# Patient Record
Sex: Female | Born: 1945
Health system: Southern US, Community
[De-identification: ages and names within clinical notes are randomized; demographics above are authoritative.]

## PROBLEM LIST (undated history)

## (undated) DIAGNOSIS — Z923 Personal history of irradiation: Secondary | ICD-10-CM

## (undated) DIAGNOSIS — C801 Malignant (primary) neoplasm, unspecified: Secondary | ICD-10-CM

## (undated) DIAGNOSIS — J449 Chronic obstructive pulmonary disease, unspecified: Secondary | ICD-10-CM

## (undated) DIAGNOSIS — I2510 Atherosclerotic heart disease of native coronary artery without angina pectoris: Secondary | ICD-10-CM

## (undated) DIAGNOSIS — C3490 Malignant neoplasm of unspecified part of unspecified bronchus or lung: Secondary | ICD-10-CM

## (undated) DIAGNOSIS — I10 Essential (primary) hypertension: Secondary | ICD-10-CM

## (undated) DIAGNOSIS — E039 Hypothyroidism, unspecified: Secondary | ICD-10-CM

## (undated) DIAGNOSIS — Z720 Tobacco use: Secondary | ICD-10-CM

## (undated) DIAGNOSIS — K219 Gastro-esophageal reflux disease without esophagitis: Secondary | ICD-10-CM

## (undated) DIAGNOSIS — E785 Hyperlipidemia, unspecified: Secondary | ICD-10-CM

## (undated) DIAGNOSIS — I219 Acute myocardial infarction, unspecified: Secondary | ICD-10-CM

## (undated) HISTORY — DX: Gastro-esophageal reflux disease without esophagitis: K21.9

## (undated) HISTORY — DX: Hyperlipidemia, unspecified: E78.5

## (undated) HISTORY — DX: Malignant (primary) neoplasm, unspecified: C80.1

## (undated) HISTORY — DX: Malignant neoplasm of unspecified part of unspecified bronchus or lung: C34.90

## (undated) HISTORY — DX: Acute myocardial infarction, unspecified: I21.9

## (undated) HISTORY — DX: Essential (primary) hypertension: I10

## (undated) HISTORY — DX: Tobacco use: Z72.0

## (undated) HISTORY — DX: Atherosclerotic heart disease of native coronary artery without angina pectoris: I25.10

---

## 2001-11-16 ENCOUNTER — Other Ambulatory Visit: Admission: RE | Admit: 2001-11-16 | Discharge: 2001-11-16 | Payer: Self-pay | Admitting: Obstetrics and Gynecology

## 2003-07-12 ENCOUNTER — Other Ambulatory Visit: Admission: RE | Admit: 2003-07-12 | Discharge: 2003-07-12 | Payer: Self-pay | Admitting: Obstetrics and Gynecology

## 2005-04-11 ENCOUNTER — Other Ambulatory Visit: Admission: RE | Admit: 2005-04-11 | Discharge: 2005-04-11 | Payer: Self-pay | Admitting: Obstetrics and Gynecology

## 2010-07-06 ENCOUNTER — Other Ambulatory Visit: Payer: Self-pay | Admitting: Thoracic Surgery (Cardiothoracic Vascular Surgery)

## 2010-07-06 ENCOUNTER — Inpatient Hospital Stay (HOSPITAL_COMMUNITY)
Admission: EM | Admit: 2010-07-06 | Discharge: 2010-07-12 | DRG: 546 | Disposition: A | Discharge status: Home / Self Care | Payer: BC Managed Care – PPO | Source: Ambulatory Visit | Attending: Thoracic Surgery (Cardiothoracic Vascular Surgery) | Admitting: Thoracic Surgery (Cardiothoracic Vascular Surgery)

## 2010-07-06 ENCOUNTER — Inpatient Hospital Stay (HOSPITAL_COMMUNITY): Payer: BC Managed Care – PPO

## 2010-07-06 ENCOUNTER — Emergency Department (HOSPITAL_COMMUNITY): Payer: BC Managed Care – PPO

## 2010-07-06 DIAGNOSIS — R0609 Other forms of dyspnea: Secondary | ICD-10-CM | POA: Diagnosis present

## 2010-07-06 DIAGNOSIS — I214 Non-ST elevation (NSTEMI) myocardial infarction: Principal | ICD-10-CM | POA: Diagnosis present

## 2010-07-06 DIAGNOSIS — E876 Hypokalemia: Secondary | ICD-10-CM | POA: Diagnosis not present

## 2010-07-06 DIAGNOSIS — E669 Obesity, unspecified: Secondary | ICD-10-CM | POA: Diagnosis present

## 2010-07-06 DIAGNOSIS — K219 Gastro-esophageal reflux disease without esophagitis: Secondary | ICD-10-CM | POA: Diagnosis present

## 2010-07-06 DIAGNOSIS — I252 Old myocardial infarction: Secondary | ICD-10-CM

## 2010-07-06 DIAGNOSIS — F17200 Nicotine dependence, unspecified, uncomplicated: Secondary | ICD-10-CM | POA: Diagnosis present

## 2010-07-06 DIAGNOSIS — Z7982 Long term (current) use of aspirin: Secondary | ICD-10-CM

## 2010-07-06 DIAGNOSIS — D62 Acute posthemorrhagic anemia: Secondary | ICD-10-CM | POA: Diagnosis not present

## 2010-07-06 DIAGNOSIS — I509 Heart failure, unspecified: Secondary | ICD-10-CM | POA: Diagnosis present

## 2010-07-06 DIAGNOSIS — R911 Solitary pulmonary nodule: Secondary | ICD-10-CM | POA: Diagnosis present

## 2010-07-06 DIAGNOSIS — R0989 Other specified symptoms and signs involving the circulatory and respiratory systems: Secondary | ICD-10-CM | POA: Diagnosis present

## 2010-07-06 DIAGNOSIS — E785 Hyperlipidemia, unspecified: Secondary | ICD-10-CM | POA: Diagnosis present

## 2010-07-06 DIAGNOSIS — I2510 Atherosclerotic heart disease of native coronary artery without angina pectoris: Secondary | ICD-10-CM | POA: Diagnosis present

## 2010-07-06 DIAGNOSIS — I5041 Acute combined systolic (congestive) and diastolic (congestive) heart failure: Secondary | ICD-10-CM | POA: Diagnosis not present

## 2010-07-06 HISTORY — PX: CARDIAC CATHETERIZATION: SHX172

## 2010-07-06 HISTORY — PX: TEE WITHOUT CARDIOVERSION: SHX5443

## 2010-07-06 LAB — POCT I-STAT 4, (NA,K, GLUC, HGB,HCT)
Glucose, Bld: 131 mg/dL — ABNORMAL HIGH (ref 70–99)
Glucose, Bld: 114 mg/dL — ABNORMAL HIGH (ref 70–99)
Glucose, Bld: 130 mg/dL — ABNORMAL HIGH (ref 70–99)
Glucose, Bld: 128 mg/dL — ABNORMAL HIGH (ref 70–99)
HCT: 24 % — ABNORMAL LOW (ref 36.0–46.0)
HCT: 29 % — ABNORMAL LOW (ref 36.0–46.0)
HCT: 23 % — ABNORMAL LOW (ref 36.0–46.0)
HCT: 20 % — ABNORMAL LOW (ref 36.0–46.0)
HCT: 39 % (ref 36.0–46.0)
Hemoglobin: 11.6 g/dL — ABNORMAL LOW (ref 12.0–15.0)
Hemoglobin: 9.9 g/dL — ABNORMAL LOW (ref 12.0–15.0)
Hemoglobin: 13.3 g/dL (ref 12.0–15.0)
Potassium: 4.5 mEq/L (ref 3.5–5.1)
Potassium: 4 mEq/L (ref 3.5–5.1)
Potassium: 5.3 mEq/L — ABNORMAL HIGH (ref 3.5–5.1)
Potassium: 5.1 mEq/L (ref 3.5–5.1)
Sodium: 137 mEq/L (ref 135–145)
Sodium: 137 mEq/L (ref 135–145)
Sodium: 134 mEq/L — ABNORMAL LOW (ref 135–145)

## 2010-07-06 LAB — POCT I-STAT 3, ART BLOOD GAS (G3+)
Acid-base deficit: 4 mmol/L — ABNORMAL HIGH (ref 0.0–2.0)
Acid-base deficit: 4 mmol/L — ABNORMAL HIGH (ref 0.0–2.0)
Bicarbonate: 22.3 mEq/L (ref 20.0–24.0)
Bicarbonate: 20.8 mEq/L (ref 20.0–24.0)
Bicarbonate: 23.2 mEq/L (ref 20.0–24.0)
O2 Saturation: 100 %
O2 Saturation: 98 %
TCO2: 26 mmol/L (ref 0–100)
TCO2: 22 mmol/L (ref 0–100)
TCO2: 22 mmol/L (ref 0–100)
TCO2: 25 mmol/L (ref 0–100)
pCO2 arterial: 44.5 mmHg (ref 35.0–45.0)
pCO2 arterial: 45.1 mmHg — ABNORMAL HIGH (ref 35.0–45.0)
pCO2 arterial: 51.2 mmHg — ABNORMAL HIGH (ref 35.0–45.0)
pH, Arterial: 7.37 (ref 7.350–7.400)
pH, Arterial: 7.327 — ABNORMAL LOW (ref 7.350–7.400)
pH, Arterial: 7.32 — ABNORMAL LOW (ref 7.350–7.400)
pH, Arterial: 7.274 — ABNORMAL LOW (ref 7.350–7.400)
pO2, Arterial: 65 mmHg — ABNORMAL LOW (ref 80.0–100.0)
pO2, Arterial: 103 mmHg — ABNORMAL HIGH (ref 80.0–100.0)
pO2, Arterial: 75 mmHg — ABNORMAL LOW (ref 80.0–100.0)
pO2, Arterial: 78 mmHg — ABNORMAL LOW (ref 80.0–100.0)

## 2010-07-06 LAB — CBC
HCT: 32.2 % — ABNORMAL LOW (ref 36.0–46.0)
Hemoglobin: 10.8 g/dL — ABNORMAL LOW (ref 12.0–15.0)
Hemoglobin: 12.7 g/dL (ref 12.0–15.0)
MCH: 29.1 pg (ref 26.0–34.0)
MCHC: 33.5 g/dL (ref 30.0–36.0)
MCHC: 33.5 g/dL (ref 30.0–36.0)
Platelets: 134 10*3/uL — ABNORMAL LOW (ref 150–400)
RBC: 4.35 MIL/uL (ref 3.87–5.11)
RDW: 13.9 % (ref 11.5–15.5)

## 2010-07-06 LAB — POCT I-STAT 3, VENOUS BLOOD GAS (G3P V)
Acid-base deficit: 4 mmol/L — ABNORMAL HIGH (ref 0.0–2.0)
Bicarbonate: 22.6 mEq/L (ref 20.0–24.0)
TCO2: 24 mmol/L (ref 0–100)

## 2010-07-06 LAB — CARDIAC PANEL(CRET KIN+CKTOT+MB+TROPI)
CK, MB: 4.1 ng/mL — ABNORMAL HIGH (ref 0.3–4.0)
Relative Index: INVALID (ref 0.0–2.5)
Total CK: 98 U/L (ref 7–177)

## 2010-07-06 LAB — HEMOGLOBIN AND HEMATOCRIT, BLOOD: HCT: 21.9 % — ABNORMAL LOW (ref 36.0–46.0)

## 2010-07-06 LAB — COMPREHENSIVE METABOLIC PANEL
ALT: 42 U/L — ABNORMAL HIGH (ref 0–35)
Albumin: 3 g/dL — ABNORMAL LOW (ref 3.5–5.2)
Alkaline Phosphatase: 75 U/L (ref 39–117)
Calcium: 8.6 mg/dL (ref 8.4–10.5)
Potassium: 3.9 mEq/L (ref 3.5–5.1)
Sodium: 135 mEq/L (ref 135–145)
Total Protein: 6.2 g/dL (ref 6.0–8.3)

## 2010-07-06 LAB — POCT I-STAT, CHEM 8
Calcium, Ion: 1.12 mmol/L (ref 1.12–1.32)
Chloride: 105 mEq/L (ref 96–112)
HCT: 39 % (ref 36.0–46.0)
TCO2: 22 mmol/L (ref 0–100)

## 2010-07-06 LAB — POCT CARDIAC MARKERS: Myoglobin, poc: 94.1 ng/mL (ref 12–200)

## 2010-07-06 LAB — DIFFERENTIAL
Eosinophils Absolute: 0 10*3/uL (ref 0.0–0.7)
Eosinophils Relative: 0 % (ref 0–5)
Monocytes Absolute: 1.7 10*3/uL — ABNORMAL HIGH (ref 0.1–1.0)
Neutrophils Relative %: 78 % — ABNORMAL HIGH (ref 43–77)

## 2010-07-06 LAB — PROTIME-INR
INR: 1.03 (ref 0.00–1.49)
INR: 1.52 — ABNORMAL HIGH (ref 0.00–1.49)
Prothrombin Time: 18.5 seconds — ABNORMAL HIGH (ref 11.6–15.2)

## 2010-07-07 ENCOUNTER — Inpatient Hospital Stay (HOSPITAL_COMMUNITY): Payer: BC Managed Care – PPO

## 2010-07-07 LAB — GLUCOSE, CAPILLARY
Glucose-Capillary: 113 mg/dL — ABNORMAL HIGH (ref 70–99)
Glucose-Capillary: 117 mg/dL — ABNORMAL HIGH (ref 70–99)
Glucose-Capillary: 74 mg/dL (ref 70–99)
Glucose-Capillary: 83 mg/dL (ref 70–99)
Glucose-Capillary: 94 mg/dL (ref 70–99)
Glucose-Capillary: 95 mg/dL (ref 70–99)
Glucose-Capillary: 156 mg/dL — ABNORMAL HIGH (ref 70–99)

## 2010-07-07 LAB — BASIC METABOLIC PANEL
BUN: 17 mg/dL (ref 6–23)
CO2: 24 mEq/L (ref 19–32)
Chloride: 108 mEq/L (ref 96–112)
GFR calc non Af Amer: >60 mL/min (ref >60)
Glucose, Bld: 103 mg/dL — ABNORMAL HIGH (ref 70–99)
Potassium: 4.3 mEq/L (ref 3.5–5.1)
Sodium: 139 mEq/L (ref 135–145)

## 2010-07-07 LAB — POCT I-STAT 3, ART BLOOD GAS (G3+)
Acid-base deficit: 3 mmol/L — ABNORMAL HIGH (ref 0.0–2.0)
Acid-base deficit: 1 mmol/L (ref 0.0–2.0)
Bicarbonate: 22.5 mEq/L (ref 20.0–24.0)
Bicarbonate: 22.8 mEq/L (ref 20.0–24.0)
O2 Saturation: 93 %
O2 Saturation: 91 %
Patient temperature: 38.3
pCO2 arterial: 40.9 mmHg (ref 35.0–45.0)
pO2, Arterial: 75 mmHg — ABNORMAL LOW (ref 80.0–100.0)

## 2010-07-07 LAB — POCT I-STAT, CHEM 8
BUN: 22 mg/dL (ref 6–23)
Calcium, Ion: 1.14 mmol/L (ref 1.12–1.32)
Chloride: 105 mEq/L (ref 96–112)
Potassium: 4 mEq/L (ref 3.5–5.1)
Sodium: 136 mEq/L (ref 135–145)

## 2010-07-07 LAB — CREATININE, SERUM
Creatinine, Ser: 1.21 mg/dL — ABNORMAL HIGH (ref 0.4–1.2)
GFR calc non Af Amer: 45 mL/min — ABNORMAL LOW (ref >60)

## 2010-07-07 LAB — CBC
HCT: 34.2 % — ABNORMAL LOW (ref 36.0–46.0)
HCT: 33.5 % — ABNORMAL LOW (ref 36.0–46.0)
Hemoglobin: 11.8 g/dL — ABNORMAL LOW (ref 12.0–15.0)
Hemoglobin: 11.1 g/dL — ABNORMAL LOW (ref 12.0–15.0)
MCV: 86.4 fL (ref 78.0–100.0)
MCV: 87.2 fL (ref 78.0–100.0)
RBC: 3.84 MIL/uL — ABNORMAL LOW (ref 3.87–5.11)
RDW: 14.6 % (ref 11.5–15.5)
WBC: 19.5 10*3/uL — ABNORMAL HIGH (ref 4.0–10.5)
WBC: 20.2 10*3/uL — ABNORMAL HIGH (ref 4.0–10.5)

## 2010-07-07 LAB — POCT ACTIVATED CLOTTING TIME: Activated Clotting Time: 140 seconds

## 2010-07-07 LAB — MAGNESIUM: Magnesium: 2.9 mg/dL — ABNORMAL HIGH (ref 1.5–2.5)

## 2010-07-08 ENCOUNTER — Inpatient Hospital Stay (HOSPITAL_COMMUNITY): Payer: BC Managed Care – PPO

## 2010-07-08 LAB — GLUCOSE, CAPILLARY
Glucose-Capillary: 118 mg/dL — ABNORMAL HIGH (ref 70–99)
Glucose-Capillary: 128 mg/dL — ABNORMAL HIGH (ref 70–99)
Glucose-Capillary: 114 mg/dL — ABNORMAL HIGH (ref 70–99)
Glucose-Capillary: 135 mg/dL — ABNORMAL HIGH (ref 70–99)

## 2010-07-08 LAB — BASIC METABOLIC PANEL
BUN: 23 mg/dL (ref 6–23)
Chloride: 97 mEq/L (ref 96–112)
GFR calc non Af Amer: 49 mL/min — ABNORMAL LOW (ref >60)
Glucose, Bld: 214 mg/dL — ABNORMAL HIGH (ref 70–99)
Potassium: 3.9 mEq/L (ref 3.5–5.1)
Sodium: 131 mEq/L — ABNORMAL LOW (ref 135–145)

## 2010-07-08 LAB — CBC
HCT: 30.6 % — ABNORMAL LOW (ref 36.0–46.0)
MCV: 87.4 fL (ref 78.0–100.0)
RBC: 3.5 MIL/uL — ABNORMAL LOW (ref 3.87–5.11)
WBC: 21 10*3/uL — ABNORMAL HIGH (ref 4.0–10.5)

## 2010-07-09 ENCOUNTER — Inpatient Hospital Stay (HOSPITAL_COMMUNITY): Payer: BC Managed Care – PPO

## 2010-07-09 LAB — CROSSMATCH
Antibody Screen: NEGATIVE
Unit division: 0
Unit division: 0
Unit division: 0

## 2010-07-09 LAB — COMPREHENSIVE METABOLIC PANEL
Albumin: 2.7 g/dL — ABNORMAL LOW (ref 3.5–5.2)
Alkaline Phosphatase: 148 U/L — ABNORMAL HIGH (ref 39–117)
BUN: 20 mg/dL (ref 6–23)
CO2: 28 mEq/L (ref 19–32)
Chloride: 98 mEq/L (ref 96–112)
GFR calc non Af Amer: 55 mL/min — ABNORMAL LOW (ref >60)
Glucose, Bld: 98 mg/dL (ref 70–99)
Potassium: 3.3 mEq/L — ABNORMAL LOW (ref 3.5–5.1)
Total Bilirubin: 0.5 mg/dL (ref 0.3–1.2)

## 2010-07-09 LAB — CBC
HCT: 34.3 % — ABNORMAL LOW (ref 36.0–46.0)
Hemoglobin: 11.3 g/dL — ABNORMAL LOW (ref 12.0–15.0)
MCH: 28.5 pg (ref 26.0–34.0)
MCV: 86.6 fL (ref 78.0–100.0)
Platelets: 158 10*3/uL (ref 150–400)
RBC: 3.96 MIL/uL (ref 3.87–5.11)
WBC: 17.3 10*3/uL — ABNORMAL HIGH (ref 4.0–10.5)

## 2010-07-09 LAB — GLUCOSE, CAPILLARY
Glucose-Capillary: 104 mg/dL — ABNORMAL HIGH (ref 70–99)
Glucose-Capillary: 156 mg/dL — ABNORMAL HIGH (ref 70–99)

## 2010-07-09 LAB — POCT I-STAT 4, (NA,K, GLUC, HGB,HCT): HCT: 35 % — ABNORMAL LOW (ref 36.0–46.0)

## 2010-07-10 LAB — BODY FLUID CULTURE

## 2010-07-10 LAB — CBC
MCH: 28.7 pg (ref 26.0–34.0)
MCHC: 32.9 g/dL (ref 30.0–36.0)
Platelets: 189 10*3/uL (ref 150–400)
RBC: 3.8 MIL/uL — ABNORMAL LOW (ref 3.87–5.11)

## 2010-07-10 LAB — GLUCOSE, CAPILLARY
Glucose-Capillary: 100 mg/dL — ABNORMAL HIGH (ref 70–99)
Glucose-Capillary: 104 mg/dL — ABNORMAL HIGH (ref 70–99)
Glucose-Capillary: 108 mg/dL — ABNORMAL HIGH (ref 70–99)

## 2010-07-10 LAB — BASIC METABOLIC PANEL
Calcium: 8.6 mg/dL (ref 8.4–10.5)
Chloride: 99 mEq/L (ref 96–112)
Creatinine, Ser: 0.99 mg/dL (ref 0.4–1.2)
GFR calc Af Amer: >60 mL/min (ref >60)

## 2010-07-10 LAB — PRO B NATRIURETIC PEPTIDE: Pro B Natriuretic peptide (BNP): 10535 pg/mL — ABNORMAL HIGH (ref 0–125)

## 2010-07-11 ENCOUNTER — Inpatient Hospital Stay (HOSPITAL_COMMUNITY): Payer: BC Managed Care – PPO

## 2010-07-11 HISTORY — PX: CORONARY ARTERY BYPASS GRAFT: SHX141

## 2010-07-11 LAB — CBC
MCHC: 32 g/dL (ref 30.0–36.0)
RDW: 14.3 % (ref 11.5–15.5)

## 2010-07-11 LAB — BASIC METABOLIC PANEL
Calcium: 9.4 mg/dL (ref 8.4–10.5)
GFR calc Af Amer: >60 mL/min (ref >60)
GFR calc non Af Amer: 57 mL/min — ABNORMAL LOW (ref >60)
Sodium: 141 mEq/L (ref 135–145)

## 2010-07-11 NOTE — Cardiovascular Report (Signed)
NAMEVIVIKA, [name]             ACCOUNT NO.:  [id number]  MEDICAL RECORD NO.:  [id number]           PATIENT TYPE:  I  LOCATION:  2307                         FACILITY:  [facility]  PHYSICIAN:  Nicki Guadalajara, M.D.     DATE OF BIRTH:  1945-10-19  DATE OF PROCEDURE:  07/06/2010 DATE OF DISCHARGE:                           CARDIAC CATHETERIZATION   INDICATIONS:  Ms. [name] is a 65 year old female who has a history of prior myocardial infarction in 1996 at which time she was treated with PTCA.  I do not have records available as to specifics regarding her catheterization findings and vessel involved.  Apparently, she had seen Dr. Alanda Amass just intermittently since then and essentially, has not seen for Cardiology followup.  She does have a history of hypertension, hyperlipidemia, tobacco use, and has apparently last evening developed increasing shortness of breath.  She ultimately then developed scapular pain, some back discomfort leading to presentation to [facility].  She was felt to be in a with have tonight of mild CHF symptomatology.  She is ruled in with a troponin of 0.77.  Initial CPK-MB was 5.8.  Due to worrisome symptoms of ischemia- mediated LV dysfunction.  She is now taken to the cardiac catheterization laboratory.  PROCEDURE:  After premedication with Versed 1 mg plus fentanyl 25 mcg intravenously, the patient prepped and draped in usual fashion.  Her right femoral artery was punctured anteriorly and a 5-French sheath was inserted without difficulty.  Diagnostic catheterization was done utilizing 5-French Judkins for left and right coronary catheters.  The right catheter was also used for selective angiography into the left subclavian internal mammary artery system.  A pigtail catheter was used for left ventriculography as well as distal aortography.  The arterial sheath was sutured in place.  Due to the patient's high risk critical multivessel anatomy  presently made for the patient to proceed to emergent CABG revascularization surgery today with Dr. Dorris Fetch. Consequently, she was started back on heparin with the sheath sutured in place with plans for the patient to go to the operating room for treatment.  HEMODYNAMIC DATA:  Central aortic pressure is 100/63.  Left ventricle pressure 100/17.  Post A-wave 29.  ANGIOGRAPHIC DATA:  Left main coronary artery was short and bifurcated into the LAD and left circumflex system.  There was a high-grade subtotal left main equivalent disease with 99% stenosis of the LAD proximal to the diagonal vessel.  The remainder of the LAD was a moderate-sized vessel, which extended to the apex.  The very proximal portion of the circumflex was 99% stenosed.  There was collateralization to the distal RCA via the left coronary injection supplying the PLA and PDA and going back up to the acute margin.  The native right coronary artery was completely occluded at its origin, and was totally supplied by the left collaterals.  The left subclavian internal mammary artery were normal and suitable for CBG revascularization surgery.  RAO ventriculography revealed moderate global LV dysfunction with an ejection fraction of approximately 35%.  There was severe hypocontractility involving the middistal anterolateral wall extending around the apex with distal inferior hypocontractility.  Distal aortography revealed 20% smooth narrowing in the left renal artery without significant aortoiliac disease.  IMPRESSION: 1. Severe critical three-vessel coronary artery disease with     subtotaled left milliequivalents with 99% proximal LAD stenosis and     circumflex stenoses as well as total occlusion of the RCA at the     ostium with left-to-right collaterals. 2. Moderately severe left ventricular dysfunction with severe     hypocontractility in the middistal anterolateral wall apex and the     distal inferior wall  with an ejection fraction acutely of     approximately 35%. 3. Smooth 20% narrowing in the left renal artery.  RECOMMENDATIONS:  Emergent CABG revascularization surgery.  Dr. Dorris Fetch was consulted and has seen the patient and agrees with the above plan, such that the patient will be taken to the operating room for emergent bypass surgery.          ______________________________ Nicki Guadalajara, M.D.     TK/MEDQ  D:  07/06/2010  T:  07/07/2010  Job:  [id number]  cc:   [facility]  Electronically Signed by Nicki Guadalajara M.D. on 07/11/2010 02:15:33 PM

## 2010-07-12 LAB — BASIC METABOLIC PANEL
BUN: 15 mg/dL (ref 6–23)
GFR calc non Af Amer: >60 mL/min (ref >60)
Potassium: 3.8 mEq/L (ref 3.5–5.1)
Sodium: 139 mEq/L (ref 135–145)

## 2010-07-17 NOTE — H&P (Signed)
NAMEKEIRSTYN, AYDT             ACCOUNT NO.:  [id number]  MEDICAL RECORD NO.:  [id number]           PATIENT TYPE:  E  LOCATION:  [facility]                         FACILITY:  [facility]  PHYSICIAN:  [place] Shakthi Scipio, MD         DATE OF BIRTH:  [birth year]  DATE OF ADMISSION:  07/06/2010 DATE OF DISCHARGE:                             HISTORY & PHYSICAL   CHIEF COMPLAINT:  Chest pain.  HISTORY OF PRESENT ILLNESS:  Ms. Courtright is a 65 year old white female with history of myocardial infarction as well as dyslipidemia and tobacco abuse who presents to the emergency department tonight with complaints of shortness of breath.  She states that throughout the day she has noted some dyspnea on exertion, however, she awoke around 6 p.m., very short of breath.  She has been unable to lie flat, complaining of orthopnea as well as PND.  She denies any chest pain, however, she does complain of pain between her shoulder blades.  She states this is different than her MI in the past and she did have left anterior chest discomfort with that.  On arrival to the emergency department, she was short of breath with blood pressure 140/80.  EKG reveals sinus tachycardia with P-wave changes laterally.  Her initial point-of-care markers revealed a myoglobin of 94.1, CK-MB of 5.8, and troponin of 0.77.  Her chest x-ray revealed bibasilar opacity and cardiomegaly, but no overt pulmonary edema, however, her BNP was elevated at 4200.  Her D-dimer was 1.01.  She has been started on IV nitroglycerin as well as heparin and has been given IV Lasix and morphine.  She continues to experience shortness of breath.  PAST MEDICAL HISTORY: 1. Coronary artery disease.     a.     Status post myocardial infarction in 1996, no records      available. 2. Very limited followup with Dr. Alanda Amass, has not been seen more     than 5 years per the patient. 3. Tobacco abuse. 4. Dyslipidemia. 5. Hypertension, untreated. 6. Gastroesophageal  reflux disease.  FAMILY HISTORY:  Positive for coronary artery disease.  SOCIAL HISTORY:  She is married.  She smokes a pack of cigarettes a day. No alcohol.  ALLERGIES:  None known.  CURRENT MEDICATIONS: 1. Aspirin 81 mg daily. 2. Aleve p.r.n. 3. Vitamin for cholesterol.  REVIEW OF SYSTEMS:  As per HPI, otherwise negative.  PHYSICAL EXAMINATION:  VITAL SIGNS:  Blood pressure is 112/74, pulse is 94, respirations 24, and pulse ox is 94%. GENERAL:  This is a 65 year old white female with mild respiratory distress. HEENT:  Pupils are equal and reactive to light and accommodation. Extraocular movements are intact. NECK:  Supple with JVD at 6 cm.  No carotid bruits.  No thyromegaly. CARDIOVASCULAR:  Regular rate and rhythm, tachycardic, S1 and S2, without appreciable murmur, gallop, or rub. LUNGS:  Diminished bilaterally with scattered wheezes. ABDOMEN:  Soft and nontender without hepatosplenomegaly or masses. EXTREMITIES:  Radial, femoral, and dorsal pedal arteries are present. There is no lower extremity edema.  No clubbing, cyanosis, or ulcers. NEUROLOGIC:  Oriented to person, place, and time.  Normal mood and  affect.  LABORATORY DATA:  White blood cell count is elevated at 16.8.  BNP is 4235.  D-dimer is elevated at 1.01.  Cardiac markers, myoglobin is 94.1, CK-MB is 5.8, and troponin is 0.77.  Glucose is 141, BUN is 17, and creatinine is 0.90.  IMPRESSION: 1. Non-ST-segment elevation myocardial infarction/acute coronary     syndrome. 2. Shortness of breath. 3. History of coronary artery disease. 4. Dyslipidemia. 5. Ongoing tobacco use. 6. Hypertension. 7. Gastroesophageal reflux disease.  PLAN:  We will admit to the CCU, continue IV nitro and heparin and titrate the nitro as her blood pressures tolerate.  She has been started on IV Lasix.  We will continue with 40 mg IV twice daily and also she has been given morphine which she can have hourly as needed.  We  will tentatively schedule her for cardiac catheterization, possible PCI today, however, that will be dependent on how well she diureses this morning and if she will be able to lie flat during the procedure.  This has been explained to her and her husband and she has no questions currently.    ______________________________ [employer]a [employer], NP   ______________________________ [place] Leita Lindbloom, MD    LS/MEDQ  D:  07/06/2010  T:  07/06/2010  Job:  [id number]  cc:   [facility]  Electronically Signed by Charmian Muff NP on 07/16/2010 10:08:04 PM Electronically Signed by Kirtland Bouchard. Fidencio Duddy M.D. on 07/17/2010 11:50:51 AM

## 2010-07-18 ENCOUNTER — Other Ambulatory Visit: Payer: Self-pay | Admitting: Thoracic Surgery (Cardiothoracic Vascular Surgery)

## 2010-07-18 DIAGNOSIS — R911 Solitary pulmonary nodule: Secondary | ICD-10-CM

## 2010-07-18 NOTE — Op Note (Signed)
[name], [name]             ACCOUNT NO.:  [id number]  MEDICAL RECORD NO.:  [id number]           PATIENT TYPE:  LOCATION:                                 FACILITY:  PHYSICIAN:  Salvatore Decent. Dorris Fetch, M.D.DATE OF BIRTH:  1945-11-17  DATE OF PROCEDURE: DATE OF DISCHARGE:                              OPERATIVE REPORT   PREOPERATIVE DIAGNOSIS:  Severe three-vessel coronary disease with non-Q- wave myocardial infarction.  POSTOPERATIVE DIAGNOSIS:  Severe three-vessel coronary disease with non- Q-wave myocardial infarction plus left upper lobe nodule.  PROCEDURE:  Emergency median sternotomy, extracorporeal circulation, coronary bypass grafting x3 (left internal mammary artery to LAD, saphenous vein graft to posterior descending, saphenous vein graft to obtuse marginal-2).  SURGEON:  Salvatore Decent. Dorris Fetch, MD  ANESTHESIA:  General.  FINDINGS:  Severe left ventricular dysfunction, good-quality conduits, good-quality targets, moderate mitral regurgitation by TEE, 1.5- to 2-cm left upper lobe nodule.  CLINICAL NOTE:  Ms. Dabbs is a 65 year old woman with history of coronary disease and previous PTCA in 1996.  She presented with unstable anginal symptoms and congestive heart failure.  She underwent cardiac catheterization on June 06, 2010, by Dr. Nicki Guadalajara where she was found to have a totally occluded right coronary artery with 99% stenoses of the proximal LAD and circumflex.  Ejection fraction was 35% to 40% with anterior apical severe hypokinesis.  LVEDP was elevated.  The patient was advised to undergo emergency coronary bypass grafting given the critical nature of her coronary disease.  The indications, risks, benefits, and alternatives were discussed in detail with the patient. She understood and accepted the risks and agreed to proceed.  The operating room was notified and the patient was taken urgently.  OPERATIVE REPORT:  Ms. Arechiga was brought urgently to  the operating room on Jul 06, 2010.  There, the Anesthesia Service placed a Swan-Ganz catheter, established intravenous access, and placed an arterial blood pressure monitoring line.  Intravenous antibiotics were administered. The patient was anesthetized and intubated.  Transesophageal echocardiography was performed.  It revealed moderately severe left ventricular dysfunction with ejection fraction estimated at 35% and moderate mitral regurgitation.  It was felt in this instance given the severity of the patient's coronary disease, urgent presentation that documented mitral valve intervention was not indicated at this time. While the transesophageal echocardiography was being performed, the chest, abdomen, legs were prepped and draped in the usual sterile fashion.  Incision was made in the medial aspect of the right leg at the level of the knee.  The greater saphenous vein was then harvested endoscopically from the right leg.  It was a good-quality conduit. Simultaneously, a median sternotomy was performed and the left internal mammary artery was harvested using standard technique.  Heparin 2000 units was administered during the vessel harvest.  The remainder of the full heparin dose was given prior to opening the pericardium.  It should be noted that during takedown of the left internal mammary artery a nodule was palpated in the left upper lobe on its lateral aspect.  This was approximately 1.5- to 2-cm nodule, it was firm and suspicious for primary lung cancer but was not  amenable to wedge resection given its location and the critical nature of the patient's presentation and this will be noted and worked up at a later time.  The pericardium was opened.  The ascending aorta was inspected and palpated.  The aorta was cannulated via concentric 2-0 Ethibond pledgeted pursestring sutures.  A dual-stage venous cannula was placed via pursestring suture in the right atrial appendage.   Cardiopulmonary bypass was instituted and the patient was cooled to 32-degree Celsius. The coronary arteries were inspected and anastomotic sites were chosen. The conduits were inspected and cut to length.  A foam pad was placed in the pericardium to insulate the heart and protect the left phrenic nerve.  A temperature probe was placed in the myocardial septum and a cardioplegic cannula was placed in the ascending aorta.  Retrograde cardioplegic cannula was placed via pursestring suture in the right atrium and draped in the coronary sinus.  The aorta was cross-clamped.  The left ventricle was emptied via the aortic root vent.  Cardiac arrest then was achieved with combination of cold antegrade and retrograde blood cardioplegia and topical iced saline.  After achieving complete diastolic arrest and adequate myocardial septal cooling to 10 degree Celsius, the following distal anastomoses were performed.  First, a reverse saphenous vein graft was placed end-to-side to the posterior descending, it was a 1.5-mm fair-quality target.  The vein was of good quality.  It was anastomosed end-to-side with a running 7-0 Prolene suture.  At completion of each anastomosis, probes were passed proximally and distally to ensure patency prior to tying the sutures. Cardioplegia was administered at the completion of each vein graft to assess flow and hemostasis as well as provide perfusion to the compromised area.  Next, a reverse saphenous vein graft was placed end-to-side to the obtuse marginal.  This was a 1.5-mm good-quality target.  Vein was good quality vessel.  The anastomosis was performed end-to-side with a running 7-0 Prolene suture.  At completion of the anastomosis, there was good flow and good hemostasis.  Next, the left internal mammary artery was brought through a window in the pericardium.  The distal end was beveled.  It was then anastomosed end-to-side to the distal LAD.  The LAD was a  1.5-mm good-quality target.  The mammary was a 1.5-mm good-quality conduit.  The anastomosis was performed end-to-side with a running 8-0 Prolene suture.  At the completion of the anastomosis, the bulldog clamps were briefly removed from the mammary artery.  Immediate and rapid septal rewarming was noted.  The bulldog clamps were placed and additional cardioplegia was administered.  Rewarming was initiated.  The vein grafts were cut to length.  The cardioplegic cannula was removed from the ascending aorta.  The proximal vein graft anastomoses were performed to 4.5-mm punch aortotomies with running 6-0 Prolene sutures.  At the completion of the final proximal anastomosis, the patient was placed in Trendelenburg position.  Lidocaine was administered.  A warm dose of retrograde cardioplegia was administered. The heart and aortic root were deaired.  The retrograde infusion was stopped.  The bulldog clamp was removed from the left mammary artery. After complete de-airing, the aortic crossclamp was removed.  Total cross-clamp time was 58 minutes.  While rewarming was completed, all proximal and distal anastomoses were inspected for hemostasis.  Epicardial pacing wires were placed on the right ventricle and right atrium.  When the patient had rewarmed to a core temperature of 37 degrees Celsius, DDD pacing was initiated.  A dopamine infusion was  initiated.  On the first attempt to wean from cardiopulmonary bypass, the patient's blood pressure remained relatively low.  Cardiac indexes were suboptimal.  The patient was placed back on cardiopulmonary bypass.  The heart was allowed to rest.  A milrinone infusion was initiated.  The patient weaned easily from bypass on the second attempt.  The total bypass time was 115 minutes.  Postbypass transesophageal echocardiography revealed no significant change in left ventricular function.  There was no change in the 2-3+ mitral regurgitation.  The  patient remained hemodynamically stable thereafter. A test dose of protamine was administered and was well tolerated.  The atrial and aortic cannulae were removed.  The remainder of the protamine was administered without incident.  The chest was irrigated with warm saline.  Hemostasis was achieved.  Left pleural and two mediastinal chest tubes were placed through separate subcostal incisions.  The sternum was closed with interrupted heavy gauge stainless steel wires. The pectoralis fascia, subcutaneous tissue, and skin were closed in standard fashion.  All sponge, needle, and instrument counts were correct at the end of the procedure.  The patient was taken from the operating room to the surgical intensive care unit in fair condition.     Salvatore Decent Dorris Fetch, M.D.     SCH/MEDQ  D:  07/11/2010  T:  07/12/2010  Job:  [id number]  cc:   Nicki Guadalajara, M.D.  Electronically Signed by Charlett Lango M.D. on 07/18/2010 03:35:22 PM

## 2010-07-18 NOTE — Discharge Summary (Signed)
[name], [name]             ACCOUNT NO.:  [id number]  MEDICAL RECORD NO.:  [id number]           PATIENT TYPE:  LOCATION:                                 FACILITY:  PHYSICIAN:  Salvatore Decent. Dorris Fetch, M.D.DATE OF BIRTH:  1945-06-25  DATE OF ADMISSION: DATE OF DISCHARGE:                              DISCHARGE SUMMARY   FINAL DIAGNOSIS:  Critical three-vessel coronary artery disease.  IN-HOSPITAL DIAGNOSES: 1. Left upper lobe nodule. 2. Acute blood loss anemia postoperatively. 3. Volume overload postoperatively. 4. Acute systolic and diastolic heart failure.  SECONDARY DIAGNOSES: 1. History of coronary artery disease, status post myocardial     infarction in 1996. 2. Tobacco abuse. 3. Dyslipidemia. 4. Hypertension, treated. 5. Gastroesophageal reflux disease.  IN-HOSPITAL OPERATIONS AND PROCEDURES: 1. Cardiac catheterization done by Dr. Tresa Endo on Jul 06, 2010. 2. Intraoperative transesophageal echocardiogram on Jul 06, 2010, by     Dr. Randa Evens. 3. Emergency coronary artery bypass grafting x3 using a left internal     mammary artery graft to left anterior descending, saphenous vein     graft to second branch of obtuse marginal, saphenous vein graft to     posterior descending artery.  Right endovein harvesting done.  HISTORY AND PHYSICAL AND HOSPITAL COURSE:  The patient is a 65 year old female with history of coronary artery disease, history of myocardial infarction, and previous angioplasty by Dr. Alanda Amass in 1996.  The patient had intermittent followup and has been lost to followup recently.  She presented to the emergency room with complaints of shortness of breath.  She was unable to lie flat.  She denied chest pain, but complained of pain between her shoulder blades association with her shortness of breath.  In the emergency room, the patient ruled in for myocardial infarction with an EKG showing anterior and inferior infarct.  The patient was started on IV  nitroglycerin and heparin and Cardiology was consulted.  For further details of the patient's past medical history and physical exam, please see dictated H and P.  The patient was admitted to [facility] on Jul 06, 2010.  She was scheduled to undergo cardiac catheterization that day.  She underwent cardiac catheterization by Dr. Tresa Endo on Jul 06, 2010, which showed critical three-vessel disease was totally occluded right coronary artery filling via collaterals.  There was 99% stenosis of the LAD, circumflex, proximal.  Ejection fraction 40% with anteroapical hypokinesis.  Due to the patient's significant critical three-vessel coronary artery disease, it is felt that she should undergo emergency coronary artery bypass grafting.  Dr. Dorris Fetch was consulted.  Dr. Dorris Fetch saw and evaluated the patient.  He discussed with the patient undergoing coronary artery bypass grafting.  He discussed risks and benefits with the patient.  The patient acknowledged understanding and agreed to proceed.  The patient was taken to the operating room emergently on Jul 06, 2010.  The patient underwent emergency coronary artery bypass grafting x3 using left internal mammary artery graft to left anterior descending, saphenous vein graft to second obtuse marginal artery, saphenous vein graft to posterior descending.  Endovein harvesting of the right thigh was done.  The patient tolerated  this procedure and was transferred to the Intensive Care Unit in stable condition.  Postoperatively, the patient was noted to be hemodynamically stable.  She remained on the ventilator overnight.  She was able to be weaned and extubated on postop day #1.  Postextubation, the patient was noted to be alert and oriented x4, neuro intact.  The patient's blood pressure was stable on dopamine, milrinone, and Neo-Synephrine.  She was in normal sinus rhythm.  These drips were eventually able to be weaned and discontinued.   The patient's blood pressure remained stable.  She was eventually started on low-dose beta-blocker as well as ACE inhibitor.  Blood pressure currently well controlled.  During her postoperative course, she has remained in normal sinus rhythm.  Postoperative chest x-ray obtained noted to be stable with bilateral atelectasis.  The patient had minimal drainage from chest tubes and chest tubes were discontinued in routine fashion.  On followup chest x-ray, the patient continued to have atelectasis and bilateral pleural effusions.  She had been started on daily diuretics.  Most recent chest x-ray showed improving bilateral pleural effusions with basilar atelectasis.  On the patient's chest x-ray, it was noted that she had a left upper/mid lung nodule that was suspicious.  Dr. Dorris Fetch did discuss this with the patient and it was felt that she would require PET scan as well as CT scan and further workup as an outpatient in the next couple of weeks.  During this time, the patient was encouraged to use incentive spirometer.  She has been able to be weaned off oxygen with O2 saturations maintaining greater than 90% on room air.  During the patient's postoperative course, her hemoglobin and hematocrit were followed closely.  She did develop acute blood loss anemia.  The patient's hemoglobin/hematocrit remained stable postoperatively and she did not require any transfusions postoperatively.  As stated above, she did have some pleural effusions as well as volume overload and acute systolic and diastolic heart failure.  The patient was initially started on IV Lasix when blood pressure allows.  The IV Lasix were switched over to p.o. Lasix b.i.d. BNP was obtained and showed to be 10,535.  We will continue to monitor. Plan to continue diuretics.  The patient was transferred out of the Surgical Intensive Care Unit to 2000 on postop day #4.  While in the unit and on the floor, the patient was up  ambulating with assistance.  She is tolerating diet well.  No nausea or vomiting noted.  All incisions were clean, dry, and intact and healing well.  Most recently, the patient was noted to be afebrile in normal sinus rhythm.  Blood pressure stable.  O2 sat was greater than 90% on room air.  Most recent lab work shows sodium of 141, potassium 4.6, chloride 190, bicarbonate 33, BUN of 18, creatinine 0.99, and glucose 96.  White blood cell count 11.3, hemoglobin of 11.5, hematocrit 35.9, and platelet count 253.  The patient is tentatively ready for discharge to home in the a.m. pending she remains stable.  FOLLOWUP APPOINTMENTS:  A followup appointment has been arranged with Dr. Dorris Fetch on August 02, 2010, at 12:15 p.m.  The patient will need to obtain PA and lateral chest x-ray 45 minutes prior to this appointment.  Our office will contact patient with an appointment for a CT scan of the chest and PET scan.  She will need to follow up with Dr. Tresa Endo in 2 weeks.  She will need to contact his office to make  these arrangements.  ACTIVITY:  The patient is instructed no driving until released to do so, no heavy lifting over 10 pounds.  She is told to ambulate 3-4 times per day, progress as tolerated and continue her breathing exercises.  INCISIONAL CARE:  The patient is told to shower washing her incisions using soap and water.  She is to contact the office if she develops any drainage or opening from any of her incision sites.  DIET:  The patient is educated on diet to be low fat and low salt.  DISCHARGE MEDICATIONS: 1. Lasix 40 mg daily. 2. Guaifenesin 600 mg b.i.d. 3. Combivent inhaler 2 puffs q.6 h. p.r.n. 4. Lisinopril 10 mg daily. 5. Toprol-XL 25 mg daily. 6. Oxycodone 5 mg 1-2 tablets q.3 h. p.r.n. pain. 7. Potassium chloride 20 mEq daily. 8. Crestor 40 mg daily. 9. Aspirin 325 mg daily. 10.Vitamin for cholesterol daily.     Sol Blazing,  PA   ______________________________ Salvatore Decent Dorris Fetch, M.D.    KMD/MEDQ  D:  07/11/2010  T:  07/12/2010  Job:  [id number]  cc:   Nicki Guadalajara, M.D.  Electronically Signed by Cameron Proud PA on 07/13/2010 01:21:30 PM Electronically Signed by Charlett Lango M.D. on 07/18/2010 03:35:20 PM

## 2010-07-18 NOTE — Consult Note (Signed)
[name], [name]             ACCOUNT NO.:  [id number]  MEDICAL RECORD NO.:  [id number]           PATIENT TYPE:  I  LOCATION:  2307                         FACILITY:  [facility]  PHYSICIAN:  Salvatore Decent. Dorris Fetch, M.D.DATE OF BIRTH:  16-Nov-1945  DATE OF CONSULTATION:  07/06/2010 DATE OF DISCHARGE:                                CONSULTATION   REFERRING PHYSICIAN:  Nicki Guadalajara, MD  REASON FOR CONSULTATION:  Critical three-vessel disease.  HISTORY OF PRESENT ILLNESS:  [name] is a 65 year old woman with a history of coronary artery disease with previous angioplasty by [name] in 1996.  She had intermittent followup, but has been lost to follow up recently.  She presented to the emergency room last night with complaints of shortness of breath she has been having some after the exertion, however, at 6 p.m. last night, she was very short of breath and was unable to lie flat.  She denied chest pain, but does complain of pain between her shoulder blades in association with her shortness of breath.  This felt different to her from her MI in the past; however, given her history she was appropriately admitted.  She ruled in for myocardial infarction.  EKG showed anterior and inferior infarcts, age undetermined.  She was started on IV nitroglycerin with heparin, had stabilization of her symptoms.  She was taken to catheterization today by Dr. Nicki Zimmerman, where she was found to have critical 3-vessel disease with totally occluded right coronary artery filled via collaterals.  There was 99% stenosis of the LAD, circumflex, proximal. Ejection fraction was 40% with anteroapical hypokinesis.  The patient currently is symptom free.  LVEDP was elevated.  PAST MEDICAL HISTORY:  Significant for coronary artery disease in 1996, hypertension, dyslipidemia, tobacco abuse, gastroesophageal reflux.  MEDICATIONS ON ADMISSION: 1. Aspirin 81 mg daily. 2. Vitamins that she takes for  cholesterol. 3. Aleve p.r.n.  ALLERGIES:  She has no known drug allergies.  FAMILY HISTORY:  Positive for coronary artery disease.  SOCIAL HISTORY:  She is married.  She smokes a pack of cigarettes a day.  REVIEW OF SYSTEMS:  Limited due to the urgency of the procedure. She denies stroke or TIA symptoms.  No history of excessive bleeding or bruising, no claudication.  All other systems negative.  PHYSICAL EXAMINATION:  GENERAL:  [name] is an obese 65 year old woman, in no acute distress. VITAL SIGNS:  Her blood pressure is 101/63, pulse is 110, sinus tachycardia, respirations are 20.  She is currently on __________. CARDIAC:  Regular rate and rhythm.  There is no audible murmur.  There are no carotid bruits. LUNGS:  Clear anteriorly. NEUROLOGIC:  She is alert and oriented x3. EXTREMITIES:  She has palpable peripheral pulses.  LABORATORY DATA:  Sodium 138, potassium 4.1, BUN 15, creatinine 0.9, glucose 145.  D-dimer 1.01, myoglobin 94, CK-MB 5.8, troponin 0.77. White count was 61.8, ANC was 4235.  IMPRESSION:  [name] is a 65 year old woman with multiple cardiac risk factors and history of coronary artery disease.  She presents with shortness of breath and back pain with a severe episode last night with elevated troponins.  Catheterization shows critical 3-vessel disease with totally occluded right coronary artery and subtotally occluded left anterior descending coronary artery and circumflex with significant left ventricular dysfunction.  Coronary artery bypass grafting is indicated for survival benefit as well as relief of symptoms.  Given the severity of her disease __________  really is no safe waiting interval and I have recommended that she proceed with surgery urgently.  I discussed in detail with the patient the indications, risks, benefits, and alternatives.  She understands the general nature of the procedure, need for general anesthesia, incision to be  used, expected hospital stay, and overall recovery.  She understands the risks include but not limited to death, stroke, possible need for transfusions, infections as well as other organ system dysfunction including respiratory, renal, or GI complications.  She understands and accepts risks of surgery and agrees to proceed.  The OR has been notified.  We will proceed as soon as possible.     Salvatore Decent Dorris Fetch, M.D.     SCH/MEDQ  D:  07/06/2010  T:  07/07/2010  Job:  [id number]  Electronically Signed by Charlett Lango M.D. on 07/18/2010 03:35:17 PM

## 2010-07-19 DIAGNOSIS — Z0279 Encounter for issue of other medical certificate: Secondary | ICD-10-CM

## 2010-07-24 ENCOUNTER — Encounter (HOSPITAL_COMMUNITY)
Admission: RE | Admit: 2010-07-24 | Discharge: 2010-07-24 | Disposition: A | Discharge status: Home / Self Care | Payer: BC Managed Care – PPO | Source: Ambulatory Visit | Attending: Thoracic Surgery (Cardiothoracic Vascular Surgery) | Admitting: Thoracic Surgery (Cardiothoracic Vascular Surgery)

## 2010-07-24 DIAGNOSIS — C3410 Malignant neoplasm of upper lobe, unspecified bronchus or lung: Secondary | ICD-10-CM | POA: Insufficient documentation

## 2010-07-24 DIAGNOSIS — R911 Solitary pulmonary nodule: Secondary | ICD-10-CM

## 2010-07-24 DIAGNOSIS — J90 Pleural effusion, not elsewhere classified: Secondary | ICD-10-CM | POA: Insufficient documentation

## 2010-07-24 DIAGNOSIS — Z951 Presence of aortocoronary bypass graft: Secondary | ICD-10-CM | POA: Insufficient documentation

## 2010-07-24 LAB — GLUCOSE, CAPILLARY: Glucose-Capillary: 110 mg/dL — ABNORMAL HIGH (ref 70–99)

## 2010-07-24 MED ORDER — IOHEXOL 300 MG/ML  SOLN
80.0000 mL | Freq: Once | INTRAMUSCULAR | Status: AC | PRN
  Administered 2010-07-24: 80 mL via INTRAVENOUS

## 2010-07-24 MED ORDER — FLUDEOXYGLUCOSE F - 18 (FDG) INJECTION
19.2000 | Freq: Once | INTRAVENOUS | Status: AC | PRN
  Administered 2010-07-24: 19.2 via INTRAVENOUS

## 2010-08-01 ENCOUNTER — Other Ambulatory Visit: Payer: Self-pay | Admitting: Thoracic Surgery (Cardiothoracic Vascular Surgery)

## 2010-08-01 DIAGNOSIS — I2510 Atherosclerotic heart disease of native coronary artery without angina pectoris: Secondary | ICD-10-CM

## 2010-08-02 ENCOUNTER — Encounter (INDEPENDENT_AMBULATORY_CARE_PROVIDER_SITE_OTHER): Payer: Self-pay | Admitting: Thoracic Surgery (Cardiothoracic Vascular Surgery)

## 2010-08-02 ENCOUNTER — Ambulatory Visit
Admission: RE | Admit: 2010-08-02 | Discharge: 2010-08-02 | Disposition: A | Discharge status: Home / Self Care | Payer: BC Managed Care – PPO | Source: Ambulatory Visit | Attending: Thoracic Surgery (Cardiothoracic Vascular Surgery) | Admitting: Thoracic Surgery (Cardiothoracic Vascular Surgery)

## 2010-08-02 DIAGNOSIS — I2510 Atherosclerotic heart disease of native coronary artery without angina pectoris: Secondary | ICD-10-CM

## 2010-08-03 NOTE — Assessment & Plan Note (Signed)
OFFICE VISIT  [name], [name] DOB:  April 26, 1945                                        August 02, 2010 CHART #:  [id number]  The patient is a 65 year old woman presented with unstable angina and congestive heart failure.  At catheterization, she had critical LAD and circumflex stenoses in the setting of occluded right and her EF was 35%. She went for emergent coronary artery bypass grafting that went well, but notably, during the procedure, a left upper lobe nodule was palpated, unfortunately this was not really accessible for wedge resection.  Postoperatively, she did well, was ultimately weaned off oxygen.  She was in congestive failure for quite some time, but was discharged home without any significant postoperative complications. She now returns for follow up.  She says she is doing well.  She has got some soreness, occasional pain, but it is usually short-lived.  She has not been taking any narcotics or not even really taking Tylenol for the discomfort.  She has not had any anginal-type pain or shortness of breath, which did not have any swelling in her legs.  Overall, she feels well.  She is anxious to go to the beach next week and see her mother after the week of August 22, 2010.  CURRENT MEDICATIONS: 1. Aspirin 325 mg daily. 2. Crestor 40 mg daily. 3. Kay Ciel 20 mEq daily. 4. Toprol-XL 25 mg daily. 5. Lisinopril 10 mg daily. 6. Lasix 40 mg daily. 7. Guaifenesin 600 mg b.i.d. 8. Combivent inhaler p.r.n.  She is no longer taking oxycodone.  She has no known drug allergies.  PHYSICAL EXAMINATION:  General:  The patient is a well-appearing 65 year old woman in no acute distress. Vital Signs:  Blood pressure 112/73, pulse 100, respirations 20, oxygen saturation 95% on room air.  Lungs: Clear with equal breath sounds bilaterally.  Cardiac:  Has regular rate and rhythm.  Normal S1 and S2.  No rubs, murmurs, or gallops.  Sternum is stable.  Sternal  incision is clean, dry, and intact.  Extremities: Leg incision is healing well.  She has no peripheral edema.  Her chest x-ray was done, but the Radiology system is down, I do not have access to the report or the films.  She did have a PET scan done on July 24, 2010, which showed increased metabolic uptake in the left upper lobe mass with an SUV of 5.6.  There was an 8-mm AP window node that had an SUV of 3.2, which was concerning for possible stage IIIA.  There was a low right jugular node which had an SUV of 2.5, but I suspect that is related to the intravenous access of the internal jugular during her coronary artery bypass grafting.  The patient is doing very well from a coronary artery bypass surgery. She is not to lift any objects greater than 10 pounds for another 2 weeks or 20 pounds for another 4 weeks.  She may begin driving on a limited basis.  Appropriate precautions were discussed.  She may travel as a passenger on a car, but should always get out and walk around every hour to hour and half for least 5-10 minutes.  We are planning to set her up to come back and see me in 4 weeks, we will do a plain chest x- ray at that time.  She  is not ready at this point in time to undergo a lung operation, but I will see her back after she gets back from [place] and we will talk then about setting up of surgery.  She would need a wedge resection and then possibly a segmentectomy or lobectomy and node dissection.  She and her husband both understand that I very strongly suspect that this is a lung cancer to the point where I really do not see that it would be anything else.  Salvatore Decent Dorris Fetch, M.D. Electronically Signed  SCH/MEDQ  D:  08/02/2010  T:  08/03/2010  Job:  [id number]  cc:   Nicki Guadalajara, M.D.

## 2010-08-19 DIAGNOSIS — C3490 Malignant neoplasm of unspecified part of unspecified bronchus or lung: Secondary | ICD-10-CM

## 2010-08-19 HISTORY — DX: Malignant neoplasm of unspecified part of unspecified bronchus or lung: C34.90

## 2010-08-28 ENCOUNTER — Other Ambulatory Visit: Payer: Self-pay | Admitting: Thoracic Surgery (Cardiothoracic Vascular Surgery)

## 2010-08-28 DIAGNOSIS — I2510 Atherosclerotic heart disease of native coronary artery without angina pectoris: Secondary | ICD-10-CM

## 2010-08-29 ENCOUNTER — Encounter (INDEPENDENT_AMBULATORY_CARE_PROVIDER_SITE_OTHER): Payer: Self-pay | Admitting: Thoracic Surgery (Cardiothoracic Vascular Surgery)

## 2010-08-29 ENCOUNTER — Ambulatory Visit
Admission: RE | Admit: 2010-08-29 | Discharge: 2010-08-29 | Disposition: A | Discharge status: Home / Self Care | Payer: BC Managed Care – PPO | Source: Ambulatory Visit | Attending: Thoracic Surgery (Cardiothoracic Vascular Surgery) | Admitting: Thoracic Surgery (Cardiothoracic Vascular Surgery)

## 2010-08-29 DIAGNOSIS — I2510 Atherosclerotic heart disease of native coronary artery without angina pectoris: Secondary | ICD-10-CM

## 2010-08-29 DIAGNOSIS — D491 Neoplasm of unspecified behavior of respiratory system: Secondary | ICD-10-CM

## 2010-08-30 NOTE — H&P (Signed)
HISTORY AND PHYSICAL EXAMINATION  August 29, 2010  Re:  [name], [name]       DOB:  02-Dec-1945  The patient is a 65 year old woman with a chief complaint of a left lung nodule.  The patient presented back in May 2012 with a non-Q-wave myocardial infarction and severe three-vessel coronary artery disease.  She had 99% proximal LAD and circumflex lesions and a totally occluded right coronary artery.  She was taken to the operating room emergently. During the takedown of the left mammary artery, she was noted to have a left upper lobe nodule about 2 cm in diameter.  She did well postoperatively.  She did have some congestive heart failure.  Baseline ejection fraction was about 35%, but was discharged without any significant postop complications.  She was seen back in the office on June 14 at which time she was doing well but was not ready to address the lung nodule at that time.  She now returns in followup.  She says she is feeling well.  She has not had any chest pain, shortness of breath.  She is not having any incisional pain.  She has been seen gradually improving exercise tolerance.  She recently was visiting her family in [place].  At her followup in June, she had a PET scan done which showed hypermetabolic left upper lobe nodule with an SUV of 5.6.  There was an AP window node that was 8 mm and an SUV of 3.2.  There also was a question of a node in the right jugular area, although I think this was at her previous Swan-Ganz insertion site.  She has small left pleural effusion consistent with a mammary artery takedown.  Her past medical history is significant for coronary artery disease status post coronary artery bypass grafting in May, previous MI in 1996, dyslipidemia, hypertension, gastroesophageal reflux, tobacco abuse, anemia, congestive heart failure, ischemic cardiomyopathy, left lung nodule.  CURRENT MEDICATIONS: 1. Aspirin 325 mg  daily. 2. Crestor 40 mg nightly. 3. Potassium chloride 20 mEq daily. 4. Toprol-XL 25 mg daily. 5. Lisinopril 10 mg daily. 6. Lasix 40 mg daily. 7. Guaifenesin 600 mg b.i.d. p.r.n. 8. Combivent inhaler p.r.n.Marland Kitchen  ALLERGIES:  She has no known drug allergies.  FAMILY HISTORY:  Significant for heart disease.  SOCIAL HISTORY:  She is married.  She was a pack a day smoker up until the time of her surgery.  REVIEW OF SYSTEMS:  See HPI.  All other systems are negative.  Overall she feels very well and is recovering very nicely from her surgery.  PHYSICAL EXAMINATION:  GENERAL:  The patient is a well-appearing 65 year old woman in no acute distress.  VITAL SIGNS:  Her blood pressure is 114/74, pulse 100, respirations are 18, her oxygen saturation is 95% on room air.  NEUROLOGIC:  She is alert and oriented x3 with no focal deficits.  HEENT:  Unremarkable.  NECK:  Supple without palpable adenopathy, thyromegaly or bruits.  CARDIAC:  Regular rate and rhythm. Normal S1 and S2.  No murmurs, rubs or gallops.  Sternal incision is well healed.  LUNGS:  Clear with equal breath sounds bilaterally.  There is no cervical, supraclavicular, axillary or epitrochlear adenopathy. EXTREMITIES:  There is no peripheral edema.  LABORATORY DATA:  Chest x-ray today shows a stable 1.9 cm left lung nodule suspicious for neoplasm.  IMPRESSION:  The patient is a 65 year old woman who underwent emergent coronary artery bypass grafting about 2 months ago and she was noted  to have a left upper lobe nodule at the time of her surgery but was not really accessible for biopsy in the setting of emergency bypass grafting.  I have recommended to her that we proceed with a left VATS, her possible thoracotomy for wedge resection for definitive diagnosis and if it was positive for cancer, we would proceed with lobectomy and lymph node dissection.  I discussed in detail with her and her husband the nature of the disease, the  probability that this represents a primary lung carcinoma, the operation, expected hospital stay and overall recovery.  I do think she is recovered sufficiently from her coronary artery bypass grafting, at this point to go ahead with the surgery and I would be uncomfortable with further significant delay in doing so.  She has a vacation planned for the week of July 21 and wishes to wait until July 30 to proceed with the surgery.  I did discuss with them the fact that this is a major operation with significant risks including but not limited to death, stroke, MI, possible injury to the mammary artery, bleeding, possible need for transfusions, infections, deep venous thrombosis or pulmonary embolus and prolonged air leakage as well as other unpredictable or unforeseeable complications.  She understands these risk and agrees to proceed.  She does need formal pulmonary function testing and we will go ahead and get that done as soon as possible and then plan for the surgery on July 30 per her wishes.  Salvatore Decent Dorris Fetch, M.D. Electronically Signed  SCH/MEDQ  D:  08/29/2010  T:  08/30/2010  Job:  [id number]  cc:   Nicki Guadalajara, M.D.

## 2010-09-11 HISTORY — PX: CARDIOVASCULAR STRESS TEST: SHX262

## 2010-09-13 ENCOUNTER — Encounter (HOSPITAL_COMMUNITY)
Admission: RE | Admit: 2010-09-13 | Discharge: 2010-09-13 | Disposition: A | Discharge status: Home / Self Care | Payer: BC Managed Care – PPO | Source: Ambulatory Visit | Attending: Thoracic Surgery (Cardiothoracic Vascular Surgery) | Admitting: Thoracic Surgery (Cardiothoracic Vascular Surgery)

## 2010-09-13 ENCOUNTER — Ambulatory Visit (HOSPITAL_COMMUNITY)
Admission: RE | Admit: 2010-09-13 | Discharge: 2010-09-13 | Disposition: A | Discharge status: Home / Self Care | Payer: BC Managed Care – PPO | Source: Ambulatory Visit | Attending: Thoracic Surgery (Cardiothoracic Vascular Surgery) | Admitting: Thoracic Surgery (Cardiothoracic Vascular Surgery)

## 2010-09-13 DIAGNOSIS — J984 Other disorders of lung: Secondary | ICD-10-CM | POA: Insufficient documentation

## 2010-09-13 LAB — COMPREHENSIVE METABOLIC PANEL
ALT: 22 U/L (ref 0–35)
AST: 19 U/L (ref 0–37)
CO2: 25 mEq/L (ref 19–32)
Chloride: 103 mEq/L (ref 96–112)
Creatinine, Ser: 0.84 mg/dL (ref 0.50–1.10)
GFR calc non Af Amer: >60 mL/min (ref >60)
Sodium: 139 mEq/L (ref 135–145)
Total Bilirubin: 0.3 mg/dL (ref 0.3–1.2)

## 2010-09-13 LAB — URINALYSIS, ROUTINE W REFLEX MICROSCOPIC
Hgb urine dipstick: NEGATIVE
Nitrite: NEGATIVE
Protein, ur: NEGATIVE mg/dL
Urobilinogen, UA: 0.2 mg/dL (ref 0.0–1.0)

## 2010-09-13 LAB — BLOOD GAS, ARTERIAL
Acid-Base Excess: 0.5 mmol/L (ref 0.0–2.0)
O2 Saturation: 95.4 %
Patient temperature: 98.6
TCO2: 24.9 mmol/L (ref 0–100)

## 2010-09-13 LAB — APTT: aPTT: 28 seconds (ref 24–37)

## 2010-09-13 LAB — PROTIME-INR
INR: 1.01 (ref 0.00–1.49)
Prothrombin Time: 13.5 seconds (ref 11.6–15.2)

## 2010-09-13 LAB — SURGICAL PCR SCREEN
MRSA, PCR: NEGATIVE
Staphylococcus aureus: NEGATIVE

## 2010-09-13 LAB — CBC
Hemoglobin: 12.8 g/dL (ref 12.0–15.0)
MCHC: 33.1 g/dL (ref 30.0–36.0)
RBC: 4.43 MIL/uL (ref 3.87–5.11)
WBC: 9 10*3/uL (ref 4.0–10.5)

## 2010-09-13 LAB — URINE MICROSCOPIC-ADD ON

## 2010-09-13 NOTE — H&P (Signed)
  [name], [name]             ACCOUNT NO.:  [id number]  MEDICAL RECORD NO.:  [id number]           PATIENT TYPE:  I  LOCATION:  2307                         FACILITY:  [facility]  PHYSICIAN:  Judie Petit, M.D. DATE OF BIRTH:  [birth year]  DATE OF ADMISSION:  07/06/2010 DATE OF DISCHARGE:                             HISTORY & PHYSICAL   TRANSESOPHAGEAL ECHOCARDIOGRAM REPORT  Patient of Dr. Dorris Fetch.  [name] was a 65 year old female who presented for emergency coronary artery bypass grafting by Dr. Charlett Lango.  The patient was brought over from the cath lab and taken directly to the operating room where pulmonary artery and pulmonary artery lines were placed without difficulty prior to induction.  After general anesthesia was performed, transesophageal echocardiogram probe was placed.  After the TEE probe was lubricated, the probe was placed without difficulty.  Left ventricle, there was hypokinesis globally.  There was moderate reduction in ejection fraction.  The ventricle was moderately dilated.  Aortic valve and trileaflet appearing in nature.  There do not appear to be any evidence of significant regurgitation.  Mitral valve, the leaflet appear to coapt well, however, there was 2-3+ mitral regurgitant flow noted. Atrial septum, trivial  patent foramen ovale noted.  Tricuspid valve, no evidence of significant regurgitation.  Left atrium, no evidence of thrombus noted in the appendage.  Post bypass, left ventricle continued with global hypokinesis, however, with some improvement with pressors. The patient came off bypass with milrinone and dopamine.  Initially, the ventricle was noted to be with decreased volume, however, with some volume resuscitations a contractility did improve somewhat.  It did not appear to be any other significant changes from preoperative findings. The TEE probe was subsequently removed without difficulty.  The patient was  subsequently taken to the SICU in stable condition.     Judie Petit, M.D.     CE/MEDQ  D:  07/09/2010  T:  07/10/2010  Job:  [id number]  Electronically Signed by Judie Petit M.D. on 09/13/2010 03:50:33 PM

## 2010-09-17 ENCOUNTER — Other Ambulatory Visit: Payer: Self-pay | Admitting: Thoracic Surgery (Cardiothoracic Vascular Surgery)

## 2010-09-17 ENCOUNTER — Ambulatory Visit (HOSPITAL_COMMUNITY)
Admission: RE | Admit: 2010-09-17 | Discharge: 2010-09-17 | Disposition: A | Discharge status: Home / Self Care | Payer: BC Managed Care – PPO | Source: Ambulatory Visit | Attending: Thoracic Surgery (Cardiothoracic Vascular Surgery) | Admitting: Thoracic Surgery (Cardiothoracic Vascular Surgery)

## 2010-09-17 ENCOUNTER — Inpatient Hospital Stay (HOSPITAL_COMMUNITY): Payer: BC Managed Care – PPO

## 2010-09-17 ENCOUNTER — Inpatient Hospital Stay (HOSPITAL_COMMUNITY)
Admission: RE | Admit: 2010-09-17 | Discharge: 2010-09-22 | DRG: 075 | Disposition: A | Discharge status: Home / Self Care | Payer: BC Managed Care – PPO | Source: Ambulatory Visit | Attending: Thoracic Surgery (Cardiothoracic Vascular Surgery) | Admitting: Thoracic Surgery (Cardiothoracic Vascular Surgery)

## 2010-09-17 DIAGNOSIS — C3410 Malignant neoplasm of upper lobe, unspecified bronchus or lung: Principal | ICD-10-CM | POA: Diagnosis present

## 2010-09-17 DIAGNOSIS — R918 Other nonspecific abnormal finding of lung field: Secondary | ICD-10-CM

## 2010-09-17 DIAGNOSIS — F17200 Nicotine dependence, unspecified, uncomplicated: Secondary | ICD-10-CM | POA: Diagnosis present

## 2010-09-17 DIAGNOSIS — I2589 Other forms of chronic ischemic heart disease: Secondary | ICD-10-CM | POA: Diagnosis present

## 2010-09-17 DIAGNOSIS — I10 Essential (primary) hypertension: Secondary | ICD-10-CM | POA: Diagnosis present

## 2010-09-17 DIAGNOSIS — I252 Old myocardial infarction: Secondary | ICD-10-CM

## 2010-09-17 DIAGNOSIS — Z7982 Long term (current) use of aspirin: Secondary | ICD-10-CM

## 2010-09-17 DIAGNOSIS — K219 Gastro-esophageal reflux disease without esophagitis: Secondary | ICD-10-CM | POA: Diagnosis present

## 2010-09-17 DIAGNOSIS — Z951 Presence of aortocoronary bypass graft: Secondary | ICD-10-CM

## 2010-09-17 DIAGNOSIS — D381 Neoplasm of uncertain behavior of trachea, bronchus and lung: Secondary | ICD-10-CM

## 2010-09-17 DIAGNOSIS — I2510 Atherosclerotic heart disease of native coronary artery without angina pectoris: Secondary | ICD-10-CM | POA: Diagnosis present

## 2010-09-17 DIAGNOSIS — I509 Heart failure, unspecified: Secondary | ICD-10-CM | POA: Diagnosis present

## 2010-09-17 HISTORY — PX: OTHER SURGICAL HISTORY: SHX169

## 2010-09-17 LAB — TYPE AND SCREEN: Antibody Screen: NEGATIVE

## 2010-09-18 ENCOUNTER — Inpatient Hospital Stay (HOSPITAL_COMMUNITY): Payer: BC Managed Care – PPO

## 2010-09-18 LAB — BASIC METABOLIC PANEL
BUN: 11 mg/dL (ref 6–23)
GFR calc Af Amer: >60 mL/min (ref >60)
GFR calc non Af Amer: >60 mL/min (ref >60)
Potassium: 4.1 mEq/L (ref 3.5–5.1)
Sodium: 138 mEq/L (ref 135–145)

## 2010-09-18 LAB — BLOOD GAS, ARTERIAL
O2 Saturation: 94.2 %
Patient temperature: 98.6
TCO2: 25.5 mmol/L (ref 0–100)
pH, Arterial: 7.401 — ABNORMAL HIGH (ref 7.350–7.400)

## 2010-09-18 LAB — CBC
HCT: 34.7 % — ABNORMAL LOW (ref 36.0–46.0)
MCHC: 32.6 g/dL (ref 30.0–36.0)
RDW: 15 % (ref 11.5–15.5)

## 2010-09-18 NOTE — Op Note (Signed)
NAMEJERRIYAH, [name]             ACCOUNT NO.:  [id number]  MEDICAL RECORD NO.:  [id number]  LOCATION:  2308                         FACILITY:  [facility]  PHYSICIAN:  Salvatore Decent. Dorris Fetch, M.D.DATE OF BIRTH:  May 29, 1945  DATE OF PROCEDURE:  09/17/2010 DATE OF DISCHARGE:                              OPERATIVE REPORT   PREOPERATIVE DIAGNOSIS:  Left upper lobe nodule.  POSTOPERATIVE DIAGNOSIS:  Adenocarcinoma, likely stage IA left upper lobe.  PROCEDURE:  Left video-assisted thoracotomy, left upper lobectomy, mediastinal lymph node dissection.  SURGEON:  Salvatore Decent. Dorris Fetch, MD  FIRST ASSISTANT:  Doree Fudge, PA  SECOND ASSISTANT:  Rowe Clack, PA-C  ANESTHESIA:  General.  FINDINGS:  A 2-cm mass left upper lobe.  Frozen section, adenocarcinoma, bronchial margins negative, enlarged level 10 node, negative by frozen section, severe adhesions to the anterior mediastinum, mammary artery preserved.  CLINICAL NOTE:  [name] is a 65 year old woman who presented in May with a non-Q-wave myocardial infarction.  She had severe 3-vessel coronary artery disease including totally occluded right coronary artery and 99% proximal LAD and circumflex lesions.  She was taken to the operating room emergently.  During that procedure, she was noted to have left upper lobe nodule.  This was not accessible to biopsy at the time for emergency coronary bypass grafting.  She subsequently had a PET scan which showed hypermetabolic activity in the nodule as well as a questionable aortopulmonary window node.  There also was metabolic activity noted were Theone Murdoch catheter had been previously placed in her right internal jugular vein.  She was advised to undergo surgical resection.  Initial plan was for wedge resection followed by lobectomy if the nodule was cancerous along with a lymph node dissection of lobectomy was necessary.  The indications, risks, benefits, and alternatives were  discussed in detail with the patient.  She understood and accepted the risks and agreed to proceed.  OPERATIVE NOTE:  [name] was brought to the preoperative holding area on September 17, 2010.  There, the Anesthesia Service established central venous access and arterial blood pressure monitoring line.  PAS hose were placed for DVT prophylaxis.  The patient was taken to the operating room, anesthetized and intubated with a double-lumen endotracheal tube.  There was difficult to position tube, but ultimately the tube was placed satisfactorily.  Foley catheter was placed. Intravenous antibiotics were administered.  The patient was placed in a right lateral decubitus position and the chest on the left side was prepped and draped in usual sterile fashion.  Single lung ventilation of the right lung was carried out was tolerated well throughout the procedure.  An incision made in the seventh intercostal space in the mid axillary line, was carried through the skin and subcutaneous tissue. The chest was entered bluntly using hemostat.  A port was placed through the incision and thoracoscope was placed in the chest.  There were essentially no adhesions involving the lower lobe or the fissure; however, there were adhesions noted anteriorly and medially and superiorly of the left upper lobe.  A small utility thoracotomy was made in approximately the fifth intercostal space in the anterior axillary line.  This was carried through the skin and  subcutaneous tissue.  The serratus was spared.  The latissimus was divided.  Intercostal muscles were divided.  Secondary incision was made posterior to the tip of the scapula for retraction purposes.  A great deal of time then was spent trying to dissect the upper lobe off the mediastinum.  Great care was taken due to the mammary artery graft being in the vicinity and this was ultimately impossible to do with a video thoracoscopic approach. Therefore, the  incision was lengthened and a retractor was placed.  The dissection continued to be a very difficult.  The mammary artery could be visualized entering the pericardium.  In this area, the adhesions were not particularly severe, but more medially to this, the adhesions were dense and visualization was very limited.  The nodule was easily palpable in the lung; however, there was no clear way of getting an acceptable wedge resection of the nodule and decision was made to proceed with a lobectomy allowing the safer approach to taking down the final adhesions.  The inferior ligament was mobilized.  The pleura was incised anteriorly and posteriorly.  The fissure was relatively complete.  There was some edema.  The fissure was dissected to expose the pulmonary artery.  The lingular branches were noted and were taken separately.  These were doubly tied and clipped and divided.  The small portion of the fissure anteromedially was incomplete, was completed with a single firing of the Endo-GIA stapler.  There was a large level 11 lymph node.  This was taken and sent as a separate specimen for frozen section which subsequently returned benign.  Additional level 10 and 11 nodes were taken as separate specimens during the dissection of the bronchus and the pulmonary artery.  A large left upper lobe branch was ligated and divided, and subsequently oversewn with a 4-0 Prolene suture.  The apical branches were not taken at this point.  The pulmonary vein was dissected out, encircled, and divided with an Endo- GIA stapler.  Additional lymph nodes were taken.  The bronchus was now well exposed.  An Endo-GIA stapler was placed across the bronchus.  A test inflation of the lower lobe showed good inflation with no cross ventilation to the upper lobe.  The left lower lobe was deflated.  The Endo-GIA stapler was fired.  The remaining upper lobe arterial branches now were well exposed, dissected free, and divided  with an Endo-GIA stapler.  The hilum was now completely divided; however, there were still adhesions along the chest wall which were now taken down.  In several places, the dissection was subpleural, but this was well way from the tumor.  The specimen was removed and was sent for frozen section of the mass which returned adenocarcinoma.  The bronchial margins were negative.  The aortopulmonary window level 5 and 6 nodes were dissected out as were level 5 lymph nodes.  These were all sent as separate specimens for permanent section.  Hemostasis was achieved.  A test inflation to 30 cm of water revealed no leakage from the bronchial stump.  A 28-French chest tube was placed and directed anteriorly, a 36- French right-angle tube was directed posteriorly through the original port incision.  These were secured with #1 silk sutures, #2 Vicryl pericostal sutures were placed.  Prior to tying these, the lower lobe was inflated.  There was good aeration of the lower lobe.  The pericostal sutures were tied.  The serratus was reattached laterally with the running 0 Vicryl suture.  Latissimus was  closed with running 0 Vicryl suture.  Subcutaneous tissue and skin were closed in standard fashion.  All sponge, needle, and sponge counts were correct at the end of procedure.  The patient was extubated in the operating room and taken to the recovery room in good condition with the chest tubes to suction.  All sponge, needle, and sponge counts were correct at the end of the procedure.     Salvatore Decent Dorris Fetch, M.D.     SCH/MEDQ  D:  09/17/2010  T:  09/18/2010  Job:  [id number]  cc:   Nicki Guadalajara, M.D. [facility]  Electronically Signed by Charlett Lango M.D. on 09/18/2010 01:40:13 PM

## 2010-09-19 ENCOUNTER — Inpatient Hospital Stay (HOSPITAL_COMMUNITY): Payer: BC Managed Care – PPO

## 2010-09-19 LAB — CBC
HCT: 33.1 % — ABNORMAL LOW (ref 36.0–46.0)
MCH: 28.3 pg (ref 26.0–34.0)
MCHC: 32 g/dL (ref 30.0–36.0)
MCV: 88.5 fL (ref 78.0–100.0)
RDW: 15.3 % (ref 11.5–15.5)

## 2010-09-19 LAB — COMPREHENSIVE METABOLIC PANEL
Albumin: 2.7 g/dL — ABNORMAL LOW (ref 3.5–5.2)
BUN: 10 mg/dL (ref 6–23)
Calcium: 9.3 mg/dL (ref 8.4–10.5)
Creatinine, Ser: 0.66 mg/dL (ref 0.50–1.10)
GFR calc Af Amer: >60 mL/min (ref >60)
Potassium: 4 mEq/L (ref 3.5–5.1)
Total Protein: 5.7 g/dL — ABNORMAL LOW (ref 6.0–8.3)

## 2010-09-20 ENCOUNTER — Inpatient Hospital Stay (HOSPITAL_COMMUNITY): Payer: BC Managed Care – PPO

## 2010-09-21 ENCOUNTER — Inpatient Hospital Stay (HOSPITAL_COMMUNITY): Payer: BC Managed Care – PPO

## 2010-09-21 LAB — BASIC METABOLIC PANEL
Chloride: 103 mEq/L (ref 96–112)
GFR calc Af Amer: >60 mL/min (ref >60)
GFR calc non Af Amer: >60 mL/min (ref >60)
Potassium: 3.1 mEq/L — ABNORMAL LOW (ref 3.5–5.1)
Sodium: 140 mEq/L (ref 135–145)

## 2010-09-21 LAB — CBC
Hemoglobin: 10.5 g/dL — ABNORMAL LOW (ref 12.0–15.0)
MCHC: 32.8 g/dL (ref 30.0–36.0)
RDW: 14.7 % (ref 11.5–15.5)
WBC: 11.2 10*3/uL — ABNORMAL HIGH (ref 4.0–10.5)

## 2010-09-22 ENCOUNTER — Inpatient Hospital Stay (HOSPITAL_COMMUNITY): Payer: BC Managed Care – PPO

## 2010-09-22 LAB — BASIC METABOLIC PANEL
CO2: 28 mEq/L (ref 19–32)
Chloride: 101 mEq/L (ref 96–112)
GFR calc Af Amer: >60 mL/min (ref >60)
Potassium: 3.5 mEq/L (ref 3.5–5.1)

## 2010-09-28 ENCOUNTER — Other Ambulatory Visit: Payer: Self-pay | Admitting: Thoracic Surgery (Cardiothoracic Vascular Surgery)

## 2010-09-28 DIAGNOSIS — C3410 Malignant neoplasm of upper lobe, unspecified bronchus or lung: Secondary | ICD-10-CM

## 2010-10-01 ENCOUNTER — Ambulatory Visit
Admission: RE | Admit: 2010-10-01 | Discharge: 2010-10-01 | Disposition: A | Discharge status: Home / Self Care | Payer: BC Managed Care – PPO | Source: Ambulatory Visit | Attending: Thoracic Surgery (Cardiothoracic Vascular Surgery) | Admitting: Thoracic Surgery (Cardiothoracic Vascular Surgery)

## 2010-10-01 ENCOUNTER — Other Ambulatory Visit: Payer: Self-pay | Admitting: Thoracic Surgery (Cardiothoracic Vascular Surgery)

## 2010-10-01 ENCOUNTER — Ambulatory Visit (INDEPENDENT_AMBULATORY_CARE_PROVIDER_SITE_OTHER): Payer: Self-pay | Admitting: Thoracic Surgery (Cardiothoracic Vascular Surgery)

## 2010-10-01 DIAGNOSIS — C3490 Malignant neoplasm of unspecified part of unspecified bronchus or lung: Secondary | ICD-10-CM

## 2010-10-01 DIAGNOSIS — C3410 Malignant neoplasm of upper lobe, unspecified bronchus or lung: Secondary | ICD-10-CM

## 2010-10-01 DIAGNOSIS — I2510 Atherosclerotic heart disease of native coronary artery without angina pectoris: Secondary | ICD-10-CM

## 2010-10-02 NOTE — Assessment & Plan Note (Signed)
OFFICE VISIT  CAMISHA, SREY DOB:  24-Sep-1945                                        October 01, 2010 CHART #:  [id number]  The patient is a 65 year old woman who had presented with a non-Q-wave MI back in May 2012, had severe three-vessel disease and underwent emergent coronary artery bypass grafting.  During that procedure, she was found to have a left upper lobe nodule.  She recovered from her bypass surgery and then we went back and did a left upper lobectomy for what turned out to be a stage IB adenocarcinoma.  The pathologist felt that this could be an enteric variant of lung cancer, but also raised the possibility that this could be metastatic colon cancer even though nothing showed up in the gastrointestinal tract on PET scan.  The patient returns today for postoperative followup visit.  She says she is doing well.  She is not taking any pain medication which is remarkable.  Her exercise tolerance is good.  She is anxious to increase her activities and get back to work.  She is also anxious to travel back to [place] again.  CURRENT MEDICATIONS: 1. Aspirin 325 mg daily. 2. Crestor 40 mg daily. 3. Toprol-XL 50 mg daily. 4. Lasix 20 mg daily.  She has no known drug allergies.  PHYSICAL EXAMINATION:  The patient is a 65 year old woman, in no acute distress.  Her blood pressure is 115/66, pulse 92, respirations are 18, her oxygen saturation is 95% on room air.  Lungs are clear, slightly diminished breath sounds at the left base.  Incisions are well healed. Chest tube sutures were removed.  Cardiac exam has regular rate and rhythm.  Normal S1 and S2.  Chest x-ray shows postoperative changes.  IMPRESSION:  The patient is a 65 year old woman.  She is now status post coronary artery bypass grafting as well as left upper lobectomy for cancer within the past 3 months or so.  She is doing extremely well at this point in time, much better than  I would have expected quite honestly. She is having no pain and is not having to take narcotics. She is anxious to increase her activities, I told her it is okay for her to start driving on a limited basis, still not to lift anything that weighs greater than 10 pounds until after Labor Day and I do not want her to return to work until after Labor Day.  I will plan to see her back in about 3 months.  We will do a chest x-ray at that time.  Then, we will plan to follow her with alternating chest x-rays and CTs.  The only unresolved issue is this question of whether this could be metastatic colon cancer.  Again, this looked further, much more consistent with primary lung cancer, but I think since the question has been raised, she definitely should have a colonoscopy to rule out a malignancy in that location.  She does not have a steady primary care practitioner, so I will go ahead and refer her to a gastroenterologist for evaluation.  Salvatore Decent Dorris Fetch, M.D. Electronically Signed  SCH/MEDQ  D:  10/01/2010  T:  10/02/2010  Job:  [id number]

## 2010-10-08 NOTE — Discharge Summary (Signed)
[name], [name]             ACCOUNT NO.:  [id number]  MEDICAL RECORD NO.:  [id number]  LOCATION:                                 FACILITY:  PHYSICIAN:  Salvatore Decent. Dorris Fetch, M.D.DATE OF BIRTH:  May 22, 1945  DATE OF ADMISSION:  09/17/2010 DATE OF DISCHARGE:  09/22/2010                              DISCHARGE SUMMARY   PRIMARY ADMITTING DIAGNOSIS:  Left upper lobe lung nodule.  ADDITIONAL/DISCHARGE DIAGNOSES: 1. Adenocarcinoma of the left upper lobe. 2. History of coronary artery disease status post emergency coronary     artery bypass grafting in May of 2012. 3. Status post non-Q-wave myocardial infarction. 4. History of congestive heart failure. 5. Hypertension. 6. Dyslipidemia. 7. Gastroesophageal reflux disease. 8. Ischemic cardiomyopathy. 9. Tobacco abuse. 10.Previous history of tobacco abuse.  PROCEDURES PERFORMED:  Left VATS, left upper lobectomy with mediastinal lymph node dissection.  HISTORY:  The patient is a 65 year old female who is known to [facility].  She originally presented in May of 2012, with a non-Q-wave myocardial infarction.  She was found on catheterization to have severe three- vessel coronary artery disease including a totally occluded right coronary artery and a 99% proximal LAD and circumflex lesions.  She underwent emergency coronary artery bypass grafting.  During that procedure, she was noted to have a left upper lobe lung nodule which was not accessible to biopsy at that time.  She was seen back recently in followup in our office and had a PET scan performed which showed hypermetabolic activity in the left upper lobe nodule with an SUV of 5.6.  There was also an AP window node that was 8 mm with an SUV of 3.2. Dr. Dorris Fetch, reviewed her films and felt that she would benefit from left upper lobe wedge resection or lobectomy at this time for diagnosis. All risks, benefits, and alternatives of surgery were explained to the patient, she  agreed to proceed.  HOSPITAL COURSE:  The patient was admitted to [facility] on September 17, 2010, and was taken to the operating room where she underwent left upper lobectomy as described above.  Please see previously dictated operative report for complete details of surgery.  Intraoperative frozen section revealed adenocarcinoma and all bronchial margins were negative. The final pathology revealed adenocarcinoma stage T1b, N0, and her husband discussed with the patient by Dr. Dorris Fetch.  Her postoperative course has been uneventful.  Her chest tubes have been removed in the standard fashion.  She is tolerating a regular diet and is having normal bowel and bladder function.  She has remained afebrile and her vital signs have been stable.  She is maintaining sats of greater than 90% on room air.  Her incisions are all healing well.  Her most recent labs on postop day #4, show sodium of 140, potassium of 3.1, which is currently being repleted, BUN 6, creatinine 0.67, white count 11.2, hemoglobin 10.5, hematocrit 32, platelets 229.  Post chest tube removal x-ray shows small left pneumothorax.  She will undergo PA and lateral chest x-ray on the morning of September 22, 2010.  This has remained stable and the patient is otherwise doing well, we anticipate discharge home.  DISCHARGE MEDICATIONS: 1. Percocet 5/325 one  to two q.4 hours p.r.n. pain. 2. Aspirin 325 mg daily. 3. Lasix 20 mg daily. 4. Lisinopril 10 mg daily. 5. Toprol-XL 50 mg daily. 6. Crestor 40 mg daily.  DISCHARGE INSTRUCTIONS:  She is asked to refrain from driving, heavy lifting, or strenuous activity.  She may continue ambulating daily and using her incentive spirometer.  She may shower daily and clean her incisions with soap and water.  She will continue her same preoperative diet.  DISCHARGE FOLLOWUP:  She will see Dr. Dorris Fetch back in the office on October 01, 2010, with a chest x-ray.  In the interim, if she  experiences any problems or has questions, she is asked to contact our office immediately.     Coral Ceo, P.A.   ______________________________ Salvatore Decent Dorris Fetch, M.D.    GC/MEDQ  D:  09/21/2010  T:  09/21/2010  Job:  [id number]  cc:   Nicki Guadalajara, M.D.  Electronically Signed by Weldon Inches. on 10/04/2010 11:11:59 AM Electronically Signed by Charlett Lango M.D. on 10/08/2010 01:38:03 PM

## 2010-12-31 ENCOUNTER — Encounter: Payer: Self-pay | Admitting: Thoracic Surgery (Cardiothoracic Vascular Surgery)

## 2010-12-31 DIAGNOSIS — I2510 Atherosclerotic heart disease of native coronary artery without angina pectoris: Secondary | ICD-10-CM | POA: Insufficient documentation

## 2010-12-31 DIAGNOSIS — E785 Hyperlipidemia, unspecified: Secondary | ICD-10-CM | POA: Insufficient documentation

## 2010-12-31 DIAGNOSIS — Z720 Tobacco use: Secondary | ICD-10-CM | POA: Insufficient documentation

## 2010-12-31 DIAGNOSIS — I10 Essential (primary) hypertension: Secondary | ICD-10-CM | POA: Insufficient documentation

## 2010-12-31 DIAGNOSIS — K219 Gastro-esophageal reflux disease without esophagitis: Secondary | ICD-10-CM | POA: Insufficient documentation

## 2011-01-01 ENCOUNTER — Other Ambulatory Visit: Payer: Self-pay | Admitting: Thoracic Surgery (Cardiothoracic Vascular Surgery)

## 2011-01-01 DIAGNOSIS — D381 Neoplasm of uncertain behavior of trachea, bronchus and lung: Secondary | ICD-10-CM

## 2011-01-03 ENCOUNTER — Ambulatory Visit (INDEPENDENT_AMBULATORY_CARE_PROVIDER_SITE_OTHER): Payer: BC Managed Care – PPO | Admitting: Thoracic Surgery (Cardiothoracic Vascular Surgery)

## 2011-01-03 ENCOUNTER — Ambulatory Visit
Admission: RE | Admit: 2011-01-03 | Discharge: 2011-01-03 | Disposition: A | Discharge status: Home / Self Care | Payer: BC Managed Care – PPO | Source: Ambulatory Visit | Attending: Thoracic Surgery (Cardiothoracic Vascular Surgery) | Admitting: Thoracic Surgery (Cardiothoracic Vascular Surgery)

## 2011-01-03 ENCOUNTER — Encounter: Payer: Self-pay | Admitting: Thoracic Surgery (Cardiothoracic Vascular Surgery)

## 2011-01-03 ENCOUNTER — Encounter: Payer: BC Managed Care – PPO | Admitting: Thoracic Surgery (Cardiothoracic Vascular Surgery)

## 2011-01-03 VITALS — BP 146/79 | HR 80 | Resp 20 | Ht 64.0 in | Wt 183.0 lb

## 2011-01-03 DIAGNOSIS — Z902 Acquired absence of lung [part of]: Secondary | ICD-10-CM

## 2011-01-03 DIAGNOSIS — Z951 Presence of aortocoronary bypass graft: Secondary | ICD-10-CM

## 2011-01-03 DIAGNOSIS — Z85118 Personal history of other malignant neoplasm of bronchus and lung: Secondary | ICD-10-CM

## 2011-01-03 DIAGNOSIS — I2510 Atherosclerotic heart disease of native coronary artery without angina pectoris: Secondary | ICD-10-CM

## 2011-01-03 DIAGNOSIS — D381 Neoplasm of uncertain behavior of trachea, bronchus and lung: Secondary | ICD-10-CM

## 2011-01-03 DIAGNOSIS — Z9889 Other specified postprocedural states: Secondary | ICD-10-CM

## 2011-01-03 NOTE — Progress Notes (Signed)
PCP is No primary provider on file. Referring Provider is Lennette Bihari, MD  Chief Complaint  Patient presents with  . Follow-up    3 month f/u with CXR, s/p CABG and Left Upper Lobectomy for cancer    HPI: Mrs. Riding returns for a 3 month followup after her left upper lobectomy for a stage IB non-small cell carcinoma. She states that she's been doing well. She has occasional pains in the left side of her chest related to the thoracotomy incisions, but it is typically mild and transient. She's not had any anginal pain, shortness of breath, or wheezing. Weight is been stable. Overall she feels great.  Past Medical History  Diagnosis Date  . CAD (coronary artery disease)   . HTN (hypertension)   . Dyslipidemia   . Tobacco abuse   . GERD (gastroesophageal reflux disease)     Past Surgical History  Procedure Date  . Left vats 09/17/2010  . Coronary artery bypass graft 07/11/2010    No family history on file.  Social History History  Substance Use Topics  . Smoking status: Former Smoker -- 1.0 packs/day    Types: Cigarettes  . Smokeless tobacco: Not on file  . Alcohol Use: No    Current Outpatient Prescriptions  Medication Sig Dispense Refill  . albuterol-ipratropium (COMBIVENT) 18-103 MCG/ACT inhaler Inhale 2 puffs into the lungs every 6 (six) hours as needed.        Marland Kitchen aspirin 325 MG tablet Take 325 mg by mouth daily.        . metoprolol succinate (TOPROL-XL) 25 MG 24 hr tablet Take 25 mg by mouth daily. 75 mg po every day      . rosuvastatin (CRESTOR) 40 MG tablet Take 40 mg by mouth daily.          No Known Allergies  Review of Systems: See history of present illness, all other systems are negative  BP 146/79  Pulse 80  Resp 20  Ht 5\' 4"  (1.626 m)  Wt 183 lb (83.008 kg)  BMI 31.41 kg/m2  SpO2 97% Physical Exam: Gen. well-developed well-nourished, NAD No cervical, supraclavicular, axillary adenopathy  lungs clear, no wheezing, diminished breath sounds  left base Cardiac regular rate and rhythm, no murmur  Diagnostic Tests: Chest x-ray shows postoperative changes, no evidence of acute disease  Impression: Ms. Everman is now about 4 months out from a left upper lobectomy for a stage IB non-small cell carcinoma. She did not require adjuvant therapy. She is doing extremely well from a cardiac and pulmonary standpoint.  Plan: She will return in 3 months we'll do a CT scan of the chest at that time

## 2011-02-26 ENCOUNTER — Other Ambulatory Visit: Payer: Self-pay | Admitting: Thoracic Surgery (Cardiothoracic Vascular Surgery)

## 2011-02-26 DIAGNOSIS — C3490 Malignant neoplasm of unspecified part of unspecified bronchus or lung: Secondary | ICD-10-CM

## 2011-04-09 ENCOUNTER — Encounter: Payer: Self-pay | Admitting: Thoracic Surgery (Cardiothoracic Vascular Surgery)

## 2011-04-09 ENCOUNTER — Ambulatory Visit (INDEPENDENT_AMBULATORY_CARE_PROVIDER_SITE_OTHER): Payer: 59 | Admitting: Thoracic Surgery (Cardiothoracic Vascular Surgery)

## 2011-04-09 ENCOUNTER — Ambulatory Visit
Admission: RE | Admit: 2011-04-09 | Discharge: 2011-04-09 | Disposition: A | Discharge status: Home / Self Care | Payer: 59 | Source: Ambulatory Visit | Attending: Thoracic Surgery (Cardiothoracic Vascular Surgery) | Admitting: Thoracic Surgery (Cardiothoracic Vascular Surgery)

## 2011-04-09 VITALS — BP 124/76 | HR 80 | Resp 18 | Ht 64.0 in | Wt 183.0 lb

## 2011-04-09 DIAGNOSIS — Z902 Acquired absence of lung [part of]: Secondary | ICD-10-CM

## 2011-04-09 DIAGNOSIS — C3490 Malignant neoplasm of unspecified part of unspecified bronchus or lung: Secondary | ICD-10-CM

## 2011-04-09 DIAGNOSIS — Z85118 Personal history of other malignant neoplasm of bronchus and lung: Secondary | ICD-10-CM

## 2011-04-09 DIAGNOSIS — Z9889 Other specified postprocedural states: Secondary | ICD-10-CM

## 2011-04-09 NOTE — Progress Notes (Signed)
  HPI: [name] is a 66 year old woman who had coronary bypass grafting done in May of last year. At the time of surgery she was noted to have a left upper lobe mass. She successfully underwent a left upper lobectomy for a stage IB non-small cell carcinoma in July of last year. She was last in the office in November at which time she was doing well.  She returns today for a scheduled followup visit has had a CT of the chest done. She states that in the interim she's been doing well she's not had any difficulties related to chest pain, shortness of breath, fatigue, weight loss or any other new complaints. She denies any cough or hemoptysis. She does have occasional rib pain although is becoming less frequent and less severe over time.  Current Outpatient Prescriptions  Medication Sig Dispense Refill  . albuterol-ipratropium (COMBIVENT) 18-103 MCG/ACT inhaler Inhale 2 puffs into the lungs every 6 (six) hours as needed.        Marland Kitchen aspirin 325 MG tablet Take 325 mg by mouth daily.        . metoprolol succinate (TOPROL-XL) 25 MG 24 hr tablet Take 25 mg by mouth daily. 75 mg po every day      . rosuvastatin (CRESTOR) 40 MG tablet Take 40 mg by mouth daily.         NKDA  Physical Exam: BP 124/76  Pulse 80  Resp 18  Ht 5\' 4"  (1.626 m)  Wt 183 lb (83.008 kg)  BMI 31.41 kg/m2  SpO2 96% General well-developed well-nourished in no acute distress Neuro alert and oriented x3, no focal deficits Cardiac regular rate and rhythm normal S1 and S2 no rubs or gallops Lungs diminished at left base otherwise clear. No cervical, supraclavicular, axillary, or epitrochlear adenopathy  Diagnostic Tests: CT of the chest is reviewed. It shows postoperative changes on the left. There is a small stable 6 mm subpleural nodule the right base which is unchanged. There is no evidence of recurrent disease  Impression: [name] is a 66 year old woman with a history of stage IB non-small cell carcinoma of left  upper lobe. She is now 6 months out from surgery no evidence recurrent disease.  Plan: Return in 3 months we will do a plain chest x-ray at that time. We'll do a repeat CT of the chest in 6 months at her one-year followup visit

## 2011-07-08 ENCOUNTER — Encounter: Payer: 59 | Admitting: Thoracic Surgery (Cardiothoracic Vascular Surgery)

## 2011-07-08 ENCOUNTER — Other Ambulatory Visit: Payer: Self-pay | Admitting: Thoracic Surgery (Cardiothoracic Vascular Surgery)

## 2011-07-08 DIAGNOSIS — Z85118 Personal history of other malignant neoplasm of bronchus and lung: Secondary | ICD-10-CM

## 2011-07-09 ENCOUNTER — Encounter: Payer: Self-pay | Admitting: Thoracic Surgery (Cardiothoracic Vascular Surgery)

## 2011-07-09 ENCOUNTER — Ambulatory Visit
Admission: RE | Admit: 2011-07-09 | Discharge: 2011-07-09 | Disposition: A | Discharge status: Home / Self Care | Payer: 59 | Source: Ambulatory Visit | Attending: Thoracic Surgery (Cardiothoracic Vascular Surgery) | Admitting: Thoracic Surgery (Cardiothoracic Vascular Surgery)

## 2011-07-09 ENCOUNTER — Ambulatory Visit (INDEPENDENT_AMBULATORY_CARE_PROVIDER_SITE_OTHER): Payer: 59 | Admitting: Thoracic Surgery (Cardiothoracic Vascular Surgery)

## 2011-07-09 VITALS — BP 122/76 | HR 75 | Resp 16 | Ht 64.0 in | Wt 190.0 lb

## 2011-07-09 DIAGNOSIS — Z09 Encounter for follow-up examination after completed treatment for conditions other than malignant neoplasm: Secondary | ICD-10-CM

## 2011-07-09 DIAGNOSIS — Z85118 Personal history of other malignant neoplasm of bronchus and lung: Secondary | ICD-10-CM

## 2011-07-09 DIAGNOSIS — Z951 Presence of aortocoronary bypass graft: Secondary | ICD-10-CM

## 2011-07-09 DIAGNOSIS — C3410 Malignant neoplasm of upper lobe, unspecified bronchus or lung: Secondary | ICD-10-CM | POA: Insufficient documentation

## 2011-07-09 NOTE — Progress Notes (Signed)
  HPI:  [name] returns today for a scheduled followup visit. She had emergency coronary bypass grafting on May 18 of last year. That time she was noted to have a left upper lobe nodule. She subsequently underwent a left upper lobectomy for stage IA non-small cell carcinoma on July 30. She was last seen in the office in February which time she was doing well with no evidence recurrent disease at her 6 month followup. In the interim she's been doing well with no complaints related to her heart or lungs and  Past Medical History  Diagnosis Date  . CAD (coronary artery disease)   . HTN (hypertension)   . Dyslipidemia   . Tobacco abuse   . GERD (gastroesophageal reflux disease)      Current Outpatient Prescriptions  Medication Sig Dispense Refill  . albuterol-ipratropium (COMBIVENT) 18-103 MCG/ACT inhaler Inhale 2 puffs into the lungs every 6 (six) hours as needed.        Marland Kitchen aspirin 325 MG tablet Take 325 mg by mouth daily.        . metoprolol succinate (TOPROL-XL) 25 MG 24 hr tablet Take 25 mg by mouth daily. 75 mg po every day      . rosuvastatin (CRESTOR) 40 MG tablet Take 40 mg by mouth daily.        Marland Kitchen lisinopril (PRINIVIL,ZESTRIL) 10 MG tablet         Physical Exam BP 122/76  Pulse 75  Resp 16  Ht 5\' 4"  (1.626 m)  Wt 190 lb (86.183 kg)  BMI 32.61 kg/m2  SpO2 95% Gen. well-developed well-nourished white female in no acute distress Neurologic alert and oriented x3 Lungs clear with essentially equal breath sounds bilaterally Cardiac regular rate and rhythm normal S1 and S2 no rubs murmurs or gallops No cervical, supraclavicular adenopathy Incisions well-healed left chest  Diagnostic Tests: Chest x-ray shows a volume loss due to previous lobectomy, no evidence recurrent disease  Impression: [name] is a 66 year old woman with a history of emergency coronary bypass grafting a year ago, followed by a left upper lobectomy for stage IA lung cancer 9 months ago. She's  doing very well on both counts presently. She does notice recurrent disease. I will plan to see her back in 3 months for her one-year followup visit, we'll do a CT of the chest at that time.

## 2011-09-03 ENCOUNTER — Other Ambulatory Visit: Payer: Self-pay | Admitting: Thoracic Surgery (Cardiothoracic Vascular Surgery)

## 2011-09-03 DIAGNOSIS — C3490 Malignant neoplasm of unspecified part of unspecified bronchus or lung: Secondary | ICD-10-CM

## 2011-09-06 ENCOUNTER — Other Ambulatory Visit: Payer: Self-pay | Admitting: Thoracic Surgery (Cardiothoracic Vascular Surgery)

## 2011-10-08 ENCOUNTER — Encounter: Payer: 59 | Admitting: Thoracic Surgery (Cardiothoracic Vascular Surgery)

## 2011-10-08 ENCOUNTER — Other Ambulatory Visit: Payer: 59

## 2011-10-15 ENCOUNTER — Ambulatory Visit
Admission: RE | Admit: 2011-10-15 | Discharge: 2011-10-15 | Disposition: A | Discharge status: Home / Self Care | Payer: 59 | Source: Ambulatory Visit | Attending: Thoracic Surgery (Cardiothoracic Vascular Surgery) | Admitting: Thoracic Surgery (Cardiothoracic Vascular Surgery)

## 2011-10-15 ENCOUNTER — Encounter: Payer: 59 | Admitting: Thoracic Surgery (Cardiothoracic Vascular Surgery)

## 2011-10-15 ENCOUNTER — Encounter: Payer: Self-pay | Admitting: Thoracic Surgery (Cardiothoracic Vascular Surgery)

## 2011-10-15 ENCOUNTER — Other Ambulatory Visit: Payer: Self-pay | Admitting: Thoracic Surgery (Cardiothoracic Vascular Surgery)

## 2011-10-15 ENCOUNTER — Ambulatory Visit (INDEPENDENT_AMBULATORY_CARE_PROVIDER_SITE_OTHER): Payer: 59 | Admitting: Thoracic Surgery (Cardiothoracic Vascular Surgery)

## 2011-10-15 VITALS — BP 155/79 | HR 72 | Resp 16 | Ht 64.0 in | Wt 193.0 lb

## 2011-10-15 DIAGNOSIS — Z85118 Personal history of other malignant neoplasm of bronchus and lung: Secondary | ICD-10-CM

## 2011-10-15 DIAGNOSIS — C3490 Malignant neoplasm of unspecified part of unspecified bronchus or lung: Secondary | ICD-10-CM

## 2011-10-15 DIAGNOSIS — Z09 Encounter for follow-up examination after completed treatment for conditions other than malignant neoplasm: Secondary | ICD-10-CM

## 2011-10-15 NOTE — Progress Notes (Signed)
HPI:  [name] is a 66 year old woman who presents for a scheduled one-year followup visit after resection of a stage IA non-small cell carcinoma upper lobe. She presented originally in May of last year with an acute MI and underwent emergent coronary bypass grafting were she was noted to have a left upper lobe nodule. She subsequently had a left upper lobectomy in late July. This turned out to be a stage IA lesion. She's been followed since then.  She states that her weight has been stable. She's not had any chest pain, pressure or tightness. She's not had any shortness of breath at rest or exertion. She denies cough or hemoptysis. She does have any neurologic symptoms. She has been having some pain in her right knee from arthritis. She has not resumed smoking.  Past Medical History  Diagnosis Date  . CAD (coronary artery disease)   . HTN (hypertension)   . Dyslipidemia   . Tobacco abuse   . GERD (gastroesophageal reflux disease)    History   Social History  . Marital Status: Married    Spouse Name: N/A    Number of Children: N/A  . Years of Education: N/A   Occupational History  . Not on file.   Social History Main Topics  . Smoking status: Former Smoker -- 1.0 packs/day    Types: Cigarettes  . Smokeless tobacco: Not on file  . Alcohol Use: No  . Drug Use: No  . Sexually Active: Not on file   Other Topics Concern  . Not on file   Social History Narrative  . No narrative on file    Current Outpatient Prescriptions  Medication Sig Dispense Refill  . albuterol-ipratropium (COMBIVENT) 18-103 MCG/ACT inhaler Inhale 2 puffs into the lungs every 6 (six) hours as needed.        Marland Kitchen aspirin 325 MG tablet Take 325 mg by mouth daily.        Marland Kitchen lisinopril (PRINIVIL,ZESTRIL) 10 MG tablet       . metoprolol succinate (TOPROL-XL) 25 MG 24 hr tablet Take 25 mg by mouth daily. 75 mg po every day      . rosuvastatin (CRESTOR) 40 MG tablet Take 40 mg by mouth daily.           Physical Exam BP 155/79  Pulse 72  Resp 16  Ht 5\' 4"  (1.626 m)  Wt 193 lb (87.544 kg)  BMI 33.13 kg/m2  SpO2 95% Gen. 66 year old woman in no acute distress, well-developed well-nourished HEENT unremarkable Neck no cervical or suprapubic or adenopathy Left chest and sternotomy incisions well-healed Equal breath sounds bilaterally Cardiac regular rate and rhythm normal S1-S2 no rubs or gallops No epitrochlear adenopathy No peripheral edema  Diagnostic Tests: CT of chest  10/15/11  IMPRESSION:  1. Status post left upper lobectomy without evidence of recurrent  or metastatic disease in the thorax.  2. There are two tiny 4 mm subpleural pulmonary nodules in the  right lung, as above, which are unchanged compared to 07/24/2010  and are strongly favored to represent benign subpleural lymph  nodes.  3. Atherosclerosis, including left main and three-vessel coronary  artery disease. The patient is status post CABG with a LIMA to  the LAD.  4. Small hiatal hernia.  5. Additional incidental findings, as detailed above.    Impression: 66 year old woman now year out from coronary bypass grafting and a left upper lobectomy for a stage Ia non-small cell carcinoma. She is doing well from a cardiac and  pulmonary standpoint. She has no evidence of recurrence of her lung cancer. She is committed to remaining a nonsmoker.   Plan: I'll plan see her back in 3 months with a plain chest x-ray PA and lateral to check on her progress.

## 2012-01-10 ENCOUNTER — Other Ambulatory Visit: Payer: Self-pay

## 2012-01-10 DIAGNOSIS — R0602 Shortness of breath: Secondary | ICD-10-CM

## 2012-01-10 MED ORDER — IPRATROPIUM-ALBUTEROL 18-103 MCG/ACT IN AERO: 2.0000 | INHALATION_SPRAY | Freq: Four times a day (QID) | RESPIRATORY_TRACT | Status: DC | PRN

## 2012-01-10 NOTE — Telephone Encounter (Signed)
Called in refill to [facility] for Combivent inhaler

## 2012-01-21 ENCOUNTER — Ambulatory Visit: Payer: 59 | Admitting: Thoracic Surgery (Cardiothoracic Vascular Surgery)

## 2012-01-24 ENCOUNTER — Other Ambulatory Visit: Payer: Self-pay | Admitting: *Deleted

## 2012-01-24 DIAGNOSIS — C3490 Malignant neoplasm of unspecified part of unspecified bronchus or lung: Secondary | ICD-10-CM

## 2012-01-24 DIAGNOSIS — F0280 Dementia in other diseases classified elsewhere without behavioral disturbance: Secondary | ICD-10-CM

## 2012-01-28 ENCOUNTER — Encounter: Payer: Self-pay | Admitting: Thoracic Surgery (Cardiothoracic Vascular Surgery)

## 2012-01-28 ENCOUNTER — Ambulatory Visit
Admission: RE | Admit: 2012-01-28 | Discharge: 2012-01-28 | Disposition: A | Discharge status: Home / Self Care | Payer: 59 | Source: Ambulatory Visit | Attending: Thoracic Surgery (Cardiothoracic Vascular Surgery) | Admitting: Thoracic Surgery (Cardiothoracic Vascular Surgery)

## 2012-01-28 ENCOUNTER — Ambulatory Visit (INDEPENDENT_AMBULATORY_CARE_PROVIDER_SITE_OTHER): Payer: 59 | Admitting: Thoracic Surgery (Cardiothoracic Vascular Surgery)

## 2012-01-28 ENCOUNTER — Ambulatory Visit: Payer: 59 | Admitting: Thoracic Surgery (Cardiothoracic Vascular Surgery)

## 2012-01-28 VITALS — BP 120/80 | HR 80 | Resp 18 | Ht 64.0 in | Wt 193.0 lb

## 2012-01-28 DIAGNOSIS — Z09 Encounter for follow-up examination after completed treatment for conditions other than malignant neoplasm: Secondary | ICD-10-CM

## 2012-01-28 DIAGNOSIS — Z951 Presence of aortocoronary bypass graft: Secondary | ICD-10-CM

## 2012-01-28 DIAGNOSIS — C3490 Malignant neoplasm of unspecified part of unspecified bronchus or lung: Secondary | ICD-10-CM

## 2012-01-28 DIAGNOSIS — F0280 Dementia in other diseases classified elsewhere without behavioral disturbance: Secondary | ICD-10-CM

## 2012-01-28 DIAGNOSIS — Z85118 Personal history of other malignant neoplasm of bronchus and lung: Secondary | ICD-10-CM

## 2012-01-28 NOTE — Progress Notes (Signed)
  HPI:  [name] returns today for a followup in regards to her lung cancer. She had emergency coronary bypass grafting in May of 2012. Time of surgery she was noted to have a left upper lobe nodule. She then had a left upper lobectomy for a non-small cell carcinoma in July 2012. She was last seen in the office in August at which time she was free of disease at 1 year.  Since her last visit she says she's been doing well she's not had any chest pain neither pleuritic or anginal. She's not had any trouble with her breathing. She does still occasionally uses a Combivent inhaler. She has an occasional cough but nothing out of the ordinary she has not had hemoptysis. Her weight has increased slightly. Overall she feels well.  Past Medical History  Diagnosis Date  . CAD (coronary artery disease)   . HTN (hypertension)   . Dyslipidemia   . Tobacco abuse   . GERD (gastroesophageal reflux disease)    Stage IA non-small cell lung cancer status post left upper lobectomy   Current Outpatient Prescriptions  Medication Sig Dispense Refill  . albuterol-ipratropium (COMBIVENT) 18-103 MCG/ACT inhaler Inhale 2 puffs into the lungs every 6 (six) hours as needed.  1 Inhaler  2  . aspirin 325 MG tablet Take 325 mg by mouth daily.        Marland Kitchen lisinopril (PRINIVIL,ZESTRIL) 2.5 MG tablet Take 2.5 mg by mouth daily.      . metoprolol succinate (TOPROL-XL) 25 MG 24 hr tablet Take 25 mg by mouth daily. 75 mg po every day      . rosuvastatin (CRESTOR) 40 MG tablet Take 40 mg by mouth daily.        Marland Kitchen lisinopril (PRINIVIL,ZESTRIL) 10 MG tablet         Physical Exam BP 120/80  Pulse 80  Resp 18  Ht 5\' 4"  (1.626 m)  Wt 193 lb (87.544 kg)  BMI 33.13 kg/m2  SpO2 97% Gen. 66 year old woman in no acute distress Neurologic alert and oriented x3 with no deficits HEENT unremarkable Neck no cervical or supraclavicular adenopathy Lungs clear with essentially equal breath sounds bilaterally Cardiac regular rate and  rhythm normal S1 and S2 no rubs or murmurs  Diagnostic Tests: Chest x-ray 01/28/2012 shows postoperative changes on the left as well as changes of sternotomy, no evidence recurrent disease  Impression: [name] is a 66 year old woman who is now almost a year and a half out from lobectomy for stage IA non-small cell carcinoma. She has no evidence recurrent disease. She's also a little over a year and a half out from coronary bypass grafting and has not had any recent cardiac issues.  I'll plan to see her back in about 3 months with a chest x-ray at that time and then will plan to do another CT scan in July which would be a 2 year followup visit  Plan: Returns 3 months with chest x-ray PA and lateral

## 2012-05-04 ENCOUNTER — Other Ambulatory Visit: Payer: Self-pay | Admitting: *Deleted

## 2012-05-04 DIAGNOSIS — I2510 Atherosclerotic heart disease of native coronary artery without angina pectoris: Secondary | ICD-10-CM

## 2012-05-05 ENCOUNTER — Ambulatory Visit
Admission: RE | Admit: 2012-05-05 | Discharge: 2012-05-05 | Disposition: A | Discharge status: Home / Self Care | Payer: Medicare Other | Source: Ambulatory Visit | Attending: Thoracic Surgery (Cardiothoracic Vascular Surgery) | Admitting: Thoracic Surgery (Cardiothoracic Vascular Surgery)

## 2012-05-05 ENCOUNTER — Ambulatory Visit (INDEPENDENT_AMBULATORY_CARE_PROVIDER_SITE_OTHER): Payer: Medicare Other | Admitting: Thoracic Surgery (Cardiothoracic Vascular Surgery)

## 2012-05-05 ENCOUNTER — Encounter: Payer: Self-pay | Admitting: Thoracic Surgery (Cardiothoracic Vascular Surgery)

## 2012-05-05 VITALS — BP 130/74 | HR 72 | Resp 20 | Ht 64.0 in | Wt 193.0 lb

## 2012-05-05 DIAGNOSIS — Z85118 Personal history of other malignant neoplasm of bronchus and lung: Secondary | ICD-10-CM

## 2012-05-05 DIAGNOSIS — Z9889 Other specified postprocedural states: Secondary | ICD-10-CM

## 2012-05-05 DIAGNOSIS — I2510 Atherosclerotic heart disease of native coronary artery without angina pectoris: Secondary | ICD-10-CM

## 2012-05-05 DIAGNOSIS — Z951 Presence of aortocoronary bypass graft: Secondary | ICD-10-CM

## 2012-05-05 DIAGNOSIS — Z902 Acquired absence of lung [part of]: Secondary | ICD-10-CM

## 2012-05-05 DIAGNOSIS — C3410 Malignant neoplasm of upper lobe, unspecified bronchus or lung: Secondary | ICD-10-CM

## 2012-05-05 NOTE — Progress Notes (Signed)
  HPI:  [name] returns today for a scheduled followup visit. I first met her in June of 2012 limited emergency coronary bypass grafting. During the mammary artery harvest she was noted to have a left upper lobe mass. We subsequently had her come back a month later for a left upper lobectomy for what turned out to be a non-small cell cancer. She recovered extremely well from both operations, his continued to do well since. She states that she is doing well. She's not had any chest pain, shortness of breath, cough, hemoptysis, weight loss, change in activity, new bone or joint pain, or unusual headaches or visual changes.  Past Medical History  Diagnosis Date  . CAD (coronary artery disease)   . HTN (hypertension)   . Dyslipidemia   . Tobacco abuse   . GERD (gastroesophageal reflux disease)     History   Social History  . Marital Status: Married    Spouse Name: N/A    Number of Children: N/A  . Years of Education: N/A   Occupational History  . Not on file.   Social History Main Topics  . Smoking status: Former Smoker -- 1.00 packs/day    Types: Cigarettes  . Smokeless tobacco: Not on file  . Alcohol Use: No  . Drug Use: No  . Sexually Active: Not on file   Other Topics Concern  . Not on file   Social History Narrative  . No narrative on file       Current Outpatient Prescriptions  Medication Sig Dispense Refill  . albuterol-ipratropium (COMBIVENT) 18-103 MCG/ACT inhaler Inhale 2 puffs into the lungs every 6 (six) hours as needed.  1 Inhaler  2  . aspirin 325 MG tablet Take 325 mg by mouth daily.        Marland Kitchen lisinopril (PRINIVIL,ZESTRIL) 2.5 MG tablet Take 2.5 mg by mouth daily.      . metoprolol succinate (TOPROL-XL) 25 MG 24 hr tablet Take 25 mg by mouth daily. 75 mg po every day      . rosuvastatin (CRESTOR) 40 MG tablet Take 40 mg by mouth daily.         No current facility-administered medications for this visit.   Review of systems  See history of present  illness  Physical Exam BP 130/74  Pulse 72  Resp 20  Ht 5\' 4"  (1.626 m)  Wt 193 lb (87.544 kg)  BMI 33.11 kg/m2  SpO68 24% [name] in no acute distress General well-developed well-nourished Neurologic alert and oriented x3 no focal deficits Cardiac regular rate and rhythm normal S1 and S2 no rubs or murmurs or gallops Lungs clear with equal breath sounds bilaterally No cervical or supraclavicular or epitrochlear adenopathy  Diagnostic Tests: Chest x-ray 05/05/2012  No active disease  Impression: [name] now 20 months out from left upper lobectomy for stage IA non-small cell carcinoma. She has no evidence of recurrent disease. I will plan see her back in 4 months for her 2 year followup visit. We will do a CT of the chest at that time.  She also continues to do well from a cardiac standpoint with no recurrent angina.  Plan: Return 4 months with CT of chest

## 2012-07-20 ENCOUNTER — Encounter: Payer: Self-pay | Admitting: Pharmacist Clinician (PhC)/ Clinical Pharmacy Specialist

## 2012-07-21 ENCOUNTER — Ambulatory Visit (INDEPENDENT_AMBULATORY_CARE_PROVIDER_SITE_OTHER): Payer: Medicare Other | Admitting: Cardiovascular Disease

## 2012-07-21 ENCOUNTER — Encounter: Payer: Self-pay | Admitting: Cardiovascular Disease

## 2012-07-21 VITALS — BP 138/64 | HR 75

## 2012-07-21 DIAGNOSIS — R5381 Other malaise: Secondary | ICD-10-CM

## 2012-07-21 DIAGNOSIS — R002 Palpitations: Secondary | ICD-10-CM

## 2012-07-21 DIAGNOSIS — E8881 Metabolic syndrome: Secondary | ICD-10-CM

## 2012-07-21 DIAGNOSIS — R5383 Other fatigue: Secondary | ICD-10-CM

## 2012-07-21 DIAGNOSIS — E782 Mixed hyperlipidemia: Secondary | ICD-10-CM

## 2012-07-21 DIAGNOSIS — I119 Hypertensive heart disease without heart failure: Secondary | ICD-10-CM

## 2012-07-21 DIAGNOSIS — I2510 Atherosclerotic heart disease of native coronary artery without angina pectoris: Secondary | ICD-10-CM

## 2012-07-21 DIAGNOSIS — R7309 Other abnormal glucose: Secondary | ICD-10-CM

## 2012-07-21 DIAGNOSIS — I10 Essential (primary) hypertension: Secondary | ICD-10-CM

## 2012-07-21 NOTE — Progress Notes (Signed)
Patient ID: [name], female   DOB: December 20, 1945, 67 y.o.   MRN: [id number]   HPI: [name], is a 67 y.o. female who presents to the office today for seven-month cardiology evaluation. [name] suffered initial anterior wall myocardial infarction with ventricular fibrillation arrest requiring resuscitation and PTCA of her LAD in 1997. On 07/05/2010 she presented with increasing shortness of breath and was found to have critical life threatening anatomy with 99% left main equivalent disease and later that afternoon. Underwent CABG revascularization surgery by Dr. Dorris Fetch. LIMA placed to the LAD, a vein graft to the second branch of the obtuse marginal vessel, and vein graft to the PDA. Of note, subsequent chest x-rays disclosed a left upper lung nodule and ultimately this proved to be adenocarcinoma for which he ultimately underwent upper lobe resection in July 2012. Additional problems include hypertension, hyperlipidemia, obesity and probable metabolic syndrome. The perfusion study in July 2012 show a post stress ejection fraction of 46% but otherwise fairly normal perfusion. Heart her CABG surgery and cath ejection fraction was approximately 35%. She has not had an echo Doppler study done since her bypass.  Over the past 6 months, she admits to doing fairly well. He does note an occasional palpitation. She does note some mild shortness of breath.  Laboratory in December 2013 an NMR lipoprotein she was found to have an LDL of 50 but still had increased LDL particle #[id number] 22 increased small LDL particle number is 761. Her insulin resistance was significantly elevated at 70. HDL particle number was low at 26.8 and a triglyceride of 152. She presents now for seven-month cardiology evaluation.  Past Medical History  Diagnosis Date  . CAD (coronary artery disease)   . HTN (hypertension)   . Dyslipidemia   . Tobacco abuse   . GERD (gastroesophageal reflux disease)   . Cancer   .  Non-small cell lung cancer 08/2010    Stage IA, status post left upper lobectomy July 2012  . Myocardial infarct     Past Surgical History  Procedure Laterality Date  . Left vats  09/17/2010  . Coronary artery bypass graft  07/11/2010    LIMA to LAD; SVG to 2nd branch OM; SVG to posterior descending artery  . Tee without cardioversion  07/06/2010    during emergent CABG surgery; 2-3+ mitral regurgitation  . Cardiovascular stress test  09/11/2010    R/S MV - normal perfusion in all regions, EF 46%, no scintigraphic evidence of inducible myocardial ischemia; global LV systolic function mildly reduced; no significant wall motion abnormalities noted; Exercise capacity 7 METS; EKG negative for ischemia; low risk study, no signifcant change from previous study 08/2003  . Cardiac catheterization  07/06/2010    see CABG report - pt sent to OR    No Known Allergies  Current Outpatient Prescriptions  Medication Sig Dispense Refill  . furosemide (LASIX) 20 MG tablet Take 20 mg by mouth as needed.      Marland Kitchen albuterol-ipratropium (COMBIVENT) 18-103 MCG/ACT inhaler Inhale 2 puffs into the lungs every 6 (six) hours as needed.  1 Inhaler  2  . aspirin 325 MG tablet Take 325 mg by mouth daily.        Marland Kitchen lisinopril (PRINIVIL,ZESTRIL) 2.5 MG tablet Take 2.5 mg by mouth daily.      . metoprolol succinate (TOPROL-XL) 25 MG 24 hr tablet Take 25 mg by mouth daily. 75 mg po every day      . rosuvastatin (CRESTOR) 40 MG  tablet Take 40 mg by mouth daily.         No current facility-administered medications for this visit.    Also she is married has 3 children 5 grandchildren. She is an ex-smoker. She does not routinely exercise. There is no alcohol use.  ROS is negative for recent fever chills or night sweats. She has not been successful in weight loss. Note a rare to occasional palpitation. Times there is a mild shortness of breath with activity. She denies chest pressure. She denies bleeding. He denies wheezing. At  times there is some mild leg swelling. She denies paresthesias. Other system review is negative.  PE BP 138/64  Pulse 75  General: Alert, oriented, no distress.  HEENT: Normocephalic, atraumatic. Pupils round and reactive; sclera anicteric;  Nose without nasal septal hypertrophy Mouth/Parynx benign; Mallinpatti scale 2/3 Neck: No JVD, no carotid briuts Lungs: clear to ausculatation and percussion; no wheezing or rales Heart: RRR, s1 s2 normal 1/6 systolic murmur. Abdomen: Mildly protuberant with mild central adiposity. soft, nontender; no hepatosplenomehaly, BS+; abdominal aorta nontender and not dilated by palpation. Pulses 2+ Extremities: no clubbinbg cyanosis or edema, Homan's sign negative  Neurologic: grossly nonfocal  ECG: Sinus rhythm with T-wave abnormality anteriorly V1 through V5 unchanged the  LABS:  BMET    Component Value Date/Time   NA 137 09/22/2010 0510   K 3.5 09/22/2010 0510   CL 101 09/22/2010 0510   CO2 28 09/22/2010 0510   GLUCOSE 103* 09/22/2010 0510   BUN 7 09/22/2010 0510   CREATININE 0.62 09/22/2010 0510   CALCIUM 8.9 09/22/2010 0510   GFRNONAA >60 09/22/2010 0510   GFRAA >60 09/22/2010 0510     Hepatic Function Panel     Component Value Date/Time   PROT 5.7* 09/19/2010 0419   ALBUMIN 2.7* 09/19/2010 0419   AST 60* 09/19/2010 0419   ALT 63* 09/19/2010 0419   ALKPHOS 78 09/19/2010 0419   BILITOT 0.5 09/19/2010 0419     CBC    Component Value Date/Time   WBC 11.2* 09/21/2010 0400   RBC 3.70* 09/21/2010 0400   HGB 10.5* 09/21/2010 0400   HCT 32.0* 09/21/2010 0400   PLT 229 09/21/2010 0400   MCV 86.5 09/21/2010 0400   MCH 28.4 09/21/2010 0400   MCHC 32.8 09/21/2010 0400   RDW 14.7 09/21/2010 0400   LYMPHSABS 2.0 07/06/2010 0302   MONOABS 1.7* 07/06/2010 0302   EOSABS 0.0 07/06/2010 0302   BASOSABS 0.0 07/06/2010 0302     BNP    Component Value Date/Time   PROBNP 10535.0* 07/10/2010 0400    Lipid Panel  No results found for this basename: chol, trig, hdl, cholhdl, vldl,  ldlcalc     RADIOLOGY: No results found.    ASSESSMENT AND PLAN: Ms. [name] is now 18 years following her initial anterior wall myocardial infarction with VF arrest for which she underwent PTCA emergently of her LAD. She is 2 years following emergency BG surgery for life-threatening anatomy with 99% left main disease and total occlusion of her RCA at the ostium with left to right collaterals. Laboratory in December suggests metabolic syndrome and despite a low LDL her portable number was still elevated. She has been on Crestor 40 mg per presently, I am recommending further titration of her Toprol 100 mg daily. I am rechecking completes her laboratory in the fasting state and will check a CBC, comprehensive metabolic panel, hemoglobin A1c, NMR lipid panel and TSH level. I'm scheduling her for a 2-D echo  Doppler study as well as Lexiscan Myoview study to reassess LV function coronary perfusion and valvular architecture. I will see her back in the office in 2 months for cardiology followup evaluation.     Lennette Bihari, MD, [facility]  07/21/2012 9:44 AM

## 2012-07-21 NOTE — Patient Instructions (Signed)
Your physician has recommended you make the following change in your medication: METOPROLOL increased to 100 mg daily.  Your physician has requested that you have an echocardiogram. Echocardiography is a painless test that uses sound waves to create images of your heart. It provides your doctor with information about the size and shape of your heart and how well your heart's chambers and valves are working. This procedure takes approximately one hour. There are no restrictions for this procedure.  Your physician has requested that you have a lexiscan myoview. For further information please visit [email or web address] Please follow instruction sheet, as given.  Your physician recommends that you return for lab work  Fasting.  Your physician recommends that you schedule a follow-up appointment in: August 2014.

## 2012-07-22 LAB — COMPREHENSIVE METABOLIC PANEL
AST: 22 U/L (ref 0–37)
Albumin: 4.1 g/dL (ref 3.5–5.2)
Alkaline Phosphatase: 76 U/L (ref 39–117)
BUN: 13 mg/dL (ref 6–23)
Calcium: 9.2 mg/dL (ref 8.4–10.5)
Chloride: 108 mEq/L (ref 96–112)
Potassium: 5.7 mEq/L — ABNORMAL HIGH (ref 3.5–5.3)
Sodium: 142 mEq/L (ref 135–145)
Total Protein: 6.4 g/dL (ref 6.0–8.3)

## 2012-07-22 LAB — CBC
Platelets: 221 10*3/uL (ref 150–400)
RDW: 14 % (ref 11.5–15.5)
WBC: 8.8 10*3/uL (ref 4.0–10.5)

## 2012-07-23 LAB — NMR LIPOPROFILE WITH LIPIDS
Cholesterol, Total: 114 mg/dL (ref <200)
HDL Particle Number: 35.6 umol/L (ref >=30.5)
LDL Size: 19.7 nm — ABNORMAL LOW (ref >20.5)
LP-IR Score: 60 — ABNORMAL HIGH (ref <=45)
Large HDL-P: 5.8 umol/L (ref >=4.8)
Large VLDL-P: 5.9 nmol/L — ABNORMAL HIGH (ref <=2.7)
Small LDL Particle Number: 663 nmol/L — ABNORMAL HIGH (ref <=527)

## 2012-08-04 ENCOUNTER — Ambulatory Visit (HOSPITAL_COMMUNITY)
Admission: RE | Admit: 2012-08-04 | Discharge: 2012-08-04 | Disposition: A | Discharge status: Home / Self Care | Payer: Medicare Other | Source: Ambulatory Visit | Attending: Cardiovascular Disease | Admitting: Cardiovascular Disease

## 2012-08-04 ENCOUNTER — Ambulatory Visit (HOSPITAL_BASED_OUTPATIENT_CLINIC_OR_DEPARTMENT_OTHER)
Admission: RE | Admit: 2012-08-04 | Discharge: 2012-08-04 | Disposition: A | Discharge status: Home / Self Care | Payer: Medicare Other | Source: Ambulatory Visit | Attending: Cardiovascular Disease | Admitting: Cardiovascular Disease

## 2012-08-04 DIAGNOSIS — I10 Essential (primary) hypertension: Secondary | ICD-10-CM | POA: Insufficient documentation

## 2012-08-04 DIAGNOSIS — I2510 Atherosclerotic heart disease of native coronary artery without angina pectoris: Secondary | ICD-10-CM

## 2012-08-04 DIAGNOSIS — Z951 Presence of aortocoronary bypass graft: Secondary | ICD-10-CM | POA: Insufficient documentation

## 2012-08-04 DIAGNOSIS — R9431 Abnormal electrocardiogram [ECG] [EKG]: Secondary | ICD-10-CM | POA: Insufficient documentation

## 2012-08-04 DIAGNOSIS — I119 Hypertensive heart disease without heart failure: Secondary | ICD-10-CM

## 2012-08-04 DIAGNOSIS — R0609 Other forms of dyspnea: Secondary | ICD-10-CM | POA: Insufficient documentation

## 2012-08-04 DIAGNOSIS — Z91199 Patient's noncompliance with other medical treatment and regimen due to unspecified reason: Secondary | ICD-10-CM | POA: Insufficient documentation

## 2012-08-04 DIAGNOSIS — R002 Palpitations: Secondary | ICD-10-CM

## 2012-08-04 DIAGNOSIS — I252 Old myocardial infarction: Secondary | ICD-10-CM | POA: Insufficient documentation

## 2012-08-04 DIAGNOSIS — Z9119 Patient's noncompliance with other medical treatment and regimen: Secondary | ICD-10-CM | POA: Insufficient documentation

## 2012-08-04 DIAGNOSIS — R0989 Other specified symptoms and signs involving the circulatory and respiratory systems: Secondary | ICD-10-CM | POA: Insufficient documentation

## 2012-08-04 MED ORDER — REGADENOSON 0.4 MG/5ML IV SOLN
0.4000 mg | Freq: Once | INTRAVENOUS | Status: AC
  Administered 2012-08-04: 0.4 mg via INTRAVENOUS

## 2012-08-04 MED ORDER — TECHNETIUM TC 99M SESTAMIBI GENERIC - CARDIOLITE
10.1000 | Freq: Once | INTRAVENOUS | Status: AC | PRN
  Administered 2012-08-04: 10.1 via INTRAVENOUS

## 2012-08-04 MED ORDER — TECHNETIUM TC 99M SESTAMIBI GENERIC - CARDIOLITE
32.2000 | Freq: Once | INTRAVENOUS | Status: AC | PRN
  Administered 2012-08-04: 32.2 via INTRAVENOUS

## 2012-08-04 NOTE — Procedures (Addendum)
[facility] [address] [place] [address] [place] [address] [phone]  Cardiology Nuclear Med Study  [name] is a 67 y.o. female     MRN : [id number]     DOB: 1945-06-24  Procedure Date: 08/04/2012  Nuclear Med Background Indication for Stress Test:  Graft Patency and Abnormal EKG History:  CAD;MI-1997;CABG X3--06/2010;PTCA--1997 Cardiac Risk Factors: Family History - CAD, History of Smoking, Hypertension, Lipids and Obesity  Symptoms:  DOE, Palpitations and SOB   Nuclear Pre-Procedure Caffeine/Decaff Intake:  7:00pm NPO After: 5:00am   IV Site: R Forearm  IV 0.9% NS with Angio Cath:  22g  Chest Size (in):  N/A IV Started by: Emmit Pomfret, RN  Height: 5\' 4"  (1.626 m)  Cup Size: B  BMI:  Body mass index is 34.31 kg/(m^2). Weight:  200 lb (90.719 kg)   Tech Comments:  N/A    Nuclear Med Study 1 or 2 day study: 1 day  Stress Test Type:  Lexiscan  Order Authorizing Provider:  Nicki Guadalajara, MD   Resting Radionuclide: Technetium 20m Sestamibi  Resting Radionuclide Dose: 10.1 mCi   Stress Radionuclide:  Technetium 72m Sestamibi  Stress Radionuclide Dose: 32.2 mCi           Stress Protocol Rest HR: 64 Stress HR: 94  Rest BP: 127/64 Stress BP:141/53  Exercise Time (min): n/a METS: n/a          Dose of Adenosine (mg):  n/a Dose of Lexiscan: 0.4 mg  Dose of Atropine (mg): n/a Dose of Dobutamine: n/a mcg/kg/min (at max HR)  Stress Test Technologist: Ernestene Mention, CCT Nuclear Technologist: Gonzella Lex, CNMT   Rest Procedure:  Myocardial perfusion imaging was performed at rest 45 minutes following the intravenous administration of Technetium 26m Sestamibi. Stress Procedure:  The patient received IV Lexiscan 0.4 mg over 15-seconds.  Technetium 37m Sestamibi injected at 30-seconds.  There were no significant changes with Lexiscan.  Quantitative spect images were obtained after a 45 minute delay.  Transient Ischemic  Dilatation (Normal <1.22):  1.16 Lung/Heart Ratio (Normal <0.45):  0.33 QGS EDV:  88 ml QGS ESV:  39 ml LV Ejection Fraction: 56%  Rest ECG: NSR - Normal EKG  Stress ECG: No significant change from baseline ECG  QPS Raw Data Images:  Normal; no motion artifact; normal heart/lung ratio. Stress Images:  Small anteroapical breast attenuation artifact. Rest Images:  Small anteroapical breast attenuation artifact. Subtraction (SDS):  No evidence of ischemia.  Impression Exercise Capacity:  Lexiscan with no exercise. BP Response:  Normal blood pressure response. Clinical Symptoms:  No significant symptoms noted. ECG Impression:  No significant ECG changes with Lexiscan. Comparison with Prior Nuclear Study: No significant change from previous study in 2012  Overall Impression:  Low risk stress nuclear study without reversible ischemia..  LV Wall Motion:  NL LV Function; NL Wall Motion or EF 56%.  Chrystie Nose, MD, [facility] Board Certified in Nuclear Cardiology Attending Cardiologist [facility]  Chrystie Nose, MD  08/04/2012 6:22 PM

## 2012-08-04 NOTE — Progress Notes (Signed)
[facility]   2D echo completed 08/04/2012.   Cindy Eulala Newcombe, RDCS  

## 2012-08-11 NOTE — Progress Notes (Signed)
Patient notified of Nuc and lab results. Instructed to d/c lisinopril. b-met in 5 days. Patient out of town. oeder faxed to [facility] [phone] per her request.S he will be out of town approx. until July 8th.

## 2012-08-25 ENCOUNTER — Encounter: Payer: Self-pay | Admitting: Cardiovascular Disease

## 2012-08-26 ENCOUNTER — Telehealth: Payer: Self-pay | Admitting: *Deleted

## 2012-08-26 MED ORDER — FUROSEMIDE 20 MG PO TABS: 20.0000 mg | ORAL_TABLET | ORAL | Status: DC | PRN

## 2012-08-26 NOTE — Telephone Encounter (Signed)
Returned call.  Verified Rx and dose and informed refill will be sent to pharmacy.  Pt also informed of refill process and verbalized understanding.

## 2012-09-04 ENCOUNTER — Other Ambulatory Visit: Payer: Self-pay | Admitting: *Deleted

## 2012-09-04 ENCOUNTER — Telehealth: Payer: Self-pay | Admitting: Cardiovascular Disease

## 2012-09-04 MED ORDER — METOPROLOL SUCCINATE ER 100 MG PO TB24: 100.0000 mg | ORAL_TABLET | Freq: Every day | ORAL | Status: DC

## 2012-09-04 MED ORDER — ROSUVASTATIN CALCIUM 40 MG PO TABS: 40.0000 mg | ORAL_TABLET | Freq: Every day | ORAL | Status: DC

## 2012-09-04 MED ORDER — METOPROLOL SUCCINATE ER 25 MG PO TB24: ORAL_TABLET | ORAL | Status: DC

## 2012-09-04 NOTE — Telephone Encounter (Signed)
Pharmacist just received an electronic Rx for Metoporol ER 25mg .  Patient had been taking 50 mg.  Please clarify.

## 2012-09-04 NOTE — Telephone Encounter (Signed)
V.O. Per Dr. Tresa Endo increase to 100 mg per his last office note. Metoprolol ER 100 mg e-scribed to pharmacy.

## 2012-09-04 NOTE — Telephone Encounter (Signed)
Returned call to pharmacy.  On hold x 5 mins.  Call ended.  Will forward to Dr. Pierre Bali, CMA to review and send in correct script.  Paper chart states pt taking 75 mg (1 and 1/2 tabs) daily, ;pharmacy stated 50 mg daily and last OV note stated increased to 100 mg daily.  Please clarity correct dose of Toprol XL.  Message forwarded to Dr. Pierre Bali, CMA.

## 2012-09-04 NOTE — Telephone Encounter (Signed)
[facility]

## 2012-09-08 ENCOUNTER — Encounter: Payer: Self-pay | Admitting: *Deleted

## 2012-09-14 ENCOUNTER — Encounter: Payer: Self-pay | Admitting: Cardiovascular Disease

## 2012-09-17 NOTE — Telephone Encounter (Signed)
Message forwarded to Scheduling to contact pt on cell [phone] to set up appt.

## 2012-09-20 ENCOUNTER — Encounter: Payer: Self-pay | Admitting: Thoracic Surgery (Cardiothoracic Vascular Surgery)

## 2012-09-21 ENCOUNTER — Other Ambulatory Visit: Payer: Self-pay | Admitting: *Deleted

## 2012-09-23 ENCOUNTER — Other Ambulatory Visit: Payer: Self-pay

## 2012-09-23 ENCOUNTER — Other Ambulatory Visit: Payer: Self-pay | Admitting: *Deleted

## 2012-09-24 ENCOUNTER — Telehealth: Payer: Self-pay | Admitting: Cardiovascular Disease

## 2012-09-24 NOTE — Telephone Encounter (Signed)
Needs a prescription for her pneumonia  And shingles shots.Please fax these prescriptions to-[phone].

## 2012-09-24 NOTE — Telephone Encounter (Signed)
Spoke w/ Burna Mortimer, CMA w/ Dr. Tresa Endo.  Advised Dr. Tresa Endo leaves that to PCP.  Returned call and informed pt.  Pt stated she doesn't have a PCP and is in [place] caring for her sick parents.  Stated she can get the shots for free there, but they need a prescription.  Would like Rx faxed to number listed below.  Informed Dr. Pierre Bali, CMA will be notified to discuss when Dr. Tresa Endo is in clinic tomorrow.  Pt verbalized understanding and agreed w/ plan.  Message forwarded to Dr. Pierre Bali, CMA.

## 2012-09-25 NOTE — Telephone Encounter (Signed)
ok 

## 2012-09-25 NOTE — Telephone Encounter (Signed)
Message forwarded to W. Waddell, CMA.  

## 2012-09-28 NOTE — Telephone Encounter (Signed)
Faxed pneumonia and shingles order per [facility] to [phone].

## 2012-09-29 ENCOUNTER — Ambulatory Visit: Payer: Medicare Other | Admitting: Thoracic Surgery (Cardiothoracic Vascular Surgery)

## 2012-09-30 ENCOUNTER — Telehealth: Payer: Self-pay | Admitting: *Deleted

## 2012-09-30 NOTE — Telephone Encounter (Signed)
Faxed order  For pneumonia and shingle vaccine.

## 2012-10-13 ENCOUNTER — Encounter: Payer: Self-pay | Admitting: Thoracic Surgery (Cardiothoracic Vascular Surgery)

## 2012-10-13 ENCOUNTER — Ambulatory Visit
Admission: RE | Admit: 2012-10-13 | Discharge: 2012-10-13 | Disposition: A | Discharge status: Home / Self Care | Payer: Medicare Other | Source: Ambulatory Visit | Attending: Thoracic Surgery (Cardiothoracic Vascular Surgery) | Admitting: Thoracic Surgery (Cardiothoracic Vascular Surgery)

## 2012-10-13 ENCOUNTER — Ambulatory Visit (INDEPENDENT_AMBULATORY_CARE_PROVIDER_SITE_OTHER): Payer: Medicare Other | Admitting: Thoracic Surgery (Cardiothoracic Vascular Surgery)

## 2012-10-13 VITALS — BP 157/82 | HR 72 | Resp 20 | Ht 64.0 in | Wt 203.0 lb

## 2012-10-13 DIAGNOSIS — Z85118 Personal history of other malignant neoplasm of bronchus and lung: Secondary | ICD-10-CM

## 2012-10-13 NOTE — Progress Notes (Signed)
HPI:  [name] returns today for a scheduled followup visit. I first met her in June of 2012. She was having an urgent catheterization for an acute MI and required emergency coronary bypass grafting. During the mammary artery harvest I found a left upper lobe mass. After she recovered from CABG we did a left upper lobectomy. The mass turned out to be a non-small cell cancer. She recovered extremely well from both operations. She was last seen in the office in March at which time she was doing well.  She is feeling well. She's not had any chest pain, shortness of breath, cough, hemoptysis, weight loss, change in activity, new bone or joint pain, or unusual headaches or visual changes. She does say that she feels tired at times. She spends a considerable amount of time taking care of her 66 year old and 66 year old parents.  Past Medical History  Diagnosis Date  . CAD (coronary artery disease)   . HTN (hypertension)   . Dyslipidemia   . Tobacco abuse   . GERD (gastroesophageal reflux disease)   . Cancer   . Non-small cell lung cancer 08/2010    Stage IA, status post left upper lobectomy July 2012  . Myocardial infarct        Current Outpatient Prescriptions  Medication Sig Dispense Refill  . albuterol-ipratropium (COMBIVENT) 18-103 MCG/ACT inhaler Inhale 2 puffs into the lungs every 6 (six) hours as needed.  1 Inhaler  2  . aspirin 325 MG tablet Take 325 mg by mouth daily.        . furosemide (LASIX) 20 MG tablet Take 1 tablet (20 mg total) by mouth as needed.  30 tablet  5  . metoprolol succinate (TOPROL-XL) 100 MG 24 hr tablet Take 1 tablet (100 mg total) by mouth daily. Take with or immediately following a meal.  30 tablet  6  . rosuvastatin (CRESTOR) 40 MG tablet Take 1 tablet (40 mg total) by mouth daily.  11 tablet  6   No current facility-administered medications for this visit.    Physical Exam BP 157/82  Pulse 72  Resp 20  Ht 5\' 4"  (1.626 m)  Wt 203 lb (92.08 kg)   BMI 34.83 kg/m2  SpO17 60% 67 year old woman in no acute distress Well-developed well-nourished No cervical or supraclavicular adenopathy Cardiac regular rate and rhythm normal S1 and S2 Lungs clear with slightly diminished breath sounds on the left Incisions well healed  Diagnostic Tests: CT of chest 10/13/12 *RADIOLOGY REPORT*  Clinical Data: Lung cancer. Cough.  CT CHEST WITHOUT CONTRAST  Technique: Multidetector CT imaging of the chest was performed  following the standard protocol without IV contrast.  Comparison: 10/15/2011.  Findings: No pathologically enlarged mediastinal, hilar or axillary  lymph nodes. Heart size normal. No pericardial effusion.  Postoperative changes of left upper lobectomy with associated  scarring and volume loss, stable. Scattered subpleural nodules in  the right hemithorax measure 4 mm or less in size, are stable and  likely represent subpleural lymph nodes. No pleural fluid. Airway  is otherwise unremarkable.  Incidental imaging of the upper abdomen shows an 8 mm low  attenuation lesion in the left hepatic lobe, stable. Adrenal  glands are unremarkable. Ventral hernia is seen just below the  xyphoid process and contains fat. No worrisome lytic or sclerotic  lesions. Thoracotomy changes on the left.  IMPRESSION:  Postoperative changes of left upper lobectomy without evidence of  recurrent or metastatic disease.  Original Report Authenticated By: Leanna Battles, M.D.  Impression: 67 year old woman who is now 2 years out from emergency CABG and a subsequent thoracoscopic left upper lobectomy. She's doing well from the standpoint of both operations. She had an echo recently which showed an EF of 50-55% with some anterior and apical scarring. She's not having any anginal symptoms. She has no evidence recurrent disease following resection for a lung cancer. She is now over 2 years out from that, so that is quite a milestone.  Plan: Return in 6 months  with CT of chest

## 2012-10-15 ENCOUNTER — Encounter: Payer: Self-pay | Admitting: Cardiovascular Disease

## 2012-10-15 ENCOUNTER — Ambulatory Visit (INDEPENDENT_AMBULATORY_CARE_PROVIDER_SITE_OTHER): Payer: Medicare Other | Admitting: Cardiovascular Disease

## 2012-10-15 VITALS — BP 140/80 | HR 62 | Ht 64.0 in | Wt 203.0 lb

## 2012-10-15 DIAGNOSIS — I2510 Atherosclerotic heart disease of native coronary artery without angina pectoris: Secondary | ICD-10-CM

## 2012-10-15 DIAGNOSIS — Z79899 Other long term (current) drug therapy: Secondary | ICD-10-CM

## 2012-10-15 DIAGNOSIS — E785 Hyperlipidemia, unspecified: Secondary | ICD-10-CM

## 2012-10-15 DIAGNOSIS — E039 Hypothyroidism, unspecified: Secondary | ICD-10-CM

## 2012-10-15 DIAGNOSIS — I10 Essential (primary) hypertension: Secondary | ICD-10-CM

## 2012-10-15 LAB — BASIC METABOLIC PANEL
CO2: 26 mEq/L (ref 19–32)
Calcium: 9.6 mg/dL (ref 8.4–10.5)
Chloride: 107 mEq/L (ref 96–112)
Creat: 0.9 mg/dL (ref 0.50–1.10)
Glucose, Bld: 82 mg/dL (ref 70–99)
Sodium: 141 mEq/L (ref 135–145)

## 2012-10-15 NOTE — Patient Instructions (Addendum)
Your physician recommends that you schedule a follow-up appointment in: 6 MONTHS.  Your physician recommends that you return for lab work today.

## 2012-10-16 LAB — T4, FREE: Free T4: 1.06 ng/dL (ref 0.80–1.80)

## 2012-11-14 ENCOUNTER — Encounter: Payer: Self-pay | Admitting: Cardiovascular Disease

## 2012-11-14 NOTE — Progress Notes (Signed)
Patient ID: [name], female   DOB: Dec 18, 1945, 67 y.o.   MRN: [id number]     HPI: [name], is a 67 y.o. female who presents to the office today for follow up cardiology evaluation.   [name] suffered initial anterior wall myocardial infarction with ventricular fibrillation arrest requiring resuscitation and PTCA of her LAD in 1997. On 07/05/2010 she presented with increasing shortness of breath and was found to have critical life threatening anatomy with 99% left main equivalent disease.   Later that afternoon she nderwent emergent  CABG revascularization surgery by Dr. Dorris Fetch with a LIMA placed to the LAD, a vein graft to the second branch of the obtuse marginal vessel, and vein graft to the PDA. Of note, subsequent chest x-rays disclosed a left upper lung nodule and ultimately this proved to be adenocarcinoma for which he ultimately underwent upper lobe resection in July 2012. Additional problems include hypertension, hyperlipidemia, obesity and probable metabolic syndrome. The perfusion study in July 2012 show a post stress ejection fraction of 46% but otherwise fairly normal perfusion. Heart her CABG surgery and cath ejection fraction was approximately 35%. She has not had an echo Doppler study done since her bypass.   Laboratory in December 2013 an NMR lipoprotein she was found to have an LDL of 50 but still had increased LDL particle #[id number] 22 increased small LDL particle number is 761. Her insulin resistance was significantly elevated at 70. HDL particle number was low at 26.8 and a triglyceride of 152. She presents now for seven-month cardiology evaluation.  When I last saw her several months ago I recommended further titration of her beta blocker, laboratory, as well as reassessment of LV function and myocardial perfusion. An echo Doppler study was done 08/04/2012 which showed an ejection fraction at 50 - 55%. She had mild LVH. There was grade 1 diastolic dysfunction.  There is evidence for distal anterior, anteroseptal and apical LAD scar. There is mild tricuspid regurgitation and mild mitral regurgitation. PA pressure estimated 32 mm.  In LexiScan study was low risk and suggested possible elation. Ejection fraction was 56%. There is no evidence for ischemia. She had laboratory which showed an LDL particle number at 8:30 with a target LDL of 38 HDL 42 triglycerides 170. Her insulin resistance was elevated at 60. Hemoglobin A1c was 5.7. Mean plasma glucose yesterday was 117. She denies recent chest pain. She denies significant shortness of breath. She presents for followup evaluation.  Past Medical History  Diagnosis Date  . CAD (coronary artery disease)   . HTN (hypertension)   . Dyslipidemia   . Tobacco abuse   . GERD (gastroesophageal reflux disease)   . Cancer   . Non-small cell lung cancer 08/2010    Stage IA, status post left upper lobectomy July 2012  . Myocardial infarct     Past Surgical History  Procedure Laterality Date  . Left vats  09/17/2010  . Coronary artery bypass graft  07/11/2010    LIMA to LAD; SVG to 2nd branch OM; SVG to posterior descending artery  . Tee without cardioversion  07/06/2010    during emergent CABG surgery; 2-3+ mitral regurgitation  . Cardiovascular stress test  09/11/2010    R/S MV - normal perfusion in all regions, EF 46%, no scintigraphic evidence of inducible myocardial ischemia; global LV systolic function mildly reduced; no significant wall motion abnormalities noted; Exercise capacity 7 METS; EKG negative for ischemia; low risk study, no signifcant change from previous study 08/2003  .  Cardiac catheterization  07/06/2010    see CABG report - pt sent to OR    No Known Allergies  Current Outpatient Prescriptions  Medication Sig Dispense Refill  . albuterol-ipratropium (COMBIVENT) 18-103 MCG/ACT inhaler Inhale 2 puffs into the lungs every 6 (six) hours as needed.  1 Inhaler  2  . aspirin 325 MG tablet Take 325 mg  by mouth daily.        . furosemide (LASIX) 20 MG tablet Take 1 tablet (20 mg total) by mouth as needed.  30 tablet  5  . metoprolol succinate (TOPROL-XL) 100 MG 24 hr tablet Take 1 tablet (100 mg total) by mouth daily. Take with or immediately following a meal.  30 tablet  6  . rosuvastatin (CRESTOR) 40 MG tablet Take 1 tablet (40 mg total) by mouth daily.  11 tablet  6   No current facility-administered medications for this visit.    Also she is married has 3 children 5 grandchildren. She is an ex-smoker. She does not routinely exercise. There is no alcohol use.  ROS is negative for recent fever chills or night sweats. She has not been successful in weight loss. Note a rare to occasional palpitation. There is intermittent mild shortness of breath with activity. She denies chest pressure. She denies bleeding. He denies wheezing. At times there is some mild leg swelling. She denies paresthesias. Other system review is negative.  PE BP 140/80  Pulse 62  Ht 5\' 4"  (1.626 m)  Wt 203 lb (92.08 kg)  BMI 34.83 kg/m2  General: Alert, oriented, no distress.  HEENT: Normocephalic, atraumatic. Pupils round and reactive; sclera anicteric;  Nose without nasal septal hypertrophy Mouth/Parynx benign; Mallinpatti scale 2/3 Neck: No JVD, no carotid briuts Lungs: clear to ausculatation and percussion; no wheezing or rales Heart: RRR, s1 s2 normal 1/6 systolic murmur. Abdomen: Mildly protuberant with mild central adiposity. soft, nontender; no hepatosplenomehaly, BS+; abdominal aorta nontender and not dilated by palpation. Pulses 2+ Extremities: no clubbinbg cyanosis or edema, Homan's sign negative  Neurologic: grossly nonfocal  ECG: NSR @ 62 bpm with T-wave abnormality anteriorly V1 through V5 unchanged.  LABS:  BMET    Component Value Date/Time   NA 141 10/15/2012 1343   K 4.9 10/15/2012 1343   CL 107 10/15/2012 1343   CO2 26 10/15/2012 1343   GLUCOSE 82 10/15/2012 1343   BUN 13 10/15/2012 1343    CREATININE 0.90 10/15/2012 1343   CREATININE 0.62 09/22/2010 0510   CALCIUM 9.6 10/15/2012 1343   GFRNONAA >60 09/22/2010 0510   GFRAA >60 09/22/2010 0510     Hepatic Function Panel     Component Value Date/Time   PROT 6.4 07/22/2012 0840   ALBUMIN 4.1 07/22/2012 0840   AST 22 07/22/2012 0840   ALT 31 07/22/2012 0840   ALKPHOS 76 07/22/2012 0840   BILITOT 0.3 07/22/2012 0840     CBC    Component Value Date/Time   WBC 8.8 07/22/2012 0840   RBC 4.76 07/22/2012 0840   HGB 13.7 07/22/2012 0840   HCT 40.8 07/22/2012 0840   PLT 221 07/22/2012 0840   MCV 85.7 07/22/2012 0840   MCH 28.8 07/22/2012 0840   MCHC 33.6 07/22/2012 0840   RDW 14.0 07/22/2012 0840   LYMPHSABS 2.0 07/06/2010 0302   MONOABS 1.7* 07/06/2010 0302   EOSABS 0.0 07/06/2010 0302   BASOSABS 0.0 07/06/2010 0302     BNP    Component Value Date/Time   PROBNP 10535.0* 07/10/2010 0400  Lipid Panel  No results found for this basename: chol,  trig,  hdl,  cholhdl,  vldl,  ldlcalc     RADIOLOGY: No results found.    ASSESSMENT AND PLAN: [name] is now 18 years following her initial anterior wall myocardial infarction with VF arrest for which she underwent PTCA emergently of her LAD. She is 2 years following emergency CABG surgery for life-threatening anatomy with 99% left main disease and total occlusion of her RCA at the ostium with left to right collaterals. A recent echo Doppler study reveals an ejection fraction at 50-55% with small area of subtle LAD scar. Nuclear perfusion study is low risk and does not reveal ischemia with a ejection fraction of 56%. He reviewed her laboratory in detail. Her blood pressure is well controlled. Discussed importance of weight loss and increased exercise. She is followed by Dr. Robby Sermon and also with reference to her remote lung CA. I will see her in 6 months for followup cardiology reevaluation.   Lennette Bihari, MD, [facility]  11/14/2012 2:20 PM

## 2012-12-24 ENCOUNTER — Other Ambulatory Visit: Payer: Self-pay

## 2013-02-23 ENCOUNTER — Telehealth: Payer: Self-pay | Admitting: *Deleted

## 2013-02-23 NOTE — Telephone Encounter (Signed)
Pt called to schedule her Feb appointment with Dr. Claiborne Billings scheduled for 2/27 she wanted to know if we can mail her a lab slip to get her blood work done.

## 2013-02-23 NOTE — Telephone Encounter (Signed)
Labs completed in August 2014.  Result note does not indicate to repeat any labs at next visit.  Will defer to [place], [place] w/ Dr. Claiborne Billings to review and order if appropriate.

## 2013-02-24 ENCOUNTER — Other Ambulatory Visit: Payer: Self-pay | Admitting: *Deleted

## 2013-02-24 DIAGNOSIS — E782 Mixed hyperlipidemia: Secondary | ICD-10-CM

## 2013-02-24 DIAGNOSIS — I10 Essential (primary) hypertension: Secondary | ICD-10-CM

## 2013-02-24 NOTE — Telephone Encounter (Signed)
Informed patient that I will be mailing lab orders to her as she has requested.

## 2013-03-18 LAB — COMPREHENSIVE METABOLIC PANEL
ALK PHOS: 80 U/L (ref 39–117)
ALT: 21 U/L (ref 0–35)
AST: 16 U/L (ref 0–37)
Albumin: 4 g/dL (ref 3.5–5.2)
BILIRUBIN TOTAL: 0.4 mg/dL (ref 0.2–1.2)
BUN: 13 mg/dL (ref 6–23)
CO2: 27 meq/L (ref 19–32)
Calcium: 9.5 mg/dL (ref 8.4–10.5)
Chloride: 106 mEq/L (ref 96–112)
Creat: 0.76 mg/dL (ref 0.50–1.10)
Glucose, Bld: 97 mg/dL (ref 70–99)
Potassium: 5.2 mEq/L (ref 3.5–5.3)
SODIUM: 139 meq/L (ref 135–145)
TOTAL PROTEIN: 6.8 g/dL (ref 6.0–8.3)

## 2013-03-18 LAB — LIPID PANEL
Cholesterol: 127 mg/dL (ref 0–200)
HDL: 38 mg/dL — ABNORMAL LOW (ref >39)
LDL CALC: 50 mg/dL (ref 0–99)
Total CHOL/HDL Ratio: 3.3 Ratio
Triglycerides: 197 mg/dL — ABNORMAL HIGH (ref <150)
VLDL: 39 mg/dL (ref 0–40)

## 2013-03-18 LAB — CBC
HCT: 43.9 % (ref 36.0–46.0)
Hemoglobin: 14.5 g/dL (ref 12.0–15.0)
MCH: 28.3 pg (ref 26.0–34.0)
MCHC: 33 g/dL (ref 30.0–36.0)
MCV: 85.6 fL (ref 78.0–100.0)
PLATELETS: 220 10*3/uL (ref 150–400)
RBC: 5.13 MIL/uL — AB (ref 3.87–5.11)
RDW: 14.5 % (ref 11.5–15.5)
WBC: 9.3 10*3/uL (ref 4.0–10.5)

## 2013-03-18 LAB — TSH: TSH: 8.087 u[IU]/mL — AB (ref 0.350–4.500)

## 2013-03-23 ENCOUNTER — Other Ambulatory Visit: Payer: Self-pay | Admitting: *Deleted

## 2013-03-23 DIAGNOSIS — C3490 Malignant neoplasm of unspecified part of unspecified bronchus or lung: Secondary | ICD-10-CM

## 2013-04-01 ENCOUNTER — Other Ambulatory Visit: Payer: Self-pay | Admitting: *Deleted

## 2013-04-01 ENCOUNTER — Other Ambulatory Visit: Payer: Self-pay

## 2013-04-01 DIAGNOSIS — R899 Unspecified abnormal finding in specimens from other organs, systems and tissues: Secondary | ICD-10-CM

## 2013-04-01 MED ORDER — ROSUVASTATIN CALCIUM 40 MG PO TABS: 40.0000 mg | ORAL_TABLET | Freq: Every day | ORAL | Status: DC

## 2013-04-01 NOTE — Telephone Encounter (Signed)
Rx was sent to pharmacy electronically. 

## 2013-04-12 LAB — T3, FREE: T3 FREE: 3 pg/mL (ref 2.3–4.2)

## 2013-04-12 LAB — T4, FREE: Free T4: 1.04 ng/dL (ref 0.80–1.80)

## 2013-04-12 LAB — TSH: TSH: 4.659 u[IU]/mL — AB (ref 0.350–4.500)

## 2013-04-13 ENCOUNTER — Ambulatory Visit (INDEPENDENT_AMBULATORY_CARE_PROVIDER_SITE_OTHER): Payer: Medicare Other | Admitting: Thoracic Surgery (Cardiothoracic Vascular Surgery)

## 2013-04-13 ENCOUNTER — Ambulatory Visit
Admission: RE | Admit: 2013-04-13 | Discharge: 2013-04-13 | Disposition: A | Discharge status: Home / Self Care | Payer: Medicare Other | Source: Ambulatory Visit | Attending: Thoracic Surgery (Cardiothoracic Vascular Surgery) | Admitting: Thoracic Surgery (Cardiothoracic Vascular Surgery)

## 2013-04-13 ENCOUNTER — Encounter: Payer: Self-pay | Admitting: Thoracic Surgery (Cardiothoracic Vascular Surgery)

## 2013-04-13 VITALS — BP 150/82 | HR 80 | Resp 20 | Ht 64.0 in | Wt 199.0 lb

## 2013-04-13 DIAGNOSIS — J209 Acute bronchitis, unspecified: Secondary | ICD-10-CM

## 2013-04-13 DIAGNOSIS — Z85118 Personal history of other malignant neoplasm of bronchus and lung: Secondary | ICD-10-CM

## 2013-04-13 DIAGNOSIS — Z902 Acquired absence of lung [part of]: Secondary | ICD-10-CM

## 2013-04-13 DIAGNOSIS — Z951 Presence of aortocoronary bypass graft: Secondary | ICD-10-CM

## 2013-04-13 DIAGNOSIS — C3490 Malignant neoplasm of unspecified part of unspecified bronchus or lung: Secondary | ICD-10-CM

## 2013-04-13 DIAGNOSIS — Z9889 Other specified postprocedural states: Secondary | ICD-10-CM

## 2013-04-13 MED ORDER — AZITHROMYCIN 250 MG PO TABS: ORAL_TABLET | ORAL | Status: DC

## 2013-04-13 NOTE — Progress Notes (Signed)
HPI:  [name] returns today for a scheduled followup visit.  She is a 68 year old woman who back in June of 2012 presented with an acute MI. She had emergency catheterization and then emergency bypass grafting. During the surgery a left upper lobe mass was palpated. She returned to about 4 weeks later and had a left upper lobectomy for what turned out to be a stage IA non-small cell carcinoma.  She was last seen in the office in August for her 2 year followup and was free of recurrent disease at that time.  Since then she's been feeling well until about a week and a half ago when she developed cough and thought she had a cold. This cough has been persistent. She does clear some yellow mucus, but has a hard time getting it out. She has had some fevers subjectively.  She says that her weight has been stable. She was not had any trouble with coughing or wheezing prior to this acute illness. She's not had any recurrent anginal pain.  Past Medical History  Diagnosis Date  . CAD (coronary artery disease)   . HTN (hypertension)   . Dyslipidemia   . Tobacco abuse   . GERD (gastroesophageal reflux disease)   . Cancer   . Non-small cell lung cancer 08/2010    Stage IA, status post left upper lobectomy July 2012  . Myocardial infarct       Current Outpatient Prescriptions  Medication Sig Dispense Refill  . albuterol-ipratropium (COMBIVENT) 18-103 MCG/ACT inhaler Inhale 2 puffs into the lungs every 6 (six) hours as needed.  1 Inhaler  2  . aspirin 325 MG tablet Take 325 mg by mouth daily.        . furosemide (LASIX) 20 MG tablet Take 1 tablet (20 mg total) by mouth as needed.  30 tablet  5  . metoprolol succinate (TOPROL-XL) 100 MG 24 hr tablet Take 1 tablet (100 mg total) by mouth daily. Take with or immediately following a meal.  30 tablet  6  . Omega-3 Fatty Acids (FISH OIL) 1000 MG CAPS Take 2,000 mg by mouth daily.      . rosuvastatin (CRESTOR) 40 MG tablet Take 1 tablet (40 mg  total) by mouth daily.  30 tablet  6  . azithromycin (ZITHROMAX Z-PAK) 250 MG tablet Take 2 tablets daily for 3 days  6 each  0   No current facility-administered medications for this visit.    Physical Exam BP 150/82  Pulse 80  Resp 20  Ht 5\' 4"  (1.626 m)  Wt 199 lb (90.266 kg)  BMI 34.14 kg/m2  SpO22 48% 68 year old woman in no acute distress, but with frequent cough No cervical or suprapubic or adenopathy Cardiac regular rate and rhythm normal S1 and S2 Lungs with rhonchi bilaterally  Diagnostic Tests: CT chest 04/13/2013 CT CHEST WITHOUT CONTRAST  TECHNIQUE:  Multidetector CT imaging of the chest was performed following the  standard protocol without IV contrast.  COMPARISON: 10/13/2012 and earlier.  FINDINGS:  Stable lung volumes. Stable major airways status post left upper  lobectomy. Mild postoperative architectural distortion in the left  upper lung is stable with no mass like or increased opacity.  Prominence of the left pulmonary artery and veins at the left hilum  appears stable across this series of non contrasted exams.  No pulmonary nodule. Chronic increased reticular opacity at the  anterior right lung is stable.  No pericardial or pleural effusion. Sequelae of CABG. No mediastinal  lymphadenopathy.  Negative non contrast thoracic inlet. No axillary  lymphadenopathy.  Stable and negative visualized non contrast liver, gallbladder,  spleen, pancreas, adrenal glands, kidneys, and bowel.  No acute or suspicious osseous lesion.  IMPRESSION:  Stable postoperative appearance of the chest. No evidence of  recurrent or metastatic disease.  Electronically Signed  By: Lars Pinks M.D.  On: 04/13/2013 12:43  Impression: 68 year old woman who is now 2-1/2 years post left upper lobectomy for a stage IA non-small cell carcinoma. She has no evidence of recurrent disease.  She does have signs and symptoms consistent with acute bronchitis. This is been persistent for 10  days without much improvement. He would be prudent to treat her with antibiotics this point. I gave her a prescription for azithromycin 500 mg by mouth daily for 3 days.  Plan: I'll plan to see her back in 6 months for her 3 year followup visit

## 2013-04-16 ENCOUNTER — Encounter: Payer: Self-pay | Admitting: Cardiovascular Disease

## 2013-04-16 ENCOUNTER — Ambulatory Visit (INDEPENDENT_AMBULATORY_CARE_PROVIDER_SITE_OTHER): Payer: Medicare Other | Admitting: Cardiovascular Disease

## 2013-04-16 VITALS — BP 130/64 | HR 65 | Ht 64.0 in | Wt 203.4 lb

## 2013-04-16 DIAGNOSIS — R002 Palpitations: Secondary | ICD-10-CM

## 2013-04-16 DIAGNOSIS — I2510 Atherosclerotic heart disease of native coronary artery without angina pectoris: Secondary | ICD-10-CM

## 2013-04-16 DIAGNOSIS — E785 Hyperlipidemia, unspecified: Secondary | ICD-10-CM

## 2013-04-16 DIAGNOSIS — I10 Essential (primary) hypertension: Secondary | ICD-10-CM

## 2013-04-16 DIAGNOSIS — K219 Gastro-esophageal reflux disease without esophagitis: Secondary | ICD-10-CM

## 2013-04-16 NOTE — Patient Instructions (Signed)
Your physician recommends that you schedule a follow-up appointment in: 6 months  

## 2013-04-17 ENCOUNTER — Encounter: Payer: Self-pay | Admitting: Cardiovascular Disease

## 2013-04-17 NOTE — Progress Notes (Signed)
Patient ID: [name], female   DOB: [birth year], 69 y.o.   MRN: [id number]     HPI: [name], is a 68 y.o. female who presents to the office today for a 6 month follow up cardiology evaluation.  [name] suffered initial anterior wall myocardial infarction with ventricular fibrillation arrest requiring resuscitation and PTCA of her LAD in 1997. On 07/05/2010 she presented with increasing shortness of breath and was found to have critical life threatening anatomy with 99% left main equivalent disease.   Later that afternoon she underwent emergent  CABG revascularization surgery by Dr. Roxan Hockey with a LIMA placed to the LAD, a vein graft to the second branch of the obtuse marginal vessel, and vein graft to the PDA. Of note, subsequent chest x-rays disclosed a left upper lung nodule and ultimately this proved to be adenocarcinoma for which he ultimately underwent upper lobe resection in July 2012. Additional problems include hypertension, hyperlipidemia, obesity and probable metabolic syndrome. A nuclear perfusion study in July 2012 showed a post stress ejection fraction of 46% but otherwise fairly normal perfusion. EF at cath was 35% prior to surgery.   In December 2013 an NMR lipoprofile revealed that despite an LDL of 50 she had increased LDL particle #1312, and increased small LDL particle number at 761. Her insulin resistance was significantly elevated at 70. HDL particle number was low at 26.8 and a triglyceride of 152.evaluation.  When I last saw her several months ago I recommended further titration of her beta blocker, laboratory, as well as reassessment of LV function and myocardial perfusion. An echo Doppler study was done 08/04/2012 which showed an ejection fraction at 50 - 55%. She had mild LVH. There was grade 1 diastolic dysfunction. There is evidence for distal anterior, anteroseptal and apical LAD scar. There is mild tricuspid regurgitation and mild mitral  regurgitation. PA pressure estimated 32 mm.  A LexiScan study was low risk. Ejection fraction was 56%. There is no evidence for ischemia. She had laboratory which showed an LDL particle number at 830 with a target LDL of 38 HDL 42 triglycerides 170. Her insulin resistance was elevated at 60. Hemoglobin A1c was 5.7.  Since I last saw her 6 months ago, she denies any recurrent chest pain or shortness of breath. Followup CT of her lungs was done this week and sees Dr. Roxan Hockey in followup of her lung cancer. She did have followup blood work one month ago which showed total cholesterol 127 her triglyceride had increased to 197 HDL was 38 and LDL cholesterol was 50. TSH was mildly increased at 8.08. 6 months ago her TSH was 4.9. She had normal free T3 of 3.0 and a free T4-1 0.04 and a repeat TSH 4 days ago was 4.659. She presents today for evaluation.   Past Medical History  Diagnosis Date  . CAD (coronary artery disease)   . HTN (hypertension)   . Dyslipidemia   . Tobacco abuse   . GERD (gastroesophageal reflux disease)   . Cancer   . Non-small cell lung cancer 08/2010    Stage IA, status post left upper lobectomy July 2012  . Myocardial infarct     Past Surgical History  Procedure Laterality Date  . Left vats  09/17/2010  . Coronary artery bypass graft  07/11/2010    LIMA to LAD; SVG to 2nd branch OM; SVG to posterior descending artery  . Tee without cardioversion  07/06/2010    during emergent CABG surgery; 2-3+ mitral regurgitation  .  Cardiovascular stress test  09/11/2010    R/S MV - normal perfusion in all regions, EF 46%, no scintigraphic evidence of inducible myocardial ischemia; global LV systolic function mildly reduced; no significant wall motion abnormalities noted; Exercise capacity 7 METS; EKG negative for ischemia; low risk study, no signifcant change from previous study 08/2003  . Cardiac catheterization  07/06/2010    see CABG report - pt sent to OR    No Known  Allergies  Current Outpatient Prescriptions  Medication Sig Dispense Refill  . albuterol-ipratropium (COMBIVENT) 18-103 MCG/ACT inhaler Inhale 2 puffs into the lungs every 6 (six) hours as needed.  1 Inhaler  2  . aspirin 325 MG tablet Take 325 mg by mouth daily.        Marland Kitchen azithromycin (ZITHROMAX Z-PAK) 250 MG tablet Take 2 tablets daily for 3 days  6 each  0  . furosemide (LASIX) 20 MG tablet Take 1 tablet (20 mg total) by mouth as needed.  30 tablet  5  . metoprolol succinate (TOPROL-XL) 100 MG 24 hr tablet Take 1 tablet (100 mg total) by mouth daily. Take with or immediately following a meal.  30 tablet  6  . Omega-3 Fatty Acids (FISH OIL) 1000 MG CAPS Take 2,000 mg by mouth daily.      . rosuvastatin (CRESTOR) 40 MG tablet Take 1 tablet (40 mg total) by mouth daily.  30 tablet  6   No current facility-administered medications for this visit.    Also she is married has 3 children 5 grandchildren. She is an ex-smoker. She does not routinely exercise. There is no alcohol use.  ROS is negative for recent fever chills or night sweats. She has not been successful in weight loss. She denies change in vision or hearing. She denies adenopathy. There are no headaches. Note a rare to occasional palpitation. There is intermittent mild shortness of breath with activity. She denies chest pressure. She denies bleeding. He denies wheezing. She denies nausea vomiting or diarrhea. She denies blood in stool or urine. At times there is some mild leg swelling. She denies paresthesias. She denies arthritic symptoms. She does have insulin resistance. Other comprehensive 14 system review is negative.  PE BP 130/64  Pulse 65  Ht 5\' 4"  (1.626 m)  Wt 203 lb 6.4 oz (92.262 kg)  BMI 34.90 kg/m2  General: Alert, oriented, no distress.  HEENT: Normocephalic, atraumatic. Pupils round and reactive; sclera anicteric; no xanthelasmas. Nose without nasal septal hypertrophy Mouth/Parynx benign; Mallinpatti scale  2/3 Neck: No JVD, no carotid bruits normal carotid Lungs: clear to ausculatation and percussion; no wheezing or rales Chest wall: No tenderness to palpation Heart: RRR, s1 s2 normal 1/6 systolic murmur. No diastolic murmur, rubs thrills or heaves Abdomen: Mildly protuberant with mild central adiposity. soft, nontender; no hepatosplenomehaly, BS+; abdominal aorta nontender and not dilated by palpation. Back: No CVA tenderness Pulses 2+ Extremities: no clubbinbg cyanosis or edema, Homan's sign negative  Neurologic: grossly nonfocal Psychological: Normal affect and  ECG (independently read by me): Normal sinus rhythm at 65 beats per minute. T wave changes V1 through V6, nonspecific, unchanged. QTc interval 455 ms  Prior ECG of October 15 2012: NSR @ 62 bpm with T-wave abnormality anteriorly V1 through V5 unchanged.  LABS:  BMET    Component Value Date/Time   NA 139 03/18/2013 0850   K 5.2 03/18/2013 0850   CL 106 03/18/2013 0850   CO2 27 03/18/2013 0850   GLUCOSE 97 03/18/2013 0850  BUN 13 03/18/2013 0850   CREATININE 0.76 03/18/2013 0850   CREATININE 0.62 09/22/2010 0510   CALCIUM 9.5 03/18/2013 0850   GFRNONAA >60 09/22/2010 0510   GFRAA >60 09/22/2010 0510     Hepatic Function Panel     Component Value Date/Time   PROT 6.8 03/18/2013 0850   ALBUMIN 4.0 03/18/2013 0850   AST 16 03/18/2013 0850   ALT 21 03/18/2013 0850   ALKPHOS 80 03/18/2013 0850   BILITOT 0.4 03/18/2013 0850     CBC    Component Value Date/Time   WBC 9.3 03/18/2013 0853   RBC 5.13* 03/18/2013 0853   HGB 14.5 03/18/2013 0853   HCT 43.9 03/18/2013 0853   PLT 220 03/18/2013 0853   MCV 85.6 03/18/2013 0853   MCH 28.3 03/18/2013 0853   MCHC 33.0 03/18/2013 0853   RDW 14.5 03/18/2013 0853   LYMPHSABS 2.0 07/06/2010 0302   MONOABS 1.7* 07/06/2010 0302   EOSABS 0.0 07/06/2010 0302   BASOSABS 0.0 07/06/2010 0302     BNP    Component Value Date/Time   PROBNP 10535.0* 07/10/2010 0400    Lipid Panel     Component Value  Date/Time   CHOL 127 03/18/2013 0847     RADIOLOGY: No results found.    ASSESSMENT AND PLAN: Ms. Guizar is now 47  years following her initial anterior wall myocardial infarction with VF arrest for which she underwent PTCA emergently of her LAD in 1997. She is 2 years following emergency CABG surgery for life-threatening anatomy with 99% left main disease and total occlusion of her RCA at the ostium with left to right collaterals. Her ejection fraction did improve and is now 50-55% with small area of subtle LAD scar. Nuclear perfusion study is low risk and does not reveal ischemia with a ejection fraction of 56%. I reviewed her laboratory in detail. Her blood pressure is well controlled and she is tolerating Toprol-XL in addition to as needed furosemide. She denies any recent palpitations. She is on Crestor 40 mg daily and fish oil 2000 mg daily for hyperlipidemia. She has an increased triglycerides level and I discussed with her the importance to further decrease her sugar intake. She admits that she has been drinking many more might help induce recently than she had in the past. Her lung cancer is being followed by Dr. Roxan Hockey and reportedly the chest CT was negative. I discussed importance of weight loss and increased exercise. I will see her in 6 months for cardiology reevaluation. Troy Sine, MD, [facility]  04/17/2013 2:29 PM

## 2013-05-07 ENCOUNTER — Encounter: Payer: Self-pay | Admitting: Cardiovascular Disease

## 2013-05-10 ENCOUNTER — Encounter: Payer: Self-pay | Admitting: Cardiovascular Disease

## 2013-05-10 MED ORDER — METOPROLOL SUCCINATE ER 100 MG PO TB24: 100.0000 mg | ORAL_TABLET | Freq: Every day | ORAL | Status: DC

## 2013-05-11 MED ORDER — METOPROLOL SUCCINATE ER 100 MG PO TB24: 100.0000 mg | ORAL_TABLET | Freq: Every day | ORAL | Status: DC

## 2013-05-12 ENCOUNTER — Other Ambulatory Visit: Payer: Self-pay

## 2013-05-12 MED ORDER — METOPROLOL SUCCINATE ER 100 MG PO TB24: 100.0000 mg | ORAL_TABLET | Freq: Every day | ORAL | Status: DC

## 2013-05-12 NOTE — Telephone Encounter (Signed)
Rx was sent to pharmacy electronically. 

## 2013-09-06 ENCOUNTER — Other Ambulatory Visit: Payer: Self-pay | Admitting: *Deleted

## 2013-09-06 DIAGNOSIS — C3410 Malignant neoplasm of upper lobe, unspecified bronchus or lung: Secondary | ICD-10-CM

## 2013-10-05 ENCOUNTER — Ambulatory Visit (INDEPENDENT_AMBULATORY_CARE_PROVIDER_SITE_OTHER): Payer: Medicare Other | Admitting: Thoracic Surgery (Cardiothoracic Vascular Surgery)

## 2013-10-05 ENCOUNTER — Ambulatory Visit
Admission: RE | Admit: 2013-10-05 | Discharge: 2013-10-05 | Disposition: A | Discharge status: Home / Self Care | Payer: Medicare Other | Source: Ambulatory Visit | Attending: Thoracic Surgery (Cardiothoracic Vascular Surgery) | Admitting: Thoracic Surgery (Cardiothoracic Vascular Surgery)

## 2013-10-05 ENCOUNTER — Encounter: Payer: Self-pay | Admitting: Thoracic Surgery (Cardiothoracic Vascular Surgery)

## 2013-10-05 VITALS — BP 137/80 | HR 70 | Ht 64.0 in | Wt 203.0 lb

## 2013-10-05 DIAGNOSIS — I2510 Atherosclerotic heart disease of native coronary artery without angina pectoris: Secondary | ICD-10-CM

## 2013-10-05 DIAGNOSIS — Z85118 Personal history of other malignant neoplasm of bronchus and lung: Secondary | ICD-10-CM

## 2013-10-05 DIAGNOSIS — C3410 Malignant neoplasm of upper lobe, unspecified bronchus or lung: Secondary | ICD-10-CM

## 2013-10-05 NOTE — Progress Notes (Signed)
HPI:  [name] returns for a scheduled followup visit.  She is a 68 year old woman who back in June of 2012 presented with an acute MI. She had emergency catheterization and then emergency bypass grafting. During the surgery a left upper lobe mass was palpated. She returned to about 4 weeks later and had a left upper lobectomy for what turned out to be a stage IA non-small cell carcinoma. She did not require adjuvant chemotherapy.  She was last in the office in February which time she was doing well with no evidence recurrent disease.  She says she's been under a great deal of stress lately. She had to put both of her elderly parents into assisted living. Because of that stress she says that she has been smoking from time to time. She denies any anginal-type pain. Her exercise tolerance is good. He does have an occasional cough. Her weight is stable. No unusual headaches or visual changes  Past Medical History  Diagnosis Date  . CAD (coronary artery disease)   . HTN (hypertension)   . Dyslipidemia   . Tobacco abuse   . GERD (gastroesophageal reflux disease)   . Cancer   . Non-small cell lung cancer 08/2010    Stage IA, status post left upper lobectomy July 2012  . Myocardial infarct        Current Outpatient Prescriptions  Medication Sig Dispense Refill  . albuterol-ipratropium (COMBIVENT) 18-103 MCG/ACT inhaler Inhale 2 puffs into the lungs every 6 (six) hours as needed.  1 Inhaler  2  . aspirin 325 MG tablet Take 325 mg by mouth daily.        Marland Kitchen co-enzyme Q-10 30 MG capsule Take 100 mg by mouth daily.      . furosemide (LASIX) 20 MG tablet Take 1 tablet (20 mg total) by mouth as needed.  30 tablet  5  . metoprolol succinate (TOPROL-XL) 100 MG 24 hr tablet Take 1 tablet (100 mg total) by mouth daily. Take with or immediately following a meal.  30 tablet  11  . Omega-3 Fatty Acids (FISH OIL) 1000 MG CAPS Take 2,000 mg by mouth daily.      . rosuvastatin (CRESTOR) 40 MG tablet  Take 1 tablet (40 mg total) by mouth daily.  30 tablet  6   No current facility-administered medications for this visit.    Physical Exam BP 137/80  Pulse 70  Ht 5\' 4"  (1.626 m)  Wt 203 lb (92.08 kg)  BMI 34.83 kg/m2  SpO39 51% 68 year old woman in no acute distress Alert and oriented x3 with no focal deficits Lungs slightly diminished at left base otherwise clear Cardiac regular rate and rhythm normal S1-S2 No cervical or supraclavicular adenopathy  Diagnostic Tests: CT CHEST WITHOUT CONTRAST  TECHNIQUE:  Multidetector CT imaging of the chest was performed following the  standard protocol without IV contrast.  COMPARISON: Prior CTs ranging from 04/09/2011 through 04/13/2013.  FINDINGS:  Mediastinum: There are no enlarged mediastinal, hilar or axillary  lymph nodes. Distortion of the left hilum appears stable. The  thyroid gland, trachea and esophagus appear stable. The heart size  is normal. There are stable atherosclerotic changes status post  median sternotomy.  Lungs/Pleura: There is no pleural or pericardial effusion.There is  stable volume loss in the left hemithorax status post left upper  lobe resection. There is stable mild subpleural reticulation and  architectural distortion in both lungs. There are no suspicious  pulmonary nodules. Mild nodular thickening of the minor fissure on  image 28 is stable  Upper abdomen: There is a stable cystic lesion in the left hepatic  lobe on image 46. No adrenal mass is demonstrated.  Musculoskeletal/Chest wall: No chest wall lesions or worrisome  osseous findings.  IMPRESSION:  Stable postoperative chest CT. No evidence of local recurrence or  metastatic disease.  Electronically Signed  By: Camie Patience M.D.  On: 10/05/2013 12:49   Impression: 68 year old woman who is now 3 years post coronary bypass grafting and post left upper lobectomy for a stage IA non-small cell carcinoma. She is doing well on both  accounts.  Unfortunately she has started smoking again. She is a little evasive about exactly how much she has been smoking. I stressed the importance of smoking cessation with her.  She is now 3 years now with no evidence of recurrent cancer. At this point the recommendation is to follow with annual CT scans.  Plan: Return in one year with CT of chest

## 2013-10-11 ENCOUNTER — Telehealth: Payer: Self-pay | Admitting: Cardiovascular Disease

## 2013-10-11 DIAGNOSIS — R5383 Other fatigue: Secondary | ICD-10-CM

## 2013-10-11 DIAGNOSIS — Z79899 Other long term (current) drug therapy: Secondary | ICD-10-CM

## 2013-10-11 DIAGNOSIS — E785 Hyperlipidemia, unspecified: Secondary | ICD-10-CM

## 2013-10-11 DIAGNOSIS — R5381 Other malaise: Secondary | ICD-10-CM

## 2013-10-11 NOTE — Telephone Encounter (Signed)
Labs ordered according to last set of labs ordered by Dr. Claiborne Billings. Patient notified.

## 2013-10-11 NOTE — Telephone Encounter (Signed)
Pt is scheduled to see Dr Claiborne Billings next week. She would like to get lab work this week,please send her lab order downstairs. Please let her know when you send this.

## 2013-10-12 LAB — COMPREHENSIVE METABOLIC PANEL
ALT: 29 U/L (ref 0–35)
AST: 24 U/L (ref 0–37)
Albumin: 4.2 g/dL (ref 3.5–5.2)
Alkaline Phosphatase: 74 U/L (ref 39–117)
BUN: 11 mg/dL (ref 6–23)
CALCIUM: 9.5 mg/dL (ref 8.4–10.5)
CHLORIDE: 105 meq/L (ref 96–112)
CO2: 28 meq/L (ref 19–32)
Creat: 0.79 mg/dL (ref 0.50–1.10)
Glucose, Bld: 99 mg/dL (ref 70–99)
Potassium: 5.1 mEq/L (ref 3.5–5.3)
SODIUM: 139 meq/L (ref 135–145)
TOTAL PROTEIN: 6.5 g/dL (ref 6.0–8.3)
Total Bilirubin: 0.3 mg/dL (ref 0.2–1.2)

## 2013-10-12 LAB — CBC
HCT: 42.2 % (ref 36.0–46.0)
Hemoglobin: 14.2 g/dL (ref 12.0–15.0)
MCH: 28.6 pg (ref 26.0–34.0)
MCHC: 33.6 g/dL (ref 30.0–36.0)
MCV: 85.1 fL (ref 78.0–100.0)
PLATELETS: 204 10*3/uL (ref 150–400)
RBC: 4.96 MIL/uL (ref 3.87–5.11)
RDW: 14.7 % (ref 11.5–15.5)
WBC: 7.8 10*3/uL (ref 4.0–10.5)

## 2013-10-12 LAB — TSH: TSH: 6.772 u[IU]/mL — ABNORMAL HIGH (ref 0.350–4.500)

## 2013-10-12 LAB — LIPID PANEL
Cholesterol: 105 mg/dL (ref 0–200)
HDL: 37 mg/dL — ABNORMAL LOW (ref >39)
LDL CALC: 38 mg/dL (ref 0–99)
Total CHOL/HDL Ratio: 2.8 Ratio
Triglycerides: 149 mg/dL (ref <150)
VLDL: 30 mg/dL (ref 0–40)

## 2013-10-20 ENCOUNTER — Ambulatory Visit (INDEPENDENT_AMBULATORY_CARE_PROVIDER_SITE_OTHER): Payer: Medicare Other | Admitting: Cardiovascular Disease

## 2013-10-20 ENCOUNTER — Encounter: Payer: Self-pay | Admitting: Cardiovascular Disease

## 2013-10-20 VITALS — BP 120/64 | HR 70 | Ht 64.0 in | Wt 193.5 lb

## 2013-10-20 DIAGNOSIS — C3410 Malignant neoplasm of upper lobe, unspecified bronchus or lung: Secondary | ICD-10-CM

## 2013-10-20 DIAGNOSIS — E785 Hyperlipidemia, unspecified: Secondary | ICD-10-CM

## 2013-10-20 DIAGNOSIS — I10 Essential (primary) hypertension: Secondary | ICD-10-CM

## 2013-10-20 DIAGNOSIS — I2510 Atherosclerotic heart disease of native coronary artery without angina pectoris: Secondary | ICD-10-CM

## 2013-10-20 DIAGNOSIS — C3411 Malignant neoplasm of upper lobe, right bronchus or lung: Secondary | ICD-10-CM

## 2013-10-20 DIAGNOSIS — R002 Palpitations: Secondary | ICD-10-CM

## 2013-10-20 MED ORDER — METOPROLOL SUCCINATE ER 100 MG PO TB24: 100.0000 mg | ORAL_TABLET | Freq: Every day | ORAL | Status: DC

## 2013-10-20 MED ORDER — ROSUVASTATIN CALCIUM 40 MG PO TABS: 40.0000 mg | ORAL_TABLET | Freq: Every day | ORAL | Status: DC

## 2013-10-20 NOTE — Patient Instructions (Addendum)
Your physician has recommended you make the following change in your medication: decrease the aspirin down to 81 mg daily.  Your physician wants you to follow-up in: 6 months You will receive a reminder letter in the mail two months in advance. If you don't receive a letter, please call our office to schedule the follow-up appointment.

## 2013-10-20 NOTE — Progress Notes (Signed)
Patient ID: [name], female   DOB: May 17, 1945, 68 y.o.   MRN: [id number] [place]    HPI: [name] is a 68 y.o. female who presents to the office today for a 6 month follow up cardiology evaluation.  [name] suffered initial anterior wall myocardial infarction with ventricular fibrillation arrest requiring resuscitation and PTCA of her LAD in 1997. On 07/05/2010 she presented with increasing shortness of breath and was found to have critical life threatening anatomy with 99% left main equivalent disease.   Later that afternoon she underwent emergent CABG revascularization surgery by Dr. Roxan Hockey with a LIMA to the LAD, a vein graft to the second branch of the obtuse marginal vessel, and vein graft to the PDA.  Chest x-rays disclosed a left upper lung nodule and ultimately this proved to be adenocarcinoma for which she ultimately underwent upper lobe resection in July 2012. Additional problems include hypertension, hyperlipidemia, obesity and probable metabolic syndrome. A nuclear perfusion study in July 2012 showed a post stress ejection fraction of 46% but otherwise fairly normal perfusion. EF at cath was 35% prior to surgery.   In December 2013 an NMR lipoprofile revealed that despite an LDL of 50 she had increased LDL particle #1312, and increased small LDL particle number at 761. Her insulin resistance was significantly elevated at 70. HDL particle number was low at 26.8 and a triglyceride of 152.evaluation.  An echo Doppler study on 08/04/2012 showed an ejection fraction at 50 - 55%. She had mild LVH. There was grade 1 diastolic dysfunction with distal anterior, anteroseptal and apical LAD scar. There is mild tricuspid regurgitation and mild mitral regurgitation. PA pressure estimated 32 mm.  A LexiScan study in 07/2012 was low risk. Ejection fraction was 56%. There is no evidence for ischemia. She had laboratory which showed an LDL particle number at 830 with a target LDL of 38  HDL 42 triglycerides 170. Her insulin resistance was elevated at 60. Hemoglobin A1c was 5.7.  Since I last saw her in February 2015, she has remained stable.  Her chest CT was negative for recurrent lung cancer and she now will be seeing Dr. Gorden Harms in on an annual basis.  She has lost 10 pounds.  She denies exercise-induced chest pain or shortness of breath.  She recently had blood work one week ago.  Her cholesterol is excellent at 105 with a triglyceride of 149, VLDL 30, HDL 37, LDL 38.  She normal chemistry profile, as well as CBC.  TSH was borderline increased at 6.7.   She has been gone back-and-forth to [place] to care for her 39 year old mother, who is now in assisted living.  She presents for evaluation.  Past Medical History  Diagnosis Date  . CAD (coronary artery disease)   . HTN (hypertension)   . Dyslipidemia   . Tobacco abuse   . GERD (gastroesophageal reflux disease)   . Cancer   . Non-small cell lung cancer 08/2010    Stage IA, status post left upper lobectomy July 2012  . Myocardial infarct     Past Surgical History  Procedure Laterality Date  . Left vats  09/17/2010  . Coronary artery bypass graft  07/11/2010    LIMA to LAD; SVG to 2nd branch OM; SVG to posterior descending artery  . Tee without cardioversion  07/06/2010    during emergent CABG surgery; 2-3+ mitral regurgitation  . Cardiovascular stress test  09/11/2010    R/S MV - normal perfusion in all regions,  EF 46%, no scintigraphic evidence of inducible myocardial ischemia; global LV systolic function mildly reduced; no significant wall motion abnormalities noted; Exercise capacity 7 METS; EKG negative for ischemia; low risk study, no signifcant change from previous study 08/2003  . Cardiac catheterization  07/06/2010    see CABG report - pt sent to OR    No Known Allergies  Current Outpatient Prescriptions  Medication Sig Dispense Refill  . albuterol-ipratropium (COMBIVENT) 18-103 MCG/ACT inhaler Inhale  2 puffs into the lungs every 6 (six) hours as needed.  1 Inhaler  2  . aspirin 325 MG tablet Take 325 mg by mouth daily.        . Coenzyme Q10 (EQL COQ10) 300 MG CAPS Take by mouth.      . furosemide (LASIX) 20 MG tablet Take 1 tablet (20 mg total) by mouth as needed.  30 tablet  5  . metoprolol succinate (TOPROL-XL) 100 MG 24 hr tablet Take 1 tablet (100 mg total) by mouth daily. Take with or immediately following a meal.  30 tablet  11  . Omega-3 Fatty Acids (FISH OIL) 1000 MG CAPS Take 2,000 mg by mouth daily.      . rosuvastatin (CRESTOR) 40 MG tablet Take 1 tablet (40 mg total) by mouth daily.  30 tablet  6   No current facility-administered medications for this visit.    Also she is married has 3 children 5 grandchildren. She is an ex-smoker. She does not routinely exercise. There is no alcohol use.  ROS General: Negative; No fevers, chills, or night sweats; positive for purposeful weight loss HEENT: Negative; No changes in vision or hearing, sinus congestion, difficulty swallowing Pulmonary: Negative; No cough, wheezing, shortness of breath, hemoptysis Cardiovascular: Negative; No chest pain, presyncope, syncope, palpitations GI: Negative; No nausea, vomiting, diarrhea, or abdominal pain GU: Negative; No dysuria, hematuria, or difficulty voiding Musculoskeletal: Negative; no myalgias, joint pain, or weakness Hematologic/Oncology: Negative; no easy bruising, bleeding Endocrine: Positive for insulin resistance; no heat/cold intolerance; no diabetes Neuro: Negative; no changes in balance, headaches Skin: Negative; No rashes or skin lesions Psychiatric: Negative; No behavioral problems, depression Sleep: Negative; No snoring, daytime sleepiness, hypersomnolence, bruxism, restless legs, hypnogognic hallucinations, no cataplexy Other comprehensive 14 point system review is negative.   PE BP 120/64  Pulse 70  Ht 5\' 4"  (1.626 m)  Wt 193 lb 8 oz (87.771 kg)  BMI 33.20 kg/m2    General: Alert, oriented, no distress.  HEENT: Normocephalic, atraumatic. Pupils round and reactive; sclera anicteric; no xanthelasmas. Nose without nasal septal hypertrophy Mouth/Parynx benign; Mallinpatti scale 2/3 Neck: No JVD, no carotid bruits normal carotid Lungs: clear to ausculatation and percussion; no wheezing or rales Chest wall: No tenderness to palpation Heart: RRR, s1 s2 normal 1/6 systolic murmur. No diastolic murmur, rubs thrills or heaves Abdomen: Mildly protuberant with mild central adiposity. soft, nontender; no hepatosplenomehaly, BS+; abdominal aorta nontender and not dilated by palpation. Back: No CVA tenderness Pulses 2+ Extremities: no clubbinbg cyanosis or edema, Homan's sign negative  Neurologic: grossly nonfocal Psychological: Normal affect and mood  ECG (independently read by me): Normal sinus rhythm at 70 beats per minute.  Poor progression anteriorly.  Small Q-wave in lead 3.  Nonspecific T changes anteriorly, unchanged  04/16/2013 ECG (independently read by me): Normal sinus rhythm at 65 beats per minute. T wave changes V1 through V6, nonspecific, unchanged. QTc interval 455 ms  Prior ECG of October 15 2012: NSR @ 62 bpm with T-wave abnormality anteriorly V1 through V5  unchanged.  LABS:  BMET    Component Value Date/Time   NA 139 10/12/2013 0824   K 5.1 10/12/2013 0824   CL 105 10/12/2013 0824   CO2 28 10/12/2013 0824   GLUCOSE 99 10/12/2013 0824   BUN 11 10/12/2013 0824   CREATININE 0.79 10/12/2013 0824   CREATININE 0.62 09/22/2010 0510   CALCIUM 9.5 10/12/2013 0824   GFRNONAA >60 09/22/2010 0510   GFRAA >60 09/22/2010 0510     Hepatic Function Panel     Component Value Date/Time   PROT 6.5 10/12/2013 0824   ALBUMIN 4.2 10/12/2013 0824   AST 24 10/12/2013 0824   ALT 29 10/12/2013 0824   ALKPHOS 74 10/12/2013 0824   BILITOT 0.3 10/12/2013 0824     CBC    Component Value Date/Time   WBC 7.8 10/12/2013 0824   RBC 4.96 10/12/2013 0824   HGB 14.2  10/12/2013 0824   HCT 42.2 10/12/2013 0824   PLT 204 10/12/2013 0824   MCV 85.1 10/12/2013 0824   MCH 28.6 10/12/2013 0824   MCHC 33.6 10/12/2013 0824   RDW 14.7 10/12/2013 0824   LYMPHSABS 2.0 07/06/2010 0302   MONOABS 1.7* 07/06/2010 0302   EOSABS 0.0 07/06/2010 0302   BASOSABS 0.0 07/06/2010 0302     BNP    Component Value Date/Time   PROBNP 10535.0* 07/10/2010 0400    Lipid Panel     Component Value Date/Time   CHOL 105 10/12/2013 0824     RADIOLOGY: No results found.    ASSESSMENT AND PLAN: [name] is now 42  years following her initial anterior wall myocardial infarction with VF arrest for which she underwent PTCA emergently of her LAD in 1997. She is 2 years following emergency CABG surgery for life-threatening anatomy with 99% left main disease and total occlusion of her RCA at the ostium with left to right collaterals. Her ejection fraction did improve and is now 50-55% with small area of subtle LAD scar.  Her last nuclear perfusion study is low risk and does not reveal ischemia with a ejection fraction of 56%.  Presently, her blood pressure is well controlled on her current medical regimen consisting of Toprol-XL 100 mg.  She also has Lasix 20 mg, but takes this is an as-needed basis, and she denies recent leg swelling, and, therefore, has not been taking this.  Her lipids are excellent on combination therapy with Crestor 40 mg in addition to fish oil 2000 mg.  I commended her and her 10 pound weight loss over the past 6 months.  I have recommended she reduce her aspirin from 325 mg 81 mg.  She still is mildly oh piece with a body mass index of 33.2, and further weight loss is recommended.  I will see her in 6 months for reevaluation or sooner if problems arise.    Troy Sine, MD, [facility]  10/20/2013 4:39 PM

## 2013-11-03 ENCOUNTER — Other Ambulatory Visit: Payer: Self-pay | Admitting: *Deleted

## 2013-11-03 MED ORDER — ROSUVASTATIN CALCIUM 40 MG PO TABS: 40.0000 mg | ORAL_TABLET | Freq: Every day | ORAL | Status: DC

## 2013-11-03 MED ORDER — METOPROLOL SUCCINATE ER 100 MG PO TB24: 100.0000 mg | ORAL_TABLET | Freq: Every day | ORAL | Status: DC

## 2014-04-22 ENCOUNTER — Other Ambulatory Visit: Payer: Self-pay | Admitting: Dermatology

## 2014-06-21 ENCOUNTER — Telehealth: Payer: Self-pay | Admitting: Cardiovascular Disease

## 2014-06-21 ENCOUNTER — Other Ambulatory Visit: Payer: Self-pay | Admitting: *Deleted

## 2014-06-21 DIAGNOSIS — E785 Hyperlipidemia, unspecified: Secondary | ICD-10-CM

## 2014-06-21 NOTE — Telephone Encounter (Signed)
Pt called in stating that she would like some blood work ordered and the papers sent to her. Please f/u  Thanks

## 2014-06-21 NOTE — Telephone Encounter (Signed)
Pt. Called , no answer, lmtcb

## 2014-06-21 NOTE — Telephone Encounter (Signed)
Orders put in for lipid panel and left out front for pick up

## 2014-06-22 LAB — LIPID PANEL
CHOL/HDL RATIO: 3.3 ratio
Cholesterol: 117 mg/dL (ref 0–200)
HDL: 36 mg/dL — AB (ref >=46)
LDL CALC: 54 mg/dL (ref 0–99)
Triglycerides: 137 mg/dL (ref <150)
VLDL: 27 mg/dL (ref 0–40)

## 2014-06-23 ENCOUNTER — Encounter: Payer: Self-pay | Admitting: Cardiovascular Disease

## 2014-06-30 ENCOUNTER — Ambulatory Visit: Payer: No Typology Code available for payment source | Admitting: Cardiovascular Disease

## 2014-08-15 ENCOUNTER — Other Ambulatory Visit: Payer: Self-pay

## 2014-08-31 ENCOUNTER — Other Ambulatory Visit: Payer: Self-pay | Admitting: Thoracic Surgery (Cardiothoracic Vascular Surgery)

## 2014-08-31 DIAGNOSIS — C3410 Malignant neoplasm of upper lobe, unspecified bronchus or lung: Secondary | ICD-10-CM

## 2014-10-04 ENCOUNTER — Ambulatory Visit (INDEPENDENT_AMBULATORY_CARE_PROVIDER_SITE_OTHER): Payer: Medicare Other | Admitting: Thoracic Surgery (Cardiothoracic Vascular Surgery)

## 2014-10-04 ENCOUNTER — Encounter: Payer: Self-pay | Admitting: Thoracic Surgery (Cardiothoracic Vascular Surgery)

## 2014-10-04 ENCOUNTER — Ambulatory Visit
Admission: RE | Admit: 2014-10-04 | Discharge: 2014-10-04 | Disposition: A | Discharge status: Home / Self Care | Payer: Medicare Other | Source: Ambulatory Visit | Attending: Thoracic Surgery (Cardiothoracic Vascular Surgery) | Admitting: Thoracic Surgery (Cardiothoracic Vascular Surgery)

## 2014-10-04 VITALS — BP 138/72 | HR 73 | Resp 16 | Ht 64.0 in | Wt 200.0 lb

## 2014-10-04 DIAGNOSIS — Z9889 Other specified postprocedural states: Secondary | ICD-10-CM

## 2014-10-04 DIAGNOSIS — Z85118 Personal history of other malignant neoplasm of bronchus and lung: Secondary | ICD-10-CM

## 2014-10-04 DIAGNOSIS — Z902 Acquired absence of lung [part of]: Secondary | ICD-10-CM

## 2014-10-04 DIAGNOSIS — C3410 Malignant neoplasm of upper lobe, unspecified bronchus or lung: Secondary | ICD-10-CM

## 2014-10-04 NOTE — Progress Notes (Signed)
[address]       [place] [address]             [phone]      HPI: Mrs. Gallina returns today for a 1 year follow-up visit.  She is a 69 year old woman who presented with an acute MI in June 2012. She had emergency catheterization and then emergency bypass grafting. During the surgery a left upper lobe mass was palpated. She returned to about 4 weeks later and had a left upper lobectomy for what turned out to be a stage IA non-small cell carcinoma. She did not require adjuvant chemotherapy.  I last saw her a year ago. At that time she was without any evidence of recurrent disease. She had started smoking again because of stress associated with her parents having to go into assisted living.  She has been feeling well. Both of her parents did pass away over the past year. Says that she's been smoking due to stress related to that. She has not had any shortness of breath or angina. Her exercise tolerance is good. She has not had any significant weight loss. Her appetite is good. She has not had any unusual headaches or visual changes. She does have a cough. She denies hemoptysis  Past Medical History  Diagnosis Date  . CAD (coronary artery disease)   . HTN (hypertension)   . Dyslipidemia   . Tobacco abuse   . GERD (gastroesophageal reflux disease)   . Cancer   . Non-small cell lung cancer 08/2010    Stage IA, status post left upper lobectomy July 2012  . Myocardial infarct       Current Outpatient Prescriptions  Medication Sig Dispense Refill  . albuterol-ipratropium (COMBIVENT) 18-103 MCG/ACT inhaler Inhale 2 puffs into the lungs every 6 (six) hours as needed. 1 Inhaler 2  . aspirin 325 MG tablet Take 325 mg by mouth daily.      . Coenzyme Q10 (EQL COQ10) 300 MG CAPS Take by mouth.    . metoprolol succinate (TOPROL-XL) 100 MG 24 hr tablet Take 1 tablet (100 mg total) by mouth daily. Take with or immediately following a meal. 30 tablet 11  . Omega-3 Fatty  Acids (FISH OIL) 1000 MG CAPS Take 2,000 mg by mouth daily.    . rosuvastatin (CRESTOR) 40 MG tablet Take 1 tablet (40 mg total) by mouth daily. 30 tablet 6   No current facility-administered medications for this visit.    Physical Exam BP 138/72 mmHg  Pulse 73  Resp 16  Ht '5\' 4"'$  (1.626 m)  Wt 200 lb (90.719 kg)  BMI 34.31 kg/m2  SpO2 101% Obese 69 year old woman in no acute distress Alert and oriented 3 with no focal neurologic deficit No cervical or supraclavicular adenopathy Cardiac regular rate and rhythm normal S1 and S2 Sternal incision well-healed Left chest incisions well healed Diminished breath sounds at left base, otherwise clear, no wheezing  Diagnostic Tests: CT CHEST WITHOUT CONTRAST  TECHNIQUE: Multidetector CT imaging of the chest was performed following the standard protocol without IV contrast.  COMPARISON: Chest CTs 10/05/2013 and 04/13/2013.  FINDINGS: Mediastinum/Nodes: There are no enlarged mediastinal, hilar or axillary lymph nodes.Hilar assessment remains limited by the lack of intravenous contrast, although the hilar contours are unchanged. The thyroid gland, trachea and esophagus demonstrate no significant findings. The heart size is normal. There is no pericardial effusion. There is stable diffuse atherosclerosis of the aorta, great vessels and coronary arteries status post  median sternotomy.  Lungs/Pleura: There is no pleural effusion. There is stable volume loss in the left hemithorax status post left upper lobe resection. There is stable mild emphysema and subpleural reticulation. Nodular thickening along the minor fissure on image 29 is unchanged. There are no suspicious pulmonary nodules.  Upper abdomen: Stable appearance. There are stable small low-density hepatic lesions and no evidence of adrenal mass.  Musculoskeletal/Chest wall: There is no chest wall mass or suspicious osseous finding.  IMPRESSION: 1. Stable  postoperative chest CT. 2. No evidence of local recurrence or metastatic disease. 3. No acute findings.   Electronically Signed  By: Richardean Sale M.D.  On: 10/04/2014 11:34  I personally reviewed the CT scan and concur with findings as noted above. There is no evidence of recurrent disease.  Impression: Mrs. Pebley is a 69 year old woman who is now 4 years out from a left upper lobectomy for stage IA non-small cell carcinoma. She has no evidence of recurrent disease.  She does continue to smoke. Smoking cessation instruction/counseling given:  counseled patient on the dangers of tobacco use, advised patient to stop smoking, and reviewed strategies to maximize success. I emphasized the importance from both the standpoint of her lung cancer as well as her coronary disease.    Plan: Return in one year with CT of chest for 5 year follow-up  Quit smoking  I spent 10 minutes with Mrs. Pledger during this visit, greater than 50% was counseling  Melrose Nakayama, MD [facility] [phone]

## 2014-11-11 ENCOUNTER — Other Ambulatory Visit: Payer: Self-pay | Admitting: Cardiovascular Disease

## 2014-11-11 NOTE — Telephone Encounter (Signed)
REFILL 

## 2014-11-15 ENCOUNTER — Other Ambulatory Visit: Payer: Self-pay | Admitting: *Deleted

## 2014-11-15 MED ORDER — METOPROLOL SUCCINATE ER 100 MG PO TB24: 100.0000 mg | ORAL_TABLET | Freq: Every day | ORAL | Status: DC

## 2014-11-22 ENCOUNTER — Encounter: Payer: Self-pay | Admitting: Cardiovascular Disease

## 2014-11-22 ENCOUNTER — Ambulatory Visit (INDEPENDENT_AMBULATORY_CARE_PROVIDER_SITE_OTHER): Payer: Medicare Other | Admitting: Cardiovascular Disease

## 2014-11-22 VITALS — BP 150/58 | HR 77 | Ht 64.0 in | Wt 205.4 lb

## 2014-11-22 DIAGNOSIS — E785 Hyperlipidemia, unspecified: Secondary | ICD-10-CM

## 2014-11-22 DIAGNOSIS — Z720 Tobacco use: Secondary | ICD-10-CM

## 2014-11-22 DIAGNOSIS — I10 Essential (primary) hypertension: Secondary | ICD-10-CM | POA: Diagnosis not present

## 2014-11-22 DIAGNOSIS — C3411 Malignant neoplasm of upper lobe, right bronchus or lung: Secondary | ICD-10-CM | POA: Diagnosis not present

## 2014-11-22 DIAGNOSIS — I2510 Atherosclerotic heart disease of native coronary artery without angina pectoris: Secondary | ICD-10-CM

## 2014-11-22 DIAGNOSIS — I2583 Coronary atherosclerosis due to lipid rich plaque: Secondary | ICD-10-CM

## 2014-11-22 MED ORDER — METOPROLOL SUCCINATE ER 100 MG PO TB24: 100.0000 mg | ORAL_TABLET | Freq: Every day | ORAL | Status: DC

## 2014-11-22 MED ORDER — ROSUVASTATIN CALCIUM 40 MG PO TABS: 40.0000 mg | ORAL_TABLET | Freq: Every day | ORAL | Status: DC

## 2014-11-22 NOTE — Patient Instructions (Signed)
Your physician wants you to follow-up in: 1 year or sooner if needed. You will receive a reminder letter in the mail two months in advance. If you don't receive a letter, please call our office to schedule the follow-up appointment.  

## 2014-11-22 NOTE — Progress Notes (Signed)
Patient ID: [name], female   DOB: 1945-03-30, 69 y.o.   MRN: [id number] n       HPI: [name] is a 69 y.o. female who presents to the office today for a 1 year follow up cardiology evaluation.  [name] suffered initial anterior wall myocardial infarction with ventricular fibrillation arrest requiring resuscitation and PTCA of her LAD in 1997. On 07/05/2010 she presented with increasing shortness of breath and was found to have critical life threatening anatomy with 99% left main equivalent disease.   Later that afternoon she underwent emergent CABG revascularization surgery by Dr. Roxan Hockey with a LIMA to the LAD, a vein graft to the second branch of the obtuse marginal vessel, and vein graft to the PDA.  Chest x-rays disclosed a left upper lung nodule and ultimately this proved to be adenocarcinoma for which she ultimately underwent upper lobe resection in July 2012. Additional problems include hypertension, hyperlipidemia, obesity and probable metabolic syndrome. A nuclear perfusion study in July 2012 showed a post stress ejection fraction of 46% but otherwise fairly normal perfusion. EF at cath was 35% prior to surgery.   In December 2013 an NMR lipoprofile revealed that despite an LDL of 50 she had increased LDL particle #1312, and increased small LDL particle number at 761. Her insulin resistance was significantly elevated at 70. HDL particle number was low at 26.8 and a triglyceride of 152.evaluation.  An echo Doppler study on 08/04/2012 showed an ejection fraction at 50 - 55%. She had mild LVH. There was grade 1 diastolic dysfunction with distal anterior, anteroseptal and apical LAD scar. There is mild tricuspid regurgitation and mild mitral regurgitation. PA pressure estimated 32 mm.  A LexiScan study in 07/2012 was low risk. Ejection fraction was 56%. There is no evidence for ischemia. She had laboratory which showed an LDL particle number at 830 with a target LDL of 38 HDL  42 triglycerides 170. Her insulin resistance was elevated at 60. Hemoglobin A1c was 5.7.  Since I saw her one year ago, she has continued to be stable from a cardiac standpoint.  Unfortunately she is still smoking cigarettes.  She does not routinely exercise.  She denies any chest pain.  She denies palpitations.  She sees Dr. Roxan Hockey on a yearly basis and recently saw him on 10/04/2014.  A follow-up chest CT was reviewed and she continues to be stable without evidence of local recurrence or metastatic disease and she is now 4 years status post left upper lobectomy for stage IA non-small cell carcinoma.  She  underwent blood work earlier this year.  This was reviewed.  Her most recent lipid panel revealed total cholesterol 117, triglycerides 137, HDL 36, and LDL cholesterol 54.  She presents for evaluation.  Past Medical History  Diagnosis Date  . CAD (coronary artery disease)   . HTN (hypertension)   . Dyslipidemia   . Tobacco abuse   . GERD (gastroesophageal reflux disease)   . Cancer ([place])   . Non-small cell lung cancer ([place]) 08/2010    Stage IA, status post left upper lobectomy July 2012  . Myocardial infarct [facility])     Past Surgical History  Procedure Laterality Date  . Left vats  09/17/2010  . Coronary artery bypass graft  07/11/2010    LIMA to LAD; SVG to 2nd branch OM; SVG to posterior descending artery  . Tee without cardioversion  07/06/2010    during emergent CABG surgery; 2-3+ mitral regurgitation  . Cardiovascular stress test  09/11/2010    R/S MV - normal perfusion in all regions, EF 46%, no scintigraphic evidence of inducible myocardial ischemia; global LV systolic function mildly reduced; no significant wall motion abnormalities noted; Exercise capacity 7 METS; EKG negative for ischemia; low risk study, no signifcant change from previous study 08/2003  . Cardiac catheterization  07/06/2010    see CABG report - pt sent to OR    No Known Allergies  Current Outpatient  Prescriptions  Medication Sig Dispense Refill  . aspirin 81 MG tablet Take 81 mg by mouth daily.    Marland Kitchen albuterol-ipratropium (COMBIVENT) 18-103 MCG/ACT inhaler Inhale 2 puffs into the lungs every 6 (six) hours as needed. 1 Inhaler 2  . Coenzyme Q10 (EQL COQ10) 300 MG CAPS Take by mouth.    . metoprolol succinate (TOPROL-XL) 100 MG 24 hr tablet Take 1 tablet (100 mg total) by mouth daily. Take with or immediately following a meal. 30 tablet 11  . Omega-3 Fatty Acids (FISH OIL) 1000 MG CAPS Take 2,000 mg by mouth daily.    . rosuvastatin (CRESTOR) 40 MG tablet Take 1 tablet (40 mg total) by mouth daily. 30 tablet 11   No current facility-administered medications for this visit.    Also she is married has 3 children 5 grandchildren. She is an ex-smoker. She does not routinely exercise. There is no alcohol use.  ROS General: Negative; No fevers, chills, or night sweats; positive for purposeful weight loss HEENT: Negative; No changes in vision or hearing, sinus congestion, difficulty swallowing Pulmonary: Negative; No cough, wheezing, shortness of breath, hemoptysis Cardiovascular: Negative; No chest pain, presyncope, syncope, palpitations GI: Negative; No nausea, vomiting, diarrhea, or abdominal pain GU: Negative; No dysuria, hematuria, or difficulty voiding Musculoskeletal: Negative; no myalgias, joint pain, or weakness Hematologic/Oncology: Negative; no easy bruising, bleeding Endocrine: Positive for insulin resistance; no heat/cold intolerance; no diabetes Neuro: Negative; no changes in balance, headaches Skin: Negative; No rashes or skin lesions Psychiatric: Negative; No behavioral problems, depression Sleep: Negative; No snoring, daytime sleepiness, hypersomnolence, bruxism, restless legs, hypnogognic hallucinations, no cataplexy Other comprehensive 14 point system review is negative.   PE BP 150/58 mmHg  Pulse 77  Ht _0  (1.626 m)  Wt 205 lb 6.4 oz (93.169 kg)  BMI 35.24 kg/m2    Wt Readings from Last 3 Encounters:  11/22/14 205 lb 6.4 oz (93.169 kg)  10/04/14 200 lb (90.719 kg)  10/20/13 193 lb 8 oz (87.771 kg)   General: Alert, oriented, no distress.  HEENT: Normocephalic, atraumatic. Pupils round and reactive; sclera anicteric; no xanthelasmas. Nose without nasal septal hypertrophy Mouth/Parynx benign; Mallinpatti scale 2/3 Neck: No JVD, no carotid bruits normal carotid Lungs: clear to ausculatation and percussion; no wheezing or rales Chest wall: No tenderness to palpation Heart: RRR, s1 s2 normal 1/6 systolic murmur. No diastolic murmur, rubs thrills or heaves Abdomen: Mildly protuberant with mild central adiposity. soft, nontender; no hepatosplenomehaly, BS+; abdominal aorta nontender and not dilated by palpation. Back: No CVA tenderness Pulses 2+ Extremities: no clubbinbg cyanosis or edema, Homan's sign negative  Neurologic: grossly nonfocal Psychological: Normal affect and mood  ECG (independently read by me): Normal sinus rhythm at 77 bpm.  Nonspecific T changes anteriorly which are old.  Normal intervals.  September 2015 ECG (independently read by me): Normal sinus rhythm at 70 beats per minute.  Poor progression anteriorly.  Small Q-wave in lead 3.  Nonspecific T changes anteriorly, unchanged  04/16/2013 ECG (independently read by me): Normal sinus rhythm at 65 beats  per minute. T wave changes V1 through V6, nonspecific, unchanged. QTc interval 455 ms  Prior ECG of October 15 2012: NSR @ 62 bpm with T-wave abnormality anteriorly V1 through V5 unchanged.  LABS: BMP Latest Ref Rng 10/12/2013 03/18/2013 10/15/2012  Glucose 70 - 99 mg/dL 99 97 82  BUN 6 - 23 mg/dL _0 Creatinine 0.50 - 1.10 mg/dL 0.79 0.76 0.90  Sodium 135 - 145 mEq/L 139 139 141  Potassium 3.5 - 5.3 mEq/L 5.1 5.2 4.9  Chloride 96 - 112 mEq/L 105 106 107  CO2 19 - 32 mEq/L _1 Calcium 8.4 - 10.5 mg/dL 9.5 9.5 9.6   Hepatic Function Latest Ref Rng 10/12/2013 03/18/2013  07/22/2012  Total Protein 6.0 - 8.3 g/dL 6.5 6.8 6.4  Albumin 3.5 - 5.2 g/dL 4.2 4.0 4.1  AST 0 - 37 U/L _2 ALT 0 - 35 U/L _3 Alk Phosphatase 39 - 117 U/L 74 80 76  Total Bilirubin 0.2 - 1.2 mg/dL 0.3 0.4 0.3   CBC Latest Ref Rng 10/12/2013 03/18/2013 07/22/2012  WBC 4.0 - 10.5 K/uL 7.8 9.3 8.8  Hemoglobin 12.0 - 15.0 g/dL 14.2 14.5 13.7  Hematocrit 36.0 - 46.0 % 42.2 43.9 40.8  Platelets 150 - 400 K/uL 204 220 221   Lab Results  Component Value Date   MCV 85.1 10/12/2013   MCV 85.6 03/18/2013   MCV 85.7 07/22/2012   Lab Results  Component Value Date   TSH 6.772* 10/12/2013   Lab Results  Component Value Date   HGBA1C 5.7* 07/22/2012   Lipid Panel     Component Value Date/Time   CHOL 117 06/21/2014 0807   CHOL 114 07/22/2012 0840   TRIG 137 06/21/2014 0807   TRIG 170* 07/22/2012 0840   HDL 36* 06/21/2014 0807   HDL 42 07/22/2012 0840   CHOLHDL 3.3 06/21/2014 0807   VLDL 27 06/21/2014 0807   LDLCALC 54 06/21/2014 0807   LDLCALC 38 07/22/2012 0840      RADIOLOGY: No results found.    ASSESSMENT AND PLAN: [name] is a 69 year old Caucasian female who is 19 years following her initial anterior wall myocardial infarction with VF arrest for which she underwent PTCA emergently of her LAD in 1997. She is 4 years following emergency CABG surgery for life-threatening anatomy with 99% left main disease and total occlusion of her RCA at the ostium with left to right collaterals. Her ejection fraction did improve and is now 50-55% with small area of subtle LAD scar.  Her last nuclear perfusion study is low risk and does not reveal ischemia with a ejection fraction of 56%.  At the time of her surgery, she subsequently was found to have early stage IA non-small cell carcinoma for which he underwent left upper lobe lobectomy and did not require adjuvant chemotherapy.  Follow-up chest CTs have remained stable.  Unfortunately, she resumed smoking and is still smoking on  a daily basis.  I had a long discussion with her today regarding the importance of complete smoking cessation.  She also has gained 12 pounds since her last evaluation with me one year ago.  We discussed importance of increased exercise and weight loss.  Last year and recommended she reduce her aspirin to 81 mg but she has continued to take 325.  I recommended she reduce this back to 81 mg.  Her blood pressure today was improved on recheck by me at 130/70 on her  current dose of Toprol-XL 100 mg.  She continues to take Crestor and coenzyme Q 10.  In addition to fish oil for her hyperlipidemia.  Her ECG is unchanged and shows previously noted anterior T-wave abnormalities.  As long as she remains stable, I will see her one year for reevaluation.  Time spent: 25 minutes  Troy Sine, MD, [facility]  11/22/2014 12:46 PM

## 2014-12-06 NOTE — Addendum Note (Signed)
Addended by: Diana Eves on: 12/06/2014 01:57 PM   Modules accepted: Orders

## 2015-02-14 ENCOUNTER — Other Ambulatory Visit: Payer: Self-pay | Admitting: Cardiovascular Disease

## 2015-02-14 MED ORDER — METOPROLOL SUCCINATE ER 100 MG PO TB24: 100.0000 mg | ORAL_TABLET | Freq: Every day | ORAL | Status: DC

## 2015-02-14 MED ORDER — ROSUVASTATIN CALCIUM 40 MG PO TABS: 40.0000 mg | ORAL_TABLET | Freq: Every day | ORAL | Status: DC

## 2015-03-21 ENCOUNTER — Encounter: Payer: Self-pay | Admitting: Thoracic Surgery (Cardiothoracic Vascular Surgery)

## 2015-05-02 DIAGNOSIS — H1859 Other hereditary corneal dystrophies: Secondary | ICD-10-CM | POA: Diagnosis not present

## 2015-05-02 DIAGNOSIS — H524 Presbyopia: Secondary | ICD-10-CM | POA: Diagnosis not present

## 2015-05-02 DIAGNOSIS — H2513 Age-related nuclear cataract, bilateral: Secondary | ICD-10-CM | POA: Diagnosis not present

## 2015-05-02 DIAGNOSIS — H25013 Cortical age-related cataract, bilateral: Secondary | ICD-10-CM | POA: Diagnosis not present

## 2015-07-26 ENCOUNTER — Encounter: Payer: Self-pay | Admitting: Cardiovascular Disease

## 2015-08-31 ENCOUNTER — Other Ambulatory Visit: Payer: Self-pay | Admitting: Thoracic Surgery (Cardiothoracic Vascular Surgery)

## 2015-08-31 DIAGNOSIS — C3410 Malignant neoplasm of upper lobe, unspecified bronchus or lung: Secondary | ICD-10-CM

## 2015-09-01 ENCOUNTER — Other Ambulatory Visit: Payer: Self-pay | Admitting: Thoracic Surgery (Cardiothoracic Vascular Surgery)

## 2015-09-01 DIAGNOSIS — C3412 Malignant neoplasm of upper lobe, left bronchus or lung: Secondary | ICD-10-CM

## 2015-09-01 DIAGNOSIS — C3492 Malignant neoplasm of unspecified part of left bronchus or lung: Secondary | ICD-10-CM

## 2015-09-01 DIAGNOSIS — C3410 Malignant neoplasm of upper lobe, unspecified bronchus or lung: Secondary | ICD-10-CM

## 2015-09-04 ENCOUNTER — Other Ambulatory Visit: Payer: Self-pay | Admitting: *Deleted

## 2015-09-04 DIAGNOSIS — I10 Essential (primary) hypertension: Secondary | ICD-10-CM

## 2015-09-04 DIAGNOSIS — E785 Hyperlipidemia, unspecified: Secondary | ICD-10-CM

## 2015-09-04 DIAGNOSIS — Z79899 Other long term (current) drug therapy: Secondary | ICD-10-CM

## 2015-09-04 DIAGNOSIS — I2510 Atherosclerotic heart disease of native coronary artery without angina pectoris: Secondary | ICD-10-CM

## 2015-09-11 DIAGNOSIS — Z79899 Other long term (current) drug therapy: Secondary | ICD-10-CM | POA: Diagnosis not present

## 2015-09-11 DIAGNOSIS — I10 Essential (primary) hypertension: Secondary | ICD-10-CM | POA: Diagnosis not present

## 2015-09-11 DIAGNOSIS — E785 Hyperlipidemia, unspecified: Secondary | ICD-10-CM | POA: Diagnosis not present

## 2015-09-11 DIAGNOSIS — I2510 Atherosclerotic heart disease of native coronary artery without angina pectoris: Secondary | ICD-10-CM | POA: Diagnosis not present

## 2015-09-11 LAB — COMPREHENSIVE METABOLIC PANEL
ALBUMIN: 4.1 g/dL (ref 3.6–5.1)
ALT: 22 U/L (ref 6–29)
AST: 18 U/L (ref 10–35)
Alkaline Phosphatase: 62 U/L (ref 33–130)
BUN: 12 mg/dL (ref 7–25)
CALCIUM: 9.4 mg/dL (ref 8.6–10.4)
CHLORIDE: 108 mmol/L (ref 98–110)
CO2: 28 mmol/L (ref 20–31)
CREATININE: 0.83 mg/dL (ref 0.50–0.99)
Glucose, Bld: 96 mg/dL (ref 65–99)
POTASSIUM: 5 mmol/L (ref 3.5–5.3)
SODIUM: 142 mmol/L (ref 135–146)
Total Bilirubin: 0.4 mg/dL (ref 0.2–1.2)
Total Protein: 6.3 g/dL (ref 6.1–8.1)

## 2015-09-11 LAB — LIPID PANEL
CHOL/HDL RATIO: 3.2 ratio (ref <=5.0)
Cholesterol: 123 mg/dL — ABNORMAL LOW (ref 125–200)
HDL: 38 mg/dL — AB (ref >=46)
LDL CALC: 53 mg/dL (ref <130)
Triglycerides: 160 mg/dL — ABNORMAL HIGH (ref <150)
VLDL: 32 mg/dL — ABNORMAL HIGH (ref <30)

## 2015-09-11 LAB — CBC
HCT: 43.7 % (ref 35.0–45.0)
HEMOGLOBIN: 14.4 g/dL (ref 11.7–15.5)
MCH: 28.9 pg (ref 27.0–33.0)
MCHC: 33 g/dL (ref 32.0–36.0)
MCV: 87.8 fL (ref 80.0–100.0)
MPV: 11.2 fL (ref 7.5–12.5)
Platelets: 191 10*3/uL (ref 140–400)
RBC: 4.98 MIL/uL (ref 3.80–5.10)
RDW: 13.8 % (ref 11.0–15.0)
WBC: 8.1 10*3/uL (ref 3.8–10.8)

## 2015-09-11 LAB — TSH: TSH: 6.12 m[IU]/L — AB

## 2015-10-09 ENCOUNTER — Telehealth: Payer: Self-pay | Admitting: *Deleted

## 2015-10-09 DIAGNOSIS — E039 Hypothyroidism, unspecified: Secondary | ICD-10-CM

## 2015-10-09 MED ORDER — LEVOTHYROXINE SODIUM 50 MCG PO TABS: 50.0000 ug | ORAL_TABLET | Freq: Every day | ORAL | 12 refills | Status: DC

## 2015-10-09 NOTE — Telephone Encounter (Signed)
Left message for pt to call.

## 2015-10-09 NOTE — Telephone Encounter (Signed)
Spoke with pt, aware of dr Claiborne Billings recommendations. New script sent to the pharmacy Lab orders mailed to the pt

## 2015-10-09 NOTE — Telephone Encounter (Signed)
-----   Message from Troy Sine, MD sent at 10/08/2015 12:58 PM EDT ----- Labs okay, but TSH remains slightly elevated.  Consider starting Synthroid at 50 g with follow-up TSH in one month or follow-up with primary care physician

## 2015-10-10 ENCOUNTER — Ambulatory Visit (INDEPENDENT_AMBULATORY_CARE_PROVIDER_SITE_OTHER): Payer: Medicare Other | Admitting: Thoracic Surgery (Cardiothoracic Vascular Surgery)

## 2015-10-10 ENCOUNTER — Ambulatory Visit
Admission: RE | Admit: 2015-10-10 | Discharge: 2015-10-10 | Disposition: A | Discharge status: Home / Self Care | Payer: Medicare Other | Source: Ambulatory Visit | Attending: Thoracic Surgery (Cardiothoracic Vascular Surgery) | Admitting: Thoracic Surgery (Cardiothoracic Vascular Surgery)

## 2015-10-10 ENCOUNTER — Encounter: Payer: Self-pay | Admitting: Thoracic Surgery (Cardiothoracic Vascular Surgery)

## 2015-10-10 VITALS — BP 144/74 | HR 69 | Resp 16 | Ht 64.0 in | Wt 202.0 lb

## 2015-10-10 DIAGNOSIS — I2510 Atherosclerotic heart disease of native coronary artery without angina pectoris: Secondary | ICD-10-CM | POA: Diagnosis not present

## 2015-10-10 DIAGNOSIS — Z902 Acquired absence of lung [part of]: Secondary | ICD-10-CM

## 2015-10-10 DIAGNOSIS — Z85118 Personal history of other malignant neoplasm of bronchus and lung: Secondary | ICD-10-CM | POA: Diagnosis not present

## 2015-10-10 DIAGNOSIS — C3492 Malignant neoplasm of unspecified part of left bronchus or lung: Secondary | ICD-10-CM

## 2015-10-10 DIAGNOSIS — J439 Emphysema, unspecified: Secondary | ICD-10-CM | POA: Diagnosis not present

## 2015-10-10 NOTE — Progress Notes (Signed)
[address]       [place] [address]             [phone]    HPI: Ms. [name] returns today for her 5 year follow-up visit.  She is a 70 year old woman presented with an acute MI in June 2012. She had emergent coronary bypass grafting. During surgery she was found to have a left upper lobe mass. After recovering from her CABG she returned and had a left upper lobectomy. Her tumor turned out to be a stage IA non-small cell carcinoma. She did not require adjuvant therapy.  I have been following her since that time. She has had no evidence recurrent disease.  She says that she has been feeling well. She denies any chest pain, pressure, or tightness. She gets short of breath with very heavy exertion, but is able to mow her yard without any issues. She has a chronic cough which is usually nonproductive. She denies any hemoptysis. Her appetite is good she has not had any weight loss. She recently was started on levothyroxine for hypothyroidism. She denies any unusual headaches or visual changes.  Past Medical History:  Diagnosis Date  . CAD (coronary artery disease)   . Cancer ([place])   . Dyslipidemia   . GERD (gastroesophageal reflux disease)   . HTN (hypertension)   . Myocardial infarct ([place])   . Non-small cell lung cancer ([place]) 08/2010   Stage IA, status post left upper lobectomy July 2012  . Tobacco abuse      Current Outpatient Prescriptions  Medication Sig Dispense Refill  . aspirin 81 MG tablet Take 81 mg by mouth daily.    . Coenzyme Q10 (EQL COQ10) 300 MG CAPS Take by mouth.    . levothyroxine (SYNTHROID) 50 MCG tablet Take 1 tablet (50 mcg total) by mouth daily before breakfast. 30 tablet 12  . metoprolol succinate (TOPROL-XL) 100 MG 24 hr tablet Take 1 tablet (100 mg total) by mouth daily. Take with or immediately following a meal. 90 tablet 3  . Omega-3 Fatty Acids (FISH OIL) 1000 MG CAPS Take 2,000 mg by mouth daily.    . rosuvastatin (CRESTOR) 40  MG tablet Take 1 tablet (40 mg total) by mouth daily. 90 tablet 3   No current facility-administered medications for this visit.     Physical Exam BP (!) 144/74   Pulse 69   Resp 16   Ht '5\' 4"'$  (1.626 m)   Wt 202 lb (91.6 kg)   SpO2 95% Comment: ON RA  BMI 34.43 kg/m  70 year old woman in no acute distress Alert and oriented 3 with no focal deficits No cervical or supraclavicular adenopathy Cardiac regular rate and rhythm normal S1 and S2 Lungs Ms. breath sounds left base, otherwise clear  Diagnostic Tests: CT CHEST WITHOUT CONTRAST  TECHNIQUE: Multidetector CT imaging of the chest was performed following the standard protocol without IV contrast.  COMPARISON:  10/04/2014  FINDINGS: Cardiovascular: Heart size normal. Patient is status post CABG. No pericardial effusion. No thoracic aortic aneurysm.  Mediastinum/Nodes: No mediastinal lymphadenopathy. No evidence for gross hilar lymphadenopathy although assessment is limited by the lack of intravenous contrast on today's study. There is no axillary lymphadenopathy.  Lungs/Pleura: Changes of emphysema again noted bilaterally. Postsurgical scarring in the left lung compatible with previous left upper lobectomy no evidence for new pulmonary nodule or mass. No focal airspace consolidation. No pulmonary edema or pleural effusion.  Upper Abdomen: Visualized portions of the  liver and spleen have normal uninfused appearance no adrenal nodule or mass.  Musculoskeletal: Bone windows reveal no worrisome lytic or sclerotic osseous lesions.  IMPRESSION: 1. Stable exam. No evidence for recurrent or metastatic disease in the thorax. 2. Thoracoabdominal aortic atherosclerosis. 3. Emphysema   Electronically Signed   By: [name] M.D.   On: 10/10/2015 13:53 I personally reviewed the CT chest concurrent with findings noted above  Impression: [name] is a 70 year old woman who had coronary bypass grafting  in 2012 followed shortly thereafter by a left upper lobectomy for stage IA non-small cell carcinoma. She is now 5 years out from her resection with no evidence of recurrent disease. Although 5 years out from cancers and no need for continued follow-up in that regard, she does meet criteria for the low dose CT screening with greater than 30-pack-year history of smoking and ongoing tobacco abuse.  Tobacco abuse- she continues to smoke. The importance of tobacco cessation was discussed in detail with her. She understands the importance. She has stopped from time to time but currently is smoking.  Coronary artery disease- no angina.  Atherosclerotic cardiovascular disease- she has thoracic and abdominal aortic atherosclerosis. There is no aneurysm. She is on a statin.  Plan: Return in 1 year with low-dose screening CT of chest  Melrose Nakayama, MD [facility] [phone]

## 2015-11-02 ENCOUNTER — Telehealth: Payer: Self-pay | Admitting: Cardiovascular Disease

## 2015-11-02 NOTE — Telephone Encounter (Signed)
New message ° ° ° ° °Returning a call to the nurse °

## 2015-11-02 NOTE — Telephone Encounter (Signed)
Patient was given results and has been taking the Synthroid. She is going to be about a week or so later getting follow up labs secondary to missing about 5 days because she was out of town   Cristopher Estimable, [place]   10/09/15 3:32 PM  Note    Spoke with pt, aware of dr Claiborne Billings recommendations. New script sent to the pharmacy Lab orders mailed to the pt        Notes Recorded by Luanna Salk, LPN on 4/68/0321 at 22:48 PM EDT Patient called no answer.Deputy. ------  Notes Recorded by Troy Sine, MD on 10/08/2015 at 12:58 PM EDT Labs okay, but TSH remains slightly elevated. Consider starting Synthroid at 50 g with follow-up TSH in one month or follow-up with primary care physician

## 2015-11-20 DIAGNOSIS — E039 Hypothyroidism, unspecified: Secondary | ICD-10-CM | POA: Diagnosis not present

## 2015-11-21 LAB — TSH: TSH: 4.17 m[IU]/L

## 2015-11-28 ENCOUNTER — Encounter: Payer: Self-pay | Admitting: Cardiovascular Disease

## 2015-11-28 ENCOUNTER — Ambulatory Visit (INDEPENDENT_AMBULATORY_CARE_PROVIDER_SITE_OTHER): Payer: Medicare Other | Admitting: Cardiovascular Disease

## 2015-11-28 VITALS — BP 140/74 | HR 65 | Ht 64.0 in | Wt 204.6 lb

## 2015-11-28 DIAGNOSIS — Z720 Tobacco use: Secondary | ICD-10-CM

## 2015-11-28 DIAGNOSIS — I10 Essential (primary) hypertension: Secondary | ICD-10-CM | POA: Diagnosis not present

## 2015-11-28 DIAGNOSIS — I2583 Coronary atherosclerosis due to lipid rich plaque: Secondary | ICD-10-CM | POA: Diagnosis not present

## 2015-11-28 DIAGNOSIS — E785 Hyperlipidemia, unspecified: Secondary | ICD-10-CM

## 2015-11-28 DIAGNOSIS — E668 Other obesity: Secondary | ICD-10-CM

## 2015-11-28 DIAGNOSIS — I2510 Atherosclerotic heart disease of native coronary artery without angina pectoris: Secondary | ICD-10-CM

## 2015-11-28 DIAGNOSIS — C3411 Malignant neoplasm of upper lobe, right bronchus or lung: Secondary | ICD-10-CM

## 2015-11-28 NOTE — Progress Notes (Signed)
Patient ID: [name], female   DOB: [birth year], 70 y.o.   MRN: [id number] n       HPI: [name] is a 70 y.o. female who presents to the office today for a 1 year follow up cardiology evaluation.  [name] suffered initial anterior wall myocardial infarction with ventricular fibrillation arrest requiring resuscitation and PTCA of her LAD in 1997. On 07/05/2010 she presented with increasing shortness of breath and was found to have critical life threatening anatomy with 99% left main equivalent disease.   Later that afternoon she underwent emergent CABG revascularization surgery by Dr. Roxan Hockey with a LIMA to the LAD, a vein graft to the second branch of the obtuse marginal vessel, and vein graft to the PDA.  Chest x-rays disclosed a left upper lung nodule and ultimately this proved to be adenocarcinoma for which she ultimately underwent upper lobe resection in July 2012. Additional problems include hypertension, hyperlipidemia, obesity and probable metabolic syndrome. A nuclear perfusion study in July 2012 showed a post stress ejection fraction of 46% but otherwise fairly normal perfusion. EF at cath was 35% prior to surgery.   In December 2013 an NMR lipoprofile revealed that despite an LDL of 50 she had increased LDL particle #1312, and increased small LDL particle number at 761. Her insulin resistance was significantly elevated at 70. HDL particle number was low at 26.8 and a triglyceride of 152.evaluation.  An echo Doppler study on 08/04/2012 showed an ejection fraction at 50 - 55%. She had mild LVH. There was grade 1 diastolic dysfunction with distal anterior, anteroseptal and apical LAD scar. There is mild tricuspid regurgitation and mild mitral regurgitation. PA pressure estimated 32 mm.  A LexiScan study in 07/2012 was low risk. Ejection fraction was 56%. There is no evidence for ischemia. She had laboratory which showed an LDL particle number at 830 with a target LDL of 38 HDL  42 triglycerides 170. Her insulin resistance was elevated at 60. Hemoglobin A1c was 5.7.  Since I saw her one year ago, she has continued to be stable from a cardiac standpoint.  Unfortunately she is still smoking cigarettes and is smoking one pack of cigarettes every 3 days.  She does not routinely exercise.  She denies any chest pain.  She denies palpitations.  She sees Dr. Roxan Hockey on a yearly basis and recently saw him in August 2017..  A follow-up chest CT was reviewed and she continues to be stable without evidence of local recurrence or metastatic disease.  She is now 5 years status post left upper lobectomy for stage IA non-small cell carcinoma.  She presents for one-year evaluation.  Past Medical History:  Diagnosis Date  . CAD (coronary artery disease)   . Cancer ([place])   . Dyslipidemia   . GERD (gastroesophageal reflux disease)   . HTN (hypertension)   . Myocardial infarct   . Non-small cell lung cancer ([place]) 08/2010   Stage IA, status post left upper lobectomy July 2012  . Tobacco abuse     Past Surgical History:  Procedure Laterality Date  . CARDIAC CATHETERIZATION  07/06/2010   see CABG report - pt sent to OR  . CARDIOVASCULAR STRESS TEST  09/11/2010   R/S MV - normal perfusion in all regions, EF 46%, no scintigraphic evidence of inducible myocardial ischemia; global LV systolic function mildly reduced; no significant wall motion abnormalities noted; Exercise capacity 7 METS; EKG negative for ischemia; low risk study, no signifcant change from previous study 08/2003  .  CORONARY ARTERY BYPASS GRAFT  07/11/2010   LIMA to LAD; SVG to 2nd branch OM; SVG to posterior descending artery  . LEFT VATS  09/17/2010  . TEE WITHOUT CARDIOVERSION  07/06/2010   during emergent CABG surgery; 2-3+ mitral regurgitation    No Known Allergies  Current Outpatient Prescriptions  Medication Sig Dispense Refill  . aspirin 81 MG tablet Take 81 mg by mouth daily.    . Coenzyme Q10 (EQL COQ10)  300 MG CAPS Take by mouth.    . levothyroxine (SYNTHROID) 50 MCG tablet Take 1 tablet (50 mcg total) by mouth daily before breakfast. 30 tablet 12  . metoprolol succinate (TOPROL-XL) 100 MG 24 hr tablet Take 1 tablet (100 mg total) by mouth daily. Take with or immediately following a meal. 90 tablet 3  . Omega-3 Fatty Acids (FISH OIL) 1000 MG CAPS Take 2,000 mg by mouth daily.    . rosuvastatin (CRESTOR) 40 MG tablet Take 1 tablet (40 mg total) by mouth daily. 90 tablet 3   No current facility-administered medications for this visit.     Also she is married has 3 children 5 grandchildren. She is an ex-smoker. She does not routinely exercise. There is no alcohol use.  ROS General: Negative; No fevers, chills, or night sweats; positive for purposeful weight loss HEENT: Negative; No changes in vision or hearing, sinus congestion, difficulty swallowing Pulmonary: Negative; No cough, wheezing, shortness of breath, hemoptysis Cardiovascular: Negative; No chest pain, presyncope, syncope, palpitations GI: Negative; No nausea, vomiting, diarrhea, or abdominal pain GU: Negative; No dysuria, hematuria, or difficulty voiding Musculoskeletal: Negative; no myalgias, joint pain, or weakness Hematologic/Oncology: Negative; no easy bruising, bleeding Endocrine: Positive for insulin resistance; no heat/cold intolerance; no diabetes Neuro: Negative; no changes in balance, headaches Skin: Negative; No rashes or skin lesions Psychiatric: Negative; No behavioral problems, depression Sleep: Negative; No snoring, daytime sleepiness, hypersomnolence, bruxism, restless legs, hypnogognic hallucinations, no cataplexy Other comprehensive 14 point system review is negative.   PE BP 140/74 (BP Location: Left Arm, Patient Position: Sitting, Cuff Size: Normal)   Pulse 65   Ht '5\' 4"'  (1.626 m)   Wt 204 lb 9.6 oz (92.8 kg)   BMI 35.12 kg/m    Repeat blood pressure by me was 110/70  Wt Readings from Last 3  Encounters:  11/28/15 204 lb 9.6 oz (92.8 kg)  10/10/15 202 lb (91.6 kg)  11/22/14 205 lb 6.4 oz (93.2 kg)   General: Alert, oriented, no distress.  HEENT: Normocephalic, atraumatic. Pupils round and reactive; sclera anicteric; no xanthelasmas. Nose without nasal septal hypertrophy Mouth/Parynx benign; Mallinpatti scale 2/3 Neck: No JVD, no carotid bruits normal carotid Lungs: clear to ausculatation and percussion; no wheezing or rales Chest wall: No tenderness to palpation Heart: RRR, s1 s2 normal 1/6 systolic murmur. No diastolic murmur, rubs thrills or heaves Abdomen: Mildly protuberant with mild central adiposity. soft, nontender; no hepatosplenomehaly, BS+; abdominal aorta nontender and not dilated by palpation. Back: No CVA tenderness Pulses 2+ Extremities: no clubbinbg cyanosis or edema, Homan's sign negative  Neurologic: grossly nonfocal Psychological: Normal affect and mood  ECG (independently read by me): Normal sinus rhythm at 65 bpm.  Nonspecific T changes.  Q wave in lead 3.  October 2016 ECG (independently read by me): Normal sinus rhythm at 77 bpm.  Nonspecific T changes anteriorly which are old.  Normal intervals.  September 2015 ECG (independently read by me): Normal sinus rhythm at 70 beats per minute.  Poor progression anteriorly.  Small Q-wave in  lead 3.  Nonspecific T changes anteriorly, unchanged  04/16/2013 ECG (independently read by me): Normal sinus rhythm at 65 beats per minute. T wave changes V1 through V6, nonspecific, unchanged. QTc interval 455 ms  Prior ECG of October 15 2012: NSR @ 62 bpm with T-wave abnormality anteriorly V1 through V5 unchanged.  LABS: BMP Latest Ref Rng & Units 09/11/2015 10/12/2013 03/18/2013  Glucose 65 - 99 mg/dL 96 99 97  BUN 7 - 25 mg/dL '12 11 13  ' Creatinine 0.50 - 0.99 mg/dL 0.83 0.79 0.76  Sodium 135 - 146 mmol/L 142 139 139  Potassium 3.5 - 5.3 mmol/L 5.0 5.1 5.2  Chloride 98 - 110 mmol/L 108 105 106  CO2 20 - 31 mmol/L '28  28 27  ' Calcium 8.6 - 10.4 mg/dL 9.4 9.5 9.5   Hepatic Function Latest Ref Rng & Units 09/11/2015 10/12/2013 03/18/2013  Total Protein 6.1 - 8.1 g/dL 6.3 6.5 6.8  Albumin 3.6 - 5.1 g/dL 4.1 4.2 4.0  AST 10 - 35 U/L '18 24 16  ' ALT 6 - 29 U/L '22 29 21  ' Alk Phosphatase 33 - 130 U/L 62 74 80  Total Bilirubin 0.2 - 1.2 mg/dL 0.4 0.3 0.4   CBC Latest Ref Rng & Units 09/11/2015 10/12/2013 03/18/2013  WBC 3.8 - 10.8 K/uL 8.1 7.8 9.3  Hemoglobin 11.7 - 15.5 g/dL 14.4 14.2 14.5  Hematocrit 35.0 - 45.0 % 43.7 42.2 43.9  Platelets 140 - 400 K/uL 191 204 220   Lab Results  Component Value Date   MCV 87.8 09/11/2015   MCV 85.1 10/12/2013   MCV 85.6 03/18/2013   Lab Results  Component Value Date   TSH 4.17 11/20/2015   Lab Results  Component Value Date   HGBA1C 5.7 (H) 07/22/2012   Lipid Panel     Component Value Date/Time   CHOL 123 (L) 09/11/2015 0926   CHOL 114 07/22/2012 0840   TRIG 160 (H) 09/11/2015 0926   TRIG 170 (H) 07/22/2012 0840   HDL 38 (L) 09/11/2015 0926   HDL 42 07/22/2012 0840   CHOLHDL 3.2 09/11/2015 0926   VLDL 32 (H) 09/11/2015 0926   LDLCALC 53 09/11/2015 0926   LDLCALC 38 07/22/2012 0840      RADIOLOGY: No results found.    ASSESSMENT AND PLAN: [name] is a 70 year old Caucasian female who is 20 years following her initial anterior wall myocardial infarction with VF arrest for which she underwent PTCA emergently of her LAD in 1997. She is 5 years following emergency CABG surgery for life-threatening anatomy with 99% left main disease and total occlusion of her RCA at the ostium with left to right collaterals. Her ejection fraction did improve and is now 50-55% with small area of subtle LAD scar.  Her last nuclear perfusion study is low risk and does not reveal ischemia with a ejection fraction of 56%.  At the time of her surgery, she subsequently was found to have early stage IA non-small cell carcinoma for which he underwent left upper lobe lobectomy and did  not require adjuvant chemotherapy.  Follow-up chest CTs have remained stable.  Unfortunately, she resumed smoking and is now smoking one pack of cigarettes every 3 days.  I again had a long discussion with her concerning the importance of absolute smoking cessation.  She has moderate obesity and weight reduction was recommended.  She continues to be asymptomatic without recurrent anginal symptomatology.  Her blood pressure today is controlled on her current medical regimen with Toprol  100 mg.  Recent TSH was normal at her dose now of 50 g of levothyroxine.  She continues to be on aspirin.  Lipid studies are very good on Crestor 40 mg and fish oil .  Her triglycerides remain mildly elevated.  Improved diet was recommended.  Her last nuclear perfusion study was in 2014.  She will continue her current medical regimen.  We discussed importance of smoking cessation.  Prior to me seeing her in one year.  I'm scheduling her for [employer] study next year, which before year since her last evaluation and 6 years following her CABG revascularization surgery.  Time spent: 25 minutes  Troy Sine, MD, [facility]  11/28/2015 7:20 PM

## 2015-11-28 NOTE — Patient Instructions (Signed)
Your physician has requested that you have a Rayne 1 YEAR OR SOONER IF NEEDED.

## 2016-03-03 ENCOUNTER — Other Ambulatory Visit: Payer: Self-pay | Admitting: Cardiovascular Disease

## 2016-03-04 NOTE — Telephone Encounter (Signed)
Rx(s) sent to pharmacy electronically.  

## 2016-03-27 ENCOUNTER — Other Ambulatory Visit: Payer: Self-pay

## 2016-03-27 DIAGNOSIS — H524 Presbyopia: Secondary | ICD-10-CM | POA: Diagnosis not present

## 2016-03-27 DIAGNOSIS — H25013 Cortical age-related cataract, bilateral: Secondary | ICD-10-CM | POA: Diagnosis not present

## 2016-03-27 DIAGNOSIS — H2513 Age-related nuclear cataract, bilateral: Secondary | ICD-10-CM | POA: Diagnosis not present

## 2016-03-27 DIAGNOSIS — H1859 Other hereditary corneal dystrophies: Secondary | ICD-10-CM | POA: Diagnosis not present

## 2016-03-27 MED ORDER — LEVOTHYROXINE SODIUM 50 MCG PO TABS: 50.0000 ug | ORAL_TABLET | Freq: Every day | ORAL | 3 refills | Status: DC

## 2016-04-11 DIAGNOSIS — H2511 Age-related nuclear cataract, right eye: Secondary | ICD-10-CM | POA: Diagnosis not present

## 2016-04-11 DIAGNOSIS — H25011 Cortical age-related cataract, right eye: Secondary | ICD-10-CM | POA: Diagnosis not present

## 2016-04-11 DIAGNOSIS — H25811 Combined forms of age-related cataract, right eye: Secondary | ICD-10-CM | POA: Diagnosis not present

## 2016-04-20 ENCOUNTER — Other Ambulatory Visit: Payer: Self-pay | Admitting: Cardiovascular Disease

## 2016-05-09 DIAGNOSIS — H2512 Age-related nuclear cataract, left eye: Secondary | ICD-10-CM | POA: Diagnosis not present

## 2016-05-09 DIAGNOSIS — H25812 Combined forms of age-related cataract, left eye: Secondary | ICD-10-CM | POA: Diagnosis not present

## 2016-05-09 DIAGNOSIS — H25012 Cortical age-related cataract, left eye: Secondary | ICD-10-CM | POA: Diagnosis not present

## 2016-06-19 ENCOUNTER — Encounter: Payer: Self-pay | Admitting: Cardiovascular Disease

## 2016-06-19 ENCOUNTER — Other Ambulatory Visit: Payer: Self-pay | Admitting: *Deleted

## 2016-06-19 DIAGNOSIS — I2510 Atherosclerotic heart disease of native coronary artery without angina pectoris: Secondary | ICD-10-CM

## 2016-06-19 DIAGNOSIS — R002 Palpitations: Secondary | ICD-10-CM

## 2016-06-26 DIAGNOSIS — I2510 Atherosclerotic heart disease of native coronary artery without angina pectoris: Secondary | ICD-10-CM | POA: Diagnosis not present

## 2016-06-26 DIAGNOSIS — R002 Palpitations: Secondary | ICD-10-CM | POA: Diagnosis not present

## 2016-06-26 LAB — CBC
HCT: 46 % — ABNORMAL HIGH (ref 35.0–45.0)
Hemoglobin: 15.1 g/dL (ref 11.7–15.5)
MCH: 29.6 pg (ref 27.0–33.0)
MCHC: 32.8 g/dL (ref 32.0–36.0)
MCV: 90.2 fL (ref 80.0–100.0)
MPV: 11.5 fL (ref 7.5–12.5)
PLATELETS: 214 10*3/uL (ref 140–400)
RBC: 5.1 MIL/uL (ref 3.80–5.10)
RDW: 14.3 % (ref 11.0–15.0)
WBC: 8.4 10*3/uL (ref 3.8–10.8)

## 2016-06-26 LAB — COMPREHENSIVE METABOLIC PANEL
ALK PHOS: 65 U/L (ref 33–130)
ALT: 30 U/L — AB (ref 6–29)
AST: 20 U/L (ref 10–35)
Albumin: 4.1 g/dL (ref 3.6–5.1)
BILIRUBIN TOTAL: 0.4 mg/dL (ref 0.2–1.2)
BUN: 13 mg/dL (ref 7–25)
CO2: 29 mmol/L (ref 20–31)
CREATININE: 0.86 mg/dL (ref 0.50–0.99)
Calcium: 9.4 mg/dL (ref 8.6–10.4)
Chloride: 106 mmol/L (ref 98–110)
GLUCOSE: 103 mg/dL — AB (ref 65–99)
Potassium: 5.5 mmol/L — ABNORMAL HIGH (ref 3.5–5.3)
Sodium: 143 mmol/L (ref 135–146)
TOTAL PROTEIN: 6.8 g/dL (ref 6.1–8.1)

## 2016-06-26 LAB — LIPID PANEL
CHOLESTEROL: 123 mg/dL (ref <200)
HDL: 39 mg/dL — ABNORMAL LOW (ref >50)
LDL Cholesterol: 59 mg/dL (ref <100)
Total CHOL/HDL Ratio: 3.2 Ratio (ref <5.0)
Triglycerides: 125 mg/dL (ref <150)
VLDL: 25 mg/dL (ref <30)

## 2016-06-26 LAB — TSH: TSH: 4.13 m[IU]/L

## 2016-07-02 ENCOUNTER — Other Ambulatory Visit: Payer: Self-pay | Admitting: *Deleted

## 2016-07-02 ENCOUNTER — Telehealth: Payer: Self-pay | Admitting: *Deleted

## 2016-07-02 DIAGNOSIS — I10 Essential (primary) hypertension: Secondary | ICD-10-CM | POA: Diagnosis not present

## 2016-07-02 DIAGNOSIS — Z79899 Other long term (current) drug therapy: Secondary | ICD-10-CM

## 2016-07-02 NOTE — Telephone Encounter (Signed)
-----   Message from Troy Sine, MD sent at 06/30/2016 10:47 PM EDT ----- Labs stable except potassium elevated.  He's not on any supplementation or medication to do this.  Avoid potassium-containing foods.  Recheck bmet this week

## 2016-07-02 NOTE — Telephone Encounter (Signed)
Left message to return a call.

## 2016-07-02 NOTE — Telephone Encounter (Signed)
Patient notified of lab results and recommendations. She voiced her understanding and will get repeat labs this week.

## 2016-07-04 LAB — BASIC METABOLIC PANEL
BUN: 10 mg/dL (ref 7–25)
CALCIUM: 9.3 mg/dL (ref 8.6–10.4)
CO2: 23 mmol/L (ref 20–31)
CREATININE: 0.81 mg/dL (ref 0.50–0.99)
Chloride: 106 mmol/L (ref 98–110)
GLUCOSE: 133 mg/dL — AB (ref 65–99)
Potassium: 4.6 mmol/L (ref 3.5–5.3)
SODIUM: 141 mmol/L (ref 135–146)

## 2016-09-03 ENCOUNTER — Other Ambulatory Visit: Payer: Self-pay | Admitting: Thoracic Surgery (Cardiothoracic Vascular Surgery)

## 2016-09-03 DIAGNOSIS — C3411 Malignant neoplasm of upper lobe, right bronchus or lung: Secondary | ICD-10-CM

## 2016-09-08 ENCOUNTER — Encounter: Payer: Self-pay | Admitting: Cardiovascular Disease

## 2016-10-03 ENCOUNTER — Encounter: Payer: Self-pay | Admitting: Cardiovascular Disease

## 2016-10-15 ENCOUNTER — Encounter: Payer: Self-pay | Admitting: Thoracic Surgery (Cardiothoracic Vascular Surgery)

## 2016-10-15 ENCOUNTER — Ambulatory Visit (INDEPENDENT_AMBULATORY_CARE_PROVIDER_SITE_OTHER): Payer: Medicare Other | Admitting: Thoracic Surgery (Cardiothoracic Vascular Surgery)

## 2016-10-15 ENCOUNTER — Ambulatory Visit
Admission: RE | Admit: 2016-10-15 | Discharge: 2016-10-15 | Disposition: A | Discharge status: Home / Self Care | Payer: Medicare Other | Source: Ambulatory Visit | Attending: Thoracic Surgery (Cardiothoracic Vascular Surgery) | Admitting: Thoracic Surgery (Cardiothoracic Vascular Surgery)

## 2016-10-15 VITALS — BP 170/87 | HR 75 | Resp 16 | Ht 64.0 in | Wt 200.0 lb

## 2016-10-15 DIAGNOSIS — Z85118 Personal history of other malignant neoplasm of bronchus and lung: Secondary | ICD-10-CM

## 2016-10-15 DIAGNOSIS — I2510 Atherosclerotic heart disease of native coronary artery without angina pectoris: Secondary | ICD-10-CM

## 2016-10-15 DIAGNOSIS — C3412 Malignant neoplasm of upper lobe, left bronchus or lung: Secondary | ICD-10-CM | POA: Diagnosis not present

## 2016-10-15 DIAGNOSIS — Z902 Acquired absence of lung [part of]: Secondary | ICD-10-CM | POA: Diagnosis not present

## 2016-10-15 DIAGNOSIS — C3411 Malignant neoplasm of upper lobe, right bronchus or lung: Secondary | ICD-10-CM

## 2016-10-15 NOTE — Progress Notes (Signed)
[address]       [place] [address]             [phone]    HPI: [name] returns for an annual follow-up visit.  [name] is a 71 year old woman who presented with an acute MI back in 2012. She underwent emergency coronary artery bypass grafting. During surgery she was noted have a left upper lobe mass. After recovering from CABG she underwent a left upper lobectomy. The tumor was a stage IA non-small cell carcinoma. She did not require any adjuvant therapy.  I last saw her in the office a year ago. She was doing well at that time and no evidence recurrent disease.  Today she says she has had some headaches over the weekend. She says she can tell her blood pressure is high due to headaches. She has been taking her medications. She continues to smoke about a pack a day. She denies any chest pain, pressure, tightness, or shortness of breath. She has gained some weight.  Past Medical History:  Diagnosis Date  . CAD (coronary artery disease)   . Cancer ([place])   . Dyslipidemia   . GERD (gastroesophageal reflux disease)   . HTN (hypertension)   . Myocardial infarct ([place])   . Non-small cell lung cancer ([place]) 08/2010   Stage IA, status post left upper lobectomy July 2012  . Tobacco abuse     Current Outpatient Prescriptions  Medication Sig Dispense Refill  . aspirin 81 MG tablet Take 81 mg by mouth daily.    . Coenzyme Q10 (EQL COQ10) 300 MG CAPS Take by mouth.    . levothyroxine (SYNTHROID) 50 MCG tablet Take 1 tablet (50 mcg total) by mouth daily before breakfast. 90 tablet 3  . metoprolol succinate (TOPROL-XL) 100 MG 24 hr tablet TAKE 1 TABLET (100 MG TOTAL) BY MOUTH DAILY. TAKE WITH OR IMMEDIATELY FOLLOWING A MEAL. 90 tablet 2  . Omega-3 Fatty Acids (FISH OIL) 1000 MG CAPS Take 2,000 mg by mouth daily.    . rosuvastatin (CRESTOR) 40 MG tablet TAKE 1 TABLET (40 MG TOTAL) BY MOUTH DAILY. 90 tablet 3   No current facility-administered medications for  this visit.     Physical Exam BP (!) 170/87 (BP Location: Left Arm)   Pulse 75   Resp 16   Ht 5\' 4"  (1.626 m)   Wt 200 lb (90.7 kg)   SpO2 95% Comment: ON RA  BMI 34.63 kg/m  71 year old woman in no acute distress Obese Alert and oriented 3 with no focal deficits No cervical or supraclavicular adenopathy Cardiac regular rate and rhythm normal S1 and S2 Lungs diminished at left base, otherwise clear, no wheezing   Diagnostic Tests: CT CHEST WITHOUT CONTRAST  TECHNIQUE: Multidetector CT imaging of the chest was performed following the standard protocol without IV contrast.  COMPARISON:  Chest CT 10/10/2015  FINDINGS: Cardiovascular: The heart is normal in size. No pericardial effusion. Stable tortuosity and calcification of the thoracic aorta. Stable surgical changes from coronary artery bypass surgery. Three-vessel coronary artery calcifications are noted.  Mediastinum/Nodes: Small scattered mediastinal and hilar lymph nodes are stable. No mass or overt adenopathy. The esophagus is normal.  Lungs/Pleura: Stable surgical changes from a left upper lobe lobectomy. No findings for recurrent tumor. Stable emphysematous changes and areas of pulmonary scarring. No worrisome pulmonary lesions or pulmonary nodules to suggest pulmonary metastatic disease. Stable triangular shaped nodular density near the right minor fissure on image  number 29 consistent with a lymph node. There is also a small subpleural nodular density in the right lower lobe on image number 83 which is likely a subpleural lymph node. No infiltrates or effusions. Mild dependent atelectasis.  Upper Abdomen: Small lower thoracic/ upper abdominal hernia containing fat. No findings to suggest upper abdominal metastatic disease. The adrenal glands and liver are grossly normal. Stable low-attenuation lesion in the left hepatic lobe laterally near the stomach which is likely a benign cyst.  Chest wall/  Musculoskeletal: No breast masses, supraclavicular or axillary lymphadenopathy. The thyroid gland appears normal.  No significant bony findings. Stable surgical changes from a median sternotomy.  IMPRESSION: 1. Stable surgical changes from a left upper lobe lobectomy. No findings suspicious for recurrent tumor, lymphadenopathy or metastatic disease. 2. Stable emphysematous changes and pulmonary scarring. 3. Stable three-vessel coronary artery calcifications and scattered aortic calcifications. 4. No acute pulmonary findings.  Aortic Atherosclerosis (ICD10-I70.0) and Emphysema (ICD10-J43.9).   Electronically Signed   By: Marijo Sanes M.D.   On: 10/15/2016 09:59 I personally reviewed the CT chest images and concur with findings noted above.  Impression: 71 year old woman with a history of coronary disease and date Ia non-small cell carcinoma the left upper lobe. She had coronary bypass grafting 2012 followed by a left upper lobectomy a few months later. She has had no evidence recurrence of either problem.  Lung cancer- now 6 years out from surgery. She does meet criteria for low do a CT screening and needs ongoing screening with annual low dose CT.  Hypertension- she's does have a blood pressure cuff at home but does not check her supplement regular basis. I advised her to check her self regularly and if her blood pressure remains above 1:61 systolic she needs to have additional medication added. She has lost touch with her primary care has not seen one in several years. I emphasized that her visits with me are not in place of a primary care complete evaluation and that she needs to reestablish with a primary care as soon as possible. If her blood pressure is elevated happy to start her on a medication but cannot be her primary care provider.  Plan: Return in 1 year with low-dose CT of chest  Melrose Nakayama, MD [facility] [phone]

## 2016-10-31 ENCOUNTER — Telehealth (HOSPITAL_COMMUNITY): Payer: Self-pay | Admitting: *Deleted

## 2016-10-31 NOTE — Telephone Encounter (Signed)
Left message on voicemail per DPR in reference to upcoming appointment scheduled on 11/05/16 with detailed instructions given per Myocardial Perfusion Study Information Sheet for the test. LM to arrive 15 minutes early, and that it is imperative to arrive on time for appointment to keep from having the test rescheduled. If you need to cancel or reschedule your appointment, please call the office within 24 hours of your appointment. Failure to do so may result in a cancellation of your appointment, and a $50 no show fee. Phone number given for call back for any questions. Kirstie Peri

## 2016-11-05 ENCOUNTER — Ambulatory Visit (HOSPITAL_COMMUNITY): Payer: Medicare Other | Attending: Cardiology

## 2016-11-05 DIAGNOSIS — Z8249 Family history of ischemic heart disease and other diseases of the circulatory system: Secondary | ICD-10-CM | POA: Insufficient documentation

## 2016-11-05 DIAGNOSIS — I2510 Atherosclerotic heart disease of native coronary artery without angina pectoris: Secondary | ICD-10-CM | POA: Insufficient documentation

## 2016-11-05 DIAGNOSIS — I2583 Coronary atherosclerosis due to lipid rich plaque: Secondary | ICD-10-CM | POA: Diagnosis not present

## 2016-11-05 DIAGNOSIS — I10 Essential (primary) hypertension: Secondary | ICD-10-CM | POA: Diagnosis not present

## 2016-11-05 LAB — MYOCARDIAL PERFUSION IMAGING
CHL CUP NUCLEAR SDS: 0
LHR: 0.36
LV sys vol: 50 mL
LVDIAVOL: 113 mL (ref 46–106)
NUC STRESS TID: 0.97
Peak HR: 88 {beats}/min
Rest HR: 62 {beats}/min
SRS: 6
SSS: 6

## 2016-11-05 MED ORDER — TECHNETIUM TC 99M TETROFOSMIN IV KIT
32.4000 | PACK | Freq: Once | INTRAVENOUS | Status: AC | PRN
  Administered 2016-11-05: 32.4 via INTRAVENOUS
  Filled 2016-11-05: qty 33

## 2016-11-05 MED ORDER — TECHNETIUM TC 99M TETROFOSMIN IV KIT
10.3000 | PACK | Freq: Once | INTRAVENOUS | Status: AC | PRN
  Administered 2016-11-05: 10.3 via INTRAVENOUS
  Filled 2016-11-05: qty 11

## 2016-11-05 MED ORDER — REGADENOSON 0.4 MG/5ML IV SOLN
0.4000 mg | Freq: Once | INTRAVENOUS | Status: AC
  Administered 2016-11-05: 0.4 mg via INTRAVENOUS

## 2016-11-06 DIAGNOSIS — H40053 Ocular hypertension, bilateral: Secondary | ICD-10-CM | POA: Diagnosis not present

## 2016-11-06 DIAGNOSIS — H1859 Other hereditary corneal dystrophies: Secondary | ICD-10-CM | POA: Diagnosis not present

## 2016-12-04 ENCOUNTER — Encounter: Payer: Self-pay | Admitting: Cardiovascular Disease

## 2016-12-04 ENCOUNTER — Ambulatory Visit (INDEPENDENT_AMBULATORY_CARE_PROVIDER_SITE_OTHER): Payer: Medicare Other | Admitting: Cardiovascular Disease

## 2016-12-04 VITALS — BP 136/73 | HR 73 | Ht 64.0 in | Wt 203.6 lb

## 2016-12-04 DIAGNOSIS — Z85118 Personal history of other malignant neoplasm of bronchus and lung: Secondary | ICD-10-CM | POA: Diagnosis not present

## 2016-12-04 DIAGNOSIS — E668 Other obesity: Secondary | ICD-10-CM

## 2016-12-04 DIAGNOSIS — E785 Hyperlipidemia, unspecified: Secondary | ICD-10-CM | POA: Diagnosis not present

## 2016-12-04 DIAGNOSIS — Z79899 Other long term (current) drug therapy: Secondary | ICD-10-CM | POA: Diagnosis not present

## 2016-12-04 DIAGNOSIS — I2510 Atherosclerotic heart disease of native coronary artery without angina pectoris: Secondary | ICD-10-CM

## 2016-12-04 DIAGNOSIS — Z720 Tobacco use: Secondary | ICD-10-CM

## 2016-12-04 DIAGNOSIS — I252 Old myocardial infarction: Secondary | ICD-10-CM

## 2016-12-04 DIAGNOSIS — I10 Essential (primary) hypertension: Secondary | ICD-10-CM

## 2016-12-04 DIAGNOSIS — I2583 Coronary atherosclerosis due to lipid rich plaque: Secondary | ICD-10-CM | POA: Diagnosis not present

## 2016-12-04 MED ORDER — ROSUVASTATIN CALCIUM 40 MG PO TABS: 40.0000 mg | ORAL_TABLET | Freq: Every day | ORAL | 3 refills | Status: DC

## 2016-12-04 MED ORDER — AMLODIPINE BESYLATE 5 MG PO TABS: 5.0000 mg | ORAL_TABLET | Freq: Every day | ORAL | 3 refills | Status: DC

## 2016-12-04 NOTE — Progress Notes (Signed)
Patient ID: [name], female   DOB: 14-Jan-1946, 71 y.o.   MRN: [id number] n       HPI: [name] is a 71 y.o. female who presents to the office today for a 1 year follow up cardiology evaluation.  Ms. [name] suffered initial anterior wall myocardial infarction with ventricular fibrillation arrest requiring resuscitation and PTCA of her LAD in 1997. On 07/05/2010 she presented with increasing shortness of breath and was found to have critical life threatening anatomy with 99% left main equivalent disease.   Later that afternoon she underwent emergent CABG revascularization surgery by Dr. Roxan Hockey with a LIMA to the LAD, a vein graft to the second branch of the obtuse marginal vessel, and vein graft to the PDA.  Chest x-rays disclosed a left upper lung nodule and ultimately this proved to be adenocarcinoma for which she ultimately underwent upper lobe resection in July 2012. Additional problems include hypertension, hyperlipidemia, obesity and probable metabolic syndrome. A nuclear perfusion study in July 2012 showed a post stress ejection fraction of 46% but otherwise fairly normal perfusion. EF at cath was 35% prior to surgery.   In December 2013 an NMR lipoprofile revealed that despite an LDL of 50 she had increased LDL particle #1312, and increased small LDL particle number at 761. Her insulin resistance was significantly elevated at 70. HDL particle number was low at 26.8 and a triglyceride of 152.evaluation.  An echo Doppler study on 08/04/2012 showed an ejection fraction at 50 - 55%. She had mild LVH. There was grade 1 diastolic dysfunction with distal anterior, anteroseptal and apical LAD scar. There is mild tricuspid regurgitation and mild mitral regurgitation. PA pressure estimated 32 mm.  A LexiScan study in 07/2012 was low risk. Ejection fraction was 56%. There is no evidence for ischemia. She had laboratory which showed an LDL particle number at 830 with a target LDL of 38 HDL  42 triglycerides 170. Her insulin resistance was elevated at 60. Hemoglobin A1c was 5.7.  When I last saw her in October 2017 she was still smoking one pack of cigarettes every 3 day.  She sees Dr. Roxan Hockey on a yearly basis and  saw him in August 2017..  A follow-up chest CT was reviewed and she was stable without evidence of local recurrence or metastatic disease status post left upper lobectomy for stage IA non-small cell carcinoma.  Over the past year, she denies any episodes of recurrent chest pain or awareness of palpitations.  She underwent a nuclear perfusion study on 11/05/2016, which revealed normal perfusion and function without scar or ischemia.  Of note, she last saw Dr. Roxan Hockey in August 2018 and during that evaluation, she was significantly hypertensive with a blood pressure 170/87.  She has been on Toprol-XL 100 mg, rosuvastatin 40 mg, and levothyroxine 50 g.  Unfortunately she still smokes and is smoking anywhere from 1-3/4 of a pack daily.  She had laboratory in May and on one chemistry evaluation.  Her potassium was 5.5, a follow-up evaluation showed normal potassium.  Cholesterol was 123, HDL 39, and LDL 59.  She presents for one-year evaluation.  Past Medical History:  Diagnosis Date  . CAD (coronary artery disease)   . Cancer ([name])   . Dyslipidemia   . GERD (gastroesophageal reflux disease)   . HTN (hypertension)   . Myocardial infarct ([name])   . Non-small cell lung cancer ([name]) 08/2010   Stage IA, status post left upper lobectomy July 2012  . Tobacco abuse  Past Surgical History:  Procedure Laterality Date  . CARDIAC CATHETERIZATION  07/06/2010   see CABG report - pt sent to OR  . CARDIOVASCULAR STRESS TEST  09/11/2010   R/S MV - normal perfusion in all regions, EF 46%, no scintigraphic evidence of inducible myocardial ischemia; global LV systolic function mildly reduced; no significant wall motion abnormalities noted; Exercise capacity 7 METS; EKG negative for  ischemia; low risk study, no signifcant change from previous study 08/2003  . CORONARY ARTERY BYPASS GRAFT  07/11/2010   LIMA to LAD; SVG to 2nd branch OM; SVG to posterior descending artery  . LEFT VATS  09/17/2010  . TEE WITHOUT CARDIOVERSION  07/06/2010   during emergent CABG surgery; 2-3+ mitral regurgitation    No Known Allergies  Current Outpatient Prescriptions  Medication Sig Dispense Refill  . aspirin 81 MG tablet Take 81 mg by mouth daily.    . Coenzyme Q10 (EQL COQ10) 300 MG CAPS Take by mouth.    . levothyroxine (SYNTHROID) 50 MCG tablet Take 1 tablet (50 mcg total) by mouth daily before breakfast. 90 tablet 3  . metoprolol succinate (TOPROL-XL) 100 MG 24 hr tablet TAKE 1 TABLET (100 MG TOTAL) BY MOUTH DAILY. TAKE WITH OR IMMEDIATELY FOLLOWING A MEAL. 90 tablet 2  . Omega-3 Fatty Acids (FISH OIL) 1000 MG CAPS Take 2,000 mg by mouth daily.    . rosuvastatin (CRESTOR) 40 MG tablet Take 1 tablet (40 mg total) by mouth daily. 90 tablet 3  . amLODipine (NORVASC) 5 MG tablet Take 1 tablet (5 mg total) by mouth daily. 90 tablet 3   No current facility-administered medications for this visit.     Also she is married has 3 children 5 grandchildren. She is an ex-smoker. She does not routinely exercise. There is no alcohol use.  ROS General: Negative; No fevers, chills, or night sweats; positive for purposeful weight loss HEENT: Negative; No changes in vision or hearing, sinus congestion, difficulty swallowing Pulmonary: Negative; No cough, wheezing, shortness of breath, hemoptysis Cardiovascular: Negative; No chest pain, presyncope, syncope, palpitations GI: Negative; No nausea, vomiting, diarrhea, or abdominal pain GU: Negative; No dysuria, hematuria, or difficulty voiding Musculoskeletal: Negative; no myalgias, joint pain, or weakness Hematologic/Oncology: Negative; no easy bruising, bleeding Endocrine: Positive for insulin resistance; no heat/cold intolerance; no diabetes Neuro:  Negative; no changes in balance, headaches Skin: Negative; No rashes or skin lesions Psychiatric: Negative; No behavioral problems, depression Sleep: Negative; No snoring, daytime sleepiness, hypersomnolence, bruxism, restless legs, hypnogognic hallucinations, no cataplexy Other comprehensive 14 point system review is negative.   PE BP 136/73   Pulse 73   Ht '5\' 4"'  (1.626 m)   Wt 203 lb 9.6 oz (92.4 kg)   BMI 34.95 kg/m    Repeat blood pressure was 145/80  Wt Readings from Last 3 Encounters:  12/04/16 203 lb 9.6 oz (92.4 kg)  11/05/16 200 lb (90.7 kg)  10/15/16 200 lb (90.7 kg)   General: Alert, oriented, no distress.  Skin: normal turgor, no rashes, warm and dry HEENT: Normocephalic, atraumatic. Pupils equal round and reactive to light; sclera anicteric; extraocular muscles intact;  Nose without nasal septal hypertrophy Mouth/Parynx benign; Mallinpatti scale 3 Neck: No JVD, no carotid bruits; normal carotid upstroke Lungs: clear to ausculatation and percussion; no wheezing or rales Chest wall: without tenderness to palpitation Heart: PMI not displaced, RRR, s1 s2 normal, 1/6 systolic murmur, no diastolic murmur, no rubs, gallops, thrills, or heaves Abdomen: soft, nontender; no hepatosplenomehaly, BS+; abdominal aorta nontender and  not dilated by palpation. Back: no CVA tenderness Pulses 2+ Musculoskeletal: full range of motion, normal strength, no joint deformities Extremities: no clubbing cyanosis or edema, Homan's sign negative  Neurologic: grossly nonfocal; Cranial nerves grossly wnl Psychologic: Normal mood and affect   ECG (independently read by me): Normal sinus rhythm at 73 bpm.  Q wave in 3 and very small Q wave in aVF.  Nonspecific T changes anteriorly.  Normal intervals.  October 2017 ECG (independently read by me): Normal sinus rhythm at 65 bpm.  Nonspecific T changes.  Q wave in lead 3.  October 2016 ECG (independently read by me): Normal sinus rhythm at 77  bpm.  Nonspecific T changes anteriorly which are old.  Normal intervals.  September 2015 ECG (independently read by me): Normal sinus rhythm at 70 beats per minute.  Poor progression anteriorly.  Small Q-wave in lead 3.  Nonspecific T changes anteriorly, unchanged  04/16/2013 ECG (independently read by me): Normal sinus rhythm at 65 beats per minute. T wave changes V1 through V6, nonspecific, unchanged. QTc interval 455 ms  Prior ECG of October 15 2012: NSR @ 62 bpm with T-wave abnormality anteriorly V1 through V5 unchanged.  LABS: BMP Latest Ref Rng & Units 07/02/2016 06/26/2016 09/11/2015  Glucose 65 - 99 mg/dL 133(H) 103(H) 96  BUN 7 - 25 mg/dL '10 13 12  ' Creatinine 0.50 - 0.99 mg/dL 0.81 0.86 0.83  Sodium 135 - 146 mmol/L 141 143 142  Potassium 3.5 - 5.3 mmol/L 4.6 5.5(H) 5.0  Chloride 98 - 110 mmol/L 106 106 108  CO2 20 - 31 mmol/L '23 29 28  ' Calcium 8.6 - 10.4 mg/dL 9.3 9.4 9.4   Hepatic Function Latest Ref Rng & Units 06/26/2016 09/11/2015 10/12/2013  Total Protein 6.1 - 8.1 g/dL 6.8 6.3 6.5  Albumin 3.6 - 5.1 g/dL 4.1 4.1 4.2  AST 10 - 35 U/L '20 18 24  ' ALT 6 - 29 U/L 30(H) 22 29  Alk Phosphatase 33 - 130 U/L 65 62 74  Total Bilirubin 0.2 - 1.2 mg/dL 0.4 0.4 0.3   CBC Latest Ref Rng & Units 06/26/2016 09/11/2015 10/12/2013  WBC 3.8 - 10.8 K/uL 8.4 8.1 7.8  Hemoglobin 11.7 - 15.5 g/dL 15.1 14.4 14.2  Hematocrit 35.0 - 45.0 % 46.0(H) 43.7 42.2  Platelets 140 - 400 K/uL 214 191 204   Lab Results  Component Value Date   MCV 90.2 06/26/2016   MCV 87.8 09/11/2015   MCV 85.1 10/12/2013   Lab Results  Component Value Date   TSH 4.13 06/26/2016   Lab Results  Component Value Date   HGBA1C 5.7 (H) 07/22/2012   Lipid Panel     Component Value Date/Time   CHOL 123 06/26/2016 0859   CHOL 114 07/22/2012 0840   TRIG 125 06/26/2016 0859   TRIG 170 (H) 07/22/2012 0840   HDL 39 (L) 06/26/2016 0859   HDL 42 07/22/2012 0840   CHOLHDL 3.2 06/26/2016 0859   VLDL 25 06/26/2016 0859    LDLCALC 59 06/26/2016 0859   LDLCALC 38 07/22/2012 0840      RADIOLOGY: No results found.  IMPRESSION:  1. Coronary artery disease due to lipid rich plaque   2. Essential hypertension   3. Dyslipidemia   4. Medication management   5. Old MI (myocardial infarction)   6. Tobacco abuse   7. History of lung cancer   8. Moderate obesity     ASSESSMENT AND PLAN: Ms. Valera is a 71 year old Caucasian female who  is 21 years following her initial anterior wall myocardial infarction with VF arrest for which she underwent PTCA emergently of her LAD in 1997. She is 6 years following emergency CABG surgery for life-threatening anatomy with 99% left main disease and total occlusion of her RCA at the ostium with left to right collaterals. Her ejection fraction did improve and is now 50-55% with small area of subtle LAD scar.  She underwent a follow-up myocardial perfusion study on 11/05/2016, which continue to show normal perfusion without scar or ischemia .  He has been without anginal symptoms and has been on Toprol-XL 100 mg daily.  Her blood pressure was elevated when she saw Dr. Roxan Hockey recently and her blood pressure is elevated based on new criteria today.  She was found to have an isolated lab demonstrating hyperkalemia in follow-up laboratory in May showed normal potassium.  Since she's not had recent blood work, I am electing to initiate therapy with amlodipine 5 mg daily which should not have any potentially adverse effects on her serum potassium level rather than initially starting ARB therapy.   At the time of her CABG, she subsequently was found to have early stage IA non-small cell carcinoma for which he underwent left upper lobe lobectomy and did not require adjuvant chemotherapy.  Follow-up chest CTs have remained stable.  She continues to have yearly follow-ups with Dr. Roxan Hockey and needs ongoing CT screening with low-dose CT.  Unfortunately, she continues to smoke cigarettes.  I  had a long discussion again with her regarding this.  I offered the possibility of Chantix or other modalities.  I discussed the absolute importance that she quit tobacco.  She has uses this to alleviate stress and I've suggested that instead she try exercising.  She is obese with a BMI of 34.95.  She will undergo fasting repeat laboratory.  I will see her in 3 months for reevaluation.  Time spent: 25 minutes  Troy Sine, MD, [facility]  12/04/2016 9:42 AM

## 2016-12-04 NOTE — Patient Instructions (Signed)
Medication Instructions:  START amlodipine (Norvasc) 5 mg (1 tablet) daily  Labwork: Please return for FASTING labs (CMET, CBC, Lipid, TSH)  Our in office lab hours are Monday-Friday 8:00-4:30, closed for lunch 1-2 pm.  No appointment needed.  Follow-Up: Your physician recommends that you schedule a follow-up appointment in: 3 months with Dr. Claiborne Billings.   Any Other Special Instructions Will Be Listed Below (If Applicable).     If you need a refill on your cardiac medications before your next appointment, please call your pharmacy.

## 2016-12-06 ENCOUNTER — Other Ambulatory Visit: Payer: Self-pay

## 2016-12-06 DIAGNOSIS — Z79899 Other long term (current) drug therapy: Secondary | ICD-10-CM | POA: Diagnosis not present

## 2016-12-06 DIAGNOSIS — I2510 Atherosclerotic heart disease of native coronary artery without angina pectoris: Secondary | ICD-10-CM | POA: Diagnosis not present

## 2016-12-06 DIAGNOSIS — I2583 Coronary atherosclerosis due to lipid rich plaque: Secondary | ICD-10-CM | POA: Diagnosis not present

## 2016-12-06 DIAGNOSIS — E875 Hyperkalemia: Secondary | ICD-10-CM

## 2016-12-06 DIAGNOSIS — I10 Essential (primary) hypertension: Secondary | ICD-10-CM | POA: Diagnosis not present

## 2016-12-06 DIAGNOSIS — E785 Hyperlipidemia, unspecified: Secondary | ICD-10-CM | POA: Diagnosis not present

## 2016-12-06 LAB — COMPREHENSIVE METABOLIC PANEL
A/G RATIO: 1.7 (ref 1.2–2.2)
ALT: 35 IU/L — AB (ref 0–32)
AST: 27 IU/L (ref 0–40)
Albumin: 4.3 g/dL (ref 3.5–4.8)
Alkaline Phosphatase: 79 IU/L (ref 39–117)
BUN/Creatinine Ratio: 15 (ref 12–28)
BUN: 10 mg/dL (ref 8–27)
Bilirubin Total: 0.4 mg/dL (ref 0.0–1.2)
CALCIUM: 10 mg/dL (ref 8.7–10.3)
CO2: 26 mmol/L (ref 20–29)
Chloride: 105 mmol/L (ref 96–106)
Creatinine, Ser: 0.66 mg/dL (ref 0.57–1.00)
GFR calc Af Amer: 104 mL/min/{1.73_m2} (ref >59)
GFR calc non Af Amer: 90 mL/min/{1.73_m2} (ref >59)
Globulin, Total: 2.5 g/dL (ref 1.5–4.5)
Glucose: 93 mg/dL (ref 65–99)
POTASSIUM: 5.9 mmol/L — AB (ref 3.5–5.2)
SODIUM: 146 mmol/L — AB (ref 134–144)
Total Protein: 6.8 g/dL (ref 6.0–8.5)

## 2016-12-06 LAB — LIPID PANEL
Chol/HDL Ratio: 2.9 ratio (ref 0.0–4.4)
Cholesterol, Total: 114 mg/dL (ref 100–199)
HDL: 39 mg/dL — ABNORMAL LOW (ref >39)
LDL Calculated: 48 mg/dL (ref 0–99)
Triglycerides: 135 mg/dL (ref 0–149)
VLDL CHOLESTEROL CAL: 27 mg/dL (ref 5–40)

## 2016-12-06 LAB — CBC
Hematocrit: 44 % (ref 34.0–46.6)
Hemoglobin: 14.6 g/dL (ref 11.1–15.9)
MCH: 29 pg (ref 26.6–33.0)
MCHC: 33.2 g/dL (ref 31.5–35.7)
MCV: 88 fL (ref 79–97)
PLATELETS: 193 10*3/uL (ref 150–379)
RBC: 5.03 x10E6/uL (ref 3.77–5.28)
RDW: 14.3 % (ref 12.3–15.4)
WBC: 7.9 10*3/uL (ref 3.4–10.8)

## 2016-12-06 LAB — TSH: TSH: 3.33 u[IU]/mL (ref 0.450–4.500)

## 2016-12-10 DIAGNOSIS — E875 Hyperkalemia: Secondary | ICD-10-CM | POA: Diagnosis not present

## 2016-12-10 LAB — BASIC METABOLIC PANEL
BUN/Creatinine Ratio: 15 (ref 12–28)
BUN: 12 mg/dL (ref 8–27)
CALCIUM: 10 mg/dL (ref 8.7–10.3)
CO2: 24 mmol/L (ref 20–29)
CREATININE: 0.78 mg/dL (ref 0.57–1.00)
Chloride: 104 mmol/L (ref 96–106)
GFR, EST AFRICAN AMERICAN: 89 mL/min/{1.73_m2} (ref >59)
GFR, EST NON AFRICAN AMERICAN: 77 mL/min/{1.73_m2} (ref >59)
Glucose: 118 mg/dL — ABNORMAL HIGH (ref 65–99)
Potassium: 5.1 mmol/L (ref 3.5–5.2)
Sodium: 144 mmol/L (ref 134–144)

## 2017-01-16 ENCOUNTER — Encounter: Payer: Self-pay | Admitting: Family Medicine

## 2017-01-16 ENCOUNTER — Ambulatory Visit (INDEPENDENT_AMBULATORY_CARE_PROVIDER_SITE_OTHER): Payer: Medicare Other | Admitting: Family Medicine

## 2017-01-16 VITALS — BP 130/79 | HR 76 | Ht 64.0 in | Wt 203.0 lb

## 2017-01-16 DIAGNOSIS — I2583 Coronary atherosclerosis due to lipid rich plaque: Secondary | ICD-10-CM

## 2017-01-16 DIAGNOSIS — E039 Hypothyroidism, unspecified: Secondary | ICD-10-CM | POA: Diagnosis not present

## 2017-01-16 DIAGNOSIS — E785 Hyperlipidemia, unspecified: Secondary | ICD-10-CM

## 2017-01-16 DIAGNOSIS — Z806 Family history of leukemia: Secondary | ICD-10-CM

## 2017-01-16 DIAGNOSIS — Z720 Tobacco use: Secondary | ICD-10-CM

## 2017-01-16 DIAGNOSIS — Z716 Tobacco abuse counseling: Secondary | ICD-10-CM | POA: Diagnosis not present

## 2017-01-16 DIAGNOSIS — I2510 Atherosclerotic heart disease of native coronary artery without angina pectoris: Secondary | ICD-10-CM | POA: Diagnosis not present

## 2017-01-16 DIAGNOSIS — C3492 Malignant neoplasm of unspecified part of left bronchus or lung: Secondary | ICD-10-CM

## 2017-01-16 DIAGNOSIS — C3412 Malignant neoplasm of upper lobe, left bronchus or lung: Secondary | ICD-10-CM | POA: Diagnosis not present

## 2017-01-16 DIAGNOSIS — I219 Acute myocardial infarction, unspecified: Secondary | ICD-10-CM | POA: Diagnosis not present

## 2017-01-16 DIAGNOSIS — I10 Essential (primary) hypertension: Secondary | ICD-10-CM | POA: Diagnosis not present

## 2017-01-16 NOTE — Patient Instructions (Addendum)
CBC, CMP, TSH, T4, A1c, FLP, Vit D upon return in 4 mo, then meet with me 1-2 wks later to go over labs, reck BP  Please realize, EXERCISE IS MEDICINE!  -  American Heart Association [facility]) guidelines for exercise : If you are in good health, without any medical conditions, you should engage in 150 minutes of moderate intensity aerobic activity per week.  This means you should be huffing and puffing throughout your workout.   Engaging in regular exercise will improve brain function and memory, as well as improve mood, boost immune system and help with weight management.  As well as the other, more well-known effects of exercise such as decreasing blood sugar levels, decreasing blood pressure,  and decreasing bad cholesterol levels/ increasing good cholesterol levels.     -  The AHA strongly endorses consumption of a diet that contains a variety of foods from all the food categories with an emphasis on fruits and vegetables; fat-free and low-fat dairy products; cereal and grain products; legumes and nuts; and fish, poultry, and/or extra lean meats.    Excessive food intake, especially of foods high in saturated and trans fats, sugar, and salt, should be avoided.    Adequate water intake of roughly 1/2 of your weight in pounds, should equal the ounces of water per day you should drink.  So for instance, if you're 200 pounds, that would be 100 ounces of water per day.         Mediterranean Diet  Why follow it? Research shows. . Those who follow the Mediterranean diet have a reduced risk of heart disease  . The diet is associated with a reduced incidence of Parkinson's and Alzheimer's diseases . People following the diet may have longer life expectancies and lower rates of chronic diseases  . The Dietary Guidelines for Americans recommends the Mediterranean diet as an eating plan to promote health and prevent disease  What Is the Mediterranean Diet?  . Healthy eating plan based on typical foods and  recipes of Mediterranean-style cooking . The diet is primarily a plant based diet; these foods should make up a majority of meals   Starches - Plant based foods should make up a majority of meals - They are an important sources of vitamins, minerals, energy, antioxidants, and fiber - Choose whole grains, foods high in fiber and minimally processed items  - Typical grain sources include wheat, oats, barley, corn, brown rice, bulgar, farro, millet, polenta, couscous  - Various types of beans include chickpeas, lentils, fava beans, black beans, white beans   Fruits  Veggies - Large quantities of antioxidant rich fruits & veggies; 6 or more servings  - Vegetables can be eaten raw or lightly drizzled with oil and cooked  - Vegetables common to the traditional Mediterranean Diet include: artichokes, arugula, beets, broccoli, brussel sprouts, cabbage, carrots, celery, collard greens, cucumbers, eggplant, kale, leeks, lemons, lettuce, mushrooms, okra, onions, peas, peppers, potatoes, pumpkin, radishes, rutabaga, shallots, spinach, sweet potatoes, turnips, zucchini - Fruits common to the Mediterranean Diet include: apples, apricots, avocados, cherries, clementines, dates, figs, grapefruits, grapes, melons, nectarines, oranges, peaches, pears, pomegranates, strawberries, tangerines  Fats - Replace butter and margarine with healthy oils, such as olive oil, canola oil, and tahini  - Limit nuts to no more than a handful a day  - Nuts include walnuts, almonds, pecans, pistachios, pine nuts  - Limit or avoid candied, honey roasted or heavily salted nuts - Olives are central to the Mediterranean diet -  can be eaten whole or used in a variety of dishes   Meats Protein - Limiting red meat: no more than a few times a month - When eating red meat: choose lean cuts and keep the portion to the size of deck of cards - Eggs: approx. 0 to 4 times a week  - Fish and lean poultry: at least 2 a week  - Healthy protein  sources include, chicken, [place], lean beef, lamb - Increase intake of seafood such as tuna, salmon, trout, mackerel, shrimp, scallops - Avoid or limit high fat processed meats such as sausage and bacon  Dairy - Include moderate amounts of low fat dairy products  - Focus on healthy dairy such as fat free yogurt, skim milk, low or reduced fat cheese - Limit dairy products higher in fat such as whole or 2% milk, cheese, ice cream  Alcohol - Moderate amounts of red wine is ok  - No more than 5 oz daily for women (all ages) and men older than age 75  - No more than 10 oz of wine daily for men younger than 86  Other - Limit sweets and other desserts  - Use herbs and spices instead of salt to flavor foods  - Herbs and spices common to the traditional Mediterranean Diet include: basil, bay leaves, chives, cloves, cumin, fennel, garlic, lavender, marjoram, mint, oregano, parsley, pepper, rosemary, sage, savory, sumac, tarragon, thyme   It's not just a diet, it's a lifestyle:  . The Mediterranean diet includes lifestyle factors typical of those in the region  . Foods, drinks and meals are best eaten with others and savored . Daily physical activity is important for overall good health . This could be strenuous exercise like running and aerobics . This could also be more leisurely activities such as walking, housework, yard-work, or taking the stairs . Moderation is the key; a balanced and healthy diet accommodates most foods and drinks . Consider portion sizes and frequency of consumption of certain foods   Meal Ideas & Options:  . Breakfast:  o Whole wheat toast or whole wheat English muffins with peanut butter & hard boiled egg o Steel cut oats topped with apples & cinnamon and skim milk  o Fresh fruit: banana, strawberries, melon, berries, peaches  o Smoothies: strawberries, bananas, greek yogurt, peanut butter o Low fat greek yogurt with blueberries and granola  o Egg white omelet with  spinach and mushrooms o Breakfast couscous: whole wheat couscous, apricots, skim milk, cranberries  . Sandwiches:  o Hummus and grilled vegetables (peppers, zucchini, squash) on whole wheat bread   o Grilled chicken on whole wheat pita with lettuce, tomatoes, cucumbers or tzatziki  o Tuna salad on whole wheat bread: tuna salad made with greek yogurt, olives, red peppers, capers, green onions o Garlic rosemary lamb pita: lamb sauted with garlic, rosemary, salt & pepper; add lettuce, cucumber, greek yogurt to pita - flavor with lemon juice and black pepper  . Seafood:  o Mediterranean grilled salmon, seasoned with garlic, basil, parsley, lemon juice and black pepper o Shrimp, lemon, and spinach whole-grain pasta salad made with low fat greek yogurt  o Seared scallops with lemon orzo  o Seared tuna steaks seasoned salt, pepper, coriander topped with tomato mixture of olives, tomatoes, olive oil, minced garlic, parsley, green onions and cappers  . Meats:  o Herbed greek chicken salad with kalamata olives, cucumber, feta  o Red bell peppers stuffed with spinach, bulgur, lean ground beef (or lentils) &  topped with feta   o Kebabs: skewers of chicken, tomatoes, onions, zucchini, squash  o [place] burgers: made with red onions, mint, dill, lemon juice, feta cheese topped with roasted red peppers . Vegetarian o Cucumber salad: cucumbers, artichoke hearts, celery, red onion, feta cheese, tossed in olive oil & lemon juice  o Hummus and whole grain pita points with a greek salad (lettuce, tomato, feta, olives, cucumbers, red onion) o Lentil soup with celery, carrots made with vegetable broth, garlic, salt and pepper  o Tabouli salad: parsley, bulgur, mint, scallions, cucumbers, tomato, radishes, lemon juice, olive oil, salt and pepper.

## 2017-01-16 NOTE — Progress Notes (Signed)
New patient office visit note:  Impression and Recommendations:    1. Essential hypertension   2. Myocardial infarction, unspecified MI type, unspecified artery ([place])   3. Malignant neoplasm of upper lobe of left lung ([place])   4. Tobacco abuse   5. Dyslipidemia   6. Non-small cell cancer of left lung ([place])   7. Tobacco abuse counseling   8. Acquired hypothyroidism   9. Family history of leukemia     1) patient blood pressure controlled today. Lifestyle changes such as dash diet and engaging in a regular exercise program discussed with patient.  Educational handouts provided  Ambulatory BP monitoring encouraged. Keep log and bring in next OV  Continue current medication(s).   Also, risks and benefits of medications discussed with patient, including alternative treatments.   Encouraged patient to read drug information handouts to further educate self about the medicine prior to starting it.    2/3)  follow-up with cardiology, PULM and ONC and other specialists for her regular surveillance.     4.  Told pt to think seriously about quitting smoking!  Told pt it is very important for his/her health and well being.  Smoking cessation instruction/counseling given:  counseled patient on the dangers of tobacco use, advised patient to stop smoking, and reviewed strategies to maximize success ssed with patient that there are multiple treatments to aid in quitting smoking, however I explained none will work unless pt really want to quit Told to call 1-800-QUIT-NOW (859)330-5051) for free smoking cessation counseling and support, or pt can go online to www.heart.org - the American Heart Association website and search "quit smoking ".   5. Dietary changes such as low saturated & trans fat and low carb/ ketogenic diets discussed with patient.  Encouraged regular exercise and weight loss when appropriate.   Educational handouts provided at patient's desire.  8. Cont meds, will   monitor  Contact us prior with any Q's/ concerns.   Education and routine counseling performed. Handouts provided.  Gross side effects, risk and benefits, and alternatives of medications discussed with patient.  Patient is aware that all medications have potential side effects and we are unable to predict every side effect or drug-drug interaction that may occur.  Expresses verbal understanding and consents to current therapy plan and treatment regimen.  Return for 35mo- come for FBW, then OV with me 1-2 wk later. .  Please see AVS handed out to patient at the end of our visit for further patient instructions/ counseling done pertaining to today's office visit.    Note: This document was prepared using Dragon voice recognition software and may include unintentional dictation errors.  ----------------------------------------------------------------------------------------------------------------------    Subjective:    Chief complaint:   Chief Complaint  Patient presents with  . Establish Care    HPI: [name] is a pleasant 71 y.o. female who presents to [facility] today to review their medical history with me and establish care.   I asked the patient to review their chronic problem list with me to ensure everything was updated and accurate.    All recent office visits with other providers, any medical records that patient brought in etc  - I reviewed today.     Also asked pt to get me medical records from [facility] providers/ specialists that they had seen within the past 3-5 years- if they are in private practice and/or do not work for a [employer], [facility], [facility],  [facility] or [employer] owned Network engineer.  Told them to call their specialists to clarify this if they are not sure.   No pcp many years.  Has been seeing Dr. Claiborne Billings her cardiologist for med management of her blood pressure, cholesterol, thyroid etc.  Has not ever seen a GYN in at least a decade  or 2.  Colonoscopy Dr. Rivka Safer- 2012  No mammogram many yrs or pap- pt never desired to get one even though    Wt Readings from Last 3 Encounters:  03/20/17 205 lb (93 kg)  01/16/17 203 lb (92.1 kg)  12/04/16 203 lb 9.6 oz (92.4 kg)   BP Readings from Last 3 Encounters:  03/20/17 (!) 142/68  01/16/17 130/79  12/04/16 136/73   Pulse Readings from Last 3 Encounters:  03/20/17 76  01/16/17 76  12/04/16 73   BMI Readings from Last 3 Encounters:  03/20/17 35.19 kg/m  01/16/17 34.84 kg/m  12/04/16 34.95 kg/m    Patient Care Team    Relationship Specialty Notifications Start End  Mellody Dance, DO PCP - General Family Medicine  01/16/17   Melrose Nakayama, MD  Cardiothoracic Surgery  12/31/10   Troy Sine, MD  Cardiology  12/31/10     Patient Active Problem List   Diagnosis Date Noted  . Myocardial infarct ([place]) 01/16/2017    Priority: High  . HTN (hypertension)     Priority: High  . Dyslipidemia     Priority: High  . Lung cancer, upper lobe ([place]) 07/09/2011    Priority: Medium  . GERD (gastroesophageal reflux disease)     Priority: Medium  . Tobacco abuse counseling 01/16/2017  . Acquired hypothyroidism 01/16/2017  . Family history of leukemia 01/16/2017  . Palpitations 07/21/2012  . CAD (coronary artery disease)   . Tobacco abuse   . Non-small cell lung cancer ([place]) 08/19/2010     Past Medical History:  Diagnosis Date  . CAD (coronary artery disease)   . Cancer ([place])   . Dyslipidemia   . GERD (gastroesophageal reflux disease)   . HTN (hypertension)   . Myocardial infarct ([place])   . Non-small cell lung cancer ([place]) 08/2010   Stage IA, status post left upper lobectomy July 2012  . Tobacco abuse      Past Medical History:  Diagnosis Date  . CAD (coronary artery disease)   . Cancer ([place])   . Dyslipidemia   . GERD (gastroesophageal reflux disease)   . HTN (hypertension)   . Myocardial infarct ([place])   . Non-small cell lung cancer  ([place]) 08/2010   Stage IA, status post left upper lobectomy July 2012  . Tobacco abuse      Past Surgical History:  Procedure Laterality Date  . CARDIAC CATHETERIZATION  07/06/2010   see CABG report - pt sent to OR  . CARDIOVASCULAR STRESS TEST  09/11/2010   R/S MV - normal perfusion in all regions, EF 46%, no scintigraphic evidence of inducible myocardial ischemia; global LV systolic function mildly reduced; no significant wall motion abnormalities noted; Exercise capacity 7 METS; EKG negative for ischemia; low risk study, no signifcant change from previous study 08/2003  . CORONARY ARTERY BYPASS GRAFT  07/11/2010   LIMA to LAD; SVG to 2nd branch OM; SVG to posterior descending artery  . LEFT VATS  09/17/2010  . TEE WITHOUT CARDIOVERSION  07/06/2010   during emergent CABG surgery; 2-3+ mitral regurgitation     Family History  Problem Relation Age of Onset  . Hypothyroidism  Father   . Heart attack Father   . Aplastic anemia Maternal Grandfather   . Stroke Paternal Grandfather      Social History   Substance and Sexual Activity  Drug Use No     Social History   Substance and Sexual Activity  Alcohol Use No  . Alcohol/week: 0.0 oz     Social History   Tobacco Use  Smoking Status Current Every Day Smoker  . Packs/day: 0.50  . Years: 45.00  . Pack years: 22.50  . Types: Cigarettes  Smokeless Tobacco Never Used  Tobacco Comment   on and off since lobectomy     Outpatient Encounter Medications as of 01/16/2017  Medication Sig  . amLODipine (NORVASC) 5 MG tablet Take 1 tablet (5 mg total) by mouth daily.  Marland Kitchen aspirin 81 MG tablet Take 81 mg by mouth daily.  . Coenzyme Q10 (EQL COQ10) 300 MG CAPS Take by mouth.  . Omega-3 Fatty Acids (FISH OIL) 1000 MG CAPS Take 2,000 mg by mouth daily.  . rosuvastatin (CRESTOR) 40 MG tablet Take 1 tablet (40 mg total) by mouth daily.  . [DISCONTINUED] levothyroxine (SYNTHROID) 50 MCG tablet Take 1 tablet (50 mcg total) by mouth daily  before breakfast.  . [DISCONTINUED] metoprolol succinate (TOPROL-XL) 100 MG 24 hr tablet TAKE 1 TABLET (100 MG TOTAL) BY MOUTH DAILY. TAKE WITH OR IMMEDIATELY FOLLOWING A MEAL.   No facility-administered encounter medications on file as of 01/16/2017.     Allergies: Patient has no known allergies.   Review of Systems  Constitutional: Negative for chills, diaphoresis, fever, malaise/fatigue and weight loss.  HENT: Negative for congestion, sore throat and tinnitus.   Eyes: Negative for blurred vision, double vision and photophobia.  Respiratory: Negative for cough and wheezing.   Cardiovascular: Negative for chest pain and palpitations.  Gastrointestinal: Negative for blood in stool, diarrhea, nausea and vomiting.  Genitourinary: Negative for dysuria, frequency and urgency.  Musculoskeletal: Negative for joint pain and myalgias.  Skin: Negative for itching and rash.  Neurological: Negative for dizziness, focal weakness, weakness and headaches.  Endo/Heme/Allergies: Negative for environmental allergies and polydipsia. Does not bruise/bleed easily.  Psychiatric/Behavioral: Negative for depression and memory loss. The patient is not nervous/anxious and does not have insomnia.      Objective:   Blood pressure 130/79, pulse 76, height 5\' 4"  (1.626 m), weight 203 lb (92.1 kg). Body mass index is 34.84 kg/m. General: Well Developed, well nourished, and in no acute distress.  Neuro: Alert and oriented x3, extra-ocular muscles intact, sensation grossly intact.  HEENT:[place]/AT, PERRLA, neck supple, No carotid bruits Skin: no gross rashes  Cardiac: Regular rate and rhythm Respiratory: Essentially clear to auscultation bilaterally. Not using accessory muscles, speaking in full sentences.  Abdominal: not grossly distended Musculoskeletal: Ambulates w/o diff, FROM * 4 ext.  Vasc: less 2 sec cap RF, warm and pink  Psych:  No HI/SI, judgement and insight good, Euthymic mood. Full  Affect.    Recent Results (from the past 2160 hour(s))  TSH     Status: Abnormal   Collection Time: 04/29/17  8:42 AM  Result Value Ref Range   TSH 5.900 (H) 0.450 - 4.500 uIU/mL  Lipid panel     Status: None   Collection Time: 04/29/17  8:42 AM  Result Value Ref Range   Cholesterol, Total 120 100 - 199 mg/dL   Triglycerides 138 0 - 149 mg/dL   HDL 40 >39 mg/dL   VLDL Cholesterol Cal 28 5 - 40  mg/dL   LDL Calculated 52 0 - 99 mg/dL   Chol/HDL Ratio 3.0 0.0 - 4.4 ratio    Comment:                                   T. Chol/HDL Ratio                                             Men  Women                               1/2 Avg.Risk  3.4    3.3                                   Avg.Risk  5.0    4.4                                2X Avg.Risk  9.6    7.1                                3X Avg.Risk 23.4   11.0   Hemoglobin A1c     Status: Abnormal   Collection Time: 04/29/17  8:42 AM  Result Value Ref Range   Hgb A1c MFr Bld 6.0 (H) 4.8 - 5.6 %    Comment:          Prediabetes: 5.7 - 6.4          Diabetes: >6.4          Glycemic control for adults with diabetes: <7.0    Est. average glucose Bld gHb Est-mCnc 126 mg/dL  Comprehensive metabolic panel     Status: Abnormal   Collection Time: 04/29/17  8:42 AM  Result Value Ref Range   Glucose 100 (H) 65 - 99 mg/dL   BUN 12 8 - 27 mg/dL   Creatinine, Ser 0.88 0.57 - 1.00 mg/dL   GFR calc non Af Amer 67 >59 mL/min/1.73   GFR calc Af Amer 77 >59 mL/min/1.73   BUN/Creatinine Ratio 14 12 - 28   Sodium 147 (H) 134 - 144 mmol/L   Potassium 5.6 (H) 3.5 - 5.2 mmol/L   Chloride 107 (H) 96 - 106 mmol/L   CO2 25 20 - 29 mmol/L   Calcium 9.4 8.7 - 10.3 mg/dL   Total Protein 6.7 6.0 - 8.5 g/dL   Albumin 4.1 3.5 - 4.8 g/dL   Globulin, Total 2.6 1.5 - 4.5 g/dL   Albumin/Globulin Ratio 1.6 1.2 - 2.2   Bilirubin Total 0.3 0.0 - 1.2 mg/dL   Alkaline Phosphatase 73 39 - 117 IU/L   AST 18 0 - 40 IU/L   ALT 24 0 - 32 IU/L  CBC with  Differential/Platelet     Status: Abnormal   Collection Time: 04/29/17  8:42 AM  Result Value Ref Range   WBC 9.9 3.4 - 10.8 x10E3/uL   RBC 5.03 3.77 - 5.28 x10E6/uL   Hemoglobin 14.2 11.1 - 15.9 g/dL   Hematocrit 45.2 34.0 - 46.6 %   MCV 90 79 -  97 fL   MCH 28.2 26.6 - 33.0 pg   MCHC 31.4 (L) 31.5 - 35.7 g/dL   RDW 14.4 12.3 - 15.4 %   Platelets 222 150 - 379 x10E3/uL   Neutrophils 46 Not Estab. %   Lymphs 40 Not Estab. %   Monocytes 11 Not Estab. %   Eos 2 Not Estab. %   Basos 1 Not Estab. %   Neutrophils Absolute 4.6 1.4 - 7.0 x10E3/uL   Lymphocytes Absolute 4.0 (H) 0.7 - 3.1 x10E3/uL   Monocytes Absolute 1.1 (H) 0.1 - 0.9 x10E3/uL   EOS (ABSOLUTE) 0.2 0.0 - 0.4 x10E3/uL   Basophils Absolute 0.1 0.0 - 0.2 x10E3/uL   Immature Granulocytes 0 Not Estab. %   Immature Grans (Abs) 0.0 0.0 - 0.1 x10E3/uL

## 2017-01-25 ENCOUNTER — Other Ambulatory Visit: Payer: Self-pay | Admitting: Cardiovascular Disease

## 2017-03-20 ENCOUNTER — Ambulatory Visit (INDEPENDENT_AMBULATORY_CARE_PROVIDER_SITE_OTHER): Payer: PPO | Admitting: Cardiovascular Disease

## 2017-03-20 ENCOUNTER — Encounter: Payer: Self-pay | Admitting: Cardiovascular Disease

## 2017-03-20 VITALS — BP 142/68 | HR 76 | Ht 64.0 in | Wt 205.0 lb

## 2017-03-20 DIAGNOSIS — E039 Hypothyroidism, unspecified: Secondary | ICD-10-CM

## 2017-03-20 DIAGNOSIS — E785 Hyperlipidemia, unspecified: Secondary | ICD-10-CM

## 2017-03-20 DIAGNOSIS — I2510 Atherosclerotic heart disease of native coronary artery without angina pectoris: Secondary | ICD-10-CM

## 2017-03-20 DIAGNOSIS — Z85118 Personal history of other malignant neoplasm of bronchus and lung: Secondary | ICD-10-CM

## 2017-03-20 DIAGNOSIS — I2583 Coronary atherosclerosis due to lipid rich plaque: Secondary | ICD-10-CM | POA: Diagnosis not present

## 2017-03-20 DIAGNOSIS — E668 Other obesity: Secondary | ICD-10-CM | POA: Diagnosis not present

## 2017-03-20 DIAGNOSIS — Z720 Tobacco use: Secondary | ICD-10-CM

## 2017-03-20 DIAGNOSIS — I10 Essential (primary) hypertension: Secondary | ICD-10-CM | POA: Diagnosis not present

## 2017-03-20 NOTE — Progress Notes (Signed)
Patient ID: [name], female   DOB: Nov 01, 1945, 72 y.o.   MRN: [id number] n       HPI: [name] is a 72 y.o. female who presents to the office today for a 3 month follow up cardiology evaluation.  [name] suffered initial anterior wall myocardial infarction with ventricular fibrillation arrest requiring resuscitation and PTCA of her LAD in 1997. On 07/05/2010 she presented with increasing shortness of breath and was found to have critical life threatening anatomy with 99% left main equivalent disease.   Later that afternoon she underwent emergent CABG revascularization surgery by Dr. Roxan Hockey with a LIMA to the LAD, a vein graft to the second branch of the obtuse marginal vessel, and vein graft to the PDA.  Chest x-rays disclosed a left upper lung nodule and ultimately this proved to be adenocarcinoma for which she ultimately underwent upper lobe resection in July 2012. Additional problems include hypertension, hyperlipidemia, obesity and probable metabolic syndrome. A nuclear perfusion study in July 2012 showed a post stress ejection fraction of 46% but otherwise fairly normal perfusion. EF at cath was 35% prior to surgery.   In December 2013 an NMR lipoprofile revealed that despite an LDL of 50 she had increased LDL particle #1312, and increased small LDL particle number at 761. Her insulin resistance was significantly elevated at 70. HDL particle number was low at 26.8 and a triglyceride of 152.evaluation.  An echo Doppler study on 08/04/2012 showed an ejection fraction at 50 - 55%. She had mild LVH. There was grade 1 diastolic dysfunction with distal anterior, anteroseptal and apical LAD scar. There is mild tricuspid regurgitation and mild mitral regurgitation. PA pressure estimated 32 mm.  A LexiScan study in 07/2012 was low risk. Ejection fraction was 56%. There is no evidence for ischemia. She had laboratory which showed an LDL particle number at 830 with a target LDL of 38  HDL 42 triglycerides 170. Her insulin resistance was elevated at 60. Hemoglobin A1c was 5.7.  When I last saw her in October 2017 she was still smoking one pack of cigarettes every 3 day.  She sees Dr. Roxan Hockey on a yearly basis and  saw him in August 2017..  A follow-up chest CT was reviewed and she was stable without evidence of local recurrence or metastatic disease status post left upper lobectomy for stage IA non-small cell carcinoma.  Over the past year, she denies any episodes of recurrent chest pain or awareness of palpitations.  She underwent a nuclear perfusion study on 11/05/2016, which revealed normal perfusion and function without scar or ischemia. She saw Dr. Roxan Hockey in August 2018 and during that evaluation, she was significantly hypertensive with a blood pressure 170/87.  She was on Toprol-XL 100 mg, rosuvastatin 40 mg, and levothyroxine 50 g.  Unfortunately she still smokes and is smoking anywhere from 1-3/4 of a pack daily.  She had laboratory in May and on one chemistry evaluation.  Her potassium was 5.5, a follow-up evaluation showed normal potassium.  Cholesterol was 123, HDL 39, and LDL 59.    When I last saw her in October 2018, I initiated amlodipine 5 mg.  She was undergoing low dose CT imaging for CA.  We again discussed complete smoking cessation.  Unfortunately, she continues to smoke cigarettes daily.  She denies chest pain.  She denies palpitations.  She denies PND, orthopnea.  She presents for reevaluation.  Past Medical History:  Diagnosis Date  . CAD (coronary artery disease)   .  Cancer ([place])   . Dyslipidemia   . GERD (gastroesophageal reflux disease)   . HTN (hypertension)   . Myocardial infarct ([place])   . Non-small cell lung cancer ([place]) 08/2010   Stage IA, status post left upper lobectomy July 2012  . Tobacco abuse     Past Surgical History:  Procedure Laterality Date  . CARDIAC CATHETERIZATION  07/06/2010   see CABG report - pt sent to OR  .  CARDIOVASCULAR STRESS TEST  09/11/2010   R/S MV - normal perfusion in all regions, EF 46%, no scintigraphic evidence of inducible myocardial ischemia; global LV systolic function mildly reduced; no significant wall motion abnormalities noted; Exercise capacity 7 METS; EKG negative for ischemia; low risk study, no signifcant change from previous study 08/2003  . CORONARY ARTERY BYPASS GRAFT  07/11/2010   LIMA to LAD; SVG to 2nd branch OM; SVG to posterior descending artery  . LEFT VATS  09/17/2010  . TEE WITHOUT CARDIOVERSION  07/06/2010   during emergent CABG surgery; 2-3+ mitral regurgitation    No Known Allergies  Current Outpatient Medications  Medication Sig Dispense Refill  . aspirin 81 MG tablet Take 81 mg by mouth daily.    . Coenzyme Q10 (EQL COQ10) 300 MG CAPS Take by mouth.    . levothyroxine (SYNTHROID) 50 MCG tablet Take 1 tablet (50 mcg total) by mouth daily before breakfast. 90 tablet 3  . metoprolol succinate (TOPROL-XL) 100 MG 24 hr tablet TAKE 1 TABLET (100 MG TOTAL) BY MOUTH DAILY. TAKE WITH OR IMMEDIATELY FOLLOWING A MEAL. 90 tablet 3  . Omega-3 Fatty Acids (FISH OIL) 1000 MG CAPS Take 2,000 mg by mouth daily.    . rosuvastatin (CRESTOR) 40 MG tablet Take 1 tablet (40 mg total) by mouth daily. 90 tablet 3  . amLODipine (NORVASC) 5 MG tablet Take 1 tablet (5 mg total) by mouth daily. 90 tablet 3   No current facility-administered medications for this visit.     Also she is married has 3 children 5 grandchildren. She is an ex-smoker. She does not routinely exercise. There is no alcohol use.  ROS General: Negative; No fevers, chills, or night sweats; positive for purposeful weight loss HEENT: Negative; No changes in vision or hearing, sinus congestion, difficulty swallowing Pulmonary: Negative; No cough, wheezing, shortness of breath, hemoptysis Cardiovascular: Negative; No chest pain, presyncope, syncope, palpitations GI: Negative; No nausea, vomiting, diarrhea, or  abdominal pain GU: Negative; No dysuria, hematuria, or difficulty voiding Musculoskeletal: Negative; no myalgias, joint pain, or weakness Hematologic/Oncology: Negative; no easy bruising, bleeding Endocrine: Positive for insulin resistance; no heat/cold intolerance; no diabetes Neuro: Negative; no changes in balance, headaches Skin: Negative; No rashes or skin lesions Psychiatric: Negative; No behavioral problems, depression Sleep: Negative; No snoring, daytime sleepiness, hypersomnolence, bruxism, restless legs, hypnogognic hallucinations, no cataplexy Other comprehensive 14 point system review is negative.   PE BP (!) 142/68   Pulse 76   Ht '5\' 4"'  (1.626 m)   Wt 205 lb (93 kg)   BMI 35.19 kg/m    Repeat blood pressure by me was 122/70  Wt Readings from Last 3 Encounters:  03/20/17 205 lb (93 kg)  01/16/17 203 lb (92.1 kg)  12/04/16 203 lb 9.6 oz (92.4 kg)   General: Alert, oriented, no distress.  Skin: normal turgor, no rashes, warm and dry HEENT: Normocephalic, atraumatic. Pupils equal round and reactive to light; sclera anicteric; extraocular muscles intact;  Nose without nasal septal hypertrophy Mouth/Parynx benign; Mallinpatti scale 3 Neck: No  JVD, no carotid bruits; normal carotid upstroke Lungs: clear to ausculatation and percussion; no wheezing or rales Chest wall: without tenderness to palpitation Heart: PMI not displaced, RRR, s1 s2 normal, 1/6 systolic murmur, no diastolic murmur, no rubs, gallops, thrills, or heaves Abdomen: soft, nontender; no hepatosplenomehaly, BS+; abdominal aorta nontender and not dilated by palpation. Back: no CVA tenderness Pulses 2+ Musculoskeletal: full range of motion, normal strength, no joint deformities Extremities: no clubbing cyanosis or edema, Homan's sign negative  Neurologic: grossly nonfocal; Cranial nerves grossly wnl Psychologic: Normal mood and affect   ECG (independently read by me): normal sinus rhythm at 76 bpm.  Q  wave in lead 3.  Nonspecific T changes.  October 2018 ECG (independently read by me): Normal sinus rhythm at 73 bpm.  Q wave in 3 and very small Q wave in aVF.  Nonspecific T changes anteriorly.  Normal intervals.  October 2017 ECG (independently read by me): Normal sinus rhythm at 65 bpm.  Nonspecific T changes.  Q wave in lead 3.  October 2016 ECG (independently read by me): Normal sinus rhythm at 77 bpm.  Nonspecific T changes anteriorly which are old.  Normal intervals.  September 2015 ECG (independently read by me): Normal sinus rhythm at 70 beats per minute.  Poor progression anteriorly.  Small Q-wave in lead 3.  Nonspecific T changes anteriorly, unchanged  04/16/2013 ECG (independently read by me): Normal sinus rhythm at 65 beats per minute. T wave changes V1 through V6, nonspecific, unchanged. QTc interval 455 ms  Prior ECG of October 15 2012: NSR @ 62 bpm with T-wave abnormality anteriorly V1 through V5 unchanged.  LABS: BMP Latest Ref Rng & Units 12/10/2016 12/06/2016 07/02/2016  Glucose 65 - 99 mg/dL 118(H) 93 133(H)  BUN 8 - 27 mg/dL '12 10 10  ' Creatinine 0.57 - 1.00 mg/dL 0.78 0.66 0.81  BUN/Creat Ratio 12 - '28 15 15 ' -  Sodium 134 - 144 mmol/L 144 146(H) 141  Potassium 3.5 - 5.2 mmol/L 5.1 5.9(H) 4.6  Chloride 96 - 106 mmol/L 104 105 106  CO2 20 - 29 mmol/L '24 26 23  ' Calcium 8.7 - 10.3 mg/dL 10.0 10.0 9.3   Hepatic Function Latest Ref Rng & Units 12/06/2016 06/26/2016 09/11/2015  Total Protein 6.0 - 8.5 g/dL 6.8 6.8 6.3  Albumin 3.5 - 4.8 g/dL 4.3 4.1 4.1  AST 0 - 40 IU/L '27 20 18  ' ALT 0 - 32 IU/L 35(H) 30(H) 22  Alk Phosphatase 39 - 117 IU/L 79 65 62  Total Bilirubin 0.0 - 1.2 mg/dL 0.4 0.4 0.4   CBC Latest Ref Rng & Units 12/06/2016 06/26/2016 09/11/2015  WBC 3.4 - 10.8 x10E3/uL 7.9 8.4 8.1  Hemoglobin 11.1 - 15.9 g/dL 14.6 15.1 14.4  Hematocrit 34.0 - 46.6 % 44.0 46.0(H) 43.7  Platelets 150 - 379 x10E3/uL 193 214 191   Lab Results  Component Value Date   MCV 88  12/06/2016   MCV 90.2 06/26/2016   MCV 87.8 09/11/2015   Lab Results  Component Value Date   TSH 3.330 12/06/2016   Lab Results  Component Value Date   HGBA1C 5.7 (H) 07/22/2012   Lipid Panel     Component Value Date/Time   CHOL 114 12/06/2016 0852   CHOL 114 07/22/2012 0840   TRIG 135 12/06/2016 0852   TRIG 170 (H) 07/22/2012 0840   HDL 39 (L) 12/06/2016 0852   HDL 42 07/22/2012 0840   CHOLHDL 2.9 12/06/2016 0852   CHOLHDL 3.2 06/26/2016 0859  VLDL 25 06/26/2016 0859   LDLCALC 48 12/06/2016 0852   LDLCALC 38 07/22/2012 0840      RADIOLOGY: No results found.  IMPRESSION:  1. Coronary artery disease due to lipid rich plaque   2. Essential hypertension   3. Dyslipidemia   4. History of lung cancer   5. Moderate obesity   6. Tobacco abuse   7. Hypothyroidism, unspecified type     ASSESSMENT AND PLAN: [name] is a 72 year old Caucasian female who is 22 years following her initial anterior wall myocardial infarction with VF arrest for which she underwent PTCA emergently of her LAD in 1997. She is almost 7 years following emergency CABG surgery for life-threatening anatomy with 99% left main disease and total occlusion of her RCA at the ostium with left to right collaterals. Her ejection fraction did improve and is now 50-55% with small area of subtle LAD scar.  She underwent a follow-up myocardial perfusion study on 11/05/2016, which continue to show normal perfusion without scar or ischemia .  Marland Kitchen  Currently she is not having any recurrent anginal symptoms on her current regimen.  She is on amlodipine 5 mg, Toprol-XL 100 mg both for blood pressure control as well as anti-ischemic benefit.  She continues to be on aspirin 81 mg daily.  She has hypothyroidism and is on levothyroxine at 50 g.  He continues to be on rosuvastatin 40 mg and fish oil and most recent lipid panel in October 2018 showed an excellent LDL at 48.  I reviewed her most recent laboratory.  Renal function  remained stable.  Her potassium was upper normal.  Reportedly she continues to smoke cigarettes.  I again had a long discussion with her regarding optimal importance of smoking cessation.  However, she has no attention to quit presently.  I discussed increase exercise for at least 30 minutes 5 days per week.  She will have her yearly follow-up with Dr. Roxan Hockey.  I will see her in one year for cardiology reevaluation.  Time spent: 25 minutes  Troy Sine, MD, [facility]  03/22/2017 2:21 PM

## 2017-03-20 NOTE — Patient Instructions (Signed)
Medication Instructions:  Your physician recommends that you continue on your current medications as directed. Please refer to the Current Medication list given to you today.  Follow-Up: Your physician wants you to follow-up in: 12 months with Dr. Claiborne Billings. You will receive a reminder letter in the mail two months in advance. If you don't receive a letter, please call our office to schedule the follow-up appointment.    If you need a refill on your cardiac medications before your next appointment, please call your pharmacy.

## 2017-03-22 ENCOUNTER — Other Ambulatory Visit: Payer: Self-pay | Admitting: Cardiovascular Disease

## 2017-03-22 ENCOUNTER — Encounter: Payer: Self-pay | Admitting: Cardiovascular Disease

## 2017-04-29 ENCOUNTER — Other Ambulatory Visit: Payer: Self-pay

## 2017-04-29 ENCOUNTER — Other Ambulatory Visit (INDEPENDENT_AMBULATORY_CARE_PROVIDER_SITE_OTHER): Payer: PPO

## 2017-04-29 DIAGNOSIS — E039 Hypothyroidism, unspecified: Secondary | ICD-10-CM | POA: Diagnosis not present

## 2017-04-29 DIAGNOSIS — Z8639 Personal history of other endocrine, nutritional and metabolic disease: Secondary | ICD-10-CM | POA: Diagnosis not present

## 2017-04-29 DIAGNOSIS — Z806 Family history of leukemia: Secondary | ICD-10-CM

## 2017-04-29 DIAGNOSIS — Z0000 Encounter for general adult medical examination without abnormal findings: Secondary | ICD-10-CM | POA: Diagnosis not present

## 2017-04-29 DIAGNOSIS — E785 Hyperlipidemia, unspecified: Secondary | ICD-10-CM | POA: Diagnosis not present

## 2017-04-29 DIAGNOSIS — I10 Essential (primary) hypertension: Secondary | ICD-10-CM

## 2017-04-30 LAB — CBC WITH DIFFERENTIAL/PLATELET
BASOS ABS: 0.1 10*3/uL (ref 0.0–0.2)
BASOS: 1 %
EOS (ABSOLUTE): 0.2 10*3/uL (ref 0.0–0.4)
Eos: 2 %
HEMOGLOBIN: 14.2 g/dL (ref 11.1–15.9)
Hematocrit: 45.2 % (ref 34.0–46.6)
IMMATURE GRANS (ABS): 0 10*3/uL (ref 0.0–0.1)
Immature Granulocytes: 0 %
LYMPHS: 40 %
Lymphocytes Absolute: 4 10*3/uL — ABNORMAL HIGH (ref 0.7–3.1)
MCH: 28.2 pg (ref 26.6–33.0)
MCHC: 31.4 g/dL — ABNORMAL LOW (ref 31.5–35.7)
MCV: 90 fL (ref 79–97)
MONOCYTES: 11 %
Monocytes Absolute: 1.1 10*3/uL — ABNORMAL HIGH (ref 0.1–0.9)
Neutrophils Absolute: 4.6 10*3/uL (ref 1.4–7.0)
Neutrophils: 46 %
Platelets: 222 10*3/uL (ref 150–379)
RBC: 5.03 x10E6/uL (ref 3.77–5.28)
RDW: 14.4 % (ref 12.3–15.4)
WBC: 9.9 10*3/uL (ref 3.4–10.8)

## 2017-04-30 LAB — LIPID PANEL
CHOL/HDL RATIO: 3 ratio (ref 0.0–4.4)
Cholesterol, Total: 120 mg/dL (ref 100–199)
HDL: 40 mg/dL (ref >39)
LDL Calculated: 52 mg/dL (ref 0–99)
TRIGLYCERIDES: 138 mg/dL (ref 0–149)
VLDL CHOLESTEROL CAL: 28 mg/dL (ref 5–40)

## 2017-04-30 LAB — HEMOGLOBIN A1C
Est. average glucose Bld gHb Est-mCnc: 126 mg/dL
HEMOGLOBIN A1C: 6 % — AB (ref 4.8–5.6)

## 2017-04-30 LAB — COMPREHENSIVE METABOLIC PANEL
ALT: 24 IU/L (ref 0–32)
AST: 18 IU/L (ref 0–40)
Albumin/Globulin Ratio: 1.6 (ref 1.2–2.2)
Albumin: 4.1 g/dL (ref 3.5–4.8)
Alkaline Phosphatase: 73 IU/L (ref 39–117)
BUN/Creatinine Ratio: 14 (ref 12–28)
BUN: 12 mg/dL (ref 8–27)
Bilirubin Total: 0.3 mg/dL (ref 0.0–1.2)
CALCIUM: 9.4 mg/dL (ref 8.7–10.3)
CHLORIDE: 107 mmol/L — AB (ref 96–106)
CO2: 25 mmol/L (ref 20–29)
Creatinine, Ser: 0.88 mg/dL (ref 0.57–1.00)
GFR calc Af Amer: 77 mL/min/{1.73_m2} (ref >59)
GFR, EST NON AFRICAN AMERICAN: 67 mL/min/{1.73_m2} (ref >59)
GLUCOSE: 100 mg/dL — AB (ref 65–99)
Globulin, Total: 2.6 g/dL (ref 1.5–4.5)
Potassium: 5.6 mmol/L — ABNORMAL HIGH (ref 3.5–5.2)
Sodium: 147 mmol/L — ABNORMAL HIGH (ref 134–144)
Total Protein: 6.7 g/dL (ref 6.0–8.5)

## 2017-04-30 LAB — TSH: TSH: 5.9 u[IU]/mL — ABNORMAL HIGH (ref 0.450–4.500)

## 2017-05-15 ENCOUNTER — Ambulatory Visit (INDEPENDENT_AMBULATORY_CARE_PROVIDER_SITE_OTHER): Payer: PPO | Admitting: Family Medicine

## 2017-05-15 ENCOUNTER — Encounter: Payer: Self-pay | Admitting: Family Medicine

## 2017-05-15 VITALS — BP 133/82 | HR 84 | Ht 64.0 in | Wt 207.0 lb

## 2017-05-15 DIAGNOSIS — I10 Essential (primary) hypertension: Secondary | ICD-10-CM

## 2017-05-15 DIAGNOSIS — E039 Hypothyroidism, unspecified: Secondary | ICD-10-CM

## 2017-05-15 DIAGNOSIS — Z720 Tobacco use: Secondary | ICD-10-CM

## 2017-05-15 DIAGNOSIS — E785 Hyperlipidemia, unspecified: Secondary | ICD-10-CM

## 2017-05-15 DIAGNOSIS — C3412 Malignant neoplasm of upper lobe, left bronchus or lung: Secondary | ICD-10-CM

## 2017-05-15 DIAGNOSIS — R7302 Impaired glucose tolerance (oral): Secondary | ICD-10-CM

## 2017-05-15 DIAGNOSIS — I219 Acute myocardial infarction, unspecified: Secondary | ICD-10-CM

## 2017-05-15 DIAGNOSIS — Z716 Tobacco abuse counseling: Secondary | ICD-10-CM | POA: Diagnosis not present

## 2017-05-15 DIAGNOSIS — K219 Gastro-esophageal reflux disease without esophagitis: Secondary | ICD-10-CM | POA: Diagnosis not present

## 2017-05-15 MED ORDER — LEVOTHYROXINE SODIUM 25 MCG PO TABS: 12.5000 ug | ORAL_TABLET | Freq: Every day | ORAL | 1 refills | Status: DC

## 2017-05-15 NOTE — Progress Notes (Signed)
Assessment and plan:  1. Glucose intolerance (impaired glucose tolerance)   2. Morbidly obese ([place])   3. Myocardial infarction, unspecified MI type, unspecified artery ([place])   4. Hypertension, unspecified type   5. Dyslipidemia   6. Malignant neoplasm of upper lobe of left lung ([place])   7. Gastroesophageal reflux disease, esophagitis presence not specified   8. Tobacco abuse   9. Tobacco abuse counseling   10. Acquired hypothyroidism     1. MI -pt has cardiologist, both Dr. Roxan Hockey and Claiborne Billings who she follows with regularly. Pt asymptomatic at this time.   2. Hypertension -Well controlled in office today, at goal. Pt asymptomatic. Continue meds as listed below per cardiology.  3. Dyslipidemia -no change at this time. Pt sees Dr. Claiborne Billings, cardiology, regularly. -LDL at goal, continue meds per cardiology.   4. Malignant neoplasm of upper lobe of L lung -Pt followed closely by Dr. Roxan Hockey, cardiothoracic surgeon Q every year and gets screening with them yearly.  5. GERD -take zantac OTC.  -Keep a log of foods you eat and avoid foods that exacerbate your symptoms.  -Handouts and information provided.  6. Glucose intolerance -A1c was 6.0 from 04-29-17. This is increased from prior, 4 years ago, where A1c was 5.7.  -Strongly recommend referral to diabetic nutritionist. Pt initially declined. Information and handouts provided.  -Recheck in 3 months.  -Goal: start at the [facility] or other places that allow you to have more social interaction.  7. Morbidly obese -recommend pt to lose weight.  -Ambulatory referral given to diabetic nutritionist. -Walk 5 minutes a day, working your way up to 45 minutes a day slowly over time.   8. Tobacco abuse 9. Tobacco abuse counseling -Recommend pt to stop smoking. -Handouts and information provided.  10. Acquired hypothyroidism -Increase dose of Synthroid to 1.5 tablets  qd. Will monitor levels closely.  -Pt feels she has symptoms of depressed mood that is possibly related to her elevated TSH.  -Recheck 6 weeks.     -Drink adequate amounts of water, equal to half of your weight in oz per day.  -Recommend going out for social activities.  -Pt strongly encouraged to make regular follow ups to manage chronic conditions and overall health.    Education and routine counseling performed. Handouts provided.  Orders Placed This Encounter  Procedures  . TSH+T4F+T3Free  . Amb Referral to Nutrition and Diabetic E    Meds ordered this encounter  Medications  . levothyroxine (SYNTHROID, LEVOTHROID) 25 MCG tablet    Sig: Take 0.5 tablets (12.5 mcg total) by mouth daily. In addition to the 50 mcg    Dispense:  90 tablet    Refill:  1     Return in about 3 months (around 08/15/2017) for Follow-up for prediabetes and A1c check, weight, lifestyle.   Anticipatory guidance and routine counseling done re: condition, txmnt options and need for follow up. All questions of patient's were answered.   Gross side effects, risk and benefits, and alternatives of medications discussed with patient.  Patient is aware that all medications have potential side effects and we are unable to predict every sideeffect or drug-drug interaction that may occur.  Expresses verbal understanding and consents to current therapy plan and treatment regiment.  Please see AVS handed out to patient at the end of our visit for additional patient instructions/ counseling done pertaining to today's office visit.  Note: This document was prepared using Systems analyst and may  include unintentional dictation errors.  Pt was in the office today for 40+ minutes, with over 50% time spent in face to face counseling of patients various medical conditions, treatment plans of those medical conditions including medicine management and lifestyle modification, strategies to improve health  and well being; and in coordination of care. SEE ABOVE FOR DETAILS  This document serves as a record of services personally performed by Mellody Dance, DO. It was created on her behalf by Mayer Masker, a trained medical scribe. The creation of this record is based on the scribe's personal observations and the provider's statements to them.   I have reviewed the above medical documentation for accuracy and completeness and I concur.  Mellody Dance 05/27/17 6:28 PM  ----------------------------------------------------------------------------------------------------------------------  Subjective:   CC:   [name] is a 72 y.o. female who presents to [place] [facility] today for review and discussion of recent bloodwork that was done.  1. All recent blood work that we ordered was reviewed with patient today.  Patient was counseled on all abnormalities and we discussed dietary and lifestyle changes that could help those values (also medications when appropriate).  Extensive health counseling performed and all patient's concerns/ questions were addressed.    Pt has h/o MI and emergent CABG. She was seen by Dr. Claiborne Billings, cardiology, 03-20-17.  Diet  She eats Psychologist, forensic, a handful once per day, sometimes every day. She eats one good meal per day (her husband cooks). She drinks one mountain dew and a small amount of orange juice every day to take with her meds. She does not eat a lot of bread, but states she eats the same thing every week (including chili, spaghetti, hot dogs etc.)  Exercise She states this season she has not been as active as normally and is looking forward to the warmer season so she can be more active. She has silver sneakers.   Elevated TSH Pt states she has had elevated TSH results before, largely related to taking care of her parents. She takes 50 mcg daily. She states she knows her TSH is elevated because she feels more "down and out".    Heart burn She has reflux and was curious about which foods may be causing her symptoms. She will take OTC reflux medications prn, but normally does not take it.   She sees Dr. Roxan Hockey once per year for pulmonology and h/o lung cancer.  Wt Readings from Last 3 Encounters:  05/15/17 207 lb (93.9 kg)  03/20/17 205 lb (93 kg)  01/16/17 203 lb (92.1 kg)   BP Readings from Last 3 Encounters:  05/15/17 133/82  03/20/17 (!) 142/68  01/16/17 130/79   Pulse Readings from Last 3 Encounters:  05/15/17 84  03/20/17 76  01/16/17 76   BMI Readings from Last 3 Encounters:  05/15/17 35.53 kg/m  03/20/17 35.19 kg/m  01/16/17 34.84 kg/m     Patient Care Team    Relationship Specialty Notifications Start End  Mellody Dance, DO PCP - General Family Medicine  01/16/17   Melrose Nakayama, MD  Cardiothoracic Surgery  12/31/10   Troy Sine, MD  Cardiology  12/31/10     Full medical history updated and reviewed in the office today  Patient Active Problem List   Diagnosis Date Noted  . Myocardial infarct ([place]) 01/16/2017    Priority: High  . HTN (hypertension)     Priority: High  . Dyslipidemia     Priority: High  .  Lung cancer, upper lobe ([place]) 07/09/2011    Priority: Medium  . GERD (gastroesophageal reflux disease)     Priority: Medium  . Morbidly obese ([place]) 05/15/2017  . Glucose intolerance (impaired glucose tolerance) 05/15/2017  . Tobacco abuse counseling 01/16/2017  . Acquired hypothyroidism 01/16/2017  . Family history of leukemia 01/16/2017  . Palpitations 07/21/2012  . CAD (coronary artery disease)   . Tobacco abuse   . Non-small cell lung cancer ([place]) 08/19/2010    Past Medical History:  Diagnosis Date  . CAD (coronary artery disease)   . Cancer ([place])   . Dyslipidemia   . GERD (gastroesophageal reflux disease)   . HTN (hypertension)   . Myocardial infarct ([place])   . Non-small cell lung cancer ([place]) 08/2010   Stage IA, status post left upper  lobectomy July 2012  . Tobacco abuse     Past Surgical History:  Procedure Laterality Date  . CARDIAC CATHETERIZATION  07/06/2010   see CABG report - pt sent to OR  . CARDIOVASCULAR STRESS TEST  09/11/2010   R/S MV - normal perfusion in all regions, EF 46%, no scintigraphic evidence of inducible myocardial ischemia; global LV systolic function mildly reduced; no significant wall motion abnormalities noted; Exercise capacity 7 METS; EKG negative for ischemia; low risk study, no signifcant change from previous study 08/2003  . CORONARY ARTERY BYPASS GRAFT  07/11/2010   LIMA to LAD; SVG to 2nd branch OM; SVG to posterior descending artery  . LEFT VATS  09/17/2010  . TEE WITHOUT CARDIOVERSION  07/06/2010   during emergent CABG surgery; 2-3+ mitral regurgitation    Social History   Tobacco Use  . Smoking status: Current Every Day Smoker    Packs/day: 0.50    Years: 45.00    Pack years: 22.50    Types: Cigarettes  . Smokeless tobacco: Never Used  . Tobacco comment: on and off since lobectomy  Substance Use Topics  . Alcohol use: No    Alcohol/week: 0.0 oz    Family Hx: Family History  Problem Relation Age of Onset  . Hypothyroidism Father   . Heart attack Father   . Aplastic anemia Maternal Grandfather   . Stroke Paternal Grandfather      Medications: Current Outpatient Medications  Medication Sig Dispense Refill  . aspirin 81 MG tablet Take 81 mg by mouth daily.    . Coenzyme Q10 (EQL COQ10) 300 MG CAPS Take by mouth.    . levothyroxine (SYNTHROID, LEVOTHROID) 50 MCG tablet TAKE 1 TABLET (50 MCG TOTAL) BY MOUTH DAILY BEFORE BREAKFAST. 90 tablet 3  . metoprolol succinate (TOPROL-XL) 100 MG 24 hr tablet TAKE 1 TABLET (100 MG TOTAL) BY MOUTH DAILY. TAKE WITH OR IMMEDIATELY FOLLOWING A MEAL. 90 tablet 3  . Omega-3 Fatty Acids (FISH OIL) 1000 MG CAPS Take 2,000 mg by mouth daily.    . rosuvastatin (CRESTOR) 40 MG tablet Take 1 tablet (40 mg total) by mouth daily. 90 tablet 3  .  amLODipine (NORVASC) 5 MG tablet Take 1 tablet (5 mg total) by mouth daily. 90 tablet 3  . levothyroxine (SYNTHROID, LEVOTHROID) 25 MCG tablet Take 0.5 tablets (12.5 mcg total) by mouth daily. In addition to the 50 mcg 90 tablet 1   No current facility-administered medications for this visit.     Allergies:  No Known Allergies   Review of Systems: General:   No F/C, wt loss Pulm:   No DIB, SOB, pleuritic chest pain Card:  No CP, palpitations Abd:  No n/v/d or pain Ext:  No inc edema from baseline  Objective:  Blood pressure 133/82, pulse 84, height 5\' 4"  (1.626 m), weight 207 lb (93.9 kg), SpO2 96 %. Body mass index is 35.53 kg/m. Gen:   Well NAD, A and O *3 HEENT:    [place]/AT, EOMI,  MMM Lungs:   Normal work of breathing. CTA B/L, no Wh, rhonchi Heart:   RRR, S1, S2 WNL's, no MRG Abd:   No gross distention Exts:    warm, pink,  Brisk capillary refill, warm and well perfused.  Psych:    No HI/SI, judgement and insight good, Euthymic mood. Full Affect.   Recent Results (from the past 2160 hour(s))  TSH     Status: Abnormal   Collection Time: 04/29/17  8:42 AM  Result Value Ref Range   TSH 5.900 (H) 0.450 - 4.500 uIU/mL  Lipid panel     Status: None   Collection Time: 04/29/17  8:42 AM  Result Value Ref Range   Cholesterol, Total 120 100 - 199 mg/dL   Triglycerides 138 0 - 149 mg/dL   HDL 40 >39 mg/dL   VLDL Cholesterol Cal 28 5 - 40 mg/dL   LDL Calculated 52 0 - 99 mg/dL   Chol/HDL Ratio 3.0 0.0 - 4.4 ratio    Comment:                                   T. Chol/HDL Ratio                                             Men  Women                               1/2 Avg.Risk  3.4    3.3                                   Avg.Risk  5.0    4.4                                2X Avg.Risk  9.6    7.1                                3X Avg.Risk 23.4   11.0   Hemoglobin A1c     Status: Abnormal   Collection Time: 04/29/17  8:42 AM  Result Value Ref Range   Hgb A1c MFr Bld 6.0 (H) 4.8  - 5.6 %    Comment:          Prediabetes: 5.7 - 6.4          Diabetes: >6.4          Glycemic control for adults with diabetes: <7.0    Est. average glucose Bld gHb Est-mCnc 126 mg/dL  Comprehensive metabolic panel     Status: Abnormal   Collection Time: 04/29/17  8:42 AM  Result Value Ref Range   Glucose 100 (H) 65 - 99 mg/dL   BUN 12 8 - 27 mg/dL   Creatinine,  Ser 0.88 0.57 - 1.00 mg/dL   GFR calc non Af Amer 67 >59 mL/min/1.73   GFR calc Af Amer 77 >59 mL/min/1.73   BUN/Creatinine Ratio 14 12 - 28   Sodium 147 (H) 134 - 144 mmol/L   Potassium 5.6 (H) 3.5 - 5.2 mmol/L   Chloride 107 (H) 96 - 106 mmol/L   CO2 25 20 - 29 mmol/L   Calcium 9.4 8.7 - 10.3 mg/dL   Total Protein 6.7 6.0 - 8.5 g/dL   Albumin 4.1 3.5 - 4.8 g/dL   Globulin, Total 2.6 1.5 - 4.5 g/dL   Albumin/Globulin Ratio 1.6 1.2 - 2.2   Bilirubin Total 0.3 0.0 - 1.2 mg/dL   Alkaline Phosphatase 73 39 - 117 IU/L   AST 18 0 - 40 IU/L   ALT 24 0 - 32 IU/L  CBC with Differential/Platelet     Status: Abnormal   Collection Time: 04/29/17  8:42 AM  Result Value Ref Range   WBC 9.9 3.4 - 10.8 x10E3/uL   RBC 5.03 3.77 - 5.28 x10E6/uL   Hemoglobin 14.2 11.1 - 15.9 g/dL   Hematocrit 45.2 34.0 - 46.6 %   MCV 90 79 - 97 fL   MCH 28.2 26.6 - 33.0 pg   MCHC 31.4 (L) 31.5 - 35.7 g/dL   RDW 14.4 12.3 - 15.4 %   Platelets 222 150 - 379 x10E3/uL   Neutrophils 46 Not Estab. %   Lymphs 40 Not Estab. %   Monocytes 11 Not Estab. %   Eos 2 Not Estab. %   Basos 1 Not Estab. %   Neutrophils Absolute 4.6 1.4 - 7.0 x10E3/uL   Lymphocytes Absolute 4.0 (H) 0.7 - 3.1 x10E3/uL   Monocytes Absolute 1.1 (H) 0.1 - 0.9 x10E3/uL   EOS (ABSOLUTE) 0.2 0.0 - 0.4 x10E3/uL   Basophils Absolute 0.1 0.0 - 0.2 x10E3/uL   Immature Granulocytes 0 Not Estab. %   Immature Grans (Abs) 0.0 0.0 - 0.1 x10E3/uL

## 2017-05-15 NOTE — Patient Instructions (Addendum)
Please try to drink at least 100 ounces of water per day.  If you are sweating and outside doing lawn work etc. drink an extra 1 bottle per every half hour of exertion and sweating.  This is important to help your kidneys and your body's electrolytes maintained within normal limits.  Will also help your blood pressure and other aspects of your life  - CMA to give pt tob cess materials as well at discharge  -Patient reminded to come in in 6 weeks for repeat of thyroid labs.

## 2017-05-22 ENCOUNTER — Telehealth: Payer: Self-pay | Admitting: Family Medicine

## 2017-05-22 NOTE — Telephone Encounter (Signed)
Noted MPulliam, CMA/RT(R)  

## 2017-05-22 NOTE — Telephone Encounter (Signed)
Patient listed [facility] [address] as pharmacy that gv Pneumonia vaccine--Per Aaron Edelman at [facility] no record of Patient in data base as giving PNA vaccine-- Cld pt left msg to call office to clarify where rcvd.  --glh

## 2017-06-04 ENCOUNTER — Telehealth: Payer: Self-pay | Admitting: Family Medicine

## 2017-06-04 NOTE — Telephone Encounter (Signed)
Provider trying to obtain record of Pneumonia vaccine patient stated had gvn @ [facility] on [address]---- Pharmacy rep u/a to locate any information in their system pt given vaccine there.  --Called patient to see if different pharmacy or if she remembers where Vaccine received--left msg for patient to call office   --glh

## 2017-06-10 ENCOUNTER — Telehealth: Payer: Self-pay | Admitting: Family Medicine

## 2017-06-10 NOTE — Telephone Encounter (Signed)
Noted MPulliam, CMA/RT(R)  

## 2017-06-10 NOTE — Telephone Encounter (Signed)
Patient called to clarify that she had PNA (pneumonia vaccine in 1997, probably irrelevant at this point whether or not it is still in her system--- Patient also states she had the shingle vaccine in  [place] at the [facility]. At least 4 years ago.  We were trying to track down records of these with no success , so I reached out to patient for assistant.  --forwarding message to medical assistant for record documentation.  --glh

## 2017-06-18 ENCOUNTER — Other Ambulatory Visit (INDEPENDENT_AMBULATORY_CARE_PROVIDER_SITE_OTHER): Payer: PPO

## 2017-06-18 DIAGNOSIS — E039 Hypothyroidism, unspecified: Secondary | ICD-10-CM

## 2017-06-19 LAB — TSH+T4F+T3FREE
FREE T4: 1.26 ng/dL (ref 0.82–1.77)
T3 FREE: 3 pg/mL (ref 2.0–4.4)
TSH: 3.61 u[IU]/mL (ref 0.450–4.500)

## 2017-06-26 ENCOUNTER — Other Ambulatory Visit: Payer: PPO

## 2017-09-03 ENCOUNTER — Other Ambulatory Visit: Payer: Self-pay | Admitting: Thoracic Surgery (Cardiothoracic Vascular Surgery)

## 2017-09-03 DIAGNOSIS — C3410 Malignant neoplasm of upper lobe, unspecified bronchus or lung: Secondary | ICD-10-CM

## 2017-09-09 ENCOUNTER — Other Ambulatory Visit: Payer: Self-pay | Admitting: Thoracic Surgery (Cardiothoracic Vascular Surgery)

## 2017-09-09 DIAGNOSIS — R918 Other nonspecific abnormal finding of lung field: Secondary | ICD-10-CM

## 2017-09-30 ENCOUNTER — Other Ambulatory Visit: Payer: PPO

## 2017-09-30 ENCOUNTER — Ambulatory Visit: Payer: PPO | Admitting: Thoracic Surgery (Cardiothoracic Vascular Surgery)

## 2017-10-21 ENCOUNTER — Encounter: Payer: Self-pay | Admitting: Thoracic Surgery (Cardiothoracic Vascular Surgery)

## 2017-10-21 ENCOUNTER — Ambulatory Visit: Payer: PPO | Admitting: Thoracic Surgery (Cardiothoracic Vascular Surgery)

## 2017-10-21 ENCOUNTER — Ambulatory Visit
Admission: RE | Admit: 2017-10-21 | Discharge: 2017-10-21 | Disposition: A | Discharge status: Home / Self Care | Payer: PPO | Source: Ambulatory Visit | Attending: Thoracic Surgery (Cardiothoracic Vascular Surgery) | Admitting: Thoracic Surgery (Cardiothoracic Vascular Surgery)

## 2017-10-21 ENCOUNTER — Other Ambulatory Visit: Payer: PPO

## 2017-10-21 ENCOUNTER — Other Ambulatory Visit: Payer: Self-pay

## 2017-10-21 VITALS — BP 146/68 | HR 85 | Resp 18 | Ht 64.0 in | Wt 205.8 lb

## 2017-10-21 DIAGNOSIS — Z85118 Personal history of other malignant neoplasm of bronchus and lung: Secondary | ICD-10-CM

## 2017-10-21 DIAGNOSIS — Z902 Acquired absence of lung [part of]: Secondary | ICD-10-CM

## 2017-10-21 DIAGNOSIS — R911 Solitary pulmonary nodule: Secondary | ICD-10-CM | POA: Diagnosis not present

## 2017-10-21 DIAGNOSIS — R918 Other nonspecific abnormal finding of lung field: Secondary | ICD-10-CM

## 2017-10-21 DIAGNOSIS — J439 Emphysema, unspecified: Secondary | ICD-10-CM | POA: Diagnosis not present

## 2017-10-21 NOTE — Progress Notes (Signed)
[name]       [place],[name]             [phone]      HPI: [name] returns for a scheduled follow-up visit  [name] skin is a 72 year old woman who presented with an acute MI in 2012.  She underwent emergent coronary artery bypass grafting.  She was noted to have a left upper lobe mass.  She had a left upper lobectomy after recovering from her CABG.  The tumor was a stage IA non-small cell carcinoma.  She did not require adjuvant therapy.  I have been following her since that time.  I last saw her in the office a year ago.  She was doing well at that time with no evidence of recurrent lung cancer and no symptoms of coronary disease.  She also sees Dr.Kelly for her cardiac issues.  She says that she is been under a lot of stress.  She spends a lot of time back and forth between [name] and [name].  She continues to smoke less than a pack of cigarettes daily.  She has a greater than 30-pack-year history overall.  Past Medical History:  Diagnosis Date  . CAD (coronary artery disease)   . Cancer ([place])   . Dyslipidemia   . GERD (gastroesophageal reflux disease)   . HTN (hypertension)   . Myocardial infarct ([place])   . Non-small cell lung cancer ([place]) 08/2010   Stage IA, status post left upper lobectomy July 2012  . Tobacco abuse     Current Outpatient Medications  Medication Sig Dispense Refill  . aspirin 81 MG tablet Take 81 mg by mouth daily.    . Coenzyme Q10 (EQL COQ10) 300 MG CAPS Take by mouth.    . levothyroxine (SYNTHROID, LEVOTHROID) 25 MCG tablet Take 0.5 tablets (12.5 mcg total) by mouth daily. In addition to the 50 mcg 90 tablet 1  . levothyroxine (SYNTHROID, LEVOTHROID) 50 MCG tablet TAKE 1 TABLET (50 MCG TOTAL) BY MOUTH DAILY BEFORE BREAKFAST. 90 tablet 3  . metoprolol succinate (TOPROL-XL) 100 MG 24 hr tablet TAKE 1 TABLET (100 MG TOTAL) BY MOUTH DAILY. TAKE WITH OR IMMEDIATELY FOLLOWING A MEAL. 90 tablet 3  .  Omega-3 Fatty Acids (FISH OIL) 1000 MG CAPS Take 2,000 mg by mouth daily.    . rosuvastatin (CRESTOR) 40 MG tablet Take 1 tablet (40 mg total) by mouth daily. 90 tablet 3  . amLODipine (NORVASC) 5 MG tablet Take 1 tablet (5 mg total) by mouth daily. 90 tablet 3   No current facility-administered medications for this visit.     Physical Exam BP (!) 146/68 (BP Location: Left Arm, Patient Position: Sitting, Cuff Size: Normal)   Pulse 85   Resp 18   Ht 5\' 4"  (1.626 m)   Wt 205 lb 12.8 oz (93.4 kg)   SpO2 93% Comment: RA  BMI 35.7 kg/m  72 year old obese woman in no acute distress Alert and oriented x3 with no focal deficits No cervical or supraclavicular adenopathy Cardiac regular rate and rhythm normal S1 and S2 Lungs diminished at left base, otherwise clear   Diagnostic Tests: CT CHEST WITHOUT CONTRAST  TECHNIQUE: Multidetector CT imaging of the chest was performed following the standard protocol without IV contrast.  COMPARISON:  10/15/2016.  FINDINGS: Cardiovascular: Previous median sternotomy and CABG procedure. Heart size is normal. Aortic atherosclerosis noted. No pericardial effusion.  Mediastinum/Nodes: Normal appearance of the thyroid gland. The  trachea appears patent and is midline. Normal appearance of the esophagus. No mediastinal or axillary adenopathy.  Lungs/Pleura: Mild to moderate changes of emphysema. Status post left upper lobectomy. Right middle lobe perifissural nodule measures 5 mm, image 68/8. Stable from previous exam. No new pulmonary nodules or masses identified.  Upper Abdomen: No acute abnormalities noted within the abdomen. Low-attenuation structure within the lateral segment of left lobe measures 1.3 cm, image 116/2. Stable from previous exam.  Musculoskeletal: No aggressive lytic or sclerotic bone lesions.  IMPRESSION: 1. Stable CT of the chest. Status post left upper lobectomy without evidence to suggest recurrent tumor or  metastatic disease. 2. Aortic Atherosclerosis (ICD10-I70.0) and Emphysema (ICD10-J43.9).   Electronically Signed   By: Kerby Moors M.D.   On: 10/21/2017 14:19 I personally reviewed the CT images and concur with the findings noted above  Impression: [name] is a 72 year old woman with a past medical history of tobacco abuse, stage IA non-small cell lung cancer status post left upper lobectomy, coronary artery disease status post MI and emergent coronary artery bypass grafting, obesity, hypertension, hyperlipidemia, and impaired glucose tolerance.  Coronary artery disease-she is now 7 years out from coronary artery bypass grafting she is not having any anginal symptoms.  Lung cancer-stage Ia non-small cell carcinoma.  No recurrence out to 5 years.  Tobacco abuse-greater than 30 pack years.  Unfortunately ongoing.  She is currently smoking about 1/2 pack daily.  I emphasized the importance of tobacco cessation for both her pulmonary and cardiac health.  She does not seem to be interested in quitting currently.  Plan: Quit smoking Return in 1 year with CT chest  Melrose Nakayama, MD [facility] [phone]

## 2017-11-24 ENCOUNTER — Other Ambulatory Visit: Payer: Self-pay | Admitting: Cardiovascular Disease

## 2017-12-14 ENCOUNTER — Other Ambulatory Visit: Payer: Self-pay | Admitting: Cardiovascular Disease

## 2017-12-22 DIAGNOSIS — E039 Hypothyroidism, unspecified: Secondary | ICD-10-CM

## 2017-12-22 DIAGNOSIS — I2510 Atherosclerotic heart disease of native coronary artery without angina pectoris: Secondary | ICD-10-CM

## 2017-12-22 DIAGNOSIS — E785 Hyperlipidemia, unspecified: Secondary | ICD-10-CM

## 2017-12-22 DIAGNOSIS — I10 Essential (primary) hypertension: Secondary | ICD-10-CM

## 2017-12-22 DIAGNOSIS — I2583 Coronary atherosclerosis due to lipid rich plaque: Principal | ICD-10-CM

## 2017-12-22 DIAGNOSIS — Z79899 Other long term (current) drug therapy: Secondary | ICD-10-CM

## 2018-01-22 DIAGNOSIS — E785 Hyperlipidemia, unspecified: Secondary | ICD-10-CM | POA: Diagnosis not present

## 2018-01-22 DIAGNOSIS — I2510 Atherosclerotic heart disease of native coronary artery without angina pectoris: Secondary | ICD-10-CM | POA: Diagnosis not present

## 2018-01-22 DIAGNOSIS — I2583 Coronary atherosclerosis due to lipid rich plaque: Secondary | ICD-10-CM | POA: Diagnosis not present

## 2018-01-22 DIAGNOSIS — Z79899 Other long term (current) drug therapy: Secondary | ICD-10-CM | POA: Diagnosis not present

## 2018-01-22 DIAGNOSIS — I10 Essential (primary) hypertension: Secondary | ICD-10-CM | POA: Diagnosis not present

## 2018-01-22 DIAGNOSIS — E039 Hypothyroidism, unspecified: Secondary | ICD-10-CM | POA: Diagnosis not present

## 2018-01-23 LAB — CBC
HEMATOCRIT: 45.5 % (ref 34.0–46.6)
Hemoglobin: 14.8 g/dL (ref 11.1–15.9)
MCH: 28.1 pg (ref 26.6–33.0)
MCHC: 32.5 g/dL (ref 31.5–35.7)
MCV: 86 fL (ref 79–97)
Platelets: 234 10*3/uL (ref 150–450)
RBC: 5.27 x10E6/uL (ref 3.77–5.28)
RDW: 13.1 % (ref 12.3–15.4)
WBC: 10.1 10*3/uL (ref 3.4–10.8)

## 2018-01-23 LAB — LIPID PANEL
CHOL/HDL RATIO: 2.8 ratio (ref 0.0–4.4)
CHOLESTEROL TOTAL: 114 mg/dL (ref 100–199)
HDL: 41 mg/dL (ref >39)
LDL CALC: 44 mg/dL (ref 0–99)
TRIGLYCERIDES: 146 mg/dL (ref 0–149)
VLDL CHOLESTEROL CAL: 29 mg/dL (ref 5–40)

## 2018-01-23 LAB — COMPREHENSIVE METABOLIC PANEL
A/G RATIO: 1.6 (ref 1.2–2.2)
ALT: 30 IU/L (ref 0–32)
AST: 23 IU/L (ref 0–40)
Albumin: 4.1 g/dL (ref 3.5–4.8)
Alkaline Phosphatase: 75 IU/L (ref 39–117)
BILIRUBIN TOTAL: 0.2 mg/dL (ref 0.0–1.2)
BUN/Creatinine Ratio: 16 (ref 12–28)
BUN: 12 mg/dL (ref 8–27)
CALCIUM: 9.7 mg/dL (ref 8.7–10.3)
CHLORIDE: 103 mmol/L (ref 96–106)
CO2: 24 mmol/L (ref 20–29)
Creatinine, Ser: 0.77 mg/dL (ref 0.57–1.00)
GFR, EST AFRICAN AMERICAN: 90 mL/min/{1.73_m2} (ref >59)
GFR, EST NON AFRICAN AMERICAN: 78 mL/min/{1.73_m2} (ref >59)
GLUCOSE: 96 mg/dL (ref 65–99)
Globulin, Total: 2.6 g/dL (ref 1.5–4.5)
POTASSIUM: 5.3 mmol/L — AB (ref 3.5–5.2)
Sodium: 142 mmol/L (ref 134–144)
TOTAL PROTEIN: 6.7 g/dL (ref 6.0–8.5)

## 2018-01-23 LAB — TSH: TSH: 3.85 u[IU]/mL (ref 0.450–4.500)

## 2018-02-01 ENCOUNTER — Other Ambulatory Visit: Payer: Self-pay | Admitting: Cardiovascular Disease

## 2018-02-15 ENCOUNTER — Other Ambulatory Visit: Payer: Self-pay | Admitting: Cardiovascular Disease

## 2018-03-02 ENCOUNTER — Ambulatory Visit: Payer: PPO | Admitting: Cardiovascular Disease

## 2018-03-02 ENCOUNTER — Encounter: Payer: Self-pay | Admitting: Cardiovascular Disease

## 2018-03-02 VITALS — BP 132/70 | HR 73 | Ht 64.0 in | Wt 207.8 lb

## 2018-03-02 DIAGNOSIS — Z720 Tobacco use: Secondary | ICD-10-CM | POA: Diagnosis not present

## 2018-03-02 DIAGNOSIS — E785 Hyperlipidemia, unspecified: Secondary | ICD-10-CM | POA: Diagnosis not present

## 2018-03-02 DIAGNOSIS — Z951 Presence of aortocoronary bypass graft: Secondary | ICD-10-CM

## 2018-03-02 DIAGNOSIS — I2583 Coronary atherosclerosis due to lipid rich plaque: Secondary | ICD-10-CM

## 2018-03-02 DIAGNOSIS — I10 Essential (primary) hypertension: Secondary | ICD-10-CM

## 2018-03-02 DIAGNOSIS — I2510 Atherosclerotic heart disease of native coronary artery without angina pectoris: Secondary | ICD-10-CM

## 2018-03-02 DIAGNOSIS — E039 Hypothyroidism, unspecified: Secondary | ICD-10-CM

## 2018-03-02 DIAGNOSIS — Z85118 Personal history of other malignant neoplasm of bronchus and lung: Secondary | ICD-10-CM

## 2018-03-02 NOTE — Progress Notes (Signed)
Patient ID: [name], female   DOB: 08/21/1945, 73 y.o.   MRN: [id number] n       HPI: [name] is a 73 y.o. female who presents to the office today for a 12 month follow up cardiology evaluation.  [name] suffered initial anterior wall myocardial infarction with ventricular fibrillation arrest requiring resuscitation and PTCA of her LAD in 1997. On 07/05/2010 she presented with increasing shortness of breath and was found to have critical life threatening anatomy with 99% left main equivalent disease.   Later that afternoon she underwent emergent CABG revascularization surgery by Dr. Roxan Hockey with a LIMA to the LAD, a vein graft to the second branch of the obtuse marginal vessel, and vein graft to the PDA.  Chest x-rays disclosed a left upper lung nodule and ultimately this proved to be adenocarcinoma for which she ultimately underwent upper lobe resection in July 2012. Additional problems include hypertension, hyperlipidemia, obesity and probable metabolic syndrome. A nuclear perfusion study in July 2012 showed a post stress ejection fraction of 46% but otherwise fairly normal perfusion. EF at cath was 35% prior to surgery.   In December 2013 an NMR lipoprofile revealed that despite an LDL of 50 she had increased LDL particle #1312, and increased small LDL particle number at 761. Her insulin resistance was significantly elevated at 70. HDL particle number was low at 26.8 and a triglyceride of 152.evaluation.  An echo Doppler study on 08/04/2012 showed an ejection fraction at 50 - 55%. She had mild LVH. There was grade 1 diastolic dysfunction with distal anterior, anteroseptal and apical LAD scar. There is mild tricuspid regurgitation and mild mitral regurgitation. PA pressure estimated 32 mm.  A LexiScan study in 07/2012 was low risk. Ejection fraction was 56%. There is no evidence for ischemia. She had laboratory which showed an LDL particle number at 830 with a target LDL of 38  HDL 42 triglycerides 170. Her insulin resistance was elevated at 60. Hemoglobin A1c was 5.7.  When I last saw her in October 2017 she was still smoking one pack of cigarettes every 3 day.  She sees Dr. Roxan Hockey on a yearly basis and  saw him in August 2017..  A follow-up chest CT was reviewed and she was stable without evidence of local recurrence or metastatic disease status post left upper lobectomy for stage IA non-small cell carcinoma.  Over the past year, she denies any episodes of recurrent chest pain or awareness of palpitations.  She underwent a nuclear perfusion study on 11/05/2016, which revealed normal perfusion and function without scar or ischemia. She saw Dr. Roxan Hockey in August 2018 and during that evaluation, she was significantly hypertensive with a blood pressure 170/87.  She was on Toprol-XL 100 mg, rosuvastatin 40 mg, and levothyroxine 50 g.  Unfortunately she still smokes and is smoking anywhere from 1-3/4 of a pack daily.  She had laboratory in May and on one chemistry evaluation.  Her potassium was 5.5, a follow-up evaluation showed normal potassium.  Cholesterol was 123, HDL 39, and LDL 59.    When I saw her in October 2018, I initiated amlodipine 5 mg.  She was undergoing low dose CT imaging for CA.  We again discussed complete smoking cessation.    I last saw her in January 2019.  The past year, she has been going back and forth between [facility].  He denies any episodes of chest pressure.  She denies any significant shortness of breath.  She has  not been very active.  Unfortunately she continues to smoke cigarettes and typically a pack of cigarettes may last 2 to 3 days.  He has seen Dr. Roxan Hockey for cardiothoracic surgery follow-up in September 2019.  Laboratory in December 2019 showed an LDL cholesterol at 44, HDL 41, total cholesterol 114 and triglycerides 146.  She continues to be on amlodipine 5 mg and Toprol-XL 100 mg for hypertension and her CAD.  She  is on rosuvastatin 40 mg for hyperlipidemia in addition to omega-3 fatty acid.  She has hypothyroidism on levothyroxine.  She continues to be on daily aspirin.  She presents for reevaluation.  Past Medical History:  Diagnosis Date  . CAD (coronary artery disease)   . Cancer ([place])   . Dyslipidemia   . GERD (gastroesophageal reflux disease)   . HTN (hypertension)   . Myocardial infarct ([place])   . Non-small cell lung cancer ([place]) 08/2010   Stage IA, status post left upper lobectomy July 2012  . Tobacco abuse     Past Surgical History:  Procedure Laterality Date  . CARDIAC CATHETERIZATION  07/06/2010   see CABG report - pt sent to OR  . CARDIOVASCULAR STRESS TEST  09/11/2010   R/S MV - normal perfusion in all regions, EF 46%, no scintigraphic evidence of inducible myocardial ischemia; global LV systolic function mildly reduced; no significant wall motion abnormalities noted; Exercise capacity 7 METS; EKG negative for ischemia; low risk study, no signifcant change from previous study 08/2003  . CORONARY ARTERY BYPASS GRAFT  07/11/2010   LIMA to LAD; SVG to 2nd branch OM; SVG to posterior descending artery  . LEFT VATS  09/17/2010  . TEE WITHOUT CARDIOVERSION  07/06/2010   during emergent CABG surgery; 2-3+ mitral regurgitation    No Known Allergies  Current Outpatient Medications  Medication Sig Dispense Refill  . amLODipine (NORVASC) 5 MG tablet TAKE 1 TABLET BY MOUTH EVERY DAY 90 tablet 4  . aspirin 81 MG tablet Take 81 mg by mouth daily.    . Coenzyme Q10 (EQL COQ10) 300 MG CAPS Take by mouth.    . levothyroxine (SYNTHROID, LEVOTHROID) 25 MCG tablet Take 0.5 tablets (12.5 mcg total) by mouth daily. In addition to the 50 mcg 90 tablet 1  . levothyroxine (SYNTHROID, LEVOTHROID) 50 MCG tablet TAKE 1 TABLET (50 MCG TOTAL) BY MOUTH DAILY BEFORE BREAKFAST. 90 tablet 3  . metoprolol succinate (TOPROL-XL) 100 MG 24 hr tablet Take 1 tablet (100 mg total) by mouth daily. KEEP OV. 90 tablet 0  .  Omega-3 Fatty Acids (FISH OIL) 1000 MG CAPS Take 2,000 mg by mouth daily.    . rosuvastatin (CRESTOR) 40 MG tablet TAKE 1 TABLET BY MOUTH EVERY DAY 90 tablet 0   No current facility-administered medications for this visit.     Also she is married has 3 children 5 grandchildren. She is an ex-smoker. She does not routinely exercise. There is no alcohol use.  ROS General: Negative; No fevers, chills, or night sweats; positive for purposeful weight loss HEENT: Negative; No changes in vision or hearing, sinus congestion, difficulty swallowing Pulmonary: Negative; No cough, wheezing, shortness of breath, hemoptysis Cardiovascular: Negative; No chest pain, presyncope, syncope, palpitations GI: Negative; No nausea, vomiting, diarrhea, or abdominal pain GU: Negative; No dysuria, hematuria, or difficulty voiding Musculoskeletal: Negative; no myalgias, joint pain, or weakness Hematologic/Oncology: Negative; no easy bruising, bleeding Endocrine: Positive for insulin resistance; no heat/cold intolerance; no diabetes Neuro: Negative; no changes in balance, headaches Skin: Negative; No rashes  or skin lesions Psychiatric: Negative; No behavioral problems, depression Sleep: Negative; No snoring, daytime sleepiness, hypersomnolence, bruxism, restless legs, hypnogognic hallucinations, no cataplexy Other comprehensive 14 point system review is negative.   PE BP 132/70   Pulse 73   Ht _0  (1.626 m)   Wt 207 lb 12.8 oz (94.3 kg)   BMI 35.67 kg/m    Repeat blood pressure by me was 120/78  Wt Readings from Last 3 Encounters:  03/02/18 207 lb 12.8 oz (94.3 kg)  10/21/17 205 lb 12.8 oz (93.4 kg)  05/15/17 207 lb (93.9 kg)   General: Alert, oriented, no distress.  Moderate obesity Skin: normal turgor, no rashes, warm and dry HEENT: Normocephalic, atraumatic. Pupils equal round and reactive to light; sclera anicteric; extraocular muscles intact;  Nose without nasal septal hypertrophy Mouth/Parynx  benign; Mallinpatti scale 3 Neck: Thick neck; No JVD, no carotid bruits; normal carotid upstroke Lungs: clear to ausculatation and percussion; no wheezing or rales Chest wall: without tenderness to palpitation Heart: PMI not displaced, RRR, s1 s2 normal, 1/6 systolic murmur, no diastolic murmur, no rubs, gallops, thrills, or heaves Abdomen: soft, nontender; no hepatosplenomehaly, BS+; abdominal aorta nontender and not dilated by palpation. Back: no CVA tenderness Pulses 2+ Musculoskeletal: full range of motion, normal strength, no joint deformities Extremities: no clubbing cyanosis or edema, Homan's sign negative  Neurologic: grossly nonfocal; Cranial nerves grossly wnl Psychologic: Normal mood and affect   ECG (independently read by me): Normal sinus rhythm at 73 bpm.  Anteroseptal QS complex  January 2019 ECG (independently read by me): normal sinus rhythm at 76 bpm.  Q wave in lead 3.  Nonspecific T changes.  October 2018 ECG (independently read by me): Normal sinus rhythm at 73 bpm.  Q wave in 3 and very small Q wave in aVF.  Nonspecific T changes anteriorly.  Normal intervals.  October 2017 ECG (independently read by me): Normal sinus rhythm at 65 bpm.  Nonspecific T changes.  Q wave in lead 3.  October 2016 ECG (independently read by me): Normal sinus rhythm at 77 bpm.  Nonspecific T changes anteriorly which are old.  Normal intervals.  September 2015 ECG (independently read by me): Normal sinus rhythm at 70 beats per minute.  Poor progression anteriorly.  Small Q-wave in lead 3.  Nonspecific T changes anteriorly, unchanged  04/16/2013 ECG (independently read by me): Normal sinus rhythm at 65 beats per minute. T wave changes V1 through V6, nonspecific, unchanged. QTc interval 455 ms  Prior ECG of October 15 2012: NSR @ 62 bpm with T-wave abnormality anteriorly V1 through V5 unchanged.  LABS: BMP Latest Ref Rng & Units 01/22/2018 04/29/2017 12/10/2016  Glucose 65 - 99 mg/dL 96  100(H) 118(H)  BUN 8 - 27 mg/dL _1 Creatinine 0.57 - 1.00 mg/dL 0.77 0.88 0.78  BUN/Creat Ratio 12 - _2 Sodium 134 - 144 mmol/L 142 147(H) 144  Potassium 3.5 - 5.2 mmol/L 5.3(H) 5.6(H) 5.1  Chloride 96 - 106 mmol/L 103 107(H) 104  CO2 20 - 29 mmol/L _3 Calcium 8.7 - 10.3 mg/dL 9.7 9.4 10.0   Hepatic Function Latest Ref Rng & Units 01/22/2018 04/29/2017 12/06/2016  Total Protein 6.0 - 8.5 g/dL 6.7 6.7 6.8  Albumin 3.5 - 4.8 g/dL 4.1 4.1 4.3  AST 0 - 40 IU/L _4 ALT 0 - 32 IU/L 30 24 35(H)  Alk Phosphatase 39 - 117 IU/L 75 73 79  Total  Bilirubin 0.0 - 1.2 mg/dL 0.2 0.3 0.4   CBC Latest Ref Rng & Units 01/22/2018 04/29/2017 12/06/2016  WBC 3.4 - 10.8 x10E3/uL 10.1 9.9 7.9  Hemoglobin 11.1 - 15.9 g/dL 14.8 14.2 14.6  Hematocrit 34.0 - 46.6 % 45.5 45.2 44.0  Platelets 150 - 450 x10E3/uL 234 222 193   Lab Results  Component Value Date   MCV 86 01/22/2018   MCV 90 04/29/2017   MCV 88 12/06/2016   Lab Results  Component Value Date   TSH 3.850 01/22/2018   Lab Results  Component Value Date   HGBA1C 6.0 (H) 04/29/2017   Lipid Panel     Component Value Date/Time   CHOL 114 01/22/2018 0846   CHOL 114 07/22/2012 0840   TRIG 146 01/22/2018 0846   TRIG 170 (H) 07/22/2012 0840   HDL 41 01/22/2018 0846   HDL 42 07/22/2012 0840   CHOLHDL 2.8 01/22/2018 0846   CHOLHDL 3.2 06/26/2016 0859   VLDL 25 06/26/2016 0859   LDLCALC 44 01/22/2018 0846   LDLCALC 38 07/22/2012 0840      RADIOLOGY: No results found.  IMPRESSION:  1. Essential hypertension   2. Coronary artery disease due to lipid rich plaque   3. Hx of CABG   4. Hyperlipidemia with target LDL less than 70   5. Tobacco abuse   6. History of lung cancer   7. Hypothyroidism, unspecified type     ASSESSMENT AND PLAN: Ms. Sanford is a 73 year old Caucasian female who is 23 years following her initial anterior wall myocardial infarction with VF arrest for which she underwent PTCA emergently  of her LAD in 1997. She is almost 8 years following emergency CABG surgery for life-threatening anatomy with 99% left main disease and total occlusion of her RCA at the ostium with left to right collaterals. Her ejection fraction did improve and is now 50-55% with small area of subtle LAD scar.  She underwent a follow-up myocardial perfusion study on 11/05/2016, which continued to show normal perfusion without scar or ischemia .  She was found to have cancer, stage Ia, non-small cell carcinoma and has been without recurrence.  Her blood pressure today is well controlled on her current regimen of amlodipine and metoprolol.  On Toprol-XL 100 mg daily her resting pulse is in the 70s.  She continues to be on rosuvastatin for hyperlipidemia.  Most recent lipid studies on January 22, 2018 were excellent with an LDL of 44.  She continues to be on aspirin therapy.  She has hypothyroidism on levothyroxine and TSH was 3.85.  Her last labs showed a upper normal potassium at 5.3.  We discussed potassium containing foods and reduction of intake.  We had a long discussion regarding the importance of complete smoking cessation.  We also discussed her obesity and the importance of exercising at least 30 minutes 5 days/week if at all possible.  He is followed by Dr. Raliegh Scarlet for primary care.  Long as she remains stable I will see her 1 year for reevaluation or sooner if problems arise.  Time spent: 25 minutes  Troy Sine, MD, [facility]  03/02/2018 11:14 AM

## 2018-03-02 NOTE — Patient Instructions (Signed)

## 2018-03-15 ENCOUNTER — Other Ambulatory Visit: Payer: Self-pay | Admitting: Cardiovascular Disease

## 2018-03-16 NOTE — Telephone Encounter (Signed)
Please review for refill.  

## 2018-03-16 NOTE — Telephone Encounter (Signed)
Rx has been sent to the pharmacy electronically. ° °

## 2018-03-19 ENCOUNTER — Other Ambulatory Visit: Payer: Self-pay | Admitting: *Deleted

## 2018-03-19 MED ORDER — ROSUVASTATIN CALCIUM 40 MG PO TABS: 40.0000 mg | ORAL_TABLET | Freq: Every day | ORAL | 0 refills | Status: DC

## 2018-04-29 ENCOUNTER — Other Ambulatory Visit: Payer: Self-pay | Admitting: Cardiovascular Disease

## 2018-05-25 ENCOUNTER — Other Ambulatory Visit: Payer: Self-pay | Admitting: Family Medicine

## 2018-05-25 DIAGNOSIS — E039 Hypothyroidism, unspecified: Secondary | ICD-10-CM

## 2018-06-02 ENCOUNTER — Other Ambulatory Visit: Payer: Self-pay | Admitting: Cardiovascular Disease

## 2018-06-02 NOTE — Telephone Encounter (Signed)
Rosuvastatin 40 mg refilled.

## 2018-06-16 ENCOUNTER — Telehealth: Payer: Self-pay | Admitting: Family Medicine

## 2018-06-16 NOTE — Telephone Encounter (Signed)
-----   Message from Jerilee Field, [place] sent at 05/25/2018  9:51 AM EDT ----- Patient is due for follow up, please call the patient to make an appointment.  Thanks. MPulliam, CMA/RT(R)

## 2018-06-16 NOTE — Telephone Encounter (Signed)
Left patient message to call office to set up Provider required OV.   --FYI to medical assistant.  --Dion Body

## 2018-08-08 ENCOUNTER — Other Ambulatory Visit: Payer: Self-pay | Admitting: Family Medicine

## 2018-08-08 DIAGNOSIS — E039 Hypothyroidism, unspecified: Secondary | ICD-10-CM

## 2018-08-13 ENCOUNTER — Encounter: Payer: Self-pay | Admitting: Family Medicine

## 2018-08-13 ENCOUNTER — Ambulatory Visit (INDEPENDENT_AMBULATORY_CARE_PROVIDER_SITE_OTHER): Payer: PPO | Admitting: Family Medicine

## 2018-08-13 ENCOUNTER — Other Ambulatory Visit: Payer: Self-pay

## 2018-08-13 VITALS — Ht 64.0 in | Wt 205.0 lb

## 2018-08-13 DIAGNOSIS — E039 Hypothyroidism, unspecified: Secondary | ICD-10-CM | POA: Diagnosis not present

## 2018-08-13 DIAGNOSIS — E785 Hyperlipidemia, unspecified: Secondary | ICD-10-CM | POA: Diagnosis not present

## 2018-08-13 DIAGNOSIS — Z720 Tobacco use: Secondary | ICD-10-CM

## 2018-08-13 DIAGNOSIS — F17210 Nicotine dependence, cigarettes, uncomplicated: Secondary | ICD-10-CM

## 2018-08-13 DIAGNOSIS — I10 Essential (primary) hypertension: Secondary | ICD-10-CM

## 2018-08-13 DIAGNOSIS — Z716 Tobacco abuse counseling: Secondary | ICD-10-CM

## 2018-08-13 DIAGNOSIS — R7302 Impaired glucose tolerance (oral): Secondary | ICD-10-CM | POA: Diagnosis not present

## 2018-08-13 MED ORDER — LEVOTHYROXINE SODIUM 25 MCG PO TABS: 12.5000 ug | ORAL_TABLET | Freq: Every day | ORAL | 0 refills | Status: DC

## 2018-08-13 NOTE — Progress Notes (Signed)
Telehealth office visit note for [name], D.O- at Primary Care at [facility]   I connected with current patient today and verified that I am speaking with the correct person using two identifiers.   . Location of the patient: Home . Location of the provider: Office Only the patient (+/- their family members at pt's discretion) and myself were participating in the encounter    - This visit type was conducted due to national recommendations for restrictions regarding the COVID-19 Pandemic (e.g. social distancing) in an effort to limit this patient's exposure and mitigate transmission in our community.  This format is felt to be most appropriate for this patient at this time.   - The patient did not have access to video technology or had technical difficulties with video requiring transitioning to audio format only. - No physical exam could be performed with this format, beyond that communicated to [place] by the patient/ family members as noted.   - Additionally my office staff/ schedulers discussed with the patient that there may be a monetary charge related to this service, depending on their medical insurance.   The patient expressed understanding, and agreed to proceed.       History of Present Illness:  Htn:     Doesn't check at all at home.  Getting meds from her cards- toprol and norvasc. No dizziness / lightheadedness etc.   CMP- 6 mo ago as well- r/w pt  Chol:    ldl 44 when last checked - 38mo ago.  hdl- 41;   tg 146;   Txed w Crestor by Dr Claiborne Billings- Cards  Hypothyroidism:    Out of meds 1 week-  Sx of depression- but "nothing" big per pt.   Pt lost to f/up was told 4 mo f/up when last seen 1 year and 3+ mo ago.    TSH 68mo ago 3.850  Pre-DM:   A1c- 6.0.    Pt thinks she doesn't need mask and feels Covid is fake.     Impression and Recommendations:    1. Hypertension, unspecified type   2. Acquired hypothyroidism   3. Dyslipidemia   4. Glucose intolerance (impaired  glucose tolerance)   5. Tobacco abuse   6. Tobacco abuse counseling      HTN- advised pt needs to check BP at home and f/up with Cards as they indicated  Thyroid-   Pt doing well and feels well.  Cont meds, stable  Chol- per Dr Claiborne Billings- labs UTD, crestor per Cards  Pre-DM-  Will need reck near future- last done was 6.0  12++mo ago.  Smoking-  Tob cess d/c pt.   55min spent in counseling.  Pt pre-contemplative phases  - As part of my medical decision making, I reviewed the following data within the [place] History obtained from pt /family, CMA notes reviewed and incorporated if applicable, Labs reviewed, Radiograph/ tests reviewed if applicable and OV notes from prior OV's with me, as well as other specialists she/he has seen since seeing me last, were all reviewed and used in my medical decision making process today.   - Additionally, discussion had with patient regarding txmnt plan, and their biases/concerns about that plan were used in my medical decision making today.   - The patient agreed with the plan and demonstrated an understanding of the instructions.   No barriers to understanding were identified.   - Red flag symptoms and signs discussed in detail.  Patient  expressed understanding regarding what to do in case of emergency\ urgent symptoms.  The patient was advised to call back or seek an in-person evaluation if the symptoms worsen or if the condition fails to improve as anticipated.   Return for FBW near future at pt convenience-less than 60d. Also needs medicare wellness- next 48mo.    Meds ordered this encounter  Medications  . levothyroxine (SYNTHROID) 25 MCG tablet    Sig: Take 0.5 tablets (12.5 mcg total) by mouth daily. In addition to the 50 mcg.  Patient needs office visit for further refills    Dispense:  45 tablet    Refill:  0    DX Code Needed  PATIENT WOULD LIKE 90 D/S OF LEVOTHYROXINE 25MCG. THANKS!.    Medications Discontinued During This  Encounter  Medication Reason  . levothyroxine (SYNTHROID, LEVOTHROID) 50 MCG tablet   . levothyroxine (SYNTHROID) 25 MCG tablet      I provided 18 minutes of non-face-to-face time during this encounter,with over 50% of the time in direct counseling on patients medical conditions/ medical concerns.  Additional time was spent with charting and coordination of care after the actual visit commenced.    Note:  This note was prepared with assistance of Dragon voice recognition software. Occasional wrong-word or sound-a-like substitutions may have occurred due to the inherent limitations of voice recognition software.   Megan Dance, DO 08/13/18 5:16pm    Patient Care Team    Relationship Specialty Notifications Start End  Megan Dance, DO PCP - General Family Medicine  01/16/17   Melrose Nakayama, MD  Cardiothoracic Surgery  12/31/10   Troy Sine, MD  Cardiology  12/31/10      -Vitals obtained; medications/ allergies reconciled;  personal medical, social, Sx etc.histories were updated by CMA, reviewed by me and are reflected in chart   Patient Active Problem List   Diagnosis Date Noted  . Myocardial infarct ([place]) 01/16/2017    Priority: High  . HTN (hypertension)     Priority: High  . Dyslipidemia     Priority: High  . Lung cancer, upper lobe ([place]) 07/09/2011    Priority: Medium  . GERD (gastroesophageal reflux disease)     Priority: Medium  . Morbidly obese ([place]) 05/15/2017  . Glucose intolerance (impaired glucose tolerance) 05/15/2017  . Tobacco abuse counseling 01/16/2017  . Acquired hypothyroidism 01/16/2017  . Family history of leukemia 01/16/2017  . Palpitations 07/21/2012  . CAD (coronary artery disease)   . Tobacco abuse   . Non-small cell lung cancer ([place]) 08/19/2010     Current Meds  Medication Sig  . amLODipine (NORVASC) 5 MG tablet TAKE 1 TABLET BY MOUTH EVERY DAY  . aspirin 81 MG tablet Take 81 mg by mouth daily.  . Coenzyme Q10 (EQL  COQ10) 300 MG CAPS Take by mouth.  . levothyroxine (SYNTHROID) 25 MCG tablet Take 0.5 tablets (12.5 mcg total) by mouth daily. In addition to the 50 mcg.  Patient needs office visit for further refills  . metoprolol succinate (TOPROL-XL) 100 MG 24 hr tablet Take 1 tablet (100 mg total) by mouth daily.  . Omega-3 Fatty Acids (FISH OIL) 1000 MG CAPS Take 2,000 mg by mouth daily.  . rosuvastatin (CRESTOR) 40 MG tablet TAKE 1 TABLET BY MOUTH EVERY DAY  . [DISCONTINUED] levothyroxine (SYNTHROID) 25 MCG tablet TAKE 0.5 TABLETS (12.5 MCG TOTAL) BY MOUTH DAILY. IN ADDITION TO THE 50 MCG. PATIENT NEEDS OFFICE VISIT FOR FURTHER REFILLS  . [DISCONTINUED] levothyroxine (SYNTHROID, LEVOTHROID)  50 MCG tablet TAKE 1 TABLET (50 MCG TOTAL) BY MOUTH DAILY BEFORE BREAKFAST.     Allergies:  No Known Allergies   ROS:  See above HPI for pertinent positives and negatives   Objective:   Height 5\' 4"  (1.626 m), weight 205 lb (93 kg).  (if some vitals are omitted, this means that patient was UNABLE to obtain them even though they were asked to get them prior to OV today.  They were asked to call us at their earliest convenience with these once obtained. )  General: A & O * 3; sounds in no acute distress; in usual state of health.  Skin: Pt confirms warm and dry extremities and pink fingertips HEENT: Pt confirms lips non-cyanotic Chest: Patient confirms normal chest excursion and movement Respiratory: speaking in full sentences, no conversational dyspnea; patient confirms no use of accessory muscles Psych: insight appears good, mood- appears full

## 2018-08-17 ENCOUNTER — Telehealth: Payer: Self-pay | Admitting: Family Medicine

## 2018-08-17 NOTE — Telephone Encounter (Signed)
Called patient and she is aware that RX was sent for 90 days at [place]. MPulliam, CMA/RT(R)

## 2018-08-17 NOTE — Telephone Encounter (Signed)
Patient called states provider said would send in a  90dy supply of the Synthroid to her pharmacy----Pt states they only gave her 15 pills w/ no refills---- advised pt that an OV is being required  levothyroxine (SYNTHROID) 25 MCG tablet [[id number]]   Order Details Dose: 12.5 mcg Route: Oral Frequency: Daily  Note to Pharmacy:  DX Code Needed PATIENT WOULD LIKE 90 D/S OF LEVOTHYROXINE 25MCG. THANKS!Jeannie Fend Quantity: 45 tablet Refills: 0 Fills remaining: --        Sig: Take 0.5 tablets (12.5 mcg total) by mouth daily. In addition to the 50 mcg. Patient needs office visit for further refills     --Pt states pharmacy is :  [id number] [address], [place] [phone] (Phone) [phone] (Fax)   --glh

## 2018-08-18 ENCOUNTER — Other Ambulatory Visit: Payer: Self-pay

## 2018-08-18 ENCOUNTER — Other Ambulatory Visit: Payer: PPO

## 2018-08-18 DIAGNOSIS — Z0000 Encounter for general adult medical examination without abnormal findings: Secondary | ICD-10-CM | POA: Diagnosis not present

## 2018-08-18 DIAGNOSIS — E785 Hyperlipidemia, unspecified: Secondary | ICD-10-CM

## 2018-08-18 DIAGNOSIS — E039 Hypothyroidism, unspecified: Secondary | ICD-10-CM

## 2018-08-18 DIAGNOSIS — R7302 Impaired glucose tolerance (oral): Secondary | ICD-10-CM

## 2018-08-18 DIAGNOSIS — I10 Essential (primary) hypertension: Secondary | ICD-10-CM | POA: Diagnosis not present

## 2018-08-19 LAB — LIPID PANEL
Chol/HDL Ratio: 3 ratio (ref 0.0–4.4)
Cholesterol, Total: 128 mg/dL (ref 100–199)
HDL: 42 mg/dL (ref >39)
LDL Calculated: 60 mg/dL (ref 0–99)
Triglycerides: 132 mg/dL (ref 0–149)
VLDL Cholesterol Cal: 26 mg/dL (ref 5–40)

## 2018-08-19 LAB — COMPREHENSIVE METABOLIC PANEL
ALT: 27 IU/L (ref 0–32)
AST: 19 IU/L (ref 0–40)
Albumin/Globulin Ratio: 1.7 (ref 1.2–2.2)
Albumin: 4.5 g/dL (ref 3.7–4.7)
Alkaline Phosphatase: 68 IU/L (ref 39–117)
BUN/Creatinine Ratio: 13 (ref 12–28)
BUN: 9 mg/dL (ref 8–27)
Bilirubin Total: 0.3 mg/dL (ref 0.0–1.2)
CO2: 25 mmol/L (ref 20–29)
Calcium: 9.6 mg/dL (ref 8.7–10.3)
Chloride: 105 mmol/L (ref 96–106)
Creatinine, Ser: 0.67 mg/dL (ref 0.57–1.00)
GFR calc Af Amer: 102 mL/min/{1.73_m2} (ref >59)
GFR calc non Af Amer: 89 mL/min/{1.73_m2} (ref >59)
Globulin, Total: 2.6 g/dL (ref 1.5–4.5)
Glucose: 98 mg/dL (ref 65–99)
Potassium: 5.1 mmol/L (ref 3.5–5.2)
Sodium: 145 mmol/L — ABNORMAL HIGH (ref 134–144)
Total Protein: 7.1 g/dL (ref 6.0–8.5)

## 2018-08-19 LAB — CBC WITH DIFFERENTIAL/PLATELET
Basophils Absolute: 0.1 10*3/uL (ref 0.0–0.2)
Basos: 1 %
EOS (ABSOLUTE): 0.2 10*3/uL (ref 0.0–0.4)
Eos: 2 %
Hematocrit: 45.2 % (ref 34.0–46.6)
Hemoglobin: 15.1 g/dL (ref 11.1–15.9)
Immature Grans (Abs): 0 10*3/uL (ref 0.0–0.1)
Immature Granulocytes: 0 %
Lymphocytes Absolute: 3.4 10*3/uL — ABNORMAL HIGH (ref 0.7–3.1)
Lymphs: 38 %
MCH: 28.7 pg (ref 26.6–33.0)
MCHC: 33.4 g/dL (ref 31.5–35.7)
MCV: 86 fL (ref 79–97)
Monocytes Absolute: 0.9 10*3/uL (ref 0.1–0.9)
Monocytes: 10 %
Neutrophils Absolute: 4.4 10*3/uL (ref 1.4–7.0)
Neutrophils: 49 %
Platelets: 218 10*3/uL (ref 150–450)
RBC: 5.26 x10E6/uL (ref 3.77–5.28)
RDW: 13 % (ref 11.7–15.4)
WBC: 9 10*3/uL (ref 3.4–10.8)

## 2018-08-19 LAB — HEMOGLOBIN A1C
Est. average glucose Bld gHb Est-mCnc: 131 mg/dL
Hgb A1c MFr Bld: 6.2 % — ABNORMAL HIGH (ref 4.8–5.6)

## 2018-08-19 LAB — T3: T3, Total: 131 ng/dL (ref 71–180)

## 2018-08-19 LAB — T4, FREE: Free T4: 1.18 ng/dL (ref 0.82–1.77)

## 2018-08-19 LAB — VITAMIN D 25 HYDROXY (VIT D DEFICIENCY, FRACTURES): Vit D, 25-Hydroxy: 28.3 ng/mL — ABNORMAL LOW (ref 30.0–100.0)

## 2018-08-19 LAB — TSH: TSH: 3.96 u[IU]/mL (ref 0.450–4.500)

## 2018-09-10 ENCOUNTER — Other Ambulatory Visit: Payer: Self-pay | Admitting: Cardiovascular Disease

## 2018-09-14 ENCOUNTER — Other Ambulatory Visit: Payer: Self-pay | Admitting: Cardiovascular Disease

## 2018-09-15 ENCOUNTER — Other Ambulatory Visit: Payer: Self-pay | Admitting: Family Medicine

## 2018-09-16 ENCOUNTER — Other Ambulatory Visit: Payer: Self-pay

## 2018-09-21 ENCOUNTER — Other Ambulatory Visit: Payer: Self-pay | Admitting: Cardiovascular Disease

## 2018-09-22 ENCOUNTER — Telehealth: Payer: Self-pay | Admitting: Family Medicine

## 2018-09-22 NOTE — Telephone Encounter (Signed)
Patient called and left VM asking to speak with Dr. Daisy Floro medical assistant, she did not specify her question or concern on the VM, just that she wanted to speak with her when available.

## 2018-09-22 NOTE — Telephone Encounter (Signed)
Patient was calling in regards to refill on levothyroxine 50 mcg - informed patient that Dr. Claiborne Billings has sent over 90 day supply with 1 refill. MPulliam, CMA/RT(R)

## 2018-09-25 ENCOUNTER — Other Ambulatory Visit: Payer: Self-pay | Admitting: Thoracic Surgery (Cardiothoracic Vascular Surgery)

## 2018-09-25 DIAGNOSIS — Z85118 Personal history of other malignant neoplasm of bronchus and lung: Secondary | ICD-10-CM

## 2018-09-25 DIAGNOSIS — Z902 Acquired absence of lung [part of]: Secondary | ICD-10-CM

## 2018-10-23 ENCOUNTER — Ambulatory Visit
Admission: RE | Admit: 2018-10-23 | Discharge: 2018-10-23 | Disposition: A | Discharge status: Home / Self Care | Payer: PPO | Source: Ambulatory Visit | Attending: Thoracic Surgery (Cardiothoracic Vascular Surgery) | Admitting: Thoracic Surgery (Cardiothoracic Vascular Surgery)

## 2018-10-23 DIAGNOSIS — J439 Emphysema, unspecified: Secondary | ICD-10-CM | POA: Diagnosis not present

## 2018-10-23 DIAGNOSIS — Z902 Acquired absence of lung [part of]: Secondary | ICD-10-CM

## 2018-10-23 DIAGNOSIS — Z85118 Personal history of other malignant neoplasm of bronchus and lung: Secondary | ICD-10-CM

## 2018-10-27 ENCOUNTER — Encounter: Payer: Self-pay | Admitting: Thoracic Surgery (Cardiothoracic Vascular Surgery)

## 2018-10-27 ENCOUNTER — Ambulatory Visit: Payer: PPO | Admitting: Thoracic Surgery (Cardiothoracic Vascular Surgery)

## 2018-10-27 ENCOUNTER — Other Ambulatory Visit: Payer: Self-pay

## 2018-10-27 VITALS — BP 134/73 | HR 77 | Temp 97.7°F | Resp 16 | Ht 64.0 in | Wt 205.0 lb

## 2018-10-27 DIAGNOSIS — Z902 Acquired absence of lung [part of]: Secondary | ICD-10-CM

## 2018-10-27 DIAGNOSIS — C3410 Malignant neoplasm of upper lobe, unspecified bronchus or lung: Secondary | ICD-10-CM

## 2018-10-27 NOTE — Progress Notes (Signed)
[address]       [place],[name] [address]             [phone]       HPI: [name] returns for a scheduled follow-up visit   [name] is a 73 year old woman with a past medical history significant for coronary artery disease, MI, coronary artery bypass grafting, stage Ia non-small cell carcinoma left upper lobe, status post left upper lobectomy, hypertension, hyperlipidemia, reflux, and ongoing tobacco use.  She presented with an MI in 2012 and underwent emergent coronary artery bypass grafting.  She was noted to have a left upper lobe mass.  I did a left upper lobectomy later after she had recovered from her CABG.  The tumor was a stage Ia non-small cell carcinoma.  She did not require adjuvant therapy.  I have been following her since that time.  In the interim since her last visit she feels well.  She is not having any chest pain, pressure, or tightness.  She is short of breath with heavy exertion but has not changed over the past year.  She continues to smoke, less than a pack a day.  She has no interest in quitting.  Past Medical History:  Diagnosis Date  . CAD (coronary artery disease)   . Cancer ([place])   . Dyslipidemia   . GERD (gastroesophageal reflux disease)   . HTN (hypertension)   . Myocardial infarct ([place])   . Non-small cell lung cancer ([place]) 08/2010   Stage IA, status post left upper lobectomy July 2012  . Tobacco abuse     Current Outpatient Medications  Medication Sig Dispense Refill  . amLODipine (NORVASC) 5 MG tablet TAKE 1 TABLET BY MOUTH EVERY DAY 90 tablet 4  . aspirin 81 MG tablet Take 81 mg by mouth daily.    . Coenzyme Q10 (EQL COQ10) 300 MG CAPS Take by mouth.    . levothyroxine (SYNTHROID) 25 MCG tablet Take 0.5 tablets (12.5 mcg total) by mouth daily. In addition to the 50 mcg.  Patient needs office visit for further refills 45 tablet 0  . levothyroxine (SYNTHROID) 50 MCG tablet TAKE 1 TABLET BY MOUTH DAILY BEFORE  BREAKFAST 90 tablet 1  . metoprolol succinate (TOPROL-XL) 100 MG 24 hr tablet Take 1 tablet (100 mg total) by mouth daily. 90 tablet 3  . Omega-3 Fatty Acids (FISH OIL) 1000 MG CAPS Take 2,000 mg by mouth daily.    . rosuvastatin (CRESTOR) 40 MG tablet TAKE 1 TABLET BY MOUTH EVERY DAY 90 tablet 1   No current facility-administered medications for this visit.     Physical Exam BP 134/73 (BP Location: Right Arm, Patient Position: Sitting, Cuff Size: Normal)   Pulse 77   Temp 97.7 F (36.5 C)   Resp 16   Ht 5\' 4"  (1.626 m)   Wt 205 lb (93 kg)   SpO2 94% Comment: RA  BMI 35.19 kg/m  Obese 73 year old woman in no acute distress Alert and oriented x3 with no focal deficit Lungs diminished at left base, otherwise clear Cardiac regular rate and rhythm normal S1 and S2 No cervical or supraclavicular adenopathy  Diagnostic Tests: CT CHEST WITHOUT CONTRAST  TECHNIQUE: Multidetector CT imaging of the chest was performed following the standard protocol without IV contrast.  COMPARISON:  10/21/2017  FINDINGS: Cardiovascular: The heart size is normal. No substantial pericardial effusion. Coronary artery calcification is evident. Status post CABG. Atherosclerotic calcification is noted in the wall  of the thoracic aorta.  Mediastinum/Nodes: No mediastinal lymphadenopathy. No evidence for gross hilar lymphadenopathy although assessment is limited by the lack of intravenous contrast on today's study. The esophagus has normal imaging features. There is no axillary lymphadenopathy.  Lungs/Pleura: Centrilobular emphsyema noted. Volume loss left hemithorax from prior left upper lobectomy again noted. The 5 mm right middle lobe perifissural nodule is stable.No new suspicious pulmonary nodule or mass.  Upper Abdomen: 10 mm low-density lesion lateral segment left liver is stable. Otherwise unremarkable.  Musculoskeletal: No worrisome lytic or sclerotic osseous abnormality.   IMPRESSION: 1. Stable exam.  No new or progressive interval findings. 2.  Aortic Atherosclerois (ICD10-170.0) 3.  Emphysema. (PVV74-M27.9)   Electronically Signed   By: [name] M.D.   On: 10/23/2018 11:08  Impression: [name] is a 73 year old woman with a history of tobacco abuse, lung cancer, left upper lobectomy, coronary artery disease, MI, coronary bypass grafting, hypertension, hyperlipidemia, and reflux.  She had coronary bypass grafting in 2012, followed by a left upper lobectomy.  She has been followed since that time.  Coronary artery disease-she is followed by Dr. Claiborne Billings.  She is not having any anginal symptoms.  Stage Ia non-small cell carcinoma-no evidence recurrent disease.  Now 8 years out from surgery.  More at risk for developing a new primary then recurrence at this point.  Tobacco abuse-ongoing.  She understands the importance of tobacco cessation, but has no interest in quitting.  Plan: Return in 1 year with CT chest  Melrose Nakayama, MD [facility] [phone]

## 2018-11-09 ENCOUNTER — Other Ambulatory Visit: Payer: Self-pay | Admitting: Family Medicine

## 2018-11-09 DIAGNOSIS — E039 Hypothyroidism, unspecified: Secondary | ICD-10-CM

## 2018-12-14 ENCOUNTER — Encounter: Payer: Self-pay | Admitting: Family Medicine

## 2018-12-14 ENCOUNTER — Other Ambulatory Visit: Payer: Self-pay

## 2018-12-14 ENCOUNTER — Ambulatory Visit (INDEPENDENT_AMBULATORY_CARE_PROVIDER_SITE_OTHER): Payer: PPO | Admitting: Family Medicine

## 2018-12-14 VITALS — BP 139/80 | HR 77 | Temp 98.4°F | Resp 12 | Ht 64.0 in | Wt 209.6 lb

## 2018-12-14 DIAGNOSIS — Z720 Tobacco use: Secondary | ICD-10-CM

## 2018-12-14 DIAGNOSIS — E559 Vitamin D deficiency, unspecified: Secondary | ICD-10-CM | POA: Diagnosis not present

## 2018-12-14 DIAGNOSIS — I10 Essential (primary) hypertension: Secondary | ICD-10-CM | POA: Diagnosis not present

## 2018-12-14 DIAGNOSIS — Z716 Tobacco abuse counseling: Secondary | ICD-10-CM | POA: Diagnosis not present

## 2018-12-14 DIAGNOSIS — E785 Hyperlipidemia, unspecified: Secondary | ICD-10-CM

## 2018-12-14 DIAGNOSIS — I219 Acute myocardial infarction, unspecified: Secondary | ICD-10-CM | POA: Diagnosis not present

## 2018-12-14 DIAGNOSIS — C3410 Malignant neoplasm of upper lobe, unspecified bronchus or lung: Secondary | ICD-10-CM | POA: Diagnosis not present

## 2018-12-14 DIAGNOSIS — R7302 Impaired glucose tolerance (oral): Secondary | ICD-10-CM

## 2018-12-14 DIAGNOSIS — E039 Hypothyroidism, unspecified: Secondary | ICD-10-CM

## 2018-12-14 LAB — POCT GLYCOSYLATED HEMOGLOBIN (HGB A1C): Hemoglobin A1C: 6.1 % — AB (ref 4.0–5.6)

## 2018-12-14 MED ORDER — VITAMIN D (ERGOCALCIFEROL) 1.25 MG (50000 UNIT) PO CAPS: ORAL_CAPSULE | ORAL | 3 refills | Status: DC

## 2018-12-14 NOTE — Progress Notes (Signed)
Impression and Recommendations:    1. Myocardial infarction, unspecified MI type, unspecified artery ([name])   2. Hypertension, unspecified type   3. Dyslipidemia   4. Malignant neoplasm of upper lobe of lung, unspecified laterality ([name])   5. Tobacco abuse   6. Tobacco abuse counseling   7. Morbidly obese ([name])   8. Glucose intolerance (impaired glucose tolerance)   9. Acquired hypothyroidism   10. Vitamin D deficiency     - Last in-office visit was 3 of 2019. - Need for lab work near future. - Patient agrees to lab work and A1c PRN.  Vitamin D - Last labs done 08/18/2018, and Vitamin D was too low at 28.3.   - Once-weekly prescription Vitamin D provided today. - Discussed benefits of adequate Vitamin D with patient today. - Education provided and all questions answered.  - Need for re-check today to monitor treatment. - Will continue to monitor.  Prediabetes - A1c up to 6.2 last check. - Repeat A1c in office today at 6.1.  - Counseled patient on prevention of diabetes and discussed dietary and lifestyle modification as first line.    - Importance of low carb, heart-healthy diet discussed with patient in addition to regular aerobic exercise of 107min 5d/week or more.   - Prudent dietary habits extensively discussed.  Education provided and all questions answered.  - We will continue to monitor  Dyslipidemia - LDL at goal last check at 60 (less than 70). - HDL at 42, and Triglycerides at 132, WNL.  - Continue treatment plan as established.  See med list. - Will continue to monitor.  - Advised patient to continue follow-up with cardiology as established, and send her cholesterol results to them for additional assessment.  Hypertension - BP stable at this time. - Advised patient to continue treatment plan as prescribed.  See med list. - STRONGLY recommended checking blood pressure at home, even if it's every couple of weeks. - Will continue to monitor and  adjust treatment plan as recommended.  Hypothyroidism - Stable at this time. - Patient asymptomatic on current treatment. - Continue management as established.  See med list. - Will continue to monitor.  Tobacco Abuse & Tobacco Abuse Counseling Told pt to think seriously about quitting smoking!  Told pt it is very important for his/her health and well being.  - Smoking cessation instruction/ counseling given of at least 5 minutes:  counseled patient on the dangers of tobacco use and reviewed strategies to maximize success - Discussed with patient that there are multiple treatments to aid in quitting smoking, however I explained none will work unless pt really wants to quit - Told to call 1-800-QUIT-NOW 575-427-0359) for free smoking cessation counseling and support, or pt can go online to www.heart.org - the American Heart Association website and search "quit smoking ".   BMI Counseling - Body mass index is 35.98 kg/m Explained to patient what BMI refers to, and what it means medically.    Told patient to think about it as a "medical risk stratification measurement" and how increasing BMI is associated with increasing risk/ or worsening state of various diseases such as hypertension, hyperlipidemia, diabetes, premature OA, depression etc.  American Heart Association guidelines for healthy diet, basically Mediterranean diet, and exercise guidelines of 30 minutes 5 days per week or more discussed in detail.  Health counseling performed.  All questions answered.  Lifestyle & Preventative Health Maintenance - Advised patient to continue working toward exercising to improve  overall mental, physical, and emotional health.    - Reviewed the "spokes of the wheel" of mood and health management.  Stressed the importance of ongoing prudent habits, including regular exercise, appropriate sleep hygiene, healthful dietary habits, and prayer/meditation to relax.  - Encouraged patient to engage in  daily physical activity, especially a formal exercise routine.  Recommended that the patient eventually strive for at least 150 minutes of moderate cardiovascular activity per week according to guidelines established by the [facility].   - Healthy dietary habits encouraged, including low-carb, and high amounts of lean protein in diet.   - Patient should also consume adequate amounts of water.  Recommendations - Extensive discussion held with patient regarding prudent follow-up.  Discussed policies and practices here at the clinic, and answered all questions about care team and health management during appointment.  - Discussed need for patient to continue to obtain yearly wellness visits, management and screenings with all established specialists.  Educated patient at length about the critical importance of keeping chronic health maintenance up to date to help reduce health risks, especially given history of lung cancer and MI.  - Novel Covid -19 counseling done; all questions were answered.   - Current CDC / federal and [place] guidelines reviewed with patient  - Reminded pt of extreme importance of social distancing; wearing a mask when out in public; insensate handwashing and cleaning of surfaces, avoiding unnecessary trips for shopping and avoiding ALL but emergency appts etc. - Told patient to be prepared, not scared; and be smart for the sake of others - Patient will call with any additional concerns  - Participated in lengthy conversation and all questions were answered.     Orders Placed This Encounter  Procedures  . POCT glycosylated hemoglobin (Hb A1C)    Meds ordered this encounter  Medications  . Vitamin D, Ergocalciferol, (DRISDOL) 1.25 MG (50000 UT) CAPS capsule    Sig: Take one tablet wkly    Dispense:  12 capsule    Refill:  3    There are no discontinued medications.   Gross side effects, risk and benefits, and alternatives of medications and treatment plan in  general discussed with patient.  Patient is aware that all medications have potential side effects and we are unable to predict every side effect or drug-drug interaction that may occur.   Patient will call with any questions prior to using medication if they have concerns.    Expresses verbal understanding and consents to current therapy and treatment regimen.  No barriers to understanding were identified.  Red flag symptoms and signs discussed in detail.  Patient expressed understanding regarding what to do in case of emergency\urgent symptoms  Please see AVS handed out to patient at the end of our visit for further patient instructions/ counseling done pertaining to today's office visit.   Return for chronic f/up in 6 months, and Medicare Wellness ASAP.     Note:  This note was prepared with assistance of Dragon voice recognition software. Occasional wrong-word or sound-a-like substitutions may have occurred due to the inherent limitations of voice recognition software.   This document serves as a record of services personally performed by Mellody Dance, DO. It was created on her behalf by Toni Amend, a trained medical scribe. The creation of this record is based on the scribe's personal observations and the provider's statements to them.   I have reviewed the above medical documentation for accuracy and completeness and I concur.  Mellody Dance, DO  12/14/2018 1:12 PM        --------------------------------------------------------------------------------------------------------------------------------------------------------------------------------------------------------------------------------------------      Subjective:     HPI: [name] is a 73 y.o. female who presents to [place] [facility] today for issues as discussed below.  States emotionally doing well, and "I don't let [COVID] bother me."  Per patient it has been years  since she's had a mammogram, "and it's not that I don't wanna do it, I just don't."  - Specialty care; history of open heart surgery & lung cancer Notes she follows up with Dr. Claiborne Billings of cardiology every year, regularly. Had an appointment with cardiothoracic surgeon Dr. Roxan Hockey near [place]; sees him once yearly. States "that's all I see for that, and that's all I've ever seen."  Confirms all she needed was resection and did not need to see any other specialists.  Does not follow up with OBGYN, Orthopedics, or other specialists.  - Blood Pressure at Home Doesn't take her BP at home.  States "I can usually tell if somethin's funky, and I haven't."  Denies chest pain or SOB.  - Physical Activity Has not been exercising.  States "my exercise is mowing yards."  She says she has two yards; one here and one in [place].  She uses both riding mowers and push mowers.  Only goes walking with friends and family when she's "out."  Says "with age, you acquire hip issues."  Notes it hurts "just when I get up and walk, and then it goes away."  - Prediabetes, weight and wellness Notes "I don't believe I've gone up, or my clothes would be fitting different."   Denies concerns about her diet or blood sugar today.  Notes she has been eating more Pringles lately, but not an enormous amount.  Patient insists that she eats low-carb diet, "rice only with country-style steak, fried potatoes only when [husband] fixes em."  - Smoking Cigarettes She continues smoking.  Says she tries to "maintain at a certain spot, about a pack every other day," a half-pack per day currently.  - Hypothyroidism Continues synthroid as established.  Denies SOB or fatigue, "no more than normal."  - Vitamin D Deficiency Takes her Vitamin D three or four times per week, "not every day."   Wt Readings from Last 3 Encounters:  12/14/18 209 lb 9.6 oz (95.1 kg)  10/27/18 205 lb (93 kg)  08/13/18 205 lb (93 kg)   BP  Readings from Last 3 Encounters:  12/14/18 139/80  10/27/18 134/73  03/02/18 132/70   Pulse Readings from Last 3 Encounters:  12/14/18 77  10/27/18 77  03/02/18 73   BMI Readings from Last 3 Encounters:  12/14/18 35.98 kg/m  10/27/18 35.19 kg/m  08/13/18 35.19 kg/m     Patient Care Team    Relationship Specialty Notifications Start End  Mellody Dance, DO PCP - General Family Medicine  01/16/17   Melrose Nakayama, MD  Cardiothoracic Surgery  12/31/10   Troy Sine, MD  Cardiology  12/31/10      Patient Active Problem List   Diagnosis Date Noted  . Myocardial infarct ([place]) 01/16/2017    Priority: High  . HTN (hypertension)     Priority: High  . Dyslipidemia     Priority: High  . Lung cancer, upper lobe ([place]) 07/09/2011    Priority: Medium  . GERD (gastroesophageal reflux disease)     Priority: Medium  . Morbidly obese ([place]) 05/15/2017  . Glucose intolerance (  impaired glucose tolerance) 05/15/2017  . Tobacco abuse counseling 01/16/2017  . Acquired hypothyroidism 01/16/2017  . Family history of leukemia 01/16/2017  . Palpitations 07/21/2012  . CAD (coronary artery disease)   . Tobacco abuse   . Non-small cell lung cancer ([place]) 08/19/2010    Past Medical history, Surgical history, Family history, Social history, Allergies and Medications have been entered into the medical record, reviewed and changed as needed.    Current Meds  Medication Sig  . amLODipine (NORVASC) 5 MG tablet TAKE 1 TABLET BY MOUTH EVERY DAY  . aspirin 81 MG tablet Take 81 mg by mouth daily.  . Coenzyme Q10 (EQL COQ10) 300 MG CAPS Take by mouth.  . levothyroxine (SYNTHROID) 25 MCG tablet Take 0.5 tablets (12.5 mcg total) by mouth daily. In addition to the 50 mcg.  Patient needs office visit for further refills  . levothyroxine (SYNTHROID) 50 MCG tablet TAKE 1 TABLET BY MOUTH DAILY BEFORE BREAKFAST  . metoprolol succinate (TOPROL-XL) 100 MG 24 hr tablet Take 1 tablet (100 mg total)  by mouth daily.  . Omega-3 Fatty Acids (FISH OIL) 1000 MG CAPS Take 2,000 mg by mouth daily.  . rosuvastatin (CRESTOR) 40 MG tablet TAKE 1 TABLET BY MOUTH EVERY DAY    Allergies:  No Known Allergies   Review of Systems:  A fourteen system review of systems was performed and found to be positive as per HPI.   Objective:   Blood pressure 139/80, pulse 77, temperature 98.4 F (36.9 C), temperature source Oral, resp. rate 12, height 5\' 4"  (1.626 m), weight 209 lb 9.6 oz (95.1 kg), SpO2 94 %. Body mass index is 35.98 kg/m. General:  Well Developed, well nourished, appropriate for stated age.  Neuro:  Alert and oriented,  extra-ocular muscles intact  HEENT:  Normocephalic, atraumatic, neck supple, no carotid bruits appreciated  Skin:  no gross rash, warm, pink. Cardiac:  RRR, S1 S2 Respiratory:  ECTA B/L and A/P, Not using accessory muscles, speaking in full sentences- unlabored. Vascular:  Ext warm, no cyanosis apprec.; cap RF less 2 sec. Psych:  No HI/SI, judgement and insight good, Euthymic mood. Full Affect.

## 2018-12-14 NOTE — Patient Instructions (Signed)
Risk factors for prediabetes and type 2 diabetes  Researchers don't fully understand why some people develop prediabetes and type 2 diabetes and others don't.  It's clear that certain factors increase the risk, however, including:  Weight. The more fatty tissue you have, the more resistant your cells become to insulin.  Inactivity. The less active you are, the greater your risk. Physical activity helps you control your weight, uses up glucose as energy and makes your cells more sensitive to insulin.  Family history. Your risk increases if a parent or sibling has type 2 diabetes.  Race. Although it's unclear why, people of certain races -- including blacks, Hispanics, American Indians and Asian-Americans -- are at higher risk.  Age. Your risk increases as you get older. This may be because you tend to exercise less, lose muscle mass and gain weight as you age. But type 2 diabetes is also increasing dramatically among children, adolescents and younger adults.  Gestational diabetes. If you developed gestational diabetes when you were pregnant, your risk of developing prediabetes and type 2 diabetes later increases. If you gave birth to a baby weighing more than 9 pounds (4 kilograms), you're also at risk of type 2 diabetes.  Polycystic ovary syndrome. For women, having polycystic ovary syndrome -- a common condition characterized by irregular menstrual periods, excess hair growth and obesity -- increases the risk of diabetes.  High blood pressure. Having blood pressure over 140/90 millimeters of mercury (mm Hg) is linked to an increased risk of type 2 diabetes.  Abnormal cholesterol and triglyceride levels. If you have low levels of high-density lipoprotein (HDL), or "good," cholesterol, your risk of type 2 diabetes is higher. Triglycerides are another type of fat carried in the blood. People with high levels of triglycerides have an increased risk of type 2 diabetes. Your doctor can let you know what your  cholesterol and triglyceride levels are.  A good guide to good carbs: The glycemic index ---If you have diabetes, or at risk for diabetes, you know all too well that when you eat carbohydrates, your blood sugar goes up. The total amount of carbs you consume at a meal or in a snack mostly determines what your blood sugar will do. But the food itself also plays a role. A serving of white rice has almost the same effect as eating pure table sugar -- a quick, high spike in blood sugar. A serving of lentils has a slower, smaller effect.  ---Picking good sources of carbs can help you control your blood sugar and your weight. Even if you don't have diabetes, eating healthier carbohydrate-rich foods can help ward off a host of chronic conditions, from heart disease to various cancers to, well, diabetes.  ---One way to choose foods is with the glycemic index (GI). This tool measures how much a food boosts blood sugar.  The glycemic index rates the effect of a specific amount of a food on blood sugar compared with the same amount of pure glucose. A food with a glycemic index of 28 boosts blood sugar only 28% as much as pure glucose. One with a GI of 95 acts like pure glucose.    High glycemic foods result in a quick spike in insulin and blood sugar (also known as blood glucose).  Low glycemic foods have a slower, smaller effect- these are healthier for you.   Using the glycemic index Using the glycemic index is easy: choose foods in the low GI category instead of those in the high  GI category (see below), and go easy on those in between. Low glycemic index (GI of 55 or less): Most fruits and vegetables, beans, minimally processed grains, pasta, low-fat dairy foods, and nuts.  Moderate glycemic index (GI 56 to 69): White and sweet potatoes, corn, white rice, couscous, breakfast cereals such as Cream of Wheat and Mini Wheats.  High glycemic index (GI of 70 or higher): White bread, rice cakes, most crackers,  bagels, cakes, doughnuts, croissants, most packaged breakfast cereals. You can see the values for 100 commons foods and get links to more at www.health.CheapToothpicks.si.  Swaps for lowering glycemic index  Instead of this high-glycemic index food Eat this lower-glycemic index food  White rice Brown rice or converted rice  Instant oatmeal Steel-cut oats  Cornflakes Bran flakes  Baked potato Pasta, bulgur  White bread Whole-grain bread  Corn Peas or leafy greens       Prediabetes Eating Plan  Prediabetes--also called impaired glucose tolerance or impaired fasting glucose--is a condition that causes blood sugar (blood glucose) levels to be higher than normal. Following a healthy diet can help to keep prediabetes under control. It can also help to lower the risk of type 2 diabetes and heart disease, which are increased in people who have prediabetes. Along with regular exercise, a healthy diet:  Promotes weight loss.  Helps to control blood sugar levels.  Helps to improve the way that the body uses insulin.   WHAT DO I NEED TO KNOW ABOUT THIS EATING PLAN?   Use the glycemic index (GI) to plan your meals. The index tells you how quickly a food will raise your blood sugar. Choose low-GI foods. These foods take a longer time to raise blood sugar.  Pay close attention to the amount of carbohydrates in the food that you eat. Carbohydrates increase blood sugar levels.  Keep track of how many calories you take in. Eating the right amount of calories will help you to achieve a healthy weight. Losing about 7 percent of your starting weight can help to prevent type 2 diabetes.  You may want to follow a Mediterranean diet. This diet includes a lot of vegetables, lean meats or fish, whole grains, fruits, and healthy oils and fats.   WHAT FOODS CAN I EAT?  Grains Whole grains, such as whole-wheat or whole-grain breads, crackers, cereals, and pasta. Unsweetened oatmeal. Bulgur. Barley.  Quinoa. Brown rice. Corn or whole-wheat flour tortillas or taco shells. Vegetables Lettuce. Spinach. Peas. Beets. Cauliflower. Cabbage. Broccoli. Carrots. Tomatoes. Squash. Eggplant. Herbs. Peppers. Onions. Cucumbers. Brussels sprouts. Fruits Berries. Bananas. Apples. Oranges. Grapes. Papaya. Mango. Pomegranate. Kiwi. Grapefruit. Cherries. Meats and Other Protein Sources Seafood. Lean meats, such as chicken and [place] or lean cuts of pork and beef. Tofu. Eggs. Nuts. Beans. Dairy Low-fat or fat-free dairy products, such as yogurt, cottage cheese, and cheese. Beverages Water. Tea. Coffee. Sugar-free or diet soda. Seltzer water. Milk. Milk alternatives, such as soy or almond milk. Condiments Mustard. Relish. Low-fat, low-sugar ketchup. Low-fat, low-sugar barbecue sauce. Low-fat or fat-free mayonnaise. Sweets and Desserts Sugar-free or low-fat pudding. Sugar-free or low-fat ice cream and other frozen treats. Fats and Oils Avocado. Walnuts. Olive oil. The items listed above may not be a complete list of recommended foods or beverages. Contact your dietitian for more options.    WHAT FOODS ARE NOT RECOMMENDED?  Grains Refined white flour and flour products, such as bread, pasta, snack foods, and cereals. Beverages Sweetened drinks, such as sweet iced tea and soda. Sweets and Desserts  Baked goods, such as cake, cupcakes, pastries, cookies, and cheesecake. The items listed above may not be a complete list of foods and beverages to avoid. Contact your dietitian for more information.   This information is not intended to replace advice given to you by your health care provider. Make sure you discuss any questions you have with your health care provider.   Document Released: 06/21/2014 Document Reviewed: 06/21/2014 Elsevier Interactive Patient Education [employer].

## 2018-12-16 DIAGNOSIS — Z961 Presence of intraocular lens: Secondary | ICD-10-CM | POA: Diagnosis not present

## 2018-12-16 DIAGNOSIS — H18593 Other hereditary corneal dystrophies, bilateral: Secondary | ICD-10-CM | POA: Diagnosis not present

## 2018-12-16 DIAGNOSIS — H40013 Open angle with borderline findings, low risk, bilateral: Secondary | ICD-10-CM | POA: Diagnosis not present

## 2018-12-17 ENCOUNTER — Other Ambulatory Visit: Payer: Self-pay | Admitting: Cardiovascular Disease

## 2019-01-18 ENCOUNTER — Other Ambulatory Visit: Payer: Self-pay

## 2019-01-18 DIAGNOSIS — I10 Essential (primary) hypertension: Secondary | ICD-10-CM

## 2019-01-18 DIAGNOSIS — Z951 Presence of aortocoronary bypass graft: Secondary | ICD-10-CM

## 2019-01-18 DIAGNOSIS — E785 Hyperlipidemia, unspecified: Secondary | ICD-10-CM

## 2019-01-22 ENCOUNTER — Other Ambulatory Visit: Payer: Self-pay

## 2019-01-22 DIAGNOSIS — I10 Essential (primary) hypertension: Secondary | ICD-10-CM | POA: Diagnosis not present

## 2019-01-22 DIAGNOSIS — Z951 Presence of aortocoronary bypass graft: Secondary | ICD-10-CM

## 2019-01-22 DIAGNOSIS — E785 Hyperlipidemia, unspecified: Secondary | ICD-10-CM

## 2019-01-22 LAB — CBC
Hematocrit: 46.9 % — ABNORMAL HIGH (ref 34.0–46.6)
Hemoglobin: 15.4 g/dL (ref 11.1–15.9)
MCH: 29.3 pg (ref 26.6–33.0)
MCHC: 32.8 g/dL (ref 31.5–35.7)
MCV: 89 fL (ref 79–97)
Platelets: 203 10*3/uL (ref 150–450)
RBC: 5.25 x10E6/uL (ref 3.77–5.28)
RDW: 13.1 % (ref 11.7–15.4)
WBC: 9 10*3/uL (ref 3.4–10.8)

## 2019-01-22 LAB — TSH: TSH: 4.68 u[IU]/mL — ABNORMAL HIGH (ref 0.450–4.500)

## 2019-01-23 LAB — COMPREHENSIVE METABOLIC PANEL
ALT: 31 IU/L (ref 0–32)
AST: 23 IU/L (ref 0–40)
Albumin/Globulin Ratio: 1.5 (ref 1.2–2.2)
Albumin: 4.1 g/dL (ref 3.7–4.7)
Alkaline Phosphatase: 74 IU/L (ref 39–117)
BUN/Creatinine Ratio: 15 (ref 12–28)
BUN: 12 mg/dL (ref 8–27)
Bilirubin Total: 0.3 mg/dL (ref 0.0–1.2)
CO2: 23 mmol/L (ref 20–29)
Calcium: 9.6 mg/dL (ref 8.7–10.3)
Chloride: 104 mmol/L (ref 96–106)
Creatinine, Ser: 0.79 mg/dL (ref 0.57–1.00)
GFR calc Af Amer: 86 mL/min/{1.73_m2} (ref >59)
GFR calc non Af Amer: 75 mL/min/{1.73_m2} (ref >59)
Globulin, Total: 2.8 g/dL (ref 1.5–4.5)
Glucose: 78 mg/dL (ref 65–99)
Potassium: 5.4 mmol/L — ABNORMAL HIGH (ref 3.5–5.2)
Sodium: 144 mmol/L (ref 134–144)
Total Protein: 6.9 g/dL (ref 6.0–8.5)

## 2019-01-23 LAB — LIPID PANEL
Chol/HDL Ratio: 2.8 ratio (ref 0.0–4.4)
Cholesterol, Total: 115 mg/dL (ref 100–199)
HDL: 41 mg/dL (ref >39)
LDL Chol Calc (NIH): 53 mg/dL (ref 0–99)
Triglycerides: 116 mg/dL (ref 0–149)
VLDL Cholesterol Cal: 21 mg/dL (ref 5–40)

## 2019-01-27 ENCOUNTER — Other Ambulatory Visit: Payer: Self-pay

## 2019-01-27 ENCOUNTER — Encounter: Payer: Self-pay | Admitting: Family Medicine

## 2019-01-27 ENCOUNTER — Ambulatory Visit (INDEPENDENT_AMBULATORY_CARE_PROVIDER_SITE_OTHER): Payer: PPO | Admitting: Family Medicine

## 2019-01-27 VITALS — BP 124/75 | HR 79

## 2019-01-27 DIAGNOSIS — Z716 Tobacco abuse counseling: Secondary | ICD-10-CM | POA: Diagnosis not present

## 2019-01-27 DIAGNOSIS — C3492 Malignant neoplasm of unspecified part of left bronchus or lung: Secondary | ICD-10-CM | POA: Diagnosis not present

## 2019-01-27 DIAGNOSIS — Z1159 Encounter for screening for other viral diseases: Secondary | ICD-10-CM

## 2019-01-27 DIAGNOSIS — C3412 Malignant neoplasm of upper lobe, left bronchus or lung: Secondary | ICD-10-CM | POA: Diagnosis not present

## 2019-01-27 DIAGNOSIS — C3410 Malignant neoplasm of upper lobe, unspecified bronchus or lung: Secondary | ICD-10-CM | POA: Diagnosis not present

## 2019-01-27 DIAGNOSIS — Z7189 Other specified counseling: Secondary | ICD-10-CM | POA: Insufficient documentation

## 2019-01-27 DIAGNOSIS — E2839 Other primary ovarian failure: Secondary | ICD-10-CM

## 2019-01-27 DIAGNOSIS — I219 Acute myocardial infarction, unspecified: Secondary | ICD-10-CM

## 2019-01-27 DIAGNOSIS — Z23 Encounter for immunization: Secondary | ICD-10-CM

## 2019-01-27 DIAGNOSIS — E039 Hypothyroidism, unspecified: Secondary | ICD-10-CM

## 2019-01-27 DIAGNOSIS — Z1231 Encounter for screening mammogram for malignant neoplasm of breast: Secondary | ICD-10-CM

## 2019-01-27 DIAGNOSIS — Z720 Tobacco use: Secondary | ICD-10-CM | POA: Diagnosis not present

## 2019-01-27 DIAGNOSIS — Z780 Asymptomatic menopausal state: Secondary | ICD-10-CM

## 2019-01-27 DIAGNOSIS — Z0000 Encounter for general adult medical examination without abnormal findings: Secondary | ICD-10-CM | POA: Diagnosis not present

## 2019-01-27 DIAGNOSIS — Z2821 Immunization not carried out because of patient refusal: Secondary | ICD-10-CM | POA: Insufficient documentation

## 2019-01-27 DIAGNOSIS — Z114 Encounter for screening for human immunodeficiency virus [HIV]: Secondary | ICD-10-CM

## 2019-01-27 MED ORDER — SHINGRIX 50 MCG/0.5ML IM SUSR: 0.5000 mL | Freq: Once | INTRAMUSCULAR | 0 refills | Status: AC

## 2019-01-27 MED ORDER — TETANUS-DIPHTH-ACELL PERTUSSIS 5-2.5-18.5 LF-MCG/0.5 IM SUSP: 0.5000 mL | Freq: Once | INTRAMUSCULAR | 0 refills | Status: AC

## 2019-01-27 MED ORDER — LEVOTHYROXINE SODIUM 50 MCG PO TABS: ORAL_TABLET | ORAL | 1 refills | Status: DC

## 2019-01-27 MED ORDER — PNEUMOVAX 23 25 MCG/0.5ML IJ INJ: 0.5000 mL | INJECTION | INTRAMUSCULAR | 0 refills | Status: AC

## 2019-01-27 NOTE — Progress Notes (Signed)
Subjective:   [name] is a 73 y.o. female who presents for Medicare Annual (Subsequent) preventive examination.   HPI:  - Oldest son lives with her; they have two dogs in the house, an [place] and a 70 lb mixed breed.    - Still smokes even with AMI s/p CABG AND dx of lung CA. She continues smoking. She has not cut back on cigarette use, "no more than I have already."  - Says she has no desire to quit; "not really."   Lung CA: -  Per patient, continues to obtain repeat yrly CT scans through oncologist because she continues smoking.    Hearing concerns:  States her husband is a Optometrist" and he has a hearing problem.   Notes he has ringing in his ears from working around heavy machinery. Notes she can hear everyone else fine, but "when it comes to him, he has a very low, or very high; he doesn't have an in-between."  - Patient denies hearing concerns aside from "if my husband would speak normal, I would hear him; that's just the way he is."    - Gets yrly eye exams. Patient states she has been to the opthalmologist recently.    Hypothyroidism:   Notes she's been taking her levothyroxine as prescribed by Dr. Claiborne Billings, but needs a refill today. Notes she will be spending time with family this Christmas, and is still is choosing not to follow CDC guidelines regarding COVID-19.    Review of Systems:  General:   Denies fever, chills, unexplained weight loss.  Optho/Auditory:   Denies visual changes, blurred vision/LOV Respiratory:   Denies SOB, DOE more than baseline levels.  Cardiovascular:   Denies chest pain, palpitations, new onset peripheral edema  Gastrointestinal:   Denies nausea, vomiting, diarrhea.  Genitourinary: Denies dysuria, freq/ urgency, flank pain or discharge from genitals.  Endocrine:     Denies hot or cold intolerance, polyuria, polydipsia. Musculoskeletal:   Denies unexplained myalgias, joint swelling, unexplained arthralgias, gait problems.  Skin:   Denies rash, suspicious lesions Neurological:     Denies dizziness, unexplained weakness, numbness  Psychiatric/Behavioral:   Denies mood changes, suicidal or homicidal ideations, hallucinations     Objective:    Vitals: BP 124/75   Pulse 79   There is no height or weight on file to calculate BMI.  Advanced Directives 01/16/2017  Does Patient Have a Medical Advance Directive? Yes  Type of Advance Directive Living will    Tobacco Social History   Tobacco Use  Smoking Status Current Every Day Smoker  . Packs/day: 0.50  . Years: 45.00  . Pack years: 22.50  . Types: Cigarettes  Smokeless Tobacco Never Used  Tobacco Comment   on and off since lobectomy     Ready to quit: Not Answered Counseling given: Not Answered Comment: on and off since lobectomy     Past Medical History:  Diagnosis Date  . CAD (coronary artery disease)   . Cancer ([place])   . Dyslipidemia   . GERD (gastroesophageal reflux disease)   . HTN (hypertension)   . Myocardial infarct ([place])   . Non-small cell lung cancer ([place]) 08/2010   Stage IA, status post left upper lobectomy July 2012  . Tobacco abuse    Past Surgical History:  Procedure Laterality Date  . CARDIAC CATHETERIZATION  07/06/2010   see CABG report - pt sent to OR  . CARDIOVASCULAR STRESS TEST  09/11/2010   R/S MV - normal perfusion  in all regions, EF 46%, no scintigraphic evidence of inducible myocardial ischemia; global LV systolic function mildly reduced; no significant wall motion abnormalities noted; Exercise capacity 7 METS; EKG negative for ischemia; low risk study, no signifcant change from previous study 08/2003  . CORONARY ARTERY BYPASS GRAFT  07/11/2010   LIMA to LAD; SVG to 2nd branch OM; SVG to posterior descending artery  . LEFT VATS  09/17/2010  . TEE WITHOUT CARDIOVERSION  07/06/2010   during emergent CABG surgery; 2-3+ mitral regurgitation   Family History  Problem Relation Age of Onset  . Hypothyroidism Father   . Heart  attack Father   . Aplastic anemia Maternal Grandfather   . Stroke Paternal Grandfather    Social History   Socioeconomic History  . Marital status: Married    Spouse name: Not on file  . Number of children: Not on file  . Years of education: Not on file  . Highest education level: Not on file  Occupational History  . Not on file  Social Needs  . Financial resource strain: Not on file  . Food insecurity    Worry: Not on file    Inability: Not on file  . Transportation needs    Medical: Not on file    Non-medical: Not on file  Tobacco Use  . Smoking status: Current Every Day Smoker    Packs/day: 0.50    Years: 45.00    Pack years: 22.50    Types: Cigarettes  . Smokeless tobacco: Never Used  . Tobacco comment: on and off since lobectomy  Substance and Sexual Activity  . Alcohol use: No    Alcohol/week: 0.0 standard drinks  . Drug use: No  . Sexual activity: Never  Lifestyle  . Physical activity    Days per week: Not on file    Minutes per session: Not on file  . Stress: Not on file  Relationships  . Social Herbalist on phone: Not on file    Gets together: Not on file    Attends religious service: Not on file    Active member of club or organization: Not on file    Attends meetings of clubs or organizations: Not on file    Relationship status: Not on file  Other Topics Concern  . Not on file  Social History Narrative  . Not on file    Outpatient Encounter Medications as of 01/27/2019  Medication Sig  . amLODipine (NORVASC) 5 MG tablet TAKE 1 TABLET BY MOUTH EVERY DAY  . aspirin 81 MG tablet Take 81 mg by mouth daily.  . Coenzyme Q10 (EQL COQ10) 300 MG CAPS Take by mouth.  . levothyroxine (SYNTHROID) 50 MCG tablet TAKE 1 TABLET BY MOUTH DAILY BEFORE BREAKFAST  . metoprolol succinate (TOPROL-XL) 100 MG 24 hr tablet Take 1 tablet (100 mg total) by mouth daily.  . Omega-3 Fatty Acids (FISH OIL) 1000 MG CAPS Take 2,000 mg by mouth daily.  . rosuvastatin  (CRESTOR) 40 MG tablet TAKE 1 TABLET BY MOUTH EVERY DAY  . Vitamin D, Ergocalciferol, (DRISDOL) 1.25 MG (50000 UT) CAPS capsule Take one tablet wkly  . [DISCONTINUED] levothyroxine (SYNTHROID) 50 MCG tablet TAKE 1 TABLET BY MOUTH DAILY BEFORE BREAKFAST  . pneumococcal 23 valent vaccine (PNEUMOVAX 23) 25 MCG/0.5ML injection Inject 0.5 mLs into the muscle tomorrow at 10 am for 1 dose.  . Tdap (BOOSTRIX) 5-2.5-18.5 LF-MCG/0.5 injection Inject 0.5 mLs into the muscle once for 1 dose.  Marland Kitchen Zoster Vaccine  Adjuvanted [facility]) injection Inject 0.5 mLs into the muscle once for 1 dose.  . [DISCONTINUED] levothyroxine (SYNTHROID) 25 MCG tablet Take 0.5 tablets (12.5 mcg total) by mouth daily. In addition to the 50 mcg.  Patient needs office visit for further refills (Patient not taking: Reported on 01/27/2019)   No facility-administered encounter medications on file as of 01/27/2019.     Activities of Daily Living In your present state of health, do you have any difficulty performing the following activities: 01/27/2019 08/13/2018  Hearing? Y N  Vision? N N  Difficulty concentrating or making decisions? N N  Walking or climbing stairs? N N  Dressing or bathing? N N  Doing errands, shopping? N N  Some recent data might be hidden    Patient Care Team: Mellody Dance, DO as PCP - General (Family Medicine) Melrose Nakayama, MD (Cardiothoracic Surgery) Troy Sine, MD (Cardiology) Wonda Horner, MD as Consulting Physician (Gastroenterology)    Assessment:   This is a routine wellness examination for [facility].  Exercise Activities and Dietary recommendations    Goals   None     Fall Risk Fall Risk  01/27/2019 08/13/2018 05/15/2017 01/16/2017  Falls in the past year? 0 0 No No  Number falls in past yr: 0 - - -  Injury with Fall? 0 - - -  Follow up Falls evaluation completed Falls evaluation completed - -   Is the patient's home free of loose throw rugs in walkways, pet beds, electrical  cords, etc?   yes      Grab bars in the bathroom? no      Handrails on the stairs?   no      Adequate lighting?   yes  Timed Get Up and Go performed:   Depression Screen PHQ 2/9 Scores 01/27/2019 12/14/2018 08/13/2018 05/15/2017  PHQ - 2 Score 2 0 0 1  PHQ- 9 Score 2 0 0 1     Cognitive Function     6CIT Screen 01/27/2019  What Year? 0 points  What month? 0 points  What time? 0 points  Count back from 20 0 points  Months in reverse 0 points  Repeat phrase 0 points  Total Score 0     There is no immunization history on file for this patient.  Qualifies for Shingles Vaccine? Ordered today Pneumococcal Vaccine: Ordered today Influenza Vaccine: Patient declined Tdap Vaccine: Patient declined  Screening Tests Health Maintenance  Topic Date Due  . INFLUENZA VACCINE  05/19/2019 (Originally 09/19/2018)  . MAMMOGRAM  08/13/2019 (Originally 11/04/1996)  . DEXA SCAN  08/13/2019 (Originally 11/05/2011)  . TETANUS/TDAP  08/13/2019 (Originally [birth year]04/1965)  . Hepatitis C Screening  08/13/2019 (Originally October 29, 1945)  . PNA vac Low Risk Adult (1 of 2 - PCV13) 08/13/2019 (Originally 11/05/2011)  . COLONOSCOPY  11/11/2020    Cancer Screenings: Lung: Low Dose CT Chest recommended if Age 50-80 years, 30 pack-year currently smoking OR have quit w/in 15years. Patient does qualify. Had done 10/23/2018 Breast:  Up to date on Mammogram? Yes : Ordered today Up to date of Bone Density/Dexa? Yes : Ordered today  Colorectal: UTD - 11/12/2010  Additional Screenings:  Hepatitis C Screening: Placed future order HIV Screening: Placed future order     Plan:    1. Encounter for Medicare annual wellness exam  2. Influenza vaccination declined Educated patient but patient declines despite strong efforts to encourage patient to obtain  3. Encounter for screening mammogram for malignant neoplasm of breast -Educated patient this  needs to be done yearly.  She has not had this done "in many, many years  ". - MM Digital Screening; Future  4. Estrogen deficiency - DG Bone Density; Future  5. Post-menopausal - DG Bone Density; Future  6. Encounter for hepatitis C screening test for low risk patient - Hepatitis C Antibody; Future  7. Screening for HIV (human immunodeficiency virus) - HIV antibody (with reflex); Future  8. Need for pneumococcal vaccination - pneumococcal 23 valent vaccine (PNEUMOVAX 23) 25 MCG/0.5ML injection; Inject 0.5 mLs into the muscle tomorrow at 10 am for 1 dose.  Dispense: 0.5 mL; Refill: 0  9. Need for Tdap vaccination - Tdap ([place]) 5-2.5-18.5 LF-MCG/0.5 injection; Inject 0.5 mLs into the muscle once for 1 dose.  Dispense: 0.5 mL; Refill: 0  10. Need for shingles vaccine - Zoster Vaccine Adjuvanted [facility]) injection; Inject 0.5 mLs into the muscle once for 1 dose.  Dispense: 0.5 mL; Refill: 0   PLAN:   - Need for labs, screenings, and immunizations reviewed with patient today.   - Discussed labs obtained by [place] in June of 2020, and some drawn on January 22, 2019.   - Last colonoscopy obtained in 2012, WNL. Per patient, does not remember f/up instructions. - Recommended that patient call gastroenterology-[facility] doctor [place] and ask when repeat colonoscopy is recommended. - Patient knows to follow directions of gastroenterology as advised for 5-10 year repeat.  -Explained to patient that discharge instructions, procedure note etc. did not state when to repeat and with family history of colonic polyps and her personal history of lung cancer, they may want to do it more often than every 10 years even though she had a normal colonoscopy despite finding diverticula  - Need for mammogram. Yearly screening recommended.    - Need for shingrix and pneumonia vaccinations. See orders.   - Due for bone density DEXA scan. See orders.   - Discussed need for updated TDAP vaccination every 5 to 10 years. - Patient agrees to update TDAP vaccination as needed.   -  Encouraged patient to monitor for vision or hearing loss. - Patient knows to follow up with opthalmology for vision yrly, and hearing tests as she deems she needs.  Declines need today   - EXTENSIVE Education and health counseling provided, and all questions answered.   - Prudent follow-up for chronic health maintenance discussed with patient today.  -------------------------------------------------------  COVID counseling performed in addition to others:  - - Novel Covid -19 counseling done; all questions were answered.   - Current CDC / federal and [place] guidelines reviewed with patient --->  Pt states "she can't believe a word CDC says and that this virus thing is made up. - Reminded pt of extreme importance of social distancing; wearing a mask when out in public; insensate handwashing and cleaning of surfaces, avoiding unnecessary trips for shopping and avoiding ALL but emergency appts etc. --> However, patient states she will not wear a mask and does not believe there is any threat. --> Explained to patient if she gets Covid it will likely be an extremely poor outcome and possibly even death. - Told patient to be prepared, not scared; and be smart for the sake of others   11. Myocardial infarction, unspecified MI type, unspecified artery ([place]) -Patient strongly encouraged to adopt healthier lifestyle due to her significant medical history  12. Malignant neoplasm of upper lobe of left lung ([place]) - Patient with history of lung cancer; last CT chest obtained  10/23/2018. - Discussed that oncology will continue monitoring & management. - Current protocol involves obtaining once-yearly screening.  13. Non-small cell cancer of left lung ([place]) See above -Need to quit smoking  14. Tobacco abuse counseling 76min counseling performed separate from addditional items discussed today  15. Tobacco abuse -Education given.  Explained to patient it is best she get American Heart Association or the  American Cancer Society to read about smoking sensation. -Patient tells me she has no desire to quit and does not plan to do anything about it  16. Acquired hypothyroidism - Refill of levothyroxine provided today. See med list.   - Explained to patient recent TSH obtained on December 4 was slightly elevated so we added on additional thyroid labs.   -  Per patient she has been consistent with her thyroid dose and apparently this was recently modified by her cardiologist Dr. Claiborne Billings.  See below.  - T3; Future - T4, free; Future   Orders Placed This Encounter  Procedures  . MM Digital Screening  . DG Bone Density  . Hepatitis C Antibody  . HIV antibody (with reflex)  . T3  . T4, free    I have personally reviewed and noted the following in the patient's chart:   . Medical and social history . Use of alcohol, tobacco or illicit drugs  . Current medications and supplements . Functional ability and status . Nutritional status . Physical activity . Advanced directives . List of other physicians . Hospitalizations, surgeries, and ER visits in previous 12 months . Vitals . Screenings to include cognitive, depression, and falls . Referrals and appointments  In addition, I have reviewed and discussed with patient certain preventive protocols, quality metrics, and best practice recommendations. A written personalized care plan for preventive services as well as general preventive health recommendations were provided to patient.   Diego Cory Lizbett Garciagarcia, D.O.

## 2019-01-28 ENCOUNTER — Telehealth: Payer: Self-pay | Admitting: Family Medicine

## 2019-01-28 MED ORDER — LEVOTHYROXINE SODIUM 50 MCG PO TABS: ORAL_TABLET | ORAL | 1 refills | Status: DC

## 2019-01-28 NOTE — Telephone Encounter (Signed)
Synthroid was sent to [facility] and should have been sent to [facility] [address].  Can you please res[facility] [address].

## 2019-01-28 NOTE — Telephone Encounter (Signed)
Left patient a message to notify [facility] [address], CMA

## 2019-02-02 ENCOUNTER — Ambulatory Visit: Payer: PPO | Admitting: Cardiovascular Disease

## 2019-02-02 ENCOUNTER — Encounter: Payer: Self-pay | Admitting: Cardiovascular Disease

## 2019-02-02 ENCOUNTER — Other Ambulatory Visit: Payer: Self-pay

## 2019-02-02 DIAGNOSIS — Z951 Presence of aortocoronary bypass graft: Secondary | ICD-10-CM

## 2019-02-02 DIAGNOSIS — Z85118 Personal history of other malignant neoplasm of bronchus and lung: Secondary | ICD-10-CM | POA: Diagnosis not present

## 2019-02-02 DIAGNOSIS — I2583 Coronary atherosclerosis due to lipid rich plaque: Secondary | ICD-10-CM | POA: Diagnosis not present

## 2019-02-02 DIAGNOSIS — E668 Other obesity: Secondary | ICD-10-CM | POA: Diagnosis not present

## 2019-02-02 DIAGNOSIS — E039 Hypothyroidism, unspecified: Secondary | ICD-10-CM | POA: Diagnosis not present

## 2019-02-02 DIAGNOSIS — E785 Hyperlipidemia, unspecified: Secondary | ICD-10-CM

## 2019-02-02 DIAGNOSIS — I2510 Atherosclerotic heart disease of native coronary artery without angina pectoris: Secondary | ICD-10-CM

## 2019-02-02 DIAGNOSIS — I252 Old myocardial infarction: Secondary | ICD-10-CM

## 2019-02-02 DIAGNOSIS — Z720 Tobacco use: Secondary | ICD-10-CM | POA: Diagnosis not present

## 2019-02-02 NOTE — Patient Instructions (Signed)
Medication Instructions:  Your physician recommends that you continue on your current medications as directed. Please refer to the Current Medication list given to you today. *If you need a refill on your cardiac medications before your next appointment, please call your pharmacy*  Lab Work: None  If you have labs (blood work) drawn today and your tests are completely normal, you will receive your results only by: Marland Kitchen MyChart Message (if you have MyChart) OR . A paper copy in the mail If you have any lab test that is abnormal or we need to change your treatment, we will call you to review the results.  Testing/Procedures: Your physician has requested that you have an echocardiogram. Echocardiography is a painless test that uses sound waves to create images of your heart. It provides your doctor with information about the size and shape of your heart and how well your heart's chambers and valves are working. This procedure takes approximately one hour. There are no restrictions for this procedure. SCHEDULE ECHO IN 6 MONTHS  TEST WILL BE COMPLETED AT [place] [address]  Follow-Up: At [facility], you and your health needs are our priority.  As part of our continuing mission to provide you with exceptional heart care, we have created designated Provider Care Teams.  These Care Teams include your primary Cardiologist (physician) and Advanced Practice Providers (APPs -  Physician Assistants and Nurse Practitioners) who all work together to provide you with the care you need, when you need it.  Your next appointment:   6 month(s)  The format for your next appointment:   In Person  Provider:   Shelva Majestic, MD  Other Instructions

## 2019-02-02 NOTE — Progress Notes (Signed)
Patient ID: [name], female   DOB: 11-09-1945, 73 y.o.   MRN: [id number] n       HPI: [name] is a 73 y.o. female who presents to the office today for an 11 month follow up cardiology evaluation.  [name] suffered initial anterior wall myocardial infarction with ventricular fibrillation arrest requiring resuscitation and PTCA of her LAD in 1997. On 07/05/2010 she presented with increasing shortness of breath and was found to have critical life threatening anatomy with 99% left main equivalent disease.   Later that afternoon she underwent emergent CABG revascularization surgery by Dr. Roxan Hockey with a LIMA to the LAD, a vein graft to the second branch of the obtuse marginal vessel, and vein graft to the PDA.  Chest x-rays disclosed a left upper lung nodule and ultimately this proved to be adenocarcinoma for which she ultimately underwent upper lobe resection in July 2012. Additional problems include hypertension, hyperlipidemia, obesity and probable metabolic syndrome. A nuclear perfusion study in July 2012 showed a post stress ejection fraction of 46% but otherwise fairly normal perfusion. EF at cath was 35% prior to surgery.   In December 2013 an NMR lipoprofile revealed that despite an LDL of 50 she had increased LDL particle #1312, and increased small LDL particle number at 761. Her insulin resistance was significantly elevated at 70. HDL particle number was low at 26.8 and a triglyceride of 152.evaluation.  An echo Doppler study on 08/04/2012 showed an ejection fraction at 50 - 55%. She had mild LVH. There was grade 1 diastolic dysfunction with distal anterior, anteroseptal and apical LAD scar. There is mild tricuspid regurgitation and mild mitral regurgitation. PA pressure estimated 32 mm.  A LexiScan study in 07/2012 was low risk. Ejection fraction was 56%. There is no evidence for ischemia. She had laboratory which showed an LDL particle number at 830 with a target LDL of 38  HDL 42 triglycerides 170. Her insulin resistance was elevated at 60. Hemoglobin A1c was 5.7.  When I last saw her in October 2017 she was still smoking one pack of cigarettes every 3 day.  She sees Dr. Roxan Hockey on a yearly basis and  saw him in August 2017..  A follow-up chest CT was reviewed and she was stable without evidence of local recurrence or metastatic disease status post left upper lobectomy for stage IA non-small cell carcinoma.  Over the past year, she denies any episodes of recurrent chest pain or awareness of palpitations.  She underwent a nuclear perfusion study on 11/05/2016, which revealed normal perfusion and function without scar or ischemia. She saw Dr. Roxan Hockey in August 2018 and during that evaluation, she was significantly hypertensive with a blood pressure 170/87.  She was on Toprol-XL 100 mg, rosuvastatin 40 mg, and levothyroxine 50 g.  Unfortunately she still smokes and is smoking anywhere from 1-3/4 of a pack daily.  She had laboratory in May and on one chemistry evaluation.  Her potassium was 5.5, a follow-up evaluation showed normal potassium.  Cholesterol was 123, HDL 39, and LDL 59.    When I saw her in October 2018, I initiated amlodipine 5 mg.  She was undergoing low dose CT imaging for CA.  We again discussed complete smoking cessation.    I saw her in January 2019. Over the prior year she hadshe has been going back and forth between [place] and [place].    She denies any significant shortness of breath.  She has not been very active.  Unfortunately she continues to smoke cigarettes and typically a pack of cigarettes may last 2 to 3 days. She has seen Dr. Roxan Hockey for cardiothoracic surgery follow-up in September 2019.  Laboratory in December 2019 showed an LDL cholesterol at 44, HDL 41, total cholesterol 114 and triglycerides 146.  She continued to be on amlodipine 5 mg and Toprol-XL 100 mg for hypertension and her CAD and  rosuvastatin 40 mg for  hyperlipidemia in addition to omega-3 fatty acid.  She has hypothyroidism on levothyroxine.  She continues to be on daily aspirin.    She was last evaluated in January 2020 at which time she remained stable from a cardiac standpoint.  She had seen Dr. Roxan Hockey in follow-up.  She sees Dr. Raliegh Scarlet for primary care.  She just completed lab work on January 22, 2019.  Lipid studies were excellent with total cholesterol 115, triglycerides 116, HDL 41, and LDL 53.  Renal function was stable with a BUN of 12 and creatinine 0.79.  TSH was borderline increased at 4.68.  CBC was stable.  She remains asymptomatic with reference to chest pain or shortness of breath.  She has not been successful with weight loss.  She presents for evaluation.  Past Medical History:  Diagnosis Date  . CAD (coronary artery disease)   . Cancer ([place])   . Dyslipidemia   . GERD (gastroesophageal reflux disease)   . HTN (hypertension)   . Myocardial infarct ([place])   . Non-small cell lung cancer ([place]) 08/2010   Stage IA, status post left upper lobectomy July 2012  . Tobacco abuse     Past Surgical History:  Procedure Laterality Date  . CARDIAC CATHETERIZATION  07/06/2010   see CABG report - pt sent to OR  . CARDIOVASCULAR STRESS TEST  09/11/2010   R/S MV - normal perfusion in all regions, EF 46%, no scintigraphic evidence of inducible myocardial ischemia; global LV systolic function mildly reduced; no significant wall motion abnormalities noted; Exercise capacity 7 METS; EKG negative for ischemia; low risk study, no signifcant change from previous study 08/2003  . CORONARY ARTERY BYPASS GRAFT  07/11/2010   LIMA to LAD; SVG to 2nd branch OM; SVG to posterior descending artery  . LEFT VATS  09/17/2010  . TEE WITHOUT CARDIOVERSION  07/06/2010   during emergent CABG surgery; 2-3+ mitral regurgitation    No Known Allergies  Current Outpatient Medications  Medication Sig Dispense Refill  . amLODipine (NORVASC) 5 MG tablet TAKE 1  TABLET BY MOUTH EVERY DAY 90 tablet 4  . aspirin 81 MG tablet Take 81 mg by mouth daily.    . Coenzyme Q10 (EQL COQ10) 300 MG CAPS Take by mouth.    . levothyroxine (SYNTHROID) 50 MCG tablet TAKE 1 TABLET BY MOUTH DAILY BEFORE BREAKFAST 90 tablet 1  . metoprolol succinate (TOPROL-XL) 100 MG 24 hr tablet Take 1 tablet (100 mg total) by mouth daily. 90 tablet 3  . Omega-3 Fatty Acids (FISH OIL) 1000 MG CAPS Take 2,000 mg by mouth daily.    . rosuvastatin (CRESTOR) 40 MG tablet TAKE 1 TABLET BY MOUTH EVERY DAY 90 tablet 1  . Vitamin D, Ergocalciferol, (DRISDOL) 1.25 MG (50000 UT) CAPS capsule Take one tablet wkly 12 capsule 3   No current facility-administered medications for this visit.    Also she is married has 3 children 5 grandchildren. She is an ex-smoker. She does not routinely exercise. There is no alcohol use.  ROS General: Negative; No fevers, chills, or night sweats;  positive for purposeful weight loss HEENT: Negative; No changes in vision or hearing, sinus congestion, difficulty swallowing Pulmonary: Negative; No cough, wheezing, shortness of breath, hemoptysis Cardiovascular: Negative; No chest pain, presyncope, syncope, palpitations GI: Negative; No nausea, vomiting, diarrhea, or abdominal pain GU: Negative; No dysuria, hematuria, or difficulty voiding Musculoskeletal: Negative; no myalgias, joint pain, or weakness Hematologic/Oncology: Negative; no easy bruising, bleeding Endocrine: Positive for insulin resistance; no heat/cold intolerance; no diabetes Neuro: Negative; no changes in balance, headaches Skin: Negative; No rashes or skin lesions Psychiatric: Negative; No behavioral problems, depression Sleep: Negative; No snoring, daytime sleepiness, hypersomnolence, bruxism, restless legs, hypnogognic hallucinations, no cataplexy Other comprehensive 14 point system review is negative.   PE BP (!) 142/71   Pulse 74   Temp (!) 96.4 F (35.8 C)   Ht '5\' 4"'  (1.626 m)   Wt  211 lb (95.7 kg)   SpO2 97%   BMI 36.22 kg/m    Repeat blood pressure by me was 118/70 the right arm and 128/68 in the left arm  Wt Readings from Last 3 Encounters:  02/02/19 211 lb (95.7 kg)  12/14/18 209 lb 9.6 oz (95.1 kg)  10/27/18 205 lb (93 kg)   General: Alert, oriented, no distress.  Skin: normal turgor, no rashes, warm and dry HEENT: Normocephalic, atraumatic. Pupils equal round and reactive to light; sclera anicteric; extraocular muscles intact;  Nose without nasal septal hypertrophy Mouth/Parynx benign; Mallinpatti scale 3 Neck: No JVD, no carotid bruits; normal carotid upstroke Lungs: clear to ausculatation and percussion; no wheezing or rales Chest wall: without tenderness to palpitation Heart: PMI not displaced, RRR, s1 s2 normal, 1/6 systolic murmur, no diastolic murmur, no rubs, gallops, thrills, or heaves Abdomen: soft, nontender; no hepatosplenomehaly, BS+; abdominal aorta nontender and not dilated by palpation. Back: no CVA tenderness Pulses 2+ Musculoskeletal: full range of motion, normal strength, no joint deformities Extremities: no clubbing cyanosis or edema, Homan's sign negative  Neurologic: grossly nonfocal; Cranial nerves grossly wnl Psychologic: Normal mood and affect   ECG (independently read by me): Normal sinus rhythm at 74 bpm.  Poor R wave progression with QS complex V1 through V4.  No ectopy.  Normal intervals.  January 2020 ECG (independently read by me): Normal sinus rhythm at 73 bpm.  Anteroseptal QS complex  January 2019 ECG (independently read by me): normal sinus rhythm at 76 bpm.  Q wave in lead 3.  Nonspecific T changes.  October 2018 ECG (independently read by me): Normal sinus rhythm at 73 bpm.  Q wave in 3 and very small Q wave in aVF.  Nonspecific T changes anteriorly.  Normal intervals.  October 2017 ECG (independently read by me): Normal sinus rhythm at 65 bpm.  Nonspecific T changes.  Q wave in lead 3.  October 2016 ECG  (independently read by me): Normal sinus rhythm at 77 bpm.  Nonspecific T changes anteriorly which are old.  Normal intervals.  September 2015 ECG (independently read by me): Normal sinus rhythm at 70 beats per minute.  Poor progression anteriorly.  Small Q-wave in lead 3.  Nonspecific T changes anteriorly, unchanged  04/16/2013 ECG (independently read by me): Normal sinus rhythm at 65 beats per minute. T wave changes V1 through V6, nonspecific, unchanged. QTc interval 455 ms  Prior ECG of October 15 2012: NSR @ 62 bpm with T-wave abnormality anteriorly V1 through V5 unchanged.  LABS: BMP Latest Ref Rng & Units 01/22/2019 08/18/2018 01/22/2018  Glucose 65 - 99 mg/dL 78 98 96  BUN  8 - 27 mg/dL '12 9 12  ' Creatinine 0.57 - 1.00 mg/dL 0.79 0.67 0.77  BUN/Creat Ratio 12 - '28 15 13 16  ' Sodium 134 - 144 mmol/L 144 145(H) 142  Potassium 3.5 - 5.2 mmol/L 5.4(H) 5.1 5.3(H)  Chloride 96 - 106 mmol/L 104 105 103  CO2 20 - 29 mmol/L '23 25 24  ' Calcium 8.7 - 10.3 mg/dL 9.6 9.6 9.7   Hepatic Function Latest Ref Rng & Units 01/22/2019 08/18/2018 01/22/2018  Total Protein 6.0 - 8.5 g/dL 6.9 7.1 6.7  Albumin 3.7 - 4.7 g/dL 4.1 4.5 4.1  AST 0 - 40 IU/L '23 19 23  ' ALT 0 - 32 IU/L '31 27 30  ' Alk Phosphatase 39 - 117 IU/L 74 68 75  Total Bilirubin 0.0 - 1.2 mg/dL 0.3 0.3 0.2   CBC Latest Ref Rng & Units 01/22/2019 08/18/2018 01/22/2018  WBC 3.4 - 10.8 x10E3/uL 9.0 9.0 10.1  Hemoglobin 11.1 - 15.9 g/dL 15.4 15.1 14.8  Hematocrit 34.0 - 46.6 % 46.9(H) 45.2 45.5  Platelets 150 - 450 x10E3/uL 203 218 234   Lab Results  Component Value Date   MCV 89 01/22/2019   MCV 86 08/18/2018   MCV 86 01/22/2018   Lab Results  Component Value Date   TSH 4.680 (H) 01/22/2019   Lab Results  Component Value Date   HGBA1C 6.1 (A) 12/14/2018   Lipid Panel     Component Value Date/Time   CHOL 115 01/22/2019 1208   CHOL 114 07/22/2012 0840   TRIG 116 01/22/2019 1208   TRIG 170 (H) 07/22/2012 0840   HDL 41 01/22/2019 1208    HDL 42 07/22/2012 0840   CHOLHDL 2.8 01/22/2019 1208   CHOLHDL 3.2 06/26/2016 0859   VLDL 25 06/26/2016 0859   LDLCALC 53 01/22/2019 1208   LDLCALC 38 07/22/2012 0840      RADIOLOGY: No results found.  IMPRESSION:  1. History of acute anterior wall MI with VF arrest: 1997   2. Coronary artery disease due to lipid rich plaque   3. Hx of CABG: 2012   4. History of lung cancer   5. Hyperlipidemia with target LDL less than 70   6. Tobacco abuse   7. Hypothyroidism, unspecified type   8. Moderate obesity     ASSESSMENT AND PLAN: [name] is a 73 year old Caucasian female who is 23 years following her initial anterior wall myocardial infarction with VF arrest for which she underwent PTCA emergently of her LAD in 1997. She is 8 1/2 years following emergency CABG surgery for life-threatening anatomy with 99% left main disease and total occlusion of her RCA at the ostium with left to right collaterals. Her ejection fraction did improve and is now 50-55% with small area of subtle LAD scar.  She underwent a follow-up myocardial perfusion study on 11/05/2016, which continued to show normal perfusion without scar or ischemia .  She was fortuitously found to have cancer, stage Ia, non-small cell carcinoma and has been without recurrence.  She continues to be on amlodipine 5 mg and metoprolol succinate 100 mg daily.  Blood pressure today is stable.  She is on rosuvastatin and over-the-counter fish oil for hyperlipidemia.  Lipid studies are excellent with most recent LDL cholesterol at 53.  She continues to be on levothyroxine for hypothyroidism with most recent TSH 4.68.  There is a tobacco history.  Complete smoking cessation is essential.  We discussed the importance of increased exercise and weight loss with her BMI at  36.22.  I will see her in 6 months for follow-up evaluation and prior to that evaluation I have recommended she undergo a 6-year follow-up echo Doppler study.   Time spent: 25  minutes Troy Sine, MD, [facility]  02/02/2019 12:40 PM

## 2019-02-03 ENCOUNTER — Other Ambulatory Visit: Payer: Self-pay

## 2019-02-03 DIAGNOSIS — E039 Hypothyroidism, unspecified: Secondary | ICD-10-CM

## 2019-02-03 MED ORDER — LEVOTHYROXINE SODIUM 25 MCG PO TABS: 12.5000 ug | ORAL_TABLET | Freq: Every day | ORAL | 0 refills | Status: DC

## 2019-02-17 ENCOUNTER — Other Ambulatory Visit: Payer: Self-pay | Admitting: *Deleted

## 2019-02-17 DIAGNOSIS — Z79899 Other long term (current) drug therapy: Secondary | ICD-10-CM

## 2019-02-27 ENCOUNTER — Other Ambulatory Visit: Payer: Self-pay | Admitting: Cardiovascular Disease

## 2019-03-24 ENCOUNTER — Other Ambulatory Visit: Payer: Self-pay | Admitting: Cardiovascular Disease

## 2019-03-26 ENCOUNTER — Telehealth: Payer: Self-pay | Admitting: Family Medicine

## 2019-03-26 ENCOUNTER — Other Ambulatory Visit: Payer: Self-pay | Admitting: Cardiovascular Disease

## 2019-03-26 ENCOUNTER — Other Ambulatory Visit: Payer: Self-pay | Admitting: Family Medicine

## 2019-03-26 DIAGNOSIS — E039 Hypothyroidism, unspecified: Secondary | ICD-10-CM

## 2019-03-26 NOTE — Telephone Encounter (Signed)
Pt called needed to clarify Levothyroxine Rx for Dr. Raliegh Scarlet is 25 MCG tabs (pt takes half   Levothyroxine Rx from Dr. Shelva Majestic which is 80 MCG tabs.  Patient is trying to obtain refill on the [facility] dosage only-- [facility] has advised pt they sent refill request to Dr. Raliegh Scarlet in [place] & have now correctly submitted it to Dr. Evette Georges office for refill.  --Juluis Rainier   --glh

## 2019-03-29 NOTE — Telephone Encounter (Signed)
Noted. AS, CMA

## 2019-03-30 ENCOUNTER — Other Ambulatory Visit: Payer: Self-pay | Admitting: Cardiovascular Disease

## 2019-03-30 DIAGNOSIS — E039 Hypothyroidism, unspecified: Secondary | ICD-10-CM

## 2019-03-30 MED ORDER — LEVOTHYROXINE SODIUM 50 MCG PO TABS: ORAL_TABLET | ORAL | 2 refills | Status: DC

## 2019-03-30 NOTE — Telephone Encounter (Signed)
Rx has been sent to the pharmacy electronically. ° °

## 2019-03-30 NOTE — Telephone Encounter (Signed)
*  STAT* If patient is at the pharmacy, call can be transferred to refill team.   1. Which medications need to be refilled? (please list name of each medication and dose if known) levothyroxine (SYNTHROID) 50 MCG tablet  2. Which pharmacy/location (including street and city if local pharmacy) is medication to be sent to? [id number] - [place], [place] - [address]  3. Do they need a 30 day or 90 day supply? 90 day  Patient is out of the medication

## 2019-04-27 ENCOUNTER — Ambulatory Visit
Admission: RE | Admit: 2019-04-27 | Discharge: 2019-04-27 | Disposition: A | Discharge status: Home / Self Care | Payer: Medicare HMO | Source: Ambulatory Visit | Attending: Family Medicine | Admitting: Family Medicine

## 2019-04-27 ENCOUNTER — Ambulatory Visit
Admission: RE | Admit: 2019-04-27 | Discharge: 2019-04-27 | Disposition: A | Discharge status: Home / Self Care | Payer: PPO | Source: Ambulatory Visit | Attending: Family Medicine | Admitting: Family Medicine

## 2019-04-27 ENCOUNTER — Other Ambulatory Visit: Payer: Self-pay

## 2019-04-27 DIAGNOSIS — E2839 Other primary ovarian failure: Secondary | ICD-10-CM

## 2019-04-27 DIAGNOSIS — Z780 Asymptomatic menopausal state: Secondary | ICD-10-CM | POA: Diagnosis not present

## 2019-04-27 DIAGNOSIS — M85852 Other specified disorders of bone density and structure, left thigh: Secondary | ICD-10-CM | POA: Diagnosis not present

## 2019-04-27 DIAGNOSIS — Z1231 Encounter for screening mammogram for malignant neoplasm of breast: Secondary | ICD-10-CM | POA: Diagnosis not present

## 2019-04-28 ENCOUNTER — Other Ambulatory Visit: Payer: Self-pay | Admitting: Family Medicine

## 2019-04-28 DIAGNOSIS — R928 Other abnormal and inconclusive findings on diagnostic imaging of breast: Secondary | ICD-10-CM

## 2019-05-14 ENCOUNTER — Other Ambulatory Visit: Payer: Self-pay | Admitting: Cardiovascular Disease

## 2019-05-17 DIAGNOSIS — E039 Hypothyroidism, unspecified: Secondary | ICD-10-CM

## 2019-05-20 MED ORDER — LEVOTHYROXINE SODIUM 50 MCG PO TABS: ORAL_TABLET | ORAL | 0 refills | Status: DC

## 2019-05-20 MED ORDER — LEVOTHYROXINE SODIUM 25 MCG PO TABS: 12.5000 ug | ORAL_TABLET | Freq: Every day | ORAL | 0 refills | Status: DC

## 2019-05-25 ENCOUNTER — Other Ambulatory Visit: Payer: Self-pay | Admitting: Family Medicine

## 2019-05-25 ENCOUNTER — Ambulatory Visit
Admission: RE | Admit: 2019-05-25 | Discharge: 2019-05-25 | Disposition: A | Discharge status: Home / Self Care | Payer: Medicare HMO | Source: Ambulatory Visit | Attending: Family Medicine | Admitting: Family Medicine

## 2019-05-25 ENCOUNTER — Other Ambulatory Visit: Payer: Self-pay

## 2019-05-25 DIAGNOSIS — R928 Other abnormal and inconclusive findings on diagnostic imaging of breast: Secondary | ICD-10-CM | POA: Diagnosis not present

## 2019-05-25 DIAGNOSIS — R921 Mammographic calcification found on diagnostic imaging of breast: Secondary | ICD-10-CM

## 2019-05-26 ENCOUNTER — Telehealth: Payer: Self-pay | Admitting: Family Medicine

## 2019-05-26 DIAGNOSIS — Z1231 Encounter for screening mammogram for malignant neoplasm of breast: Secondary | ICD-10-CM

## 2019-05-26 DIAGNOSIS — R921 Mammographic calcification found on diagnostic imaging of breast: Secondary | ICD-10-CM

## 2019-05-26 NOTE — Telephone Encounter (Signed)
Future order for repeat mammogram in 6 months placed per Dr. Raliegh Scarlet. AS, CMA

## 2019-05-26 NOTE — Telephone Encounter (Signed)
-----   Message from Mellody Dance, DO sent at 05/25/2019  9:58 PM EDT ----- Please place future order for 6 mo f/up Mammo due to R breast calcifications. TY

## 2019-06-14 ENCOUNTER — Other Ambulatory Visit: Payer: Self-pay

## 2019-06-14 ENCOUNTER — Ambulatory Visit (INDEPENDENT_AMBULATORY_CARE_PROVIDER_SITE_OTHER): Payer: Medicare HMO | Admitting: Family Medicine

## 2019-06-14 ENCOUNTER — Encounter: Payer: Self-pay | Admitting: Family Medicine

## 2019-06-14 VITALS — BP 111/77 | HR 74 | Temp 97.8°F | Ht 64.0 in | Wt 214.4 lb

## 2019-06-14 DIAGNOSIS — E039 Hypothyroidism, unspecified: Secondary | ICD-10-CM

## 2019-06-14 DIAGNOSIS — I10 Essential (primary) hypertension: Secondary | ICD-10-CM

## 2019-06-14 DIAGNOSIS — Z720 Tobacco use: Secondary | ICD-10-CM

## 2019-06-14 DIAGNOSIS — C3492 Malignant neoplasm of unspecified part of left bronchus or lung: Secondary | ICD-10-CM | POA: Diagnosis not present

## 2019-06-14 DIAGNOSIS — R7302 Impaired glucose tolerance (oral): Secondary | ICD-10-CM | POA: Diagnosis not present

## 2019-06-14 DIAGNOSIS — I219 Acute myocardial infarction, unspecified: Secondary | ICD-10-CM

## 2019-06-14 DIAGNOSIS — Z716 Tobacco abuse counseling: Secondary | ICD-10-CM

## 2019-06-14 DIAGNOSIS — E785 Hyperlipidemia, unspecified: Secondary | ICD-10-CM

## 2019-06-14 NOTE — Progress Notes (Signed)
Impression and Recommendations:    1. Myocardial infarction, unspecified MI type, unspecified artery ([place])   2. Non-small cell cancer of left lung ([place])   3. Tobacco abuse   4. Tobacco abuse counseling   5. Hypertension, unspecified type   6. Dyslipidemia   7. Glucose intolerance (impaired glucose tolerance)   8. Acquired hypothyroidism     - Re-check potassium today as advised by cardiology. Will draw their labs ordered as well as a few additional.   Acquired Hypothyroidism - Per patient, symptomatic at this time.  Reports hair loss.  - TSH last checked 4 months ago. - TSH = 4.680, elevated last check.  - Need for re-check today.  See orders. - Continue management on levothyroxine as established.  See med list.  - Per patient, was having difficulty obtaining [facility].  - Will continue to monitor and await results of lab work to modify treatment plan.   Glucose intolerance (impaired glucose tolerance) - Last A1c obtained six months ago, 6.1, slightly improved since 6.2 ten months ago.  - Counseled patient on prevention of diabetes and discussed dietary and lifestyle modification as first line.    - Importance of low carb, heart-healthy diet discussed with patient in addition to regular aerobic exercise of 60min 5d/week or more.   - Handouts provided at patient's desire and or told to go online at the American Diabetes Association website for further information  - We will continue to monitor   Dyslipidemia - Managed by cardiology.  - Cholesterol levels at goal last check. - Pt will continue current treatment regimen.  See med list.  - Dietary changes such as low saturated & trans fat diets for hyperlipidemia and low carb diets for hypertriglyceridemia discussed with patient.    - Encouraged patient to follow AHA guidelines for regular exercise and also engage in weight loss if BMI above 25.   - We will continue to monitor  alongside cardiology.   Hypertension, unspecified type - meds refilled / Dx Managed by cardiology.  -- ( pt tells me today she prefers Dr Claiborne Billings to prescribe all her meds )  - Blood pressure currently is stable, at goal. - Patient will continue current treatment regimen.  See med list.  - Counseled patient on pathophysiology of disease and discussed various treatment options, which always includes dietary and lifestyle modification as first line.   - Lifestyle changes such as dash and heart healthy diets and engaging in a regular exercise program discussed extensively with patient.   - Ambulatory blood pressure monitoring encouraged at least 3 times weekly.  Keep log and bring in every office visit.  Reminded patient that if they ever feel poorly in any way, to check their blood pressure and pulse.  - We will continue to monitor alongside cardiology.   Myocardial infarction, unspecified MI type, unspecified artery - Monitored by cardiology.  Asx.  Stable at this time. - Continue management as established.  See med list.  - Will continue to monitor alongside cardiology.   Tobacco abuse, Tobacco abuse counseling Told pt to think seriously about quitting smoking!  Told pt it is very important for his/her health and well being.  - Smoking cessation instruction/ counseling given of at least 5 minutes:  counseled patient on the dangers of tobacco use and reviewed strategies to maximize success - Told to call 1-800-QUIT-NOW 4236585362) for free smoking cessation counseling and support, or pt can go online to www.heart.org - the  American Heart Association website and search "quit smoking ".    Non-small cell cancer of left lung - Per patient, stable at this time. - Continues yearly lung CT screens. - ADVISED to quit smoking but pt declines to at this time and "understands how bad it is" for her - Will continue to monitor alongside specialist.   BMI Counseling - Body mass index is  36.8 kg/m Explained to patient what BMI refers to, and what it means medically.    Told patient to think about it as a "medical risk stratification measurement" and how increasing BMI is associated with increasing risk/ or worsening state of various diseases such as hypertension, hyperlipidemia, diabetes, premature OA, depression etc.  American Heart Association guidelines for healthy diet, basically Mediterranean diet, and exercise guidelines of 30 minutes 5 days per week or more discussed in detail.  Health counseling performed.  All questions answered.   Health Counseling & Preventative Maintenance - Advised patient to continue working toward exercising to improve overall mental, physical, and emotional health.    - Reviewed the "spokes of the wheel" of mood and health management.  Stressed the importance of ongoing prudent habits, including regular exercise, appropriate sleep hygiene, healthful dietary habits, and prayer/meditation to relax.  - Encouraged patient to engage in daily physical activity as tolerated, especially a formal exercise routine.  Recommended that the patient eventually strive for at least 150 minutes of moderate cardiovascular activity per week according to guidelines established by the [facility].   - Healthy dietary habits encouraged, including low-carb, and high amounts of lean protein in diet.   - Patient should also consume adequate amounts of water.    Orders Placed This Encounter  Procedures  . Hemoglobin A1c  . TSH    Please see AVS handed out to patient at the end of our visit for further patient instructions/ counseling done pertaining to today's office visit.   Return for 4-11mo or sooner if issues/ concerns.     Note:  This note was prepared with assistance of Dragon voice recognition software. Occasional wrong-word or sound-a-like substitutions may have occurred due to the inherent limitations of voice recognition software.   The [place] was signed into law in 2016 which includes the topic of electronic health records.  This provides immediate access to information in MyChart.  This includes consultation notes, operative notes, office notes, lab results and pathology reports.  If you have any questions about what you read please let us know at your next visit or call us at the office.  We are right here with you.   This case required medical decision making of at least moderate complexity.  This document serves as a record of services personally performed by Mellody Dance, DO. It was created on her behalf by Toni Amend, a trained medical scribe. The creation of this record is based on the scribe's personal observations and the provider's statements to them.   The above documentation from Toni Amend, medical scribe, has been reviewed by Marjory Sneddon, D.O.       --------------------------------------------------------------------------------------------------------------------------------------------------------------------------------------------------------------------------------------------    Subjective:     [name], am serving as scribe for Dr.Teisha Trowbridge.   HPI: [name] is a 74 y.o. female who presents to [place] [facility] today for issues as discussed below.  She has not obtained her COVID-19 vaccination.  When asked if she obtained the COVID-19 vaccination, states "no, and I will not."  -  Synthroid Management Followed by Dr. Claiborne Billings of cardiology.  [facility].  Says she could definitely tell a difference mentally while off of the medication.  She almost ran out of her prescription a month ago.  Notified Dr. Claiborne Billings before she ran out, and was provided a refill for another 90 days.  She has an echocardiogram scheduled for June 8.  Noticed that her hair has been falling  out, and isn't sure if this is related to her thyroid management.  Says she feels her "hair is falling out because it's high."  Notes she has been back on levothyroxine as prescribed for "at least two months."  - Smoking Status She continues smoking.  - Lung Cancer Notes with the lung cancer, "it's all good."  She goes once yearly for CT scan.  HPI:  Hypertension:  Not checking her BP at home.  Says she can usually tell when it's up, because of a "funkiness in the head," and she hasn't had any of that lately.  - Patient reports good compliance with medication and/or lifestyle modification  - Her denies acute concerns or problems related to treatment plan  - She denies new onset of: chest pain, exercise intolerance, shortness of breath, dizziness, visual changes, headache, lower extremity swelling or claudication.   Last 3 blood pressure readings in our office are as follows: BP Readings from Last 3 Encounters:  06/14/19 111/77  02/02/19 (!) 142/71  01/27/19 124/75   Filed Weights   06/14/19 0929  Weight: 214 lb 6.4 oz (97.3 kg)      Wt Readings from Last 3 Encounters:  06/14/19 214 lb 6.4 oz (97.3 kg)  02/02/19 211 lb (95.7 kg)  12/14/18 209 lb 9.6 oz (95.1 kg)   BP Readings from Last 3 Encounters:  06/14/19 111/77  02/02/19 (!) 142/71  01/27/19 124/75   Pulse Readings from Last 3 Encounters:  06/14/19 74  02/02/19 74  01/27/19 79   BMI Readings from Last 3 Encounters:  06/14/19 36.80 kg/m  02/02/19 36.22 kg/m  12/14/18 35.98 kg/m     Patient Care Team    Relationship Specialty Notifications Start End  Mellody Dance, DO PCP - General Family Medicine  01/16/17   Melrose Nakayama, MD  Cardiothoracic Surgery  12/31/10   Troy Sine, MD  Cardiology  12/31/10   Wonda Horner, MD Consulting Physician Gastroenterology  01/27/19      Patient Active Problem List   Diagnosis Date Noted  . Myocardial infarct ([place]) 01/16/2017  . HTN (hypertension)    . Dyslipidemia   . Lung cancer, upper lobe ([place]) 07/09/2011  . GERD (gastroesophageal reflux disease)   . Influenza vaccination declined 01/27/2019  . Counseled about COVID-19 virus infection 01/27/2019  . Morbidly obese ([place]) 05/15/2017  . Glucose intolerance (impaired glucose tolerance) 05/15/2017  . Tobacco abuse counseling 01/16/2017  . Acquired hypothyroidism 01/16/2017  . Family history of leukemia 01/16/2017  . Palpitations 07/21/2012  . CAD (coronary artery disease)   . Tobacco abuse   . Non-small cell lung cancer ([place]) 08/19/2010    Past Medical history, Surgical history, Family history, Social history, Allergies and Medications have been entered into the medical record, reviewed and changed as needed.    Current Meds  Medication Sig  . amLODipine (NORVASC) 5 MG tablet TAKE 1 TABLET BY MOUTH EVERY DAY  . aspirin 81 MG tablet Take 81 mg by mouth daily.  . Coenzyme Q10 (EQL COQ10)  300 MG CAPS Take by mouth.  . levothyroxine (SYNTHROID) 25 MCG tablet Take 0.5 tablets (12.5 mcg total) by mouth daily. In addition to the 50 mcg.  . levothyroxine (SYNTHROID) 50 MCG tablet TAKE 1 TABLET BY MOUTH DAILY BEFORE BREAKFAST  . metoprolol succinate (TOPROL-XL) 100 MG 24 hr tablet TAKE 1 TABLET BY MOUTH EVERY DAY  . Omega-3 Fatty Acids (FISH OIL) 1000 MG CAPS Take 2,000 mg by mouth daily.  . rosuvastatin (CRESTOR) 40 MG tablet TAKE 1 TABLET BY MOUTH EVERY DAY  . Vitamin D, Ergocalciferol, (DRISDOL) 1.25 MG (50000 UT) CAPS capsule Take one tablet wkly    Allergies:  No Known Allergies   Review of Systems:  A fourteen system review of systems was performed and found to be positive as per HPI.   Objective:   Blood pressure 111/77, pulse 74, temperature 97.8 F (36.6 C), temperature source Oral, height 5\' 4"  (1.626 m), weight 214 lb 6.4 oz (97.3 kg), SpO2 97 %. Body mass index is 36.8 kg/m. General:  Well Developed, well nourished, appropriate for stated age.  Neuro:  Alert and  oriented,  extra-ocular muscles intact  HEENT:  Normocephalic, atraumatic, neck supple, no carotid bruits appreciated  Skin:  no gross rash, warm, pink. Cardiac:  RRR, S1 S2 Respiratory:  ECTA B/L and A/P, Not using accessory muscles, speaking in full sentences- unlabored. Vascular:  Ext warm, no cyanosis apprec.; cap RF less 2 sec. Psych:  No HI/SI, judgement and insight good, Euthymic mood. Full Affect.

## 2019-06-14 NOTE — Patient Instructions (Signed)
Risk factors for prediabetes and type 2 diabetes  Researchers don't fully understand why some people develop prediabetes and type 2 diabetes and others don't.  It's clear that certain factors increase the risk, however, including:  Weight. The more fatty tissue you have, the more resistant your cells become to insulin.  Inactivity. The less active you are, the greater your risk. Physical activity helps you control your weight, uses up glucose as energy and makes your cells more sensitive to insulin.  Family history. Your risk increases if a parent or sibling has type 2 diabetes.  Race. Although it's unclear why, people of certain races -- including blacks, Hispanics, American Indians and Asian-Americans -- are at higher risk.  Age. Your risk increases as you get older. This may be because you tend to exercise less, lose muscle mass and gain weight as you age. But type 2 diabetes is also increasing dramatically among children, adolescents and younger adults.  Gestational diabetes. If you developed gestational diabetes when you were pregnant, your risk of developing prediabetes and type 2 diabetes later increases. If you gave birth to a baby weighing more than 9 pounds (4 kilograms), you're also at risk of type 2 diabetes.  Polycystic ovary syndrome. For women, having polycystic ovary syndrome -- a common condition characterized by irregular menstrual periods, excess hair growth and obesity -- increases the risk of diabetes.  High blood pressure. Having blood pressure over 140/90 millimeters of mercury (mm Hg) is linked to an increased risk of type 2 diabetes.  Abnormal cholesterol and triglyceride levels. If you have low levels of high-density lipoprotein (HDL), or "good," cholesterol, your risk of type 2 diabetes is higher. Triglycerides are another type of fat carried in the blood. People with high levels of triglycerides have an increased risk of type 2 diabetes. Your doctor can let you know what your  cholesterol and triglyceride levels are.  A good guide to good carbs: The glycemic index ---If you have diabetes, or at risk for diabetes, you know all too well that when you eat carbohydrates, your blood sugar goes up. The total amount of carbs you consume at a meal or in a snack mostly determines what your blood sugar will do. But the food itself also plays a role. A serving of white rice has almost the same effect as eating pure table sugar -- a quick, high spike in blood sugar. A serving of lentils has a slower, smaller effect.  ---Picking good sources of carbs can help you control your blood sugar and your weight. Even if you don't have diabetes, eating healthier carbohydrate-rich foods can help ward off a host of chronic conditions, from heart disease to various cancers to, well, diabetes.  ---One way to choose foods is with the glycemic index (GI). This tool measures how much a food boosts blood sugar.  The glycemic index rates the effect of a specific amount of a food on blood sugar compared with the same amount of pure glucose. A food with a glycemic index of 28 boosts blood sugar only 28% as much as pure glucose. One with a GI of 95 acts like pure glucose.    High glycemic foods result in a quick spike in insulin and blood sugar (also known as blood glucose).  Low glycemic foods have a slower, smaller effect- these are healthier for you.   Using the glycemic index Using the glycemic index is easy: choose foods in the low GI category instead of those in the high  GI category (see below), and go easy on those in between. Low glycemic index (GI of 55 or less): Most fruits and vegetables, beans, minimally processed grains, pasta, low-fat dairy foods, and nuts.  Moderate glycemic index (GI 56 to 69): White and sweet potatoes, corn, white rice, couscous, breakfast cereals such as Cream of Wheat and Mini Wheats.  High glycemic index (GI of 70 or higher): White bread, rice cakes, most crackers,  bagels, cakes, doughnuts, croissants, most packaged breakfast cereals. You can see the values for 100 commons foods and get links to more at www.health.CheapToothpicks.si.  Swaps for lowering glycemic index  Instead of this high-glycemic index food Eat this lower-glycemic index food  White rice Brown rice or converted rice  Instant oatmeal Steel-cut oats  Cornflakes Bran flakes  Baked potato Pasta, bulgur  White bread Whole-grain bread  Corn Peas or leafy greens       Prediabetes Eating Plan  Prediabetes--also called impaired glucose tolerance or impaired fasting glucose--is a condition that causes blood sugar (blood glucose) levels to be higher than normal. Following a healthy diet can help to keep prediabetes under control. It can also help to lower the risk of type 2 diabetes and heart disease, which are increased in people who have prediabetes. Along with regular exercise, a healthy diet:  Promotes weight loss.  Helps to control blood sugar levels.  Helps to improve the way that the body uses insulin.   WHAT DO I NEED TO KNOW ABOUT THIS EATING PLAN?   Use the glycemic index (GI) to plan your meals. The index tells you how quickly a food will raise your blood sugar. Choose low-GI foods. These foods take a longer time to raise blood sugar.  Pay close attention to the amount of carbohydrates in the food that you eat. Carbohydrates increase blood sugar levels.  Keep track of how many calories you take in. Eating the right amount of calories will help you to achieve a healthy weight. Losing about 7 percent of your starting weight can help to prevent type 2 diabetes.  You may want to follow a Mediterranean diet. This diet includes a lot of vegetables, lean meats or fish, whole grains, fruits, and healthy oils and fats.   WHAT FOODS CAN I EAT?  Grains Whole grains, such as whole-wheat or whole-grain breads, crackers, cereals, and pasta. Unsweetened oatmeal. Bulgur. Barley.  Quinoa. Brown rice. Corn or whole-wheat flour tortillas or taco shells. Vegetables Lettuce. Spinach. Peas. Beets. Cauliflower. Cabbage. Broccoli. Carrots. Tomatoes. Squash. Eggplant. Herbs. Peppers. Onions. Cucumbers. Brussels sprouts. Fruits Berries. Bananas. Apples. Oranges. Grapes. Papaya. Mango. Pomegranate. Kiwi. Grapefruit. Cherries. Meats and Other Protein Sources Seafood. Lean meats, such as chicken and [place] or lean cuts of pork and beef. Tofu. Eggs. Nuts. Beans. Dairy Low-fat or fat-free dairy products, such as yogurt, cottage cheese, and cheese. Beverages Water. Tea. Coffee. Sugar-free or diet soda. Seltzer water. Milk. Milk alternatives, such as soy or almond milk. Condiments Mustard. Relish. Low-fat, low-sugar ketchup. Low-fat, low-sugar barbecue sauce. Low-fat or fat-free mayonnaise. Sweets and Desserts Sugar-free or low-fat pudding. Sugar-free or low-fat ice cream and other frozen treats. Fats and Oils Avocado. Walnuts. Olive oil. The items listed above may not be a complete list of recommended foods or beverages. Contact your dietitian for more options.    WHAT FOODS ARE NOT RECOMMENDED?  Grains Refined white flour and flour products, such as bread, pasta, snack foods, and cereals. Beverages Sweetened drinks, such as sweet iced tea and soda. Sweets and Desserts  Baked goods, such as cake, cupcakes, pastries, cookies, and cheesecake. The items listed above may not be a complete list of foods and beverages to avoid. Contact your dietitian for more information.   This information is not intended to replace advice given to you by your health care provider. Make sure you discuss any questions you have with your health care provider.   Document Released: 06/21/2014 Document Reviewed: 06/21/2014 Elsevier Interactive Patient Education 2016 [employer].         Hypothyroidism  Hypothyroidism is when the thyroid gland does not make enough of certain hormones (it is  underactive). The thyroid gland is a small gland located in the lower front part of the neck, just in front of the windpipe (trachea). This gland makes hormones that help control how the body uses food for energy (metabolism) as well as how the heart and brain function. These hormones also play a role in keeping your bones strong. When the thyroid is underactive, it produces too little of the hormones thyroxine (T4) and triiodothyronine (T3). What are the causes? This condition may be caused by:  Hashimoto's disease. This is a disease in which the body's disease-fighting system (immune system) attacks the thyroid gland. This is the most common cause.  Viral infections.  Pregnancy.  Certain medicines.  Birth defects.  Past radiation treatments to the head or neck for cancer.  Past treatment with radioactive iodine.  Past exposure to radiation in the environment.  Past surgical removal of part or all of the thyroid.  Problems with a gland in the center of the brain (pituitary gland).  Lack of enough iodine in the diet. What increases the risk? You are more likely to develop this condition if:  You are female.  You have a family history of thyroid conditions.  You use a medicine called lithium.  You take medicines that affect the immune system (immunosuppressants). What are the signs or symptoms? Symptoms of this condition include:  Feeling as though you have no energy (lethargy).  Not being able to tolerate cold.  Weight gain that is not explained by a change in diet or exercise habits.  Lack of appetite.  Dry skin.  Coarse hair.  Menstrual irregularity.  Slowing of thought processes.  Constipation.  Sadness or depression. How is this diagnosed? This condition may be diagnosed based on:  Your symptoms, your medical history, and a physical exam.  Blood tests. You may also have imaging tests, such as an ultrasound or MRI. How is this treated? This  condition is treated with medicine that replaces the thyroid hormones that your body does not make. After you begin treatment, it may take several weeks for symptoms to go away. Follow these instructions at home:  Take over-the-counter and prescription medicines only as told by your health care provider.  If you start taking any new medicines, tell your health care provider.  Keep all follow-up visits as told by your health care provider. This is important. ? As your condition improves, your dosage of thyroid hormone medicine may change. ? You will need to have blood tests regularly so that your health care provider can monitor your condition. Contact a health care provider if:  Your symptoms do not get better with treatment.  You are taking thyroid replacement medicine and you: ? Sweat a lot. ? Have tremors. ? Feel anxious. ? Lose weight rapidly. ? Cannot tolerate heat. ? Have emotional swings. ? Have diarrhea. ? Feel weak. Get help right away if  you have:  Chest pain.  An irregular heartbeat.  A rapid heartbeat.  Difficulty breathing. Summary  Hypothyroidism is when the thyroid gland does not make enough of certain hormones (it is underactive).  When the thyroid is underactive, it produces too little of the hormones thyroxine (T4) and triiodothyronine (T3).  The most common cause is Hashimoto's disease, a disease in which the body's disease-fighting system (immune system) attacks the thyroid gland. The condition can also be caused by viral infections, medicine, pregnancy, or past radiation treatment to the head or neck.  Symptoms may include weight gain, dry skin, constipation, feeling as though you do not have energy, and not being able to tolerate cold.  This condition is treated with medicine to replace the thyroid hormones that your body does not make. This information is not intended to replace advice given to you by your health care provider. Make sure you discuss  any questions you have with your health care provider. Document Revised: 01/17/2017 Document Reviewed: 01/15/2017 Elsevier Patient Education  2020 [employer].

## 2019-06-15 LAB — TSH: TSH: 3.91 u[IU]/mL (ref 0.450–4.500)

## 2019-06-15 LAB — HEMOGLOBIN A1C
Est. average glucose Bld gHb Est-mCnc: 128 mg/dL
Hgb A1c MFr Bld: 6.1 % — ABNORMAL HIGH (ref 4.8–5.6)

## 2019-06-28 ENCOUNTER — Telehealth: Payer: Self-pay | Admitting: Cardiovascular Disease

## 2019-06-28 NOTE — Telephone Encounter (Signed)
-----   Message from Darlyn Chamber June, RN sent at 06/28/2019  8:50 AM EDT ----- Please schedule this pt for an OV with Dr.Kelly anytime after June 8th Thank you!

## 2019-06-28 NOTE — Telephone Encounter (Signed)
New Message  Called patient to schedule appt with Dr. Claiborne Billings

## 2019-07-27 ENCOUNTER — Other Ambulatory Visit: Payer: Self-pay

## 2019-07-27 ENCOUNTER — Ambulatory Visit (HOSPITAL_COMMUNITY): Payer: Medicare HMO | Attending: Cardiovascular Disease

## 2019-07-27 DIAGNOSIS — I2583 Coronary atherosclerosis due to lipid rich plaque: Secondary | ICD-10-CM | POA: Diagnosis not present

## 2019-07-27 DIAGNOSIS — Z951 Presence of aortocoronary bypass graft: Secondary | ICD-10-CM | POA: Diagnosis not present

## 2019-07-27 DIAGNOSIS — I2510 Atherosclerotic heart disease of native coronary artery without angina pectoris: Secondary | ICD-10-CM | POA: Insufficient documentation

## 2019-08-10 ENCOUNTER — Other Ambulatory Visit: Payer: Self-pay | Admitting: Cardiovascular Disease

## 2019-08-11 ENCOUNTER — Telehealth (INDEPENDENT_AMBULATORY_CARE_PROVIDER_SITE_OTHER): Payer: Medicare HMO | Admitting: Cardiovascular Disease

## 2019-08-11 ENCOUNTER — Other Ambulatory Visit: Payer: Self-pay | Admitting: Cardiovascular Disease

## 2019-08-11 ENCOUNTER — Encounter: Payer: Self-pay | Admitting: Cardiovascular Disease

## 2019-08-11 DIAGNOSIS — E039 Hypothyroidism, unspecified: Secondary | ICD-10-CM | POA: Diagnosis not present

## 2019-08-11 DIAGNOSIS — I252 Old myocardial infarction: Secondary | ICD-10-CM | POA: Diagnosis not present

## 2019-08-11 DIAGNOSIS — I10 Essential (primary) hypertension: Secondary | ICD-10-CM

## 2019-08-11 DIAGNOSIS — E785 Hyperlipidemia, unspecified: Secondary | ICD-10-CM

## 2019-08-11 DIAGNOSIS — Z720 Tobacco use: Secondary | ICD-10-CM | POA: Diagnosis not present

## 2019-08-11 DIAGNOSIS — Z951 Presence of aortocoronary bypass graft: Secondary | ICD-10-CM

## 2019-08-11 MED ORDER — LEVOTHYROXINE SODIUM 25 MCG PO TABS: 12.5000 ug | ORAL_TABLET | Freq: Every day | ORAL | 0 refills | Status: DC

## 2019-08-11 NOTE — Patient Instructions (Signed)

## 2019-08-11 NOTE — Progress Notes (Signed)
Virtual Visit via Telephone Note   This visit type was conducted due to national recommendations for restrictions regarding the COVID-19 Pandemic (e.g. social distancing) in an effort to limit this patient's exposure and mitigate transmission in our community.  Due to her co-morbid illnesses, this patient is at least at moderate risk for complications without adequate follow up.  This format is felt to be most appropriate for this patient at this time.  The patient did not have access to video technology/had technical difficulties with video requiring transitioning to audio format only (telephone).  All issues noted in this document were discussed and addressed.  No physical exam could be performed with this format.  Please refer to the patient's chart for her  consent to telehealth for [facility].   The patient was identified using 2 identifiers.  Date:  08/11/2019   ID:  [name], DOB 15-Dec-1945, MRN [id number]  Patient Location: Home Provider Location: Home  PCP:  Lorrene Reid, PA-C  Cardiologist:  Shelva Majestic, MD Electrophysiologist:  None   Evaluation Performed:  Follow-Up Visit  Chief Complaint: 39-month follow-up   History of Present Illness:    SAMYUKTA CURA is a 74 y.o. female who suffered an initial anterior wall myocardial infarction with ventricular fibrillation arrest requiring resuscitation and PTCA of her LAD in 1997. On 07/05/2010 she presented with increasing shortness of breath and was found to have critical life threatening anatomy with 99% left main equivalent disease.   Later that afternoon she underwent emergent CABG revascularization surgery by Dr. Roxan Hockey with a LIMA to the LAD, a vein graft to the second branch of the obtuse marginal vessel, and vein graft to the PDA.  Chest x-rays disclosed a left upper lung nodule and ultimately this proved to be adenocarcinoma for which she ultimately underwent upper lobe resection in July 2012. Additional  problems include hypertension, hyperlipidemia, obesity and probable metabolic syndrome. A nuclear perfusion study in July 2012 showed a post stress ejection fraction of 46% but otherwise fairly normal perfusion. EF at cath was 35% prior to surgery.   In December 2013 an NMR lipoprofile revealed that despite an LDL of 50 she had increased LDL particle #1312, and increased small LDL particle number at 761. Her insulin resistance was significantly elevated at 70. HDL particle number was low at 26.8 and a triglyceride of 152.evaluation.  An echo Doppler study on 08/04/2012 showed an ejection fraction at 50 - 55%. She had mild LVH. There was grade 1 diastolic dysfunction with distal anterior, anteroseptal and apical LAD scar. There is mild tricuspid regurgitation and mild mitral regurgitation. PA pressure estimated 32 mm.  A LexiScan study in 07/2012 was low risk. Ejection fraction was 56%. There is no evidence for ischemia. She had laboratory which showed an LDL particle number at 830 with a target LDL of 38 HDL 42 triglycerides 170. Her insulin resistance was elevated at 60. Hemoglobin A1c was 5.7.  When I last saw her in October 2017 she was still smoking one pack of cigarettes every 3 day.  She sees Dr. Roxan Hockey on a yearly basis and  saw him in August 2017..  A follow-up chest CT was reviewed and she was stable without evidence of local recurrence or metastatic disease status post left upper lobectomy for stage IA non-small cell carcinoma.  Over the past year, she denies any episodes of recurrent chest pain or awareness of palpitations.  She underwent a nuclear perfusion study on 11/05/2016, which revealed normal perfusion  and function without scar or ischemia. She saw Dr. Roxan Hockey in August 2018 and during that evaluation, she was significantly hypertensive with a blood pressure 170/87.  She was on Toprol-XL 100 mg, rosuvastatin 40 mg, and levothyroxine 50 g.  Unfortunately she still smokes and is  smoking anywhere from 1-3/4 of a pack daily.  She had laboratory in May and on one chemistry evaluation.  Her potassium was 5.5, a follow-up evaluation showed normal potassium.  Cholesterol was 123, HDL 39, and LDL 59.    When I saw her in October 2018, I initiated amlodipine 5 mg.  She was undergoing low dose CT imaging for CA.  We again discussed complete smoking cessation.    I saw her in January 2019. Over the prior year she hadshe has been going back and forth between [place] and [place].    She denies any significant shortness of breath.  She has not been very active.  Unfortunately she continues to smoke cigarettes and typically a pack of cigarettes may last 2 to 3 days. She has seen Dr. Roxan Hockey for cardiothoracic surgery follow-up in September 2019.  Laboratory in December 2019 showed an LDL cholesterol at 44, HDL 41, total cholesterol 114 and triglycerides 146.  She continued to be on amlodipine 5 mg and Toprol-XL 100 mg for hypertension and her CAD and  rosuvastatin 40 mg for hyperlipidemia in addition to omega-3 fatty acid.  She has hypothyroidism on levothyroxine.  She continues to be on daily aspirin.    She was evaluated in January 2020 at which time she remained stable from a cardiac standpoint. I last saw her on February 02, 2019.  She had seen Dr. Roxan Hockey in follow-up.  She sees Dr. Raliegh Scarlet for primary care.  She just completed lab work on January 22, 2019.  Lipid studies were excellent with total cholesterol 115, triglycerides 116, HDL 41, and LDL 53.  Renal function was stable with a BUN of 12 and creatinine 0.79.  TSH was borderline increased at 4.68.  CBC was stable.  She remains asymptomatic with reference to chest pain or shortness of breath.  She has not been successful with weight loss.    Unfortunately she still smokes cigarettes and a pack last 1-1/2 to 2 days.  She underwent a follow-up echo Doppler study on July 27, 2019 which showed an EF of 50 to 55% with  septal hypokinesis consistent with her prior CABG revascularization.  There was mild asymmetric LVH and grade 2 diastolic dysfunction.  Tissue Doppler was abnormal with elevated left ventricular end-diastolic pressure.  She presents for a follow-up telemedicine evaluation today.   The patient does not have symptoms concerning for COVID-19 infection (fever, chills, cough, or new shortness of breath).    Past Medical History:  Diagnosis Date  . CAD (coronary artery disease)   . Cancer ([place])   . Dyslipidemia   . GERD (gastroesophageal reflux disease)   . HTN (hypertension)   . Myocardial infarct ([place])   . Non-small cell lung cancer ([place]) 08/2010   Stage IA, status post left upper lobectomy July 2012  . Tobacco abuse    Past Surgical History:  Procedure Laterality Date  . CARDIAC CATHETERIZATION  07/06/2010   see CABG report - pt sent to OR  . CARDIOVASCULAR STRESS TEST  09/11/2010   R/S MV - normal perfusion in all regions, EF 46%, no scintigraphic evidence of inducible myocardial ischemia; global LV systolic function mildly reduced; no significant wall motion abnormalities noted; Exercise  capacity 7 METS; EKG negative for ischemia; low risk study, no signifcant change from previous study 08/2003  . CORONARY ARTERY BYPASS GRAFT  07/11/2010   LIMA to LAD; SVG to 2nd branch OM; SVG to posterior descending artery  . LEFT VATS  09/17/2010  . TEE WITHOUT CARDIOVERSION  07/06/2010   during emergent CABG surgery; 2-3+ mitral regurgitation     Current Meds  Medication Sig  . amLODipine (NORVASC) 5 MG tablet TAKE 1 TABLET BY MOUTH EVERY DAY  . aspirin 81 MG tablet Take 81 mg by mouth daily.  . Coenzyme Q10 (EQL COQ10) 300 MG CAPS Take by mouth.  . levothyroxine (SYNTHROID) 25 MCG tablet Take 0.5 tablets (12.5 mcg total) by mouth daily. In addition to the 50 mcg.  . levothyroxine (SYNTHROID) 50 MCG tablet TAKE 1 TABLET BY MOUTH DAILY BEFORE BREAKFAST  . metoprolol succinate (TOPROL-XL) 100 MG 24  hr tablet TAKE 1 TABLET BY MOUTH EVERY DAY  . Omega-3 Fatty Acids (FISH OIL) 1000 MG CAPS Take 2,000 mg by mouth daily.  . rosuvastatin (CRESTOR) 40 MG tablet TAKE 1 TABLET BY MOUTH EVERY DAY  . Vitamin D, Ergocalciferol, (DRISDOL) 1.25 MG (50000 UT) CAPS capsule Take one tablet wkly     Allergies:   Patient has no known allergies.   Social History   Tobacco Use  . Smoking status: Current Every Day Smoker    Packs/day: 0.50    Years: 45.00    Pack years: 22.50    Types: Cigarettes  . Smokeless tobacco: Never Used  . Tobacco comment: on and off since lobectomy  Vaping Use  . Vaping Use: Never used  Substance Use Topics  . Alcohol use: No    Alcohol/week: 0.0 standard drinks  . Drug use: No    She is married has 3 children 5 grandchildren. She is an ex-smoker. She does not routinely exercise. There is no alcohol use.  Family Hx: The patient's family history includes Aplastic anemia in her maternal grandfather; Heart attack in her father; Hypothyroidism in her father; Stroke in her paternal grandfather.  ROS:   Please see the history of present illness.    No fevers chills night sweats No cough No wheezing No recurrent anginal symptoms Mild shortness of breath with activity No swelling Positive for hypothyroidism No neurologic symptoms Sleeping adequately All other systems reviewed and are negative.   Prior CV studies:   The following studies were reviewed today:  ECHO 07/27/2019 IMPRESSIONS  1. Septal hypokinesis consistent with prior CABG. Left ventricular  ejection fraction, by estimation, is 50 to 55%. The left ventricle has low  normal function. The left ventricle has no regional wall motion  abnormalities. There is mild asymmetric left  ventricular hypertrophy of the basal-septal segment. Left ventricular  diastolic parameters are consistent with Grade II diastolic dysfunction  (pseudonormalization). Elevated left ventricular end-diastolic pressure.  The  average left ventricular global  longitudinal strain is -20.8 %. The global longitudinal strain is normal.  2. Right ventricular systolic function is normal. The right ventricular  size is normal. There is normal pulmonary artery systolic pressure.  3. The mitral valve is normal in structure. Trivial mitral valve  regurgitation. No evidence of mitral stenosis.  4. The aortic valve is tricuspid. Aortic valve regurgitation is not  visualized. No aortic stenosis is present.  5. The inferior vena cava is normal in size with greater than 50%  respiratory variability, suggesting right atrial pressure of 3 mmHg.   Labs/Other Tests and  Data Reviewed:    EKG:  An ECG dated 02/02/2019 was personally reviewed today and demonstrated:  Normal sinus rhythm at 74 bpm.  Poor R wave progression V1 through V4 with T wave inversion.  Recent Labs: 01/22/2019: ALT 31; BUN 12; Creatinine, Ser 0.79; Hemoglobin 15.4; Platelets 203; Potassium 5.4; Sodium 144 06/14/2019: TSH 3.910   Recent Lipid Panel Lab Results  Component Value Date/Time   CHOL 115 01/22/2019 12:08 PM   CHOL 114 07/22/2012 08:40 AM   TRIG 116 01/22/2019 12:08 PM   TRIG 170 (H) 07/22/2012 08:40 AM   HDL 41 01/22/2019 12:08 PM   HDL 42 07/22/2012 08:40 AM   CHOLHDL 2.8 01/22/2019 12:08 PM   CHOLHDL 3.2 06/26/2016 08:59 AM   LDLCALC 53 01/22/2019 12:08 PM   LDLCALC 38 07/22/2012 08:40 AM    Wt Readings from Last 3 Encounters:  08/11/19 214 lb (97.1 kg)  06/14/19 214 lb 6.4 oz (97.3 kg)  02/02/19 211 lb (95.7 kg)     Objective:    Vital Signs:  Ht 5\' 4"  (1.626 m)   Wt 214 lb (97.1 kg)   BMI 36.73 kg/m    Since this was a virtual visit I could not physically examine the patient. Vital signs were reviewed but she did not have the opportunity to take a blood pressure. Breathing was normal and not labored There was no audible wheezing She denied any chest wall pain Her heart rhythm was regular with no awareness of irregular  rhythm No abdominal pain No swelling Neurologic symptoms Normal affect and mood  ASSESSMENT & PLAN:    1. CAD: She suffered an acute anterior wall myocardial infarction with VF cardiac arrest in 1997 at which time she was successfully resuscitated and underwent successful emergent intervention to her LAD.  In 2012 she was found to have high-grade left main equivalent coronary disease and underwent emergent CABG revascularization surgery with Dr. Roxan Hockey.  Presently she is without recurrent anginal symptomatology. 2. History of lung cancer: Fortuitously found at the time of her bypass surgery, stage Ia non-small cell CA followed by Dr. Roxan Hockey. 3. Tobacco abuse: I again discussed the necessity of complete smoking cessation.  She realizes this is not doing her any benefit and hopefully she will quit. 4. Hyperlipidemia: Target LDL less than 70.  Currently on rosuvastatin 40 mg and omega-3 fatty acid 5. Hypothyroidism: Currently on levothyroxine replacement. 6. Diastolic dysfunction: Grade 2 on most recent echo Doppler study with mild asymmetric hypertrophy and septal wall motion abnormality contributed by her prior CABG revascularization and prior MI.  COVID-19 Education: The signs and symptoms of COVID-19 were discussed with the patient and how to seek care for testing (follow up with PCP or arrange E-visit).  The importance of social distancing was discussed today.  Time:   Today, I have spent 20 minutes with the patient with telehealth technology discussing the above problems.     Medication Adjustments/Labs and Tests Ordered: Current medicines are reviewed at length with the patient today.  Concerns regarding medicines are outlined above.   Tests Ordered: No orders of the defined types were placed in this encounter.   Medication Changes: No orders of the defined types were placed in this encounter.   Follow Up:  In Person in 6 months  Signed, Shelva Majestic, MD  08/11/2019  9:09 AM    [place]

## 2019-08-13 ENCOUNTER — Encounter: Payer: Self-pay | Admitting: Cardiovascular Disease

## 2019-09-04 DIAGNOSIS — E785 Hyperlipidemia, unspecified: Secondary | ICD-10-CM

## 2019-09-04 DIAGNOSIS — I2510 Atherosclerotic heart disease of native coronary artery without angina pectoris: Secondary | ICD-10-CM

## 2019-09-04 DIAGNOSIS — E039 Hypothyroidism, unspecified: Secondary | ICD-10-CM

## 2019-09-04 DIAGNOSIS — I2583 Coronary atherosclerosis due to lipid rich plaque: Secondary | ICD-10-CM

## 2019-09-13 DIAGNOSIS — I2510 Atherosclerotic heart disease of native coronary artery without angina pectoris: Secondary | ICD-10-CM | POA: Diagnosis not present

## 2019-09-13 DIAGNOSIS — E039 Hypothyroidism, unspecified: Secondary | ICD-10-CM | POA: Diagnosis not present

## 2019-09-13 DIAGNOSIS — E785 Hyperlipidemia, unspecified: Secondary | ICD-10-CM | POA: Diagnosis not present

## 2019-09-13 DIAGNOSIS — I2583 Coronary atherosclerosis due to lipid rich plaque: Secondary | ICD-10-CM | POA: Diagnosis not present

## 2019-09-13 LAB — COMPREHENSIVE METABOLIC PANEL
ALT: 26 IU/L (ref 0–32)
AST: 21 IU/L (ref 0–40)
Albumin/Globulin Ratio: 1.6 (ref 1.2–2.2)
Albumin: 4.2 g/dL (ref 3.7–4.7)
Alkaline Phosphatase: 71 IU/L (ref 48–121)
BUN/Creatinine Ratio: 11 — ABNORMAL LOW (ref 12–28)
BUN: 9 mg/dL (ref 8–27)
Bilirubin Total: 0.3 mg/dL (ref 0.0–1.2)
CO2: 26 mmol/L (ref 20–29)
Calcium: 9.5 mg/dL (ref 8.7–10.3)
Chloride: 104 mmol/L (ref 96–106)
Creatinine, Ser: 0.79 mg/dL (ref 0.57–1.00)
GFR calc Af Amer: 86 mL/min/{1.73_m2} (ref >59)
GFR calc non Af Amer: 75 mL/min/{1.73_m2} (ref >59)
Globulin, Total: 2.7 g/dL (ref 1.5–4.5)
Glucose: 94 mg/dL (ref 65–99)
Potassium: 5.6 mmol/L — ABNORMAL HIGH (ref 3.5–5.2)
Sodium: 143 mmol/L (ref 134–144)
Total Protein: 6.9 g/dL (ref 6.0–8.5)

## 2019-09-13 LAB — CBC
Hematocrit: 47.2 % — ABNORMAL HIGH (ref 34.0–46.6)
Hemoglobin: 15.2 g/dL (ref 11.1–15.9)
MCH: 28.1 pg (ref 26.6–33.0)
MCHC: 32.2 g/dL (ref 31.5–35.7)
MCV: 87 fL (ref 79–97)
Platelets: 217 10*3/uL (ref 150–450)
RBC: 5.4 x10E6/uL — ABNORMAL HIGH (ref 3.77–5.28)
RDW: 13 % (ref 11.7–15.4)
WBC: 9.7 10*3/uL (ref 3.4–10.8)

## 2019-09-13 LAB — LIPID PANEL
Chol/HDL Ratio: 2.9 ratio (ref 0.0–4.4)
Cholesterol, Total: 114 mg/dL (ref 100–199)
HDL: 40 mg/dL (ref >39)
LDL Chol Calc (NIH): 52 mg/dL (ref 0–99)
Triglycerides: 121 mg/dL (ref 0–149)
VLDL Cholesterol Cal: 22 mg/dL (ref 5–40)

## 2019-09-13 LAB — TSH: TSH: 4.76 u[IU]/mL — ABNORMAL HIGH (ref 0.450–4.500)

## 2019-10-04 ENCOUNTER — Other Ambulatory Visit: Payer: Self-pay

## 2019-10-04 DIAGNOSIS — Z79899 Other long term (current) drug therapy: Secondary | ICD-10-CM

## 2019-10-05 ENCOUNTER — Other Ambulatory Visit: Payer: Self-pay

## 2019-10-05 DIAGNOSIS — Z79899 Other long term (current) drug therapy: Secondary | ICD-10-CM

## 2019-10-08 ENCOUNTER — Other Ambulatory Visit: Payer: Self-pay | Admitting: Thoracic Surgery (Cardiothoracic Vascular Surgery)

## 2019-10-08 DIAGNOSIS — C3410 Malignant neoplasm of upper lobe, unspecified bronchus or lung: Secondary | ICD-10-CM

## 2019-10-12 DIAGNOSIS — Z79899 Other long term (current) drug therapy: Secondary | ICD-10-CM | POA: Diagnosis not present

## 2019-10-13 LAB — BASIC METABOLIC PANEL
BUN/Creatinine Ratio: 11 — ABNORMAL LOW (ref 12–28)
BUN: 9 mg/dL (ref 8–27)
CO2: 27 mmol/L (ref 20–29)
Calcium: 9.8 mg/dL (ref 8.7–10.3)
Chloride: 101 mmol/L (ref 96–106)
Creatinine, Ser: 0.79 mg/dL (ref 0.57–1.00)
GFR calc Af Amer: 86 mL/min/{1.73_m2} (ref >59)
GFR calc non Af Amer: 75 mL/min/{1.73_m2} (ref >59)
Glucose: 102 mg/dL — ABNORMAL HIGH (ref 65–99)
Potassium: 5.4 mmol/L — ABNORMAL HIGH (ref 3.5–5.2)
Sodium: 142 mmol/L (ref 134–144)

## 2019-10-15 ENCOUNTER — Encounter: Payer: Self-pay | Admitting: Physician Assistant

## 2019-10-15 ENCOUNTER — Other Ambulatory Visit: Payer: Self-pay | Admitting: Thoracic Surgery (Cardiothoracic Vascular Surgery)

## 2019-10-26 ENCOUNTER — Ambulatory Visit
Admission: RE | Admit: 2019-10-26 | Discharge: 2019-10-26 | Disposition: A | Discharge status: Home / Self Care | Payer: Medicare HMO | Source: Ambulatory Visit | Attending: Thoracic Surgery (Cardiothoracic Vascular Surgery) | Admitting: Thoracic Surgery (Cardiothoracic Vascular Surgery)

## 2019-10-26 DIAGNOSIS — I700 Atherosclerosis of aorta: Secondary | ICD-10-CM | POA: Diagnosis not present

## 2019-10-26 DIAGNOSIS — J439 Emphysema, unspecified: Secondary | ICD-10-CM | POA: Diagnosis not present

## 2019-10-26 DIAGNOSIS — C3492 Malignant neoplasm of unspecified part of left bronchus or lung: Secondary | ICD-10-CM | POA: Diagnosis not present

## 2019-10-26 DIAGNOSIS — C3410 Malignant neoplasm of upper lobe, unspecified bronchus or lung: Secondary | ICD-10-CM

## 2019-10-26 DIAGNOSIS — I2510 Atherosclerotic heart disease of native coronary artery without angina pectoris: Secondary | ICD-10-CM | POA: Diagnosis not present

## 2019-10-28 DIAGNOSIS — R69 Illness, unspecified: Secondary | ICD-10-CM | POA: Diagnosis not present

## 2019-11-02 ENCOUNTER — Other Ambulatory Visit: Payer: Medicare HMO

## 2019-11-02 ENCOUNTER — Ambulatory Visit: Payer: Medicare HMO | Admitting: Thoracic Surgery (Cardiothoracic Vascular Surgery)

## 2019-11-02 ENCOUNTER — Encounter: Payer: Self-pay | Admitting: Thoracic Surgery (Cardiothoracic Vascular Surgery)

## 2019-11-02 ENCOUNTER — Other Ambulatory Visit: Payer: Self-pay

## 2019-11-02 VITALS — BP 150/79 | HR 76 | Resp 20 | Ht 64.0 in | Wt 210.0 lb

## 2019-11-02 DIAGNOSIS — Z902 Acquired absence of lung [part of]: Secondary | ICD-10-CM

## 2019-11-02 DIAGNOSIS — Z85118 Personal history of other malignant neoplasm of bronchus and lung: Secondary | ICD-10-CM | POA: Diagnosis not present

## 2019-11-02 NOTE — Progress Notes (Signed)
[address]       [place] [address]             [phone]     HPI: [name] returns for a scheduled follow-up visit  [name] is a 74 year old woman with a history of coronary disease, MI, CABG, stage Ia non-small cell carcinoma of the left upper lobe, status post left upper lobectomy, hypertension, hyperlipidemia, reflux, and ongoing tobacco abuse.  She presented with an MI in 2012 and underwent emergent CABG.  She was noted to have a left upper lobe lung mass.  She had a left upper lobectomy a couple of months after her CABG.  The lung lesion turned out to be a stage Ia non-small cell carcinoma.  She did not require any adjuvant therapy.  She has been followed since that time.  She recently saw Dr. Claiborne Billings.  She was doing well from a cardiac standpoint.  She continues to smoke.  She currently smokes less than half a pack a day.  She actually expressed some interest in quitting, mainly due to the cost, but says she just has to "make up her mind."  She is not having any chest pain, pressure, or tightness.  No change in appetite or weight loss.  Past Medical History:  Diagnosis Date   CAD (coronary artery disease)    Cancer ([address])    Dyslipidemia    GERD (gastroesophageal reflux disease)    HTN (hypertension)    Myocardial infarct ([facility])    Non-small cell lung cancer ([place]) 08/2010   Stage IA, status post left upper lobectomy July 2012   Tobacco abuse     Current Outpatient Medications  Medication Sig Dispense Refill   amLODipine (NORVASC) 5 MG tablet TAKE 1 TABLET BY MOUTH EVERY DAY 90 tablet 2   aspirin 81 MG tablet Take 81 mg by mouth daily.     Coenzyme Q10 (EQL COQ10) 300 MG CAPS Take by mouth.     levothyroxine (SYNTHROID) 25 MCG tablet Take 0.5 tablets (12.5 mcg total) by mouth daily. In addition to the 50 mcg. 60 tablet 0   levothyroxine (SYNTHROID) 50 MCG tablet TAKE 1 TABLET BY MOUTH DAILY BEFORE BREAKFAST 90 tablet 0    metoprolol succinate (TOPROL-XL) 100 MG 24 hr tablet TAKE 1 TABLET BY MOUTH EVERY DAY 90 tablet 2   Omega-3 Fatty Acids (FISH OIL) 1000 MG CAPS Take 2,000 mg by mouth daily.     rosuvastatin (CRESTOR) 40 MG tablet TAKE 1 TABLET BY MOUTH EVERY DAY 90 tablet 1   Vitamin D, Ergocalciferol, (DRISDOL) 1.25 MG (50000 UT) CAPS capsule Take one tablet wkly 12 capsule 3   No current facility-administered medications for this visit.    Physical Exam BP (!) 150/79    Pulse 76    Resp 20    Ht 5\' 4"  (1.626 m)    Wt 210 lb (95.3 kg)    SpO2 93% Comment: RA   BMI 36.05 kg/m  Obese 74 year old woman in no acute distress Alert and oriented x3 with no focal deficits No cervical or supraclavicular adenopathy, no carotid bruits Cardiac regular rate and rhythm Lungs diminished at left base but otherwise clear Trace edema right leg no edema left leg  Diagnostic Tests: CT CHEST WITHOUT CONTRAST  TECHNIQUE: Multidetector CT imaging of the chest was performed following the standard protocol without IV contrast.  COMPARISON:  10/23/2018  FINDINGS: Cardiovascular: Aortic atherosclerosis. Normal heart size. Extensive 3 vessel coronary  artery calcifications. No pericardial effusion.  Mediastinum/Nodes: No enlarged mediastinal, hilar, or axillary lymph nodes. Thyroid gland, trachea, and esophagus demonstrate no significant findings.  Lungs/Pleura: Mild, predominantly paraseptal emphysema. Unchanged postoperative appearance of the chest status post left upper lobectomy. Stable, definitively benign 5 mm fissural nodule of the right middle lobe abutting the minor fissure (series 8, image 7). No pleural effusion or pneumothorax.  Upper Abdomen: No acute abnormality.  Musculoskeletal: No chest wall mass or suspicious bone lesions identified.  IMPRESSION: 1. Unchanged postoperative appearance of the chest status post left upper lobectomy. No evidence of recurrent or metastatic disease in the  chest. 2. Emphysema (ICD10-J43.9). 3. Coronary artery disease. Aortic Atherosclerosis (ICD10-I70.0).   Electronically Signed   By: Eddie Candle M.D.   On: 10/26/2019 11:38 I personally reviewed the CT images and concur with the findings noted above  Impression: [name] is a 74 year old woman with a history of coronary disease, MI, CABG, stage Ia non-small cell carcinoma of the left upper lobe, status post left upper lobectomy, hypertension, hyperlipidemia, reflux, and ongoing tobacco abuse.  Tobacco abuse-continues to smoke.  Actually is now considering quitting for the first time.  Has not yet made that decision.  CAD-no anginal symptoms at present.  Followed by Dr. Claiborne Billings.  Aortic atherosclerosis-noted on CT.  She is on a beta-blocker and a statin.  Stage Ia lung cancer-now 9 years out from surgery.  No evidence of recurrent disease.  Still at risk for a new primary due to ongoing tobacco abuse.  Plan: Quit smoking Return in 1 year with CT chest  Melrose Nakayama, MD [facility] [phone]

## 2019-11-03 DIAGNOSIS — R69 Illness, unspecified: Secondary | ICD-10-CM | POA: Diagnosis not present

## 2019-11-15 ENCOUNTER — Other Ambulatory Visit: Payer: Self-pay | Admitting: Cardiovascular Disease

## 2019-11-16 ENCOUNTER — Other Ambulatory Visit: Payer: Self-pay

## 2019-11-16 DIAGNOSIS — E039 Hypothyroidism, unspecified: Secondary | ICD-10-CM

## 2019-11-16 MED ORDER — LEVOTHYROXINE SODIUM 25 MCG PO TABS: 12.5000 ug | ORAL_TABLET | Freq: Every day | ORAL | 9 refills | Status: DC

## 2019-11-17 ENCOUNTER — Other Ambulatory Visit: Payer: Self-pay | Admitting: Cardiovascular Disease

## 2019-11-17 DIAGNOSIS — E039 Hypothyroidism, unspecified: Secondary | ICD-10-CM

## 2019-12-15 ENCOUNTER — Other Ambulatory Visit: Payer: Self-pay | Admitting: Cardiovascular Disease

## 2019-12-21 DIAGNOSIS — H18593 Other hereditary corneal dystrophies, bilateral: Secondary | ICD-10-CM | POA: Diagnosis not present

## 2019-12-21 DIAGNOSIS — H52201 Unspecified astigmatism, right eye: Secondary | ICD-10-CM | POA: Diagnosis not present

## 2019-12-21 DIAGNOSIS — Z961 Presence of intraocular lens: Secondary | ICD-10-CM | POA: Diagnosis not present

## 2019-12-21 DIAGNOSIS — H524 Presbyopia: Secondary | ICD-10-CM | POA: Diagnosis not present

## 2020-02-07 ENCOUNTER — Other Ambulatory Visit: Payer: Self-pay | Admitting: Cardiovascular Disease

## 2020-02-14 ENCOUNTER — Telehealth: Payer: Self-pay | Admitting: Cardiovascular Disease

## 2020-02-14 NOTE — Telephone Encounter (Signed)
*  STAT* If patient is at the pharmacy, call can be transferred to refill team.   1. Which medications need to be refilled? (please list name of each medication and dose if known) metoprolol succinate (TOPROL-XL) 100 MG 24 hr tablet  2. Which pharmacy/location (including street and city if local pharmacy) is medication to be sent to? [id number] - , [place] [address]  3. Do they need a 30 day or 90 day supply? [place]

## 2020-02-15 MED ORDER — METOPROLOL SUCCINATE ER 100 MG PO TB24
100.0000 mg | ORAL_TABLET | Freq: Every day | ORAL | 3 refills | Status: DC
Start: 2020-02-15 — End: 2020-06-20

## 2020-02-24 ENCOUNTER — Encounter: Payer: Self-pay | Admitting: Physician Assistant

## 2020-03-16 DIAGNOSIS — Z79899 Other long term (current) drug therapy: Secondary | ICD-10-CM

## 2020-03-21 DIAGNOSIS — Z79899 Other long term (current) drug therapy: Secondary | ICD-10-CM | POA: Diagnosis not present

## 2020-03-21 LAB — LIPID PANEL
Chol/HDL Ratio: 2.7 ratio (ref 0.0–4.4)
Cholesterol, Total: 123 mg/dL (ref 100–199)
HDL: 46 mg/dL (ref >39)
LDL Chol Calc (NIH): 56 mg/dL (ref 0–99)
Triglycerides: 119 mg/dL (ref 0–149)
VLDL Cholesterol Cal: 21 mg/dL (ref 5–40)

## 2020-03-21 LAB — COMPREHENSIVE METABOLIC PANEL
ALT: 30 IU/L (ref 0–32)
AST: 21 IU/L (ref 0–40)
Albumin/Globulin Ratio: 1.4 (ref 1.2–2.2)
Albumin: 4.2 g/dL (ref 3.7–4.7)
Alkaline Phosphatase: 72 IU/L (ref 44–121)
BUN/Creatinine Ratio: 13 (ref 12–28)
BUN: 10 mg/dL (ref 8–27)
Bilirubin Total: 0.3 mg/dL (ref 0.0–1.2)
CO2: 29 mmol/L (ref 20–29)
Calcium: 9.6 mg/dL (ref 8.7–10.3)
Chloride: 102 mmol/L (ref 96–106)
Creatinine, Ser: 0.79 mg/dL (ref 0.57–1.00)
GFR calc Af Amer: 86 mL/min/{1.73_m2} (ref >59)
GFR calc non Af Amer: 74 mL/min/{1.73_m2} (ref >59)
Globulin, Total: 2.9 g/dL (ref 1.5–4.5)
Glucose: 99 mg/dL (ref 65–99)
Potassium: 5.6 mmol/L — ABNORMAL HIGH (ref 3.5–5.2)
Sodium: 142 mmol/L (ref 134–144)
Total Protein: 7.1 g/dL (ref 6.0–8.5)

## 2020-03-21 LAB — CBC
Hematocrit: 47.9 % — ABNORMAL HIGH (ref 34.0–46.6)
Hemoglobin: 15.6 g/dL (ref 11.1–15.9)
MCH: 28.8 pg (ref 26.6–33.0)
MCHC: 32.6 g/dL (ref 31.5–35.7)
MCV: 89 fL (ref 79–97)
Platelets: 205 10*3/uL (ref 150–450)
RBC: 5.41 x10E6/uL — ABNORMAL HIGH (ref 3.77–5.28)
RDW: 13.7 % (ref 11.7–15.4)
WBC: 9.3 10*3/uL (ref 3.4–10.8)

## 2020-03-21 LAB — TSH: TSH: 4.4 u[IU]/mL (ref 0.450–4.500)

## 2020-03-27 ENCOUNTER — Other Ambulatory Visit: Payer: Self-pay

## 2020-03-27 ENCOUNTER — Encounter: Payer: Self-pay | Admitting: Cardiovascular Disease

## 2020-03-27 ENCOUNTER — Ambulatory Visit: Payer: Medicare HMO | Admitting: Cardiovascular Disease

## 2020-03-27 VITALS — BP 131/65 | HR 74 | Ht 64.0 in | Wt 217.0 lb

## 2020-03-27 DIAGNOSIS — E039 Hypothyroidism, unspecified: Secondary | ICD-10-CM

## 2020-03-27 DIAGNOSIS — Z79899 Other long term (current) drug therapy: Secondary | ICD-10-CM | POA: Diagnosis not present

## 2020-03-27 DIAGNOSIS — Z85118 Personal history of other malignant neoplasm of bronchus and lung: Secondary | ICD-10-CM | POA: Diagnosis not present

## 2020-03-27 DIAGNOSIS — Z951 Presence of aortocoronary bypass graft: Secondary | ICD-10-CM

## 2020-03-27 DIAGNOSIS — Z720 Tobacco use: Secondary | ICD-10-CM | POA: Diagnosis not present

## 2020-03-27 DIAGNOSIS — E875 Hyperkalemia: Secondary | ICD-10-CM | POA: Diagnosis not present

## 2020-03-27 DIAGNOSIS — I252 Old myocardial infarction: Secondary | ICD-10-CM

## 2020-03-27 DIAGNOSIS — E668 Other obesity: Secondary | ICD-10-CM | POA: Diagnosis not present

## 2020-03-27 DIAGNOSIS — E785 Hyperlipidemia, unspecified: Secondary | ICD-10-CM | POA: Diagnosis not present

## 2020-03-27 MED ORDER — HYDROCHLOROTHIAZIDE 12.5 MG PO CAPS: 12.5000 mg | ORAL_CAPSULE | ORAL | 3 refills | Status: DC | PRN

## 2020-03-27 NOTE — Progress Notes (Signed)
Patient ID: [name], female   DOB: 08-30-1945, 75 y.o.   MRN: [id number] n       HPI: [name] is a 75 y.o. female who presents to the office today for an 8 month follow up cardiology evaluation.  [name] suffered initial anterior wall myocardial infarction with ventricular fibrillation arrest requiring resuscitation and PTCA of her LAD in 1997. On 07/05/2010 she presented with increasing shortness of breath and was found to have critical life threatening anatomy with 99% left main equivalent disease.   Later that afternoon she underwent emergent CABG revascularization surgery by Dr. Roxan Hockey with a LIMA to the LAD, a vein graft to the second branch of the obtuse marginal vessel, and vein graft to the PDA.  Chest x-rays disclosed a left upper lung nodule and ultimately this proved to be adenocarcinoma for which she ultimately underwent upper lobe resection in July 2012. Additional problems include hypertension, hyperlipidemia, obesity and probable metabolic syndrome. A nuclear perfusion study in July 2012 showed a post stress ejection fraction of 46% but otherwise fairly normal perfusion. EF at cath was 35% prior to surgery.   In December 2013 an NMR lipoprofile revealed that despite an LDL of 50 she had increased LDL particle #1312, and increased small LDL particle number at 761. Her insulin resistance was significantly elevated at 70. HDL particle number was low at 26.8 and a triglyceride of 152.evaluation.  An echo Doppler study on 08/04/2012 showed an ejection fraction at 50 - 55%. She had mild LVH. There was grade 1 diastolic dysfunction with distal anterior, anteroseptal and apical LAD scar. There is mild tricuspid regurgitation and mild mitral regurgitation. PA pressure estimated 32 mm.  A LexiScan study in 07/2012 was low risk. Ejection fraction was 56%. There is no evidence for ischemia. She had laboratory which showed an LDL particle number at 830 with a target LDL of 38  HDL 42 triglycerides 170. Her insulin resistance was elevated at 60. Hemoglobin A1c was 5.7.  When I last saw her in October 2017 she was still smoking one pack of cigarettes every 3 day.  She sees Dr. Roxan Hockey on a yearly basis and  saw him in August 2017..  A follow-up chest CT was reviewed and she was stable without evidence of local recurrence or metastatic disease status post left upper lobectomy for stage IA non-small cell carcinoma.  Over the past year, she denies any episodes of recurrent chest pain or awareness of palpitations.  She underwent a nuclear perfusion study on 11/05/2016, which revealed normal perfusion and function without scar or ischemia. She saw Dr. Roxan Hockey in August 2018 and during that evaluation, she was significantly hypertensive with a blood pressure 170/87.  She was on Toprol-XL 100 mg, rosuvastatin 40 mg, and levothyroxine 50 g.  Unfortunately she still smokes and is smoking anywhere from 1-3/4 of a pack daily.  She had laboratory in May and on one chemistry evaluation.  Her potassium was 5.5, a follow-up evaluation showed normal potassium.  Cholesterol was 123, HDL 39, and LDL 59.    When I saw her in October 2018, I initiated amlodipine 5 mg.  She was undergoing low dose CT imaging for CA.  We again discussed complete smoking cessation.    I saw her in January 2019. Over the prior year she hadshe has been going back and forth between [place] and [place].    She denies any significant shortness of breath.  She has not been very active.  Unfortunately she continues to smoke cigarettes and typically a pack of cigarettes may last 2 to 3 days. She has seen Dr. Roxan Hockey for cardiothoracic surgery follow-up in September 2019.  Laboratory in December 2019 showed an LDL cholesterol at 44, HDL 41, total cholesterol 114 and triglycerides 146.  She continued to be on amlodipine 5 mg and Toprol-XL 100 mg for hypertension and her CAD and  rosuvastatin 40 mg for  hyperlipidemia in addition to omega-3 fatty acid.  She has hypothyroidism on levothyroxine.  She continues to be on daily aspirin.    She was evaluated in January 2020 at which time she remained stable from a cardiac standpoint.  She had seen Dr. Roxan Hockey in follow-up.  She sees Dr. Raliegh Scarlet for primary care.  She just completed lab work on January 22, 2019.  Lipid studies were excellent with total cholesterol 115, triglycerides 116, HDL 41, and LDL 53.  Renal function was stable with a BUN of 12 and creatinine 0.79.  TSH was borderline increased at 4.68.  CBC was stable.  She remains asymptomatic with reference to chest pain or shortness of breath.  She has not been successful with weight loss.    I last saw her in a telemedicine evaluation in June 2021.  Unfortunately she was still smoking cigarettes.  She underwent a follow-up echo Doppler study on July 27, 2019 which showed an EF of 50 to 55% with septal hypokinesis consistent with her prior CABG revascularization.  There was mild asymmetric LVH and grade 2 diastolic dysfunction.  Tissue Doppler was abnormal with elevated left ventricular end-diastolic pressure.  She was without recurrent anginal symptoms.  Since her last visit, she continues to smoke cigarettes and a pack lasts approximately a day and a half.  She has no intention to quit.  She has noted some mild right leg swelling.  She has seen Dr. Roxan Hockey for follow-up.  She had recent laboratory which showed a potassium of 5.6.  She admits to drinking orange juice on a daily basis.  She denied any chest pain or palpitations.  She presents for evaluation.  Past Medical History:  Diagnosis Date  . CAD (coronary artery disease)   . Cancer ([place])   . Dyslipidemia   . GERD (gastroesophageal reflux disease)   . HTN (hypertension)   . Myocardial infarct ([place])   . Non-small cell lung cancer ([place]) 08/2010   Stage IA, status post left upper lobectomy July 2012  . Tobacco abuse     Past  Surgical History:  Procedure Laterality Date  . CARDIAC CATHETERIZATION  07/06/2010   see CABG report - pt sent to OR  . CARDIOVASCULAR STRESS TEST  09/11/2010   R/S MV - normal perfusion in all regions, EF 46%, no scintigraphic evidence of inducible myocardial ischemia; global LV systolic function mildly reduced; no significant wall motion abnormalities noted; Exercise capacity 7 METS; EKG negative for ischemia; low risk study, no signifcant change from previous study 08/2003  . CORONARY ARTERY BYPASS GRAFT  07/11/2010   LIMA to LAD; SVG to 2nd branch OM; SVG to posterior descending artery  . LEFT VATS  09/17/2010  . TEE WITHOUT CARDIOVERSION  07/06/2010   during emergent CABG surgery; 2-3+ mitral regurgitation    No Known Allergies  Current Outpatient Medications  Medication Sig Dispense Refill  . amLODipine (NORVASC) 5 MG tablet TAKE 1 TABLET BY MOUTH EVERY DAY 90 tablet 2  . aspirin 81 MG tablet Take 81 mg by mouth daily.    Marland Kitchen  Coenzyme Q10 300 MG CAPS Take by mouth.    . hydrochlorothiazide (MICROZIDE) 12.5 MG capsule Take 1 capsule (12.5 mg total) by mouth as needed. 30 capsule 3  . levothyroxine (SYNTHROID) 25 MCG tablet TAKE 0.5 TABLETS (12.5 MCG TOTAL) BY MOUTH DAILY. IN ADDITION TO THE 50 MCG. 45 tablet 1  . levothyroxine (SYNTHROID) 50 MCG tablet TAKE 1 TABLET BY MOUTH EVERY DAY BEFORE BREAKFAST 90 tablet 0  . metoprolol succinate (TOPROL-XL) 100 MG 24 hr tablet Take 1 tablet (100 mg total) by mouth daily. Take with or immediately following a meal. 90 tablet 3  . Omega-3 Fatty Acids (FISH OIL) 1000 MG CAPS Take 2,000 mg by mouth daily.    . rosuvastatin (CRESTOR) 40 MG tablet TAKE 1 TABLET BY MOUTH EVERY DAY 90 tablet 1  . Vitamin D, Ergocalciferol, (DRISDOL) 1.25 MG (50000 UT) CAPS capsule Take one tablet wkly (Patient not taking: Reported on 03/27/2020) 12 capsule 3   No current facility-administered medications for this visit.    Also she is married has 3 children 5  grandchildren. She is an ex-smoker. She does not routinely exercise. There is no alcohol use.  ROS General: Negative; No fevers, chills, or night sweats; positive for purposeful weight loss HEENT: Negative; No changes in vision or hearing, sinus congestion, difficulty swallowing Pulmonary: Negative; No cough, wheezing, shortness of breath, hemoptysis Cardiovascular: See HPI; mild right leg swelling GI: Negative; No nausea, vomiting, diarrhea, or abdominal pain GU: Negative; No dysuria, hematuria, or difficulty voiding Musculoskeletal: Negative; no myalgias, joint pain, or weakness Hematologic/Oncology: Negative; no easy bruising, bleeding Endocrine: Positive for insulin resistance; no heat/cold intolerance; no diabetes Neuro: Negative; no changes in balance, headaches Skin: Negative; No rashes or skin lesions Psychiatric: Negative; No behavioral problems, depression Sleep: Negative; No snoring, daytime sleepiness, hypersomnolence, bruxism, restless legs, hypnogognic hallucinations, no cataplexy Other comprehensive 14 point system review is negative.   PE BP 131/65 (BP Location: Right Arm, Patient Position: Sitting)   Pulse 74   Ht '5\' 4"'  (1.626 m)   Wt 217 lb (98.4 kg)   BMI 37.25 kg/m    Repeat blood pressure 110/68  Wt Readings from Last 3 Encounters:  03/27/20 217 lb (98.4 kg)  11/02/19 210 lb (95.3 kg)  08/11/19 214 lb (97.1 kg)   General: Alert, oriented, no distress.  Skin: normal turgor, no rashes, warm and dry HEENT: Normocephalic, atraumatic. Pupils equal round and reactive to light; sclera anicteric; extraocular muscles intact;  Nose without nasal septal hypertrophy Mouth/Parynx benign; Mallinpatti scale 3 Neck: No JVD, no carotid bruits; normal carotid upstroke Lungs: clear to ausculatation and percussion; no wheezing or rales Chest wall: without tenderness to palpitation Heart: PMI not displaced, RRR, s1 s2 normal, 1/6 systolic murmur, no diastolic murmur, no  rubs, gallops, thrills, or heaves Abdomen: soft, nontender; no hepatosplenomehaly, BS+; abdominal aorta nontender and not dilated by palpation. Back: no CVA tenderness Pulses 2+ Musculoskeletal: full range of motion, normal strength, no joint deformities Extremities: Trace right lower extremity swelling,  no clubbing cyanosis or edema, Homan's sign negative  Neurologic: grossly nonfocal; Cranial nerves grossly wnl Psychologic: Normal mood and affect  ECG (independently read by me): NSR at 74; QS V1-3, no ectopy, normal intervls  December 2020 ECG (independently read by me): Normal sinus rhythm at 74 bpm.  Poor R wave progression with QS complex V1 through V4.  No ectopy.  Normal intervals.  January 2020 ECG (independently read by me): Normal sinus rhythm at 73 bpm.  Anteroseptal  QS complex  January 2019 ECG (independently read by me): normal sinus rhythm at 76 bpm.  Q wave in lead 3.  Nonspecific T changes.  October 2018 ECG (independently read by me): Normal sinus rhythm at 73 bpm.  Q wave in 3 and very small Q wave in aVF.  Nonspecific T changes anteriorly.  Normal intervals.  October 2017 ECG (independently read by me): Normal sinus rhythm at 65 bpm.  Nonspecific T changes.  Q wave in lead 3.  October 2016 ECG (independently read by me): Normal sinus rhythm at 77 bpm.  Nonspecific T changes anteriorly which are old.  Normal intervals.  September 2015 ECG (independently read by me): Normal sinus rhythm at 70 beats per minute.  Poor progression anteriorly.  Small Q-wave in lead 3.  Nonspecific T changes anteriorly, unchanged  04/16/2013 ECG (independently read by me): Normal sinus rhythm at 65 beats per minute. T wave changes V1 through V6, nonspecific, unchanged. QTc interval 455 ms  Prior ECG of October 15 2012: NSR @ 62 bpm with T-wave abnormality anteriorly V1 through V5 unchanged.  LABS: BMP Latest Ref Rng & Units 03/21/2020 10/12/2019 09/13/2019  Glucose 65 - 99 mg/dL 99 102(H) 94   BUN 8 - 27 mg/dL '10 9 9  ' Creatinine 0.57 - 1.00 mg/dL 0.79 0.79 0.79  BUN/Creat Ratio 12 - 28 13 11(L) 11(L)  Sodium 134 - 144 mmol/L 142 142 143  Potassium 3.5 - 5.2 mmol/L 5.6(H) 5.4(H) 5.6(H)  Chloride 96 - 106 mmol/L 102 101 104  CO2 20 - 29 mmol/L '29 27 26  ' Calcium 8.7 - 10.3 mg/dL 9.6 9.8 9.5   Hepatic Function Latest Ref Rng & Units 03/21/2020 09/13/2019 01/22/2019  Total Protein 6.0 - 8.5 g/dL 7.1 6.9 6.9  Albumin 3.7 - 4.7 g/dL 4.2 4.2 4.1  AST 0 - 40 IU/L '21 21 23  ' ALT 0 - 32 IU/L '30 26 31  ' Alk Phosphatase 44 - 121 IU/L 72 71 74  Total Bilirubin 0.0 - 1.2 mg/dL 0.3 0.3 0.3   CBC Latest Ref Rng & Units 03/21/2020 09/13/2019 01/22/2019  WBC 3.4 - 10.8 x10E3/uL 9.3 9.7 9.0  Hemoglobin 11.1 - 15.9 g/dL 15.6 15.2 15.4  Hematocrit 34.0 - 46.6 % 47.9(H) 47.2(H) 46.9(H)  Platelets 150 - 450 x10E3/uL 205 217 203   Lab Results  Component Value Date   MCV 89 03/21/2020   MCV 87 09/13/2019   MCV 89 01/22/2019   Lab Results  Component Value Date   TSH 4.400 03/21/2020   Lab Results  Component Value Date   HGBA1C 6.1 (H) 06/14/2019   Lipid Panel     Component Value Date/Time   CHOL 123 03/21/2020 0855   CHOL 114 07/22/2012 0840   TRIG 119 03/21/2020 0855   TRIG 170 (H) 07/22/2012 0840   HDL 46 03/21/2020 0855   HDL 42 07/22/2012 0840   CHOLHDL 2.7 03/21/2020 0855   CHOLHDL 3.2 06/26/2016 0859   VLDL 25 06/26/2016 0859   LDLCALC 56 03/21/2020 0855   LDLCALC 38 07/22/2012 0840      RADIOLOGY: No results found.  IMPRESSION:  1. History of acute anterior wall MI: VF arrest 1997   2. Hx of CABG: 2012   3. Hyperkalemia   4. Hyperlipidemia with target LDL less than 70   5. Acquired hypothyroidism   6. Tobacco abuse   7. History of lung cancer   8. Moderate obesity   9. Medication management     ASSESSMENT AND  PLAN: Ms. Gladwin is a 75 year old Caucasian female who is 24 years following her initial anterior wall myocardial infarction with VF arrest for which she  underwent PTCA emergently of her LAD in 1997. She is 9 1/2 years following emergency CABG surgery for life-threatening anatomy with 99% left main disease and total occlusion of her RCA at the ostium with left to right collaterals. Her ejection fraction did improve and is now 50-55% with small area of subtle LAD scar.  A follow-up myocardial perfusion study on 11/05/2016 continued to show normal perfusion without scar or ischemia .  She was fortuitously found to have cancer, stage Ia, non-small cell carcinoma and has been without recurrence.  Her blood pressure today is stable on a regimen consisting of amlodipine 5 mg, and metoprolol succinate 100 mg.  She is not having any palpitations.  She continues to be on rosuvastatin 40 mg daily.  Most recent lipid studies from March 21, 2020 are excellent with total cholesterol 123 and LDL cholesterol 56.  She continues to be on levothyroxine at 62.5 mcg.  TSH is normal at 4.4.  Most recent chemistry revealed elevated potassium at 5.6.  I recommended that she discontinue drinking her daily orange juice and be cognizant of potassium containing foods.  She does have some leg swelling.  I have given her a prescription for HCTZ to take 12.5 mg for swelling and then on an as-needed basis.  He should also slightly decrease her potassium.  I have recommended follow-up to be met for potassium reassessment.  We discussed the importance of smoking cessation.  She has no interest to quit.  She will be driving to [place] in the next several weeks.  We discussed the long drive and dependency potentially increasing her leg swelling.  BMI is consistent with moderate obesity.  Weight loss was recommended.  I will see her in 6 months for evaluation.  Troy Sine, MD, [facility]  03/29/2020 12:54 PM

## 2020-03-27 NOTE — Patient Instructions (Signed)
Medication Instructions:  Take hydrochlorothiazide (HCTZ) 12.5 mg AS NEEDED for swelling  *If you need a refill on your cardiac medications before your next appointment, please call your pharmacy*   Lab Work: ---STOP Orange juice BMET END OF THIS WEEK (Thursday OR Friday)  Our in office lab hours are Monday-Friday 8:00-4:00, closed for lunch 12:45-1:45 pm.  No appointment needed.   Follow-Up: At [facility], you and your health needs are our priority.  As part of our continuing mission to provide you with exceptional heart care, we have created designated Provider Care Teams.  These Care Teams include your primary Cardiologist (physician) and Advanced Practice Providers (APPs -  Physician Assistants and Nurse Practitioners) who all work together to provide you with the care you need, when you need it.  We recommend signing up for the patient portal called "MyChart".  Sign up information is provided on this After Visit Summary.  MyChart is used to connect with patients for Virtual Visits (Telemedicine).  Patients are able to view lab/test results, encounter notes, upcoming appointments, etc.  Non-urgent messages can be sent to your provider as well.   To learn more about what you can do with MyChart, go to [email or web address].    Your next appointment:   6 month(s)  The format for your next appointment:   In Person  Provider:   Dr. Claiborne Billings

## 2020-03-29 ENCOUNTER — Encounter: Payer: Self-pay | Admitting: Cardiovascular Disease

## 2020-04-04 DIAGNOSIS — Z79899 Other long term (current) drug therapy: Secondary | ICD-10-CM | POA: Diagnosis not present

## 2020-04-04 DIAGNOSIS — E875 Hyperkalemia: Secondary | ICD-10-CM | POA: Diagnosis not present

## 2020-04-05 LAB — BASIC METABOLIC PANEL
BUN/Creatinine Ratio: 14 (ref 12–28)
BUN: 10 mg/dL (ref 8–27)
CO2: 26 mmol/L (ref 20–29)
Calcium: 9.9 mg/dL (ref 8.7–10.3)
Chloride: 100 mmol/L (ref 96–106)
Creatinine, Ser: 0.71 mg/dL (ref 0.57–1.00)
GFR calc Af Amer: 98 mL/min/{1.73_m2} (ref >59)
GFR calc non Af Amer: 85 mL/min/{1.73_m2} (ref >59)
Glucose: 106 mg/dL — ABNORMAL HIGH (ref 65–99)
Potassium: 4.9 mmol/L (ref 3.5–5.2)
Sodium: 140 mmol/L (ref 134–144)

## 2020-04-06 ENCOUNTER — Ambulatory Visit (INDEPENDENT_AMBULATORY_CARE_PROVIDER_SITE_OTHER): Payer: Medicare HMO | Admitting: Physician Assistant

## 2020-04-06 ENCOUNTER — Encounter: Payer: Self-pay | Admitting: Physician Assistant

## 2020-04-06 VITALS — Ht 64.0 in | Wt 217.0 lb

## 2020-04-06 DIAGNOSIS — Z0000 Encounter for general adult medical examination without abnormal findings: Secondary | ICD-10-CM | POA: Diagnosis not present

## 2020-04-06 NOTE — Progress Notes (Signed)
Virtual Visit via Telephone Note:  I connected with Lonni Fix by telephone and verified that I am speaking with the correct person using two identifiers.    I discussed the limitations, risks, security and privacy concerns for performing an evaluation and management service by telephone and the availability of in person appointments. The staff discussed with patient that there may be a patient responsible charge related to this service. The patient expressed understanding and agreed to proceed.   Location of Patient- Home Location of Provider- Office   Subjective:   [name] is a 75 y.o. female who presents for Medicare Annual (Subsequent) preventive examination.  Review of Systems    General:   No F/C, wt loss Pulm:   No DIB, SOB, pleuritic chest pain Card:  No CP, palpitations Abd:  No n/v/d or pain Ext: +edema     Objective:    Today's Vitals   04/06/20 1142  Weight: 217 lb (98.4 kg)  Height: 5\' 4"  (1.626 m)   Body mass index is 37.25 kg/m.  Advanced Directives 01/16/2017  Does Patient Have a Medical Advance Directive? Yes  Type of Advance Directive Living will    Current Medications (verified) Outpatient Encounter Medications as of 04/06/2020  Medication Sig  . amLODipine (NORVASC) 5 MG tablet TAKE 1 TABLET BY MOUTH EVERY DAY  . aspirin 81 MG tablet Take 81 mg by mouth daily.  . Coenzyme Q10 300 MG CAPS Take by mouth.  . levothyroxine (SYNTHROID) 25 MCG tablet TAKE 0.5 TABLETS (12.5 MCG TOTAL) BY MOUTH DAILY. IN ADDITION TO THE 50 MCG.  . levothyroxine (SYNTHROID) 50 MCG tablet TAKE 1 TABLET BY MOUTH EVERY DAY BEFORE BREAKFAST  . metoprolol succinate (TOPROL-XL) 100 MG 24 hr tablet Take 1 tablet (100 mg total) by mouth daily. Take with or immediately following a meal.  . Omega-3 Fatty Acids (FISH OIL) 1000 MG CAPS Take 2,000 mg by mouth daily.  . rosuvastatin (CRESTOR) 40 MG tablet TAKE 1 TABLET BY MOUTH EVERY DAY  . hydrochlorothiazide (MICROZIDE)  12.5 MG capsule Take 1 capsule (12.5 mg total) by mouth as needed. (Patient not taking: Reported on 04/06/2020)  . Vitamin D, Ergocalciferol, (DRISDOL) 1.25 MG (50000 UT) CAPS capsule Take one tablet wkly (Patient not taking: Reported on 03/27/2020)   No facility-administered encounter medications on file as of 04/06/2020.    Allergies (verified) Patient has no known allergies.   History: Past Medical History:  Diagnosis Date  . CAD (coronary artery disease)   . Cancer ([place])   . Dyslipidemia   . GERD (gastroesophageal reflux disease)   . HTN (hypertension)   . Myocardial infarct ([place])   . Non-small cell lung cancer ([place]) 08/2010   Stage IA, status post left upper lobectomy July 2012  . Tobacco abuse    Past Surgical History:  Procedure Laterality Date  . CARDIAC CATHETERIZATION  07/06/2010   see CABG report - pt sent to OR  . CARDIOVASCULAR STRESS TEST  09/11/2010   R/S MV - normal perfusion in all regions, EF 46%, no scintigraphic evidence of inducible myocardial ischemia; global LV systolic function mildly reduced; no significant wall motion abnormalities noted; Exercise capacity 7 METS; EKG negative for ischemia; low risk study, no signifcant change from previous study 08/2003  . CORONARY ARTERY BYPASS GRAFT  07/11/2010   LIMA to LAD; SVG to 2nd branch OM; SVG to posterior descending artery  . LEFT VATS  09/17/2010  . TEE WITHOUT CARDIOVERSION  07/06/2010   during  emergent CABG surgery; 2-3+ mitral regurgitation   Family History  Problem Relation Age of Onset  . Hypothyroidism Father   . Heart attack Father   . Aplastic anemia Maternal Grandfather   . Stroke Paternal Grandfather    Social History   Socioeconomic History  . Marital status: Married    Spouse name: Not on file  . Number of children: Not on file  . Years of education: Not on file  . Highest education level: Not on file  Occupational History  . Not on file  Tobacco Use  . Smoking status: Current Every Day  Smoker    Packs/day: 0.50    Years: 45.00    Pack years: 22.50    Types: Cigarettes  . Smokeless tobacco: Never Used  . Tobacco comment: on and off since lobectomy  Vaping Use  . Vaping Use: Never used  Substance and Sexual Activity  . Alcohol use: No    Alcohol/week: 0.0 standard drinks  . Drug use: No  . Sexual activity: Never  Other Topics Concern  . Not on file  Social History Narrative  . Not on file   Social Determinants of Health   Financial Resource Strain: Not on file  Food Insecurity: Not on file  Transportation Needs: Not on file  Physical Activity: Not on file  Stress: Not on file  Social Connections: Not on file    Tobacco Counseling Ready to quit: Not Answered Counseling given: Not Answered Comment: on and off since lobectomy    Diabetic? No    Activities of Daily Living In your present state of health, do you have any difficulty performing the following activities: 04/06/2020  Hearing? N  Vision? N  Difficulty concentrating or making decisions? N  Walking or climbing stairs? N  Dressing or bathing? N  Doing errands, shopping? N  Some recent data might be hidden    Patient Care Team: Lorrene Reid, PA-C as PCP - General Melrose Nakayama, MD (Cardiothoracic Surgery) Troy Sine, MD (Cardiology) Wonda Horner, MD as Consulting Physician (Gastroenterology)  Indicate any recent Medical Services you may have received from other than [facility] providers in the past year (date may be approximate).     Assessment:   This is a routine wellness examination for [facility].  Hearing/Vision screen No exam data present  Dietary issues and exercise activities discussed: -Continue low sodium diet, follow heart healthy diet and recommend to increase physical activity.   Goals   None    Depression Screen PHQ 2/9 Scores 04/06/2020 06/14/2019 01/27/2019 12/14/2018 08/13/2018 05/15/2017 05/15/2017  PHQ - 2 Score 0 0 2 0 0 1 1  PHQ- 9 Score 0 0 2 0 0 1  -    Fall Risk Fall Risk  04/06/2020 01/27/2019 08/13/2018 05/15/2017 01/16/2017  Falls in the past year? 0 0 0 No No  Number falls in past yr: - 0 - - -  Injury with Fall? - 0 - - -  Follow up Falls evaluation completed Falls evaluation completed Falls evaluation completed - -    FALL RISK PREVENTION PERTAINING TO THE HOME:  Any stairs in or around the home? No  If so, are there any without handrails? N/A Home free of loose throw rugs in walkways, pet beds, electrical cords, etc? No  Adequate lighting in your home to reduce risk of falls? Yes   ASSISTIVE DEVICES UTILIZED TO PREVENT FALLS:  Life alert? No  Use of a cane, walker or w/c? No  Grab  bars in the bathroom? No  Shower chair or bench in shower? Yes  Elevated toilet seat or a handicapped toilet? No   TIMED UP AND GO:  Was the test performed? No .  Length of time to ambulate 10 feet: 0 sec.   Telehealth  Cognitive Function: wnl    6CIT Screen 04/06/2020 01/27/2019  What Year? 0 points 0 points  What month? 0 points 0 points  What time? 0 points 0 points  Count back from 20 0 points 0 points  Months in reverse 0 points 0 points  Repeat phrase 0 points 0 points  Total Score 0 0    Immunizations Immunization History  Administered Date(s) Administered  . Pneumococcal Polysaccharide-23 01/28/2019  . Tdap 02/02/2019  . Zoster Recombinat (Shingrix) 06/14/2019    TDAP status: Up to date  Flu Vaccine status: Up to date  Pneumococcal vaccine status: Due, Education has been provided regarding the importance of this vaccine. Advised may receive this vaccine at local pharmacy or Health Dept. Aware to provide a copy of the vaccination record if obtained from local pharmacy or Health Dept. Verbalized acceptance and understanding.  Covid-19 vaccine status: Declined, Education has been provided regarding the importance of this vaccine but patient still declined. Advised may receive this vaccine at local pharmacy or Health  Dept.or vaccine clinic. Aware to provide a copy of the vaccination record if obtained from local pharmacy or Health Dept. Verbalized acceptance and understanding.  Qualifies for Shingles Vaccine? Yes   Zostavax completed No   Shingrix Completed?: No.    Education has been provided regarding the importance of this vaccine. Patient has been advised to call insurance company to determine out of pocket expense if they have not yet received this vaccine. Advised may also receive vaccine at local pharmacy or Health Dept. Verbalized acceptance and understanding.  Screening Tests Health Maintenance  Topic Date Due  . Hepatitis C Screening  Never done  . COVID-19 Vaccine (1) Never done  . INFLUENZA VACCINE  Never done  . PNA vac Low Risk Adult (2 of 2 - PCV13) 01/28/2020  . COLONOSCOPY (Pts 45-54yrs Insurance coverage will need to be confirmed)  11/11/2020  . MAMMOGRAM  05/24/2021  . TETANUS/TDAP  02/01/2029  . DEXA SCAN  Completed    Health Maintenance  Health Maintenance Due  Topic Date Due  . Hepatitis C Screening  Never done  . COVID-19 Vaccine (1) Never done  . INFLUENZA VACCINE  Never done  . PNA vac Low Risk Adult (2 of 2 - PCV13) 01/28/2020    Colorectal cancer screening: Type of screening: Colonoscopy. Completed 11/12/2010. Repeat every 10 years  Mammogram status: No longer required due to Patient declined.  Dexa: Pt declined  Lung Cancer Screening: (Low Dose CT Chest recommended if Age 36-80 years, 30 pack-year currently smoking OR have quit w/in 15years.) does qualify.   Lung Cancer Screening Referral: Pt declined   Additional Screening:  Hepatitis C Screening: does qualify; Completed Pt Declined  Vision Screening: Recommended annual ophthalmology exams for early detection of glaucoma and other disorders of the eye. Is the patient up to date with their annual eye exam?  Yes  Who is the provider or what is the name of the office in which the patient attends annual eye  exams?  If pt is not established with a provider, would they like to be referred to a provider to establish care? No .   Dental Screening: Recommended annual dental exams for proper oral  hygiene  Community Resource Referral / Chronic Care Management: CRR required this visit?  No   CCM required this visit?  No      Plan:  -Continue to follow up with Cardiology and Pulmonology. -Continue current medication regimen. -Discussed most recent labs which are essentially within normal limits or stable from prior. Repeat potassium has normalized. Recommend to continue with reduced orange juice. -Recommend elevation and take HCTZ as needed for edema. -Follow up in 6 months for HTN, thyroid, edema  I have personally reviewed and noted the following in the patient's chart:   . Medical and social history . Use of alcohol, tobacco or illicit drugs  . Current medications and supplements . Functional ability and status . Nutritional status . Physical activity . Advanced directives . List of other physicians . Hospitalizations, surgeries, and ER visits in previous 12 months . Vitals . Screenings to include cognitive, depression, and falls . Referrals and appointments  In addition, I have reviewed and discussed with patient certain preventive protocols, quality metrics, and best practice recommendations. A written personalized care plan for preventive services as well as general preventive health recommendations were provided to patient.

## 2020-04-06 NOTE — Patient Instructions (Addendum)
Edema  Edema is when you have too much fluid in your body or under your skin. Edema may make your legs, feet, and ankles swell up. Swelling is also common in looser tissues, like around your eyes. This is a common condition. It gets more common as you get older. There are many possible causes of edema. Eating too much salt (sodium) and being on your feet or sitting for a long time can cause edema in your legs, feet, and ankles. Hot weather may make edema worse. Edema is usually painless. Your skin may look swollen or shiny. Follow these instructions at home:  Keep the swollen body part raised (elevated) above the level of your heart when you are sitting or lying down.  Do not sit still or stand for a long time.  Do not wear tight clothes. Do not wear garters on your upper legs.  Exercise your legs. This can help the swelling go down.  Wear elastic bandages or support stockings as told by your doctor.  Eat a low-salt (low-sodium) diet to reduce fluid as told by your doctor.  Depending on the cause of your swelling, you may need to limit how much fluid you drink (fluid restriction).  Take over-the-counter and prescription medicines only as told by your doctor. Contact a doctor if:  Treatment is not working.  You have heart, liver, or kidney disease and have symptoms of edema.  You have sudden and unexplained weight gain. Get help right away if:  You have shortness of breath or chest pain.  You cannot breathe when you lie down.  You have pain, redness, or warmth in the swollen areas.  You have heart, liver, or kidney disease and get edema all of a sudden.  You have a fever and your symptoms get worse all of a sudden. Summary  Edema is when you have too much fluid in your body or under your skin.  Edema may make your legs, feet, and ankles swell up. Swelling is also common in looser tissues, like around your eyes.  Raise (elevate) the swollen body part above the level of your  heart when you are sitting or lying down.  Follow your doctor's instructions about diet and how much fluid you can drink (fluid restriction). This information is not intended to replace advice given to you by your health care provider. Make sure you discuss any questions you have with your health care provider. Document Revised: 12/01/2019 Document Reviewed: 12/01/2019 Elsevier Patient Education  2021 [employer]  

## 2020-05-05 DIAGNOSIS — J811 Chronic pulmonary edema: Secondary | ICD-10-CM | POA: Diagnosis not present

## 2020-05-05 DIAGNOSIS — I509 Heart failure, unspecified: Secondary | ICD-10-CM | POA: Diagnosis not present

## 2020-05-05 DIAGNOSIS — I480 Paroxysmal atrial fibrillation: Secondary | ICD-10-CM | POA: Diagnosis not present

## 2020-05-05 DIAGNOSIS — J9690 Respiratory failure, unspecified, unspecified whether with hypoxia or hypercapnia: Secondary | ICD-10-CM | POA: Diagnosis not present

## 2020-05-05 DIAGNOSIS — J1000 Influenza due to other identified influenza virus with unspecified type of pneumonia: Secondary | ICD-10-CM | POA: Diagnosis not present

## 2020-05-05 DIAGNOSIS — J441 Chronic obstructive pulmonary disease with (acute) exacerbation: Secondary | ICD-10-CM | POA: Diagnosis not present

## 2020-05-05 DIAGNOSIS — Z20822 Contact with and (suspected) exposure to covid-19: Secondary | ICD-10-CM | POA: Diagnosis not present

## 2020-05-05 DIAGNOSIS — D500 Iron deficiency anemia secondary to blood loss (chronic): Secondary | ICD-10-CM | POA: Diagnosis not present

## 2020-05-05 DIAGNOSIS — I4892 Unspecified atrial flutter: Secondary | ICD-10-CM | POA: Diagnosis not present

## 2020-05-05 DIAGNOSIS — J101 Influenza due to other identified influenza virus with other respiratory manifestations: Secondary | ICD-10-CM | POA: Insufficient documentation

## 2020-05-05 DIAGNOSIS — J9600 Acute respiratory failure, unspecified whether with hypoxia or hypercapnia: Secondary | ICD-10-CM | POA: Diagnosis not present

## 2020-05-05 DIAGNOSIS — K922 Gastrointestinal hemorrhage, unspecified: Secondary | ICD-10-CM | POA: Diagnosis not present

## 2020-05-05 DIAGNOSIS — I2510 Atherosclerotic heart disease of native coronary artery without angina pectoris: Secondary | ICD-10-CM | POA: Diagnosis not present

## 2020-05-05 DIAGNOSIS — Z4682 Encounter for fitting and adjustment of non-vascular catheter: Secondary | ICD-10-CM | POA: Diagnosis not present

## 2020-05-05 DIAGNOSIS — I5023 Acute on chronic systolic (congestive) heart failure: Secondary | ICD-10-CM | POA: Diagnosis not present

## 2020-05-05 DIAGNOSIS — Z452 Encounter for adjustment and management of vascular access device: Secondary | ICD-10-CM | POA: Diagnosis not present

## 2020-05-05 DIAGNOSIS — Z951 Presence of aortocoronary bypass graft: Secondary | ICD-10-CM | POA: Diagnosis not present

## 2020-05-05 DIAGNOSIS — J9811 Atelectasis: Secondary | ICD-10-CM | POA: Diagnosis not present

## 2020-05-05 DIAGNOSIS — J09X2 Influenza due to identified novel influenza A virus with other respiratory manifestations: Secondary | ICD-10-CM | POA: Diagnosis not present

## 2020-05-05 DIAGNOSIS — J810 Acute pulmonary edema: Secondary | ICD-10-CM | POA: Diagnosis not present

## 2020-05-05 DIAGNOSIS — J9601 Acute respiratory failure with hypoxia: Secondary | ICD-10-CM | POA: Diagnosis not present

## 2020-05-05 DIAGNOSIS — R0902 Hypoxemia: Secondary | ICD-10-CM | POA: Diagnosis not present

## 2020-05-05 DIAGNOSIS — R7989 Other specified abnormal findings of blood chemistry: Secondary | ICD-10-CM | POA: Diagnosis not present

## 2020-05-05 DIAGNOSIS — I10 Essential (primary) hypertension: Secondary | ICD-10-CM | POA: Diagnosis not present

## 2020-05-05 DIAGNOSIS — N179 Acute kidney failure, unspecified: Secondary | ICD-10-CM | POA: Diagnosis not present

## 2020-05-05 DIAGNOSIS — Z85118 Personal history of other malignant neoplasm of bronchus and lung: Secondary | ICD-10-CM | POA: Diagnosis not present

## 2020-05-05 DIAGNOSIS — I5021 Acute systolic (congestive) heart failure: Secondary | ICD-10-CM | POA: Diagnosis not present

## 2020-05-05 DIAGNOSIS — G9341 Metabolic encephalopathy: Secondary | ICD-10-CM | POA: Insufficient documentation

## 2020-05-05 DIAGNOSIS — J9621 Acute and chronic respiratory failure with hypoxia: Secondary | ICD-10-CM | POA: Diagnosis not present

## 2020-05-05 DIAGNOSIS — I21A1 Myocardial infarction type 2: Secondary | ICD-10-CM | POA: Diagnosis not present

## 2020-05-05 DIAGNOSIS — Z9911 Dependence on respirator [ventilator] status: Secondary | ICD-10-CM | POA: Diagnosis not present

## 2020-05-05 DIAGNOSIS — C3490 Malignant neoplasm of unspecified part of unspecified bronchus or lung: Secondary | ICD-10-CM | POA: Diagnosis not present

## 2020-05-05 DIAGNOSIS — Z9981 Dependence on supplemental oxygen: Secondary | ICD-10-CM | POA: Diagnosis not present

## 2020-05-05 DIAGNOSIS — Z902 Acquired absence of lung [part of]: Secondary | ICD-10-CM | POA: Diagnosis not present

## 2020-05-05 DIAGNOSIS — R918 Other nonspecific abnormal finding of lung field: Secondary | ICD-10-CM | POA: Diagnosis not present

## 2020-05-05 DIAGNOSIS — J9622 Acute and chronic respiratory failure with hypercapnia: Secondary | ICD-10-CM | POA: Diagnosis not present

## 2020-05-05 DIAGNOSIS — R0602 Shortness of breath: Secondary | ICD-10-CM | POA: Diagnosis not present

## 2020-05-05 DIAGNOSIS — D509 Iron deficiency anemia, unspecified: Secondary | ICD-10-CM | POA: Diagnosis not present

## 2020-05-05 DIAGNOSIS — J9602 Acute respiratory failure with hypercapnia: Secondary | ICD-10-CM | POA: Diagnosis not present

## 2020-05-06 DIAGNOSIS — J101 Influenza due to other identified influenza virus with other respiratory manifestations: Secondary | ICD-10-CM | POA: Diagnosis not present

## 2020-05-06 DIAGNOSIS — I10 Essential (primary) hypertension: Secondary | ICD-10-CM | POA: Diagnosis not present

## 2020-05-06 DIAGNOSIS — R7989 Other specified abnormal findings of blood chemistry: Secondary | ICD-10-CM | POA: Diagnosis not present

## 2020-05-06 DIAGNOSIS — I21A1 Myocardial infarction type 2: Secondary | ICD-10-CM | POA: Diagnosis not present

## 2020-05-06 DIAGNOSIS — J9602 Acute respiratory failure with hypercapnia: Secondary | ICD-10-CM | POA: Diagnosis not present

## 2020-05-06 DIAGNOSIS — Z9911 Dependence on respirator [ventilator] status: Secondary | ICD-10-CM | POA: Diagnosis not present

## 2020-05-06 DIAGNOSIS — J441 Chronic obstructive pulmonary disease with (acute) exacerbation: Secondary | ICD-10-CM | POA: Diagnosis not present

## 2020-05-06 DIAGNOSIS — G9341 Metabolic encephalopathy: Secondary | ICD-10-CM | POA: Diagnosis not present

## 2020-05-06 DIAGNOSIS — J9601 Acute respiratory failure with hypoxia: Secondary | ICD-10-CM | POA: Diagnosis not present

## 2020-05-06 DIAGNOSIS — J09X2 Influenza due to identified novel influenza A virus with other respiratory manifestations: Secondary | ICD-10-CM | POA: Diagnosis not present

## 2020-05-07 DIAGNOSIS — J09X2 Influenza due to identified novel influenza A virus with other respiratory manifestations: Secondary | ICD-10-CM | POA: Diagnosis not present

## 2020-05-07 DIAGNOSIS — R7989 Other specified abnormal findings of blood chemistry: Secondary | ICD-10-CM | POA: Diagnosis not present

## 2020-05-07 DIAGNOSIS — I21A1 Myocardial infarction type 2: Secondary | ICD-10-CM | POA: Diagnosis not present

## 2020-05-07 DIAGNOSIS — Z9911 Dependence on respirator [ventilator] status: Secondary | ICD-10-CM | POA: Diagnosis not present

## 2020-05-07 DIAGNOSIS — J101 Influenza due to other identified influenza virus with other respiratory manifestations: Secondary | ICD-10-CM | POA: Diagnosis not present

## 2020-05-07 DIAGNOSIS — J9602 Acute respiratory failure with hypercapnia: Secondary | ICD-10-CM | POA: Diagnosis not present

## 2020-05-07 DIAGNOSIS — J441 Chronic obstructive pulmonary disease with (acute) exacerbation: Secondary | ICD-10-CM | POA: Diagnosis not present

## 2020-05-07 DIAGNOSIS — G9341 Metabolic encephalopathy: Secondary | ICD-10-CM | POA: Diagnosis not present

## 2020-05-07 DIAGNOSIS — J9601 Acute respiratory failure with hypoxia: Secondary | ICD-10-CM | POA: Diagnosis not present

## 2020-05-07 DIAGNOSIS — I10 Essential (primary) hypertension: Secondary | ICD-10-CM | POA: Diagnosis not present

## 2020-05-08 DIAGNOSIS — I2510 Atherosclerotic heart disease of native coronary artery without angina pectoris: Secondary | ICD-10-CM | POA: Diagnosis not present

## 2020-05-08 DIAGNOSIS — J9601 Acute respiratory failure with hypoxia: Secondary | ICD-10-CM | POA: Diagnosis not present

## 2020-05-08 DIAGNOSIS — J9602 Acute respiratory failure with hypercapnia: Secondary | ICD-10-CM | POA: Diagnosis not present

## 2020-05-08 DIAGNOSIS — J101 Influenza due to other identified influenza virus with other respiratory manifestations: Secondary | ICD-10-CM | POA: Diagnosis not present

## 2020-05-08 DIAGNOSIS — I5021 Acute systolic (congestive) heart failure: Secondary | ICD-10-CM | POA: Insufficient documentation

## 2020-05-09 DIAGNOSIS — I5021 Acute systolic (congestive) heart failure: Secondary | ICD-10-CM | POA: Diagnosis not present

## 2020-05-09 DIAGNOSIS — J9601 Acute respiratory failure with hypoxia: Secondary | ICD-10-CM | POA: Diagnosis not present

## 2020-05-09 DIAGNOSIS — J101 Influenza due to other identified influenza virus with other respiratory manifestations: Secondary | ICD-10-CM | POA: Diagnosis not present

## 2020-05-09 DIAGNOSIS — J9602 Acute respiratory failure with hypercapnia: Secondary | ICD-10-CM | POA: Diagnosis not present

## 2020-05-09 DIAGNOSIS — I2510 Atherosclerotic heart disease of native coronary artery without angina pectoris: Secondary | ICD-10-CM | POA: Diagnosis not present

## 2020-05-10 DIAGNOSIS — J9601 Acute respiratory failure with hypoxia: Secondary | ICD-10-CM | POA: Diagnosis not present

## 2020-05-10 DIAGNOSIS — J101 Influenza due to other identified influenza virus with other respiratory manifestations: Secondary | ICD-10-CM | POA: Diagnosis not present

## 2020-05-10 DIAGNOSIS — I5021 Acute systolic (congestive) heart failure: Secondary | ICD-10-CM | POA: Diagnosis not present

## 2020-05-10 DIAGNOSIS — J9602 Acute respiratory failure with hypercapnia: Secondary | ICD-10-CM | POA: Diagnosis not present

## 2020-05-11 DIAGNOSIS — J9601 Acute respiratory failure with hypoxia: Secondary | ICD-10-CM | POA: Diagnosis not present

## 2020-05-11 DIAGNOSIS — J101 Influenza due to other identified influenza virus with other respiratory manifestations: Secondary | ICD-10-CM | POA: Diagnosis not present

## 2020-05-11 DIAGNOSIS — J9602 Acute respiratory failure with hypercapnia: Secondary | ICD-10-CM | POA: Diagnosis not present

## 2020-05-11 DIAGNOSIS — I5021 Acute systolic (congestive) heart failure: Secondary | ICD-10-CM | POA: Diagnosis not present

## 2020-05-12 DIAGNOSIS — I5021 Acute systolic (congestive) heart failure: Secondary | ICD-10-CM | POA: Diagnosis not present

## 2020-05-12 DIAGNOSIS — J9602 Acute respiratory failure with hypercapnia: Secondary | ICD-10-CM | POA: Diagnosis not present

## 2020-05-12 DIAGNOSIS — G9341 Metabolic encephalopathy: Secondary | ICD-10-CM | POA: Diagnosis not present

## 2020-05-12 DIAGNOSIS — J101 Influenza due to other identified influenza virus with other respiratory manifestations: Secondary | ICD-10-CM | POA: Diagnosis not present

## 2020-05-12 DIAGNOSIS — R0602 Shortness of breath: Secondary | ICD-10-CM | POA: Diagnosis not present

## 2020-05-12 DIAGNOSIS — J9601 Acute respiratory failure with hypoxia: Secondary | ICD-10-CM | POA: Diagnosis not present

## 2020-05-13 DIAGNOSIS — I2510 Atherosclerotic heart disease of native coronary artery without angina pectoris: Secondary | ICD-10-CM | POA: Diagnosis not present

## 2020-05-13 DIAGNOSIS — I5021 Acute systolic (congestive) heart failure: Secondary | ICD-10-CM | POA: Diagnosis not present

## 2020-05-13 DIAGNOSIS — J9601 Acute respiratory failure with hypoxia: Secondary | ICD-10-CM | POA: Diagnosis not present

## 2020-05-13 DIAGNOSIS — J9602 Acute respiratory failure with hypercapnia: Secondary | ICD-10-CM | POA: Diagnosis not present

## 2020-05-14 DIAGNOSIS — J9602 Acute respiratory failure with hypercapnia: Secondary | ICD-10-CM | POA: Diagnosis not present

## 2020-05-14 DIAGNOSIS — G9341 Metabolic encephalopathy: Secondary | ICD-10-CM | POA: Diagnosis not present

## 2020-05-14 DIAGNOSIS — J9601 Acute respiratory failure with hypoxia: Secondary | ICD-10-CM | POA: Diagnosis not present

## 2020-05-14 DIAGNOSIS — J101 Influenza due to other identified influenza virus with other respiratory manifestations: Secondary | ICD-10-CM | POA: Diagnosis not present

## 2020-05-14 DIAGNOSIS — I5021 Acute systolic (congestive) heart failure: Secondary | ICD-10-CM | POA: Diagnosis not present

## 2020-05-15 DIAGNOSIS — J9602 Acute respiratory failure with hypercapnia: Secondary | ICD-10-CM | POA: Diagnosis not present

## 2020-05-15 DIAGNOSIS — J9601 Acute respiratory failure with hypoxia: Secondary | ICD-10-CM | POA: Diagnosis not present

## 2020-05-16 DIAGNOSIS — J9601 Acute respiratory failure with hypoxia: Secondary | ICD-10-CM | POA: Diagnosis not present

## 2020-05-16 DIAGNOSIS — D62 Acute posthemorrhagic anemia: Secondary | ICD-10-CM | POA: Insufficient documentation

## 2020-05-16 DIAGNOSIS — J9602 Acute respiratory failure with hypercapnia: Secondary | ICD-10-CM | POA: Diagnosis not present

## 2020-05-22 DIAGNOSIS — J441 Chronic obstructive pulmonary disease with (acute) exacerbation: Secondary | ICD-10-CM | POA: Diagnosis not present

## 2020-05-22 DIAGNOSIS — J9602 Acute respiratory failure with hypercapnia: Secondary | ICD-10-CM | POA: Diagnosis not present

## 2020-05-22 DIAGNOSIS — J9601 Acute respiratory failure with hypoxia: Secondary | ICD-10-CM | POA: Diagnosis not present

## 2020-05-22 DIAGNOSIS — I5021 Acute systolic (congestive) heart failure: Secondary | ICD-10-CM | POA: Diagnosis not present

## 2020-05-26 DIAGNOSIS — J9601 Acute respiratory failure with hypoxia: Secondary | ICD-10-CM | POA: Diagnosis not present

## 2020-05-30 ENCOUNTER — Ambulatory Visit (INDEPENDENT_AMBULATORY_CARE_PROVIDER_SITE_OTHER): Payer: Medicare HMO | Admitting: Nurse Practitioner

## 2020-05-30 ENCOUNTER — Other Ambulatory Visit: Payer: Self-pay

## 2020-05-30 ENCOUNTER — Encounter: Payer: Self-pay | Admitting: Nurse Practitioner

## 2020-05-30 VITALS — BP 94/53 | HR 78 | Temp 97.3°F | Ht 64.0 in | Wt 210.1 lb

## 2020-05-30 DIAGNOSIS — J9602 Acute respiratory failure with hypercapnia: Secondary | ICD-10-CM | POA: Diagnosis not present

## 2020-05-30 DIAGNOSIS — Z09 Encounter for follow-up examination after completed treatment for conditions other than malignant neoplasm: Secondary | ICD-10-CM

## 2020-05-30 DIAGNOSIS — J9601 Acute respiratory failure with hypoxia: Secondary | ICD-10-CM | POA: Diagnosis not present

## 2020-05-30 DIAGNOSIS — Z9981 Dependence on supplemental oxygen: Secondary | ICD-10-CM | POA: Diagnosis not present

## 2020-05-30 DIAGNOSIS — I509 Heart failure, unspecified: Secondary | ICD-10-CM | POA: Diagnosis not present

## 2020-05-30 DIAGNOSIS — E860 Dehydration: Secondary | ICD-10-CM

## 2020-05-30 DIAGNOSIS — Z85118 Personal history of other malignant neoplasm of bronchus and lung: Secondary | ICD-10-CM | POA: Diagnosis not present

## 2020-05-30 DIAGNOSIS — D509 Iron deficiency anemia, unspecified: Secondary | ICD-10-CM

## 2020-05-30 DIAGNOSIS — I4891 Unspecified atrial fibrillation: Secondary | ICD-10-CM | POA: Diagnosis not present

## 2020-05-30 NOTE — Progress Notes (Signed)
New Patient Office Visit  Subjective:  Patient ID: [name], female    DOB: Nov 30, 1945  Age: 75 y.o. MRN: [id number]  CC:  Chief Complaint  Patient presents with  . Follow-up    HPI [name] presents for follow up after hospitalization. She was hospitalized on her return trip from [place] to [place]. The morning on 05/05/2020, she had developed shortness of breath and altered mentals status. Her husband took her to the ER in [place]. The patient was hypoxic and hypercapnic. She tested positive for influenza. Was started on BiPap, but had to be intubated. She was admitted to the ICU. CT chest indicated that she had left lower lobe pneumonia and potentially pneumonitis. She was also found to be in A-fib and amiodarone was started. She was in acute on chronic heart failure and had Type 2 MI. Her BNP was 123.2 then improved to 89 prior to discharge. An echocardiogram was performed on 05/06/2020 in the hospital.her ejection fractions was significantly reduced to 20-25%. There was moderate dilation of right ventricle with moderately reduced EF. The letft atrium was moderately dilated and the right atrium was mildly dilated. This is markedly changed from the most recent echo reviewed from 09/2019.  She does have a cardiologist here in [place]. She had a check up in his office prior to her trip and everything wa "fine." while hospitalized she became anemic. Her Hgb was 7.3. this improved throughout her stay to 7.6, then to 8.7 upon discharge. The patient was hospitalized from 05/05/2020 through 05/26/2020. She was discharged to home on augmentin twice daily for 6 days and prednisone taper. She was given prescriptions amiodarone, ferritin, protonix, and furosemide. She has completed therapy with antibiotics and prednisone. She is now on oxygen at 2 lpm. She states that she has not taken any of her blood pressure medication in the past three days. She states that her blood pressure is  running low and taking meds to lower the blood pressure is concerning. Blood pressure on low/normal level today. The patient continues to have fatigue and some weakness, though she is gradually improving. She does have a cardiologist, but has had a difficult time getting in touch with their office to make an appointment. She should also be seen per pulmonology. The patient has history of left upper lobe malignancy and had lobectomy of effected lung. She had done very well until the time of this recent illness. She is now on Brovana dally and using rescue nebs and inhalers as needed and as prescribed. It was also recommended that she consult with GI provider. Hemoglobin and hematocrit were moderately low during her hospital stay and she had a few episodes of dark, tarry colored stools while there. She states that since she has been home, stools have become more normal in consistency and no longer appear to have blood. Of note, in hospital, she was given lovenox injections to prevent thrombus formation. Will recheck CBC and BMP today.   Past Medical History:  Diagnosis Date  . CAD (coronary artery disease)   . Cancer ([place])   . Dyslipidemia   . GERD (gastroesophageal reflux disease)   . HTN (hypertension)   . Myocardial infarct ([place])   . Non-small cell lung cancer ([place]) 08/2010   Stage IA, status post left upper lobectomy July 2012  . Tobacco abuse     Past Surgical History:  Procedure Laterality Date  . CARDIAC CATHETERIZATION  07/06/2010   see CABG report - pt  sent to OR  . CARDIOVASCULAR STRESS TEST  09/11/2010   R/S MV - normal perfusion in all regions, EF 46%, no scintigraphic evidence of inducible myocardial ischemia; global LV systolic function mildly reduced; no significant wall motion abnormalities noted; Exercise capacity 7 METS; EKG negative for ischemia; low risk study, no signifcant change from previous study 08/2003  . CORONARY ARTERY BYPASS GRAFT  07/11/2010   LIMA to LAD; SVG to 2nd  branch OM; SVG to posterior descending artery  . LEFT VATS  09/17/2010  . TEE WITHOUT CARDIOVERSION  07/06/2010   during emergent CABG surgery; 2-3+ mitral regurgitation    Family History  Problem Relation Age of Onset  . Hypothyroidism Father   . Heart attack Father   . Aplastic anemia Maternal Grandfather   . Stroke Paternal Grandfather     Social History   Socioeconomic History  . Marital status: Married    Spouse name: Not on file  . Number of children: Not on file  . Years of education: Not on file  . Highest education level: Not on file  Occupational History  . Not on file  Tobacco Use  . Smoking status: Current Every Day Smoker    Packs/day: 0.50    Years: 45.00    Pack years: 22.50    Types: Cigarettes  . Smokeless tobacco: Never Used  . Tobacco comment: on and off since lobectomy  Vaping Use  . Vaping Use: Never used  Substance and Sexual Activity  . Alcohol use: No    Alcohol/week: 0.0 standard drinks  . Drug use: No  . Sexual activity: Never  Other Topics Concern  . Not on file  Social History Narrative  . Not on file   Social Determinants of Health   Financial Resource Strain: Not on file  Food Insecurity: Not on file  Transportation Needs: Not on file  Physical Activity: Not on file  Stress: Not on file  Social Connections: Not on file  Intimate Partner Violence: Not on file    ROS Review of Systems  Constitutional: Positive for activity change and fatigue. Negative for chills and fever.       The patient states that she is getting more fatigued easier with simple exertion.   HENT: Negative for congestion, postnasal drip, rhinorrhea, sinus pressure and sinus pain.   Eyes: Negative.   Respiratory: Positive for cough and shortness of breath. Negative for wheezing.        Patient now using nasal cannula oxygen at 2 liters per minute.   Cardiovascular: Positive for leg swelling. Negative for chest pain and palpitations.  Gastrointestinal:  Negative for constipation, diarrhea, nausea and vomiting.  Endocrine: Negative for cold intolerance, heat intolerance, polydipsia and polyuria.       History of hypothyroid.   Genitourinary: Negative.   Musculoskeletal: Positive for gait problem. Negative for back pain and myalgias.  Skin: Negative for rash.  Allergic/Immunologic: Negative.   Neurological: Positive for weakness. Negative for dizziness and headaches.  Hematological: Negative for adenopathy.  Psychiatric/Behavioral: The patient is nervous/anxious.   All other systems reviewed and are negative.   Objective:   Today's Vitals   05/30/20 1351  BP: (!) 94/53  Pulse: 78  Temp: (!) 97.3 F (36.3 C)  SpO2: 91%  Weight: 210 lb 1.6 oz (95.3 kg)  Height: 5\' 4"  (1.626 m)   Body mass index is 36.06 kg/m.   Physical Exam Vitals and nursing note reviewed.  Constitutional:      Appearance: Normal  appearance. She is well-developed. She is obese. She is ill-appearing.  HENT:     Head: Normocephalic and atraumatic.     Nose: Nose normal.     Mouth/Throat:     Mouth: Mucous membranes are moist.     Pharynx: Oropharynx is clear.  Eyes:     Extraocular Movements: Extraocular movements intact.     Conjunctiva/sclera: Conjunctivae normal.     Pupils: Pupils are equal, round, and reactive to light.  Cardiovascular:     Rate and Rhythm: Normal rate and regular rhythm.     Heart sounds: Normal heart sounds.     Comments: There is 1+ pitting edema in both lower legs.  Pulmonary:     Effort: Pulmonary effort is normal.     Breath sounds: Normal breath sounds.     Comments: Mildly decreased breath sounds in lower lobes of both lungs. She is using nasal cannula oxygen at 2 liters per minute.  Abdominal:     General: Bowel sounds are normal.     Palpations: Abdomen is soft.     Tenderness: There is no abdominal tenderness.  Musculoskeletal:        General: Normal range of motion.     Cervical back: Normal range of motion and  neck supple.  Skin:    General: Skin is warm and dry.     Capillary Refill: Capillary refill takes less than 2 seconds.  Neurological:     General: No focal deficit present.     Mental Status: She is alert and oriented to person, place, and time.  Psychiatric:        Attention and Perception: Attention normal.        Mood and Affect: Affect normal. Mood is anxious.        Speech: Speech normal.        Behavior: Behavior normal. Behavior is cooperative.        Thought Content: Thought content normal.        Cognition and Memory: Cognition normal.        Judgment: Judgment normal.     Assessment & Plan:  1. Hospital discharge follow-up The patient was hospitalized in [place] from 05/05/2020 through 05/26/2020. Reviewed progress notes, imaging and lab studies, and recommendations at discharge with the patient and her husband.   2. Acute respiratory failure with hypoxia and hypercapnia (HCC) Patient had left lower lobe pneumonia with the flu, resulting in her hospitalization. She is now on nasal cannula oxygen at all times. She continues to have shortness of breath with mild exertion. Will refer to pulmonology for further evaluation and treatment.  - Ambulatory referral to Pulmonology  3. Supplemental oxygen dependent Continue to use oxygen per nasal cannula as prescribed. Refer to pulmonology for further evaluation and treatment.  - Ambulatory referral to Pulmonology  4. History of lung cancer Had left upper lobectomy and recovered well.   5. Acute on chronic congestive heart failure, unspecified heart failure type ([place]) Reviewed echocardiogram done 05/06/2020, which showed marked decline in most recent echocardiogram done 09/2019. Echo from hospital showing ejection fraction of 20-25%. There is moderate dilation of the right ventricle with reduced EF. There is moderate enlargement of left atrium and mild dilation of right atrium. Blood pressure is running low and patient has not taken  blood pressure medication because of this. Will send urgent request for appointment to her cardiologist for further evaluation and treatment.  - Ambulatory referral to Cardiology  6. Atrial fibrillation, unspecified type ([place])  Discharged with lower dose Metoprolol ER at 25mg  daily. Was stared on amiodarone and furosemide daily. Patient has taken fluid pill, but not antiarrhythmic due to low blood pressure. Urgent request for appointment sent to her cardiologist.  - Ambulatory referral to Cardiology  7. Iron deficiency anemia, unspecified iron deficiency anemia type Check CBC today. Continue iron supplement as prescribed. Will need referral to GI for updated colonoscopy.  - CBC with Differential/Platelet  8. Dehydration Improving prior to leaving hospital. Check BMP for surveillance.  - Basic Metabolic Panel (BMET)  Problem List Items Addressed This Visit      Cardiovascular and Mediastinum   Acute on chronic congestive heart failure ([place])   Relevant Orders   Ambulatory referral to Cardiology   Atrial fibrillation [facility])   Relevant Orders   Ambulatory referral to Cardiology     Respiratory   Acute respiratory failure with hypoxia and hypercapnia ([place])   Relevant Orders   Ambulatory referral to [facility] discharge follow-up - Primary   Supplemental oxygen dependent   Relevant Orders   Ambulatory referral to Pulmonology   History of lung cancer   Iron deficiency anemia   Relevant Orders   CBC with Differential/Platelet   Dehydration   Relevant Orders   Basic Metabolic Panel (BMET) (Completed)      Outpatient Encounter Medications as of 05/30/2020  Medication Sig  . amLODipine (NORVASC) 5 MG tablet TAKE 1 TABLET BY MOUTH EVERY DAY  . aspirin 81 MG tablet Take 81 mg by mouth daily.  . Coenzyme Q10 300 MG CAPS Take by mouth.  . hydrochlorothiazide (MICROZIDE) 12.5 MG capsule Take 1 capsule (12.5 mg total) by mouth as needed.  Marland Kitchen levothyroxine  (SYNTHROID) 25 MCG tablet TAKE 0.5 TABLETS (12.5 MCG TOTAL) BY MOUTH DAILY. IN ADDITION TO THE 50 MCG.  . levothyroxine (SYNTHROID) 50 MCG tablet TAKE 1 TABLET BY MOUTH EVERY DAY BEFORE BREAKFAST  . metoprolol succinate (TOPROL-XL) 100 MG 24 hr tablet Take 1 tablet (100 mg total) by mouth daily. Take with or immediately following a meal.  . Omega-3 Fatty Acids (FISH OIL) 1000 MG CAPS Take 2,000 mg by mouth daily.  . rosuvastatin (CRESTOR) 40 MG tablet TAKE 1 TABLET BY MOUTH EVERY DAY  . Vitamin D, Ergocalciferol, (DRISDOL) 1.25 MG (50000 UT) CAPS capsule Take one tablet wkly (Patient not taking: Reported on 03/27/2020)   No facility-administered encounter medications on file as of 05/30/2020.   Time spent with the patient was approximately 60 minutes. This time included reviewing progress notes, labs, imaging studies, and discussing plan for follow up.   Follow-up: Return in about 3 weeks (around 06/20/2020) for resp/heart failure - patient needs to see her cardiologist and pulm ASAP. thanks. Marland Kitchen   [name], NP

## 2020-05-30 NOTE — Patient Instructions (Signed)
Home Oxygen Use, Adult When a medical condition keeps you from getting enough oxygen, your health care provider may instruct you to take extra oxygen at home. Your health care provider will let you know:  When to take oxygen.  How long to take oxygen.  How quickly oxygen should be delivered (flow rate), in liters per minute (LPM or L/M). Home oxygen can be given through:  A mask.  A nasal cannula. This is a device or tube that goes in the nostrils.  A transtracheal catheter. This is a small, thin tube placed in the windpipe (trachea).  A breathing tube (tracheostomy tube) that is surgically placed in the windpipe. This may be used in severe cases. These devices are connected with tubing to an oxygen source, such as:  A tank. Tanks hold oxygen in gas form. They must be replaced when the oxygen is used up.  A liquid oxygen device. This holds oxygen in liquid form. Liquid oxygen is very cold. It must be replaced when the oxygen is used up.  An oxygen concentrator machine. This filters oxygen in the room. There are two types of oxygen concentrator machines--stationary and portable. ? A stationary oxygen concentrator machine plugs into the main electricity supply at your home. You must have a backup cylinder of oxygen in case the power goes out. ? A portable oxygen concentrator machine is smaller in size and more lightweight. This machine uses battery supply and can be used outside the home. Work with your health care provider to find equipment that works best for you and your lifestyle. What are the risks? Delivery of supplemental oxygen is generally safe. However, some risks include:  Fire. This can happen if the oxygen is exposed to a heat source, flame, or spark.  Injury to skin. This can happen if liquid oxygen touches your skin.  Damage to the lungs or other organs. This can happen from getting too little or too much oxygen. Supplies needed: To use oxygen, you will need:  A  mask, nasal cannula, transtracheal catheter, or tracheostomy.  An oxygen tank, a liquid oxygen device, or an oxygen concentrator.  The tape that your health care provider recommends (optional). Your health care provider may also recommend:  A humidifier to warm and moisten the oxygen delivered. This will depend on how much oxygen you need and the type of home oxygen device you use.  A pulse oximeter. This device measures the percentage of oxygen in your blood. How to use oxygen Your health care provider or a person from your [place] will show you how to use your oxygen device. Follow his or her instructions. The instructions may look something like this: 1. Wash your hands with soap and water. 2. If you use an oxygen concentrator, make sure it is plugged in. 3. Place one end of the tube into the port on the tank, device, or machine. 4. Place the mask over your nose and mouth. Or, place the nasal cannula and secure it with tape if instructed. If you use a tracheostomy or transtracheal catheter, connect it to the oxygen source as directed. 5. Make sure the liter-flow setting on the machine is at the level prescribed by your health care provider. 6. Turn on the machine or adjust the knob on the tank or device to the correct liter-flow setting. 7. When you are done, turn off and unplug the machine, or turn the knob to OFF.   How to clean and care for the oxygen supplies  Nasal cannula  Clean it with a warm, wet cloth daily or as needed.  Wash it with a liquid soap once a week.  Rinse it thoroughly once or twice a week.  Air-dry it.  Replace it every 2-4 weeks.  If you have an infection, such as a cold or pneumonia, change the cannula when you get better. Mask  Replace it every 2-4 weeks.  If you have an infection, such as a cold or pneumonia, change the mask when you get better. Humidifier bottle  Wash the bottle between each refill: ? Wash it with soap and warm  water. ? Rinse it thoroughly. ? Clean it and its top with a disinfectant cleaner. ? Air-dry it. ? Make sure it is dry before you refill it. Oxygen concentrator  Clean the air filter at least twice a week according to directions from your home medical equipment and service company.  Wipe down the cabinet every day. To do this: ? Unplug the unit. ? Wipe down the cabinet with a damp cloth. ? Dry the cabinet. Other equipment  Change any extra tubing every 1-3 months.  Follow instructions from your health care provider about taking care of any other equipment. Safety tips Fire safety tips  Keep your oxygen and oxygen supplies at least 6 ft (2 m) away from sources of heat, flames, and sparks at all times.  Do not allow smoking near your oxygen. Put up "no smoking" signs in your home. Avoid smoking areas when in public.  Do not use materials that can burn (are flammable) while you use oxygen. This includes: ? Petroleum jelly. ? Hair spray or other aerosol sprays. ? Rubbing alcohol. ? Hand sanitizer.  When you go to a restaurant with portable oxygen, ask to be seated in the non-smoking section.  Keep a Data processing manager close by. Let your fire department know that you have oxygen in your home.  Test your home smoke detectors regularly.   Traveling  Secure your oxygen tank in the vehicle so that it does not move around. Follow instructions from your medical device company about how to safely secure your tank.  Make sure you have enough oxygen for the amount of time you will be away from home.  If you are planning to travel by public transportation (airplane, train, bus, or boat), contact the company to find out if it allows the use of an approved portable oxygen concentrator. You may also need documents from your health care provider and medical device company before you travel. General safety tips  If you use an oxygen cylinder, make sure it is in a stand or secured to an object  that will not move (fixed object).  If you use liquid oxygen, make sure its container is kept upright at all times.  If you use an oxygen concentrator: ? Dance movement psychotherapist company. Make sure you are given priority service in the event that your power goes out. ? Avoid using extension cords if possible. Follow these instructions at home:  Use oxygen only as told by your health care provider.  Do not use alcohol or other drugs that make you relax (sedating drugs) unless instructed. They can slow down your breathing rate and make it hard to get in enough oxygen.  Know how and when to order a refill of oxygen.  Always keep a spare tank of oxygen. Plan ahead for holidays when you may not be able to get a prescription filled.  Use water-based lubricants on your  lips or nostrils. Do not use oil-based products like petroleum jelly.  To prevent skin irritation on your cheeks or behind your ears, tuck some gauze under the tubing. Where to find more information  American Lung Association: DiabeticMale.de Contact a health care provider if:  You get headaches often.  You have a lasting cough.  You are restless or have anxiety.  You develop an illness that affects your breathing.  You cannot exercise at your regular level.  You have a fever.  You have persistent redness under your nose. Get help right away if:  You are confused.  You are sleepy all the time.  You have blue lips or fingernails.  You have difficult or irregular breathing that is getting worse.  You are struggling to breathe. These symptoms may represent a serious problem that is an emergency. Do not wait to see if the symptoms will go away. Get medical help right away. Call your local emergency services (911 in the [place] Do not drive yourself to the hospital. Summary  Your health care provider or a person from your [place] will show you how to use your oxygen device. Follow his or her  instructions.  If you use an oxygen concentrator, make sure it is plugged in.  Make sure the liter-flow setting on the machine is at the level prescribed by your health care provider.  Use oxygen only as told by your health care provider.  Keep your oxygen and oxygen supplies at least 6 ft (2 m) away from sources of heat, flames, and sparks at all times. This information is not intended to replace advice given to you by your health care provider. Make sure you discuss any questions you have with your health care provider. Document Revised: 04/08/2019 Document Reviewed: 02/02/2019 Elsevier Patient Education  2021 [place].  Atrial Fibrillation  Atrial fibrillation is a type of heartbeat that is irregular or fast. If you have this condition, your heart beats without any order. This makes it hard for your heart to pump blood in a normal way. Atrial fibrillation may come and go, or it may become a long-lasting problem. If this condition is not treated, it can put you at higher risk for stroke, heart failure, and other heart problems. What are the causes? This condition may be caused by diseases that damage the heart. They include:  High blood pressure.  Heart failure.  Heart valve disease.  Heart surgery. Other causes include:  Diabetes.  Thyroid disease.  Being overweight.  Kidney disease. Sometimes the cause is not known. What increases the risk? You are more likely to develop this condition if:  You are older.  You smoke.  You exercise often and very hard.  You have a family history of this condition.  You are a man.  You use drugs.  You drink a lot of alcohol.  You have lung conditions, such as emphysema, pneumonia, or COPD.  You have sleep apnea. What are the signs or symptoms? Common symptoms of this condition include:  A feeling that your heart is beating very fast.  Chest pain or discomfort.  Feeling short of breath.  Suddenly feeling  light-headed or weak.  Getting tired easily during activity.  Fainting.  Sweating. In some cases, there are no symptoms. How is this treated? Treatment for this condition depends on underlying conditions and how you feel when you have atrial fibrillation. They include:  Medicines to: ? Prevent blood clots. ? Treat heart rate or heart  rhythm problems.  Using devices, such as a pacemaker, to correct heart rhythm problems.  Doing surgery to remove the part of the heart that sends bad signals.  Closing an area where clots can form in the heart (left atrial appendage). In some cases, your doctor will treat other underlying conditions. Follow these instructions at home: Medicines  Take over-the-counter and prescription medicines only as told by your doctor.  Do not take any new medicines without first talking to your doctor.  If you are taking blood thinners: ? Talk with your doctor before you take any medicines that have aspirin or NSAIDs, such as ibuprofen, in them. ? Take your medicine exactly as told by your doctor. Take it at the same time each day. ? Avoid activities that could hurt or bruise you. Follow instructions about how to prevent falls. ? Wear a bracelet that says you are taking blood thinners. Or, carry a card that lists what medicines you take. Lifestyle  Do not use any products that have nicotine or tobacco in them. These include cigarettes, e-cigarettes, and chewing tobacco. If you need help quitting, ask your doctor.  Eat heart-healthy foods. Talk with your doctor about the right eating plan for you.  Exercise regularly as told by your doctor.  Do not drink alcohol.  Lose weight if you are overweight.  Do not use drugs, including cannabis.      General instructions  If you have a condition that causes breathing to stop for a short period of time (apnea), treat it as told by your doctor.  Keep a healthy weight. Do not use diet pills unless your doctor  says they are safe for you. Diet pills may make heart problems worse.  Keep all follow-up visits as told by your doctor. This is important. Contact a doctor if:  You notice a change in the speed, rhythm, or strength of your heartbeat.  You are taking a blood-thinning medicine and you get more bruising.  You get tired more easily when you move or exercise.  You have a sudden change in weight. Get help right away if:  You have pain in your chest or your belly (abdomen).  You have trouble breathing.  You have side effects of blood thinners, such as blood in your vomit, poop (stool), or pee (urine), or bleeding that cannot stop.  You have any signs of a stroke. "BE FAST" is an easy way to remember the main warning signs: ? B - Balance. Signs are dizziness, sudden trouble walking, or loss of balance. ? E - Eyes. Signs are trouble seeing or a change in how you see. ? F - Face. Signs are sudden weakness or loss of feeling in the face, or the face or eyelid drooping on one side. ? A - Arms. Signs are weakness or loss of feeling in an arm. This happens suddenly and usually on one side of the body. ? S - Speech. Signs are sudden trouble speaking, slurred speech, or trouble understanding what people say. ? T - Time. Time to call emergency services. Write down what time symptoms started.  You have other signs of a stroke, such as: ? A sudden, very bad headache with no known cause. ? Feeling like you may vomit (nausea). ? Vomiting. ? A seizure. These symptoms may be an emergency. Do not wait to see if the symptoms will go away. Get medical help right away. Call your local emergency services (911 in the [place] Do not drive yourself to  the hospital.   Summary  Atrial fibrillation is a type of heartbeat that is irregular or fast.  You are at higher risk of this condition if you smoke, are older, have diabetes, or are overweight.  Follow your doctor's instructions about medicines, diet,  exercise, and follow-up visits.  Get help right away if you have signs or symptoms of a stroke.  Get help right away if you cannot catch your breath, or you have chest pain or discomfort. This information is not intended to replace advice given to you by your health care provider. Make sure you discuss any questions you have with your health care provider. Document Revised: 07/29/2018 Document Reviewed: 07/29/2018 Elsevier Patient Education  [place].

## 2020-05-31 ENCOUNTER — Other Ambulatory Visit: Payer: Self-pay | Admitting: Nurse Practitioner

## 2020-05-31 DIAGNOSIS — D509 Iron deficiency anemia, unspecified: Secondary | ICD-10-CM | POA: Insufficient documentation

## 2020-05-31 DIAGNOSIS — Z9981 Dependence on supplemental oxygen: Secondary | ICD-10-CM | POA: Insufficient documentation

## 2020-05-31 DIAGNOSIS — I509 Heart failure, unspecified: Secondary | ICD-10-CM | POA: Insufficient documentation

## 2020-05-31 DIAGNOSIS — Z1231 Encounter for screening mammogram for malignant neoplasm of breast: Secondary | ICD-10-CM

## 2020-05-31 DIAGNOSIS — Z85118 Personal history of other malignant neoplasm of bronchus and lung: Secondary | ICD-10-CM | POA: Insufficient documentation

## 2020-05-31 DIAGNOSIS — J9602 Acute respiratory failure with hypercapnia: Secondary | ICD-10-CM | POA: Insufficient documentation

## 2020-05-31 DIAGNOSIS — E860 Dehydration: Secondary | ICD-10-CM | POA: Insufficient documentation

## 2020-05-31 DIAGNOSIS — I480 Paroxysmal atrial fibrillation: Secondary | ICD-10-CM | POA: Insufficient documentation

## 2020-05-31 DIAGNOSIS — Z09 Encounter for follow-up examination after completed treatment for conditions other than malignant neoplasm: Secondary | ICD-10-CM | POA: Insufficient documentation

## 2020-05-31 DIAGNOSIS — J9601 Acute respiratory failure with hypoxia: Secondary | ICD-10-CM | POA: Insufficient documentation

## 2020-05-31 DIAGNOSIS — J440 Chronic obstructive pulmonary disease with acute lower respiratory infection: Secondary | ICD-10-CM

## 2020-05-31 DIAGNOSIS — I4891 Unspecified atrial fibrillation: Secondary | ICD-10-CM | POA: Insufficient documentation

## 2020-05-31 LAB — BASIC METABOLIC PANEL
BUN/Creatinine Ratio: 17 (ref 12–28)
BUN: 14 mg/dL (ref 8–27)
CO2: 32 mmol/L — ABNORMAL HIGH (ref 20–29)
Calcium: 8.9 mg/dL (ref 8.7–10.3)
Chloride: 95 mmol/L — ABNORMAL LOW (ref 96–106)
Creatinine, Ser: 0.83 mg/dL (ref 0.57–1.00)
Glucose: 150 mg/dL — ABNORMAL HIGH (ref 65–99)
Potassium: 3.6 mmol/L (ref 3.5–5.2)
Sodium: 143 mmol/L (ref 134–144)
eGFR: 74 mL/min/{1.73_m2} (ref >59)

## 2020-05-31 LAB — CBC WITH DIFFERENTIAL/PLATELET
Basophils Absolute: 0.1 10*3/uL (ref 0.0–0.2)
Basos: 0 %
EOS (ABSOLUTE): 0.1 10*3/uL (ref 0.0–0.4)
Eos: 0 %
Hematocrit: 31.2 % — ABNORMAL LOW (ref 34.0–46.6)
Hemoglobin: 9.6 g/dL — ABNORMAL LOW (ref 11.1–15.9)
Immature Grans (Abs): 0.2 10*3/uL — ABNORMAL HIGH (ref 0.0–0.1)
Immature Granulocytes: 1 %
Lymphocytes Absolute: 2.8 10*3/uL (ref 0.7–3.1)
Lymphs: 18 %
MCH: 29.4 pg (ref 26.6–33.0)
MCHC: 30.8 g/dL — ABNORMAL LOW (ref 31.5–35.7)
MCV: 95 fL (ref 79–97)
Monocytes Absolute: 1.3 10*3/uL — ABNORMAL HIGH (ref 0.1–0.9)
Monocytes: 8 %
NRBC: 1 % — ABNORMAL HIGH (ref 0–0)
Neutrophils Absolute: 11 10*3/uL — ABNORMAL HIGH (ref 1.4–7.0)
Neutrophils: 73 %
Platelets: 246 10*3/uL (ref 150–450)
RBC: 3.27 x10E6/uL — ABNORMAL LOW (ref 3.77–5.28)
RDW: 18.1 % — ABNORMAL HIGH (ref 11.7–15.4)
WBC: 15.4 10*3/uL — ABNORMAL HIGH (ref 3.4–10.8)

## 2020-05-31 LAB — SPECIMEN STATUS REPORT

## 2020-05-31 MED ORDER — DOXYCYCLINE HYCLATE 100 MG PO TABS: 100.0000 mg | ORAL_TABLET | Freq: Two times a day (BID) | ORAL | 0 refills | Status: DC

## 2020-05-31 NOTE — Progress Notes (Signed)
Labs showing high white blood cells which indicates infection. Start doxycycline 100mg  twice daily. This will be for next 10 days. I have also added order for her to get chest x-ray. This is at [facility] on [address].

## 2020-05-31 NOTE — Progress Notes (Signed)
Hey. Please let the patient know that labs showing high white blood cells which indicates infection. Start doxycycline 100mg  twice daily. Prescription sent to [facility] [address]. This will be for next 10 days. I have also added order for her to get chest x-ray. This is at [facility] on [address]. Blood count is improving. She should continue ferritin and we will recheck cbc next visit. Thanks.

## 2020-06-02 ENCOUNTER — Ambulatory Visit
Admission: RE | Admit: 2020-06-02 | Discharge: 2020-06-02 | Disposition: A | Discharge status: Home / Self Care | Payer: Medicare HMO | Source: Ambulatory Visit | Attending: Nurse Practitioner | Admitting: Nurse Practitioner

## 2020-06-02 DIAGNOSIS — R059 Cough, unspecified: Secondary | ICD-10-CM | POA: Diagnosis not present

## 2020-06-02 DIAGNOSIS — J440 Chronic obstructive pulmonary disease with acute lower respiratory infection: Secondary | ICD-10-CM

## 2020-06-04 ENCOUNTER — Encounter: Payer: Self-pay | Admitting: Nurse Practitioner

## 2020-06-04 NOTE — Progress Notes (Signed)
Chronic but stable changes associated with surgery and chronic lung disease. No evidence of pneumonia or other acute disease.

## 2020-06-20 ENCOUNTER — Ambulatory Visit (INDEPENDENT_AMBULATORY_CARE_PROVIDER_SITE_OTHER): Payer: Medicare HMO | Admitting: Physician Assistant

## 2020-06-20 ENCOUNTER — Other Ambulatory Visit: Payer: Self-pay

## 2020-06-20 ENCOUNTER — Encounter: Payer: Self-pay | Admitting: Physician Assistant

## 2020-06-20 VITALS — BP 103/65 | HR 84 | Temp 98.0°F | Ht 64.0 in | Wt 208.3 lb

## 2020-06-20 DIAGNOSIS — I4891 Unspecified atrial fibrillation: Secondary | ICD-10-CM

## 2020-06-20 DIAGNOSIS — D72829 Elevated white blood cell count, unspecified: Secondary | ICD-10-CM | POA: Diagnosis not present

## 2020-06-20 DIAGNOSIS — I10 Essential (primary) hypertension: Secondary | ICD-10-CM

## 2020-06-20 MED ORDER — METOPROLOL SUCCINATE ER 25 MG PO TB24: 25.0000 mg | ORAL_TABLET | Freq: Every day | ORAL | 3 refills | Status: DC

## 2020-06-20 NOTE — Patient Instructions (Signed)

## 2020-06-20 NOTE — Progress Notes (Signed)
Established Patient Office Visit  Subjective:  Patient ID: [name], female    DOB: 24-Mar-1945  Age: 75 y.o. MRN: [id number]  CC:  Chief Complaint  Patient presents with  . Follow-up    HPI [name] presents for follow up. Patient was seen 05/30/2020 for hospital follow up by colleague. Patient was admitted in [place] for pneumonia, UTI, acute on chronic heart failure, and influenza. Patient was started on amiodarone 200 mg due to atrial fibrillation which reports has started taking again. States thought metoprolol was only for blood pressure so was not taking until she started "hearing" her "heartbeat" which she has not noticed since starting medication again. States checking BP at home which has been <120/80. Denies chest pain, palpitations, dizziness or worsening shortness of breath. Lower extremity has been stable, taking water pill (HCTZ).  Has been wearing her oxygen on and off while at home, oxygen saturations range from low 80s to uppper 80s. Patient is on 2 liters of O2.  States has been taking some mucinex for chest congestion which has helped with symptoms. Denies fever, cough, wheezing or orthopnea. Patient was started on doxycycline due to elevated WBC which patient has completed. Patient has not received notification from pulmonology or cardiology referrals placed last visit.  Past Medical History:  Diagnosis Date  . CAD (coronary artery disease)   . Cancer ([place])   . Dyslipidemia   . GERD (gastroesophageal reflux disease)   . HTN (hypertension)   . Myocardial infarct ([place])   . Non-small cell lung cancer ([place]) 08/2010   Stage IA, status post left upper lobectomy July 2012  . Tobacco abuse     Past Surgical History:  Procedure Laterality Date  . CARDIAC CATHETERIZATION  07/06/2010   see CABG report - pt sent to OR  . CARDIOVASCULAR STRESS TEST  09/11/2010   R/S MV - normal perfusion in all regions, EF 46%, no scintigraphic evidence of inducible  myocardial ischemia; global LV systolic function mildly reduced; no significant wall motion abnormalities noted; Exercise capacity 7 METS; EKG negative for ischemia; low risk study, no signifcant change from previous study 08/2003  . CORONARY ARTERY BYPASS GRAFT  07/11/2010   LIMA to LAD; SVG to 2nd branch OM; SVG to posterior descending artery  . LEFT VATS  09/17/2010  . TEE WITHOUT CARDIOVERSION  07/06/2010   during emergent CABG surgery; 2-3+ mitral regurgitation    Family History  Problem Relation Age of Onset  . Hypothyroidism Father   . Heart attack Father   . Aplastic anemia Maternal Grandfather   . Stroke Paternal Grandfather     Social History   Socioeconomic History  . Marital status: Married    Spouse name: Not on file  . Number of children: Not on file  . Years of education: Not on file  . Highest education level: Not on file  Occupational History  . Not on file  Tobacco Use  . Smoking status: Current Every Day Smoker    Packs/day: 0.50    Years: 45.00    Pack years: 22.50    Types: Cigarettes  . Smokeless tobacco: Never Used  . Tobacco comment: on and off since lobectomy  Vaping Use  . Vaping Use: Never used  Substance and Sexual Activity  . Alcohol use: No    Alcohol/week: 0.0 standard drinks  . Drug use: No  . Sexual activity: Never  Other Topics Concern  . Not on file  Social History Narrative  .  Not on file   Social Determinants of Health   Financial Resource Strain: Not on file  Food Insecurity: Not on file  Transportation Needs: Not on file  Physical Activity: Not on file  Stress: Not on file  Social Connections: Not on file  Intimate Partner Violence: Not on file    Outpatient Medications Prior to Visit  Medication Sig Dispense Refill  . albuterol (VENTOLIN HFA) 108 (90 Base) MCG/ACT inhaler Inhale into the lungs.    Marland Kitchen amiodarone (PACERONE) 200 MG tablet Take by mouth.    Marland Kitchen aspirin 81 MG tablet Take 81 mg by mouth daily.    . Coenzyme  Q10 300 MG CAPS Take by mouth.    . doxycycline (VIBRA-TABS) 100 MG tablet Take 1 tablet (100 mg total) by mouth 2 (two) times daily. 20 tablet 0  . furosemide (LASIX) 40 MG tablet Take 40 mg by mouth 2 (two) times daily.    . hydrochlorothiazide (MICROZIDE) 12.5 MG capsule Take 1 capsule (12.5 mg total) by mouth as needed. 30 capsule 3  . levothyroxine (SYNTHROID) 25 MCG tablet TAKE 0.5 TABLETS (12.5 MCG TOTAL) BY MOUTH DAILY. IN ADDITION TO THE 50 MCG. 45 tablet 1  . levothyroxine (SYNTHROID) 50 MCG tablet TAKE 1 TABLET BY MOUTH EVERY DAY BEFORE BREAKFAST 90 tablet 0  . metoprolol succinate (TOPROL-XL) 100 MG 24 hr tablet Take 1 tablet (100 mg total) by mouth daily. Take with or immediately following a meal. (Patient taking differently: Take 25 mg by mouth daily. Take with or immediately following a meal.) 90 tablet 3  . Omega-3 Fatty Acids (FISH OIL) 1000 MG CAPS Take 2,000 mg by mouth daily.    . pantoprazole (PROTONIX) 40 MG tablet Take 1 tablet by mouth 2 (two) times daily.    . rosuvastatin (CRESTOR) 40 MG tablet TAKE 1 TABLET BY MOUTH EVERY DAY 90 tablet 1  . amLODipine (NORVASC) 5 MG tablet TAKE 1 TABLET BY MOUTH EVERY DAY (Patient not taking: Reported on 06/20/2020) 90 tablet 2   No facility-administered medications prior to visit.    No Known Allergies  ROS Review of Systems Review of Systems:  A fourteen system review of systems was performed and found to be positive as per HPI.   Objective:    Physical Exam General:  Well Developed, well nourished, in no acute distress  Neuro:  Alert and oriented,  extra-ocular muscles intact  HEENT:  Normocephalic, atraumatic, neck supple Skin:  no gross rash, warm, pink. Cardiac:  RRR, S1 S2 wnl's  Respiratory:  ECTA B/L w/o wheezing, crackles, rales with mild dec breath sounds, Not using accessory muscles, speaking in full sentences- unlabored. Vascular:  Ext warm, no cyanosis apprec.; cap RF less 2 sec. Psych:  No HI/SI, judgement and  insight good, Euthymic mood. Full Affect.   BP 103/65   Pulse 84   Temp 98 F (36.7 C)   Ht '5\' 4"'  (1.626 m)   Wt 208 lb 4.8 oz (94.5 kg)   BMI 35.75 kg/m  Wt Readings from Last 3 Encounters:  06/20/20 208 lb 4.8 oz (94.5 kg)  05/30/20 210 lb 1.6 oz (95.3 kg)  04/06/20 217 lb (98.4 kg)     Health Maintenance Due  Topic Date Due  . Hepatitis C Screening  Never done  . COVID-19 Vaccine (1) Never done  . PNA vac Low Risk Adult (2 of 2 - PCV13) 01/28/2020    There are no preventive care reminders to display for this patient.  Lab Results  Component Value Date   TSH 4.400 03/21/2020   Lab Results  Component Value Date   WBC 15.4 (H) 05/30/2020   HGB 9.6 (L) 05/30/2020   HCT 31.2 (L) 05/30/2020   MCV 95 05/30/2020   PLT 246 05/30/2020   Lab Results  Component Value Date   NA 143 05/30/2020   K 3.6 05/30/2020   CO2 32 (H) 05/30/2020   GLUCOSE 150 (H) 05/30/2020   BUN 14 05/30/2020   CREATININE 0.83 05/30/2020   BILITOT 0.3 03/21/2020   ALKPHOS 72 03/21/2020   AST 21 03/21/2020   ALT 30 03/21/2020   PROT 7.1 03/21/2020   ALBUMIN 4.2 03/21/2020   CALCIUM 8.9 05/30/2020   EGFR 74 05/30/2020   Lab Results  Component Value Date   CHOL 123 03/21/2020   Lab Results  Component Value Date   HDL 46 03/21/2020   Lab Results  Component Value Date   LDLCALC 56 03/21/2020   Lab Results  Component Value Date   TRIG 119 03/21/2020   Lab Results  Component Value Date   CHOLHDL 2.7 03/21/2020   Lab Results  Component Value Date   HGBA1C 6.1 (H) 06/14/2019      Assessment & Plan:   Problem List Items Addressed This Visit      Cardiovascular and Mediastinum   HTN (hypertension) (Chronic)   Relevant Medications   amiodarone (PACERONE) 200 MG tablet   furosemide (LASIX) 40 MG tablet   Atrial fibrillation (HCC)   Relevant Medications   amiodarone (PACERONE) 200 MG tablet   furosemide (LASIX) 40 MG tablet    Other Visit Diagnoses    Leukocytosis,  unspecified type    -  Primary   Relevant Orders   CBC w/Diff     Hypertension: -Reviewed hospital records and hospital discharge visit.  -Patient continues to have soft blood pressure but stable. -Recommend to continue with metoprolol succinate 25 mg daily and HCTZ 12.5 mg as needed for edema. -Continue ambulatory BP/pulse monitoring. -Last BMP renal function and electrolytes wnl's.  Atrial fibrillation: -Patient on amiodarone 200 mg and metoprolol succinate 25 mg.  -Will check status of cardiology referral. Patient is on aspirin and recommend cardiology evaluation for appropriate anticoagulant.    Leukocytosis: -Patient has completed antibiotic therapy. -Will repeat CBC today.   No orders of the defined types were placed in this encounter.   Follow-up: Return in about 4 weeks (around 07/18/2020) for HTN, a fib .    Lorrene Reid, PA-C

## 2020-06-21 ENCOUNTER — Telehealth: Payer: Self-pay | Admitting: Physician Assistant

## 2020-06-21 LAB — CBC WITH DIFFERENTIAL/PLATELET
Basophils Absolute: 0.2 10*3/uL (ref 0.0–0.2)
Basos: 2 %
EOS (ABSOLUTE): 0.1 10*3/uL (ref 0.0–0.4)
Eos: 1 %
Hematocrit: 41.5 % (ref 34.0–46.6)
Hemoglobin: 13 g/dL (ref 11.1–15.9)
Immature Grans (Abs): 0.2 10*3/uL — ABNORMAL HIGH (ref 0.0–0.1)
Immature Granulocytes: 2 %
Lymphocytes Absolute: 3.1 10*3/uL (ref 0.7–3.1)
Lymphs: 33 %
MCH: 28.1 pg (ref 26.6–33.0)
MCHC: 31.3 g/dL — ABNORMAL LOW (ref 31.5–35.7)
MCV: 90 fL (ref 79–97)
Monocytes Absolute: 1 10*3/uL — ABNORMAL HIGH (ref 0.1–0.9)
Monocytes: 11 %
Neutrophils Absolute: 4.9 10*3/uL (ref 1.4–7.0)
Neutrophils: 51 %
Platelets: 431 10*3/uL (ref 150–450)
RBC: 4.63 x10E6/uL (ref 3.77–5.28)
RDW: 16.8 % — ABNORMAL HIGH (ref 11.7–15.4)
WBC: 9.5 10*3/uL (ref 3.4–10.8)

## 2020-06-21 NOTE — Telephone Encounter (Signed)
Contacted Dr. Ermalinda Memos office regarding referral that was placed by [facility] in April. Spoke with [place] who states patient is already established in his office and can contact to move apt to sooner time if needed. Pt currently scheduled for 07/27/20.   Advised patient of this and she verbalized understanding. AS, CMA

## 2020-06-22 NOTE — Telephone Encounter (Signed)
Awesome! Thank you!

## 2020-06-23 ENCOUNTER — Other Ambulatory Visit: Payer: Self-pay

## 2020-06-23 MED ORDER — LEVOTHYROXINE SODIUM 50 MCG PO TABS: ORAL_TABLET | ORAL | 0 refills | Status: DC

## 2020-06-25 DIAGNOSIS — J9601 Acute respiratory failure with hypoxia: Secondary | ICD-10-CM | POA: Diagnosis not present

## 2020-06-28 ENCOUNTER — Encounter: Payer: Self-pay | Admitting: Physician Assistant

## 2020-06-28 DIAGNOSIS — R600 Localized edema: Secondary | ICD-10-CM

## 2020-06-29 ENCOUNTER — Ambulatory Visit: Payer: Medicare HMO | Admitting: Internal Medicine

## 2020-06-29 ENCOUNTER — Encounter: Payer: Self-pay | Admitting: Internal Medicine

## 2020-06-29 ENCOUNTER — Ambulatory Visit (INDEPENDENT_AMBULATORY_CARE_PROVIDER_SITE_OTHER): Payer: Medicare HMO

## 2020-06-29 ENCOUNTER — Other Ambulatory Visit: Payer: Self-pay

## 2020-06-29 DIAGNOSIS — J9611 Chronic respiratory failure with hypoxia: Secondary | ICD-10-CM

## 2020-06-29 DIAGNOSIS — R0600 Dyspnea, unspecified: Secondary | ICD-10-CM

## 2020-06-29 DIAGNOSIS — J439 Emphysema, unspecified: Secondary | ICD-10-CM | POA: Diagnosis not present

## 2020-06-29 DIAGNOSIS — J9612 Chronic respiratory failure with hypercapnia: Secondary | ICD-10-CM | POA: Diagnosis not present

## 2020-06-29 DIAGNOSIS — R21 Rash and other nonspecific skin eruption: Secondary | ICD-10-CM | POA: Diagnosis not present

## 2020-06-29 DIAGNOSIS — R0609 Other forms of dyspnea: Secondary | ICD-10-CM

## 2020-06-29 DIAGNOSIS — J449 Chronic obstructive pulmonary disease, unspecified: Secondary | ICD-10-CM | POA: Diagnosis not present

## 2020-06-29 DIAGNOSIS — J9811 Atelectasis: Secondary | ICD-10-CM | POA: Diagnosis not present

## 2020-06-29 DIAGNOSIS — R0602 Shortness of breath: Secondary | ICD-10-CM | POA: Diagnosis not present

## 2020-06-29 DIAGNOSIS — I517 Cardiomegaly: Secondary | ICD-10-CM | POA: Diagnosis not present

## 2020-06-29 MED ORDER — HYDROCHLOROTHIAZIDE 12.5 MG PO CAPS: 12.5000 mg | ORAL_CAPSULE | ORAL | 3 refills | Status: DC | PRN

## 2020-06-29 NOTE — Telephone Encounter (Signed)
Hey. I was trying to make sure [name] saw this. I saw this patient for hospital follow up. She was on the extra furosemide. Maritza saw her since then. I am not sure if the continued dosing of furosemide is necessary or not.

## 2020-06-29 NOTE — Progress Notes (Signed)
[name], female    DOB: Mar 04, 1945   MRN: [id number]   Brief patient profile:  21 yowf s/p LULobectomy 2012 stage 1A [name] s adjuvant rx  quit smoking 03/21/20 with admit to [name] / acute resp failure:   05/05/2020 Discharge Date:  05/22/2020 Consultants: Pulmonology, GI, wound care, cardiology  Principal Problem: Acute respiratory failure with hypoxia and hypercapnia Active Problems: Influenza A Type 2 MI (myocardial infarction) Metabolic encephalopathy Acute systolic congestive heart failure Coronary artery disease involving native coronary artery of native heart Dilated cardiomyopathy Acute blood loss anemia COPD with acute exacerbation Resolved Problems: Acute respiratory failure with hypercapnia  Hospital Course: [name] is a 75 y.o. female who presented with complaints of shortness of breath and altered mental status. Was found to have acute hypercapnic respiratory failure was placed on BiPAP in the ER. Patient had been tested positive for the flu. Patient required intubation and was admitted to ICU. Critical care was consulted. She was started on Tamiflu for influenza. Treatment for diabetes and hypothyroidism continued. She was found to have acute on chronic systolic heart failure as well. Patient has a cardiologist [name] where she was from where she will need to follow-up at discharge. Patient had came here on a trip to [name] from [name] when this started happening. She was found to be in atrial fibrillation and placed on amiodarone and beta-blocker temporarily. Though these were held briefly during the times when she had low blood pressures. Patient also was found to have anemia which required transfusion. Has anemia was found to be iron deficiency. GI saw the patient and felt endoscopic evaluation would be good when she is medically optimized. Patient was stabilized and transferred to the floor. Her mental status had improved very slowly  her oxygenation requirements have improved as well. She has history of lung cancer and lobectomy in the past and she continued to smoke prior to this admission. She is advised to stop smoking and she will need to follow-up with pulmonology outpatient. She has improved significantly now after prolonged course. At this point, she is on 3 L nasal cannula and has been set up for home O2. Patient would like to be discharged and will be discharged home in improved condition to follow-up closely with PCP cardiology and GI and pulmonology in the near future  Physical exam: No apparent distress awake alert and oriented x3 Regular rate and rhythm Chest clear to auscultation bilaterally Abdomen soft nontender nondistended  Pertinent Labs and Studies: Recent Labs  05/20/20 0520 05/21/20 0832 05/22/20 0302  WBC 18.0* 15.2* 16.4*  HGB 7.3* 7.6* 8.7*  HCT 25.0* 26.4* 29.4*  LABPLAT 444* 424* 394*  MCV 96.2* 96.4* 93.9  NEUTOPHILPCT 79.40 -- 82.00  LYMPHOPCT 9.90 -- 8.80  MONOPCT 8.30 -- 6.80  BASOPCT 0.20 -- 0.20  EOSPCT 0.10* -- 0.10*   Recent Labs  05/20/20 0520 05/21/20 0058  NA 141 141  K 3.4* 3.8  CL 99 99  CO2 >40* >40*  BUN 11 13  CREATININE 1.08* 1.24*  GLUCOSE 116* 164*  EGFRAA 59* 50*  EGFRNONAA 51* 43*  CALCIUM 8.4 8.5   No results for input(s): PROTIME, INR, PTT in the last 72 hours. No results for input(s): AST, ALT, BILITOT, BILIDIR, ALKPHOS, LIPASE, AMYLASE, ALBUMIN, AMMONIA in the last 72 hours. Recent Labs  05/20/20 0523 05/21/20 0058  BNP 123.2* 87.0     ID and Culture Results: Recent Labs  05/21/20 0058  GLUCOSE 164*  additional hx:  MI in 2012 and underwent emergent CABG.  She was noted to have a left upper lobe lung mass.  She had a left upper lobectomy a couple of months after her CABG.  The lung lesion turned out to be a stage Ia non-small cell carcinoma.  She did not require any adjuvant therapy      History of Present Illness  06/29/2020   Pulmonary/ 1st office eval/[name]  Chief Complaint  Patient presents with  . Pulmonary Consult     Referred by Megan Pol, NP. Pt with hx of left VATS- Dr [name] 2012. She states hospitalized in March 2022 with PNA, influenza and UTI. She states remote hx of taking Amiodarone.   Dyspnea:  Strolling ok / can do [employer]/ no HC parking  Cough: none  Sleep: bed is flat / on side  SABA use: prn not using   No obvious day to day or daytime variability or assoc excess/ purulent sputum or mucus plugs or hemoptysis or cp or chest tightness, subjective wheeze or overt sinus or hb symptoms.   Sleeping RA without nocturnal  or early am exacerbation  of respiratory  c/o's or need for noct saba. Also denies any obvious fluctuation of symptoms with weather or environmental changes or other aggravating or alleviating factors except as outlined above   No unusual exposure hx or h/o childhood pna/ asthma or knowledge of premature birth.  Current Allergies, Complete Past Medical History, Past Surgical History, Family History, and Social History were reviewed in [name] record.  ROS  The following are not active complaints unless bolded Hoarseness, sore throat, dysphagia, dental problems, itching, sneezing,  nasal congestion or discharge of excess mucus or purulent secretions, ear ache,   fever, chills, sweats, unintended wt loss or wt gain, classically pleuritic or exertional cp,  orthopnea pnd or arm/hand swelling  or leg swelling, presyncope, palpitations, abdominal pain, anorexia, nausea, vomiting, diarrhea  or change in bowel habits or change in bladder habits, change in stools or change in urine, dysuria, hematuria,  rash, arthralgias, visual complaints, headache, numbness, weakness or ataxia or problems with walking or coordination,  change in mood or  memory.           Past Medical History:  Diagnosis Date  . CAD (coronary artery disease)   . Cancer ([name])   .  Dyslipidemia   . GERD (gastroesophageal reflux disease)   . HTN (hypertension)   . Myocardial infarct ([place])   . Non-small cell lung cancer ([name]) 08/2010   Stage IA, status post left upper lobectomy July 2012  . Tobacco abuse     Outpatient Medications Prior to Visit - - NOTE:   Unable to verify as accurately reflecting what pt takes     Medication Sig Dispense Refill  . albuterol (VENTOLIN HFA) 108 (90 Base) MCG/ACT inhaler Inhale into the lungs.    Marland Kitchen aspirin 81 MG tablet Take 81 mg by mouth daily.    . Coenzyme Q10 300 MG CAPS Take by mouth.    . ferrous gluconate (IRON 27) 240 (27 FE) MG tablet Take 240 mg by mouth daily.    . furosemide (LASIX) 40 MG tablet Take 40 mg by mouth 2 (two) times daily.    Marland Kitchen levothyroxine (SYNTHROID) 25 MCG tablet TAKE 0.5 TABLETS (12.5 MCG TOTAL) BY MOUTH DAILY. IN ADDITION TO THE 50 MCG. 45 tablet 1  . levothyroxine (SYNTHROID) 50 MCG tablet TAKE 1 TABLET BY MOUTH EVERY DAY BEFORE  BREAKFAST 90 tablet 0  . metoprolol succinate (TOPROL-XL) 25 MG 24 hr tablet Take 1 tablet (25 mg total) by mouth daily. 90 tablet 3  . Omega-3 Fatty Acids (FISH OIL) 1000 MG CAPS Take 2,000 mg by mouth daily.    . rosuvastatin (CRESTOR) 40 MG tablet TAKE 1 TABLET BY MOUTH EVERY DAY 90 tablet 1  . amiodarone (PACERONE) 200 MG tablet Take by mouth.    . hydrochlorothiazide (MICROZIDE) 12.5 MG capsule Take 1 capsule (12.5 mg total) by mouth as needed. 30 capsule 3  . pantoprazole (PROTONIX) 40 MG tablet Take 1 tablet by mouth 2 (two) times daily.     No facility-administered medications prior to visit.     Objective:     BP 108/64 (BP Location: Left Arm, Cuff Size: Normal)   Pulse 73   Temp 97.9 F (36.6 C) (Temporal)   Ht 5\' 4"  (1.626 m)   Wt 208 lb 12.8 oz (94.7 kg)   SpO2 (!) 89% Comment: on RA  BMI 35.84 kg/m   SpO2: (!) 89 % (on RA)   Obese wf nad  HEENT : pt wearing mask not removed for exam due to covid - 19 concerns.   NECK :  without JVD/Nodes/TM/ nl  carotid upstrokes bilaterally   LUNGS: no acc muscle use,  Min barrel  contour chest wall with bilateral  slightly decreased bs s audible wheeze and  without cough on insp or exp maneuvers and min  Hyperresonant  to  percussion bilaterally     CV:  RRR  no s3 or murmur or increase in P2, and 1+ pitting edema both LE  ABD:  soft and nontender with pos end  insp Hoover's  in the supine position. No bruits or organomegaly appreciated, bowel sounds nl  MS:   Nl gait/  ext warm without deformities, calf tenderness, cyanosis or clubbing No obvious joint restrictions   SKIN: warm and dry without lesions    NEURO:  alert, approp, nl sensorium with  no motor or cerebellar deficits apparent.          CXR PA and Lateral:   06/29/2020 :    I personally reviewed images and agree with radiology impression as follows:   Similar appearance of chronic left lung base density secondary to mild eventration of the left hemidiaphragm and associated atelectasis and scarring. No Megan consolidation   Labs ordered/ reviewed:      Chemistry      Component Value Date/Time   NA 143 06/29/2020 1632   NA 143 05/30/2020 1502   K 3.9 06/29/2020 1632   CL 97 06/29/2020 1632   CO2 36 (H) 06/29/2020 1632   BUN 16 06/29/2020 1632   BUN 14 05/30/2020 1502   CREATININE 1.44 (H) 06/29/2020 1632   CREATININE 0.81 07/02/2016 1323      Component Value Date/Time   CALCIUM 10.1 06/29/2020 1632   ALKPHOS 72 03/21/2020 0855   AST 21 03/21/2020 0855   ALT 30 03/21/2020 0855   BILITOT 0.3 03/21/2020 0855        Lab Results  Component Value Date   WBC 12.4 (H) 06/29/2020   HGB 12.7 06/29/2020   HCT 41.3 06/29/2020   MCV 89.4 06/29/2020   PLT 298.0 06/29/2020     Lab Results  Component Value Date   DDIMER 1.76 (H) 06/29/2020      Lab Results  Component Value Date   TSH 4.400 03/21/2020     Lab Results  Component  Value Date   PROBNP 10535.0 (H) 07/10/2010       Lab Results  Component Value  Date   ESRSEDRATE 81 (H) 06/29/2020           Assessment   DOE (dyspnea on exertion) Onset March 2022 in setting of RAF/ anemia/ flu A -  06/29/2020   Walked RA  2 laps @ approx 237ft each @ fast  pace  stopped due to end of study, no sob  sats 86%    difficult to sort out respiratory symptoms of unknown origin for which  DDX  = almost all start with A and  include Adherence, Ace Inhibitors, Acid Reflux, Active Sinus Disease, Alpha 1 Antitripsin deficiency, Anxiety masquerading as Airways dz,  ABPA,  Allergy(esp in young), Aspiration (esp in elderly), Adverse effects of meds,  Active smoking or Vaping, A bunch of PE's/clot burden (a few small clots can't cause this syndrome unless there is already severe underlying pulm or vascular dz with poor reserve),  Anemia or thyroid disorder, plus two Bs  = Bronchiectasis and Beta blocker use..and one C= CHF   Adherence is always the initial "prime suspect" and is a multilayered concern that requires a "trust but verify" approach in every patient - starting with knowing how to use medications, especially inhalers, correctly, keeping up with refills and understanding the fundamental difference between maintenance and prns vs those medications only taken for a very short course and then stopped and not refilled.  - rec return with all meds in hand using a trust but verify approach to confirm accurate Medication  Reconciliation The principal here is that until we are certain that the  patients are doing what we've asked, it makes no sense to ask them to do more.   ? Anemia / thyroid dz > no longer anemic/ on synthroid   ? A bunch of PE's > doubt it as ddimer not that high relative to severity of symptoms in chronically ill pt but obligated to do v/q venous  Doppler as she is at risk  ?  Adverse drug effects/ on amiodarone - Patients typically have been on amiodarone for 6-12 months before this complication manifests.  Of note, serial clinical evaluation for  symptoms such as cough dyspnea or fevers is  the preferred method of monitoring for pulmonary toxicity because a decrease in DLCO or lung volumes is a nonspecific for toxicity. Pathologically amiodarone pulmonary toxicity may appear as interstitial pneumonitis, eosinophilic pneumonia, organizing pneumonia, pulmonary fibrosis or less commonly as diffuse alveolar hemorrhage, pulmonary nodules or pleural effusions.  Risk factors for pulmonary toxicity include age greater than 77, daily dose greater than equal to 400 mg, a high cumulative dose, or pre-existing lung disease   dshe is over 59 with pre-existing lung dz so has at least 2/4 risk factors > monitor serial sats   CHF, Megan dx in pt in afib and for now on amiodarone > f/u planned but to overt chf on cxr       Chronic respiratory failure with hypoxia and hypercapnia ([place]) HC03   06/29/2020  = 36 -  06/29/2020   Walked RA  2 laps @ approx 290ft each @ relatively pace  stopped due to end of study,  no sob but sats 86%  (walking much farther than she does at home presently)  sats in upper 80s ok given tendency to hypercarbia > advised to continue to monitor with goal of keeping them upper 80s   COPD GOLD III  Quit smoking 03/21/20  - Spirometry 05/18/15   FEV1 0.8 (39%)  Ratio 0.56   Really not limited by doe as extremely sedentary, not having aecopd and risk of exacerbation afib with bronchodilators so hold off for now and f/u in 4 weeks with pfts    Each maintenance medication was reviewed in detail including emphasizing most importantly the difference between maintenance and prns and under what circumstances the prns are to be triggered using an action plan format where appropriate.  Total time for H and P, chart review, counseling,  directly observing portions of ambulatory 02 saturation study/ and generating customized AVS unique to this office visit / same day charting = 48 min                     Christinia Gully, MD 06/29/2020

## 2020-06-29 NOTE — Patient Instructions (Addendum)
Please remember to go to the lab department   for your tests - we will call you with the results when they are available.  Please remember to go to the  x-ray department  for your tests - we will call you with the results when they are available    Make sure you check your oxygen saturation at your highest level of activity to be sure it stays over 90% and keep track of it at least once a week, more often if breathing getting worse, and let me know if losing ground.       Please schedule a follow up office visit in 4 weeks, sooner if needed with pfts on return.  Add:  V/q and venous doppler ordered 06/30/20

## 2020-06-30 ENCOUNTER — Other Ambulatory Visit: Payer: Self-pay | Admitting: Internal Medicine

## 2020-06-30 ENCOUNTER — Other Ambulatory Visit: Payer: Self-pay | Admitting: *Deleted

## 2020-06-30 ENCOUNTER — Ambulatory Visit: Payer: Medicare HMO

## 2020-06-30 DIAGNOSIS — R079 Chest pain, unspecified: Secondary | ICD-10-CM

## 2020-06-30 DIAGNOSIS — R7989 Other specified abnormal findings of blood chemistry: Secondary | ICD-10-CM

## 2020-06-30 DIAGNOSIS — R0600 Dyspnea, unspecified: Secondary | ICD-10-CM

## 2020-06-30 DIAGNOSIS — R0609 Other forms of dyspnea: Secondary | ICD-10-CM

## 2020-06-30 LAB — BASIC METABOLIC PANEL
BUN: 16 mg/dL (ref 6–23)
CO2: 36 mEq/L — ABNORMAL HIGH (ref 19–32)
Calcium: 10.1 mg/dL (ref 8.4–10.5)
Chloride: 97 mEq/L (ref 96–112)
Creatinine, Ser: 1.44 mg/dL — ABNORMAL HIGH (ref 0.40–1.20)
GFR: 36.05 mL/min — ABNORMAL LOW (ref >60.00)
Glucose, Bld: 92 mg/dL (ref 70–99)
Potassium: 3.9 mEq/L (ref 3.5–5.1)
Sodium: 143 mEq/L (ref 135–145)

## 2020-06-30 LAB — CBC WITH DIFFERENTIAL/PLATELET
Basophils Absolute: 0.1 10*3/uL (ref 0.0–0.1)
Basophils Relative: 0.9 % (ref 0.0–3.0)
Eosinophils Absolute: 0.2 10*3/uL (ref 0.0–0.7)
Eosinophils Relative: 1.7 % (ref 0.0–5.0)
HCT: 41.3 % (ref 36.0–46.0)
Hemoglobin: 12.7 g/dL (ref 12.0–15.0)
Lymphocytes Relative: 32.1 % (ref 12.0–46.0)
Lymphs Abs: 4 10*3/uL (ref 0.7–4.0)
MCHC: 30.7 g/dL (ref 30.0–36.0)
MCV: 89.4 fl (ref 78.0–100.0)
Monocytes Absolute: 1.3 10*3/uL — ABNORMAL HIGH (ref 0.1–1.0)
Monocytes Relative: 10.6 % (ref 3.0–12.0)
Neutro Abs: 6.8 10*3/uL (ref 1.4–7.7)
Neutrophils Relative %: 54.7 % (ref 43.0–77.0)
Platelets: 298 10*3/uL (ref 150.0–400.0)
RBC: 4.62 Mil/uL (ref 3.87–5.11)
RDW: 18.9 % — ABNORMAL HIGH (ref 11.5–15.5)
WBC: 12.4 10*3/uL — ABNORMAL HIGH (ref 4.0–10.5)

## 2020-06-30 LAB — SEDIMENTATION RATE: Sed Rate: 81 mm/hr — ABNORMAL HIGH (ref 0–30)

## 2020-06-30 NOTE — Progress Notes (Signed)
Spoke with pt and notified of results per Dr. Melvyn Novas. Pt verbalized understanding and denied any questions. Pt agreeable to the dopplers and vq and these have been ordered.

## 2020-07-02 ENCOUNTER — Encounter: Payer: Self-pay | Admitting: Internal Medicine

## 2020-07-02 DIAGNOSIS — J9612 Chronic respiratory failure with hypercapnia: Secondary | ICD-10-CM | POA: Insufficient documentation

## 2020-07-02 DIAGNOSIS — J9611 Chronic respiratory failure with hypoxia: Secondary | ICD-10-CM | POA: Insufficient documentation

## 2020-07-02 DIAGNOSIS — J449 Chronic obstructive pulmonary disease, unspecified: Secondary | ICD-10-CM | POA: Insufficient documentation

## 2020-07-02 NOTE — Assessment & Plan Note (Signed)
Quit smoking 03/21/20  - Spirometry 05/18/15   FEV1 0.8 (39%)  Ratio 0.56   Really not limited by doe as extremely sedentary, not having aecopd and risk of exacerbation afib with bronchodilators so hold off for now and f/u in 4 weeks with pfts    Each maintenance medication was reviewed in detail including emphasizing most importantly the difference between maintenance and prns and under what circumstances the prns are to be triggered using an action plan format where appropriate.  Total time for H and P, chart review, counseling,  directly observing portions of ambulatory 02 saturation study/ and generating customized AVS unique to this office visit / same day charting = 48 min

## 2020-07-02 NOTE — Assessment & Plan Note (Signed)
HC03   06/29/2020  = 36 -  06/29/2020   Walked RA  2 laps @ approx 218ft each @ relatively pace  stopped due to end of study,  no sob but sats 86%  (walking much farther than she does at home presently)  sats in upper 80s ok given tendency to hypercarbia > advised to continue to monitor with goal of keeping them upper 80s

## 2020-07-02 NOTE — Assessment & Plan Note (Signed)
Onset March 2022 in setting of RAF/ anemia/ flu A -  06/29/2020   Walked RA  2 laps @ approx 210ft each @ fast  pace  stopped due to end of study, no sob  sats 86%    difficult to sort out respiratory symptoms of unknown origin for which  DDX  = almost all start with A and  include Adherence, Ace Inhibitors, Acid Reflux, Active Sinus Disease, Alpha 1 Antitripsin deficiency, Anxiety masquerading as Airways dz,  ABPA,  Allergy(esp in young), Aspiration (esp in elderly), Adverse effects of meds,  Active smoking or Vaping, A bunch of PE's/clot burden (a few small clots can't cause this syndrome unless there is already severe underlying pulm or vascular dz with poor reserve),  Anemia or thyroid disorder, plus two Bs  = Bronchiectasis and Beta blocker use..and one C= CHF   Adherence is always the initial "prime suspect" and is a multilayered concern that requires a "trust but verify" approach in every patient - starting with knowing how to use medications, especially inhalers, correctly, keeping up with refills and understanding the fundamental difference between maintenance and prns vs those medications only taken for a very short course and then stopped and not refilled.  - rec return with all meds in hand using a trust but verify approach to confirm accurate Medication  Reconciliation The principal here is that until we are certain that the  patients are doing what we've asked, it makes no sense to ask them to do more.   ? Anemia / thyroid dz > no longer anemic/ on synthroid   ? A bunch of PE's > doubt it as ddimer not that high relative to severity of symptoms in chronically ill pt but obligated to do v/q venous  Doppler as she is at risk  ?  Adverse drug effects/ on amiodarone - Patients typically have been on amiodarone for 6-12 months before this complication manifests.  Of note, serial clinical evaluation for symptoms such as cough dyspnea or fevers is  the preferred method of monitoring for pulmonary  toxicity because a decrease in DLCO or lung volumes is a nonspecific for toxicity. Pathologically amiodarone pulmonary toxicity may appear as interstitial pneumonitis, eosinophilic pneumonia, organizing pneumonia, pulmonary fibrosis or less commonly as diffuse alveolar hemorrhage, pulmonary nodules or pleural effusions.  Risk factors for pulmonary toxicity include age greater than 58, daily dose greater than equal to 400 mg, a high cumulative dose, or pre-existing lung disease   dshe is over 58 with pre-existing lung dz so has at least 2/4 risk factors > monitor serial sats   CHF, new dx in pt in afib and for now on amiodarone > f/u planned but to overt chf on cxr

## 2020-07-03 ENCOUNTER — Other Ambulatory Visit: Payer: Self-pay

## 2020-07-03 ENCOUNTER — Encounter: Payer: Self-pay | Admitting: *Deleted

## 2020-07-03 ENCOUNTER — Ambulatory Visit (HOSPITAL_COMMUNITY)
Admission: RE | Admit: 2020-07-03 | Discharge: 2020-07-03 | Disposition: A | Discharge status: Home / Self Care | Payer: Medicare HMO | Source: Ambulatory Visit | Attending: Internal Medicine | Admitting: Internal Medicine

## 2020-07-03 DIAGNOSIS — I2510 Atherosclerotic heart disease of native coronary artery without angina pectoris: Secondary | ICD-10-CM | POA: Diagnosis not present

## 2020-07-03 DIAGNOSIS — R079 Chest pain, unspecified: Secondary | ICD-10-CM | POA: Insufficient documentation

## 2020-07-03 DIAGNOSIS — R0600 Dyspnea, unspecified: Secondary | ICD-10-CM | POA: Insufficient documentation

## 2020-07-03 DIAGNOSIS — R7989 Other specified abnormal findings of blood chemistry: Secondary | ICD-10-CM

## 2020-07-03 DIAGNOSIS — J90 Pleural effusion, not elsewhere classified: Secondary | ICD-10-CM | POA: Diagnosis not present

## 2020-07-03 DIAGNOSIS — R0602 Shortness of breath: Secondary | ICD-10-CM | POA: Diagnosis not present

## 2020-07-03 DIAGNOSIS — I517 Cardiomegaly: Secondary | ICD-10-CM | POA: Diagnosis not present

## 2020-07-03 MED ORDER — TECHNETIUM TO 99M ALBUMIN AGGREGATED
4.4000 | Freq: Once | INTRAVENOUS | Status: AC | PRN
  Administered 2020-07-03: 4.4 via INTRAVENOUS

## 2020-07-03 NOTE — Progress Notes (Signed)
Spoke with pt and notified of results per Dr. Wert. Pt verbalized understanding and denied any questions. 

## 2020-07-06 ENCOUNTER — Telehealth: Payer: Self-pay | Admitting: Internal Medicine

## 2020-07-06 ENCOUNTER — Other Ambulatory Visit: Payer: Self-pay

## 2020-07-06 ENCOUNTER — Ambulatory Visit (HOSPITAL_COMMUNITY)
Admission: RE | Admit: 2020-07-06 | Discharge: 2020-07-06 | Disposition: A | Discharge status: Home / Self Care | Payer: Medicare HMO | Source: Ambulatory Visit | Attending: Internal Medicine | Admitting: Internal Medicine

## 2020-07-06 DIAGNOSIS — R7989 Other specified abnormal findings of blood chemistry: Secondary | ICD-10-CM | POA: Diagnosis not present

## 2020-07-06 DIAGNOSIS — R079 Chest pain, unspecified: Secondary | ICD-10-CM

## 2020-07-06 NOTE — Telephone Encounter (Signed)
Spoke with Megan from Vascular ultrasound who wanted to report that patient is NEGATIVE in both legs for DVT. Will route to Dr. Melvyn Novas as FYI ONLY. Nothing further needed at this time.

## 2020-07-13 ENCOUNTER — Encounter: Payer: Self-pay | Admitting: *Deleted

## 2020-07-14 ENCOUNTER — Ambulatory Visit: Payer: Medicare HMO | Admitting: Family

## 2020-07-24 ENCOUNTER — Encounter: Payer: Self-pay | Admitting: Physician Assistant

## 2020-07-24 ENCOUNTER — Telehealth: Payer: Self-pay | Admitting: Cardiovascular Disease

## 2020-07-24 ENCOUNTER — Ambulatory Visit (INDEPENDENT_AMBULATORY_CARE_PROVIDER_SITE_OTHER): Payer: Medicare HMO | Admitting: Physician Assistant

## 2020-07-24 ENCOUNTER — Other Ambulatory Visit: Payer: Self-pay

## 2020-07-24 VITALS — BP 116/70 | HR 72 | Temp 96.8°F | Ht 64.0 in | Wt 213.6 lb

## 2020-07-24 DIAGNOSIS — I10 Essential (primary) hypertension: Secondary | ICD-10-CM | POA: Diagnosis not present

## 2020-07-24 DIAGNOSIS — I4891 Unspecified atrial fibrillation: Secondary | ICD-10-CM

## 2020-07-24 DIAGNOSIS — R350 Frequency of micturition: Secondary | ICD-10-CM

## 2020-07-24 DIAGNOSIS — R300 Dysuria: Secondary | ICD-10-CM | POA: Diagnosis not present

## 2020-07-24 DIAGNOSIS — D72829 Elevated white blood cell count, unspecified: Secondary | ICD-10-CM | POA: Diagnosis not present

## 2020-07-24 DIAGNOSIS — R600 Localized edema: Secondary | ICD-10-CM | POA: Diagnosis not present

## 2020-07-24 DIAGNOSIS — D649 Anemia, unspecified: Secondary | ICD-10-CM

## 2020-07-24 DIAGNOSIS — J449 Chronic obstructive pulmonary disease, unspecified: Secondary | ICD-10-CM | POA: Diagnosis not present

## 2020-07-24 DIAGNOSIS — Z87440 Personal history of urinary (tract) infections: Secondary | ICD-10-CM

## 2020-07-24 LAB — POCT URINALYSIS DIPSTICK
Bilirubin, UA: NEGATIVE
Blood, UA: NEGATIVE
Glucose, UA: NEGATIVE
Leukocytes, UA: NEGATIVE
Nitrite, UA: NEGATIVE
Protein, UA: POSITIVE — AB
Spec Grav, UA: 1.02 (ref 1.010–1.025)
Urobilinogen, UA: 0.2 E.U./dL
pH, UA: 7.5 (ref 5.0–8.0)

## 2020-07-24 NOTE — Telephone Encounter (Signed)
*  STAT* If patient is at the pharmacy, call can be transferred to refill team.   1. Which medications need to be refilled? (please list name of each medication and dose if known)  metoprolol succinate (TOPROL-XL) 100 MG 24 hr tablet  2. Which pharmacy/location (including street and city if local pharmacy) is medication to be sent to? [id number] - [place], [place] - [address]  3. Do they need a 30 day or 90 day supply? [place]

## 2020-07-24 NOTE — Patient Instructions (Signed)

## 2020-07-24 NOTE — Progress Notes (Signed)
Established Patient Office Visit  Subjective:  Patient ID: [name], female    DOB: [birth year]  Age: 75 y.o. MRN: [id number]  CC:  Chief Complaint  Patient presents with  . Hypertension  . Follow-up    HPI [name] presents for follow up on hypertension. Patient has been checking BP at home and readings average 110-120s/60-70s. States lower extremity swelling is about the same. Has swelling daily with more swelling of R lower extremity than left. Water pill instructions are to take as needed for swelling which it would be daily for her so has tried to limit use and only taken about 2 doses. States she does have issues with high potassium which fluctuates. When she was hospitalized in the hospital she was discharged with lasix, however her cardiologist had her on hydrochlorothiazide. Tries to elevate her legs as tolerable due to RLS. Denies chest pain, palpitations, or dizziness.    Atrial fibrillation: Patient denies arrhythmia, increased fatigue or palpitations. States has experienced side effects of hair loss and muscle spasms with amiodarone 200 mg daily and requesting to discontinue medication. States has always been on metoprolol succinate 100 mg which was reduced to 25 mg during her hospitalization due to low blood pressures and starting new med amiodarone. Patient inquiring to resume 100 mg dose.  COPD: States recently saw Pulmonology and unsure if will do PFTs due to protocol in place due to the pandemic. States her oxygen saturations range from 88-92%.   Anemia: Patient was started on iron supplement for anemia. Recently had labs done with pulmonology and states RDW was elevated and WBC was elevated again. Reports feels well. In the hospital was treated for UTI and has a history of atypical UTI presentations with minimal urinary symptoms. No hematochezia, hemoptysis or melena.  Past Medical History:  Diagnosis Date  . CAD (coronary artery disease)   . Cancer ([place])    . Dyslipidemia   . GERD (gastroesophageal reflux disease)   . HTN (hypertension)   . Myocardial infarct ([place])   . Non-small cell lung cancer ([place]) 08/2010   Stage IA, status post left upper lobectomy July 2012  . Tobacco abuse     Past Surgical History:  Procedure Laterality Date  . CARDIAC CATHETERIZATION  07/06/2010   see CABG report - pt sent to OR  . CARDIOVASCULAR STRESS TEST  09/11/2010   R/S MV - normal perfusion in all regions, EF 46%, no scintigraphic evidence of inducible myocardial ischemia; global LV systolic function mildly reduced; no significant wall motion abnormalities noted; Exercise capacity 7 METS; EKG negative for ischemia; low risk study, no signifcant change from previous study 08/2003  . CORONARY ARTERY BYPASS GRAFT  07/11/2010   LIMA to LAD; SVG to 2nd branch OM; SVG to posterior descending artery  . LEFT VATS  09/17/2010  . TEE WITHOUT CARDIOVERSION  07/06/2010   during emergent CABG surgery; 2-3+ mitral regurgitation    Family History  Problem Relation Age of Onset  . Hypothyroidism Father   . Heart attack Father   . Aplastic anemia Maternal Grandfather   . Stroke Paternal Grandfather     Social History   Socioeconomic History  . Marital status: Married    Spouse name: Not on file  . Number of children: Not on file  . Years of education: Not on file  . Highest education level: Not on file  Occupational History  . Not on file  Tobacco Use  . Smoking status: Former Smoker  Packs/day: 1.00    Years: 58.00    Pack years: 58.00    Types: Cigarettes    Quit date: 03/21/2020    Years since quitting: 0.3  . Smokeless tobacco: Never Used  Vaping Use  . Vaping Use: Never used  Substance and Sexual Activity  . Alcohol use: No    Alcohol/week: 0.0 standard drinks  . Drug use: No  . Sexual activity: Never  Other Topics Concern  . Not on file  Social History Narrative  . Not on file   Social Determinants of Health   Financial Resource Strain:  Not on file  Food Insecurity: Not on file  Transportation Needs: Not on file  Physical Activity: Not on file  Stress: Not on file  Social Connections: Not on file  Intimate Partner Violence: Not on file    Outpatient Medications Prior to Visit  Medication Sig Dispense Refill  . aspirin 81 MG tablet Take 81 mg by mouth daily.    . Coenzyme Q10 300 MG CAPS Take by mouth.    . ferrous gluconate (FERGON) 240 (27 FE) MG tablet Take 240 mg by mouth daily.    . hydrochlorothiazide (MICROZIDE) 12.5 MG capsule SMARTSIG:1 Capsule(s) By Mouth PRN    . levothyroxine (SYNTHROID) 25 MCG tablet TAKE 0.5 TABLETS (12.5 MCG TOTAL) BY MOUTH DAILY. IN ADDITION TO THE 50 MCG. 45 tablet 1  . levothyroxine (SYNTHROID) 50 MCG tablet TAKE 1 TABLET BY MOUTH EVERY DAY BEFORE BREAKFAST 90 tablet 0  . metoprolol succinate (TOPROL-XL) 25 MG 24 hr tablet Take 1 tablet (25 mg total) by mouth daily. 90 tablet 3  . Omega-3 Fatty Acids (FISH OIL) 1000 MG CAPS Take 2,000 mg by mouth daily.    . rosuvastatin (CRESTOR) 40 MG tablet TAKE 1 TABLET BY MOUTH EVERY DAY 90 tablet 1  . albuterol (VENTOLIN HFA) 108 (90 Base) MCG/ACT inhaler Inhale into the lungs. (Patient not taking: Reported on 07/24/2020)    . furosemide (LASIX) 40 MG tablet Take 40 mg by mouth 2 (two) times daily. (Patient not taking: Reported on 07/24/2020)     No facility-administered medications prior to visit.    No Known Allergies  ROS Review of Systems  A fourteen system review of systems was performed and found to be positive as per HPI.   Objective:    Physical Exam General:  Well Developed, well nourished, appropriate for stated age, in no acute distress  Neuro:  Alert and oriented,  extra-ocular muscles intact  HEENT:  Normocephalic, atraumatic, neck supple Skin:  no gross rash. ? bluish-grey discoloration of nose and perioral area. Abdomen:Non-tender, non-distended, neg CVA b/l  Cardiac:  RRR Respiratory:  ECTA B/L with decreased breath sounds,  Not using accessory muscles, speaking in full sentences- unlabored. Vascular:  Ext warm, no cyanosis apprec.; 1+ pitting edema Psych:  No HI/SI, judgement and insight good, Euthymic mood. Full Affect.   BP 116/70   Pulse 72   Temp (!) 96.8 F (36 C)   Ht '5\' 4"'  (1.626 m)   Wt 213 lb 9.6 oz (96.9 kg)   SpO2 92%   BMI 36.66 kg/m  Wt Readings from Last 3 Encounters:  07/24/20 213 lb 9.6 oz (96.9 kg)  06/29/20 208 lb 12.8 oz (94.7 kg)  06/20/20 208 lb 4.8 oz (94.5 kg)     Health Maintenance Due  Topic Date Due  . Pneumococcal Vaccine 73-4 Years old (1 of 4 - PCV13) Never done  . COVID-19 Vaccine (1) Never  done  . Hepatitis C Screening  Never done  . Zoster Vaccines- Shingrix (2 of 2) 08/09/2019  . PNA vac Low Risk Adult (2 of 2 - PCV13) 01/28/2020    There are no preventive care reminders to display for this patient.  Lab Results  Component Value Date   TSH 4.400 03/21/2020   Lab Results  Component Value Date   WBC 8.9 07/24/2020   HGB 14.0 07/24/2020   HCT 45.4 07/24/2020   MCV 86 07/24/2020   PLT 262 07/24/2020   Lab Results  Component Value Date   NA 145 (H) 07/24/2020   K 4.8 07/24/2020   CO2 29 07/24/2020   GLUCOSE 89 07/24/2020   BUN 13 07/24/2020   CREATININE 1.06 (H) 07/24/2020   BILITOT <0.2 07/24/2020   ALKPHOS 83 07/24/2020   AST 24 07/24/2020   ALT 24 07/24/2020   PROT 7.6 07/24/2020   ALBUMIN 4.4 07/24/2020   CALCIUM 10.0 07/24/2020   EGFR 55 (L) 07/24/2020   GFR 36.05 (L) 06/29/2020   Lab Results  Component Value Date   CHOL 123 03/21/2020   Lab Results  Component Value Date   HDL 46 03/21/2020   Lab Results  Component Value Date   LDLCALC 56 03/21/2020   Lab Results  Component Value Date   TRIG 119 03/21/2020   Lab Results  Component Value Date   CHOLHDL 2.7 03/21/2020   Lab Results  Component Value Date   HGBA1C 6.1 (H) 06/14/2019      Assessment & Plan:   Problem List Items Addressed This Visit      Cardiovascular  and Mediastinum   HTN (hypertension) (Chronic)   Relevant Medications   hydrochlorothiazide (MICROZIDE) 12.5 MG capsule   Other Relevant Orders   Comp Met (CMET) (Completed)   Atrial fibrillation (HCC)   Relevant Medications   hydrochlorothiazide (MICROZIDE) 12.5 MG capsule    Other Visit Diagnoses    Lower extremity edema    -  Primary   Relevant Medications   hydrochlorothiazide (MICROZIDE) 12.5 MG capsule   Leukocytosis, unspecified type       Relevant Orders   CBC w/Diff (Completed)   Anemia, unspecified type       Relevant Orders   CBC w/Diff (Completed)   Iron, TIBC and Ferritin Panel (Completed)   Urinary frequency       Relevant Orders   POCT urinalysis dipstick (Completed)   Urine Culture   History of UTI       Relevant Orders   Urine Culture     Atrial fibrillation: -Regular rhythm on exam today. Muscle spasm and alopecia are potential side effects with amiodarone and recommend to stop medication. Advised patient to increase metoprolol to 50 mg, start taking 2 tablets of 25 mg. Pulse today is 72 bpm and BP stable at 116/70. Advised to continue monitoring BP and pulse at home. Monitor for hypotensive episodes and cardiac symptoms including arrhythmia. Will defer additional treatment adjustments to Cardiology. Patient has upcoming appointment 07/27/20.  -Eliquis and aspirin was on hold during hospitalization due to acute on chronic anemia. Will defer to cardiology. Will repeat CBC. Recent CBC, hemoglobin 12.7 hematocrit 41.3.  Hypertension: -Improved.  -Advised to increase metoprolol succinate to 50 mg and closely monitor BP and pulse at home. -Will continue to monitor.  COPD Gold III: -Followed by Pulmonology. -Reviewed consult, bronchodilators on hold due to possibility of exacerbating afib. -Recommend  tobacco cessation. -Oxygen saturation today 92%.  Lower extremity edema: -Recommend  to continue with low sodium diet, use compression socks and elevation as  tolerated. -Discussed with patient to discuss with Cardiologist taking diuretic daily for chronic swelling with close monitoring of electrolytes. Recent CMP: sodium 143, potassium 3.9. Recent Cr and GFR declined from baseline, will repeat today.   Anemia: -Recent CBC: RBC 4.62, hemoglobin 12.7, hematocrit 41.3, MCV 89.4, MCHC 30.7, RDW 18.9 -Patient on ferrous gluconate. -Will repeat CBC w/diff and collect iron panel.   Leukocytosis: -Etiology unclear. Recent sed rate mildly elevated so likely secondary leukocytosis.  -Recent WBC 12.4 (previously 9.5) -Will repeat CBC w/diff today. Will collect UA and send for urine culture to r/o UTI.  History of UTI: -Patient has a history of atypical UTI and recently treated. Will collect UA and send for urine culture to r/o UTI. -UA positive for protein, will collect CMP to recheck renal function.     No orders of the defined types were placed in this encounter.   Follow-up: Return in about 8 weeks (around 09/18/2020) for HTN, edema, A-fib.    Lorrene Reid, PA-C

## 2020-07-25 LAB — COMPREHENSIVE METABOLIC PANEL
ALT: 24 IU/L (ref 0–32)
AST: 24 IU/L (ref 0–40)
Albumin/Globulin Ratio: 1.4 (ref 1.2–2.2)
Albumin: 4.4 g/dL (ref 3.7–4.7)
Alkaline Phosphatase: 83 IU/L (ref 44–121)
BUN/Creatinine Ratio: 12 (ref 12–28)
BUN: 13 mg/dL (ref 8–27)
Bilirubin Total: <0.2 mg/dL (ref 0.0–1.2)
CO2: 29 mmol/L (ref 20–29)
Calcium: 10 mg/dL (ref 8.7–10.3)
Chloride: 101 mmol/L (ref 96–106)
Creatinine, Ser: 1.06 mg/dL — ABNORMAL HIGH (ref 0.57–1.00)
Globulin, Total: 3.2 g/dL (ref 1.5–4.5)
Glucose: 89 mg/dL (ref 65–99)
Potassium: 4.8 mmol/L (ref 3.5–5.2)
Sodium: 145 mmol/L — ABNORMAL HIGH (ref 134–144)
Total Protein: 7.6 g/dL (ref 6.0–8.5)
eGFR: 55 mL/min/{1.73_m2} — ABNORMAL LOW (ref >59)

## 2020-07-25 LAB — CBC WITH DIFFERENTIAL/PLATELET
Basophils Absolute: 0.1 10*3/uL (ref 0.0–0.2)
Basos: 1 %
EOS (ABSOLUTE): 0.2 10*3/uL (ref 0.0–0.4)
Eos: 2 %
Hematocrit: 45.4 % (ref 34.0–46.6)
Hemoglobin: 14 g/dL (ref 11.1–15.9)
Immature Grans (Abs): 0 10*3/uL (ref 0.0–0.1)
Immature Granulocytes: 0 %
Lymphocytes Absolute: 3.1 10*3/uL (ref 0.7–3.1)
Lymphs: 35 %
MCH: 26.6 pg (ref 26.6–33.0)
MCHC: 30.8 g/dL — ABNORMAL LOW (ref 31.5–35.7)
MCV: 86 fL (ref 79–97)
Monocytes Absolute: 1.1 10*3/uL — ABNORMAL HIGH (ref 0.1–0.9)
Monocytes: 13 %
Neutrophils Absolute: 4.4 10*3/uL (ref 1.4–7.0)
Neutrophils: 49 %
Platelets: 262 10*3/uL (ref 150–450)
RBC: 5.26 x10E6/uL (ref 3.77–5.28)
RDW: 15.3 % (ref 11.7–15.4)
WBC: 8.9 10*3/uL (ref 3.4–10.8)

## 2020-07-25 LAB — IRON,TIBC AND FERRITIN PANEL
Ferritin: 26 ng/mL (ref 15–150)
Iron Saturation: 12 % — ABNORMAL LOW (ref 15–55)
Iron: 54 ug/dL (ref 27–139)
Total Iron Binding Capacity: 440 ug/dL (ref 250–450)
UIBC: 386 ug/dL — ABNORMAL HIGH (ref 118–369)

## 2020-07-25 MED ORDER — METOPROLOL SUCCINATE ER 25 MG PO TB24: 50.0000 mg | ORAL_TABLET | Freq: Every day | ORAL | 0 refills | Status: DC

## 2020-07-25 MED ORDER — METOPROLOL SUCCINATE ER 100 MG PO TB24: 100.0000 mg | ORAL_TABLET | Freq: Every day | ORAL | 3 refills | Status: DC

## 2020-07-25 NOTE — Telephone Encounter (Signed)
Sent in refills for patient's Metoprolol.

## 2020-07-26 DIAGNOSIS — J9601 Acute respiratory failure with hypoxia: Secondary | ICD-10-CM | POA: Diagnosis not present

## 2020-07-26 LAB — URINE CULTURE

## 2020-07-27 ENCOUNTER — Encounter: Payer: Self-pay | Admitting: Cardiovascular Disease

## 2020-07-27 ENCOUNTER — Ambulatory Visit: Payer: Medicare HMO | Admitting: Cardiovascular Disease

## 2020-07-27 ENCOUNTER — Other Ambulatory Visit: Payer: Self-pay

## 2020-07-27 ENCOUNTER — Telehealth: Payer: Self-pay | Admitting: Cardiovascular Disease

## 2020-07-27 DIAGNOSIS — E039 Hypothyroidism, unspecified: Secondary | ICD-10-CM

## 2020-07-27 DIAGNOSIS — I4891 Unspecified atrial fibrillation: Secondary | ICD-10-CM | POA: Diagnosis not present

## 2020-07-27 DIAGNOSIS — E668 Other obesity: Secondary | ICD-10-CM

## 2020-07-27 DIAGNOSIS — E785 Hyperlipidemia, unspecified: Secondary | ICD-10-CM

## 2020-07-27 DIAGNOSIS — Z951 Presence of aortocoronary bypass graft: Secondary | ICD-10-CM

## 2020-07-27 DIAGNOSIS — Z85118 Personal history of other malignant neoplasm of bronchus and lung: Secondary | ICD-10-CM

## 2020-07-27 DIAGNOSIS — E669 Obesity, unspecified: Secondary | ICD-10-CM

## 2020-07-27 LAB — ALPHA-1 ANTITRYPSIN PHENOTYPE: A-1 Antitrypsin, Ser: 219 mg/dL — ABNORMAL HIGH (ref 83–199)

## 2020-07-27 LAB — D-DIMER, QUANTITATIVE: D-Dimer, Quant: 1.76 mcg/mL FEU — ABNORMAL HIGH (ref <0.50)

## 2020-07-27 NOTE — Telephone Encounter (Signed)
This RN returned patient's call. This RN had noticed an area of possible confusion on patient's AVS and wanted to clarify Dr. Evette Georges instructions. Patient to take 50mg  metoprolol for 1 month (as stated in original AVS), patient then to re-evaluate with Dr. Claiborne Billings regarding heart rate and blood pressure before changing any medication. Patient verbalized understanding of corrected AVS instructions. Patient aware correct AVS is available on MyChart, patient reports she is able to use MyChart. This RN also corrected patient's medication list, per pt request. Patient verbalized understanding of instructions, all questions/concerns addressed at this time.

## 2020-07-27 NOTE — Progress Notes (Signed)
Patient ID: [name], female   DOB: [birth year], 75 y.o.   MRN: [id number] n       HPI: [name] is a 75 y.o. female who presents to the office today for a 4 month follow up cardiology evaluation.  [name] suffered initial anterior wall myocardial infarction with ventricular fibrillation arrest requiring resuscitation and PTCA of her LAD in 1997. On 07/05/2010 she presented with increasing shortness of breath and was found to have critical life threatening anatomy with 99% left main equivalent disease.   Later that afternoon she underwent emergent CABG revascularization surgery by [name] with a LIMA to the LAD, a vein graft to the second branch of the obtuse marginal vessel, and vein graft to the PDA.  Chest x-rays disclosed a left upper lung nodule and ultimately this proved to be adenocarcinoma for which she ultimately underwent upper lobe resection in July 2012. Additional problems include hypertension, hyperlipidemia, obesity and probable metabolic syndrome. A nuclear perfusion study in July 2012 showed a post stress ejection fraction of 46% but otherwise fairly normal perfusion. EF at cath was 35% prior to surgery.   In December 2013 an NMR lipoprofile revealed that despite an LDL of 50 she had increased LDL particle #1312, and increased small LDL particle number at 761. Her insulin resistance was significantly elevated at 70. HDL particle number was low at 26.8 and a triglyceride of 152.evaluation.  An echo Doppler study on 08/04/2012 showed an ejection fraction at 50 - 55%. She had mild LVH. There was grade 1 diastolic dysfunction with distal anterior, anteroseptal and apical LAD scar. There is mild tricuspid regurgitation and mild mitral regurgitation. PA pressure estimated 32 mm.  A LexiScan study in 07/2012 was low risk. Ejection fraction was 56%. There is no evidence for ischemia. She had laboratory which showed an LDL particle number at 830 with a target LDL of 38  HDL 42 triglycerides 170. Her insulin resistance was elevated at 60. Hemoglobin A1c was 5.7.  When I last saw her in October 2017 she was still smoking one pack of cigarettes every 3 day.  She sees [name] on a yearly basis and  saw him in August 2017..  A follow-up chest CT was reviewed and she was stable without evidence of local recurrence or metastatic disease status post left upper lobectomy for stage IA non-small cell carcinoma.  Over the past year, she denies any episodes of recurrent chest pain or awareness of palpitations.  She underwent a nuclear perfusion study on 11/05/2016, which revealed normal perfusion and function without scar or ischemia. She saw [name] in August 2018 and during that evaluation, she was significantly hypertensive with a blood pressure 170/87.  She was on Toprol-XL 100 mg, rosuvastatin 40 mg, and levothyroxine 50 g.  Unfortunately she still smokes and is smoking anywhere from 1-3/4 of a pack daily.  She had laboratory in May and on one chemistry evaluation.  Her potassium was 5.5, a follow-up evaluation showed normal potassium.  Cholesterol was 123, HDL 39, and LDL 59.    When I saw her in October 2018, I initiated amlodipine 5 mg.  She was undergoing low dose CT imaging for CA.  We again discussed complete smoking cessation.    I saw her in January 2019. Over the prior year she hadshe has been going back and forth between [place] and [place].    She denies any significant shortness of breath.  She has not been very active.  Unfortunately she continues to smoke cigarettes and typically a pack of cigarettes may last 2 to 3 days. She has seen [name] for cardiothoracic surgery follow-up in September 2019.  Laboratory in December 2019 showed an LDL cholesterol at 44, HDL 41, total cholesterol 114 and triglycerides 146.  She continued to be on amlodipine 5 mg and Toprol-XL 100 mg for hypertension and her CAD and  rosuvastatin 40 mg for  hyperlipidemia in addition to omega-3 fatty acid.  She has hypothyroidism on levothyroxine.  She continues to be on daily aspirin.    She was evaluated in January 2020 at which time she remained stable from a cardiac standpoint.  She had seen [name] in follow-up.  She sees [name] for primary care.  She just completed lab work on January 22, 2019.  Lipid studies were excellent with total cholesterol 115, triglycerides 116, HDL 41, and LDL 53.  Renal function was stable with a BUN of 12 and creatinine 0.79.  TSH was borderline increased at 4.68.  CBC was stable.  She remains asymptomatic with reference to chest pain or shortness of breath.  She has not been successful with weight loss.    I saw her in a telemedicine evaluation in June 2021.  Unfortunately she was still smoking cigarettes.  She underwent a follow-up echo Doppler study on July 27, 2019 which showed an EF of 50 to 55% with septal hypokinesis consistent with her prior CABG revascularization.  There was mild asymmetric LVH and grade 2 diastolic dysfunction.  Tissue Doppler was abnormal with elevated left ventricular end-diastolic pressure.  She was without recurrent anginal symptoms.  Last saw her in February 2022.  At that time she can continue to smoke cigarettes with a pack lasting approximately 1-1/2 days.  She had no intention to quit.  She had seen [name] for follow-up evaluation.  Recent laboratory had showed a potassium of 5.6 and she was drinking significant amount of orange juice.  She denied palpitations.  Was having swelling and I recommended HCTZ 12.5 mg to take for swelling and then changed to on an as-needed basis.  I recommended follow-up laboratory for her potassium.  Following that evaluation, she drove to [place].  Apparently on her way home she became ill and was hospitalized in [facility] for 2 weeks.  Apparently she required 2 units of packed red blood cells.  During her hospitalization, he  apparently went into atrial fibrillation and was placed on amiodarone and beta-blocker temporarily.  He is were held at times during low blood pressures.  She was found to have iron deficiency edema.  Her medications were adjusted.  Upon return she was evaluated by Dr. Christinia Gully her pulmonologist.  She stopped smoking in February 2022.  She recently saw her primary provider, Lorrene Reid, PAC.  Amiodarone was discontinued on July 24, 2020.  Presently, she denies any chest pain.  Unaware of any recurrent atrial fibrillation.  She is scheduled to have future pulmonary function studies in July.  She admits to leg swelling right greater than left.  He presents for evaluation.  Past Medical History:  Diagnosis Date   CAD (coronary artery disease)    Cancer ([place])    Dyslipidemia    GERD (gastroesophageal reflux disease)    HTN (hypertension)    Myocardial infarct (HCC)    Non-small cell lung cancer ([place]) 08/2010   Stage IA, status post left upper lobectomy July 2012   Tobacco abuse     Past  Surgical History:  Procedure Laterality Date   CARDIAC CATHETERIZATION  07/06/2010   see CABG report - pt sent to OR   CARDIOVASCULAR STRESS TEST  09/11/2010   R/S MV - normal perfusion in all regions, EF 46%, no scintigraphic evidence of inducible myocardial ischemia; global LV systolic function mildly reduced; no significant wall motion abnormalities noted; Exercise capacity 7 METS; EKG negative for ischemia; low risk study, no signifcant change from previous study 08/2003   CORONARY ARTERY BYPASS GRAFT  07/11/2010   LIMA to LAD; SVG to 2nd branch OM; SVG to posterior descending artery   LEFT VATS  09/17/2010   TEE WITHOUT CARDIOVERSION  07/06/2010   during emergent CABG surgery; 2-3+ mitral regurgitation    No Known Allergies  Current Outpatient Medications  Medication Sig Dispense Refill   aspirin 81 MG tablet Take 81 mg by mouth daily.     Coenzyme Q10 300 MG CAPS Take by mouth.     ferrous  gluconate (FERGON) 240 (27 FE) MG tablet Take 240 mg by mouth daily.     hydrochlorothiazide (MICROZIDE) 12.5 MG capsule SMARTSIG:1 Capsule(s) By Mouth PRN     levothyroxine (SYNTHROID) 25 MCG tablet TAKE 0.5 TABLETS (12.5 MCG TOTAL) BY MOUTH DAILY. IN ADDITION TO THE 50 MCG. 45 tablet 1   levothyroxine (SYNTHROID) 50 MCG tablet TAKE 1 TABLET BY MOUTH EVERY DAY BEFORE BREAKFAST 90 tablet 0   metoprolol succinate (TOPROL-XL) 100 MG 24 hr tablet Take 50 mg by mouth daily. Take with or immediately following a meal.     Omega-3 Fatty Acids (FISH OIL) 1000 MG CAPS Take 2,000 mg by mouth daily.     rosuvastatin (CRESTOR) 40 MG tablet TAKE 1 TABLET BY MOUTH EVERY DAY 90 tablet 1   amLODipine (NORVASC) 5 MG tablet Take 5 mg by mouth daily.     No current facility-administered medications for this visit.    Also she is married has 3 children 5 grandchildren. She is an ex-smoker. She does not routinely exercise. There is no alcohol use.  ROS General: Negative; No fevers, chills, or night sweats; positive for purposeful weight loss HEENT: Negative; No changes in vision or hearing, sinus congestion, difficulty swallowing Pulmonary: Negative; No cough, wheezing, shortness of breath, hemoptysis Cardiovascular: See HPI; mild right leg swelling GI: Negative; No nausea, vomiting, diarrhea, or abdominal pain GU: Negative; No dysuria, hematuria, or difficulty voiding Musculoskeletal: Negative; no myalgias, joint pain, or weakness Hematologic/Oncology: Negative; no easy bruising, bleeding Endocrine: Positive for insulin resistance; no heat/cold intolerance; no diabetes Neuro: Negative; no changes in balance, headaches Skin: Negative; No rashes or skin lesions Psychiatric: Negative; No behavioral problems, depression Sleep: Negative; No snoring, daytime sleepiness, hypersomnolence, bruxism, restless legs, hypnogognic hallucinations, no cataplexy Other comprehensive 14 point system review is  negative.   PE BP 125/67   Pulse 72   Ht '5\' 4"'  (1.626 m)   Wt 213 lb 3.2 oz (96.7 kg)   SpO2 (!) 83%   BMI 36.60 kg/m    Repeat blood pressure 110/68  Wt Readings from Last 3 Encounters:  07/27/20 213 lb 3.2 oz (96.7 kg)  07/24/20 213 lb 9.6 oz (96.9 kg)  06/29/20 208 lb 12.8 oz (94.7 kg)   General: Alert, oriented, no distress.  Skin: normal turgor, no rashes, warm and dry HEENT: Normocephalic, atraumatic. Pupils equal round and reactive to light; sclera anicteric; extraocular muscles intact;  Nose without nasal septal hypertrophy Mouth/Parynx benign; Mallinpatti scale 3 Neck: No JVD, no carotid bruits; normal  carotid upstroke Lungs: clear to ausculatation and percussion; no wheezing or rales Chest wall: without tenderness to palpitation Heart: PMI not displaced, RRR, s1 s2 normal, 1/6 systolic murmur, no diastolic murmur, no rubs, gallops, thrills, or heaves Abdomen: soft, nontender; no hepatosplenomehaly, BS+; abdominal aorta nontender and not dilated by palpation. Back: no CVA tenderness Pulses 2+ Musculoskeletal: full range of motion, normal strength, no joint deformities Extremities: Trace right lower extremity edema; no clubbing cyanosis, Homan's sign negative  Neurologic: grossly nonfocal; Cranial nerves grossly wnl Psychologic: Normal mood and affect   ECG (independently read by me):  NSR at 72; PRWP; QTc 466 msec  February 2020 ECG (independently read by me): NSR at 74; QS V1-3, no ectopy, normal intervls  December 2020 ECG (independently read by me): Normal sinus rhythm at 74 bpm.  Poor R wave progression with QS complex V1 through V4.  No ectopy.  Normal intervals.  January 2020 ECG (independently read by me): Normal sinus rhythm at 73 bpm.  Anteroseptal QS complex  January 2019 ECG (independently read by me): normal sinus rhythm at 76 bpm.  Q wave in lead 3.  Nonspecific T changes.  October 2018 ECG (independently read by me): Normal sinus rhythm at 73 bpm.   Q wave in 3 and very small Q wave in aVF.  Nonspecific T changes anteriorly.  Normal intervals.  October 2017 ECG (independently read by me): Normal sinus rhythm at 65 bpm.  Nonspecific T changes.  Q wave in lead 3.  October 2016 ECG (independently read by me): Normal sinus rhythm at 77 bpm.  Nonspecific T changes anteriorly which are old.  Normal intervals.  September 2015 ECG (independently read by me): Normal sinus rhythm at 70 beats per minute.  Poor progression anteriorly.  Small Q-wave in lead 3.  Nonspecific T changes anteriorly, unchanged  04/16/2013 ECG (independently read by me): Normal sinus rhythm at 65 beats per minute. T wave changes V1 through V6, nonspecific, unchanged. QTc interval 455 ms  Prior ECG of October 15 2012: NSR @ 62 bpm with T-wave abnormality anteriorly V1 through V5 unchanged.  LABS: BMP Latest Ref Rng & Units 07/24/2020 06/29/2020 05/30/2020  Glucose 65 - 99 mg/dL 89 92 150(H)  BUN 8 - 27 mg/dL '13 16 14  ' Creatinine 0.57 - 1.00 mg/dL 1.06(H) 1.44(H) 0.83  BUN/Creat Ratio 12 - 28 12 - 17  Sodium 134 - 144 mmol/L 145(H) 143 143  Potassium 3.5 - 5.2 mmol/L 4.8 3.9 3.6  Chloride 96 - 106 mmol/L 101 97 95(L)  CO2 20 - 29 mmol/L 29 36(H) 32(H)  Calcium 8.7 - 10.3 mg/dL 10.0 10.1 8.9   Hepatic Function Latest Ref Rng & Units 07/24/2020 03/21/2020 09/13/2019  Total Protein 6.0 - 8.5 g/dL 7.6 7.1 6.9  Albumin 3.7 - 4.7 g/dL 4.4 4.2 4.2  AST 0 - 40 IU/L '24 21 21  ' ALT 0 - 32 IU/L '24 30 26  ' Alk Phosphatase 44 - 121 IU/L 83 72 71  Total Bilirubin 0.0 - 1.2 mg/dL <0.2 0.3 0.3   CBC Latest Ref Rng & Units 07/24/2020 06/29/2020 06/20/2020  WBC 3.4 - 10.8 x10E3/uL 8.9 12.4(H) 9.5  Hemoglobin 11.1 - 15.9 g/dL 14.0 12.7 13.0  Hematocrit 34.0 - 46.6 % 45.4 41.3 41.5  Platelets 150 - 450 x10E3/uL 262 298.0 431   Lab Results  Component Value Date   MCV 86 07/24/2020   MCV 89.4 06/29/2020   MCV 90 06/20/2020   Lab Results  Component Value Date  TSH 4.400 03/21/2020   Lab  Results  Component Value Date   HGBA1C 6.1 (H) 06/14/2019   Lipid Panel     Component Value Date/Time   CHOL 123 03/21/2020 0855   CHOL 114 07/22/2012 0840   TRIG 119 03/21/2020 0855   TRIG 170 (H) 07/22/2012 0840   HDL 46 03/21/2020 0855   HDL 42 07/22/2012 0840   CHOLHDL 2.7 03/21/2020 0855   CHOLHDL 3.2 06/26/2016 0859   VLDL 25 06/26/2016 0859   LDLCALC 56 03/21/2020 0855   LDLCALC 38 07/22/2012 0840      RADIOLOGY: No results found.  IMPRESSION:  1. Atrial fibrillation, unspecified type ([place])   2. Hx of CABG: 2012   3. Acquired hypothyroidism   4. Hyperlipidemia with target LDL less than 70   5. History of lung cancer   6. Moderate obesity     ASSESSMENT AND PLAN: Ms. Culton is a 75 year old Caucasian female who is 24 years following her initial anterior wall myocardial infarction with VF arrest for which she underwent PTCA emergently of her LAD in 1997. She is 10 years following emergency CABG surgery for life-threatening anatomy with 99% left main disease and total occlusion of her RCA at the ostium with left to right collaterals. Her ejection fraction did improve and is now 50-55% with small area of subtle LAD scar.  A follow-up myocardial perfusion study on 11/05/2016 continued to show normal perfusion without scar or ischemia .  She was fortuitously found to have cancer, stage Ia, non-small cell carcinoma and has been without recurrence.  When I saw her in February 2022 her blood pressure was stable on a regimen consisting of amlodipine 5 mg, and metoprolol succinate 100 mg.  She was not having any palpitations.  She continues to be on rosuvastatin 40 mg daily.  Lipid studies from March 21, 2020 were excellent with total cholesterol 123 and LDL cholesterol 56.  Return from [place], apparently she was hospitalized in [facility] with iron deficiency anemia, requiring 2 units of packed red blood cells.  She also went into atrial fibrillation for which she was  treated with amiodarone.  Her medications were adjusted.  Presently, her amiodarone was discontinued 3 days ago by her her primary provider she felt she was having side effects and hair loss.  She continues to be on levothyroxine 62.5 mcg daily.  Her metoprolol had been held but she restarted 50 mg.  Her blood pressure today is stable and I have recommended she continue her metoprolol succinate 50 mg however it may take 1 month of her amiodarone to wear off and if her resting pulse increases she may require further titration back to her prior dose of 100 mg daily.  She continues to have HCTZ to take on a as needed basis for leg swelling.  She will be leaving for [place] later today for 1 month.  She continues to be on aspirin.  I have recommended a follow-up echo Doppler study upon her return from [place] for reassessment of LV function.  I will see her in 3 months for reevaluation or sooner as needed.   Troy Sine, MD, [facility]  07/28/2020 4:15 PM

## 2020-07-27 NOTE — Telephone Encounter (Signed)
Patient states she is returning a call, but she is unsure who called or what the call was regarding.

## 2020-07-27 NOTE — Patient Instructions (Addendum)
Medication Instructions:  Per Dr. Claiborne Billings, patient to take 1/2 a 100mg  tablet of metoprolol succinate (50mg  daily) for the next month, in a month patient to re-evaluate heart rate and blood pressure.  *If you need a refill on your cardiac medications before your next appointment, please call your pharmacy*   Lab Work: None ordered.    Testing/Procedures: Your physician has requested that you have an echocardiogram. Echocardiography is a painless test that uses sound waves to create images of your heart. It provides your doctor with information about the size and shape of your heart and how well your heart's chambers and valves are working. This procedure takes approximately one hour. There are no restrictions for this procedure.   Follow-Up: At [facility], you and your health needs are our priority.  As part of our continuing mission to provide you with exceptional heart care, we have created designated Provider Care Teams.  These Care Teams include your primary Cardiologist (physician) and Advanced Practice Providers (APPs -  Physician Assistants and Nurse Practitioners) who all work together to provide you with the care you need, when you need it.  We recommend signing up for the patient portal called "MyChart".  Sign up information is provided on this After Visit Summary.  MyChart is used to connect with patients for Virtual Visits (Telemedicine).  Patients are able to view lab/test results, encounter notes, upcoming appointments, etc.  Non-urgent messages can be sent to your provider as well.   To learn more about what you can do with MyChart, go to [email or web address].    Your next appointment:   3 month(s)  The format for your next appointment:   In Person  Provider:   Shelva Majestic, MD

## 2020-07-28 ENCOUNTER — Encounter: Payer: Self-pay | Admitting: Cardiovascular Disease

## 2020-08-15 ENCOUNTER — Telehealth: Payer: Self-pay | Admitting: Nurse Practitioner

## 2020-08-15 DIAGNOSIS — R600 Localized edema: Secondary | ICD-10-CM

## 2020-08-15 MED ORDER — FUROSEMIDE 20 MG PO TABS: 20.0000 mg | ORAL_TABLET | Freq: Every day | ORAL | 0 refills | Status: DC

## 2020-08-15 NOTE — Addendum Note (Signed)
Addended by: Vivia Birmingham on: 08/15/2020 04:11 PM   Modules accepted: Orders

## 2020-08-15 NOTE — Telephone Encounter (Signed)
Per Helyn App d/c HCTZ 12.5 and start Furosemide 20mg  1 po QD. Pt to take BP daily and to have potassium level checked at next OV in August.   Patient is aware of the above and verbalized understanding. Furosemide sent to requested pharmacy.

## 2020-08-15 NOTE — Telephone Encounter (Signed)
Patient's legs are still swelling. Patient is curious about getting lasix instead of blood pressure medication. Please advise, thanks.   ([place]. Patient would like it sent to [facility] in [place].)

## 2020-08-21 ENCOUNTER — Other Ambulatory Visit: Payer: Self-pay | Admitting: Cardiovascular Disease

## 2020-08-25 DIAGNOSIS — J9601 Acute respiratory failure with hypoxia: Secondary | ICD-10-CM | POA: Diagnosis not present

## 2020-09-08 ENCOUNTER — Other Ambulatory Visit (HOSPITAL_COMMUNITY)
Admission: RE | Admit: 2020-09-08 | Discharge: 2020-09-08 | Disposition: A | Discharge status: Home / Self Care | Payer: Medicare HMO | Source: Ambulatory Visit | Attending: Internal Medicine | Admitting: Internal Medicine

## 2020-09-08 DIAGNOSIS — Z01812 Encounter for preprocedural laboratory examination: Secondary | ICD-10-CM | POA: Insufficient documentation

## 2020-09-08 DIAGNOSIS — Z20822 Contact with and (suspected) exposure to covid-19: Secondary | ICD-10-CM | POA: Diagnosis not present

## 2020-09-08 LAB — SARS CORONAVIRUS 2 (TAT 6-24 HRS): SARS Coronavirus 2: NEGATIVE

## 2020-09-11 ENCOUNTER — Ambulatory Visit (INDEPENDENT_AMBULATORY_CARE_PROVIDER_SITE_OTHER): Payer: Medicare HMO | Admitting: Internal Medicine

## 2020-09-11 ENCOUNTER — Ambulatory Visit: Payer: Medicare HMO | Admitting: Internal Medicine

## 2020-09-11 ENCOUNTER — Other Ambulatory Visit: Payer: Self-pay

## 2020-09-11 ENCOUNTER — Encounter: Payer: Self-pay | Admitting: Internal Medicine

## 2020-09-11 DIAGNOSIS — F17210 Nicotine dependence, cigarettes, uncomplicated: Secondary | ICD-10-CM | POA: Diagnosis not present

## 2020-09-11 DIAGNOSIS — R0609 Other forms of dyspnea: Secondary | ICD-10-CM

## 2020-09-11 DIAGNOSIS — J9612 Chronic respiratory failure with hypercapnia: Secondary | ICD-10-CM

## 2020-09-11 DIAGNOSIS — R69 Illness, unspecified: Secondary | ICD-10-CM | POA: Diagnosis not present

## 2020-09-11 DIAGNOSIS — J9611 Chronic respiratory failure with hypoxia: Secondary | ICD-10-CM

## 2020-09-11 DIAGNOSIS — R0600 Dyspnea, unspecified: Secondary | ICD-10-CM | POA: Diagnosis not present

## 2020-09-11 LAB — PULMONARY FUNCTION TEST
DL/VA % pred: 83 %
DL/VA: 3.45 ml/min/mmHg/L
DLCO cor % pred: 48 %
DLCO cor: 9.38 ml/min/mmHg
DLCO unc % pred: 49 %
DLCO unc: 9.54 ml/min/mmHg
FEF 25-75 Post: 1.02 L/sec
FEF 25-75 Pre: 1.16 L/sec
FEF2575-%Change-Post: -12 %
FEF2575-%Pred-Post: 57 %
FEF2575-%Pred-Pre: 65 %
FEV1-%Change-Post: -1 %
FEV1-%Pred-Post: 51 %
FEV1-%Pred-Pre: 52 %
FEV1-Post: 1.12 L
FEV1-Pre: 1.14 L
FEV1FVC-%Change-Post: -6 %
FEV1FVC-%Pred-Pre: 110 %
FEV6-%Change-Post: 5 %
FEV6-%Pred-Post: 52 %
FEV6-%Pred-Pre: 49 %
FEV6-Post: 1.44 L
FEV6-Pre: 1.37 L
FEV6FVC-%Change-Post: 0 %
FEV6FVC-%Pred-Post: 104 %
FEV6FVC-%Pred-Pre: 104 %
FVC-%Change-Post: 5 %
FVC-%Pred-Post: 50 %
FVC-%Pred-Pre: 47 %
FVC-Post: 1.44 L
FVC-Pre: 1.38 L
Post FEV1/FVC ratio: 78 %
Post FEV6/FVC ratio: 100 %
Pre FEV1/FVC ratio: 83 %
Pre FEV6/FVC Ratio: 100 %
RV % pred: 61 %
RV: 1.39 L
TLC % pred: 59 %
TLC: 3.02 L

## 2020-09-11 NOTE — Patient Instructions (Addendum)
Make sure you check your oxygen saturation  at your highest level of activity  to be sure it stays over 88% and adjust  02 flow upward to maintain this level if needed but remember to turn it back to previous settings when you stop (to conserve your supply).   Let me know if ADAP[employer] is not helpful with your travel plans or your equipment    Please schedule a follow up visit in 6  months but call sooner if needed

## 2020-09-11 NOTE — Assessment & Plan Note (Signed)
HC03   06/29/2020  = 36 -  06/29/2020   Walked RA  2 laps @ approx 281ft each @ relatively pace  stopped due to end of study,  no sob but sats 86%    Advised: Make sure you check your oxygen saturation  at your highest level of activity  to be sure it stays over 90% and adjust  02 flow upward to maintain this level if needed but remember to turn it back to previous settings when you stop (to conserve your supply).   Each maintenance medication was reviewed in detail including emphasizing most importantly the difference between maintenance and prns and under what circumstances the prns are to be triggered using an action plan format where appropriate.  Total time for H and P, chart review, counseling,  directly observing portions of ambulatory 02 saturation study/ and generating customized AVS unique to this office visit / same day charting > 30 min

## 2020-09-11 NOTE — Progress Notes (Signed)
PFT done today. 

## 2020-09-11 NOTE — Progress Notes (Signed)
[name], female    DOB: 1945/03/07   MRN: [id number]   Brief patient profile:  31 yowf active smoker s/p LULobectomy 2012 stage 1A [place] s adjuvant rx  temporarily quit smoking 03/21/20 with admit to [facility] / acute resp failure:   05/05/2020 Discharge Date:  05/22/2020 Consultants: Pulmonology, GI, wound care, cardiology  Principal Problem: Acute respiratory failure with hypoxia and hypercapnia Active Problems: Influenza A Type 2 MI (myocardial infarction) Metabolic encephalopathy Acute systolic congestive heart failure Coronary artery disease involving native coronary artery of native heart Dilated cardiomyopathy Acute blood loss anemia COPD with acute exacerbation Resolved Problems: Acute respiratory failure with hypercapnia  Hospital Course: [name] is a 75 y.o. female who presented with complaints of shortness of breath and altered mental status. Was found to have acute hypercapnic respiratory failure was placed on BiPAP in the ER. Patient had been tested positive for the flu. Patient required intubation and was admitted to ICU. Critical care was consulted. She was started on Tamiflu for influenza. Treatment for diabetes and hypothyroidism continued. She was found to have acute on chronic systolic heart failure as well. Patient has a cardiologist [place] where she was from where she will need to follow-up at discharge. Patient had came here on a trip to [place] from [place] when this started happening. She was found to be in atrial fibrillation and placed on amiodarone and beta-blocker temporarily. Though these were held briefly during the times when she had low blood pressures. Patient also was found to have anemia which required transfusion. Has anemia was found to be iron deficiency. GI saw the patient and felt endoscopic evaluation would be good when she is medically optimized. Patient was stabilized and transferred to the floor. Her mental status  had improved very slowly her oxygenation requirements have improved as well. She has history of lung cancer and lobectomy in the past and she continued to smoke prior to this admission. She is advised to stop smoking and she will need to follow-up with pulmonology outpatient. She has improved significantly now after prolonged course. At this point, she is on 3 L nasal cannula and has been set up for home O2. Patient would like to be discharged and will be discharged home in improved condition to follow-up closely with PCP cardiology and GI and pulmonology in the near future  Physical exam: No apparent distress awake alert and oriented x3 Regular rate and rhythm Chest clear to auscultation bilaterally Abdomen soft nontender nondistended  Pertinent Labs and Studies: Recent Labs  05/20/20 0520 05/21/20 0832 05/22/20 0302  WBC 18.0* 15.2* 16.4*  HGB 7.3* 7.6* 8.7*  HCT 25.0* 26.4* 29.4*  LABPLAT 444* 424* 394*  MCV 96.2* 96.4* 93.9  NEUTOPHILPCT 79.40 -- 82.00  LYMPHOPCT 9.90 -- 8.80  MONOPCT 8.30 -- 6.80  BASOPCT 0.20 -- 0.20  EOSPCT 0.10* -- 0.10*   Recent Labs  05/20/20 0520 05/21/20 0058  NA 141 141  K 3.4* 3.8  CL 99 99  CO2 >40* >40*  BUN 11 13  CREATININE 1.08* 1.24*  GLUCOSE 116* 164*  EGFRAA 59* 50*  EGFRNONAA 51* 43*  CALCIUM 8.4 8.5   No results for input(s): PROTIME, INR, PTT in the last 72 hours. No results for input(s): AST, ALT, BILITOT, BILIDIR, ALKPHOS, LIPASE, AMYLASE, ALBUMIN, AMMONIA in the last 72 hours. Recent Labs  05/20/20 0523 05/21/20 0058  BNP 123.2* 87.0     ID and Culture Results: Recent Labs  05/21/20 0058  GLUCOSE  164*    additional hx:  MI in 2012 and underwent emergent CABG.  She was noted to have a left upper lobe lung mass.  She had a left upper lobectomy a couple of months after her CABG.  The lung lesion turned out to be a stage Ia non-small cell carcinoma.  She did not require any adjuvant therapy         History of  Present Illness  06/29/2020  Pulmonary/ 1st office eval/[name]  Chief Complaint  Patient presents with   Pulmonary Consult     Referred by [name], NP. Pt with hx of left VATS- Dr Roxan Hockey 2012. She states hospitalized in March 2022 with PNA, influenza and UTI. She states remote hx of taking Amiodarone.   Dyspnea:  Strolling ok / can do [employer]/ no HC parking  Cough: none  Sleep: bed is flat / on side  SABA use: prn not using  Rec Make sure you check your oxygen saturation at your highest level of activity to be sure it stays over 90% and keep track of it at least once a week, more often if breathing getting worse, and let me know if losing ground.  Add:  V/q and venous doppler ordered 06/30/20 > neg   09/11/2020  f/u ov/[name] re:  doe/ 02 dep resp failure s/p Influenza A 04/2020 in [place] on way back from [place]  - has resumed smoking since last ov per husband though she downplays this Chief Complaint  Patient presents with   Follow-up    Patient is here for PFT review and does not have concerns with her breathing.  Dyspnea:  still does shopping at [employer] and [employer] not wearing 02  Cough: none  Sleeping: flat be / one pillow / ends up in recliner due to toss and turning  SABA use: not easing  02: sleeps on 3 lpm  Covid status:   no vaccination   No obvious day to day or daytime variability or assoc excess/ purulent sputum or mucus plugs or hemoptysis or cp or chest tightness, subjective wheeze or overt sinus or hb symptoms.   Sleeping  without nocturnal  or early am exacerbation  of respiratory  c/o's or need for noct saba. Also denies any obvious fluctuation of symptoms with weather or environmental changes or other aggravating or alleviating factors except as outlined above   No unusual exposure hx or h/o childhood pna/ asthma or knowledge of premature birth.  Current Allergies, Complete Past Medical History, Past Surgical History, Family History, and Social History were reviewed  in [employer] record.  ROS  The following are not active complaints unless bolded Hoarseness, sore throat, dysphagia, dental problems, itching, sneezing,  nasal congestion or discharge of excess mucus or purulent secretions, ear ache,   fever, chills, sweats, unintended wt loss or wt gain, classically pleuritic or exertional cp,  orthopnea pnd or arm/hand swelling  or leg swelling, presyncope, palpitations, abdominal pain, anorexia, nausea, vomiting, diarrhea  or change in bowel habits or change in bladder habits, change in stools or change in urine, dysuria, hematuria,  rash, arthralgias, visual complaints, headache, numbness, weakness or ataxia or problems with walking or coordination,  change in mood or  memory.        Current Meds  Medication Sig   aspirin 81 MG tablet Take 81 mg by mouth daily.   budesonide (PULMICORT) 0.5 MG/2ML nebulizer solution Inhale into the lungs as needed.   Coenzyme Q10 300 MG  CAPS Take by mouth.   ferrous gluconate (FERGON) 240 (27 FE) MG tablet Take 240 mg by mouth daily.   fluticasone (FLONASE) 50 MCG/ACT nasal spray Place 1 spray into both nostrils as needed.   furosemide (LASIX) 20 MG tablet Take 1 tablet (20 mg total) by mouth daily.   INCRUSE ELLIPTA 62.5 MCG/INH AEPB Inhale 1 puff into the lungs daily.   levothyroxine (SYNTHROID) 25 MCG tablet TAKE 0.5 TABLETS (12.5 MCG TOTAL) BY MOUTH DAILY. IN ADDITION TO THE 50 MCG.   levothyroxine (SYNTHROID) 50 MCG tablet TAKE 1 TABLET BY MOUTH EVERY DAY BEFORE BREAKFAST   metoprolol succinate (TOPROL-XL) 50 MG 24 hr tablet Take 50 mg by mouth daily. Take with or immediately following a meal.   Omega-3 Fatty Acids (FISH OIL) 1000 MG CAPS Take 2,000 mg by mouth daily.   rosuvastatin (CRESTOR) 40 MG tablet TAKE 1 TABLET BY MOUTH EVERY DAY             Past Medical History:  Diagnosis Date   CAD (coronary artery disease)    Cancer ([place])    Dyslipidemia    GERD (gastroesophageal reflux  disease)    HTN (hypertension)    Myocardial infarct ([facility])    Non-small cell lung cancer ([place]) 08/2010   Stage IA, status post left upper lobectomy July 2012   Tobacco abuse          Objective:       Wt Readings from Last 3 Encounters:  09/11/20 214 lb 12.8 oz (97.4 kg)  07/27/20 213 lb 3.2 oz (96.7 kg)  07/24/20 213 lb 9.6 oz (96.9 kg)      Vital signs reviewed  09/11/2020  - Note at rest 02 sats  89% on 3lpm    General appearance:    amb wf nad   HEENT : pt wearing mask not removed for exam due to covid -19 concerns.    NECK :  without JVD/Nodes/TM/ nl carotid upstrokes bilaterally   LUNGS: no acc muscle use,  Nl contour chest which is clear to A and P bilaterally without cough on insp or exp maneuvers   CV:  RRR  no s3 or murmur or increase in P2, and no edema   ABD:  soft and nontender with nl inspiratory excursion in the supine position. No bruits or organomegaly appreciated, bowel sounds nl  MS:  Nl gait/ ext warm without deformities, calf tenderness, cyanosis or clubbing No obvious joint restrictions   SKIN: warm and dry without lesions    NEURO:  alert, approp, nl sensorium with  no motor or cerebellar deficits apparent.                Assessment

## 2020-09-11 NOTE — Assessment & Plan Note (Signed)
Counseled re importance of smoking cessation but did not meet time criteria for separate billing   °

## 2020-09-11 NOTE — Assessment & Plan Note (Signed)
Onset March 2022 in setting of RAF/ anemia/ flu A -  06/29/2020   Walked RA  2 laps @ approx 264ft each @ fast  pace  stopped due to end of study, no sob  sats 86% with elevated d dimer  -  V/q low probability 07/03/2020  -  Venous dopplers 07/06/2020 BILATERAL: neg - PFT's  09/11/2020  FEV1 1.14 (52 % ) ratio 0.83  p 0 % improvement from saba p 0 prior to study with DLCO  9.54 (49%) corrects to 3.45 (82%)  for alv volume and FV curve nl  And ERV 40% at wt 215    Almost all of her problem is restrictive and on basis of prior LULectomy and obesity , not copd and not ILD at this  Point so main rx is 02 adjusted to keep sats > 90% and focusing on getting into neg calorie balance if at all possible but not need fo pulmonary medications

## 2020-09-13 ENCOUNTER — Encounter: Payer: Self-pay | Admitting: *Deleted

## 2020-09-19 ENCOUNTER — Telehealth: Payer: Self-pay | Admitting: Nurse Practitioner

## 2020-09-19 ENCOUNTER — Other Ambulatory Visit: Payer: Self-pay

## 2020-09-19 ENCOUNTER — Telehealth: Payer: Self-pay | Admitting: Internal Medicine

## 2020-09-19 DIAGNOSIS — J9611 Chronic respiratory failure with hypoxia: Secondary | ICD-10-CM

## 2020-09-19 DIAGNOSIS — R600 Localized edema: Secondary | ICD-10-CM

## 2020-09-19 MED ORDER — FUROSEMIDE 20 MG PO TABS: 20.0000 mg | ORAL_TABLET | Freq: Every day | ORAL | 1 refills | Status: DC

## 2020-09-19 NOTE — Telephone Encounter (Signed)
Patient would like a refill on her Lasix and would like it sent to [facility] in [place] on [address]. Patient only has two pills left, thanks.

## 2020-09-19 NOTE — Telephone Encounter (Signed)
Refill sent.

## 2020-09-19 NOTE — Telephone Encounter (Signed)
Called patient's husband but he did not answer. Left message for him to call us back tomorrow morning.

## 2020-09-20 ENCOUNTER — Other Ambulatory Visit: Payer: Self-pay | Admitting: *Deleted

## 2020-09-20 DIAGNOSIS — Z720 Tobacco use: Secondary | ICD-10-CM

## 2020-09-20 DIAGNOSIS — Z85118 Personal history of other malignant neoplasm of bronchus and lung: Secondary | ICD-10-CM

## 2020-09-21 NOTE — Telephone Encounter (Signed)
Called and spoke to pt. Pt is requesting another portable tank to have. Pt will be out of town until 8/27 and doesn't want the tank to be delivered until after then.    Dr. Melvyn Novas, please advise if ok to placed order for pt to have an additional O2 tank. Thanks.

## 2020-09-21 NOTE — Telephone Encounter (Signed)
Called and spoke with patient's husband. He verbalized understanding. He stated that she uses [facility] as her DME and uses 3L of O2.  Order has been placed and he is aware.   Nothing further needed at time of call.

## 2020-09-21 NOTE — Telephone Encounter (Signed)
Fine with me

## 2020-09-25 ENCOUNTER — Ambulatory Visit: Payer: Medicare HMO | Admitting: Nurse Practitioner

## 2020-09-25 DIAGNOSIS — J9601 Acute respiratory failure with hypoxia: Secondary | ICD-10-CM | POA: Diagnosis not present

## 2020-10-14 ENCOUNTER — Other Ambulatory Visit: Payer: Self-pay

## 2020-10-25 ENCOUNTER — Encounter: Payer: Self-pay | Admitting: Nurse Practitioner

## 2020-10-25 ENCOUNTER — Ambulatory Visit (INDEPENDENT_AMBULATORY_CARE_PROVIDER_SITE_OTHER): Payer: Medicare HMO | Admitting: Nurse Practitioner

## 2020-10-25 ENCOUNTER — Other Ambulatory Visit: Payer: Self-pay

## 2020-10-25 VITALS — BP 134/72 | HR 68 | Temp 98.3°F | Ht 64.5 in | Wt 212.4 lb

## 2020-10-25 DIAGNOSIS — E559 Vitamin D deficiency, unspecified: Secondary | ICD-10-CM

## 2020-10-25 DIAGNOSIS — I10 Essential (primary) hypertension: Secondary | ICD-10-CM | POA: Diagnosis not present

## 2020-10-25 DIAGNOSIS — E039 Hypothyroidism, unspecified: Secondary | ICD-10-CM

## 2020-10-25 DIAGNOSIS — D72829 Elevated white blood cell count, unspecified: Secondary | ICD-10-CM | POA: Diagnosis not present

## 2020-10-25 DIAGNOSIS — R600 Localized edema: Secondary | ICD-10-CM | POA: Diagnosis not present

## 2020-10-25 DIAGNOSIS — E782 Mixed hyperlipidemia: Secondary | ICD-10-CM | POA: Diagnosis not present

## 2020-10-25 DIAGNOSIS — R7301 Impaired fasting glucose: Secondary | ICD-10-CM | POA: Diagnosis not present

## 2020-10-25 DIAGNOSIS — Z6835 Body mass index (BMI) 35.0-35.9, adult: Secondary | ICD-10-CM | POA: Diagnosis not present

## 2020-10-25 DIAGNOSIS — Z0000 Encounter for general adult medical examination without abnormal findings: Secondary | ICD-10-CM

## 2020-10-25 MED ORDER — FUROSEMIDE 20 MG PO TABS: 20.0000 mg | ORAL_TABLET | Freq: Every day | ORAL | 1 refills | Status: DC

## 2020-10-25 NOTE — Progress Notes (Signed)
Established Patient Office Visit  Subjective:  Patient ID: [name], female    DOB: [birth year]  Age: 75 y.o. MRN: [id number]  CC:  Chief Complaint  Patient presents with   Hypertension   Edema   The patient states she has been doing very well since addition of furosemide 20 mg daily.  She does need new prescription for this today.  She does have history of hypothyroid and is due to have thyroid panel checked.  She currently taking 62.5 mg of levothyroxine every day.  We will refill correct dosage once lab results are available. She does continue to have shortness of breath especially with exertion since having flu and COVID earlier this year.  She is now seeing a pulmonologist.  She uses nasal cannula oxygen at night and as needed during the day.  She is gradually trying to wean herself off of oxygen.  She states that she has cut back her smoking.  She states that now takes her about 3 days to smoke a pack of cigarettes.  Hypertension This is a chronic problem. The current episode started more than 1 year ago. The problem is unchanged. The problem is controlled. Associated symptoms include headaches, orthopnea, peripheral edema and shortness of breath. Pertinent negatives include no chest pain or palpitations. Agents associated with hypertension include thyroid hormones. Risk factors for coronary artery disease include dyslipidemia, obesity and smoking/tobacco exposure. Past treatments include diuretics. The current treatment provides moderate improvement. COPD. Identifiable causes of hypertension include a thyroid problem.     Past Medical History:  Diagnosis Date   CAD (coronary artery disease)    Cancer (H[facility])    Dyslipidemia    GERD (gastroesophageal reflux disease)    HTN (hypertension)    Myocardial infarct (HCC)    Non-small cell lung cancer ([facility]) 08/2010   Stage IA, status post left upper lobectomy July 2012   Tobacco abuse     Past Surgical History:  Procedure  Laterality Date   CARDIAC CATHETERIZATION  07/06/2010   see CABG report - pt sent to OR   CARDIOVASCULAR STRESS TEST  09/11/2010   R/S MV - normal perfusion in all regions, EF 46%, no scintigraphic evidence of inducible myocardial ischemia; global LV systolic function mildly reduced; no significant wall motion abnormalities noted; Exercise capacity 7 METS; EKG negative for ischemia; low risk study, no signifcant change from previous study 08/2003   CORONARY ARTERY BYPASS GRAFT  07/11/2010   LIMA to LAD; SVG to 2nd branch OM; SVG to posterior descending artery   LEFT VATS  09/17/2010   TEE WITHOUT CARDIOVERSION  07/06/2010   during emergent CABG surgery; 2-3+ mitral regurgitation    Family History  Problem Relation Age of Onset   Hypothyroidism Father    Heart attack Father    Aplastic anemia Maternal Grandfather    Stroke Paternal Grandfather     Social History   Socioeconomic History   Marital status: Married    Spouse name: Not on file   Number of children: Not on file   Years of education: Not on file   Highest education level: Not on file  Occupational History   Not on file  Tobacco Use   Smoking status: Former    Packs/day: 1.00    Years: 58.00    Pack years: 58.00    Types: Cigarettes    Quit date: 03/21/2020    Years since quitting: 0.5   Smokeless tobacco: Never  Vaping Use   Vaping Use:  Never used  Substance and Sexual Activity   Alcohol use: No    Alcohol/week: 0.0 standard drinks   Drug use: No   Sexual activity: Never  Other Topics Concern   Not on file  Social History Narrative   Not on file   Social Determinants of Health   Financial Resource Strain: Not on file  Food Insecurity: Not on file  Transportation Needs: Not on file  Physical Activity: Not on file  Stress: Not on file  Social Connections: Not on file  Intimate Partner Violence: Not on file    Outpatient Medications Prior to Visit  Medication Sig Dispense Refill   aspirin 81 MG tablet  Take 81 mg by mouth daily.     budesonide (PULMICORT) 0.5 MG/2ML nebulizer solution Inhale into the lungs as needed.     Coenzyme Q10 300 MG CAPS Take by mouth.     ferrous gluconate (FERGON) 240 (27 FE) MG tablet Take 240 mg by mouth daily.     fluticasone (FLONASE) 50 MCG/ACT nasal spray Place 1 spray into both nostrils as needed.     INCRUSE ELLIPTA 62.5 MCG/INH AEPB Inhale 1 puff into the lungs daily.     levothyroxine (SYNTHROID) 25 MCG tablet TAKE 0.5 TABLETS (12.5 MCG TOTAL) BY MOUTH DAILY. IN ADDITION TO THE 50 MCG. 45 tablet 1   levothyroxine (SYNTHROID) 50 MCG tablet TAKE 1 TABLET BY MOUTH EVERY DAY BEFORE BREAKFAST 90 tablet 0   metoprolol succinate (TOPROL-XL) 50 MG 24 hr tablet Take 50 mg by mouth daily. Take with or immediately following a meal.     Omega-3 Fatty Acids (FISH OIL) 1000 MG CAPS Take 2,000 mg by mouth daily.     rosuvastatin (CRESTOR) 40 MG tablet TAKE 1 TABLET BY MOUTH EVERY DAY 90 tablet 1   furosemide (LASIX) 20 MG tablet Take 1 tablet (20 mg total) by mouth daily. 30 tablet 1   No facility-administered medications prior to visit.    No Known Allergies  ROS Review of Systems  Constitutional:  Positive for fatigue. Negative for activity change, appetite change, chills and fever.  HENT:  Positive for congestion. Negative for postnasal drip, rhinorrhea, sinus pressure, sinus pain, sneezing and sore throat.   Eyes: Negative.   Respiratory:  Positive for cough, shortness of breath and wheezing. Negative for chest tightness.   Cardiovascular:  Positive for orthopnea and leg swelling. Negative for chest pain and palpitations.        Doing much better now on furosemide 20 mg daily  Gastrointestinal:  Negative for abdominal pain, constipation, diarrhea, nausea and vomiting.  Endocrine: Negative for cold intolerance, heat intolerance, polydipsia and polyuria.       Patient due to her check of thyroid panel.  Genitourinary:  Negative for dyspareunia, dysuria, flank  pain, frequency and urgency.  Musculoskeletal:  Negative for arthralgias, back pain and myalgias.  Skin:  Negative for rash.  Allergic/Immunologic: Positive for environmental allergies.  Neurological:  Positive for headaches. Negative for dizziness and weakness.  Hematological:  Negative for adenopathy.  Psychiatric/Behavioral:  The patient is not nervous/anxious.      Objective:    Physical Exam Vitals and nursing note reviewed.  Constitutional:      Appearance: Normal appearance. She is well-developed. She is obese.  HENT:     Head: Normocephalic and atraumatic.     Nose: Nose normal.     Mouth/Throat:     Mouth: Mucous membranes are moist.  Eyes:     Extraocular  Movements: Extraocular movements intact.     Conjunctiva/sclera: Conjunctivae normal.     Pupils: Pupils are equal, round, and reactive to light.  Neck:     Vascular: No carotid bruit.  Cardiovascular:     Rate and Rhythm: Normal rate and regular rhythm.     Pulses: Normal pulses.     Heart sounds: Murmur heard.     Comments: Soft, blowing, systolic murmur can be heard today. Pulmonary:     Effort: Pulmonary effort is normal.     Breath sounds: Normal breath sounds.     Comments: There is some high-pitched, expiratory wheezing heard throughout the lung fields. Abdominal:     Palpations: Abdomen is soft.     Tenderness: There is no abdominal tenderness.  Musculoskeletal:        General: Normal range of motion.     Cervical back: Normal range of motion and neck supple.  Lymphadenopathy:     Cervical: No cervical adenopathy.  Skin:    General: Skin is warm and dry.     Capillary Refill: Capillary refill takes less than 2 seconds.  Neurological:     General: No focal deficit present.     Mental Status: She is alert and oriented to person, place, and time.  Psychiatric:        Mood and Affect: Mood normal.        Behavior: Behavior normal.        Thought Content: Thought content normal.        Judgment:  Judgment normal.    Today's Vitals   10/25/20 0858  BP: 134/72  Pulse: 68  Temp: 98.3 F (36.8 C)  SpO2: (!) 84%  Weight: 212 lb 6.4 oz (96.3 kg)  Height: 5' 4.5" (1.638 m)   Body mass index is 35.9 kg/m.   Wt Readings from Last 3 Encounters:  10/25/20 212 lb 6.4 oz (96.3 kg)  09/11/20 214 lb 12.8 oz (97.4 kg)  07/27/20 213 lb 3.2 oz (96.7 kg)     Health Maintenance Due  Topic Date Due   COVID-19 Vaccine (1) Never done   Hepatitis C Screening  Never done   Zoster Vaccines- Shingrix (2 of 2) 08/09/2019   PNA vac Low Risk Adult (2 of 2 - PCV13) 01/28/2020   INFLUENZA VACCINE  Never done    There are no preventive care reminders to display for this patient.  Lab Results  Component Value Date   TSH 4.400 03/21/2020   Lab Results  Component Value Date   WBC 8.9 07/24/2020   HGB 14.0 07/24/2020   HCT 45.4 07/24/2020   MCV 86 07/24/2020   PLT 262 07/24/2020   Lab Results  Component Value Date   NA 145 (H) 07/24/2020   K 4.8 07/24/2020   CO2 29 07/24/2020   GLUCOSE 89 07/24/2020   BUN 13 07/24/2020   CREATININE 1.06 (H) 07/24/2020   BILITOT <0.2 07/24/2020   ALKPHOS 83 07/24/2020   AST 24 07/24/2020   ALT 24 07/24/2020   PROT 7.6 07/24/2020   ALBUMIN 4.4 07/24/2020   CALCIUM 10.0 07/24/2020   EGFR 55 (L) 07/24/2020   GFR 36.05 (L) 06/29/2020   Lab Results  Component Value Date   CHOL 123 03/21/2020   Lab Results  Component Value Date   HDL 46 03/21/2020   Lab Results  Component Value Date   LDLCALC 56 03/21/2020   Lab Results  Component Value Date   TRIG 119 03/21/2020   Lab  Results  Component Value Date   CHOLHDL 2.7 03/21/2020   Lab Results  Component Value Date   HGBA1C 6.1 (H) 06/14/2019      Assessment & Plan:  1. Hypertension, unspecified type Blood pressure well managed with current medications.  Continue as prescribed.  Check routine, fasting blood work in the office today. - CBC with Differential/Platelet - Comprehensive  metabolic panel - T4, free  2. Lower extremity edema May continue furosemide 20 mg tablets daily when needed.  A 90-day prescription was sent to her pharmacy.  Check CMP and thyroid panel for further evaluation. - Comprehensive metabolic panel - T4, free - TSH - furosemide (LASIX) 20 MG tablet; Take 1 tablet (20 mg total) by mouth daily.  Dispense: 90 tablet; Refill: 1  3. Acquired hypothyroidism Check thyroid panel today.  Adjust levothyroxine dosing as indicated based on thyroid results.  New prescription to be sent to pharmacy after her thyroid results are back. - T4, free - TSH  4. Mixed hyperlipidemia Check fasting lipid panel today.  Adjust dosing of rosuvastatin as indicated. - Lipid panel  5. Impaired fasting glucose Check hemoglobin A1c along with routine fasting labs. - Hemoglobin A1c  6. Vitamin D deficiency Check vitamin D level today and treat deficiency as indicated. - Vitamin D 1,25 dihydroxy - Vitamin D (25 hydroxy)  7. Leukocytosis, unspecified type Check CBC - CBC with Differential/Platelet   Problem List Items Addressed This Visit       Cardiovascular and Mediastinum   HTN (hypertension) - Primary (Chronic)   Relevant Medications   furosemide (LASIX) 20 MG tablet   Other Relevant Orders   CBC with Differential/Platelet   Comprehensive metabolic panel   T4, free     Endocrine   Acquired hypothyroidism   Relevant Orders   T4, free   TSH   Impaired fasting glucose   Relevant Orders   Hemoglobin A1c     Other   Lower extremity edema   Relevant Medications   furosemide (LASIX) 20 MG tablet   Other Relevant Orders   Comprehensive metabolic panel   T4, free   TSH   Mixed hyperlipidemia   Relevant Medications   furosemide (LASIX) 20 MG tablet   Other Relevant Orders   Lipid panel   Vitamin D deficiency   Relevant Orders   Vitamin D 1,25 dihydroxy   Vitamin D (25 hydroxy)   Leukocytosis   Relevant Orders   CBC with  Differential/Platelet   Comprehensive metabolic panel   T4, free   Other Visit Diagnoses     Healthcare maintenance       Relevant Orders   CBC with Differential/Platelet   Comprehensive metabolic panel   T4, free       Meds ordered this encounter  Medications   furosemide (LASIX) 20 MG tablet    Sig: Take 1 tablet (20 mg total) by mouth daily.    Dispense:  90 tablet    Refill:  1    Order Specific Question:   Supervising Provider    Answer:   Beatrice Lecher D [[id number]]   This note was dictated using Dragon Voice Recognition Software. Rapid proofreading was performed to expedite the delivery of the information. Despite proofreading, phonetic errors will occur which are common with this voice recognition software. Please take this into consideration. If there are any concerns, please contact our office.    Follow-up: Return in about 4 months (around 02/24/2021) for medicare wellness.    [name],  NP

## 2020-10-25 NOTE — Patient Instructions (Signed)

## 2020-10-26 DIAGNOSIS — J9601 Acute respiratory failure with hypoxia: Secondary | ICD-10-CM | POA: Diagnosis not present

## 2020-10-26 LAB — COMPREHENSIVE METABOLIC PANEL
ALT: 52 IU/L — ABNORMAL HIGH (ref 0–32)
AST: 47 IU/L — ABNORMAL HIGH (ref 0–40)
Albumin/Globulin Ratio: 1.5 (ref 1.2–2.2)
Albumin: 4.1 g/dL (ref 3.7–4.7)
Alkaline Phosphatase: 78 IU/L (ref 44–121)
BUN/Creatinine Ratio: 15 (ref 12–28)
BUN: 11 mg/dL (ref 8–27)
Bilirubin Total: 0.3 mg/dL (ref 0.0–1.2)
CO2: 36 mmol/L — ABNORMAL HIGH (ref 20–29)
Calcium: 9.8 mg/dL (ref 8.7–10.3)
Chloride: 100 mmol/L (ref 96–106)
Creatinine, Ser: 0.72 mg/dL (ref 0.57–1.00)
Globulin, Total: 2.7 g/dL (ref 1.5–4.5)
Glucose: 97 mg/dL (ref 65–99)
Potassium: 4.8 mmol/L (ref 3.5–5.2)
Sodium: 147 mmol/L — ABNORMAL HIGH (ref 134–144)
Total Protein: 6.8 g/dL (ref 6.0–8.5)
eGFR: 88 mL/min/{1.73_m2} (ref >59)

## 2020-10-26 LAB — CBC WITH DIFFERENTIAL/PLATELET
Basophils Absolute: 0.1 10*3/uL (ref 0.0–0.2)
Basos: 1 %
EOS (ABSOLUTE): 0.1 10*3/uL (ref 0.0–0.4)
Eos: 2 %
Hematocrit: 44 % (ref 34.0–46.6)
Hemoglobin: 13.4 g/dL (ref 11.1–15.9)
Immature Grans (Abs): 0 10*3/uL (ref 0.0–0.1)
Immature Granulocytes: 0 %
Lymphocytes Absolute: 2.2 10*3/uL (ref 0.7–3.1)
Lymphs: 26 %
MCH: 26.2 pg — ABNORMAL LOW (ref 26.6–33.0)
MCHC: 30.5 g/dL — ABNORMAL LOW (ref 31.5–35.7)
MCV: 86 fL (ref 79–97)
Monocytes Absolute: 0.8 10*3/uL (ref 0.1–0.9)
Monocytes: 10 %
Neutrophils Absolute: 5.2 10*3/uL (ref 1.4–7.0)
Neutrophils: 61 %
Platelets: 250 10*3/uL (ref 150–450)
RBC: 5.11 x10E6/uL (ref 3.77–5.28)
RDW: 16.5 % — ABNORMAL HIGH (ref 11.7–15.4)
WBC: 8.4 10*3/uL (ref 3.4–10.8)

## 2020-10-26 LAB — VITAMIN D 25 HYDROXY (VIT D DEFICIENCY, FRACTURES): Vit D, 25-Hydroxy: 42.2 ng/mL (ref 30.0–100.0)

## 2020-10-26 LAB — TSH: TSH: 4.65 u[IU]/mL — ABNORMAL HIGH (ref 0.450–4.500)

## 2020-10-26 LAB — HEMOGLOBIN A1C
Est. average glucose Bld gHb Est-mCnc: 131 mg/dL
Hgb A1c MFr Bld: 6.2 % — ABNORMAL HIGH (ref 4.8–5.6)

## 2020-10-26 LAB — LIPID PANEL
Chol/HDL Ratio: 2.4 ratio (ref 0.0–4.4)
Cholesterol, Total: 112 mg/dL (ref 100–199)
HDL: 47 mg/dL (ref >39)
LDL Chol Calc (NIH): 44 mg/dL (ref 0–99)
Triglycerides: 114 mg/dL (ref 0–149)
VLDL Cholesterol Cal: 21 mg/dL (ref 5–40)

## 2020-10-26 LAB — T4, FREE: Free T4: 1.28 ng/dL (ref 0.82–1.77)

## 2020-11-03 ENCOUNTER — Ambulatory Visit
Admission: RE | Admit: 2020-11-03 | Discharge: 2020-11-03 | Disposition: A | Discharge status: Home / Self Care | Payer: Medicare HMO | Source: Ambulatory Visit | Attending: Thoracic Surgery (Cardiothoracic Vascular Surgery) | Admitting: Thoracic Surgery (Cardiothoracic Vascular Surgery)

## 2020-11-03 DIAGNOSIS — J439 Emphysema, unspecified: Secondary | ICD-10-CM | POA: Diagnosis not present

## 2020-11-03 DIAGNOSIS — Z720 Tobacco use: Secondary | ICD-10-CM

## 2020-11-03 DIAGNOSIS — Z85118 Personal history of other malignant neoplasm of bronchus and lung: Secondary | ICD-10-CM

## 2020-11-03 DIAGNOSIS — R911 Solitary pulmonary nodule: Secondary | ICD-10-CM | POA: Diagnosis not present

## 2020-11-03 DIAGNOSIS — C3490 Malignant neoplasm of unspecified part of unspecified bronchus or lung: Secondary | ICD-10-CM | POA: Diagnosis not present

## 2020-11-03 DIAGNOSIS — I700 Atherosclerosis of aorta: Secondary | ICD-10-CM | POA: Diagnosis not present

## 2020-11-07 ENCOUNTER — Encounter: Payer: Medicare HMO | Admitting: Thoracic Surgery (Cardiothoracic Vascular Surgery)

## 2020-11-09 ENCOUNTER — Encounter: Payer: Self-pay | Admitting: Nurse Practitioner

## 2020-11-09 NOTE — Progress Notes (Signed)
Liver functions a little elevated. Will recheck when seen again. Other labs stable. From prior checks. MyChart message sent to patient.

## 2020-11-13 ENCOUNTER — Encounter: Payer: Medicare HMO | Admitting: Thoracic Surgery (Cardiothoracic Vascular Surgery)

## 2020-11-14 ENCOUNTER — Other Ambulatory Visit: Payer: Self-pay

## 2020-11-14 ENCOUNTER — Ambulatory Visit (HOSPITAL_COMMUNITY): Payer: Medicare HMO | Attending: Cardiovascular Disease

## 2020-11-14 ENCOUNTER — Encounter: Payer: Medicare HMO | Admitting: Thoracic Surgery (Cardiothoracic Vascular Surgery)

## 2020-11-14 DIAGNOSIS — I4891 Unspecified atrial fibrillation: Secondary | ICD-10-CM | POA: Diagnosis not present

## 2020-11-14 LAB — ECHOCARDIOGRAM COMPLETE
Area-P 1/2: 3.27 cm2
S' Lateral: 3.7 cm

## 2020-11-16 ENCOUNTER — Other Ambulatory Visit: Payer: Self-pay

## 2020-11-16 ENCOUNTER — Ambulatory Visit: Payer: Medicare HMO | Admitting: Thoracic Surgery (Cardiothoracic Vascular Surgery)

## 2020-11-16 ENCOUNTER — Encounter: Payer: Self-pay | Admitting: Thoracic Surgery (Cardiothoracic Vascular Surgery)

## 2020-11-16 VITALS — BP 140/80 | HR 82 | Resp 20 | Ht 64.5 in | Wt 212.0 lb

## 2020-11-16 DIAGNOSIS — Z85118 Personal history of other malignant neoplasm of bronchus and lung: Secondary | ICD-10-CM

## 2020-11-16 NOTE — Progress Notes (Signed)
[address]       [place] [address]             [phone]     HPI: [name] returns for an annual follow-up visit  [name] is a 75 year old woman with a history of CAD, MI, CABG, stage Ia non-small cell carcinoma of the lung, left upper lobectomy, hypertension, hyperlipidemia, reflux, and ongoing tobacco abuse.  She presented with an MI in 2012 and underwent emergent coronary bypass grafting.  She was noted to have a left upper lobe mass.  After she recovered from her coronary bypass grafting she underwent a left upper lobectomy.  It was a stage Ia non-small cell carcinoma.  She continues to smoke.  She is currently smoking about 1 pack every 3 days.  I last saw her a year ago.  In March she was hospitalized in [place] with pneumonia and influenza.  She had a rough time recovering from that.  She recently was started on diuretics.  She does not use her oxygen all the time.  She says her O2 saturations are rarely above 88%.  She does have shortness of breath with relatively light exertion.  No chest pain, pressure, or tightness.  Past Medical History:  Diagnosis Date   CAD (coronary artery disease)    Cancer ([facility])    Dyslipidemia    GERD (gastroesophageal reflux disease)    HTN (hypertension)    Myocardial infarct ([facility])    Non-small cell lung cancer ([facility]) 08/2010   Stage IA, status post left upper lobectomy July 2012   Tobacco abuse     Current Outpatient Medications  Medication Sig Dispense Refill   aspirin 81 MG tablet Take 81 mg by mouth daily.     budesonide (PULMICORT) 0.5 MG/2ML nebulizer solution Inhale into the lungs as needed.     Coenzyme Q10 300 MG CAPS Take by mouth.     ferrous gluconate (FERGON) 240 (27 FE) MG tablet Take 240 mg by mouth daily.     fluticasone (FLONASE) 50 MCG/ACT nasal spray Place 1 spray into both nostrils as needed.     furosemide (LASIX) 20 MG tablet Take 1 tablet (20 mg total) by mouth daily. 90 tablet 1    INCRUSE ELLIPTA 62.5 MCG/INH AEPB Inhale 1 puff into the lungs daily.     levothyroxine (SYNTHROID) 25 MCG tablet TAKE 0.5 TABLETS (12.5 MCG TOTAL) BY MOUTH DAILY. IN ADDITION TO THE 50 MCG. 45 tablet 1   levothyroxine (SYNTHROID) 50 MCG tablet TAKE 1 TABLET BY MOUTH EVERY DAY BEFORE BREAKFAST 90 tablet 0   metoprolol succinate (TOPROL-XL) 50 MG 24 hr tablet Take 50 mg by mouth daily. Take with or immediately following a meal.     Omega-3 Fatty Acids (FISH OIL) 1000 MG CAPS Take 2,000 mg by mouth daily.     rosuvastatin (CRESTOR) 40 MG tablet TAKE 1 TABLET BY MOUTH EVERY DAY 90 tablet 1   No current facility-administered medications for this visit.    Physical Exam BP 140/80   Pulse 82   Resp 20   Ht 5' 4.5" (1.638 m)   Wt 212 lb (96.2 kg)   SpO2 90% Comment: RA  BMI 35.58 kg/m  75 year old woman in no acute distress Alert and oriented x3 with no focal deficits Lungs diminished breath sounds bilaterally, no wheezing Cardiac regular rate and rhythm, no murmur No carotid bruits No cervical or supraclavicular adenopathy  Diagnostic Tests: CT CHEST WITHOUT CONTRAST  TECHNIQUE: Multidetector CT imaging of the chest was performed following the standard protocol without IV contrast.   COMPARISON:  10/26/2019   FINDINGS: Cardiovascular: The heart size is normal. No substantial pericardial effusion. Coronary artery calcification is evident. Status post CABG. Mild atherosclerotic calcification is noted in the wall of the thoracic aorta.   Mediastinum/Nodes: Scattered small mediastinal lymph nodes are stable in the interval without lymphadenopathy by CT size criteria. No evidence for gross hilar lymphadenopathy although assessment is limited by the lack of intravenous contrast on the current study. The esophagus has normal imaging features. There is no axillary lymphadenopathy.   Lungs/Pleura: Centrilobular and paraseptal emphysema evident. 5 mm perifissural nodule in the  right middle lobe (64/8) is again noted to be stable, consistent with benign etiology. Volume loss again noted left hemithorax.   A new 9 mm nodular subpleural lesion identified in the left lung (43/8) with stable bandlike scarring in the parahilar left lung. No focal airspace consolidation. No pleural effusion.   Upper Abdomen: Unremarkable.   Musculoskeletal: No worrisome lytic or sclerotic osseous abnormality.   IMPRESSION: 1. New nodular 9 mm subpleural lesion in the left lung. Recurrent disease not excluded and close attention recommended. PET-CT may prove helpful to further evaluate as clinically warranted. 2. Stable 5 mm perifissural nodule in the right middle lobe, consistent with benign etiology. 3. Emphysema (ICD10-J43.9) and Aortic Atherosclerosis (ICD10-170.0)     Electronically Signed   By: [name] M.D.   On: 11/03/2020 12:18 I personally reviewed the CT images.  There is a new 9 mm subpleural nodule in the left lower lobe.  Impression: [name] is a 75 year old woman with a history of CAD, MI, CABG, stage Ia non-small cell carcinoma of the lung, left upper lobectomy, hypertension, hyperlipidemia, reflux, and ongoing tobacco abuse.   Tobacco abuse- although chart indicated she quit in February, she is smoking about 1 pack of cigarettes every 3 days.  New lung nodule-she has a new 9 mm subpleural nodule in the left lower lobe.  Very concerning for possible new primary bronchogenic carcinoma.  Also in the differential is nodule leftover from her pneumonia in [place] and other infectious or inflammatory nodules.  I recommended a short-term interval follow-up with a PET/CT in a month.  That will be 6 weeks from her previous scan.  I do not think she would be a surgical candidate.  But could be treated with stereotactic radiation.  CAD-status post CABG in 2012.  No anginal symptoms.  Recent echo showed ejection fraction of 50 to 55% with no significant  valvular disease.  There was grade 1 diastolic dysfunction.  Plan: Return in 1 month with PET/CT to follow-up new left lung nodule  Melrose Nakayama, MD [facility] [phone]

## 2020-11-17 ENCOUNTER — Other Ambulatory Visit: Payer: Self-pay | Admitting: Thoracic Surgery (Cardiothoracic Vascular Surgery)

## 2020-11-17 DIAGNOSIS — Z902 Acquired absence of lung [part of]: Secondary | ICD-10-CM

## 2020-11-23 ENCOUNTER — Other Ambulatory Visit: Payer: Self-pay | Admitting: Cardiovascular Disease

## 2020-11-23 DIAGNOSIS — E039 Hypothyroidism, unspecified: Secondary | ICD-10-CM

## 2020-11-25 DIAGNOSIS — J9601 Acute respiratory failure with hypoxia: Secondary | ICD-10-CM | POA: Diagnosis not present

## 2020-11-29 ENCOUNTER — Other Ambulatory Visit: Payer: Self-pay

## 2020-11-29 ENCOUNTER — Ambulatory Visit: Payer: Medicare HMO | Admitting: Cardiovascular Disease

## 2020-11-29 ENCOUNTER — Encounter: Payer: Self-pay | Admitting: Cardiovascular Disease

## 2020-11-29 DIAGNOSIS — Z720 Tobacco use: Secondary | ICD-10-CM

## 2020-11-29 DIAGNOSIS — I480 Paroxysmal atrial fibrillation: Secondary | ICD-10-CM | POA: Diagnosis not present

## 2020-11-29 DIAGNOSIS — Z951 Presence of aortocoronary bypass graft: Secondary | ICD-10-CM

## 2020-11-29 DIAGNOSIS — J439 Emphysema, unspecified: Secondary | ICD-10-CM | POA: Diagnosis not present

## 2020-11-29 DIAGNOSIS — I2510 Atherosclerotic heart disease of native coronary artery without angina pectoris: Secondary | ICD-10-CM | POA: Diagnosis not present

## 2020-11-29 DIAGNOSIS — E668 Other obesity: Secondary | ICD-10-CM | POA: Diagnosis not present

## 2020-11-29 DIAGNOSIS — I2583 Coronary atherosclerosis due to lipid rich plaque: Secondary | ICD-10-CM | POA: Diagnosis not present

## 2020-11-29 DIAGNOSIS — E785 Hyperlipidemia, unspecified: Secondary | ICD-10-CM | POA: Diagnosis not present

## 2020-11-29 DIAGNOSIS — Z85118 Personal history of other malignant neoplasm of bronchus and lung: Secondary | ICD-10-CM | POA: Diagnosis not present

## 2020-11-29 MED ORDER — ROSUVASTATIN CALCIUM 40 MG PO TABS: 40.0000 mg | ORAL_TABLET | Freq: Every day | ORAL | 3 refills | Status: DC

## 2020-11-29 NOTE — Progress Notes (Signed)
Patient ID: [name], female   DOB: 04/29/1945, 75 y.o.   MRN: [id number] n       HPI: [name] is a 75 y.o. female who presents to the office today for a 6 month follow up cardiology evaluation.  Ms. Slee suffered initial anterior wall myocardial infarction with ventricular fibrillation arrest requiring resuscitation and PTCA of her LAD in 1997. On 07/05/2010 she presented with increasing shortness of breath and was found to have critical life threatening anatomy with 99% left main equivalent disease.   Later that afternoon she underwent emergent CABG revascularization surgery by Dr. Roxan Hockey with a LIMA to the LAD, a vein graft to the second branch of the obtuse marginal vessel, and vein graft to the PDA.  Chest x-rays disclosed a left upper lung nodule and ultimately this proved to be adenocarcinoma for which she ultimately underwent upper lobe resection in July 2012. Additional problems include hypertension, hyperlipidemia, obesity and probable metabolic syndrome. A nuclear perfusion study in July 2012 showed a post stress ejection fraction of 46% but otherwise fairly normal perfusion. EF at cath was 35% prior to surgery.   In December 2013 an NMR lipoprofile revealed that despite an LDL of 50 she had increased LDL particle #1312, and increased small LDL particle number at 761. Her insulin resistance was significantly elevated at 70. HDL particle number was low at 26.8 and a triglyceride of 152.evaluation.  An echo Doppler study on 08/04/2012 showed an ejection fraction at 50 - 55%. She had mild LVH. There was grade 1 diastolic dysfunction with distal anterior, anteroseptal and apical LAD scar. There is mild tricuspid regurgitation and mild mitral regurgitation. PA pressure estimated 32 mm.  A LexiScan study in 07/2012 was low risk. Ejection fraction was 56%. There is no evidence for ischemia. She had laboratory which showed an LDL particle number at 830 with a target LDL of 38  HDL 42 triglycerides 170. Her insulin resistance was elevated at 60. Hemoglobin A1c was 5.7.  When I last saw her in October 2017 she was still smoking one pack of cigarettes every 3 day.  She sees Dr. Roxan Hockey on a yearly basis and  saw him in August 2017..  A follow-up chest CT was reviewed and she was stable without evidence of local recurrence or metastatic disease status post left upper lobectomy for stage IA non-small cell carcinoma.  Over the past year, she denies any episodes of recurrent chest pain or awareness of palpitations.  She underwent a nuclear perfusion study on 11/05/2016, which revealed normal perfusion and function without scar or ischemia. She saw Dr. Roxan Hockey in August 2018 and during that evaluation, she was significantly hypertensive with a blood pressure 170/87.  She was on Toprol-XL 100 mg, rosuvastatin 40 mg, and levothyroxine 50 g.  Unfortunately she still smokes and is smoking anywhere from 1-3/4 of a pack daily.  She had laboratory in May and on one chemistry evaluation.  Her potassium was 5.5, a follow-up evaluation showed normal potassium.  Cholesterol was 123, HDL 39, and LDL 59.    When I saw her in October 2018, I initiated amlodipine 5 mg.  She was undergoing low dose CT imaging for CA.  We again discussed complete smoking cessation.    I saw her in January 2019. Over the prior year she hadshe has been going back and forth between [place] and [place].    She denies any significant shortness of breath.  She has not been very active.  Unfortunately she continues to smoke cigarettes and typically a pack of cigarettes may last 2 to 3 days. She has seen Dr. Roxan Hockey for cardiothoracic surgery follow-up in September 2019.  Laboratory in December 2019 showed an LDL cholesterol at 44, HDL 41, total cholesterol 114 and triglycerides 146.  She continued to be on amlodipine 5 mg and Toprol-XL 100 mg for hypertension and her CAD and  rosuvastatin 40 mg for  hyperlipidemia in addition to omega-3 fatty acid.  She has hypothyroidism on levothyroxine.  She continues to be on daily aspirin.    She was evaluated in January 2020 at which time she remained stable from a cardiac standpoint.  She had seen Dr. Roxan Hockey in follow-up.  She sees Dr. Raliegh Scarlet for primary care.  She just completed lab work on January 22, 2019.  Lipid studies were excellent with total cholesterol 115, triglycerides 116, HDL 41, and LDL 53.  Renal function was stable with a BUN of 12 and creatinine 0.79.  TSH was borderline increased at 4.68.  CBC was stable.  She remains asymptomatic with reference to chest pain or shortness of breath.  She has not been successful with weight loss.    I saw her in a telemedicine evaluation in June 2021.  Unfortunately she was still smoking cigarettes.  She underwent a follow-up echo Doppler study on July 27, 2019 which showed an EF of 50 to 55% with septal hypokinesis consistent with her prior CABG revascularization.  There was mild asymmetric LVH and grade 2 diastolic dysfunction.  Tissue Doppler was abnormal with elevated left ventricular end-diastolic pressure.  She was without recurrent anginal symptoms.  I saw her in February 2022.  At that time she can continue to smoke cigarettes with a pack lasting approximately 1-1/2 days.  She had no intention to quit.  She had seen Dr. Roxan Hockey for follow-up evaluation.  Recent laboratory had showed a potassium of 5.6 and she was drinking significant amount of orange juice.  She denied palpitations.  Was having swelling and I recommended HCTZ 12.5 mg to take for swelling and then changed to on an as-needed basis.  I recommended follow-up laboratory for her potassium.  Following that evaluation, she drove to [place].  Apparently on her way home she became ill and was hospitalized in [facility] for 2 weeks.  Apparently she required 2 units of packed red blood cells.  During her hospitalization, he apparently  went into atrial fibrillation and was placed on amiodarone and beta-blocker temporarily.  He is were held at times during low blood pressures.  She was found to have iron deficiency edema.  Her medications were adjusted.  Upon return she was evaluated by Dr. Christinia Gully her pulmonologist.  She stopped smoking in February 2022.  She recently saw her primary provider, Lorrene Reid, PAC.  Amiodarone was discontinued on July 24, 2020.  I last saw her on July 27, 2020. At that time, she felt well and denied any chest pain.  She was unaware of any recurrent atrial fibrillation.    Since I saw her, she was seen by Dr. Modesto Charon for an annual follow-up visit.  A recent CT scan showed a new 9 mm nodular subpleural lesion in the left lower lobe.  There is concern for possible new primary bronchogenic carcinoma but also in the differential is a nodule leftover from her recent pneumonia in [place] or other infectious or inflammatory nodules.  As result, a short-term interval follow-up with a PET/CT scan has been  ordered and is scheduled for December 21, 2020 with follow-up with Dr. Roxan Hockey on December 26, 2020.  The patient admits that she has continued to smoke cigarettes and still smokes 1 pack of cigarettes every 3 days.  She states at home she has supplemental oxygen and uses it to sleep.  She rarely notes her oxygen saturation greater than 88%.  She has underlying COPD and emphysema.  She is unaware of any chest tightness but admits to exertional dyspnea.  She presents for cardiology follow-up evaluation.   Past Medical History:  Diagnosis Date   CAD (coronary artery disease)    Cancer ([place])    Dyslipidemia    GERD (gastroesophageal reflux disease)    HTN (hypertension)    Myocardial infarct (HCC)    Non-small cell lung cancer ([place]) 08/2010   Stage IA, status post left upper lobectomy July 2012   Tobacco abuse     Past Surgical History:  Procedure Laterality Date   CARDIAC  CATHETERIZATION  07/06/2010   see CABG report - pt sent to OR   CARDIOVASCULAR STRESS TEST  09/11/2010   R/S MV - normal perfusion in all regions, EF 46%, no scintigraphic evidence of inducible myocardial ischemia; global LV systolic function mildly reduced; no significant wall motion abnormalities noted; Exercise capacity 7 METS; EKG negative for ischemia; low risk study, no signifcant change from previous study 08/2003   CORONARY ARTERY BYPASS GRAFT  07/11/2010   LIMA to LAD; SVG to 2nd branch OM; SVG to posterior descending artery   LEFT VATS  09/17/2010   TEE WITHOUT CARDIOVERSION  07/06/2010   during emergent CABG surgery; 2-3+ mitral regurgitation    No Known Allergies  Current Outpatient Medications  Medication Sig Dispense Refill   aspirin 81 MG tablet Take 81 mg by mouth daily.     budesonide (PULMICORT) 0.5 MG/2ML nebulizer solution Inhale into the lungs as needed.     Coenzyme Q10 300 MG CAPS Take by mouth.     fluticasone (FLONASE) 50 MCG/ACT nasal spray Place 1 spray into both nostrils as needed.     furosemide (LASIX) 20 MG tablet Take 1 tablet (20 mg total) by mouth daily. 90 tablet 1   INCRUSE ELLIPTA 62.5 MCG/INH AEPB Inhale 1 puff into the lungs daily.     levothyroxine (SYNTHROID) 25 MCG tablet TAKE 0.5 TABLETS (12.5 MCG TOTAL) BY MOUTH DAILY. IN ADDITION TO THE 50 MCG. 45 tablet 1   levothyroxine (SYNTHROID) 50 MCG tablet TAKE 1 TABLET BY MOUTH EVERY DAY BEFORE BREAKFAST 90 tablet 0   metoprolol succinate (TOPROL-XL) 100 MG 24 hr tablet Take 100 mg by mouth daily. Take with or immediately following a meal.     Omega-3 Fatty Acids (FISH OIL) 1000 MG CAPS Take 2,000 mg by mouth daily.     rosuvastatin (CRESTOR) 40 MG tablet Take 1 tablet (40 mg total) by mouth daily. 90 tablet 3   No current facility-administered medications for this visit.    Also she is married has 3 children 5 grandchildren. She is an ex-smoker. She does not routinely exercise. There is no alcohol  use.  ROS General: Negative; No fevers, chills, or night sweats; positive for purposeful weight loss HEENT: Negative; No changes in vision or hearing, sinus congestion, difficulty swallowing Pulmonary: Negative; No cough, wheezing, shortness of breath, hemoptysis Cardiovascular: See HPI; mild right leg swelling GI: Negative; No nausea, vomiting, diarrhea, or abdominal pain GU: Negative; No dysuria, hematuria, or difficulty voiding Musculoskeletal: Negative; no myalgias, joint  pain, or weakness Hematologic/Oncology: Negative; no easy bruising, bleeding Endocrine: Positive for insulin resistance; no heat/cold intolerance; no diabetes Neuro: Negative; no changes in balance, headaches Skin: Negative; No rashes or skin lesions Psychiatric: Negative; No behavioral problems, depression Sleep: Negative; No snoring, daytime sleepiness, hypersomnolence, bruxism, restless legs, hypnogognic hallucinations, no cataplexy Other comprehensive 14 point system review is negative.   PE BP 118/70 (BP Location: Right Arm, Patient Position: Sitting, Cuff Size: Large)   Pulse 76   Ht '5\' 4"'  (1.626 m)   Wt 211 lb 12.8 oz (96.1 kg)   SpO2 98%   BMI 36.36 kg/m    Repeat blood pressure by me was 122/68  Wt Readings from Last 3 Encounters:  11/29/20 211 lb 12.8 oz (96.1 kg)  11/16/20 212 lb (96.2 kg)  10/25/20 212 lb 6.4 oz (96.3 kg)    General: Alert, oriented, no distress.  Skin: normal turgor, no rashes, warm and dry HEENT: Normocephalic, atraumatic. Pupils equal round and reactive to light; sclera anicteric; extraocular muscles intact;  Nose without nasal septal hypertrophy Mouth/Parynx benign; Mallinpatti scale 3 Neck: Thick neck;  JVD, no carotid bruits; normal carotid upstroke Lungs: clear to ausculatation and percussion; no wheezing or rales Chest wall: without tenderness to palpitation Heart: PMI not displaced, RRR, s1 s2 normal, 1/6 systolic murmur, no diastolic murmur, no rubs, gallops,  thrills, or heaves Abdomen: soft, nontender; no hepatosplenomehaly, BS+; abdominal aorta nontender and not dilated by palpation. Back: no CVA tenderness Pulses 2+ Musculoskeletal: full range of motion, normal strength, no joint deformities Extremities: no clubbing cyanosis or edema, Homan's sign negative  Neurologic: grossly nonfocal; Cranial nerves grossly wnl Psychologic: Normal mood and affect   November 29, 2020 ECG (independently read by me):  NSR at 76, Q III, QTc 463 msec  July 24, 2020 ECG (independently read by me):  NSR at 72; PRWP; QTc 466 msec  February 2020 ECG (independently read by me): NSR at 74; QS V1-3, no ectopy, normal intervls  December 2020 ECG (independently read by me): Normal sinus rhythm at 74 bpm.  Poor R wave progression with QS complex V1 through V4.  No ectopy.  Normal intervals.  January 2020 ECG (independently read by me): Normal sinus rhythm at 73 bpm.  Anteroseptal QS complex  January 2019 ECG (independently read by me): normal sinus rhythm at 76 bpm.  Q wave in lead 3.  Nonspecific T changes.  October 2018 ECG (independently read by me): Normal sinus rhythm at 73 bpm.  Q wave in 3 and very small Q wave in aVF.  Nonspecific T changes anteriorly.  Normal intervals.  October 2017 ECG (independently read by me): Normal sinus rhythm at 65 bpm.  Nonspecific T changes.  Q wave in lead 3.  October 2016 ECG (independently read by me): Normal sinus rhythm at 77 bpm.  Nonspecific T changes anteriorly which are old.  Normal intervals.  September 2015 ECG (independently read by me): Normal sinus rhythm at 70 beats per minute.  Poor progression anteriorly.  Small Q-wave in lead 3.  Nonspecific T changes anteriorly, unchanged  04/16/2013 ECG (independently read by me): Normal sinus rhythm at 65 beats per minute. T wave changes V1 through V6, nonspecific, unchanged. QTc interval 455 ms  Prior ECG of October 15 2012: NSR @ 62 bpm with T-wave abnormality anteriorly V1  through V5 unchanged.  LABS: BMP Latest Ref Rng & Units 10/25/2020 07/24/2020 06/29/2020  Glucose 65 - 99 mg/dL 97 89 92  BUN 8 - 27  mg/dL '11 13 16  ' Creatinine 0.57 - 1.00 mg/dL 0.72 1.06(H) 1.44(H)  BUN/Creat Ratio 12 - '28 15 12 ' -  Sodium 134 - 144 mmol/L 147(H) 145(H) 143  Potassium 3.5 - 5.2 mmol/L 4.8 4.8 3.9  Chloride 96 - 106 mmol/L 100 101 97  CO2 20 - 29 mmol/L 36(H) 29 36(H)  Calcium 8.7 - 10.3 mg/dL 9.8 10.0 10.1   Hepatic Function Latest Ref Rng & Units 10/25/2020 07/24/2020 03/21/2020  Total Protein 6.0 - 8.5 g/dL 6.8 7.6 7.1  Albumin 3.7 - 4.7 g/dL 4.1 4.4 4.2  AST 0 - 40 IU/L 47(H) 24 21  ALT 0 - 32 IU/L 52(H) 24 30  Alk Phosphatase 44 - 121 IU/L 78 83 72  Total Bilirubin 0.0 - 1.2 mg/dL 0.3 <0.2 0.3   CBC Latest Ref Rng & Units 10/25/2020 07/24/2020 06/29/2020  WBC 3.4 - 10.8 x10E3/uL 8.4 8.9 12.4(H)  Hemoglobin 11.1 - 15.9 g/dL 13.4 14.0 12.7  Hematocrit 34.0 - 46.6 % 44.0 45.4 41.3  Platelets 150 - 450 x10E3/uL 250 262 298.0   Lab Results  Component Value Date   MCV 86 10/25/2020   MCV 86 07/24/2020   MCV 89.4 06/29/2020   Lab Results  Component Value Date   TSH 4.650 (H) 10/25/2020   Lab Results  Component Value Date   HGBA1C 6.2 (H) 10/25/2020   Lipid Panel     Component Value Date/Time   CHOL 112 10/25/2020 0935   CHOL 114 07/22/2012 0840   TRIG 114 10/25/2020 0935   TRIG 170 (H) 07/22/2012 0840   HDL 47 10/25/2020 0935   HDL 42 07/22/2012 0840   CHOLHDL 2.4 10/25/2020 0935   CHOLHDL 3.2 06/26/2016 0859   VLDL 25 06/26/2016 0859   LDLCALC 44 10/25/2020 0935   LDLCALC 38 07/22/2012 0840      RADIOLOGY: No results found.  IMPRESSION:  1. Paroxysmal atrial fibrillation (HCC)   2. Hx of CABG: 2012   3. Hyperlipidemia with target LDL less than 70   4. Coronary artery disease due to lipid rich plaque   5. History of lung cancer   6. Moderate obesity   7. Tobacco abuse     ASSESSMENT AND PLAN: Ms. Ontko is a 75 year old Caucasian female who  is 25 years following her initial anterior wall myocardial infarction with VF arrest for which she underwent PTCA emergently of her LAD in 1997. She is 10 years following emergency CABG surgery for life-threatening anatomy with 99% left main disease and total occlusion of her RCA at the ostium with left to right collaterals. Her ejection fraction did improve and is now 50-55% with small area of subtle LAD scar.  A follow-up myocardial perfusion study on 11/05/2016 continued to show normal perfusion without scar or ischemia .  She was fortuitously found to have cancer, stage Ia, non-small cell carcinoma and has been without recurrence.  When I saw her in February 2022 her blood pressure was stable on a regimen consisting of amlodipine 5 mg, and metoprolol succinate 100 mg.  She was not having any palpitations.  She continues to be on rosuvastatin 40 mg daily.  Lipid studies from March 21, 2020 were excellent with total cholesterol 123 and LDL cholesterol 56.  Upon return from [place], she was hospitalized in [facility] with iron deficiency anemia, requiring 2 units of packed red blood cells.  She also went into atrial fibrillation for which she was treated with amiodarone.  Her medications were adjusted.  When  I last saw her in July 21, 2019 her amiodarone had been discontinued 3 days previously by her primary provider due to complaints of side effects and hair loss.  She was on levothyroxine 62.5 mcg daily.  She underwent an echo Doppler study on November 14, 2020 which continue to show low normal LV function with EF at 50 to 55%, with mildly decreased LV global strain with grade 1 diastolic dysfunction.  There was mild mitral regurgitation.  Her follow-up CT scan has now demonstrated a new 9 mm subpleural nodule in the left lower lobe.  This does raise concern for possible new primary bronchogenic carcinoma but included in the differential is a nodule left over from her recent pneumonia or other infectious  or inflammatory nodules.  A follow-up PET/CT scan will be repeated in November with follow-up with Dr. Roxan Hockey.,  The patient continues to smoke cigarettes 1 pack every 3 days.  I again discussed with her the importance of complete smoking cessation but I do not believe she will do this.  She also uses supplemental oxygen usually at nighttime and intermittently throughout the day but oftentimes does not have oxygen saturations level greater than 88%.  She has underlying COPD and emphysema.  Since she is now 1210 years following her CABG revascularization in 4 years since her last myocardial perfusion study, I have recommended she undergo a follow-up Lexiscan Myoview study to make certain some of her exertional dyspnea is not potentially ischemia mediated.  This will be scheduled sometime in November following her return from [place] which she will be leaving for tomorrow.  I also have recommended follow-up laboratory since on recent laboratory she did have new mild transaminase elevation.  She has been off amiodarone but is on statin therapy with rosuvastatin 40 mg daily.  I will see her in 4 months for follow-up evaluation or sooner as needed   Troy Sine, MD, [facility]  11/29/2020 1:09 PM

## 2020-11-29 NOTE — Patient Instructions (Signed)
Medication Instructions:  Your physician recommends that you continue on your current medications as directed. Please refer to the Current Medication list given to you today.  *If you need a refill on your cardiac medications before your next appointment, please call your pharmacy*  Testing/Procedures: Your physician has requested that you have a lexiscan myoview. For further information please visit [email or web address]. Please follow instruction sheet, as given.   How to prepare for your Myocardial Perfusion Test: Do not eat or drink 3 hours prior to your test, except you may have water. Do not consume products containing caffeine (regular or decaffeinated) 12 hours prior to your test. (ex: coffee, chocolate, sodas, tea). Do bring a list of your current medications with you.  If not listed below, you may take your medications as normal. Do wear comfortable clothes (no dresses or overalls) and walking shoes, tennis shoes preferred (No heels or open toe shoes are allowed). Do NOT wear cologne, perfume, aftershave, or lotions (deodorant is allowed). The test will take approximately 3 to 4 hours to complete If these instructions are not followed, your test will have to be rescheduled.  Follow-Up: At [facility], you and your health needs are our priority.  As part of our continuing mission to provide you with exceptional heart care, we have created designated Provider Care Teams.  These Care Teams include your primary Cardiologist (physician) and Advanced Practice Providers (APPs -  Physician Assistants and Nurse Practitioners) who all work together to provide you with the care you need, when you need it.  We recommend signing up for the patient portal called "MyChart".  Sign up information is provided on this After Visit Summary.  MyChart is used to connect with patients for Virtual Visits (Telemedicine).  Patients are able to view lab/test results, encounter notes, upcoming appointments, etc.   Non-urgent messages can be sent to your provider as well.   To learn more about what you can do with MyChart, go to [email or web address].    Your next appointment:   February with Dr. Claiborne Billings

## 2020-12-04 ENCOUNTER — Other Ambulatory Visit (HOSPITAL_COMMUNITY): Payer: Medicare HMO

## 2020-12-19 ENCOUNTER — Other Ambulatory Visit: Payer: Self-pay | Admitting: Cardiovascular Disease

## 2020-12-19 ENCOUNTER — Ambulatory Visit: Payer: Medicare HMO | Admitting: Thoracic Surgery (Cardiothoracic Vascular Surgery)

## 2020-12-19 ENCOUNTER — Other Ambulatory Visit: Payer: Self-pay | Admitting: Nurse Practitioner

## 2020-12-20 DIAGNOSIS — D6859 Other primary thrombophilia: Secondary | ICD-10-CM | POA: Diagnosis not present

## 2020-12-20 DIAGNOSIS — J439 Emphysema, unspecified: Secondary | ICD-10-CM | POA: Diagnosis not present

## 2020-12-20 DIAGNOSIS — J9610 Chronic respiratory failure, unspecified whether with hypoxia or hypercapnia: Secondary | ICD-10-CM | POA: Diagnosis not present

## 2020-12-20 DIAGNOSIS — I700 Atherosclerosis of aorta: Secondary | ICD-10-CM | POA: Diagnosis not present

## 2020-12-20 DIAGNOSIS — I509 Heart failure, unspecified: Secondary | ICD-10-CM | POA: Diagnosis not present

## 2020-12-20 DIAGNOSIS — E261 Secondary hyperaldosteronism: Secondary | ICD-10-CM | POA: Diagnosis not present

## 2020-12-20 DIAGNOSIS — D6869 Other thrombophilia: Secondary | ICD-10-CM | POA: Diagnosis not present

## 2020-12-20 DIAGNOSIS — I4891 Unspecified atrial fibrillation: Secondary | ICD-10-CM | POA: Diagnosis not present

## 2020-12-20 DIAGNOSIS — E785 Hyperlipidemia, unspecified: Secondary | ICD-10-CM | POA: Diagnosis not present

## 2020-12-20 DIAGNOSIS — Z008 Encounter for other general examination: Secondary | ICD-10-CM | POA: Diagnosis not present

## 2020-12-20 DIAGNOSIS — I110 Hypertensive heart disease with heart failure: Secondary | ICD-10-CM | POA: Diagnosis not present

## 2020-12-20 DIAGNOSIS — E039 Hypothyroidism, unspecified: Secondary | ICD-10-CM | POA: Diagnosis not present

## 2020-12-20 DIAGNOSIS — K219 Gastro-esophageal reflux disease without esophagitis: Secondary | ICD-10-CM | POA: Diagnosis not present

## 2020-12-20 MED ORDER — METOPROLOL SUCCINATE ER 100 MG PO TB24: 100.0000 mg | ORAL_TABLET | Freq: Every day | ORAL | 1 refills | Status: DC

## 2020-12-21 ENCOUNTER — Other Ambulatory Visit: Payer: Self-pay

## 2020-12-21 ENCOUNTER — Ambulatory Visit (HOSPITAL_COMMUNITY)
Admission: RE | Admit: 2020-12-21 | Discharge: 2020-12-21 | Disposition: A | Discharge status: Home / Self Care | Payer: Medicare HMO | Source: Ambulatory Visit | Attending: Thoracic Surgery (Cardiothoracic Vascular Surgery) | Admitting: Thoracic Surgery (Cardiothoracic Vascular Surgery)

## 2020-12-21 DIAGNOSIS — Z902 Acquired absence of lung [part of]: Secondary | ICD-10-CM | POA: Diagnosis not present

## 2020-12-21 DIAGNOSIS — R911 Solitary pulmonary nodule: Secondary | ICD-10-CM | POA: Insufficient documentation

## 2020-12-21 DIAGNOSIS — I700 Atherosclerosis of aorta: Secondary | ICD-10-CM | POA: Diagnosis not present

## 2020-12-21 DIAGNOSIS — K7689 Other specified diseases of liver: Secondary | ICD-10-CM | POA: Diagnosis not present

## 2020-12-21 DIAGNOSIS — J439 Emphysema, unspecified: Secondary | ICD-10-CM | POA: Diagnosis not present

## 2020-12-21 DIAGNOSIS — J432 Centrilobular emphysema: Secondary | ICD-10-CM | POA: Diagnosis not present

## 2020-12-21 DIAGNOSIS — Z85118 Personal history of other malignant neoplasm of bronchus and lung: Secondary | ICD-10-CM | POA: Diagnosis not present

## 2020-12-21 DIAGNOSIS — I7789 Other specified disorders of arteries and arterioles: Secondary | ICD-10-CM | POA: Diagnosis not present

## 2020-12-21 LAB — GLUCOSE, CAPILLARY: Glucose-Capillary: 102 mg/dL — ABNORMAL HIGH (ref 70–99)

## 2020-12-21 MED ORDER — FLUDEOXYGLUCOSE F - 18 (FDG) INJECTION
10.4800 | Freq: Once | INTRAVENOUS | Status: AC
  Administered 2020-12-21: 10.48 via INTRAVENOUS

## 2020-12-26 ENCOUNTER — Ambulatory Visit: Payer: Medicare HMO | Admitting: Thoracic Surgery (Cardiothoracic Vascular Surgery)

## 2020-12-26 ENCOUNTER — Encounter: Payer: Self-pay | Admitting: Thoracic Surgery (Cardiothoracic Vascular Surgery)

## 2020-12-26 ENCOUNTER — Other Ambulatory Visit: Payer: Self-pay

## 2020-12-26 ENCOUNTER — Telehealth (HOSPITAL_COMMUNITY): Payer: Self-pay | Admitting: *Deleted

## 2020-12-26 VITALS — BP 115/76 | HR 83 | Resp 20 | Ht 64.5 in | Wt 212.6 lb

## 2020-12-26 DIAGNOSIS — J9601 Acute respiratory failure with hypoxia: Secondary | ICD-10-CM | POA: Diagnosis not present

## 2020-12-26 DIAGNOSIS — C3410 Malignant neoplasm of upper lobe, unspecified bronchus or lung: Secondary | ICD-10-CM | POA: Diagnosis not present

## 2020-12-26 NOTE — Progress Notes (Signed)
Location of tumor and Histology per Pathology Report: LLL lung, hypermetabolic nodule on PET  Biopsy: none at this time  Past/Anticipated interventions by surgeon, if any:  h/o LU lobectomy, not a surgical candidate for this lesion  Past/Anticipated interventions by medical oncology, if any: Chemotherapy - none    Pain issues, if any:  no   SAFETY ISSUES: Prior radiation? no Pacemaker/ICD? no Possible current pregnancy? no Is the patient on methotrexate? no  Current Complaints / other details:  shortness of breath at rest, productive cough with clear phlegm without hemoptysis     Vitals:   12/28/20 1239  BP: (!) 109/52  Pulse: 80  Resp: 18  Temp: (!) 97.3 F (36.3 C)  SpO2: 92%  Weight: 211 lb 12.8 oz (96.1 kg)  Height: 5\' 4"  (1.626 m)

## 2020-12-26 NOTE — Progress Notes (Signed)
[address]       [place] [address]             [phone]       HPI: [name] returns to discuss the results of her PET/CT  [name] is a 75 year old woman with a history of CAD, MI, cardiac arrest, CABG, stage Ia lung cancer, left upper lobectomy, hypertension, hyperlipidemia, reflux, tobacco abuse, and COPD.  She had an MI with cardiac arrest in the late 90s.  She was treated with an angioplasty at that time.  In 2012 she presented with an MI and had critical left main and RCA disease.  She underwent emergent coronary bypass grafting.  During the mammary takedown she was noted to have a left upper lobe lung mass.  After recovering from her bypass surgery she underwent a left upper lobectomy.  She had a stage Ia non-small cell carcinoma.  She did not require adjuvant therapy.  Unfortunately she continues to smoke.  I saw her in the office in September.  She had been hospitalized in March with pneumonia.  She is on home O2 at times.  Rarely has O2 sats above 88%.  Gets short of breath with relatively light exertion.  Her CT scan showed a new left lower lobe subpleural lung nodule.  She now returns with a PET/CT to follow up on that.  Past Medical History:  Diagnosis Date   CAD (coronary artery disease)    Cancer ([facility])    Dyslipidemia    GERD (gastroesophageal reflux disease)    HTN (hypertension)    Myocardial infarct ([facility])    Non-small cell lung cancer ([place]) 08/2010   Stage IA, status post left upper lobectomy July 2012   Tobacco abuse     Current Outpatient Medications  Medication Sig Dispense Refill   aspirin 81 MG tablet Take 81 mg by mouth daily.     budesonide (PULMICORT) 0.5 MG/2ML nebulizer solution Inhale into the lungs as needed.     Coenzyme Q10 300 MG CAPS Take by mouth.     fluticasone (FLONASE) 50 MCG/ACT nasal spray Place 1 spray into both nostrils as needed.     furosemide (LASIX) 20 MG tablet Take 1 tablet (20 mg total) by  mouth daily. 90 tablet 1   INCRUSE ELLIPTA 62.5 MCG/INH AEPB Inhale 1 puff into the lungs daily.     levothyroxine (SYNTHROID) 25 MCG tablet TAKE 0.5 TABLETS (12.5 MCG TOTAL) BY MOUTH DAILY. IN ADDITION TO THE 50 MCG. 45 tablet 1   levothyroxine (SYNTHROID) 50 MCG tablet TAKE 1 TABLET BY MOUTH EVERY DAY BEFORE BREAKFAST 90 tablet 0   metoprolol succinate (TOPROL-XL) 100 MG 24 hr tablet Take 1 tablet (100 mg total) by mouth daily. Take with or immediately following a meal. 90 tablet 1   Omega-3 Fatty Acids (FISH OIL) 1000 MG CAPS Take 2,000 mg by mouth daily.     rosuvastatin (CRESTOR) 40 MG tablet Take 1 tablet (40 mg total) by mouth daily. 90 tablet 3   No current facility-administered medications for this visit.    Physical Exam BP 115/76 (BP Location: Right Arm, Patient Position: Sitting, Cuff Size: Large)   Pulse 83   Resp 20   Ht 5' 4.5" (1.638 m)   Wt 212 lb 9.6 oz (96.4 kg)   SpO2 95% Comment: RA  BMI 35.46 kg/m  75 year old woman in no acute distress  Diagnostic Tests: NUCLEAR MEDICINE PET SKULL BASE TO THIGH  TECHNIQUE: 10.5 mCi F-18 FDG was injected intravenously. Full-ring PET imaging was performed from the skull base to thigh after the radiotracer. CT data was obtained and used for attenuation correction and anatomic localization.   Fasting blood glucose: 102 mg/dl   COMPARISON:  11/03/2020 chest CT.  Remote PET of 07/24/2010   FINDINGS: Mediastinal blood pool activity: SUV max 2.9   Liver activity: SUV max NA   NECK: No areas of abnormal hypermetabolism.   Incidental CT findings: Bilateral carotid atherosclerosis. No cervical adenopathy.   CHEST: The pleural-based left lower lobe in this patient who is status post at least partial left upper lobectomy, measures 12 mm and a S.U.V. max of 11.3 on 62/4. Felt to be slightly enlarged compared to 9 mm on 11/03/2020.   No thoracic nodal hypermetabolism.   Incidental CT findings: Moderate cardiomegaly. Median  sternotomy for CABG. Aortic atherosclerosis. Right pulmonary artery enlargement, including 3.0 cm. Centrilobular emphysema.   ABDOMEN/PELVIS: No abdominopelvic parenchymal or nodal hypermetabolism.   Incidental CT findings: Normal adrenal glands. Bilateral renal vascular calcification. Left hepatic lobe 1.4 cm cyst. Scattered colonic diverticula. Pelvic floor laxity.   SKELETON: No abnormal marrow activity.   Incidental CT findings: Left rib deformities are likely post surgical.   IMPRESSION: 1. Hypermetabolism corresponding to the left lower lobe pulmonary nodule, most consistent with metachronous primary bronchogenic carcinoma. 2. No evidence of thoracic nodal or hypermetabolic extrathoracic metastasis. 3. Incidental findings, including: Aortic atherosclerosis (ICD10-I70.0) and emphysema (ICD10-J43.9). Pulmonary artery enlargement suggests pulmonary arterial hypertension.     Electronically Signed   By: Abigail Miyamoto M.D.   On: 12/22/2020 08:35 I personally reviewed the PET/CT images.  There is a hypermetabolic left lower lobe lung nodule.  Findings consistent with new primary bronchogenic carcinoma.  Impression: [name] is a 75 year old woman with a history of CAD, MI, cardiac arrest, CABG, stage Ia lung cancer, left upper lobectomy, hypertension, hyperlipidemia, reflux, tobacco abuse, and COPD.    On a recent follow-up CT she had a new 9 mm left lower lobe lung nodule.  She now returns with a short-term interval follow-up PET/CT.  The nodule is markedly hypermetabolic.  There is no evidence of regional or distant metastases.  Findings are consistent with a metachronous T1, N0, stage Ia non-small cell carcinoma.  She is not a good surgical candidate.  She has limited pulmonary reserve.  Even doing a wedge resection would be challenging given her 2 previous operations.  In my opinion her best option would be stereotactic radiation.  She is a little reluctant to  consider radiation because of what she has heard about it.  I informed her that there has been tremendous progress recently with much less damage to surrounding tissues.  She is agreeable to seeing radiation oncology.  The question will be whether they need a biopsy.  If they do think a needle biopsy would be the best option if possible.  Plan: Refer to radiation oncology  Melrose Nakayama, MD [facility] [phone]

## 2020-12-26 NOTE — Telephone Encounter (Signed)
Close encounter 

## 2020-12-27 NOTE — Progress Notes (Signed)
Radiation Oncology         [phone] ________________________________  Initial Outpatient Consultation  Name: [name] MRN: [id number]  Date: 12/28/2020  DOB: [birth year]  CC:[name], NP  Melrose Nakayama, *   REFERRING PHYSICIAN: Melrose Nakayama, *  DIAGNOSIS: PET positive pulmonary nodule presenting in the left lower lobe, clinical stage IA2 (T1b, N0, M0)  New 42mm subpleural nodule in the left lower lobe, suspicious for primary bronchogenic carcinoma  History of stage Ia non-small cell carcinoma of the left lung with left upper lobectomy, diagnosed around 2012  [name] is a 75 y.o. female who is accompanied by no one. she is seen as a courtesy of Dr. Roxan Hockey for an opinion concerning radiation therapy as part of management for her recently diagnosed LLL pulmonary nodule. The patient has a history of CAD, MI, CABG, stage Ia non-small cell carcinoma of the lung (detailed further below*), left upper lobectomy, hypertension, hyperlipidemia, reflux, and ongoing tobacco abuse. *In 2012, she presented with an MI and underwent emergent coronary bypass grafting. During this procedure, she was found to have a left upper lobe mass. After recovering from her coronary bypass grafting, she underwent a left upper lobectomy which revealed stage Ia non-small cell carcinoma. The patient has been followed by Dr. Roxan Hockey for quite a while. During a visit with Dr. Roxan Hockey in 2021, the patient reported that she was hospitalized in [place] with pneumonia and influenza which she had a hard time recovering from. Chest CT performed on 10/26/2019 showed no evidence of recurrent or metastatic disease within the chest.  In recent history, the patient presented for a follow up chest CT on 11/03/20 which demonstrated a new nodular 9 mm subpleural lesion in the left lower lobe, suspicious for recurrence of disease. A stable 5 mm  perifissural nodule in the right middle lobe was also appreciated, consistent with benign etiology (per Dr. Roxan Hockey, benign nodule was identified while the patient was receiving treatment for pneumonia in [place] in 2021).   Accordingly, the patient presented to Dr. Roxan Hockey on 11/16/20 for follow-up and to discuss recent imaging. Dr. Roxan Hockey noted the new LLL nodule as concerning for primary bronchogenic carcinoma. During this visit, the patient reported that she was recently started on diuretics, and that she does not use her supplemental oxygen all of the time. Patient also stated that her O2 saturations are rarely above 88%, and that she has shortness of breath on minimal exertion. Per Dr. Roxan Hockey, the patient is likely not a surgical candidate due to limited pulmonary reserve and her surgical history, and will likely benefit from stereotactic radiation in treatment of the new nodule.   Recent pertinent imaging includes PET scan on 12/21/20 which demonstrated the hypermetabolism corresponding to the left lower lobe pulmonary nodule as most consistent with metachronous primary bronchogenic carcinoma. No evidence of thoracic nodal, or hypermetabolic extrathoracic metastases were appreciated. Incidental findings included aortic atherosclerosis and emphysema.  Pulmonary artery enlargement seen on imaging noted as suggestive of pulmonary arterial hypertension.  The patient most recently followed up with Dr. Roxan Hockey on 12/26/20. The patient expressed some reluctance during this visit about pursuing radiation therapy. Following a detailed discussion with Dr. Roxan Hockey regarding radiation therapy, the patient was agreeable to see me today to discuss treatment options.  Of note: most recent echocardiogram performed on 11/14/20 showed the left ventricle with low-normal function, with an EF of about 50-55%. LV diastolic parameters were noted as consistent with Grade I  diastolic dysfunction  (impaired relaxation). Right ventricular systolic function was normal.     PREVIOUS RADIATION THERAPY: No  PAST MEDICAL HISTORY:  Past Medical History:  Diagnosis Date   CAD (coronary artery disease)    Cancer ([place])    Dyslipidemia    GERD (gastroesophageal reflux disease)    HTN (hypertension)    Myocardial infarct ([facility])    Non-small cell lung cancer ([place]) 08/2010   Stage IA, status post left upper lobectomy July 2012   Tobacco abuse     PAST SURGICAL HISTORY: Past Surgical History:  Procedure Laterality Date   CARDIAC CATHETERIZATION  07/06/2010   see CABG report - pt sent to OR   CARDIOVASCULAR STRESS TEST  09/11/2010   R/S MV - normal perfusion in all regions, EF 46%, no scintigraphic evidence of inducible myocardial ischemia; global LV systolic function mildly reduced; no significant wall motion abnormalities noted; Exercise capacity 7 METS; EKG negative for ischemia; low risk study, no signifcant change from previous study 08/2003   CORONARY ARTERY BYPASS GRAFT  07/11/2010   LIMA to LAD; SVG to 2nd branch OM; SVG to posterior descending artery   LEFT VATS  09/17/2010   TEE WITHOUT CARDIOVERSION  07/06/2010   during emergent CABG surgery; 2-3+ mitral regurgitation    FAMILY HISTORY:  Family History  Problem Relation Age of Onset   Hypothyroidism Father    Heart attack Father    Aplastic anemia Maternal Grandfather    Stroke Paternal Grandfather     SOCIAL HISTORY:  Social History   Tobacco Use   Smoking status: Former    Packs/day: 1.00    Years: 58.00    Pack years: 58.00    Types: Cigarettes    Quit date: 03/21/2020    Years since quitting: 0.7   Smokeless tobacco: Never  Vaping Use   Vaping Use: Never used  Substance Use Topics   Alcohol use: No    Alcohol/week: 0.0 standard drinks   Drug use: No    ALLERGIES: No Known Allergies  MEDICATIONS:  Current Outpatient Medications  Medication Sig Dispense Refill   aspirin 81 MG tablet Take 81 mg by mouth  daily.     Coenzyme Q10 300 MG CAPS Take by mouth.     fluticasone (FLONASE) 50 MCG/ACT nasal spray Place 1 spray into both nostrils as needed.     furosemide (LASIX) 20 MG tablet Take 1 tablet (20 mg total) by mouth daily. 90 tablet 1   ibuprofen (ADVIL) 200 MG tablet Take 200 mg by mouth as needed.     levothyroxine (SYNTHROID) 25 MCG tablet TAKE 0.5 TABLETS (12.5 MCG TOTAL) BY MOUTH DAILY. IN ADDITION TO THE 50 MCG. 45 tablet 1   levothyroxine (SYNTHROID) 50 MCG tablet TAKE 1 TABLET BY MOUTH EVERY DAY BEFORE BREAKFAST 90 tablet 0   metoprolol succinate (TOPROL-XL) 100 MG 24 hr tablet Take 1 tablet (100 mg total) by mouth daily. Take with or immediately following a meal. 90 tablet 1   Omega-3 Fatty Acids (FISH OIL) 1000 MG CAPS Take 2,000 mg by mouth daily.     rosuvastatin (CRESTOR) 40 MG tablet Take 1 tablet (40 mg total) by mouth daily. 90 tablet 3   budesonide (PULMICORT) 0.5 MG/2ML nebulizer solution Inhale into the lungs as needed. (Patient not taking: Reported on 12/28/2020)     INCRUSE ELLIPTA 62.5 MCG/INH AEPB Inhale 1 puff into the lungs daily.     No current facility-administered medications for this encounter.  REVIEW OF SYSTEMS:  A 10+ POINT REVIEW OF SYSTEMS WAS OBTAINED including neurology, dermatology, psychiatry, cardiac, respiratory, lymph, extremities, GI, GU, musculoskeletal, constitutional, reproductive, HEENT.  She denies any pain within the chest area significant cough or hemoptysis.  She will use her oxygen occasionally during the daytime.  She does place oxygen on at night when sleeping, 2 L by nasal cannula.  She denies any new headaches or visual problems.   PHYSICAL EXAM:  height is 5\' 4"  (1.626 m) and weight is 211 lb 12.8 oz (96.1 kg). Her temperature is 97.3 F (36.3 C) (abnormal). Her blood pressure is 109/52 (abnormal) and her pulse is 80. Her respiration is 18 and oxygen saturation is 92%.   General: Alert and oriented, in no acute distress HEENT: Head is  normocephalic. Extraocular movements are intact.  Neck: Neck is supple, no palpable cervical or supraclavicular lymphadenopathy. Heart: Regular in rate and rhythm with no murmurs, rubs, or gallops. Chest: Clear to auscultation bilaterally, with no rhonchi, wheezes, or rales.  Faint scar noted along the sternum from her prior CABG.  She also has a faint scar on the left lateral lower chest area from her previous lung cancer surgery. Abdomen: Soft, nontender, nondistended, with no rigidity or guarding. Extremities: No cyanosis or edema. Lymphatics: see Neck Exam Skin: No concerning lesions. Musculoskeletal: symmetric strength and muscle tone throughout. Neurologic: Cranial nerves II through XII are grossly intact. No obvious focalities. Speech is fluent. Coordination is intact. Psychiatric: Judgment and insight are intact. Affect is appropriate.   ECOG = 1  0 - Asymptomatic (Fully active, able to carry on all predisease activities without restriction)  1 - Symptomatic but completely ambulatory (Restricted in physically strenuous activity but ambulatory and able to carry out work of a light or sedentary nature. For example, light housework, office work)  2 - Symptomatic, <50% in bed during the day (Ambulatory and capable of all self care but unable to carry out any work activities. Up and about more than 50% of waking hours)  3 - Symptomatic, >50% in bed, but not bedbound (Capable of only limited self-care, confined to bed or chair 50% or more of waking hours)  4 - Bedbound (Completely disabled. Cannot carry on any self-care. Totally confined to bed or chair)  5 - Death   Eustace Pen MM, Creech RH, Tormey DC, et al. [phone]). "Toxicity and response criteria of the [facility] Group". [place] Oncol. 5 (6): 649-55  LABORATORY DATA:  Lab Results  Component Value Date   WBC 8.4 10/25/2020   HGB 13.4 10/25/2020   HCT 44.0 10/25/2020   MCV 86 10/25/2020   PLT 250 10/25/2020    NEUTROABS 5.2 10/25/2020   Lab Results  Component Value Date   NA 147 (H) 10/25/2020   K 4.8 10/25/2020   CL 100 10/25/2020   CO2 36 (H) 10/25/2020   GLUCOSE 97 10/25/2020   CREATININE 0.72 10/25/2020   CALCIUM 9.8 10/25/2020      RADIOGRAPHY: NM PET Image Restag (PS) Skull Base To Thigh  Result Date: 12/22/2020 CLINICAL DATA:  Initial treatment strategy for history of non-small-cell lung cancer, status post left-sided Vats. Recent chest CT demonstrating a subpleural 9 mm left lung lesion. EXAM: NUCLEAR MEDICINE PET SKULL BASE TO THIGH TECHNIQUE: 10.5 mCi F-18 FDG was injected intravenously. Full-ring PET imaging was performed from the skull base to thigh after the radiotracer. CT data was obtained and used for attenuation correction and anatomic localization. Fasting blood glucose: 102  mg/dl COMPARISON:  11/03/2020 chest CT.  Remote PET of 07/24/2010 FINDINGS: Mediastinal blood pool activity: SUV max 2.9 Liver activity: SUV max NA NECK: No areas of abnormal hypermetabolism. Incidental CT findings: Bilateral carotid atherosclerosis. No cervical adenopathy. CHEST: The pleural-based left lower lobe in this patient who is status post at least partial left upper lobectomy, measures 12 mm and a S.U.V. max of 11.3 on 62/4. Felt to be slightly enlarged compared to 9 mm on 11/03/2020. No thoracic nodal hypermetabolism. Incidental CT findings: Moderate cardiomegaly. Median sternotomy for CABG. Aortic atherosclerosis. Right pulmonary artery enlargement, including 3.0 cm. Centrilobular emphysema. ABDOMEN/PELVIS: No abdominopelvic parenchymal or nodal hypermetabolism. Incidental CT findings: Normal adrenal glands. Bilateral renal vascular calcification. Left hepatic lobe 1.4 cm cyst. Scattered colonic diverticula. Pelvic floor laxity. SKELETON: No abnormal marrow activity. Incidental CT findings: Left rib deformities are likely post surgical. IMPRESSION: 1. Hypermetabolism corresponding to the left lower lobe  pulmonary nodule, most consistent with metachronous primary bronchogenic carcinoma. 2. No evidence of thoracic nodal or hypermetabolic extrathoracic metastasis. 3. Incidental findings, including: Aortic atherosclerosis (ICD10-I70.0) and emphysema (ICD10-J43.9). Pulmonary artery enlargement suggests pulmonary arterial hypertension. Electronically Signed   By: Abigail Miyamoto M.D.   On: 12/22/2020 08:35      IMPRESSION:  PET positive pulmonary nodule presenting in the left lower lobe, clinical stage IA2 (T1b, N0, M0)  I reviewed the patient's imaging with her carefully.  We discussed that this is likely a new lung cancer given the long interval since her previous surgery.  We discussed that on the PET scan her lesion is markedly hypermetabolic with an SUV of 06.2.  This would be consistent with lung cancer, likely a fast-growing malignancy.  We discussed potential biopsy of this lesion but she is most comfortable with proceeding without tissue diagnosis in light of her previous history of lung cancer.  Given her previous cardiac history and lung cancer surgery she is not felt to be a good candidate for additional lung cancer surgery.  The patient's case was presented at the multidisciplinary thoracic oncology conference earlier today.  Her images and history were reviewed.  Consensus was that patient should proceed with SBRT for management of her solitary pulmonary nodule.  Today, I talked to the patient  about the findings and work-up thus far.  We discussed the natural history of non-small cell lung cancer and general treatment, highlighting the role of radiotherapy (SBRT) in the management.  We discussed the available radiation techniques, and focused on the details of logistics and delivery.  We reviewed the anticipated acute and late sequelae associated with radiation in this setting.  The patient was encouraged to ask questions that I answered to the best of my ability.  A patient consent form was discussed  and signed.  We retained a copy for our records.  The patient would like to proceed with radiation and will be scheduled for CT simulation.  PLAN: She will return on November 16 for SBRT simulation.  Treatments to begin approximately a week later.  Anticipate 3 SBRT treatments directed at the solitary pulmonary nodule.   60 minutes of total time was spent for this patient encounter, including preparation, face-to-face counseling with the patient and coordination of care, physical exam, and documentation of the encounter.   ------------------------------------------------  [name], PhD, MD  This document serves as a record of services personally performed by Gery Pray, MD. It was created on his behalf by Roney Mans, a trained medical scribe. The creation of this record  is based on the scribe's personal observations and the provider's statements to them. This document has been checked and approved by the attending provider.

## 2020-12-28 ENCOUNTER — Ambulatory Visit (HOSPITAL_COMMUNITY)
Admission: RE | Admit: 2020-12-28 | Discharge: 2020-12-28 | Disposition: A | Discharge status: Home / Self Care | Payer: Medicare HMO | Source: Ambulatory Visit | Attending: Cardiovascular Disease | Admitting: Cardiovascular Disease

## 2020-12-28 ENCOUNTER — Encounter: Payer: Self-pay | Admitting: Radiation Oncology

## 2020-12-28 ENCOUNTER — Ambulatory Visit
Admission: RE | Admit: 2020-12-28 | Discharge: 2020-12-28 | Disposition: A | Discharge status: Home / Self Care | Payer: Medicare HMO | Source: Ambulatory Visit | Attending: Radiation Oncology | Admitting: Radiation Oncology

## 2020-12-28 ENCOUNTER — Other Ambulatory Visit: Payer: Self-pay

## 2020-12-28 VITALS — BP 109/52 | HR 80 | Temp 97.3°F | Resp 18 | Ht 64.0 in | Wt 211.8 lb

## 2020-12-28 DIAGNOSIS — Z923 Personal history of irradiation: Secondary | ICD-10-CM | POA: Insufficient documentation

## 2020-12-28 DIAGNOSIS — J432 Centrilobular emphysema: Secondary | ICD-10-CM | POA: Insufficient documentation

## 2020-12-28 DIAGNOSIS — C3492 Malignant neoplasm of unspecified part of left bronchus or lung: Secondary | ICD-10-CM

## 2020-12-28 DIAGNOSIS — E039 Hypothyroidism, unspecified: Secondary | ICD-10-CM | POA: Insufficient documentation

## 2020-12-28 DIAGNOSIS — Z85118 Personal history of other malignant neoplasm of bronchus and lung: Secondary | ICD-10-CM | POA: Insufficient documentation

## 2020-12-28 DIAGNOSIS — R911 Solitary pulmonary nodule: Secondary | ICD-10-CM | POA: Insufficient documentation

## 2020-12-28 DIAGNOSIS — E785 Hyperlipidemia, unspecified: Secondary | ICD-10-CM | POA: Insufficient documentation

## 2020-12-28 DIAGNOSIS — I2510 Atherosclerotic heart disease of native coronary artery without angina pectoris: Secondary | ICD-10-CM | POA: Insufficient documentation

## 2020-12-28 DIAGNOSIS — I252 Old myocardial infarction: Secondary | ICD-10-CM | POA: Insufficient documentation

## 2020-12-28 DIAGNOSIS — K219 Gastro-esophageal reflux disease without esophagitis: Secondary | ICD-10-CM | POA: Insufficient documentation

## 2020-12-28 DIAGNOSIS — I6523 Occlusion and stenosis of bilateral carotid arteries: Secondary | ICD-10-CM | POA: Insufficient documentation

## 2020-12-28 DIAGNOSIS — I119 Hypertensive heart disease without heart failure: Secondary | ICD-10-CM | POA: Insufficient documentation

## 2020-12-28 DIAGNOSIS — F17210 Nicotine dependence, cigarettes, uncomplicated: Secondary | ICD-10-CM | POA: Insufficient documentation

## 2020-12-28 DIAGNOSIS — Z951 Presence of aortocoronary bypass graft: Secondary | ICD-10-CM

## 2020-12-28 DIAGNOSIS — I700 Atherosclerosis of aorta: Secondary | ICD-10-CM | POA: Insufficient documentation

## 2020-12-28 DIAGNOSIS — R918 Other nonspecific abnormal finding of lung field: Secondary | ICD-10-CM | POA: Diagnosis not present

## 2020-12-28 DIAGNOSIS — Z7982 Long term (current) use of aspirin: Secondary | ICD-10-CM | POA: Insufficient documentation

## 2020-12-28 DIAGNOSIS — Z87891 Personal history of nicotine dependence: Secondary | ICD-10-CM | POA: Diagnosis not present

## 2020-12-28 DIAGNOSIS — I2583 Coronary atherosclerosis due to lipid rich plaque: Secondary | ICD-10-CM | POA: Diagnosis not present

## 2020-12-28 LAB — MYOCARDIAL PERFUSION IMAGING
LV dias vol: 139 mL (ref 46–106)
LV sys vol: 75 mL
Nuc Stress EF: 46 %
Peak HR: 85 {beats}/min
Rest HR: 64 {beats}/min
SDS: 5
SRS: 6
SSS: 11
ST Depression (mm): 0 mm
TID: 1.21

## 2020-12-28 MED ORDER — TECHNETIUM TC 99M TETROFOSMIN IV KIT
25.9000 | PACK | Freq: Once | INTRAVENOUS | Status: AC | PRN
  Administered 2020-12-28: 25.9 via INTRAVENOUS
  Filled 2020-12-28: qty 26

## 2020-12-28 MED ORDER — AMINOPHYLLINE 25 MG/ML IV SOLN
75.0000 mg | Freq: Once | INTRAVENOUS | Status: AC
  Administered 2020-12-28: 75 mg via INTRAVENOUS

## 2020-12-28 MED ORDER — REGADENOSON 0.4 MG/5ML IV SOLN
0.4000 mg | Freq: Once | INTRAVENOUS | Status: AC
  Administered 2020-12-28: 0.4 mg via INTRAVENOUS

## 2020-12-28 MED ORDER — TECHNETIUM TC 99M TETROFOSMIN IV KIT
8.6000 | PACK | Freq: Once | INTRAVENOUS | Status: AC | PRN
  Administered 2020-12-28: 8.6 via INTRAVENOUS
  Filled 2020-12-28: qty 9

## 2020-12-28 NOTE — Progress Notes (Signed)
See MD note for nursing evaluation. °

## 2021-01-03 ENCOUNTER — Ambulatory Visit
Admission: RE | Admit: 2021-01-03 | Discharge: 2021-01-03 | Disposition: A | Discharge status: Home / Self Care | Payer: Medicare HMO | Source: Ambulatory Visit | Attending: Radiation Oncology | Admitting: Radiation Oncology

## 2021-01-03 ENCOUNTER — Other Ambulatory Visit: Payer: Self-pay

## 2021-01-03 DIAGNOSIS — Z510 Encounter for antineoplastic radiation therapy: Secondary | ICD-10-CM | POA: Insufficient documentation

## 2021-01-03 DIAGNOSIS — C3492 Malignant neoplasm of unspecified part of left bronchus or lung: Secondary | ICD-10-CM

## 2021-01-03 DIAGNOSIS — R911 Solitary pulmonary nodule: Secondary | ICD-10-CM | POA: Insufficient documentation

## 2021-01-03 DIAGNOSIS — R918 Other nonspecific abnormal finding of lung field: Secondary | ICD-10-CM | POA: Diagnosis not present

## 2021-01-03 DIAGNOSIS — Z87891 Personal history of nicotine dependence: Secondary | ICD-10-CM | POA: Diagnosis not present

## 2021-01-09 DIAGNOSIS — R911 Solitary pulmonary nodule: Secondary | ICD-10-CM | POA: Diagnosis not present

## 2021-01-09 DIAGNOSIS — Z87891 Personal history of nicotine dependence: Secondary | ICD-10-CM | POA: Diagnosis not present

## 2021-01-09 DIAGNOSIS — Z510 Encounter for antineoplastic radiation therapy: Secondary | ICD-10-CM | POA: Diagnosis not present

## 2021-01-09 DIAGNOSIS — R918 Other nonspecific abnormal finding of lung field: Secondary | ICD-10-CM | POA: Diagnosis not present

## 2021-01-16 ENCOUNTER — Other Ambulatory Visit: Payer: Self-pay

## 2021-01-16 ENCOUNTER — Ambulatory Visit
Admission: RE | Admit: 2021-01-16 | Discharge: 2021-01-16 | Disposition: A | Discharge status: Home / Self Care | Payer: Medicare HMO | Source: Ambulatory Visit | Attending: Radiation Oncology | Admitting: Radiation Oncology

## 2021-01-16 DIAGNOSIS — C3412 Malignant neoplasm of upper lobe, left bronchus or lung: Secondary | ICD-10-CM

## 2021-01-16 DIAGNOSIS — R911 Solitary pulmonary nodule: Secondary | ICD-10-CM | POA: Diagnosis not present

## 2021-01-16 DIAGNOSIS — Z510 Encounter for antineoplastic radiation therapy: Secondary | ICD-10-CM | POA: Diagnosis not present

## 2021-01-17 ENCOUNTER — Ambulatory Visit: Payer: Medicare HMO | Admitting: Radiation Oncology

## 2021-01-18 ENCOUNTER — Ambulatory Visit
Admission: RE | Admit: 2021-01-18 | Discharge: 2021-01-18 | Disposition: A | Discharge status: Home / Self Care | Payer: Medicare HMO | Source: Ambulatory Visit | Attending: Radiation Oncology | Admitting: Radiation Oncology

## 2021-01-18 ENCOUNTER — Other Ambulatory Visit: Payer: Self-pay

## 2021-01-18 DIAGNOSIS — C3490 Malignant neoplasm of unspecified part of unspecified bronchus or lung: Secondary | ICD-10-CM | POA: Insufficient documentation

## 2021-01-18 DIAGNOSIS — C3410 Malignant neoplasm of upper lobe, unspecified bronchus or lung: Secondary | ICD-10-CM | POA: Diagnosis not present

## 2021-01-18 DIAGNOSIS — C3412 Malignant neoplasm of upper lobe, left bronchus or lung: Secondary | ICD-10-CM | POA: Insufficient documentation

## 2021-01-23 ENCOUNTER — Other Ambulatory Visit: Payer: Self-pay

## 2021-01-23 ENCOUNTER — Ambulatory Visit
Admission: RE | Admit: 2021-01-23 | Discharge: 2021-01-23 | Disposition: A | Discharge status: Home / Self Care | Payer: Medicare HMO | Source: Ambulatory Visit | Attending: Radiation Oncology | Admitting: Radiation Oncology

## 2021-01-23 DIAGNOSIS — C3490 Malignant neoplasm of unspecified part of unspecified bronchus or lung: Secondary | ICD-10-CM | POA: Diagnosis not present

## 2021-01-23 DIAGNOSIS — C3412 Malignant neoplasm of upper lobe, left bronchus or lung: Secondary | ICD-10-CM | POA: Diagnosis not present

## 2021-01-23 DIAGNOSIS — C3410 Malignant neoplasm of upper lobe, unspecified bronchus or lung: Secondary | ICD-10-CM

## 2021-01-25 ENCOUNTER — Ambulatory Visit
Admission: RE | Admit: 2021-01-25 | Discharge: 2021-01-25 | Disposition: A | Discharge status: Home / Self Care | Payer: Medicare HMO | Source: Ambulatory Visit | Attending: Radiation Oncology | Admitting: Radiation Oncology

## 2021-01-25 ENCOUNTER — Ambulatory Visit: Payer: Medicare HMO | Admitting: Radiation Oncology

## 2021-01-25 ENCOUNTER — Other Ambulatory Visit: Payer: Self-pay

## 2021-01-25 DIAGNOSIS — J9601 Acute respiratory failure with hypoxia: Secondary | ICD-10-CM | POA: Diagnosis not present

## 2021-01-25 DIAGNOSIS — C3410 Malignant neoplasm of upper lobe, unspecified bronchus or lung: Secondary | ICD-10-CM | POA: Diagnosis not present

## 2021-01-25 DIAGNOSIS — C3412 Malignant neoplasm of upper lobe, left bronchus or lung: Secondary | ICD-10-CM | POA: Diagnosis not present

## 2021-01-25 DIAGNOSIS — C3490 Malignant neoplasm of unspecified part of unspecified bronchus or lung: Secondary | ICD-10-CM | POA: Diagnosis not present

## 2021-01-30 ENCOUNTER — Ambulatory Visit
Admission: RE | Admit: 2021-01-30 | Discharge: 2021-01-30 | Disposition: A | Discharge status: Home / Self Care | Payer: Medicare HMO | Source: Ambulatory Visit | Attending: Radiation Oncology | Admitting: Radiation Oncology

## 2021-01-30 ENCOUNTER — Encounter: Payer: Self-pay | Admitting: Radiation Oncology

## 2021-01-30 ENCOUNTER — Ambulatory Visit: Payer: Medicare HMO | Admitting: Radiation Oncology

## 2021-01-30 ENCOUNTER — Other Ambulatory Visit: Payer: Self-pay

## 2021-01-30 DIAGNOSIS — C3490 Malignant neoplasm of unspecified part of unspecified bronchus or lung: Secondary | ICD-10-CM | POA: Diagnosis not present

## 2021-01-30 DIAGNOSIS — C3412 Malignant neoplasm of upper lobe, left bronchus or lung: Secondary | ICD-10-CM | POA: Diagnosis not present

## 2021-01-30 DIAGNOSIS — R918 Other nonspecific abnormal finding of lung field: Secondary | ICD-10-CM | POA: Diagnosis not present

## 2021-01-30 DIAGNOSIS — C3410 Malignant neoplasm of upper lobe, unspecified bronchus or lung: Secondary | ICD-10-CM | POA: Diagnosis not present

## 2021-01-30 DIAGNOSIS — Z87891 Personal history of nicotine dependence: Secondary | ICD-10-CM | POA: Diagnosis not present

## 2021-02-25 DIAGNOSIS — J9601 Acute respiratory failure with hypoxia: Secondary | ICD-10-CM | POA: Diagnosis not present

## 2021-02-26 ENCOUNTER — Encounter: Payer: Self-pay | Admitting: Nurse Practitioner

## 2021-02-26 ENCOUNTER — Other Ambulatory Visit: Payer: Self-pay

## 2021-02-26 ENCOUNTER — Ambulatory Visit (INDEPENDENT_AMBULATORY_CARE_PROVIDER_SITE_OTHER): Payer: Medicare HMO | Admitting: Nurse Practitioner

## 2021-02-26 VITALS — BP 110/66 | HR 76 | Temp 98.3°F | Ht 64.0 in | Wt 212.0 lb

## 2021-02-26 DIAGNOSIS — E782 Mixed hyperlipidemia: Secondary | ICD-10-CM | POA: Diagnosis not present

## 2021-02-26 DIAGNOSIS — J449 Chronic obstructive pulmonary disease, unspecified: Secondary | ICD-10-CM | POA: Diagnosis not present

## 2021-02-26 DIAGNOSIS — I10 Essential (primary) hypertension: Secondary | ICD-10-CM | POA: Diagnosis not present

## 2021-02-26 DIAGNOSIS — Z6836 Body mass index (BMI) 36.0-36.9, adult: Secondary | ICD-10-CM | POA: Diagnosis not present

## 2021-02-26 DIAGNOSIS — E039 Hypothyroidism, unspecified: Secondary | ICD-10-CM | POA: Diagnosis not present

## 2021-02-26 DIAGNOSIS — J300 Vasomotor rhinitis: Secondary | ICD-10-CM | POA: Diagnosis not present

## 2021-02-26 MED ORDER — FLUTICASONE PROPIONATE 50 MCG/ACT NA SUSP: 2.0000 | NASAL | 5 refills | Status: DC | PRN

## 2021-02-26 NOTE — Progress Notes (Signed)
Established Patient Office Visit  Subjective:  Patient ID: [name], female    DOB: [birth year]  Age: 76 y.o. MRN: [id number]  CC:  Chief Complaint  Patient presents with   Follow-up   Hypertension    HPI [name] presents for routine follow up. She denies new concerns or complaints today. She does see pulmonologist on routine basis after history of lung cancer. She does have regular lung cancer screenings which have been benign. Her blood pressure is well controlled. She has no new concerns or complains today.   Past Medical History:  Diagnosis Date   CAD (coronary artery disease)    Cancer ([place])    Dyslipidemia    GERD (gastroesophageal reflux disease)    HTN (hypertension)    Myocardial infarct (H[facility])    Non-small cell lung cancer ([place]) 08/2010   Stage IA, status post left upper lobectomy July 2012   Tobacco abuse     Past Surgical History:  Procedure Laterality Date   CARDIAC CATHETERIZATION  07/06/2010   see CABG report - pt sent to OR   CARDIOVASCULAR STRESS TEST  09/11/2010   R/S MV - normal perfusion in all regions, EF 46%, no scintigraphic evidence of inducible myocardial ischemia; global LV systolic function mildly reduced; no significant wall motion abnormalities noted; Exercise capacity 7 METS; EKG negative for ischemia; low risk study, no signifcant change from previous study 08/2003   CORONARY ARTERY BYPASS GRAFT  07/11/2010   LIMA to LAD; SVG to 2nd branch OM; SVG to posterior descending artery   LEFT VATS  09/17/2010   TEE WITHOUT CARDIOVERSION  07/06/2010   during emergent CABG surgery; 2-3+ mitral regurgitation    Family History  Problem Relation Age of Onset   Hypothyroidism Father    Heart attack Father    Aplastic anemia Maternal Grandfather    Stroke Paternal Grandfather     Social History   Socioeconomic History   Marital status: Married    Spouse name: Not on file   Number of children: Not on file   Years of education: Not on  file   Highest education level: Not on file  Occupational History   Not on file  Tobacco Use   Smoking status: Former    Packs/day: 1.00    Years: 58.00    Pack years: 58.00    Types: Cigarettes    Quit date: 03/21/2020    Years since quitting: 0.9   Smokeless tobacco: Never  Vaping Use   Vaping Use: Never used  Substance and Sexual Activity   Alcohol use: No    Alcohol/week: 0.0 standard drinks   Drug use: No   Sexual activity: Never  Other Topics Concern   Not on file  Social History Narrative   Not on file   Social Determinants of Health   Financial Resource Strain: Not on file  Food Insecurity: Not on file  Transportation Needs: Not on file  Physical Activity: Not on file  Stress: Not on file  Social Connections: Not on file  Intimate Partner Violence: Not on file    Outpatient Medications Prior to Visit  Medication Sig Dispense Refill   aspirin 81 MG tablet Take 81 mg by mouth daily.     Coenzyme Q10 300 MG CAPS Take by mouth.     furosemide (LASIX) 20 MG tablet Take 1 tablet (20 mg total) by mouth daily. 90 tablet 1   ibuprofen (ADVIL) 200 MG tablet Take 200 mg by mouth  as needed.     INCRUSE ELLIPTA 62.5 MCG/INH AEPB Inhale 1 puff into the lungs daily.     levothyroxine (SYNTHROID) 25 MCG tablet TAKE 0.5 TABLETS (12.5 MCG TOTAL) BY MOUTH DAILY. IN ADDITION TO THE 50 MCG. 45 tablet 1   levothyroxine (SYNTHROID) 50 MCG tablet TAKE 1 TABLET BY MOUTH EVERY DAY BEFORE BREAKFAST 90 tablet 0   metoprolol succinate (TOPROL-XL) 100 MG 24 hr tablet Take 1 tablet (100 mg total) by mouth daily. Take with or immediately following a meal. 90 tablet 1   Omega-3 Fatty Acids (FISH OIL) 1000 MG CAPS Take 2,000 mg by mouth daily.     rosuvastatin (CRESTOR) 40 MG tablet Take 1 tablet (40 mg total) by mouth daily. 90 tablet 3   fluticasone (FLONASE) 50 MCG/ACT nasal spray Place 1 spray into both nostrils as needed.     budesonide (PULMICORT) 0.5 MG/2ML nebulizer solution Inhale into  the lungs as needed. (Patient not taking: Reported on 12/28/2020)     No facility-administered medications prior to visit.    No Known Allergies  ROS Review of Systems  Constitutional:  Negative for activity change, appetite change, chills, fatigue and fever.  HENT:  Positive for congestion and rhinorrhea. Negative for postnasal drip, sinus pressure, sinus pain, sneezing and sore throat.   Eyes: Negative.   Respiratory:  Positive for shortness of breath and wheezing. Negative for cough and chest tightness.        Intermittent and well managed with current treatment regimen.   Cardiovascular:  Negative for chest pain and palpitations.  Gastrointestinal:  Negative for abdominal pain, constipation, diarrhea, nausea and vomiting.  Endocrine: Negative for cold intolerance, heat intolerance, polydipsia and polyuria.  Genitourinary:  Negative for dyspareunia, dysuria, flank pain, frequency and urgency.  Musculoskeletal:  Negative for arthralgias, back pain and myalgias.  Skin:  Negative for rash.  Allergic/Immunologic: Negative for environmental allergies.  Neurological:  Negative for dizziness, weakness and headaches.  Hematological:  Negative for adenopathy.  Psychiatric/Behavioral:  The patient is not nervous/anxious.      Objective:    Physical Exam Vitals and nursing note reviewed.  Constitutional:      Appearance: Normal appearance. She is well-developed.  HENT:     Head: Normocephalic and atraumatic.     Nose: Nose normal.     Mouth/Throat:     Mouth: Mucous membranes are moist.     Pharynx: Oropharynx is clear.  Eyes:     Extraocular Movements: Extraocular movements intact.     Conjunctiva/sclera: Conjunctivae normal.     Pupils: Pupils are equal, round, and reactive to light.  Neck:     Vascular: No carotid bruit.  Cardiovascular:     Rate and Rhythm: Normal rate and regular rhythm.     Pulses: Normal pulses.     Heart sounds: Normal heart sounds.  Pulmonary:      Effort: Pulmonary effort is normal.     Breath sounds: Normal breath sounds.  Abdominal:     Palpations: Abdomen is soft.  Musculoskeletal:        General: Normal range of motion.     Cervical back: Normal range of motion and neck supple.  Lymphadenopathy:     Cervical: No cervical adenopathy.  Skin:    General: Skin is warm and dry.     Capillary Refill: Capillary refill takes less than 2 seconds.  Neurological:     General: No focal deficit present.     Mental Status: She is alert  and oriented to person, place, and time.  Psychiatric:        Mood and Affect: Mood normal.        Behavior: Behavior normal.        Thought Content: Thought content normal.        Judgment: Judgment normal.    Today's Vitals   02/26/21 1148  BP: 110/66  Pulse: 76  Temp: 98.3 F (36.8 C)  SpO2: 90%  Weight: 212 lb (96.2 kg)  Height: '5\' 4"'  (1.626 m)   Body mass index is 36.39 kg/m.   Wt Readings from Last 3 Encounters:  02/26/21 212 lb (96.2 kg)  12/28/20 211 lb 12.8 oz (96.1 kg)  12/28/20 211 lb (95.7 kg)     Health Maintenance Due  Topic Date Due   Hepatitis C Screening  Never done   Zoster Vaccines- Shingrix (2 of 2) 08/09/2019   Pneumonia Vaccine 50+ Years old (2 - PCV) 01/28/2020    There are no preventive care reminders to display for this patient.  Lab Results  Component Value Date   TSH 4.650 (H) 10/25/2020   Lab Results  Component Value Date   WBC 8.4 10/25/2020   HGB 13.4 10/25/2020   HCT 44.0 10/25/2020   MCV 86 10/25/2020   PLT 250 10/25/2020   Lab Results  Component Value Date   NA 147 (H) 10/25/2020   K 4.8 10/25/2020   CO2 36 (H) 10/25/2020   GLUCOSE 97 10/25/2020   BUN 11 10/25/2020   CREATININE 0.72 10/25/2020   BILITOT 0.3 10/25/2020   ALKPHOS 78 10/25/2020   AST 47 (H) 10/25/2020   ALT 52 (H) 10/25/2020   PROT 6.8 10/25/2020   ALBUMIN 4.1 10/25/2020   CALCIUM 9.8 10/25/2020   EGFR 88 10/25/2020   GFR 36.05 (L) 06/29/2020   Lab Results   Component Value Date   CHOL 112 10/25/2020   Lab Results  Component Value Date   HDL 47 10/25/2020   Lab Results  Component Value Date   LDLCALC 44 10/25/2020   Lab Results  Component Value Date   TRIG 114 10/25/2020   Lab Results  Component Value Date   CHOLHDL 2.4 10/25/2020   Lab Results  Component Value Date   HGBA1C 6.2 (H) 10/25/2020      Assessment & Plan:  1. Hypertension, unspecified type Stable. Continue bp medication as prescribed   2. Acquired hypothyroidism Recent thyroid panel stable. Continue levothyroxine as prescribed   3. Mixed hyperlipidemia Recent lipid panel normal. Continue rosuvastatin as prescribed   4. Vasomotor rhinitis Continue fluticasone as prescribed. Refills provided today.  - fluticasone (FLONASE) 50 MCG/ACT nasal spray; Place 2 sprays into both nostrils as needed.  Dispense: 16 g; Refill: 5  5. COPD GOLD III Continue regular visits with pulmonology as scheduled.   6. Body mass index (BMI) of 36.0-36.9 in adult Discussed lowering calorie intake to 1500 calories per day and incorporating exercise into daily routine to help lose weight.    Problem List Items Addressed This Visit       Cardiovascular and Mediastinum   HTN (hypertension) - Primary (Chronic)     Respiratory   COPD GOLD III   Relevant Medications   fluticasone (FLONASE) 50 MCG/ACT nasal spray   Vasomotor rhinitis   Relevant Medications   fluticasone (FLONASE) 50 MCG/ACT nasal spray     Endocrine   Acquired hypothyroidism     Other   Mixed hyperlipidemia   Body mass index (BMI)  of 36.0-36.9 in adult    Meds ordered this encounter  Medications   fluticasone (FLONASE) 50 MCG/ACT nasal spray    Sig: Place 2 sprays into both nostrils as needed.    Dispense:  16 g    Refill:  5    Order Specific Question:   Supervising Provider    Answer:   Beatrice Lecher D [[id number]]    Follow-up: Return in about 4 months (around 06/26/2021) for need immunization  report from [facility] , blood pressure, med refills.    [name], NP

## 2021-03-04 DIAGNOSIS — Z6836 Body mass index (BMI) 36.0-36.9, adult: Secondary | ICD-10-CM | POA: Insufficient documentation

## 2021-03-04 DIAGNOSIS — J300 Vasomotor rhinitis: Secondary | ICD-10-CM | POA: Insufficient documentation

## 2021-03-06 DIAGNOSIS — M9901 Segmental and somatic dysfunction of cervical region: Secondary | ICD-10-CM | POA: Diagnosis not present

## 2021-03-06 DIAGNOSIS — M542 Cervicalgia: Secondary | ICD-10-CM | POA: Diagnosis not present

## 2021-03-06 DIAGNOSIS — M9903 Segmental and somatic dysfunction of lumbar region: Secondary | ICD-10-CM | POA: Diagnosis not present

## 2021-03-06 DIAGNOSIS — M546 Pain in thoracic spine: Secondary | ICD-10-CM | POA: Diagnosis not present

## 2021-03-06 DIAGNOSIS — M62830 Muscle spasm of back: Secondary | ICD-10-CM | POA: Diagnosis not present

## 2021-03-06 DIAGNOSIS — M545 Low back pain, unspecified: Secondary | ICD-10-CM | POA: Diagnosis not present

## 2021-03-06 DIAGNOSIS — M62838 Other muscle spasm: Secondary | ICD-10-CM | POA: Diagnosis not present

## 2021-03-06 DIAGNOSIS — M9902 Segmental and somatic dysfunction of thoracic region: Secondary | ICD-10-CM | POA: Diagnosis not present

## 2021-03-07 ENCOUNTER — Encounter: Payer: Self-pay | Admitting: Radiation Oncology

## 2021-03-07 DIAGNOSIS — M9901 Segmental and somatic dysfunction of cervical region: Secondary | ICD-10-CM | POA: Diagnosis not present

## 2021-03-07 DIAGNOSIS — M546 Pain in thoracic spine: Secondary | ICD-10-CM | POA: Diagnosis not present

## 2021-03-07 DIAGNOSIS — M62830 Muscle spasm of back: Secondary | ICD-10-CM | POA: Diagnosis not present

## 2021-03-07 DIAGNOSIS — M9902 Segmental and somatic dysfunction of thoracic region: Secondary | ICD-10-CM | POA: Diagnosis not present

## 2021-03-07 DIAGNOSIS — M9903 Segmental and somatic dysfunction of lumbar region: Secondary | ICD-10-CM | POA: Diagnosis not present

## 2021-03-07 DIAGNOSIS — M542 Cervicalgia: Secondary | ICD-10-CM | POA: Diagnosis not present

## 2021-03-07 DIAGNOSIS — M62838 Other muscle spasm: Secondary | ICD-10-CM | POA: Diagnosis not present

## 2021-03-07 DIAGNOSIS — M545 Low back pain, unspecified: Secondary | ICD-10-CM | POA: Diagnosis not present

## 2021-03-11 NOTE — Progress Notes (Signed)
Radiation Oncology         (336) [phone] ________________________________  Name: [name] MRN: [id number]  Date: 03/12/2021  DOB: [birth year]  Follow-Up Visit Note  CC: [name], NP  Melrose Nakayama, *    ICD-10-CM   1. Malignant neoplasm of upper lobe of left lung ([place])  C34.12 CT CHEST WO CONTRAST      Diagnosis:  PET positive pulmonary nodule presenting in the left lower lobe, clinical stage IA2 (T1b, N0, M0)   (37mm subpleural nodule in the left lower lobe, suspicious for primary bronchogenic carcinoma)   Interval Since Last Radiation: 1 month and 10 days   Intent: Curative  Radiation Treatment Dates: 01/16/2021 through 01/30/2021 Site Technique Total Dose (Gy) Dose per Fx (Gy) Completed Fx Beam Energies  Lung, Left: Lung_Lt IMRT 55/55 11 5/5 6XFFF    Narrative:  The patient returns today for routine follow-up. The patient tolerated radiation therapy relatively well other than reports of ongoing SOB and cough. She reported no change in her SOB, but noticed a slight increase in her chronic cough during treatment. She continues to use her supplemental oxygen occasionally at home, and uses this every night when she sleeps.        Otherwise, no significant interval history since the patient was last seen for her initial consultation.           She denies any new issues at this time.  She continues to have some mild fatigue but otherwise has recovered well from her SBRT.  She has mild cough in the early morning but none later in the day.  She denies any pain within the chest area or hemoptysis.                Allergies:  has No Known Allergies.  Meds: Current Outpatient Medications  Medication Sig Dispense Refill   aspirin 81 MG tablet Take 81 mg by mouth daily.     Coenzyme Q10 300 MG CAPS Take by mouth.     dextromethorphan-guaiFENesin (MUCINEX DM) 30-600 MG 12hr tablet Take 1 tablet by mouth as needed for cough.     fluticasone (FLONASE) 50 MCG/ACT  nasal spray Place 2 sprays into both nostrils as needed. 16 g 5   furosemide (LASIX) 20 MG tablet Take 1 tablet (20 mg total) by mouth daily. 90 tablet 1   ibuprofen (ADVIL) 200 MG tablet Take 200 mg by mouth as needed.     levothyroxine (SYNTHROID) 25 MCG tablet TAKE 0.5 TABLETS (12.5 MCG TOTAL) BY MOUTH DAILY. IN ADDITION TO THE 50 MCG. 45 tablet 1   levothyroxine (SYNTHROID) 50 MCG tablet TAKE 1 TABLET BY MOUTH EVERY DAY BEFORE BREAKFAST 90 tablet 0   metoprolol succinate (TOPROL-XL) 100 MG 24 hr tablet Take 1 tablet (100 mg total) by mouth daily. Take with or immediately following a meal. 90 tablet 1   Omega-3 Fatty Acids (FISH OIL) 1000 MG CAPS Take 2,000 mg by mouth daily.     rosuvastatin (CRESTOR) 40 MG tablet Take 1 tablet (40 mg total) by mouth daily. 90 tablet 3   budesonide (PULMICORT) 0.5 MG/2ML nebulizer solution Inhale into the lungs as needed. (Patient not taking: Reported on 12/28/2020)     INCRUSE ELLIPTA 62.5 MCG/INH AEPB Inhale 1 puff into the lungs daily. (Patient not taking: Reported on 03/12/2021)     No current facility-administered medications for this encounter.    Physical Findings: The patient is in no acute distress. Patient is alert  and oriented.  height is 5\' 4"  (1.626 m) and weight is 212 lb 4 oz (96.3 kg). Her temporal temperature is 96.8 F (36 C) (abnormal). Her blood pressure is 137/73 and her pulse is 74. Her respiration is 20 and oxygen saturation is 92%. .  No significant changes. Lungs are clear to auscultation bilaterally. Heart has regular rate and rhythm. No palpable cervical, supraclavicular, or axillary adenopathy. Abdomen soft, non-tender, normal bowel sounds.    Lab Findings: Lab Results  Component Value Date   WBC 8.4 10/25/2020   HGB 13.4 10/25/2020   HCT 44.0 10/25/2020   MCV 86 10/25/2020   PLT 250 10/25/2020    Radiographic Findings: No results found.  Impression: PET positive pulmonary nodule presenting in the left lower lobe,  clinical stage IA2 (T1b, N0, M0)   (19mm subpleural nodule in the left lower lobe, suspicious for primary bronchogenic carcinoma)   She tolerated her SBRT well and only some residual fatigue at this time.  Plan: She will undergo a CT scan of the chest in 3 months and follow-up with me soon afterward for clinical exam and to review results of scan.   Okay great, that I will have barbecue describe anything that looks good _______________________  [name], PhD, MD  This document serves as a record of services personally performed by Gery Pray, MD. It was created on his behalf by Roney Mans, a trained medical scribe. The creation of this record is based on the scribe's personal observations and the provider's statements to them. This document has been checked and approved by the attending provider.

## 2021-03-11 NOTE — Progress Notes (Incomplete)
°  Radiation Oncology         (336) [phone] ________________________________  Patient Name: [name] MRN: [id number] DOB: [birth year] Referring Physician: Modesto Charon (Profile Not Attached) Date of Service: 01/30/2021 [facility] [place]                                                        End Of Treatment Note  Diagnoses: R91.1-Solitary pulmonary nodule  Cancer Staging: PET positive pulmonary nodule presenting in the left lower lobe, clinical stage IA2 (T1b, N0, M0)   (52mm subpleural nodule in the left lower lobe, suspicious for primary bronchogenic carcinoma)  Intent: Curative  Radiation Treatment Dates: 01/16/2021 through 01/30/2021 Site Technique Total Dose (Gy) Dose per Fx (Gy) Completed Fx Beam Energies  Lung, Left: Lung_Lt IMRT 55/55 11 5/5 6XFFF   Narrative: The patient tolerated radiation therapy relatively well. She reports ongoing SOB and cough. She reports no change in her SOB, but reports a noticed slight increase in her chronic cough. She does use supplemental oxygen occasionally at home, and uses this every night when she sleeps.  Plan: The patient will follow-up with radiation oncology in one month. Plan on repeating chest CT scan in 3 to 4 months.  ________________________________________________ -----------------------------------  [name], PhD, MD  This document serves as a record of services personally performed by Gery Pray, MD. It was created on his behalf by Roney Mans, a trained medical scribe. The creation of this record is based on the scribe's personal observations and the provider's statements to them. This document has been checked and approved by the attending provider.

## 2021-03-12 ENCOUNTER — Encounter: Payer: Self-pay | Admitting: Radiation Oncology

## 2021-03-12 ENCOUNTER — Other Ambulatory Visit: Payer: Self-pay

## 2021-03-12 ENCOUNTER — Ambulatory Visit
Admission: RE | Admit: 2021-03-12 | Discharge: 2021-03-12 | Disposition: A | Discharge status: Home / Self Care | Payer: Medicare HMO | Source: Ambulatory Visit | Attending: Radiation Oncology | Admitting: Radiation Oncology

## 2021-03-12 VITALS — BP 137/73 | HR 74 | Temp 96.8°F | Resp 20 | Ht 64.0 in | Wt 212.2 lb

## 2021-03-12 DIAGNOSIS — Z7982 Long term (current) use of aspirin: Secondary | ICD-10-CM | POA: Insufficient documentation

## 2021-03-12 DIAGNOSIS — C3412 Malignant neoplasm of upper lobe, left bronchus or lung: Secondary | ICD-10-CM

## 2021-03-12 DIAGNOSIS — Z923 Personal history of irradiation: Secondary | ICD-10-CM | POA: Diagnosis not present

## 2021-03-12 DIAGNOSIS — Z79899 Other long term (current) drug therapy: Secondary | ICD-10-CM | POA: Diagnosis not present

## 2021-03-12 HISTORY — DX: Personal history of irradiation: Z92.3

## 2021-03-12 NOTE — Progress Notes (Signed)
[name] is here today for follow up post radiation to the lung.  Lung Side: left  Completed radiation treatment on: 01/30/2021  Does the patient complain of any of the following: Pain:denies Shortness of breath w/wo exertion: denies Cough: productive cough with clear phlegm, generally in the morning Hemoptysis: denies Pain with swallowing: denies Swallowing/choking concerns: denies Appetite: good Energy Level: moderate Post radiation skin Changes: denies    Additional comments if applicable: none  Vitals:   03/12/21 1146  BP: 137/73  Pulse: 74  Resp: 20  Temp: (!) 96.8 F (36 C)  TempSrc: Temporal  SpO2: 92%  Weight: 212 lb 4 oz (96.3 kg)  Height: 5\' 4"  (1.626 m)

## 2021-03-16 ENCOUNTER — Ambulatory Visit: Payer: Medicare HMO | Admitting: Internal Medicine

## 2021-03-27 ENCOUNTER — Encounter: Payer: Self-pay | Admitting: Internal Medicine

## 2021-03-27 ENCOUNTER — Other Ambulatory Visit: Payer: Self-pay

## 2021-03-27 ENCOUNTER — Ambulatory Visit: Payer: Medicare HMO | Admitting: Internal Medicine

## 2021-03-27 DIAGNOSIS — J449 Chronic obstructive pulmonary disease, unspecified: Secondary | ICD-10-CM

## 2021-03-27 DIAGNOSIS — H04123 Dry eye syndrome of bilateral lacrimal glands: Secondary | ICD-10-CM | POA: Diagnosis not present

## 2021-03-27 DIAGNOSIS — J9612 Chronic respiratory failure with hypercapnia: Secondary | ICD-10-CM

## 2021-03-27 DIAGNOSIS — H524 Presbyopia: Secondary | ICD-10-CM | POA: Diagnosis not present

## 2021-03-27 DIAGNOSIS — J9611 Chronic respiratory failure with hypoxia: Secondary | ICD-10-CM

## 2021-03-27 DIAGNOSIS — H26492 Other secondary cataract, left eye: Secondary | ICD-10-CM | POA: Diagnosis not present

## 2021-03-27 DIAGNOSIS — H18593 Other hereditary corneal dystrophies, bilateral: Secondary | ICD-10-CM | POA: Diagnosis not present

## 2021-03-27 MED ORDER — ALBUTEROL SULFATE HFA 108 (90 BASE) MCG/ACT IN AERS: INHALATION_SPRAY | RESPIRATORY_TRACT | Status: DC

## 2021-03-27 NOTE — Patient Instructions (Signed)
Only use your albuterol as a rescue medication to be used if you can't catch your breath by resting or doing a relaxed purse lip breathing pattern.  - The less you use it, the better it will work when you need it. - Ok to use up to 2 puffs  every 4 hours if you must but call for immediate appointment if use goes up over your usual need - Don't leave home without it !!  (think of it like the spare tire for your car)   Ok to try albuterol 15 min before an activity (on alternating days)  that you know would usually make you short of breath and see if it makes any difference and if makes none then don't take albuterol after activity unless you can't catch your breath as this means it's the resting that helps, not the albuterol.  Make sure you check your oxygen saturation  AT  your highest level of activity (not after you stop)   to be sure it stays over 90% and adjust  02 flow upward to maintain this level if needed but remember to turn it back to previous settings when you stop (to conserve your supply).    Pulmonary follow up is as needed

## 2021-03-27 NOTE — Assessment & Plan Note (Signed)
Quit smoking 03/21/20  - Spirometry 05/18/15   FEV1 0.8 (39%)  Ratio 0.56   As I explained to this patient in detail:  although there is significant  copd present, it may not be clinically relevant:   it does not appear to be limiting activity tolerance any more than a set of worn tires limits someone from driving a car  around a parking lot.  That is to say:   this pt is so sedentary I don't recommend aggressive pulmonary rx at this point unless limiting symptoms arise or acute exacerbations become as issue, neither of which is the case now.  I asked the patient to contact this office at any time in the future should either of these problems arise.    Pulmonary f/u can be prn

## 2021-03-27 NOTE — Assessment & Plan Note (Signed)
HC03   06/29/2020  = 36 -  06/29/2020   Walked RA  2 laps @ approx 259ft each @ relatively pace  stopped due to end of study,  no sob but sats 86%    Advised again: Make sure you check your oxygen saturation  AT  your highest level of activity (not after you stop)   to be sure it stays over 90% and adjust  02 flow upward to maintain this level if needed but remember to turn it back to previous settings when you stop (to conserve your supply).          Each maintenance medication was reviewed in detail including emphasizing most importantly the difference between maintenance and prns and under what circumstances the prns are to be triggered using an action plan format where appropriate.  Total time for H and P, chart review, counseling, reviewing 02  device(s) and generating customized AVS unique to this office visit / same day charting = 25 min

## 2021-03-27 NOTE — Progress Notes (Addendum)
[name], female    DOB: Sep 08, 1945   MRN: [id number]   Brief patient profile:  75 yowf active smoker s/p LULobectomy 2012 stage 1A [place] s adjuvant rx  temporarily quit smoking 03/21/20 with admit to [facility] / acute resp failure:   05/05/2020 Discharge Date:  05/22/2020 Consultants: Pulmonology, GI, wound care, cardiology  Principal Problem: Acute respiratory failure with hypoxia and hypercapnia Active Problems: Influenza A Type 2 MI (myocardial infarction) Metabolic encephalopathy Acute systolic congestive heart failure Coronary artery disease involving native coronary artery of native heart Dilated cardiomyopathy Acute blood loss anemia COPD with acute exacerbation Resolved Problems: Acute respiratory failure with hypercapnia  Hospital Course: Tinia Oravec is a 76 y.o. female who presented with complaints of shortness of breath and altered mental status. Was found to have acute hypercapnic respiratory failure was placed on BiPAP in the ER. Patient had been tested positive for the flu. Patient required intubation and was admitted to ICU. Critical care was consulted. She was started on Tamiflu for influenza. Treatment for diabetes and hypothyroidism continued. She was found to have acute on chronic systolic heart failure as well. Patient has a cardiologist [place] where she was from where she will need to follow-up at discharge. Patient had came here on a trip to [place] from [place] when this started happening. She was found to be in atrial fibrillation and placed on amiodarone and beta-blocker temporarily. Though these were held briefly during the times when she had low blood pressures. Patient also was found to have anemia which required transfusion. Has anemia was found to be iron deficiency. GI saw the patient and felt endoscopic evaluation would be good when she is medically optimized. Patient was stabilized and transferred to the floor. Her mental status  had improved very slowly her oxygenation requirements have improved as well. She has history of lung cancer and lobectomy in the past and she continued to smoke prior to this admission. She is advised to stop smoking and she will need to follow-up with pulmonology outpatient. She has improved significantly now after prolonged course. At this point, she is on 3 L nasal cannula and has been set up for home O2. Patient would like to be discharged and will be discharged home in improved condition to follow-up closely with PCP cardiology and GI and pulmonology in the near future  Physical exam: No apparent distress awake alert and oriented x3 Regular rate and rhythm Chest clear to auscultation bilaterally Abdomen soft nontender nondistended  Pertinent Labs and Studies: Recent Labs  05/20/20 0520 05/21/20 0832 05/22/20 0302  WBC 18.0* 15.2* 16.4*  HGB 7.3* 7.6* 8.7*  HCT 25.0* 26.4* 29.4*  LABPLAT 444* 424* 394*  MCV 96.2* 96.4* 93.9  NEUTOPHILPCT 79.40 -- 82.00  LYMPHOPCT 9.90 -- 8.80  MONOPCT 8.30 -- 6.80  BASOPCT 0.20 -- 0.20  EOSPCT 0.10* -- 0.10*   Recent Labs  05/20/20 0520 05/21/20 0058  NA 141 141  K 3.4* 3.8  CL 99 99  CO2 >40* >40*  BUN 11 13  CREATININE 1.08* 1.24*  GLUCOSE 116* 164*  EGFRAA 59* 50*  EGFRNONAA 51* 43*  CALCIUM 8.4 8.5   No results for input(s): PROTIME, INR, PTT in the last 72 hours. No results for input(s): AST, ALT, BILITOT, BILIDIR, ALKPHOS, LIPASE, AMYLASE, ALBUMIN, AMMONIA in the last 72 hours. Recent Labs  05/20/20 0523 05/21/20 0058  BNP 123.2* 87.0     ID and Culture Results: Recent Labs  05/21/20 0058  GLUCOSE  164*    additional hx:  MI in 2012 and underwent emergent CABG.  She was noted to have a left upper lobe lung mass.  She had a left upper lobectomy a couple of months after her CABG.  The lung lesion turned out to be a stage Ia non-small cell carcinoma.  She did not require any adjuvant therapy         History of  Present Illness  06/29/2020  Pulmonary/ 1st office eval/Saleemah Mollenhauer  Chief Complaint  Patient presents with   Pulmonary Consult     Referred by [name], NP. Pt with hx of left VATS- Dr Roxan Hockey 2012. She states hospitalized in March 2022 with PNA, influenza and UTI. She states remote hx of taking Amiodarone.   Dyspnea:  Strolling ok / can do [employer]/ no HC parking  Cough: none  Sleep: bed is flat / on side  SABA use: prn not using  Rec Make sure you check your oxygen saturation at your highest level of activity to be sure it stays over 90% and keep track of it at least once a week, more often if breathing getting worse, and let me know if losing ground.  Add:  V/q and venous doppler ordered 06/30/20 > neg   09/11/2020  f/u ov/Ingra Rother re:  doe/ 02 dep resp failure s/p Influenza A 04/2020 in [place] on way back from [place]  - has resumed smoking since last ov per husband though she downplays this Chief Complaint  Patient presents with   Follow-up    Patient is here for PFT review and does not have concerns with her breathing.  Dyspnea:  still does shopping at [employer] and [employer] not wearing 02  Cough: none  Sleeping: flat bed / one pillow / ends up in recliner due to toss and turning  SABA use: not easing  02: sleeps on 3 lpm  Covid status:   no vaccination Rec Make sure you check your oxygen saturation  at your highest level of activity  to be sure it stays over 88% and adjust  02 flow upward to maintain this level if needed but remember to turn it back to previous settings when you stop (to conserve your supply).  Let me know if ADAPT is not helpful with your travel plans or your equipment    03/27/2021  f/u ov/Byrant Valent re: GOLD 3   maint on no inhalers/ 02 hs and prn daytime   Chief Complaint  Patient presents with   Follow-up    Breathing is unchanged. She has prod cough with clear sputum in the am.   Dyspnea:  pushing cart [employer]/ uses HC parking s 02 Cough: rattle worse in ams > clear mucus   Sleeping: freq in recliner but starts in bed 45 degree no change pattern over last sev years  SABA use: not using but does have it  02: 2lpm hs / does not check with ex  Covid status:   never vax    No obvious day to day or daytime variability or assoc   purulent sputum or mucus plugs or hemoptysis or cp or chest tightness, subjective wheeze or overt sinus or hb symptoms.   Sleeping  without nocturnal   exacerbation  of respiratory  c/o's or need for noct saba. Also denies any obvious fluctuation of symptoms with weather or environmental changes or other aggravating or alleviating factors except as outlined above   No unusual exposure hx or h/o childhood pna/ asthma or knowledge  of premature birth.  Current Allergies, Complete Past Medical History, Past Surgical History, Family History, and Social History were reviewed in [employer] record.  ROS  The following are not active complaints unless bolded Hoarseness, sore throat, dysphagia, dental problems, itching, sneezing,  nasal congestion or discharge of excess mucus or purulent secretions, ear ache,   fever, chills, sweats, unintended wt loss or wt gain, classically pleuritic or exertional cp,  orthopnea pnd or arm/hand swelling  or leg swelling, presyncope, palpitations, abdominal pain, anorexia, nausea, vomiting, diarrhea  or change in bowel habits or change in bladder habits, change in stools or change in urine, dysuria, hematuria,  rash, arthralgias, visual complaints, headache, numbness, weakness or ataxia or problems with walking or coordination,  change in mood or  memory.        Current Meds  Medication Sig   aspirin 81 MG tablet Take 81 mg by mouth daily.   Coenzyme Q10 300 MG CAPS Take by mouth.   dextromethorphan-guaiFENesin (MUCINEX DM) 30-600 MG 12hr tablet Take 1 tablet by mouth as needed for cough.   fluticasone (FLONASE) 50 MCG/ACT nasal spray Place 2 sprays into both nostrils as needed.   furosemide  (LASIX) 20 MG tablet Take 1 tablet (20 mg total) by mouth daily.   ibuprofen (ADVIL) 200 MG tablet Take 200 mg by mouth as needed.   levothyroxine (SYNTHROID) 25 MCG tablet TAKE 0.5 TABLETS (12.5 MCG TOTAL) BY MOUTH DAILY. IN ADDITION TO THE 50 MCG.   levothyroxine (SYNTHROID) 50 MCG tablet TAKE 1 TABLET BY MOUTH EVERY DAY BEFORE BREAKFAST   metoprolol succinate (TOPROL-XL) 100 MG 24 hr tablet Take 1 tablet (100 mg total) by mouth daily. Take with or immediately following a meal.   Omega-3 Fatty Acids (FISH OIL) 1000 MG CAPS Take 2,000 mg by mouth daily.   rosuvastatin (CRESTOR) 40 MG tablet Take 1 tablet (40 mg total) by mouth daily.            Past Medical History:  Diagnosis Date   CAD (coronary artery disease)    Cancer ([place])    Dyslipidemia    GERD (gastroesophageal reflux disease)    HTN (hypertension)    Myocardial infarct ([facility])    Non-small cell lung cancer ([place]) 08/2010   Stage IA, status post left upper lobectomy July 2012   Tobacco abuse       Objective:       03/27/2021         213   09/11/20 214 lb 12.8 oz (97.4 kg)  07/27/20 213 lb 3.2 oz (96.7 kg)  07/24/20 213 lb 9.6 oz (96.9 kg)     Vital signs reviewed  03/27/2021  - Note at rest 02 sats  94% on RA   General appearance:    Amb wf nad     HEENT : pt wearing mask not removed for exam due to covid - 19 concerns.    NECK :  without JVD/Nodes/TM/ nl carotid upstrokes bilaterally   LUNGS: no acc muscle use,  Mild barrel  contour chest wall with bilateral  Distant bs s audible wheeze and  without cough on insp or exp maneuvers  and mild  Hyperresonant  to  percussion bilaterally     CV:  RRR  no s3 or murmur or increase in P2, and no edema   ABD:  soft and nontender with pos end  insp Hoover's  in the supine position. No bruits or organomegaly appreciated, bowel sounds nl  MS:   Nl gait/  ext warm without deformities, calf tenderness, cyanosis or clubbing No obvious joint restrictions   SKIN: warm and dry  without lesions    NEURO:  alert, approp, nl sensorium with  no motor or cerebellar deficits apparent.               Assessment

## 2021-03-28 DIAGNOSIS — J9601 Acute respiratory failure with hypoxia: Secondary | ICD-10-CM | POA: Diagnosis not present

## 2021-03-30 ENCOUNTER — Encounter: Payer: Self-pay | Admitting: Cardiovascular Disease

## 2021-03-30 ENCOUNTER — Ambulatory Visit: Payer: Medicare HMO | Admitting: Cardiovascular Disease

## 2021-03-30 ENCOUNTER — Other Ambulatory Visit: Payer: Self-pay

## 2021-03-30 DIAGNOSIS — I480 Paroxysmal atrial fibrillation: Secondary | ICD-10-CM

## 2021-03-30 DIAGNOSIS — J439 Emphysema, unspecified: Secondary | ICD-10-CM

## 2021-03-30 DIAGNOSIS — I2583 Coronary atherosclerosis due to lipid rich plaque: Secondary | ICD-10-CM | POA: Diagnosis not present

## 2021-03-30 DIAGNOSIS — I10 Essential (primary) hypertension: Secondary | ICD-10-CM | POA: Diagnosis not present

## 2021-03-30 DIAGNOSIS — E668 Other obesity: Secondary | ICD-10-CM

## 2021-03-30 DIAGNOSIS — I2510 Atherosclerotic heart disease of native coronary artery without angina pectoris: Secondary | ICD-10-CM | POA: Diagnosis not present

## 2021-03-30 DIAGNOSIS — Z951 Presence of aortocoronary bypass graft: Secondary | ICD-10-CM

## 2021-03-30 DIAGNOSIS — Z85118 Personal history of other malignant neoplasm of bronchus and lung: Secondary | ICD-10-CM

## 2021-03-30 DIAGNOSIS — E785 Hyperlipidemia, unspecified: Secondary | ICD-10-CM | POA: Diagnosis not present

## 2021-03-30 DIAGNOSIS — Z720 Tobacco use: Secondary | ICD-10-CM | POA: Diagnosis not present

## 2021-03-30 NOTE — Progress Notes (Signed)
Zimmerman ID: [name], female   DOB: January 18, 1946, 76 y.o.   MRN: [id number] n       HPI: [name] is a 76 y.o. female who presents to Megan office today for a 4 month follow up cardiology evaluation.  [name] suffered initial anterior wall myocardial infarction with ventricular fibrillation arrest requiring resuscitation and PTCA of her LAD in 1997. On 07/05/2010 she presented with increasing shortness of breath and was found to have critical life threatening anatomy with 99% left main equivalent disease.   Later that afternoon she underwent emergent CABG revascularization surgery by Dr. Roxan Hockey with a LIMA to Megan LAD, a vein graft to Megan second branch of Megan obtuse marginal vessel, and vein graft to Megan PDA.  Chest x-rays disclosed a left upper lung nodule and ultimately this proved to be adenocarcinoma for which she ultimately underwent upper lobe resection in July 2012. Additional problems include hypertension, hyperlipidemia, obesity and probable metabolic syndrome. A nuclear perfusion study in July 2012 showed a post stress ejection fraction of 46% but otherwise fairly normal perfusion. EF at cath was 35% prior to surgery.   In December 2013 an NMR lipoprofile revealed that despite an LDL of 50 she had increased LDL particle #1312, and increased small LDL particle number at 761. Her insulin resistance was significantly elevated at 70. HDL particle number was low at 26.8 and a triglyceride of 152.evaluation.  An echo Doppler study on 08/04/2012 showed an ejection fraction at 50 - 55%. She had mild LVH. There was grade 1 diastolic dysfunction with distal anterior, anteroseptal and apical LAD scar. There is mild tricuspid regurgitation and mild mitral regurgitation. PA pressure estimated 32 mm.  A LexiScan study in 07/2012 was low risk. Ejection fraction was 56%. There is no evidence for ischemia. She had laboratory which showed an LDL particle number at 830 with a target LDL of 38  HDL 42 triglycerides 170. Her insulin resistance was elevated at 60. Hemoglobin A1c was 5.7.  When I last saw her in October 2017 she was still smoking one pack of cigarettes every 3 day.  She sees Dr. Roxan Hockey on a yearly basis and  saw him in August 2017..  A follow-up chest CT was reviewed and she was stable without evidence of local recurrence or metastatic disease status post left upper lobectomy for stage IA non-small cell carcinoma.  Over Megan past year, she denies any episodes of recurrent chest pain or awareness of palpitations.  She underwent a nuclear perfusion study on 11/05/2016, which revealed normal perfusion and function without scar or ischemia. She saw Dr. Roxan Hockey in August 2018 and during that evaluation, she was significantly hypertensive with a blood pressure 170/87.  She was on Toprol-XL 100 mg, rosuvastatin 40 mg, and levothyroxine 50 g.  Unfortunately she still smokes and is smoking anywhere from 1-3/4 of a pack daily.  She had laboratory in May and on one chemistry evaluation.  Her potassium was 5.5, a follow-up evaluation showed normal potassium.  Cholesterol was 123, HDL 39, and LDL 59.    When I saw her in October 2018, I initiated amlodipine 5 mg.  She was undergoing low dose CT imaging for CA.  We again discussed complete smoking cessation.    I saw her in January 2019. Over Megan prior year she hadshe has been going back and forth between [place] and [place].    She denies any significant shortness of breath.  She has not been very active.  Unfortunately she continues to smoke cigarettes and typically a pack of cigarettes may last 2 to 3 days. She has seen Dr. Roxan Hockey for cardiothoracic surgery follow-up in September 2019.  Laboratory in December 2019 showed an LDL cholesterol at 44, HDL 41, total cholesterol 114 and triglycerides 146.  She continued to be on amlodipine 5 mg and Toprol-XL 100 mg for hypertension and her CAD and  rosuvastatin 40 mg for  hyperlipidemia in addition to omega-3 fatty acid.  She has hypothyroidism on levothyroxine.  She continues to be on daily aspirin.    She was evaluated in January 2020 at which time she remained stable from a cardiac standpoint.  She had seen Dr. Roxan Hockey in follow-up.  She sees Dr. Raliegh Scarlet for primary care.  She just completed lab work on January 22, 2019.  Lipid studies were excellent with total cholesterol 115, triglycerides 116, HDL 41, and LDL 53.  Renal function was stable with a BUN of 12 and creatinine 0.79.  TSH was borderline increased at 4.68.  CBC was stable.  She remains asymptomatic with reference to chest pain or shortness of breath.  She has not been successful with weight loss.    I saw her in a telemedicine evaluation in June 2021.  Unfortunately she was still smoking cigarettes.  She underwent a follow-up echo Doppler study on July 27, 2019 which showed an EF of 50 to 55% with septal hypokinesis consistent with her prior CABG revascularization.  There was mild asymmetric LVH and grade 2 diastolic dysfunction.  Tissue Doppler was abnormal with elevated left ventricular end-diastolic pressure.  She was without recurrent anginal symptoms.  I saw her in February 2022.  At that time she can continue to smoke cigarettes with a pack lasting approximately 1-1/2 days.  She had no intention to quit.  She had seen Dr. Roxan Hockey for follow-up evaluation.  Recent laboratory had showed a potassium of 5.6 and she was drinking significant amount of orange juice.  She denied palpitations.  Was having swelling and I recommended HCTZ 12.5 mg to take for swelling and then changed to on an as-needed basis.  I recommended follow-up laboratory for her potassium.  Following that evaluation, she drove to [place].  Apparently on her way home she became ill and was hospitalized in [facility] for 2 weeks.  Apparently she required 2 units of packed red blood cells.  During her hospitalization, he apparently  went into atrial fibrillation and was placed on amiodarone and beta-blocker temporarily.  He is were held at times during low blood pressures.  She was found to have iron deficiency edema.  Her medications were adjusted.  Upon return she was evaluated by Dr. Christinia Gully her pulmonologist.  She stopped smoking in February 2022.  She recently saw her primary provider, Lorrene Reid, PAC.  Amiodarone was discontinued on July 24, 2020.  I last saw her on July 27, 2020. At that time, she felt well and denied any chest pain.  She was unaware of any recurrent atrial fibrillation.    I last saw her on November 29, 2020.  Prior to that evaluation she had been seen by Dr. Modesto Charon for an annual follow-up visit.  A recent CT scan showed a new 9 mm nodular subpleural lesion in Megan left lower lobe.  There is concern for possible new primary bronchogenic carcinoma but also in Megan differential is a nodule leftover from her recent pneumonia in [place] or other infectious or inflammatory nodules.  As result,  a short-term interval follow-up with a skin scan has been ordered and is scheduled for December 21, 2020 with follow-up with Dr. Roxan Hockey on December 26, 2020.  [name] admits that she has continued to smoke cigarettes and still smokes 1 pack of cigarettes every 3 days.  She states at home she has supplemental oxygen and uses it to sleep.  She rarely notes her oxygen saturation greater than 88%.  She has underlying COPD and emphysema.  She is unaware of any chest tightness but admits to exertional dyspnea.    On her short-term interval follow-up PET/CT Megan nodule was markedly hypermetabolic and there was no evidence for regional or distant metastases.  She findings were consistent with non-small cell CA.  She was not felt to be a good surgical candidate and had limited pulmonary reserve.  Stereotactic radiation was recommended.  She underwent a Lexiscan Myoview study on December 28, 2020 which was low  risk and demonstrated septal and apical thinning versus small prior infarct.  There was no ischemia.  Gated EF was 46% with global hypokinesis.  Presently, she has undergone 5 radiation treatments.  She denies chest pain or shortness of breath.  Unfortunately she still smokes cigarettes, currently 1 pack lasting 3 days.  She has not had recent laboratory.  She presents for evaluation.   Past Medical History:  Diagnosis Date   CAD (coronary artery disease)    Cancer ([place])    Dyslipidemia    GERD (gastroesophageal reflux disease)    History of radiation therapy    Left lung- 01/16/21-01/30/21- Dr. Gery Pray   HTN (hypertension)    Myocardial infarct [facility])    Non-small cell lung cancer ([place]) 08/19/2010   Stage IA, status post left upper lobectomy July 2012   Tobacco abuse     Past Surgical History:  Procedure Laterality Date   CARDIAC CATHETERIZATION  07/06/2010   see CABG report - pt sent to OR   CARDIOVASCULAR STRESS TEST  09/11/2010   R/S MV - normal perfusion in all regions, EF 46%, no scintigraphic evidence of inducible myocardial ischemia; global LV systolic function mildly reduced; no significant wall motion abnormalities noted; Exercise capacity 7 METS; EKG negative for ischemia; low risk study, no signifcant change from previous study 08/2003   CORONARY ARTERY BYPASS GRAFT  07/11/2010   LIMA to LAD; SVG to 2nd branch OM; SVG to posterior descending artery   LEFT VATS  09/17/2010   TEE WITHOUT CARDIOVERSION  07/06/2010   during emergent CABG surgery; 2-3+ mitral regurgitation    No Known Allergies  Current Outpatient Medications  Medication Sig Dispense Refill   albuterol (PROAIR HFA) 108 (90 Base) MCG/ACT inhaler 2 puffs up to every 4 hours as needed only  if your can't catch your breath     aspirin 81 MG tablet Take 81 mg by mouth daily.     Coenzyme Q10 300 MG CAPS Take by mouth.     dextromethorphan-guaiFENesin (MUCINEX DM) 30-600 MG 12hr tablet Take 1 tablet by mouth  as needed for cough.     fluticasone (FLONASE) 50 MCG/ACT nasal spray Place 2 sprays into both nostrils as needed. 16 g 5   furosemide (LASIX) 20 MG tablet Take 1 tablet (20 mg total) by mouth daily. 90 tablet 1   ibuprofen (ADVIL) 200 MG tablet Take 200 mg by mouth as needed.     levothyroxine (SYNTHROID) 25 MCG tablet TAKE 0.5 TABLETS (12.5 MCG TOTAL) BY MOUTH DAILY. IN ADDITION TO Megan 50  MCG. 45 tablet 1   levothyroxine (SYNTHROID) 50 MCG tablet TAKE 1 TABLET BY MOUTH EVERY DAY BEFORE BREAKFAST 90 tablet 0   metoprolol succinate (TOPROL-XL) 100 MG 24 hr tablet Take 1 tablet (100 mg total) by mouth daily. Take with or immediately following a meal. 90 tablet 1   Omega-3 Fatty Acids (FISH OIL) 1000 MG CAPS Take 2,000 mg by mouth daily.     rosuvastatin (CRESTOR) 40 MG tablet Take 1 tablet (40 mg total) by mouth daily. 90 tablet 3   No current facility-administered medications for this visit.    Also she is married has 3 children 5 grandchildren. She is an ex-smoker. She does not routinely exercise. There is no alcohol use.  ROS General: Negative; No fevers, chills, or night sweats; positive for purposeful weight loss HEENT: Negative; No changes in vision or hearing, sinus congestion, difficulty swallowing Pulmonary: Non-small cell CA Cardiovascular: See HPI;  GI: Negative; No nausea, vomiting, diarrhea, or abdominal pain GU: Negative; No dysuria, hematuria, or difficulty voiding Musculoskeletal: Negative; no myalgias, joint pain, or weakness Hematologic/Oncology: Negative; no easy bruising, bleeding Endocrine: Positive for insulin resistance; no heat/cold intolerance; no diabetes Neuro: Negative; no changes in balance, headaches Skin: Negative; No rashes or skin lesions Psychiatric: Negative; No behavioral problems, depression Sleep: Negative; No snoring, daytime sleepiness, hypersomnolence, bruxism, restless legs, hypnogognic hallucinations, no cataplexy Other comprehensive 14 point  system review is negative.   PE BP 134/76    Pulse 84    Ht '5\' 3"'  (1.6 m)    Wt 212 lb 12.8 oz (96.5 kg)    SpO2 97%    BMI 37.70 kg/m    Repeat blood pressure by me 118/76  Wt Readings from Last 3 Encounters:  03/30/21 212 lb 12.8 oz (96.5 kg)  03/27/21 213 lb (96.6 kg)  03/12/21 212 lb 4 oz (96.3 kg)   General: Alert, oriented, no distress.  Skin: normal turgor, no rashes, warm and dry HEENT: Normocephalic, atraumatic. Pupils equal round and reactive to light; sclera anicteric; extraocular muscles intact;  Nose without nasal septal hypertrophy Mouth/Parynx benign; Mallinpatti scale 3 Neck: No JVD, no carotid bruits; normal carotid upstroke Lungs: Decreased breath sounds, no wheezing Chest wall: without tenderness to palpitation Heart: PMI not displaced, RRR, s1 s2 normal, 1/6 systolic murmur, no diastolic murmur, no rubs, gallops, thrills, or heaves Abdomen: soft, nontender; no hepatosplenomehaly, BS+; abdominal aorta nontender and not dilated by palpation. Back: no CVA tenderness Pulses 2+ Musculoskeletal: full range of motion, normal strength, no joint deformities Extremities: no clubbing cyanosis or edema, Homan's sign negative  Neurologic: grossly nonfocal; Cranial nerves grossly wnl Psychologic: Normal mood and affect    March 30, 2021 ECG (independently read by me): NSR at 82, Inferior Q III, PRWP, QTc 462 msec  November 29, 2020 ECG (independently read by me):  NSR at 76, Q III, QTc 463 msec  July 24, 2020 ECG (independently read by me):  NSR at 72; PRWP; QTc 466 msec  February 2020 ECG (independently read by me): NSR at 74; QS V1-3, no ectopy, normal intervls  December 2020 ECG (independently read by me): Normal sinus rhythm at 74 bpm.  Poor R wave progression with QS complex V1 through V4.  No ectopy.  Normal intervals.  January 2020 ECG (independently read by me): Normal sinus rhythm at 73 bpm.  Anteroseptal QS complex  January 2019 ECG (independently read by  me): normal sinus rhythm at 76 bpm.  Q wave in lead 3.  Nonspecific  T changes.  October 2018 ECG (independently read by me): Normal sinus rhythm at 73 bpm.  Q wave in 3 and very small Q wave in aVF.  Nonspecific T changes anteriorly.  Normal intervals.  October 2017 ECG (independently read by me): Normal sinus rhythm at 65 bpm.  Nonspecific T changes.  Q wave in lead 3.  October 2016 ECG (independently read by me): Normal sinus rhythm at 77 bpm.  Nonspecific T changes anteriorly which are old.  Normal intervals.  September 2015 ECG (independently read by me): Normal sinus rhythm at 70 beats per minute.  Poor progression anteriorly.  Small Q-wave in lead 3.  Nonspecific T changes anteriorly, unchanged  04/16/2013 ECG (independently read by me): Normal sinus rhythm at 65 beats per minute. T wave changes V1 through V6, nonspecific, unchanged. QTc interval 455 ms  Prior ECG of October 15 2012: NSR @ 62 bpm with T-wave abnormality anteriorly V1 through V5 unchanged.  LABS: BMP Latest Ref Rng & Units 10/25/2020 07/24/2020 06/29/2020  Glucose 65 - 99 mg/dL 97 89 92  BUN 8 - 27 mg/dL '11 13 16  ' Creatinine 0.57 - 1.00 mg/dL 0.72 1.06(H) 1.44(H)  BUN/Creat Ratio 12 - '28 15 12 ' -  Sodium 134 - 144 mmol/L 147(H) 145(H) 143  Potassium 3.5 - 5.2 mmol/L 4.8 4.8 3.9  Chloride 96 - 106 mmol/L 100 101 97  CO2 20 - 29 mmol/L 36(H) 29 36(H)  Calcium 8.7 - 10.3 mg/dL 9.8 10.0 10.1   Hepatic Function Latest Ref Rng & Units 10/25/2020 07/24/2020 03/21/2020  Total Protein 6.0 - 8.5 g/dL 6.8 7.6 7.1  Albumin 3.7 - 4.7 g/dL 4.1 4.4 4.2  AST 0 - 40 IU/L 47(H) 24 21  ALT 0 - 32 IU/L 52(H) 24 30  Alk Phosphatase 44 - 121 IU/L 78 83 72  Total Bilirubin 0.0 - 1.2 mg/dL 0.3 <0.2 0.3   CBC Latest Ref Rng & Units 10/25/2020 07/24/2020 06/29/2020  WBC 3.4 - 10.8 x10E3/uL 8.4 8.9 12.4(H)  Hemoglobin 11.1 - 15.9 g/dL 13.4 14.0 12.7  Hematocrit 34.0 - 46.6 % 44.0 45.4 41.3  Platelets 150 - 450 x10E3/uL 250 262 298.0   Lab Results   Component Value Date   MCV 86 10/25/2020   MCV 86 07/24/2020   MCV 89.4 06/29/2020   Lab Results  Component Value Date   TSH 4.650 (H) 10/25/2020   Lab Results  Component Value Date   HGBA1C 6.2 (H) 10/25/2020   Lipid Panel     Component Value Date/Time   CHOL 112 10/25/2020 0935   CHOL 114 07/22/2012 0840   TRIG 114 10/25/2020 0935   TRIG 170 (H) 07/22/2012 0840   HDL 47 10/25/2020 0935   HDL 42 07/22/2012 0840   CHOLHDL 2.4 10/25/2020 0935   CHOLHDL 3.2 06/26/2016 0859   VLDL 25 06/26/2016 0859   LDLCALC 44 10/25/2020 0935   LDLCALC 38 07/22/2012 0840      RADIOLOGY: No results found.  IMPRESSION:  1. Coronary artery disease due to lipid rich plaque   2. Hx of CABG: 2012   3. Hyperlipidemia with target LDL less than 70   4. Essential hypertension   5. Paroxysmal atrial fibrillation (HCC)   6. Tobacco abuse   7. Pulmonary emphysema, unspecified emphysema type ([place])   8. History of lung cancer   9. Moderate obesity     ASSESSMENT AND PLAN: [name] is a 76 year old Caucasian female who is 26 years following her initial anterior wall  myocardial infarction with VF arrest for which she underwent PTCA emergently of her LAD in 1997. She is 11 years following emergency CABG surgery for life-threatening anatomy with 99% left main disease and total occlusion of her RCA at Megan ostium with left to right collaterals. Her ejection fraction did improve and is now 50-55% with small area of subtle LAD scar.  A follow-up myocardial perfusion study on 11/05/2016 continued to show normal perfusion without scar or ischemia .  She was fortuitously found to have cancer, stage Ia, non-small cell carcinoma.  Unfortunately, she continues to smoke cigarettes.  She has been aggressively treated for blood pressure control and lipid management.  She went into atrial fibrillation when she was hospitalized in [place] traveling back from [place].  Her echo Doppler study in September 2022  showed low normal LV function with EF at 50 to 55% with mildly decreased LV global strain with grade 1 diastolic dysfunction.  Unfortunately, she developed a new 9 mm subpleural nodule in Megan left lower lobe which was significantly hypermetabolic and consistent with Megan metachronus T1, N0, stage Ia non-small cell carcinoma.  In follow-up of her CAD/CABG, a Lexiscan Myoview study in November 2022 was low risk without ischemia.  She has undergone 5 radiation treatments so far.  She is without angina and denies significant shortness of breath.  Unfortunately she still smokes cigarettes although reduced now at 1 pack lasting 3 days.  She has COPD and emphysema.  I have recommended follow-up laboratory with a comprehensive metabolic panel, CBC, TSH, hemoglobin A1c and lipid studies.  She sees [name], NP at [facility] for primary care.  I will see her in 6 months for reevaluation or sooner as needed.   Troy Sine, MD, [facility]  04/07/2021 2:25 PM

## 2021-03-30 NOTE — Patient Instructions (Signed)
Medication Instructions:  Your physician recommends that you continue on your current medications as directed. Please refer to the Current Medication list given to you today.  *If you need a refill on your cardiac medications before your next appointment, please call your pharmacy*   Lab Work: Your physician recommends that you return for lab work in: the next week or so for FASTING CMET, CBC, Lipids, TSH  If you have labs (blood work) drawn today and your tests are completely normal, you will receive your results only by: [place] (if you have MyChart) OR A paper copy in the mail If you have any lab test that is abnormal or we need to change your treatment, we will call you to review the results.   Follow-Up: At [facility], you and your health needs are our priority.  As part of our continuing mission to provide you with exceptional heart care, we have created designated Provider Care Teams.  These Care Teams include your primary Cardiologist (physician) and Advanced Practice Providers (APPs -  Physician Assistants and Nurse Practitioners) who all work together to provide you with the care you need, when you need it.  We recommend signing up for the patient portal called "MyChart".  Sign up information is provided on this After Visit Summary.  MyChart is used to connect with patients for Virtual Visits (Telemedicine).  Patients are able to view lab/test results, encounter notes, upcoming appointments, etc.  Non-urgent messages can be sent to your provider as well.   To learn more about what you can do with MyChart, go to [email or web address].    Your next appointment:   6 month(s)  The format for your next appointment:   In Person  Provider:   Shelva Majestic, MD

## 2021-04-07 ENCOUNTER — Other Ambulatory Visit: Payer: Self-pay | Admitting: Cardiovascular Disease

## 2021-04-07 ENCOUNTER — Encounter: Payer: Self-pay | Admitting: Cardiovascular Disease

## 2021-04-07 ENCOUNTER — Encounter: Payer: Self-pay | Admitting: Nurse Practitioner

## 2021-04-07 DIAGNOSIS — R600 Localized edema: Secondary | ICD-10-CM

## 2021-04-09 MED ORDER — FUROSEMIDE 20 MG PO TABS: 20.0000 mg | ORAL_TABLET | Freq: Every day | ORAL | 1 refills | Status: DC

## 2021-04-25 DIAGNOSIS — J9601 Acute respiratory failure with hypoxia: Secondary | ICD-10-CM | POA: Diagnosis not present

## 2021-05-02 DIAGNOSIS — I480 Paroxysmal atrial fibrillation: Secondary | ICD-10-CM | POA: Diagnosis not present

## 2021-05-02 DIAGNOSIS — I2583 Coronary atherosclerosis due to lipid rich plaque: Secondary | ICD-10-CM | POA: Diagnosis not present

## 2021-05-02 DIAGNOSIS — I10 Essential (primary) hypertension: Secondary | ICD-10-CM | POA: Diagnosis not present

## 2021-05-02 DIAGNOSIS — I2510 Atherosclerotic heart disease of native coronary artery without angina pectoris: Secondary | ICD-10-CM | POA: Diagnosis not present

## 2021-05-02 DIAGNOSIS — E785 Hyperlipidemia, unspecified: Secondary | ICD-10-CM | POA: Diagnosis not present

## 2021-05-02 LAB — CBC
Hematocrit: 40.8 % (ref 34.0–46.6)
Hemoglobin: 13.3 g/dL (ref 11.1–15.9)
MCH: 28.7 pg (ref 26.6–33.0)
MCHC: 32.6 g/dL (ref 31.5–35.7)
MCV: 88 fL (ref 79–97)
Platelets: 220 10*3/uL (ref 150–450)
RBC: 4.63 x10E6/uL (ref 3.77–5.28)
RDW: 13.3 % (ref 11.7–15.4)
WBC: 8.9 10*3/uL (ref 3.4–10.8)

## 2021-05-02 LAB — COMPREHENSIVE METABOLIC PANEL
ALT: 41 IU/L — ABNORMAL HIGH (ref 0–32)
AST: 31 IU/L (ref 0–40)
Albumin/Globulin Ratio: 1.6 (ref 1.2–2.2)
Albumin: 4.1 g/dL (ref 3.7–4.7)
Alkaline Phosphatase: 89 IU/L (ref 44–121)
BUN/Creatinine Ratio: 17 (ref 12–28)
BUN: 13 mg/dL (ref 8–27)
Bilirubin Total: 0.2 mg/dL (ref 0.0–1.2)
CO2: 32 mmol/L — ABNORMAL HIGH (ref 20–29)
Calcium: 9.2 mg/dL (ref 8.7–10.3)
Chloride: 101 mmol/L (ref 96–106)
Creatinine, Ser: 0.78 mg/dL (ref 0.57–1.00)
Globulin, Total: 2.5 g/dL (ref 1.5–4.5)
Glucose: 93 mg/dL (ref 70–99)
Potassium: 4.9 mmol/L (ref 3.5–5.2)
Sodium: 143 mmol/L (ref 134–144)
Total Protein: 6.6 g/dL (ref 6.0–8.5)
eGFR: 80 mL/min/{1.73_m2} (ref >59)

## 2021-05-02 LAB — LIPID PANEL
Chol/HDL Ratio: 2.5 ratio (ref 0.0–4.4)
Cholesterol, Total: 117 mg/dL (ref 100–199)
HDL: 46 mg/dL (ref >39)
LDL Chol Calc (NIH): 53 mg/dL (ref 0–99)
Triglycerides: 96 mg/dL (ref 0–149)
VLDL Cholesterol Cal: 18 mg/dL (ref 5–40)

## 2021-05-02 LAB — TSH: TSH: 2.61 u[IU]/mL (ref 0.450–4.500)

## 2021-05-26 DIAGNOSIS — J9601 Acute respiratory failure with hypoxia: Secondary | ICD-10-CM | POA: Diagnosis not present

## 2021-06-01 ENCOUNTER — Telehealth: Payer: Self-pay | Admitting: *Deleted

## 2021-06-01 NOTE — Telephone Encounter (Signed)
CALLED PATIENT TO ASK ABOUT RESCHEDULING FU ON 06/14/21 DUE TO DR. KINARD BEING ON VACATION, PATIENT AGREED TO COME ON 06-07-21 @ 9 AM ?

## 2021-06-06 ENCOUNTER — Ambulatory Visit (HOSPITAL_COMMUNITY)
Admission: RE | Admit: 2021-06-06 | Discharge: 2021-06-06 | Disposition: A | Discharge status: Home / Self Care | Payer: Medicare HMO | Source: Ambulatory Visit | Attending: Radiation Oncology | Admitting: Radiation Oncology

## 2021-06-06 DIAGNOSIS — J439 Emphysema, unspecified: Secondary | ICD-10-CM | POA: Diagnosis not present

## 2021-06-06 DIAGNOSIS — R918 Other nonspecific abnormal finding of lung field: Secondary | ICD-10-CM | POA: Diagnosis not present

## 2021-06-06 DIAGNOSIS — C3490 Malignant neoplasm of unspecified part of unspecified bronchus or lung: Secondary | ICD-10-CM | POA: Diagnosis not present

## 2021-06-06 DIAGNOSIS — C3412 Malignant neoplasm of upper lobe, left bronchus or lung: Secondary | ICD-10-CM | POA: Insufficient documentation

## 2021-06-06 NOTE — Progress Notes (Signed)
?Radiation Oncology         [phone] ?________________________________ ? ?Name: [name] MRN: [id number]  ?Date: 06/07/2021  DOB: May 15, 1945 ? ?Follow-Up Visit Note ? ?CC: [name], NP  Melrose Nakayama, * ? ?  ICD-10-CM   ?1. Malignant neoplasm of upper lobe of left lung (HCC)  C34.12 NM PET Image Restag (PS) Skull Base To Thigh  ?  ? ? ?Diagnosis: PET positive pulmonary nodule presenting in the left lower lobe, clinical stage IA2 (T1b, N0, M0) ?  ?(2mm subpleural nodule in the left lower lobe, suspicious for primary bronchogenic carcinoma)  ? ?Interval Since Last Radiation: 4 months and 7 days  ? ?Intent: Curative ? ?Radiation Treatment Dates: 01/16/2021 through 01/30/2021 ?Site Technique Total Dose (Gy) Dose per Fx (Gy) Completed Fx Beam Energies  ?Lung, Left: Lung_Lt IMRT 55/55 11 5/5 6XFFF  ? ? ?Narrative:  The patient returns today for routine follow-up and to review recent imaging, she was last seen here for follow up on 03/12/21. Since her last visit,  the patient followed up with Dr. Melvyn Novas on 03/27/21. During which time, the patient's breathing was noted to be unchanged, and she reported ongoing productive cough with clear sputum in the mornings. Physical exam performed during this visit revealed distant but audible wheezing on ausculation, and mild hyperresonant on percussion bilaterally. The patient was encouraged to stay on top of her O2 sats and adjust her supplemental flow accordingly to maintain sats above 90%, and to use her albuterol as needed if further respiratory symptoms arise.           ? ?She underwent a chest x-ray earlier this week.  This showed the treated left lower lobe pulmonary nodule to be much improved however the patient was noted to have increasing soft tissue density around the left hilum.  Concerning for possible recurrence.       ? ?Allergies:  is allergic to codeine. ? ?Meds: ?Current Outpatient Medications  ?Medication Sig Dispense Refill  ?  albuterol (PROAIR HFA) 108 (90 Base) MCG/ACT inhaler 2 puffs up to every 4 hours as needed only  if your can't catch your breath    ? aspirin 81 MG tablet Take 81 mg by mouth daily.    ? Coenzyme Q10 300 MG CAPS Take by mouth.    ? dextromethorphan-guaiFENesin (MUCINEX DM) 30-600 MG 12hr tablet Take 1 tablet by mouth as needed for cough.    ? fluticasone (FLONASE) 50 MCG/ACT nasal spray Place 2 sprays into both nostrils as needed. 16 g 5  ? furosemide (LASIX) 20 MG tablet Take 1 tablet (20 mg total) by mouth daily. 90 tablet 1  ? ibuprofen (ADVIL) 200 MG tablet Take 200 mg by mouth as needed.    ? levothyroxine (SYNTHROID) 25 MCG tablet TAKE 0.5 TABLETS (12.5 MCG TOTAL) BY MOUTH DAILY. IN ADDITION TO THE 50 MCG. 45 tablet 1  ? levothyroxine (SYNTHROID) 50 MCG tablet TAKE 1 TABLET BY MOUTH EVERY DAY BEFORE BREAKFAST 90 tablet 3  ? metoprolol succinate (TOPROL-XL) 100 MG 24 hr tablet Take 1 tablet (100 mg total) by mouth daily. Take with or immediately following a meal. 90 tablet 1  ? Omega-3 Fatty Acids (FISH OIL) 1000 MG CAPS Take 2,000 mg by mouth daily.    ? OXYGEN Inhale 2 L into the lungs as needed.    ? rosuvastatin (CRESTOR) 40 MG tablet Take 1 tablet (40 mg total) by mouth daily. 90 tablet 3  ? ?No current  facility-administered medications for this encounter.  ? ? ?Physical Findings: ?The patient is in no acute distress. Patient is alert and oriented. ? height is 5\' 3"  (1.6 m) and weight is 214 lb 8 oz (97.3 kg). Her temporal temperature is 96.8 ?F (36 ?C) (abnormal). Her blood pressure is 135/62 and her pulse is 75. Her respiration is 18 and oxygen saturation is 97%. .  No significant changes. Lungs are clear to auscultation bilaterally. Heart has regular rate and rhythm. No palpable cervical, supraclavicular, or axillary adenopathy. Abdomen soft, non-tender, normal bowel sounds.  ? ? ?Lab Findings: ?Lab Results  ?Component Value Date  ? WBC 8.9 05/02/2021  ? HGB 13.3 05/02/2021  ? HCT 40.8 05/02/2021  ? MCV  88 05/02/2021  ? PLT 220 05/02/2021  ? ? ?Radiographic Findings: ?CT CHEST WO CONTRAST ? ?Result Date: 06/07/2021 ?CLINICAL DATA:  Non-small cell lung cancer. Followup treatment response. EXAM: CT CHEST WITHOUT CONTRAST TECHNIQUE: Multidetector CT imaging of the chest was performed following the standard protocol without IV contrast. RADIATION DOSE REDUCTION: This exam was performed according to the departmental dose-optimization program which includes automated exposure control, adjustment of the mA and/or kV according to patient size and/or use of iterative reconstruction technique. COMPARISON:  PET-CT 12/21/2020 and chest CT 11/03/2020 FINDINGS: Cardiovascular: The heart is normal in size. No pericardial effusion. Stable atherosclerotic calcifications involving the aorta and coronary arteries. Stable surgical changes from coronary artery bypass surgery. Mediastinum/Nodes: Stable borderline enlarged mediastinal lymph nodes. No progressive findings. The esophagus is grossly normal. Lungs/Pleura: Increasing soft tissue density around the left hilum. Is also significant endobronchial debris in the left lower lobe bronchus which is also significantly compressed. Findings worrisome for recurrent tumor/adenopathy. There also new findings of extensive interstitial thickening and subtle nodularity and thickened obstructed left lower lobe bronchi. I suppose this could be related to radiation treatment but it appears quite medial to the patient's treated pulmonary nodule. This could reflect postobstructive pneumonitis or interstitial spread of tumor. A repeat PET-CT may be helpful for further evaluation. The treated left lower lobe pulmonary nodule is much improved. There is now a linear band of soft tissue in this area measuring approximately 5 mm. Some surrounding radiation changes are no. Stable emphysematous changes and areas of pulmonary scarring. No findings for pulmonary nodules to suggest pulmonary metastatic  disease. Stable surgical changes from left upper lobe lobectomy. Upper Abdomen: No significant upper abdominal findings. No hepatic or adrenal gland lesions are identified. Musculoskeletal: No breast masses, supraclavicular or axillary adenopathy. The bony thorax is intact. No worrisome bone lesions. IMPRESSION: 1. Increasing soft tissue density around the left hilum along with endobronchial debris in the left lower lobe bronchus. Findings worrisome for recurrent tumor/adenopathy. A repeat PET-CT may be helpful for further evaluation. 2. New findings of extensive interstitial thickening, subtle nodularity and thickened obstructed left lower lobe bronchi. I suppose this could be related to radiation treatment but it appears quite medial to the patient's treated left lower lobe pulmonary nodule. This could reflect postobstructive pneumonitis or interstitial spread of tumor. 3. The patient's treated left lower lobe pulmonary nodule is much improved. 4. Stable emphysematous changes and pulmonary scarring. No new pulmonary nodules. 5. Stable surgical changes from left upper lobe lobectomy. 6. No findings for upper abdominal metastatic disease. Aortic Atherosclerosis (ICD10-I70.0) and Emphysema (ICD10-J43.9). Electronically Signed   By: Marijo Sanes M.D.   On: 06/07/2021 09:41   ? ?Impression: PET positive pulmonary nodule presenting in the left lower lobe, clinical stage IA2 (  T1b, N0, M0) ?  ?(17mm subpleural nodule in the left lower lobe, suspicious for primary bronchogenic carcinoma)  ? ?She tolerated her SBRT well.  She has dyspnea on exertion but otherwise seems to be managing well with her pulmonary situation. ? ?I reviewed the patient's chest CT scan with her today.  We discussed that she has had excellent response to her treatment concerning her left lower lobe pulmonary nodule however there is increasing density in the hilar area possibly consistent with recurrence.  I discussed proceeding with a PET scan for  further evaluation and she is agreeable to this study. ? ?Plan: She will undergo a PET CT scan in the near future.  Routine follow-up afterward to review results of scan and potentially plan any treatment if necessar

## 2021-06-07 ENCOUNTER — Ambulatory Visit
Admission: RE | Admit: 2021-06-07 | Discharge: 2021-06-07 | Disposition: A | Discharge status: Home / Self Care | Payer: Medicare HMO | Source: Ambulatory Visit | Attending: Radiation Oncology | Admitting: Radiation Oncology

## 2021-06-07 ENCOUNTER — Encounter: Payer: Self-pay | Admitting: Radiation Oncology

## 2021-06-07 ENCOUNTER — Other Ambulatory Visit: Payer: Self-pay

## 2021-06-07 ENCOUNTER — Telehealth: Payer: Self-pay | Admitting: *Deleted

## 2021-06-07 DIAGNOSIS — Z923 Personal history of irradiation: Secondary | ICD-10-CM | POA: Diagnosis not present

## 2021-06-07 DIAGNOSIS — C3412 Malignant neoplasm of upper lobe, left bronchus or lung: Secondary | ICD-10-CM | POA: Diagnosis not present

## 2021-06-07 DIAGNOSIS — R590 Localized enlarged lymph nodes: Secondary | ICD-10-CM | POA: Diagnosis not present

## 2021-06-07 DIAGNOSIS — Z7982 Long term (current) use of aspirin: Secondary | ICD-10-CM | POA: Insufficient documentation

## 2021-06-07 DIAGNOSIS — Z87891 Personal history of nicotine dependence: Secondary | ICD-10-CM | POA: Diagnosis not present

## 2021-06-07 DIAGNOSIS — I700 Atherosclerosis of aorta: Secondary | ICD-10-CM | POA: Insufficient documentation

## 2021-06-07 DIAGNOSIS — Z79899 Other long term (current) drug therapy: Secondary | ICD-10-CM | POA: Insufficient documentation

## 2021-06-07 NOTE — Telephone Encounter (Signed)
CALLED PATIENT TO INFORM OF PET SCAN FOR 06-08-21- ARRIVAL TIME- 2:45 PM @ [facility], PATIENT TO HAVE WATER ONLY- 6 HRS. PRIOR TO TEST ?

## 2021-06-07 NOTE — Progress Notes (Signed)
[name] is here today for follow up post radiation to the lung. ? ?Lung Side: Left,patient completed treatment on 01/30/21 ? ?Does the patient complain of any of the following: ?Pain: No ?Shortness of breath w/wo exertion: Yes, on exertion.  ?Cough: Yes, productive. Patient states she is  coughing more.  ?Hemoptysis: No ?Pain with swallowing: No ?Swallowing/choking concern: No ?Appetite: Good ?Energy Level:Mild fatigue ?Post radiation skin Changes: No ? ? ? ?Additional comments if applicable:  ? ?Vitals:  ? 06/07/21 0901  ?BP: 135/62  ?Pulse: 75  ?Resp: 18  ?Temp: (!) 96.8 ?F (36 ?C)  ?TempSrc: Temporal  ?Weight: 214 lb 8 oz (97.3 kg)  ?Height: 5\' 3"  (1.6 m)  ?  ?

## 2021-06-08 ENCOUNTER — Ambulatory Visit (HOSPITAL_COMMUNITY)
Admission: RE | Admit: 2021-06-08 | Discharge: 2021-06-08 | Disposition: A | Discharge status: Home / Self Care | Payer: Medicare HMO | Source: Ambulatory Visit | Attending: Radiation Oncology | Admitting: Radiation Oncology

## 2021-06-08 DIAGNOSIS — I2510 Atherosclerotic heart disease of native coronary artery without angina pectoris: Secondary | ICD-10-CM | POA: Diagnosis not present

## 2021-06-08 DIAGNOSIS — C3412 Malignant neoplasm of upper lobe, left bronchus or lung: Secondary | ICD-10-CM | POA: Diagnosis not present

## 2021-06-08 DIAGNOSIS — I700 Atherosclerosis of aorta: Secondary | ICD-10-CM | POA: Diagnosis not present

## 2021-06-08 DIAGNOSIS — K5730 Diverticulosis of large intestine without perforation or abscess without bleeding: Secondary | ICD-10-CM | POA: Diagnosis not present

## 2021-06-08 DIAGNOSIS — C3490 Malignant neoplasm of unspecified part of unspecified bronchus or lung: Secondary | ICD-10-CM | POA: Diagnosis not present

## 2021-06-08 LAB — GLUCOSE, CAPILLARY: Glucose-Capillary: 96 mg/dL (ref 70–99)

## 2021-06-08 MED ORDER — FLUDEOXYGLUCOSE F - 18 (FDG) INJECTION
10.7000 | Freq: Once | INTRAVENOUS | Status: AC
  Administered 2021-06-08: 10.6 via INTRAVENOUS

## 2021-06-14 ENCOUNTER — Ambulatory Visit: Payer: Self-pay | Admitting: Radiation Oncology

## 2021-06-20 ENCOUNTER — Telehealth: Payer: Self-pay | Admitting: *Deleted

## 2021-06-20 NOTE — Telephone Encounter (Signed)
Called patient to inform of fu appt. on 06-26-21, patient was unavailable, will call later ?

## 2021-06-25 DIAGNOSIS — J9601 Acute respiratory failure with hypoxia: Secondary | ICD-10-CM | POA: Diagnosis not present

## 2021-06-25 NOTE — Progress Notes (Signed)
?Radiation Oncology         [phone] ?________________________________ ? ?Name: [name] MRN: [id number]  ?Date: 06/26/2021  DOB: 1945-08-15 ? ?Follow-Up Visit Note ? ?CC: [name], NP  Melrose Nakayama, * ? ?  ICD-10-CM   ?1. Non-small cell lung cancer, unspecified laterality ([place])  C34.90   ?  ?2. Malignant neoplasm of upper lobe of left lung (HCC)  C34.12   ?  ? ? ?Diagnosis:  PET positive pulmonary nodule presenting in the left lower lobe, clinical stage IA2 (T1b, N0, M0) ?  ?(28mm subpleural nodule in the left lower lobe, suspicious for primary bronchogenic carcinoma)  ? ?Interval Since Last Radiation: 4 months and 26 days  ? ?Intent: Curative ? ?Radiation Treatment Dates: 01/16/2021 through 01/30/2021 ?Site Technique Total Dose (Gy) Dose per Fx (Gy) Completed Fx Beam Energies  ?Lung, Left: Lung_Lt IMRT 55/55 11 5/5 6XFFF  ? ?Narrative:  The patient returns today for follow-up. To review from our last visit on 06/07/21, we went over her recent CT scan from 06/06/21 which showed an increasing density in the hilar area possibly concerning for disease recurrence. To better evaluate this, I set her up for a PET scan which we will review together today.       ? ?Subsequent PET on 06/08/21 unfortunately showed the increasing soft tissue density in and around the left hilum as markedly hypermetabolic (SUV max of 44.3), and consistent with recurrent disease.  PET otherwise showed so evidence of hypermetabolic mediastinal or right hilar lymphadenopathy, or any suspicious findings in the neck, abdomen, or pelvis.   A hypermetabolic left lateral pulmonary nodule which was recently treated showed no further activity suggesting complete response to her SBRT.                ? ?She does report that overall she feels her breathing has worsened since she completed her SBRT.  She intermittently uses oxygen as needed.  Today she is currently not wearing oxygen.      ? ?Allergies:  is allergic to  codeine. ? ?Meds: ?Current Outpatient Medications  ?Medication Sig Dispense Refill  ? albuterol (PROAIR HFA) 108 (90 Base) MCG/ACT inhaler 2 puffs up to every 4 hours as needed only  if your can't catch your breath    ? aspirin 81 MG tablet Take 81 mg by mouth daily.    ? Coenzyme Q10 300 MG CAPS Take by mouth.    ? dextromethorphan-guaiFENesin (MUCINEX DM) 30-600 MG 12hr tablet Take 1 tablet by mouth as needed for cough.    ? fluticasone (FLONASE) 50 MCG/ACT nasal spray Place 2 sprays into both nostrils as needed. 16 g 5  ? furosemide (LASIX) 20 MG tablet Take 1 tablet (20 mg total) by mouth daily. 90 tablet 1  ? ibuprofen (ADVIL) 200 MG tablet Take 200 mg by mouth as needed.    ? levothyroxine (SYNTHROID) 25 MCG tablet TAKE 0.5 TABLETS (12.5 MCG TOTAL) BY MOUTH DAILY. IN ADDITION TO THE 50 MCG. 45 tablet 1  ? levothyroxine (SYNTHROID) 50 MCG tablet TAKE 1 TABLET BY MOUTH EVERY DAY BEFORE BREAKFAST 90 tablet 3  ? metoprolol succinate (TOPROL-XL) 100 MG 24 hr tablet Take 1 tablet (100 mg total) by mouth daily. Take with or immediately following a meal. 90 tablet 1  ? Omega-3 Fatty Acids (FISH OIL) 1000 MG CAPS Take 2,000 mg by mouth daily.    ? OXYGEN Inhale 2 L into the lungs as needed.    ?  rosuvastatin (CRESTOR) 40 MG tablet Take 1 tablet (40 mg total) by mouth daily. 90 tablet 3  ? ?No current facility-administered medications for this encounter.  ? ? ?Physical Findings: ?The patient is in no acute distress. Patient is alert and oriented. ? height is 5' 3.5" (1.613 m) and weight is 213 lb 3.2 oz (96.7 kg). Her temperature is 97.8 ?F (36.6 ?C). Her blood pressure is 106/57 (abnormal) and her pulse is 73. Her respiration is 20 and oxygen saturation is 93%. .  No significant changes. Lungs are clear to auscultation bilaterally. Heart has regular rate and rhythm. No palpable cervical, supraclavicular, or axillary adenopathy. Abdomen soft, non-tender, normal bowel sounds. ? ? ?Lab Findings: ?Lab Results  ?Component  Value Date  ? WBC 8.9 05/02/2021  ? HGB 13.3 05/02/2021  ? HCT 40.8 05/02/2021  ? MCV 88 05/02/2021  ? PLT 220 05/02/2021  ? ? ?Radiographic Findings: ?CT CHEST WO CONTRAST ? ?Result Date: 06/07/2021 ?CLINICAL DATA:  Non-small cell lung cancer. Followup treatment response. EXAM: CT CHEST WITHOUT CONTRAST TECHNIQUE: Multidetector CT imaging of the chest was performed following the standard protocol without IV contrast. RADIATION DOSE REDUCTION: This exam was performed according to the departmental dose-optimization program which includes automated exposure control, adjustment of the mA and/or kV according to patient size and/or use of iterative reconstruction technique. COMPARISON:  PET-CT 12/21/2020 and chest CT 11/03/2020 FINDINGS: Cardiovascular: The heart is normal in size. No pericardial effusion. Stable atherosclerotic calcifications involving the aorta and coronary arteries. Stable surgical changes from coronary artery bypass surgery. Mediastinum/Nodes: Stable borderline enlarged mediastinal lymph nodes. No progressive findings. The esophagus is grossly normal. Lungs/Pleura: Increasing soft tissue density around the left hilum. Is also significant endobronchial debris in the left lower lobe bronchus which is also significantly compressed. Findings worrisome for recurrent tumor/adenopathy. There also new findings of extensive interstitial thickening and subtle nodularity and thickened obstructed left lower lobe bronchi. I suppose this could be related to radiation treatment but it appears quite medial to the patient's treated pulmonary nodule. This could reflect postobstructive pneumonitis or interstitial spread of tumor. A repeat PET-CT may be helpful for further evaluation. The treated left lower lobe pulmonary nodule is much improved. There is now a linear band of soft tissue in this area measuring approximately 5 mm. Some surrounding radiation changes are no. Stable emphysematous changes and areas of  pulmonary scarring. No findings for pulmonary nodules to suggest pulmonary metastatic disease. Stable surgical changes from left upper lobe lobectomy. Upper Abdomen: No significant upper abdominal findings. No hepatic or adrenal gland lesions are identified. Musculoskeletal: No breast masses, supraclavicular or axillary adenopathy. The bony thorax is intact. No worrisome bone lesions. IMPRESSION: 1. Increasing soft tissue density around the left hilum along with endobronchial debris in the left lower lobe bronchus. Findings worrisome for recurrent tumor/adenopathy. A repeat PET-CT may be helpful for further evaluation. 2. New findings of extensive interstitial thickening, subtle nodularity and thickened obstructed left lower lobe bronchi. I suppose this could be related to radiation treatment but it appears quite medial to the patient's treated left lower lobe pulmonary nodule. This could reflect postobstructive pneumonitis or interstitial spread of tumor. 3. The patient's treated left lower lobe pulmonary nodule is much improved. 4. Stable emphysematous changes and pulmonary scarring. No new pulmonary nodules. 5. Stable surgical changes from left upper lobe lobectomy. 6. No findings for upper abdominal metastatic disease. Aortic Atherosclerosis (ICD10-I70.0) and Emphysema (ICD10-J43.9). Electronically Signed   By: Marijo Sanes M.D.   On:  06/07/2021 09:41  ? ?NM PET Image Restag (PS) Skull Base To Thigh ? ?Result Date: 06/10/2021 ?CLINICAL DATA:  Subsequent treatment strategy for non-small cell lung cancer. EXAM: NUCLEAR MEDICINE PET SKULL BASE TO THIGH TECHNIQUE: 10.6 mCi F-18 FDG was injected intravenously. Full-ring PET imaging was performed from the skull base to thigh after the radiotracer. CT data was obtained and used for attenuation correction and anatomic localization. Fasting blood glucose: 96 mg/dl COMPARISON:  Chest CT 06/06/2021.  PET-CT 12/21/2020 FINDINGS: Mediastinal blood pool activity: SUV max 3.3  Liver activity: SUV max NA NECK: No hypermetabolic lymph nodes in the neck. Incidental CT findings: None. CHEST: The region of increasing soft tissue density around the left hilum is markedly hypermetabolic with SUV

## 2021-06-26 ENCOUNTER — Other Ambulatory Visit: Payer: Self-pay

## 2021-06-26 ENCOUNTER — Telehealth: Payer: Self-pay | Admitting: *Deleted

## 2021-06-26 ENCOUNTER — Encounter: Payer: Self-pay | Admitting: Radiation Oncology

## 2021-06-26 ENCOUNTER — Ambulatory Visit
Admission: RE | Admit: 2021-06-26 | Discharge: 2021-06-26 | Disposition: A | Discharge status: Home / Self Care | Payer: Medicare HMO | Source: Ambulatory Visit | Attending: Radiation Oncology | Admitting: Radiation Oncology

## 2021-06-26 VITALS — BP 106/57 | HR 73 | Temp 97.8°F | Resp 20 | Ht 63.5 in | Wt 213.2 lb

## 2021-06-26 DIAGNOSIS — Z87891 Personal history of nicotine dependence: Secondary | ICD-10-CM | POA: Diagnosis not present

## 2021-06-26 DIAGNOSIS — I700 Atherosclerosis of aorta: Secondary | ICD-10-CM | POA: Diagnosis not present

## 2021-06-26 DIAGNOSIS — Z79899 Other long term (current) drug therapy: Secondary | ICD-10-CM | POA: Insufficient documentation

## 2021-06-26 DIAGNOSIS — C3412 Malignant neoplasm of upper lobe, left bronchus or lung: Secondary | ICD-10-CM | POA: Diagnosis not present

## 2021-06-26 DIAGNOSIS — K5730 Diverticulosis of large intestine without perforation or abscess without bleeding: Secondary | ICD-10-CM | POA: Diagnosis not present

## 2021-06-26 DIAGNOSIS — Z7982 Long term (current) use of aspirin: Secondary | ICD-10-CM | POA: Insufficient documentation

## 2021-06-26 DIAGNOSIS — R590 Localized enlarged lymph nodes: Secondary | ICD-10-CM | POA: Diagnosis not present

## 2021-06-26 DIAGNOSIS — Z923 Personal history of irradiation: Secondary | ICD-10-CM | POA: Diagnosis not present

## 2021-06-26 DIAGNOSIS — J439 Emphysema, unspecified: Secondary | ICD-10-CM | POA: Diagnosis not present

## 2021-06-26 DIAGNOSIS — C3490 Malignant neoplasm of unspecified part of unspecified bronchus or lung: Secondary | ICD-10-CM

## 2021-06-26 NOTE — Telephone Encounter (Signed)
CALLED PATIENT TO REMIND OF 1 PM FU TODAY, SPOKE WITH PATIENT AND SHE IS AWARE OF THIS APPT. ?

## 2021-06-26 NOTE — Progress Notes (Signed)
[name] is here today for follow up post radiation to the lung. ? ?Lung Side: Left, patient completed treatment on 01/30/21 ? ?Does the patient complain of any of the following: ?Pain: No ?Shortness of breath w/wo exertion: Yes, on exertion. ?Cough: Yes ?Hemoptysis: No ?Pain with swallowing: No ?Swallowing/choking concerns: No ?Appetite: Good ?Weight-  ?Wt Readings from Last 3 Encounters:  ?06/26/21 213 lb 3.2 oz (96.7 kg)  ?06/07/21 214 lb 8 oz (97.3 kg)  ?03/30/21 212 lb 12.8 oz (96.5 kg)  ?  ?  ?Energy Level: Mild fatigue ?Post radiation skin Changes:  no ? ? ? ?Additional comments if applicable:  ?Vitals:  ? 06/26/21 1258  ?BP: (!) 106/57  ?Pulse: 73  ?Resp: 20  ?Temp: 97.8 ?F (36.6 ?C)  ?SpO2: 93%  ?Weight: 213 lb 3.2 oz (96.7 kg)  ?Height: 5' 3.5" (1.613 m)  ?  ?

## 2021-07-17 DIAGNOSIS — N390 Urinary tract infection, site not specified: Secondary | ICD-10-CM | POA: Diagnosis not present

## 2021-07-17 DIAGNOSIS — J209 Acute bronchitis, unspecified: Secondary | ICD-10-CM | POA: Diagnosis not present

## 2021-07-17 DIAGNOSIS — Z1331 Encounter for screening for depression: Secondary | ICD-10-CM | POA: Diagnosis not present

## 2021-07-17 DIAGNOSIS — Z1339 Encounter for screening examination for other mental health and behavioral disorders: Secondary | ICD-10-CM | POA: Diagnosis not present

## 2021-07-24 ENCOUNTER — Ambulatory Visit: Payer: Medicare HMO | Admitting: Thoracic Surgery (Cardiothoracic Vascular Surgery)

## 2021-07-24 ENCOUNTER — Other Ambulatory Visit: Payer: Self-pay | Admitting: *Deleted

## 2021-07-24 ENCOUNTER — Encounter: Payer: Self-pay | Admitting: Thoracic Surgery (Cardiothoracic Vascular Surgery)

## 2021-07-24 VITALS — BP 116/69 | HR 82 | Resp 20 | Ht 64.0 in | Wt 213.0 lb

## 2021-07-24 DIAGNOSIS — R918 Other nonspecific abnormal finding of lung field: Secondary | ICD-10-CM

## 2021-07-24 DIAGNOSIS — R911 Solitary pulmonary nodule: Secondary | ICD-10-CM | POA: Diagnosis not present

## 2021-07-24 DIAGNOSIS — Z85118 Personal history of other malignant neoplasm of bronchus and lung: Secondary | ICD-10-CM | POA: Diagnosis not present

## 2021-07-24 DIAGNOSIS — Z902 Acquired absence of lung [part of]: Secondary | ICD-10-CM

## 2021-07-24 NOTE — Progress Notes (Signed)
      [address]       [place] [address]             [phone]      HPI:     Current Outpatient Medications  Medication Sig Dispense Refill  . albuterol (PROAIR HFA) 108 (90 Base) MCG/ACT inhaler 2 puffs up to every 4 hours as needed only  if your can't catch your breath    . aspirin 81 MG tablet Take 81 mg by mouth daily.    . Coenzyme Q10 300 MG CAPS Take by mouth.    . dextromethorphan-guaiFENesin (MUCINEX DM) 30-600 MG 12hr tablet Take 1 tablet by mouth as needed for cough.    . fluticasone (FLONASE) 50 MCG/ACT nasal spray Place 2 sprays into both nostrils as needed. 16 g 5  . furosemide (LASIX) 20 MG tablet Take 1 tablet (20 mg total) by mouth daily. 90 tablet 1  . ibuprofen (ADVIL) 200 MG tablet Take 200 mg by mouth as needed.    Marland Kitchen levothyroxine (SYNTHROID) 25 MCG tablet TAKE 0.5 TABLETS (12.5 MCG TOTAL) BY MOUTH DAILY. IN ADDITION TO THE 50 MCG. 45 tablet 1  . levothyroxine (SYNTHROID) 50 MCG tablet TAKE 1 TABLET BY MOUTH EVERY DAY BEFORE BREAKFAST 90 tablet 3  . metoprolol succinate (TOPROL-XL) 100 MG 24 hr tablet Take 1 tablet (100 mg total) by mouth daily. Take with or immediately following a meal. 90 tablet 1  . Omega-3 Fatty Acids (FISH OIL) 1000 MG CAPS Take 2,000 mg by mouth daily.    . OXYGEN Inhale 2 L into the lungs as needed.    . rosuvastatin (CRESTOR) 40 MG tablet Take 1 tablet (40 mg total) by mouth daily. 90 tablet 3   No current facility-administered medications for this visit.    Physical Exam  Diagnostic Tests:   Impression:  Plan: Bronchoscopy and endobronchial ultrasound on Friday, 07/27/2021  Melrose Nakayama, MD [facility] [phone]

## 2021-07-24 NOTE — H&P (View-Only) (Signed)
[address]       [place] [address]             [phone]      HPI: [name] returns for evaluation of the left hilar mass.  [name] is a 76 year old woman with a history of coronary disease, MI, cardiac arrest, CABG, metachronous stage Ia lung cancer, status post left upper lobectomy, status post stereotactic radiation, hypertension, hyperlipidemia, reflux, tobacco abuse, and COPD.  She had a left upper lobectomy in 2012 for stage Ia non-small cell carcinoma.  Last year she had an enlarging left lower lobe nodule that was hypermetabolic on PET/CT.  She was treated with stereotactic radiation.  On a recent follow-up with Dr. Sondra Come a CT showed a left hilar density.  On PET/CT this soft tissue density mass was hypermetabolic with an SUV of 21.3.  No evidence of disease elsewhere.  This area is separate and distinct from her radiation site.  She is on oxygen.  Breathing has worsened recently.  Quit smoking in February 2022.  No weight loss.  No headaches or visual changes.  Zubrod Score: At the time of surgery this patient's most appropriate activity status/level should be described as: []     0    Normal activity, no symptoms []     1    Restricted in physical strenuous activity but ambulatory, able to do out light work [x]     2    Ambulatory and capable of self care, unable to do work activities, up and about >50 % of waking hours                              []     3    Only limited self care, in bed greater than 50% of waking hours []     4    Completely disabled, no self care, confined to bed or chair []     5    Moribund  Past Medical History:  Diagnosis Date   CAD (coronary artery disease)    Cancer ([place])    Dyslipidemia    GERD (gastroesophageal reflux disease)    History of radiation therapy    Left lung- 01/16/21-01/30/21- Dr. Gery Pray   HTN (hypertension)    Myocardial infarct [facility])    Non-small cell lung cancer ([place]) 08/19/2010    Stage IA, status post left upper lobectomy July 2012   Tobacco abuse    Past Surgical History:  Procedure Laterality Date   CARDIAC CATHETERIZATION  07/06/2010   see CABG report - pt sent to OR   CARDIOVASCULAR STRESS TEST  09/11/2010   R/S MV - normal perfusion in all regions, EF 46%, no scintigraphic evidence of inducible myocardial ischemia; global LV systolic function mildly reduced; no significant wall motion abnormalities noted; Exercise capacity 7 METS; EKG negative for ischemia; low risk study, no signifcant change from previous study 08/2003   CORONARY ARTERY BYPASS GRAFT  07/11/2010   LIMA to LAD; SVG to 2nd branch OM; SVG to posterior descending artery   LEFT VATS  09/17/2010   TEE WITHOUT CARDIOVERSION  07/06/2010   during emergent CABG surgery; 2-3+ mitral regurgitation    Current Outpatient Medications  Medication Sig Dispense Refill   albuterol (PROAIR HFA) 108 (90 Base) MCG/ACT inhaler 2 puffs up to every 4 hours as needed only  if your can't catch your breath     aspirin 81  MG tablet Take 81 mg by mouth daily.     Coenzyme Q10 300 MG CAPS Take by mouth.     dextromethorphan-guaiFENesin (MUCINEX DM) 30-600 MG 12hr tablet Take 1 tablet by mouth as needed for cough.     fluticasone (FLONASE) 50 MCG/ACT nasal spray Place 2 sprays into both nostrils as needed. 16 g 5   furosemide (LASIX) 20 MG tablet Take 1 tablet (20 mg total) by mouth daily. 90 tablet 1   ibuprofen (ADVIL) 200 MG tablet Take 200 mg by mouth as needed.     levothyroxine (SYNTHROID) 25 MCG tablet TAKE 0.5 TABLETS (12.5 MCG TOTAL) BY MOUTH DAILY. IN ADDITION TO THE 50 MCG. 45 tablet 1   levothyroxine (SYNTHROID) 50 MCG tablet TAKE 1 TABLET BY MOUTH EVERY DAY BEFORE BREAKFAST 90 tablet 3   metoprolol succinate (TOPROL-XL) 100 MG 24 hr tablet Take 1 tablet (100 mg total) by mouth daily. Take with or immediately following a meal. 90 tablet 1   Omega-3 Fatty Acids (FISH OIL) 1000 MG CAPS Take 2,000 mg by mouth daily.      OXYGEN Inhale 2 L into the lungs as needed.     rosuvastatin (CRESTOR) 40 MG tablet Take 1 tablet (40 mg total) by mouth daily. 90 tablet 3   No current facility-administered medications for this visit.    Physical Exam BP 116/69   Pulse 82   Resp 20   Ht 5\' 4"  (1.626 m)   Wt 213 lb (96.6 kg)   SpO2 90% Comment: 3L O2 per [place]  BMI 36.56 kg/m  Obese 76 year old woman in no acute distress Wearing nasal cannula oxygen Alert and oriented x3 with no focal deficits No cervical or supraclavicular adenopathy Cardiac regular rate and rhythm Lungs faint wheezes left base otherwise clear  Diagnostic Tests: NUCLEAR MEDICINE PET SKULL BASE TO THIGH   TECHNIQUE: 10.6 mCi F-18 FDG was injected intravenously. Full-ring PET imaging was performed from the skull base to thigh after the radiotracer. CT data was obtained and used for attenuation correction and anatomic localization.   Fasting blood glucose: 96 mg/dl   COMPARISON:  Chest CT 06/06/2021.  PET-CT 12/21/2020   FINDINGS: Mediastinal blood pool activity: SUV max 3.3   Liver activity: SUV max NA   NECK: No hypermetabolic lymph nodes in the neck.   Incidental CT findings: None.   CHEST: The region of increasing soft tissue density around the left hilum is markedly hypermetabolic with SUV max = 40.0, compatible with recurrent disease. This finding is new since prior PET-CT of 12/21/2020.   The area of posterior left lower lobe peripheral airway obstruction and interstitial thickening shows no substantial hypermetabolism, likely a post obstructive process.   No hypermetabolic mediastinal or right hilar lymphadenopathy   Incidental CT findings: Coronary artery calcification is evident. Mild atherosclerotic calcification is noted in the wall of the thoracic aorta.   ABDOMEN/PELVIS: No abnormal hypermetabolic activity within the liver, pancreas, adrenal glands, or spleen. No hypermetabolic lymph nodes in the abdomen or  pelvis.   Incidental CT findings: Tiny nonobstructing stone or vascular calcification noted interpolar right kidney. There is moderate atherosclerotic calcification of the abdominal aorta without aneurysm. Left colonic diverticulosis without diverticulitis.   SKELETON: No focal hypermetabolic activity to suggest skeletal metastasis.   Incidental CT findings: none   IMPRESSION: 1. Increasing soft tissue density in and around the left hilum described on recent diagnostic chest CT is markedly hypermetabolic consistent with recurrent disease. No evidence for hypermetabolic mediastinal or  right hilar lymphadenopathy. 2. No unexpected or suspicious soft tissue hypermetabolism in the neck, abdomen, or pelvis to suggest distant metastases. 3.  Aortic Atherosclerois (ICD10-170.0)     Electronically Signed   By: [name] M.D.   On: 06/10/2021 08:48   I personally reviewed the CT images and concur with the findings noted above.  There is a large left hilar mass that is markedly hypermetabolic.  Impression: [name] is a 76 year old woman with a history of tobacco abuse and metachronous stage Ia lung cancers.  She had a left upper lobectomy in 2012 and then a presumed non-small cell carcinoma of the left lower lobe treated with stereotactic radiation last year.  She did finally quit smoking over a year ago.  She now has a large left hilar mass that is intensely hypermetabolic.  Differential diagnosis primarily small cell carcinoma versus non-small cell carcinoma.  She needs a biopsy to confirm the diagnosis so that appropriate treatment can be initiated.  The best option for biopsy is bronchoscopy and endobronchial ultrasound.  I suspect there is an endobronchial component and will be able to get it with bronchoscopy directly but will be prepared to do an endobronchial ultrasound if necessary.  I described the proposed operation to Mrs. Amico and her husband.  They understand  this is an endoscopic procedure but will be done in the operating room under general anesthesia.  We will plan to do it on an outpatient basis.  I informed them of the indications, risks, benefits, and alternatives.  They understand the risks include those associated with general anesthesia.  They understand the risks include, but not limited to MI, bleeding, pneumothorax, failure to make a diagnosis, DVT, PE, as well as other unforeseeable complications.  She accepts the risk and agrees to proceed.   Plan: Bronchoscopy and endobronchial ultrasound on Friday, 07/27/2021  Melrose Nakayama, MD [facility] [phone]

## 2021-07-25 ENCOUNTER — Encounter (HOSPITAL_COMMUNITY): Payer: Self-pay

## 2021-07-25 ENCOUNTER — Other Ambulatory Visit: Payer: Self-pay

## 2021-07-25 ENCOUNTER — Ambulatory Visit (HOSPITAL_COMMUNITY)
Admission: RE | Admit: 2021-07-25 | Discharge: 2021-07-25 | Disposition: A | Discharge status: Home / Self Care | Payer: Medicare HMO | Source: Ambulatory Visit | Attending: Thoracic Surgery (Cardiothoracic Vascular Surgery) | Admitting: Thoracic Surgery (Cardiothoracic Vascular Surgery)

## 2021-07-25 ENCOUNTER — Encounter (HOSPITAL_COMMUNITY)
Admission: RE | Admit: 2021-07-25 | Discharge: 2021-07-25 | Disposition: A | Discharge status: Home / Self Care | Payer: Medicare HMO | Source: Ambulatory Visit | Attending: Thoracic Surgery (Cardiothoracic Vascular Surgery) | Admitting: Thoracic Surgery (Cardiothoracic Vascular Surgery)

## 2021-07-25 VITALS — BP 97/51 | HR 88 | Temp 99.2°F | Resp 20 | Ht 63.0 in | Wt 212.7 lb

## 2021-07-25 DIAGNOSIS — Z01812 Encounter for preprocedural laboratory examination: Secondary | ICD-10-CM | POA: Insufficient documentation

## 2021-07-25 DIAGNOSIS — Z20822 Contact with and (suspected) exposure to covid-19: Secondary | ICD-10-CM | POA: Diagnosis not present

## 2021-07-25 DIAGNOSIS — Z902 Acquired absence of lung [part of]: Secondary | ICD-10-CM | POA: Insufficient documentation

## 2021-07-25 DIAGNOSIS — R918 Other nonspecific abnormal finding of lung field: Secondary | ICD-10-CM | POA: Insufficient documentation

## 2021-07-25 DIAGNOSIS — C3490 Malignant neoplasm of unspecified part of unspecified bronchus or lung: Secondary | ICD-10-CM | POA: Diagnosis not present

## 2021-07-25 DIAGNOSIS — R42 Dizziness and giddiness: Secondary | ICD-10-CM | POA: Diagnosis not present

## 2021-07-25 DIAGNOSIS — J9611 Chronic respiratory failure with hypoxia: Secondary | ICD-10-CM | POA: Insufficient documentation

## 2021-07-25 DIAGNOSIS — I340 Nonrheumatic mitral (valve) insufficiency: Secondary | ICD-10-CM | POA: Diagnosis not present

## 2021-07-25 DIAGNOSIS — J9612 Chronic respiratory failure with hypercapnia: Secondary | ICD-10-CM | POA: Diagnosis not present

## 2021-07-25 DIAGNOSIS — I2510 Atherosclerotic heart disease of native coronary artery without angina pectoris: Secondary | ICD-10-CM | POA: Insufficient documentation

## 2021-07-25 DIAGNOSIS — Z01818 Encounter for other preprocedural examination: Secondary | ICD-10-CM

## 2021-07-25 DIAGNOSIS — I252 Old myocardial infarction: Secondary | ICD-10-CM | POA: Insufficient documentation

## 2021-07-25 DIAGNOSIS — Z955 Presence of coronary angioplasty implant and graft: Secondary | ICD-10-CM | POA: Insufficient documentation

## 2021-07-25 DIAGNOSIS — J439 Emphysema, unspecified: Secondary | ICD-10-CM | POA: Diagnosis not present

## 2021-07-25 HISTORY — DX: Hypothyroidism, unspecified: E03.9

## 2021-07-25 HISTORY — DX: Chronic obstructive pulmonary disease, unspecified: J44.9

## 2021-07-25 LAB — COMPREHENSIVE METABOLIC PANEL
ALT: 105 U/L — ABNORMAL HIGH (ref 0–44)
AST: 36 U/L (ref 15–41)
Albumin: 2.4 g/dL — ABNORMAL LOW (ref 3.5–5.0)
Alkaline Phosphatase: 67 U/L (ref 38–126)
Anion gap: 6 (ref 5–15)
BUN: 11 mg/dL (ref 8–23)
CO2: 37 mmol/L — ABNORMAL HIGH (ref 22–32)
Calcium: 8.3 mg/dL — ABNORMAL LOW (ref 8.9–10.3)
Chloride: 97 mmol/L — ABNORMAL LOW (ref 98–111)
Creatinine, Ser: 0.92 mg/dL (ref 0.44–1.00)
GFR, Estimated: >60 mL/min (ref >60)
Glucose, Bld: 138 mg/dL — ABNORMAL HIGH (ref 70–99)
Potassium: 3.7 mmol/L (ref 3.5–5.1)
Sodium: 140 mmol/L (ref 135–145)
Total Bilirubin: 0.6 mg/dL (ref 0.3–1.2)
Total Protein: 5.9 g/dL — ABNORMAL LOW (ref 6.5–8.1)

## 2021-07-25 LAB — CBC
HCT: 43.1 % (ref 36.0–46.0)
Hemoglobin: 12.6 g/dL (ref 12.0–15.0)
MCH: 26.9 pg (ref 26.0–34.0)
MCHC: 29.2 g/dL — ABNORMAL LOW (ref 30.0–36.0)
MCV: 91.9 fL (ref 80.0–100.0)
Platelets: 254 10*3/uL (ref 150–400)
RBC: 4.69 MIL/uL (ref 3.87–5.11)
RDW: 15.5 % (ref 11.5–15.5)
WBC: 15.7 10*3/uL — ABNORMAL HIGH (ref 4.0–10.5)
nRBC: 0 % (ref 0.0–0.2)

## 2021-07-25 LAB — PROTIME-INR
INR: 1.1 (ref 0.8–1.2)
Prothrombin Time: 13.9 seconds (ref 11.4–15.2)

## 2021-07-25 LAB — SARS CORONAVIRUS 2 BY RT PCR: SARS Coronavirus 2 by RT PCR: NEGATIVE

## 2021-07-25 LAB — APTT: aPTT: 28 seconds (ref 24–36)

## 2021-07-25 NOTE — Progress Notes (Addendum)
PCP - [name], NP Cardiologist - Shelva Majestic, MD- pt denies any current cardiac issues. Pulmonologist- Christinia Gully, MD Rad Onc- Gery Pray, MD  Chest x-ray - 07/25/2021  Chest CT- 06/07/21 EKG - 03/30/21 Stress Test - 12/28/20 ECHO - 11/14/20 Cardiac Cath - 06/2010- prior to going to OR for CABG  Sleep Study - denies- stop Bang 7- routed to PCP  Aspirin Instructions: OK to continue, do not take DOS per [employer]. Pt notified and verbalized understanding.   ERAS Protcol - ERAS per protocol no drink   COVID TEST- 07/25/2021 - pt denies Covid symptoms or exposure to COVID-   Patient wears O2- currently 3Lnc- O2 Sat 92% at PAT. pt states she wears it off and on, but off O2 states her O2 sat will be down to 88% normally  Anesthesia review: yes- medical history-  Pt states she was seen at [facility] in [place] on 07/17/21 due to increased difficulty breathing. Pt states that they prescribed her prednisone, Augmentin, and albuterol. Pt states she has been feeling lightheaded/hazy since then. No syncope or dizziness when standing. temp checked x2 at PAT- 100.2 and 99.2. Dr Roxan Hockey aware and OK to proceed per Levonne Spiller, RN.   Patient denies shortness of breath, cough and chest pain at PAT appointment   All instructions explained to the patient, with a verbal understanding of the material. Patient agrees to go over the instructions while at home for a better understanding. Patient also instructed to self quarantine after being tested for COVID-19. The opportunity to ask questions was provided.

## 2021-07-25 NOTE — Progress Notes (Signed)
   07/25/21 1509  OBSTRUCTIVE SLEEP APNEA  Have you ever been diagnosed with sleep apnea through a sleep study? No  Do you snore loudly (loud enough to be heard through closed doors)?  1  Do you often feel tired, fatigued, or sleepy during the daytime (such as falling asleep during driving or talking to someone)? 1  Has anyone observed you stop breathing during your sleep? 1  Do you have, or are you being treated for high blood pressure? 1  BMI more than 35 kg/m2? 1  Age > 50 (1-yes) 1  Neck circumference greater than:Female 16 inches or larger, Female 17inches or larger? 1  Female Gender (Yes=1) 0  Obstructive Sleep Apnea Score 7  Score 5 or greater  Results sent to PCP

## 2021-07-25 NOTE — Progress Notes (Signed)
Surgical Instructions    Your procedure is scheduled on Friday, June 9.  Report to [facility] Main Entrance "A" at 5:30 A.M., then check in with the Admitting office.  Call this number if you have problems the morning of surgery:  [phone]   If you have any questions prior to your surgery date call [phone]: Open Monday-Friday 8am-4pm    Remember:  Do not eat after midnight the night before your surgery  You may drink clear liquids until 4:30AM the morning of your surgery.   Clear liquids allowed are: Water, Non-Citrus Juices (without pulp), Carbonated Beverages, Clear Tea, Black Coffee ONLY (NO MILK, CREAM OR POWDERED CREAMER of any kind), and Gatorade    Take these medicines the morning of surgery with A SIP OF WATER:   levothyroxine (SYNTHROID) metoprolol succinate (TOPROL-XL)  rosuvastatin (CRESTOR)  amoxicillin-clavulanate (AUGMENTIN)  Prednisone (deltasone) albuterol (PROAIR HFA) 108 (90 Base) MCG/ACT inhaler if needed- Please bring all inhalers with you the day of surgery.  Albuterol Nebulizer if needed fluticasone (FLONASE) 50 MCG/ACT nasal spray if needed dextromethorphan-guaiFENesin [facility] DM)  if needed  Follow your surgeon's instructions on when to stop Aspirin.  If no instructions were given by your surgeon then you will need to call the office to get those instructions.    As of today, STOP taking any Aleve, Naproxen, Ibuprofen, Motrin, Advil, Goody's, BC's, all herbal medications, fish oil, and all vitamins.     **Please bring your home oxygen with you on the day of your procedure. You will need this for when you are going back home after surgery.   Do not wear jewelry or makeup Do not wear lotions, powders, perfumes/colognes, or deodorant. Do not shave 48 hours prior to surgery.  Men may shave face and neck. Do not bring valuables to the hospital. Do not wear nail polish, gel polish, artificial nails, or any other type of covering on natural nails  (fingers and toes) If you have artificial nails or gel coating that need to be removed by a nail salon, please have this removed prior to surgery. Artificial nails or gel coating may interfere with anesthesia's ability to adequately monitor your vital signs.  [facility] is not responsible for any belongings or valuables. .   Do NOT Smoke (Tobacco/Vaping)  24 hours prior to your procedure  If you use a CPAP at night, you may bring your mask for your overnight stay.   Contacts, glasses, hearing aids, dentures or partials may not be worn into surgery, please bring cases for these belongings   For patients admitted to the hospital, discharge time will be determined by your treatment team.   Patients discharged the day of surgery will not be allowed to drive home, and someone needs to stay with them for 24 hours.   SURGICAL WAITING ROOM VISITATION Patients having surgery or a procedure in a hospital may have two support people. Children under the age of 39 must have an adult with them who is not the patient. They may stay in the waiting area during the procedure and may switch out with other visitors. If the patient needs to stay at the hospital during part of their recovery, the visitor guidelines for inpatient rooms apply.  Please refer to the [facility] website for the visitor guidelines for Inpatients (after your surgery is over and you are in a regular room).       Special instructions:    Oral Hygiene is also important to reduce your risk of  infection.  Remember - BRUSH YOUR TEETH THE MORNING OF SURGERY WITH YOUR REGULAR TOOTHPASTE   [place]- Preparing For Surgery  Before surgery, you can play an important role. Because skin is not sterile, your skin needs to be as free of germs as possible. You can reduce the number of germs on your skin by washing with CHG (chlorahexidine gluconate) Soap before surgery.  CHG is an antiseptic cleaner which kills germs and bonds with the skin  to continue killing germs even after washing.     Please do not use if you have an allergy to CHG or antibacterial soaps. If your skin becomes reddened/irritated stop using the CHG.  Do not shave (including legs and underarms) for at least 48 hours prior to first CHG shower. It is OK to shave your face.  Please follow these instructions carefully.     Shower the NIGHT BEFORE SURGERY and the MORNING OF SURGERY with CHG Soap.   If you chose to wash your hair, wash your hair first as usual with your normal shampoo. After you shampoo, rinse your hair and body thoroughly to remove the shampoo.  Then [employer] and genitals (private parts) with your normal soap and rinse thoroughly to remove soap.  After that Use CHG Soap as you would any other liquid soap. You can apply CHG directly to the skin and wash gently with a scrungie or a clean washcloth.   Apply the CHG Soap to your body ONLY FROM THE NECK DOWN.  Do not use on open wounds or open sores. Avoid contact with your eyes, ears, mouth and genitals (private parts). Wash Face and genitals (private parts)  with your normal soap.   Wash thoroughly, paying special attention to the area where your surgery will be performed.  Thoroughly rinse your body with warm water from the neck down.  DO NOT shower/wash with your normal soap after using and rinsing off the CHG Soap.  Pat yourself dry with a CLEAN TOWEL.  Wear CLEAN PAJAMAS to bed the night before surgery  Place CLEAN SHEETS on your bed the night before your surgery  DO NOT SLEEP WITH PETS.   Day of Surgery:  Take a shower with CHG soap. Wear Clean/Comfortable clothing the morning of surgery Do not apply any deodorants/lotions.   Remember to brush your teeth WITH YOUR REGULAR TOOTHPASTE.    If you received a COVID test during your pre-op visit, it is requested that you wear a mask when out in public, stay away from anyone that may not be feeling well, and notify your surgeon if you  develop symptoms. If you have been in contact with anyone that has tested positive in the last 10 days, please notify your surgeon.    Please read over the following fact sheets that you were given.

## 2021-07-26 DIAGNOSIS — J9601 Acute respiratory failure with hypoxia: Secondary | ICD-10-CM | POA: Diagnosis not present

## 2021-07-26 NOTE — Progress Notes (Signed)
Anesthesia Chart Review:  Follows with cardiology for history of CAD s/p anterior wall myocardial infarction with VF arrest for which she underwent PTCA emergently of her LAD in 1997, CABG 2012.  Most recent nuclear stress November 2022 was low risk without ischemia.  Echo September 2022 showed EF 50 to 55%, normal wall motion, grade 1 DD, mild MR.  Last seen by Dr. Claiborne Billings 03/30/2021.  Stable from cardiac standpoint, no changes made.  Recommended 38-month follow-up.  History of lung cancer s/p left upper lobectomy 2012.  In 2022 she was discovered to have left lower lobe nodule that was hypermetabolic and was treated with stereotactic radiation. On a recent follow-up with Dr. Sondra Come a CT showed a left hilar density.  On PET/CT this soft tissue density mass was hypermetabolic with an SUV of 20.2.  No evidence of disease elsewhere.  This area is noted to be separate and distinct from her radiation site.  Follows with pulmonology for history of COPD Gold 3, tobacco abuse, chronic respiratory failure with hypoxia and hypercapnia.  She has a very low baseline functional status.  She has variable compliance with prescribed use of supplemental oxygen.  Last seen by Dr. Melvyn Novas 03/27/2021.  Per note, "As I explained to this patient in detail:  although there is significant  copd present, it may not be clinically relevant:   it does not appear to be limiting activity tolerance any more than a set of worn tires limits someone from driving a car  around a parking lot.  That is to say:   this pt is so sedentary I don't recommend aggressive pulmonary rx at this point unless limiting symptoms arise or acute exacerbations become as issue, neither of which is the case now.  I asked the patient to contact this office at any time in the future should either of these problems arise."  Patient reports recently being seen in urgent care 07/17/2021 for COPD exacerbation and was treated with prednisone, Augmentin, and albuterol.  She  reports that her respiratory status is back to baseline but she has a persistent feeling of lightheadedness that is difficult to quantify.  Dr. Koleen Nimrod is aware and okay to proceed.  Patient denies being diagnosed with OSA, however, STOP-BANG score is 7 indicating elevated risk.  Preop labs reviewed, WBC mildly elevated at 15.7, otherwise unremarkable.  EKG 03/30/21: NSR. Rate 82.   CT chest 06/06/2021: IMPRESSION: 1. Increasing soft tissue density around the left hilum along with endobronchial debris in the left lower lobe bronchus. Findings worrisome for recurrent tumor/adenopathy. A repeat PET-CT may be helpful for further evaluation. 2. New findings of extensive interstitial thickening, subtle nodularity and thickened obstructed left lower lobe bronchi. I suppose this could be related to radiation treatment but it appears quite medial to the patient's treated left lower lobe pulmonary nodule. This could reflect postobstructive pneumonitis or interstitial spread of tumor. 3. The patient's treated left lower lobe pulmonary nodule is much improved. 4. Stable emphysematous changes and pulmonary scarring. No new pulmonary nodules. 5. Stable surgical changes from left upper lobe lobectomy. 6. No findings for upper abdominal metastatic disease.  Nuclear stress 12/28/20:   Findings are consistent with no prior ischemia. The study is low risk.   No ST deviation was noted.   Left ventricular function is abnormal. Global function is mildly reduced. End diastolic cavity size is mildly enlarged. End systolic cavity size is mildly enlarged.   Prior study available for comparison from 11/05/2016.   Abnormal, low risk  stress nuclear study with septal and apical thinning versus small prior infarct.  No ischemia.  Gated ejection fraction 46% with global hypokinesis and mild left ventricular enlargement.  11/14/20:  1. Left ventricular ejection fraction, by estimation, is 50 to 55%. Left   ventricular ejection fraction by 3D volume is 54 %. The left ventricle has  low normal function. The left ventricle has no regional wall motion  abnormalities. Left ventricular  diastolic parameters are consistent with Grade I diastolic dysfunction  (impaired relaxation). The average left ventricular global longitudinal  strain is -16.7 %. The global longitudinal strain is mildly decreased.   2. Right ventricular systolic function is normal. The right ventricular  size is normal. Tricuspid regurgitation signal is inadequate for assessing  PA pressure.   3. The mitral valve is normal in structure. Mild mitral valve  regurgitation. No evidence of mitral stenosis.   4. The aortic valve is tricuspid. Aortic valve regurgitation is not  visualized. No aortic stenosis is present.   5. The inferior vena cava is normal in size with greater than 50%  respiratory variability, suggesting right atrial pressure of 3 mmHg.   Comparison(s): A prior study was performed on 07/27/2019. Similar LVEF with  slight decrease in LV strain.     Wynonia Musty [facility] Short Stay Center/Anesthesiology Phone [phone] 07/26/2021 9:49 AM

## 2021-07-26 NOTE — Anesthesia Preprocedure Evaluation (Addendum)
Anesthesia Evaluation  Patient identified by MRN, date of birth, ID band Patient awake    Reviewed: Allergy & Precautions, NPO status , Patient's Chart, lab work & pertinent test results  Airway Mallampati: II  TM Distance: >3 FB Neck ROM: Full    Dental no notable dental hx.    Pulmonary neg pulmonary ROS, Current Smoker and Patient abstained from smoking.,    Pulmonary exam normal breath sounds clear to auscultation       Cardiovascular hypertension, + CAD  Normal cardiovascular exam Rhythm:Regular Rate:Normal  Follows with cardiology for history of CAD s/p anterior wall myocardial infarction with VF arrest for which she underwent PTCA emergently of her LAD in 1997, CABG 2012.  Most recent nuclear stress November 2022 was low risk without ischemia.  Echo September 2022 showed EF 50 to 55%, normal wall motion, grade 1 DD, mild MR.  Last seen by Dr. Claiborne Billings 03/30/2021.  Stable from cardiac standpoint, no changes ma   Neuro/Psych negative neurological ROS  negative psych ROS   GI/Hepatic negative GI ROS, Neg liver ROS,   Endo/Other  Hypothyroidism   Renal/GU negative Renal ROS  negative genitourinary   Musculoskeletal negative musculoskeletal ROS (+)   Abdominal   Peds negative pediatric ROS (+)  Hematology negative hematology ROS (+)   Anesthesia Other Findings   Reproductive/Obstetrics negative OB ROS                            Anesthesia Physical Anesthesia Plan  ASA: 3  Anesthesia Plan: General   Post-op Pain Management: Minimal or no pain anticipated   Induction: Intravenous  PONV Risk Score and Plan: 2 and Ondansetron, Dexamethasone and Treatment may vary due to age or medical condition  Airway Management Planned: Oral ETT  Additional Equipment:   Intra-op Plan:   Post-operative Plan: Extubation in OR  Informed Consent: I have reviewed the patients History and Physical,  chart, labs and discussed the procedure including the risks, benefits and alternatives for the proposed anesthesia with the patient or authorized representative who has indicated his/her understanding and acceptance.     Dental advisory given  Plan Discussed with: CRNA and Surgeon  Anesthesia Plan Comments: (PAT note by Karoline Caldwell, PA-C: Follows with cardiology for history of CAD s/p anterior wall myocardial infarction with VF arrest for which she underwent PTCA emergently of her LAD in 1997, CABG 2012.  Most recent nuclear stress November 2022 was low risk without ischemia.  Echo September 2022 showed EF 50 to 55%, normal wall motion, grade 1 DD, mild MR.  Last seen by Dr. Claiborne Billings 03/30/2021.  Stable from cardiac standpoint, no changes made.  Recommended 14-month follow-up.  History of lung cancer s/p left upper lobectomy 2012.  In 2022 she was discovered to have left lower lobe nodule that was hypermetabolic and was treated with stereotactic radiation. On a recent follow-up with Dr.Kinard aCT showed a left hilar density. On PET/CT this soft tissue density mass was hypermetabolic with an SUV of 12.7. No evidence of disease elsewhere. This area is noted to be separate and distinct from her radiation site.  Follows with pulmonology for history of COPD Gold 3, tobacco abuse, chronic respiratory failure with hypoxia and hypercapnia.  She has a very low baseline functional status.  She has variable compliance with prescribed use of supplemental oxygen.  Last seen by Dr. Melvyn Novas 03/27/2021.  Per note, "As I explained to this patient in detail: although there  is significantcopd present, it may not be clinically relevant: it does not appear to be limiting activity tolerance any more than a set of worn tires limits someone from driving a car around a parking lot. That is to say: this pt is so sedentary I don't recommend aggressive pulmonary rx at this point unless limiting symptoms arise or acute  exacerbations become as issue, neither of which is the case now. I asked the patient to contact this office at any time in the future should either of these problems arise."  Patient reports recently being seen in urgent care 07/17/2021 for COPD exacerbation and was treated with prednisone, Augmentin, and albuterol.  She reports that her respiratory status is back to baseline but she has a persistent feeling of lightheadedness that is difficult to quantify.  Dr. Koleen Nimrod is aware and okay to proceed.  Patient denies being diagnosed with OSA, however, STOP-BANG score is 7 indicating elevated risk.  Preop labs reviewed, WBC mildly elevated at 15.7, otherwise unremarkable.  EKG 03/30/21: NSR. Rate 82.   CT chest 06/06/2021: IMPRESSION: 1. Increasing soft tissue density around the left hilum along with endobronchial debris in the left lower lobe bronchus. Findings worrisome for recurrent tumor/adenopathy. A repeat PET-CT may be helpful for further evaluation. 2. New findings of extensive interstitial thickening, subtle nodularity and thickened obstructed left lower lobe bronchi. I suppose this could be related to radiation treatment but it appears quite medial to the patient's treated left lower lobe pulmonary nodule. This could reflect postobstructive pneumonitis or interstitial spread of tumor. 3. The patient's treated left lower lobe pulmonary nodule is much improved. 4. Stable emphysematous changes and pulmonary scarring. No new pulmonary nodules. 5. Stable surgical changes from left upper lobe lobectomy. 6. No findings for upper abdominal metastatic disease.  Nuclear stress 12/28/20: . Findings are consistent with no prior ischemia. The study is low risk. . No ST deviation was noted. . Left ventricular function is abnormal. Global function is mildly reduced. End diastolic cavity size is mildly enlarged. End systolic cavity size is mildly enlarged. . Prior study available for  comparison from 11/05/2016.  Abnormal, low risk stress nuclear study with septal and apical thinning versus small prior infarct. No ischemia. Gated ejection fraction 46% with global hypokinesis and mild left ventricular enlargement.  11/14/20: 1. Left ventricular ejection fraction, by estimation, is 50 to 55%. Left  ventricular ejection fraction by 3D volume is 54 %. The left ventricle has  low normal function. The left ventricle has no regional wall motion  abnormalities. Left ventricular  diastolic parameters are consistent with Grade I diastolic dysfunction  (impaired relaxation). The average left ventricular global longitudinal  strain is -16.7 %. The global longitudinal strain is mildly decreased.  2. Right ventricular systolic function is normal. The right ventricular  size is normal. Tricuspid regurgitation signal is inadequate for assessing  PA pressure.  3. The mitral valve is normal in structure. Mild mitral valve  regurgitation. No evidence of mitral stenosis.  4. The aortic valve is tricuspid. Aortic valve regurgitation is not  visualized. No aortic stenosis is present.  5. The inferior vena cava is normal in size with greater than 50%  respiratory variability, suggesting right atrial pressure of 3 mmHg.   Comparison(s): A prior study was performed on 07/27/2019. Similar LVEF with  slight decrease in LV strain.  )       Anesthesia Quick Evaluation

## 2021-07-27 ENCOUNTER — Encounter (HOSPITAL_COMMUNITY)
Admission: RE | Disposition: A | Discharge status: Home / Self Care | Payer: Self-pay | Source: Ambulatory Visit | Attending: Thoracic Surgery (Cardiothoracic Vascular Surgery)

## 2021-07-27 ENCOUNTER — Other Ambulatory Visit: Payer: Self-pay

## 2021-07-27 ENCOUNTER — Ambulatory Visit (HOSPITAL_BASED_OUTPATIENT_CLINIC_OR_DEPARTMENT_OTHER): Payer: Medicare HMO | Admitting: Physician Assistant

## 2021-07-27 ENCOUNTER — Encounter (HOSPITAL_COMMUNITY): Payer: Self-pay | Admitting: Thoracic Surgery (Cardiothoracic Vascular Surgery)

## 2021-07-27 ENCOUNTER — Telehealth: Payer: Self-pay | Admitting: *Deleted

## 2021-07-27 ENCOUNTER — Ambulatory Visit (HOSPITAL_COMMUNITY): Payer: Medicare HMO | Admitting: Physician Assistant

## 2021-07-27 ENCOUNTER — Ambulatory Visit (HOSPITAL_COMMUNITY)
Admission: RE | Admit: 2021-07-27 | Discharge: 2021-07-27 | Disposition: A | Discharge status: Home / Self Care | Payer: Medicare HMO | Source: Ambulatory Visit | Attending: Thoracic Surgery (Cardiothoracic Vascular Surgery) | Admitting: Thoracic Surgery (Cardiothoracic Vascular Surgery)

## 2021-07-27 DIAGNOSIS — F17200 Nicotine dependence, unspecified, uncomplicated: Secondary | ICD-10-CM | POA: Insufficient documentation

## 2021-07-27 DIAGNOSIS — Z955 Presence of coronary angioplasty implant and graft: Secondary | ICD-10-CM | POA: Diagnosis not present

## 2021-07-27 DIAGNOSIS — I2510 Atherosclerotic heart disease of native coronary artery without angina pectoris: Secondary | ICD-10-CM

## 2021-07-27 DIAGNOSIS — C3432 Malignant neoplasm of lower lobe, left bronchus or lung: Secondary | ICD-10-CM | POA: Insufficient documentation

## 2021-07-27 DIAGNOSIS — R918 Other nonspecific abnormal finding of lung field: Secondary | ICD-10-CM

## 2021-07-27 DIAGNOSIS — Z79899 Other long term (current) drug therapy: Secondary | ICD-10-CM | POA: Insufficient documentation

## 2021-07-27 DIAGNOSIS — E039 Hypothyroidism, unspecified: Secondary | ICD-10-CM | POA: Insufficient documentation

## 2021-07-27 DIAGNOSIS — R69 Illness, unspecified: Secondary | ICD-10-CM | POA: Diagnosis not present

## 2021-07-27 DIAGNOSIS — R911 Solitary pulmonary nodule: Secondary | ICD-10-CM | POA: Diagnosis not present

## 2021-07-27 DIAGNOSIS — I10 Essential (primary) hypertension: Secondary | ICD-10-CM

## 2021-07-27 DIAGNOSIS — I252 Old myocardial infarction: Secondary | ICD-10-CM | POA: Insufficient documentation

## 2021-07-27 DIAGNOSIS — Z951 Presence of aortocoronary bypass graft: Secondary | ICD-10-CM | POA: Insufficient documentation

## 2021-07-27 DIAGNOSIS — Z79890 Hormone replacement therapy (postmenopausal): Secondary | ICD-10-CM | POA: Diagnosis not present

## 2021-07-27 HISTORY — PX: VIDEO BRONCHOSCOPY WITH ENDOBRONCHIAL ULTRASOUND: SHX6177

## 2021-07-27 HISTORY — PX: VIDEO BRONCHOSCOPY: SHX5072

## 2021-07-27 SURGERY — BRONCHOSCOPY, VIDEO-ASSISTED
Anesthesia: General

## 2021-07-27 MED ORDER — 0.9 % SODIUM CHLORIDE (POUR BTL) OPTIME
TOPICAL | Status: DC | PRN
  Administered 2021-07-27: 1000 mL

## 2021-07-27 MED ORDER — ONDANSETRON HCL 4 MG/2ML IJ SOLN: 4.0000 mg | Freq: Once | INTRAMUSCULAR | Status: DC | PRN

## 2021-07-27 MED ORDER — DEXAMETHASONE SODIUM PHOSPHATE 10 MG/ML IJ SOLN
INTRAMUSCULAR | Status: DC | PRN
  Administered 2021-07-27: 10 mg via INTRAVENOUS

## 2021-07-27 MED ORDER — FENTANYL CITRATE (PF) 100 MCG/2ML IJ SOLN: 25.0000 ug | INTRAMUSCULAR | Status: DC | PRN

## 2021-07-27 MED ORDER — EPINEPHRINE PF 1 MG/ML IJ SOLN
INTRAMUSCULAR | Status: DC | PRN
  Administered 2021-07-27: 1 mg via ENDOTRACHEOPULMONARY

## 2021-07-27 MED ORDER — SUGAMMADEX SODIUM 200 MG/2ML IV SOLN
INTRAVENOUS | Status: DC | PRN
  Administered 2021-07-27: 200 mg via INTRAVENOUS

## 2021-07-27 MED ORDER — SUCCINYLCHOLINE CHLORIDE 200 MG/10ML IV SOSY
PREFILLED_SYRINGE | INTRAVENOUS | Status: DC | PRN
  Administered 2021-07-27: 140 mg via INTRAVENOUS

## 2021-07-27 MED ORDER — IPRATROPIUM-ALBUTEROL 0.5-2.5 (3) MG/3ML IN SOLN
RESPIRATORY_TRACT | Status: AC
  Filled 2021-07-27: qty 3

## 2021-07-27 MED ORDER — FENTANYL CITRATE (PF) 250 MCG/5ML IJ SOLN
INTRAMUSCULAR | Status: AC
  Filled 2021-07-27: qty 5

## 2021-07-27 MED ORDER — ONDANSETRON HCL 4 MG/2ML IJ SOLN
INTRAMUSCULAR | Status: DC | PRN
  Administered 2021-07-27: 4 mg via INTRAVENOUS

## 2021-07-27 MED ORDER — MIDAZOLAM HCL 2 MG/2ML IJ SOLN
INTRAMUSCULAR | Status: AC
  Filled 2021-07-27: qty 2

## 2021-07-27 MED ORDER — ORAL CARE MOUTH RINSE: 15.0000 mL | Freq: Once | OROMUCOSAL | Status: AC

## 2021-07-27 MED ORDER — ROCURONIUM BROMIDE 10 MG/ML (PF) SYRINGE
PREFILLED_SYRINGE | INTRAVENOUS | Status: DC | PRN
  Administered 2021-07-27: 20 mg via INTRAVENOUS
  Administered 2021-07-27: 40 mg via INTRAVENOUS

## 2021-07-27 MED ORDER — EPINEPHRINE PF 1 MG/ML IJ SOLN
INTRAMUSCULAR | Status: AC
  Filled 2021-07-27: qty 1

## 2021-07-27 MED ORDER — ACETAMINOPHEN 10 MG/ML IV SOLN: 1000.0000 mg | Freq: Once | INTRAVENOUS | Status: DC | PRN

## 2021-07-27 MED ORDER — IPRATROPIUM-ALBUTEROL 0.5-2.5 (3) MG/3ML IN SOLN
3.0000 mL | Freq: Once | RESPIRATORY_TRACT | Status: AC
  Administered 2021-07-27: 3 mL via RESPIRATORY_TRACT

## 2021-07-27 MED ORDER — CHLORHEXIDINE GLUCONATE 0.12 % MT SOLN: 15.0000 mL | Freq: Once | OROMUCOSAL | Status: AC

## 2021-07-27 MED ORDER — LIDOCAINE 2% (20 MG/ML) 5 ML SYRINGE
INTRAMUSCULAR | Status: DC | PRN
  Administered 2021-07-27: 100 mg via INTRAVENOUS

## 2021-07-27 MED ORDER — PHENYLEPHRINE HCL-NACL 20-0.9 MG/250ML-% IV SOLN
INTRAVENOUS | Status: DC | PRN
  Administered 2021-07-27: 50 ug/min via INTRAVENOUS

## 2021-07-27 MED ORDER — LACTATED RINGERS IV SOLN: INTRAVENOUS | Status: DC

## 2021-07-27 MED ORDER — PROPOFOL 10 MG/ML IV BOLUS
INTRAVENOUS | Status: DC | PRN
  Administered 2021-07-27: 120 mg via INTRAVENOUS

## 2021-07-27 MED ORDER — PROPOFOL 10 MG/ML IV BOLUS
INTRAVENOUS | Status: AC
  Filled 2021-07-27: qty 20

## 2021-07-27 MED ORDER — FENTANYL CITRATE (PF) 250 MCG/5ML IJ SOLN
INTRAMUSCULAR | Status: DC | PRN
  Administered 2021-07-27: 50 ug via INTRAVENOUS

## 2021-07-27 MED ORDER — PHENYLEPHRINE 80 MCG/ML (10ML) SYRINGE FOR IV PUSH (FOR BLOOD PRESSURE SUPPORT)
PREFILLED_SYRINGE | INTRAVENOUS | Status: DC | PRN
  Administered 2021-07-27 (×2): 240 ug via INTRAVENOUS
  Administered 2021-07-27: 160 ug via INTRAVENOUS

## 2021-07-27 MED ORDER — CHLORHEXIDINE GLUCONATE 0.12 % MT SOLN
OROMUCOSAL | Status: AC
  Administered 2021-07-27: 15 mL via OROMUCOSAL
  Filled 2021-07-27: qty 15

## 2021-07-27 SURGICAL SUPPLY — 49 items
ADAPTER VALVE BIOPSY EBUS (MISCELLANEOUS) IMPLANT
ADPTR VALVE BIOPSY EBUS (MISCELLANEOUS)
APL SWBSTK 6 STRL LF DISP (MISCELLANEOUS) ×1
APPLICATOR COTTON TIP 6 STRL (MISCELLANEOUS) ×1 IMPLANT
APPLICATOR COTTON TIP 6IN STRL (MISCELLANEOUS) ×2
BLADE CLIPPER SURG (BLADE) ×2 IMPLANT
BRUSH CYTOL CELLEBRITY 1.5X140 (MISCELLANEOUS) ×1 IMPLANT
CANISTER SUCT 3000ML PPV (MISCELLANEOUS) ×4 IMPLANT
CNTNR URN SCR LID CUP LEK RST (MISCELLANEOUS) ×3 IMPLANT
CONT SPEC 4OZ STRL OR WHT (MISCELLANEOUS) ×6
COVER BACK TABLE 60X90IN (DRAPES) ×4 IMPLANT
FILTER STRAW FLUID ASPIR (MISCELLANEOUS) IMPLANT
FORCEPS BIOP RJ4 1.8 (CUTTING FORCEPS) IMPLANT
FORCEPS RADIAL JAW LRG 4 PULM (INSTRUMENTS) IMPLANT
GAUZE 4X4 16PLY ~~LOC~~+RFID DBL (SPONGE) ×4 IMPLANT
GAUZE SPONGE 4X4 12PLY STRL (GAUZE/BANDAGES/DRESSINGS) ×2 IMPLANT
GLOVE SURG SIGNA 7.5 PF LTX (GLOVE) ×4 IMPLANT
GOWN STRL REUS W/ TWL LRG LVL3 (GOWN DISPOSABLE) ×1 IMPLANT
GOWN STRL REUS W/ TWL XL LVL3 (GOWN DISPOSABLE) ×2 IMPLANT
GOWN STRL REUS W/TWL LRG LVL3 (GOWN DISPOSABLE) ×2
GOWN STRL REUS W/TWL XL LVL3 (GOWN DISPOSABLE) ×4
KIT CLEAN ENDO COMPLIANCE (KITS) ×6 IMPLANT
KIT TURNOVER KIT B (KITS) ×4 IMPLANT
MARKER SKIN DUAL TIP RULER LAB (MISCELLANEOUS) ×4 IMPLANT
NDL ASPIRATION VIZISHOT 19G (NEEDLE) IMPLANT
NDL ASPIRATION VIZISHOT 21G (NEEDLE) ×1 IMPLANT
NDL BLUNT 18X1 FOR OR ONLY (NEEDLE) IMPLANT
NEEDLE ASPIRATION VIZISHOT 19G (NEEDLE) IMPLANT
NEEDLE ASPIRATION VIZISHOT 21G (NEEDLE) ×2 IMPLANT
NEEDLE BLUNT 18X1 FOR OR ONLY (NEEDLE) IMPLANT
NS IRRIG 1000ML POUR BTL (IV SOLUTION) ×4 IMPLANT
OIL SILICONE PENTAX (PARTS (SERVICE/REPAIRS)) ×4 IMPLANT
PAD ARMBOARD 7.5X6 YLW CONV (MISCELLANEOUS) ×8 IMPLANT
RADIAL JAW LRG 4 PULMONARY (INSTRUMENTS) ×1
SPONGE T-LAP 18X18 ~~LOC~~+RFID (SPONGE) ×16 IMPLANT
SPONGE T-LAP 4X18 ~~LOC~~+RFID (SPONGE) ×4 IMPLANT
SYR 20ML ECCENTRIC (SYRINGE) ×6 IMPLANT
SYR 20ML LL LF (SYRINGE) ×2 IMPLANT
SYR 3ML LL SCALE MARK (SYRINGE) IMPLANT
SYR 5ML LL (SYRINGE) ×4 IMPLANT
SYR 5ML LUER SLIP (SYRINGE) ×4 IMPLANT
TOWEL GREEN STERILE (TOWEL DISPOSABLE) ×4 IMPLANT
TOWEL GREEN STERILE FF (TOWEL DISPOSABLE) ×4 IMPLANT
TRAP SPECIMEN MUCUS 40CC (MISCELLANEOUS) ×3 IMPLANT
TUBE CONNECTING 20X1/4 (TUBING) ×4 IMPLANT
VALVE BIOPSY  SINGLE USE (MISCELLANEOUS) ×12
VALVE BIOPSY SINGLE USE (MISCELLANEOUS) ×2 IMPLANT
VALVE SUCTION BRONCHIO DISP (MISCELLANEOUS) ×4 IMPLANT
WATER STERILE IRR 1000ML POUR (IV SOLUTION) ×2 IMPLANT

## 2021-07-27 NOTE — Brief Op Note (Signed)
07/27/2021  9:25 AM  PATIENT:  Lonni Fix  76 y.o. female  PRE-OPERATIVE DIAGNOSIS:  Left Hilar Mass  POST-OPERATIVE DIAGNOSIS:  Left Hilar Mass, probable non-small cell carcinoma  PROCEDURE:  Procedure(s): VIDEO BRONCHOSCOPY (N/A) VIDEO BRONCHOSCOPY WITH ENDOBRONCHIAL ULTRASOUND (N/A) Needle aspirations, brushings and endobronchial biopsies  SURGEON:  Surgeon(s) and Role:    * Melrose Nakayama, MD - Primary  PHYSICIAN ASSISTANT:   ASSISTANTS: none   ANESTHESIA:   general  EBL:  10 mL   BLOOD ADMINISTERED:none  DRAINS: none   LOCAL MEDICATIONS USED:  NONE  SPECIMEN:  Source of Specimen:  left lower lobe bronchus, 10L and 7 nodes  DISPOSITION OF SPECIMEN:  PATHOLOGY  COUNTS:  NO endoscopy  TOURNIQUET:  * No tourniquets in log *  DICTATION: .Other Dictation: Dictation Number -  PLAN OF CARE: Discharge to home after PACU  PATIENT DISPOSITION:  PACU - hemodynamically stable.   Delay start of Pharmacological VTE agent (>24hrs) due to surgical blood loss or risk of bleeding: not applicable

## 2021-07-27 NOTE — Anesthesia Procedure Notes (Signed)
Procedure Name: Intubation Date/Time: 07/27/2021 7:39 AM  Performed by: Vonna Drafts, CRNAPre-anesthesia Checklist: Patient identified, Emergency Drugs available, Suction available and Patient being monitored Patient Re-evaluated:Patient Re-evaluated prior to induction Oxygen Delivery Method: Circle system utilized Preoxygenation: Pre-oxygenation with 100% oxygen Induction Type: IV induction Ventilation: Mask ventilation without difficulty and Oral airway inserted - appropriate to patient size Laryngoscope Size: Mac and 4 Grade View: Grade I Tube type: Oral Tube size: 8.5 mm Number of attempts: 1 Airway Equipment and Method: Stylet and Oral airway Placement Confirmation: ETT inserted through vocal cords under direct vision, positive ETCO2 and breath sounds checked- equal and bilateral Secured at: 23 cm Tube secured with: Tape Dental Injury: Teeth and Oropharynx as per pre-operative assessment

## 2021-07-27 NOTE — Transfer of Care (Signed)
Immediate Anesthesia Transfer of Care Note  Patient: [name]  Procedure(s) Performed: VIDEO BRONCHOSCOPY VIDEO BRONCHOSCOPY WITH ENDOBRONCHIAL ULTRASOUND  Patient Location: PACU  Anesthesia Type:General  Level of Consciousness: drowsy  Airway & Oxygen Therapy: Patient Spontanous Breathing and Patient connected to face mask oxygen  Post-op Assessment: Report given to RN and Post -op Vital signs reviewed and stable  Post vital signs: Reviewed and stable  Last Vitals:  Vitals Value Taken Time  BP 129/104 07/27/21 0923  Temp    Pulse 84 07/27/21 0924  Resp 30 07/27/21 0924  SpO2 99 % 07/27/21 0924  Vitals shown include unvalidated device data.  Last Pain:  Vitals:   07/27/21 0545  TempSrc:   PainSc: 0-No pain         Complications: No notable events documented.

## 2021-07-27 NOTE — Op Note (Signed)
NAMEHOLLE, SPRICK MEDICAL RECORD NO: [id number] ACCOUNT NO: [id number] DATE OF BIRTH: Dec 13, 1945 FACILITY: [facility] LOCATION: [facility] PHYSICIAN: Revonda Standard. Roxan Hockey, MD  Operative Report   DATE OF PROCEDURE: 07/27/2021  PREOPERATIVE DIAGNOSIS:  Left hilar mass.  POSTOPERATIVE DIAGNOSIS:  Probable non-small cell carcinoma.  PROCEDURES:  Bronchoscopy with brushings and endobronchial biopsies and  Endobronchial ultrasound with lymph node needle aspirations.  SURGEON:  Revonda Standard. Roxan Hockey, MD  ASSISTANT:  None.  ANESTHESIA:  General.  FINDINGS:  Left lower lobe bronchus nearly completely occluded, extrinsic compression.  No definite endobronchial tumor seen. Quick prep on level 10 and 7 nodes showed lymphoid cells, but no tumor.  Brushings were positive for non-small cell carcinoma.  CLINICAL NOTE:  [name] is a 76 year old woman with a history of lung cancer treated twice previously and now presents with a left hilar mass that was markedly hypermetabolic on PET-CT. She was advised to undergo bronchoscopy and then the  endobronchial ultrasound for diagnostic purposes.  In the interim on her preoperative chest x-ray, there was significant volume loss.  OPERATIVE NOTE:  Mrs. Markov was brought to the operating room on 07/27/2021.  She had induction of general anesthesia and was intubated.  A timeout was performed.  Flexible fiberoptic bronchoscopy was performed via the endotracheal tube.  The trachea  was normal.  The right bronchial tree was normal to the level of subsegmental bronchi. On the left side, there was near complete occlusion of the bronchus distally.  Initially, minimal manipulation was done in this area.  There was no definite  endobronchial tumor seen, although there was some mucosal abnormality.  It was unclear if this might be tumor or edema.  The bronchoscope was withdrawn.  The endobronchial ultrasound probe was advanced.  I was unable to get to the hilar  nodes but a  10L node was identified.  It appeared relatively normal in size.  Needle aspirations were performed with realtime ultrasound visualization.  A total of 4 aspirations were performed.  They were done both with and without suction applied. Lymphoid cells  were present, but no tumor was seen.  There was a level 7 node that appeared slightly larger.  It was aspirated as well, but this was also nondiagnostic.  The endobronchial ultrasound probe was removed.  The bronchoscope was reinserted. With a little  more aggressive pushing, we were able to get into the more distal airway and there were dense heavy secretions that were suctioned and irrigated with saline. Brushings then were performed and those showed findings consistent with non-small cell  carcinoma.  Multiple biopsies were obtained.  There was bleeding with the biopsies and dilute epinephrine was applied.  There was no ongoing bleeding at the completion of the procedure.  The patient was extubated in the operating room and taken to the  postanesthetic care unit in good condition.   MUK D: 07/27/2021 12:17:27 pm T: 07/27/2021 10:39:00 pm  JOB: [id number]/ [id number]

## 2021-07-27 NOTE — Discharge Instructions (Addendum)
Do not drive or engage in heavy physical activity for 24 hours  You may cough up small amounts of blood over the next few days  You may use an over the counter cough medication if needed  You may use acetaminophen (Tylenol) as needed for discomfort. Follow dosing instructions on bottle.  Call [phone] if you develop chest pain, shortness of breath, fever > 101 F or cough up more than a tablespoon of blood.  Contact Dr. Clabe Seal office for follow up information

## 2021-07-27 NOTE — Anesthesia Postprocedure Evaluation (Signed)
Anesthesia Post Note  Patient: [name]  Procedure(s) Performed: VIDEO BRONCHOSCOPY VIDEO BRONCHOSCOPY WITH ENDOBRONCHIAL ULTRASOUND     Patient location during evaluation: PACU Anesthesia Type: General Level of consciousness: awake and alert Pain management: pain level controlled Vital Signs Assessment: post-procedure vital signs reviewed and stable Respiratory status: spontaneous breathing, nonlabored ventilation, respiratory function stable and patient connected to nasal cannula oxygen Cardiovascular status: blood pressure returned to baseline and stable Postop Assessment: no apparent nausea or vomiting Anesthetic complications: no   No notable events documented.  Last Vitals:  Vitals:   07/27/21 0542 07/27/21 0920  BP: (!) 102/53 128/61  Pulse: 77 80  Resp: 18 13  Temp: 37.1 C   SpO2: 98% (!) 78%    Last Pain:  Vitals:   07/27/21 0545  TempSrc:   PainSc: 0-No pain                 Tashanti Dalporto S

## 2021-07-27 NOTE — Interval H&P Note (Signed)
History and Physical Interval Note:  07/27/2021 7:18 AM  [name]  has presented today for surgery, with the diagnosis of Left Hilar Mass.  The various methods of treatment have been discussed with the patient and family. After consideration of risks, benefits and other options for treatment, the patient has consented to  Procedure(s): VIDEO BRONCHOSCOPY (N/A) VIDEO BRONCHOSCOPY WITH ENDOBRONCHIAL ULTRASOUND (N/A) as a surgical intervention.  The patient's history has been reviewed, patient examined, no change in status, stable for surgery.  I have reviewed the patient's chart and labs.  Questions were answered to the patient's satisfaction.     Melrose Nakayama

## 2021-07-27 NOTE — Telephone Encounter (Signed)
RETURNED PATIENT'S PHONE CALL, SPOKE WITH PATIENT'S HUSBAND

## 2021-07-27 NOTE — Progress Notes (Signed)
Patient on 2L O2 nasal cannula at home. She is needing 4L in PACU. Okay to discharge home on 4L and monitor O2 today per Dr. Kalman Shan.   Gabriel Rainwater, RN

## 2021-07-28 ENCOUNTER — Encounter (HOSPITAL_COMMUNITY): Payer: Self-pay | Admitting: Thoracic Surgery (Cardiothoracic Vascular Surgery)

## 2021-07-28 ENCOUNTER — Inpatient Hospital Stay
Admission: AD | Admit: 2021-07-28 | Payer: Medicare HMO | Source: Other Acute Inpatient Hospital | Admitting: Internal Medicine

## 2021-07-28 DIAGNOSIS — Z951 Presence of aortocoronary bypass graft: Secondary | ICD-10-CM | POA: Diagnosis not present

## 2021-07-28 DIAGNOSIS — R053 Chronic cough: Secondary | ICD-10-CM | POA: Diagnosis not present

## 2021-07-28 DIAGNOSIS — R590 Localized enlarged lymph nodes: Secondary | ICD-10-CM | POA: Diagnosis not present

## 2021-07-28 DIAGNOSIS — J9621 Acute and chronic respiratory failure with hypoxia: Secondary | ICD-10-CM | POA: Diagnosis not present

## 2021-07-28 DIAGNOSIS — R9431 Abnormal electrocardiogram [ECG] [EKG]: Secondary | ICD-10-CM | POA: Diagnosis not present

## 2021-07-28 DIAGNOSIS — R918 Other nonspecific abnormal finding of lung field: Secondary | ICD-10-CM | POA: Diagnosis not present

## 2021-07-28 DIAGNOSIS — J910 Malignant pleural effusion: Secondary | ICD-10-CM | POA: Diagnosis not present

## 2021-07-28 DIAGNOSIS — I2510 Atherosclerotic heart disease of native coronary artery without angina pectoris: Secondary | ICD-10-CM | POA: Diagnosis not present

## 2021-07-28 DIAGNOSIS — J90 Pleural effusion, not elsewhere classified: Secondary | ICD-10-CM | POA: Diagnosis not present

## 2021-07-28 DIAGNOSIS — R600 Localized edema: Secondary | ICD-10-CM | POA: Diagnosis not present

## 2021-07-28 DIAGNOSIS — J939 Pneumothorax, unspecified: Secondary | ICD-10-CM | POA: Diagnosis not present

## 2021-07-28 DIAGNOSIS — I517 Cardiomegaly: Secondary | ICD-10-CM | POA: Diagnosis not present

## 2021-07-28 DIAGNOSIS — Z4682 Encounter for fitting and adjustment of non-vascular catheter: Secondary | ICD-10-CM | POA: Diagnosis not present

## 2021-07-29 DIAGNOSIS — Z85118 Personal history of other malignant neoplasm of bronchus and lung: Secondary | ICD-10-CM | POA: Diagnosis not present

## 2021-07-29 DIAGNOSIS — R0602 Shortness of breath: Secondary | ICD-10-CM | POA: Diagnosis not present

## 2021-07-29 DIAGNOSIS — R9431 Abnormal electrocardiogram [ECG] [EKG]: Secondary | ICD-10-CM | POA: Diagnosis not present

## 2021-07-29 DIAGNOSIS — Z9889 Other specified postprocedural states: Secondary | ICD-10-CM | POA: Diagnosis not present

## 2021-07-29 DIAGNOSIS — J90 Pleural effusion, not elsewhere classified: Secondary | ICD-10-CM | POA: Diagnosis not present

## 2021-07-30 DIAGNOSIS — R0989 Other specified symptoms and signs involving the circulatory and respiratory systems: Secondary | ICD-10-CM | POA: Diagnosis not present

## 2021-07-30 DIAGNOSIS — J9383 Other pneumothorax: Secondary | ICD-10-CM | POA: Diagnosis not present

## 2021-07-30 DIAGNOSIS — J9621 Acute and chronic respiratory failure with hypoxia: Secondary | ICD-10-CM | POA: Diagnosis not present

## 2021-07-30 DIAGNOSIS — I2510 Atherosclerotic heart disease of native coronary artery without angina pectoris: Secondary | ICD-10-CM | POA: Diagnosis not present

## 2021-07-30 DIAGNOSIS — G893 Neoplasm related pain (acute) (chronic): Secondary | ICD-10-CM | POA: Diagnosis not present

## 2021-07-30 DIAGNOSIS — J910 Malignant pleural effusion: Secondary | ICD-10-CM | POA: Diagnosis not present

## 2021-07-30 DIAGNOSIS — J9811 Atelectasis: Secondary | ICD-10-CM | POA: Diagnosis not present

## 2021-07-30 DIAGNOSIS — Z66 Do not resuscitate: Secondary | ICD-10-CM | POA: Diagnosis not present

## 2021-07-30 DIAGNOSIS — R918 Other nonspecific abnormal finding of lung field: Secondary | ICD-10-CM | POA: Diagnosis not present

## 2021-07-30 DIAGNOSIS — C3432 Malignant neoplasm of lower lobe, left bronchus or lung: Secondary | ICD-10-CM | POA: Diagnosis not present

## 2021-07-30 DIAGNOSIS — R059 Cough, unspecified: Secondary | ICD-10-CM | POA: Diagnosis not present

## 2021-07-30 DIAGNOSIS — C3490 Malignant neoplasm of unspecified part of unspecified bronchus or lung: Secondary | ICD-10-CM | POA: Diagnosis not present

## 2021-07-30 DIAGNOSIS — I420 Dilated cardiomyopathy: Secondary | ICD-10-CM | POA: Diagnosis not present

## 2021-07-30 DIAGNOSIS — C3412 Malignant neoplasm of upper lobe, left bronchus or lung: Secondary | ICD-10-CM | POA: Diagnosis not present

## 2021-07-30 DIAGNOSIS — Z7189 Other specified counseling: Secondary | ICD-10-CM | POA: Diagnosis not present

## 2021-07-30 DIAGNOSIS — Z9981 Dependence on supplemental oxygen: Secondary | ICD-10-CM | POA: Diagnosis not present

## 2021-07-30 DIAGNOSIS — E785 Hyperlipidemia, unspecified: Secondary | ICD-10-CM | POA: Diagnosis not present

## 2021-07-30 DIAGNOSIS — I252 Old myocardial infarction: Secondary | ICD-10-CM | POA: Diagnosis not present

## 2021-07-30 DIAGNOSIS — Z85118 Personal history of other malignant neoplasm of bronchus and lung: Secondary | ICD-10-CM | POA: Diagnosis not present

## 2021-07-30 DIAGNOSIS — Z9889 Other specified postprocedural states: Secondary | ICD-10-CM | POA: Diagnosis not present

## 2021-07-30 DIAGNOSIS — Z951 Presence of aortocoronary bypass graft: Secondary | ICD-10-CM | POA: Diagnosis not present

## 2021-07-30 DIAGNOSIS — R69 Illness, unspecified: Secondary | ICD-10-CM | POA: Diagnosis not present

## 2021-07-30 DIAGNOSIS — C3492 Malignant neoplasm of unspecified part of left bronchus or lung: Secondary | ICD-10-CM | POA: Diagnosis not present

## 2021-07-30 DIAGNOSIS — Z515 Encounter for palliative care: Secondary | ICD-10-CM | POA: Diagnosis not present

## 2021-07-30 DIAGNOSIS — R0602 Shortness of breath: Secondary | ICD-10-CM | POA: Diagnosis not present

## 2021-07-30 DIAGNOSIS — Z87891 Personal history of nicotine dependence: Secondary | ICD-10-CM | POA: Diagnosis not present

## 2021-07-30 DIAGNOSIS — Z4682 Encounter for fitting and adjustment of non-vascular catheter: Secondary | ICD-10-CM | POA: Diagnosis not present

## 2021-07-30 DIAGNOSIS — I10 Essential (primary) hypertension: Secondary | ICD-10-CM | POA: Diagnosis not present

## 2021-07-30 DIAGNOSIS — J449 Chronic obstructive pulmonary disease, unspecified: Secondary | ICD-10-CM | POA: Diagnosis not present

## 2021-07-30 DIAGNOSIS — J90 Pleural effusion, not elsewhere classified: Secondary | ICD-10-CM | POA: Diagnosis not present

## 2021-07-31 LAB — SURGICAL PATHOLOGY

## 2021-07-31 LAB — CYTOLOGY - NON PAP

## 2021-08-02 ENCOUNTER — Encounter: Payer: Self-pay | Admitting: *Deleted

## 2021-08-02 ENCOUNTER — Other Ambulatory Visit: Payer: Self-pay

## 2021-08-02 ENCOUNTER — Ambulatory Visit
Admission: RE | Admit: 2021-08-02 | Discharge: 2021-08-02 | Disposition: A | Discharge status: Home / Self Care | Payer: Medicare HMO | Source: Ambulatory Visit | Attending: Radiation Oncology | Admitting: Radiation Oncology

## 2021-08-02 ENCOUNTER — Other Ambulatory Visit: Payer: Self-pay | Admitting: *Deleted

## 2021-08-02 DIAGNOSIS — C3492 Malignant neoplasm of unspecified part of left bronchus or lung: Secondary | ICD-10-CM | POA: Insufficient documentation

## 2021-08-02 DIAGNOSIS — Z87891 Personal history of nicotine dependence: Secondary | ICD-10-CM | POA: Diagnosis not present

## 2021-08-02 DIAGNOSIS — C3412 Malignant neoplasm of upper lobe, left bronchus or lung: Secondary | ICD-10-CM | POA: Diagnosis not present

## 2021-08-02 LAB — RAD ONC ARIA SESSION SUMMARY
Course Elapsed Days: 0
Plan Fractions Treated to Date: 1
Plan Prescribed Dose Per Fraction: 2 Gy
Plan Total Fractions Prescribed: 30
Plan Total Prescribed Dose: 60 Gy
Reference Point Dosage Given to Date: 2 Gy
Reference Point Session Dosage Given: 2 Gy
Session Number: 1

## 2021-08-02 NOTE — Progress Notes (Signed)
I contacted Dr. Sondra Come with an update on cancer conference discussion on Ms. Vanatta case today.

## 2021-08-02 NOTE — Progress Notes (Signed)
The proposed treatment discussed in cancer conference is for discussion purpose only and is not a binding recommendation.  The patient was not physically examined nor present for their treatment plan. Therefore, final treatment plans cannot be decided.

## 2021-08-02 NOTE — Progress Notes (Signed)
Radiation Oncology         [phone] ________________________________  Outpatient Re-Consultation  Name: [name] MRN: [id number]  Date: 08/02/2021  DOB: [birth year]  CC:[name], NP  Melrose Nakayama, *   REFERRING PHYSICIAN: Melrose Nakayama, *  DIAGNOSIS: The encounter diagnosis was Non-small cell cancer of left lung ([place]).  Invasive moderately differentiated squamous cell carcinoma of the LLL, p63 positive  PET positive pulmonary nodule presenting in the left lower lobe, clinical stage IA2 (T1b, N0, M0)   (2m subpleural nodule in the left lower lobe, suspicious for primary bronchogenic carcinoma)   Now with biopsy proven non-small cell lung cancer in the left hilar region  Interval Since Last Radiation: 6 months and 2 days    Intent: Curative  Radiation Treatment Dates: 01/16/2021 through 01/30/2021 Site Technique Total Dose (Gy) Dose per Fx (Gy) Completed Fx Beam Energies  Lung, Left: Lung_Lt IMRT 55/55 11 5/5 6XFFF    HPI / Narrative ::MLAKISA LOTZis a 76y.o. female who returns today for discussion and consideration of emergent radiation therapy in management of her recurrent left lung cancer. I last met with the patient for follow-up on 06/26/21. To review from our last visit, PET imaging from 06/08/21 showed an increasing hypermetabolic soft tissue density in and around the left hilum, concerning for recurrent disease. We discussed proceeding with bronchoscopy with biopsies of the area, and potentially ultra hypofractionated accelerated radiation therapy to the area. The [place] last visit. I advised her that delaying initiation of further evaluation and potential treatment could be risky given how rapidly growing her tumor was, however the patient did not wish to change her mind. Given her decision, I advised the patient to present to the ED if her  breathing starts deteriorating rapidly.   [place] [facility].  Accordingly, the patient was seen by Dr. HRoxan Hockeyand proceeded to undergo bronchoscopy and LLL biopsies on 07/27/21. Final pathology revealed invasive moderately differentiated squamous cell carcinoma of the LLL, p63 positive. FNA of the lymph nodes 10L and 7 were negative.   Of note: Pre-op CXR on 07/25/21 showed increased densities in the left chest most compatible with volume loss and probably pleural fluid. Streaky densities in the left upper lung also noted represented postobstructive changes.  On 07/1021, [facility] with lower extremity swelling following her bronchoscopy. In the ED, she was found to have a large left sided pleural effusion (imaging detailed below) with acute on chronic respiratory failure and subsequently underwent chest tube placement to manage persistent left pneumothorax. Hospital course included 5L supplemental O2, which was eventually tapered down to her baseline of 3L O2, Decadron, and chest tube removal yesterday. The patient was discharged earlier today with plans for emergent radiation treatment. Imaging performed while inpatient is detailed as follows:   - CXR on 07/28/21 showed complete opacification of the left hemithorax  - CTA of the chest on 07/28/21 demonstrated: surgical changes in the left hilum; the soft tissue density around the left hilum and filling defects in the distal left mainstem bronchus suspicious for malignancy; complete collapse of the left lung; large irregular left pleural effusion; and mild mediastinal lymphadenopathy possibly concerning for metastatic disease.   - CXR on 06/10 following chest tube placement showed a decrease in left  pleural effusion and increasing right pulmonary interstitial infiltrate, possibly reflective of edema.    - CXR on  06/11 redemonstrated near complete opacification of the left lung, with worsening aeration of the left upper lung.   - CXR on 06/13 showed slightly worsened aeration of the right base, persistent pulmonary vascular congestion, and little change of the near complete opacification of the left hemithorax  -CXR on 06/14 showed unchanged total opacification of left hemithorax compatible with a large pleural effusion.  Improved aeration at the right lung base was also noted.    PAST MEDICAL HISTORY:  Past Medical History:  Diagnosis Date   CAD (coronary artery disease)    Cancer ([place])    COPD (chronic obstructive pulmonary disease) ([place])    per DM note, pt denies   Dyslipidemia    GERD (gastroesophageal reflux disease)    History of radiation therapy    Left lung- 01/16/21-01/30/21- Dr. Gery Pray   HTN (hypertension)    Hypothyroidism    Myocardial infarct [facility])    Non-small cell lung cancer ([place]) 08/19/2010   Stage IA, status post left upper lobectomy July 2012   Tobacco abuse     PAST SURGICAL HISTORY: Past Surgical History:  Procedure Laterality Date   CARDIAC CATHETERIZATION  07/06/2010   see CABG report - pt sent to OR   CARDIOVASCULAR STRESS TEST  09/11/2010   R/S MV - normal perfusion in all regions, EF 46%, no scintigraphic evidence of inducible myocardial ischemia; global LV systolic function mildly reduced; no significant wall motion abnormalities noted; Exercise capacity 7 METS; EKG negative for ischemia; low risk study, no signifcant change from previous study 08/2003   CORONARY ARTERY BYPASS GRAFT  07/11/2010   LIMA to LAD; SVG to 2nd branch OM; SVG to posterior descending artery   LEFT VATS  09/17/2010   TEE WITHOUT CARDIOVERSION  07/06/2010   during emergent CABG surgery; 2-3+ mitral regurgitation   VIDEO BRONCHOSCOPY N/A 07/27/2021   Procedure: VIDEO BRONCHOSCOPY;  Surgeon: Melrose Nakayama, MD;  Location: [facility] OR;  Service: Thoracic;  Laterality: N/A;   VIDEO  BRONCHOSCOPY WITH ENDOBRONCHIAL ULTRASOUND N/A 07/27/2021   Procedure: VIDEO BRONCHOSCOPY WITH ENDOBRONCHIAL ULTRASOUND;  Surgeon: Melrose Nakayama, MD;  Location: [facility] OR;  Service: Thoracic;  Laterality: N/A;    FAMILY HISTORY:  Family History  Problem Relation Age of Onset   Hypothyroidism Father    Heart attack Father    Aplastic anemia Maternal Grandfather    Stroke Paternal Grandfather     SOCIAL HISTORY:  Social History   Tobacco Use   Smoking status: Every Day    Packs/day: 1.00    Years: 58.00    Total pack years: 58.00    Types: Cigarettes    Last attempt to quit: 03/21/2020    Years since quitting: 1.3   Smokeless tobacco: Never   Tobacco comments:    Pt states she is currently smoking  Vaping Use   Vaping Use: Never used  Substance Use Topics   Alcohol use: No    Alcohol/week: 0.0 standard drinks of alcohol   Drug use: No    ALLERGIES:  Allergies  Allergen Reactions   Codeine Other (See Comments)    Unknown reaction Patient avoids this medication    MEDICATIONS:  Current Outpatient Medications  Medication Sig Dispense Refill   albuterol (PROAIR HFA) 108 (90 Base) MCG/ACT inhaler 2 puffs up to every 4 hours as needed only  if your can't catch your breath (Patient taking  differently: Inhale 2 puffs into the lungs every 4 (four) hours as needed for shortness of breath.)     albuterol (PROVENTIL) (2.5 MG/3ML) 0.083% nebulizer solution Take 2.5 mg by nebulization 3 (three) times daily as needed for wheezing or shortness of breath.     amoxicillin-clavulanate (AUGMENTIN) 875-125 MG tablet Take 1 tablet by mouth 2 (two) times daily. 10 day course. On day 6 as of 07/25/21     aspirin 81 MG tablet Take 81 mg by mouth daily.     Coenzyme Q10 (CO Q 10 PO) Take 1 capsule by mouth daily.     dextromethorphan-guaiFENesin (MUCINEX DM) 30-600 MG 12hr tablet Take 1 tablet by mouth as needed for cough.     fluticasone (FLONASE) 50 MCG/ACT nasal spray Place 2 sprays into  both nostrils as needed. 16 g 5   furosemide (LASIX) 20 MG tablet Take 1 tablet (20 mg total) by mouth daily. 90 tablet 1   ibuprofen (ADVIL) 200 MG tablet Take 200 mg by mouth daily as needed for mild pain.     levothyroxine (SYNTHROID) 25 MCG tablet TAKE 0.5 TABLETS (12.5 MCG TOTAL) BY MOUTH DAILY. IN ADDITION TO THE 50 MCG. (Patient taking differently: Take 12.5 mcg by mouth daily.) 45 tablet 1   levothyroxine (SYNTHROID) 50 MCG tablet TAKE 1 TABLET BY MOUTH EVERY DAY BEFORE BREAKFAST (Patient taking differently: Take 50 mcg by mouth daily.) 90 tablet 3   metoprolol succinate (TOPROL-XL) 100 MG 24 hr tablet Take 1 tablet (100 mg total) by mouth daily. Take with or immediately following a meal. (Patient taking differently: Take 100 mg by mouth daily.) 90 tablet 1   naproxen sodium (ALEVE) 220 MG tablet Take 220 mg by mouth daily as needed (pain).     Omega-3 Fatty Acids (OMEGA-3 PO) Take 1 capsule by mouth daily.     OXYGEN Inhale 2 L into the lungs as needed.     predniSONE (DELTASONE) 20 MG tablet Take 60 mg by mouth daily. 5 day course. Last day 07/25/21.     rosuvastatin (CRESTOR) 40 MG tablet Take 1 tablet (40 mg total) by mouth daily. 90 tablet 3   No current facility-administered medications for this encounter.    REVIEW OF SYSTEMS:  A 10+ POINT REVIEW OF SYSTEMS WAS OBTAINED including neurology, dermatology, psychiatry, cardiac, respiratory, lymph, extremities, GI, GU, musculoskeletal, constitutional, reproductive, HEENT.  She reports improvement in her breathing since admission to [facility].  She denies any hemoptysis but does have frequent dry coughing.   PHYSICAL EXAM:  General: Alert and oriented, in no acute distress, supplemental oxygen in place at 4 L HEENT: Head is normocephalic. Extraocular movements are intact. Oropharynx is clear. Neck: Neck is supple, no palpable cervical or supraclavicular lymphadenopathy. Heart: Regular in rate and rhythm with no murmurs, rubs, or  gallops. Chest: Decreased breath sounds in the left lung field.  Large bandage over the left chest which was removed and revealed healing scar in the lateral chest from recent chest tube insertion and removal. Abdomen: Soft, nontender, nondistended, with no rigidity or guarding. Extremities: No cyanosis or edema. Lymphatics: see Neck Exam Skin: No concerning lesions. Musculoskeletal: symmetric strength and muscle tone throughout. Neurologic: Cranial nerves II through XII are grossly intact. No obvious focalities. Speech is fluent. Coordination is intact. Psychiatric: Judgment and insight are intact. Affect is appropriate.   ECOG = 2  0 - Asymptomatic (Fully active, able to carry on all predisease activities without restriction)  1 - Symptomatic but completely  ambulatory (Restricted in physically strenuous activity but ambulatory and able to carry out work of a light or sedentary nature. For example, light housework, office work)  2 - Symptomatic, <50% in bed during the day (Ambulatory and capable of all self care but unable to carry out any work activities. Up and about more than 50% of waking hours)  3 - Symptomatic, >50% in bed, but not bedbound (Capable of only limited self-care, confined to bed or chair 50% or more of waking hours)  4 - Bedbound (Completely disabled. Cannot carry on any self-care. Totally confined to bed or chair)  5 - Death   Eustace Pen MM, Creech RH, Tormey DC, et al. [phone]). "Toxicity and response criteria of the [facility]". [place] Oncol. 5 (6): 649-55  LABORATORY DATA:  Lab Results  Component Value Date   WBC 15.7 (H) 07/25/2021   HGB 12.6 07/25/2021   HCT 43.1 07/25/2021   MCV 91.9 07/25/2021   PLT 254 07/25/2021   NEUTROABS 5.2 10/25/2020   Lab Results  Component Value Date   NA 140 07/25/2021   K 3.7 07/25/2021   CL 97 (L) 07/25/2021   CO2 37 (H) 07/25/2021   GLUCOSE 138 (H) 07/25/2021   BUN 11 07/25/2021   CREATININE  0.92 07/25/2021   CALCIUM 8.3 (L) 07/25/2021      RADIOGRAPHY: DG Chest 2 View  Result Date: 07/26/2021 CLINICAL DATA:  Pre-op for video bronchoscopy. EXAM: CHEST - 2 VIEW COMPARISON:  Chest radiograph 07/03/2020.  Head CT 06/08/2021 FINDINGS: Markedly increased densities in the mid and lower chest are suggestive for volume loss and probable left pleural fluid. Streaky densities in left upper lung could represent postobstructive changes. Again noted are past CABG changes. Right lung remains clear. Negative for pneumothorax. Left cardiac border is obscured by the left chest densities. IMPRESSION: Increased densities in the left chest are most compatible with volume loss and probably pleural fluid. Streaky densities in the left upper lung could also represent postobstructive changes. Electronically Signed   By: Markus Daft M.D.   On: 07/26/2021 21:16      IMPRESSION: Invasive moderately differentiated squamous cell carcinoma of the LLL, p63 positive  PET positive pulmonary nodule presenting in the left lower lobe, clinical stage IA2 (T1b, N0, M0)   (10m subpleural nodule in the left lower lobe, suspicious for primary bronchogenic carcinoma)   Now with biopsy proven non-small cell lung cancer in the left hilar region  She would be a good candidate for urgent radiation therapy in light of her bronchial obstruction and respiratory situation.  I discussed the situation in detail with the patient her husband and daughter.  We discussed 2 options for treatment 1 would be to proceed with short course of radiation treatment directed at the left hilar area approximately 10 treatments, more of a palliative approach.  We also discussed more comprehensive course of treatment with 6 weeks of radiation therapy along with radiosensitizing chemotherapy.  She does understand that there will be overlap some with her previous SBRT treatments but given the severity of the situation urgent treatment is recommended.  She  agrees with radiation treatments.  Patient's case was presented at the multidisciplinary conference earlier today and recommendations were consideration for radiation along with radiosensitizing chemotherapy.  We discussed this with the patient and at this point she wishes to proceed with a more definitive course of treatment.  Urgent referral placed to Dr. MJulien Nordmannto initiate radiosensitizing chemotherapy.  We discussed the  planned course of treatment anticipated side effects and potential long-term toxicities of left chest radiation therapy.  The patient appears to understand and wishes to proceed with planned course of treatment.  PLAN: She will proceed with CT simulation later this morning.  Treatments began this afternoon.  She will receive 2 treatments before the weekend and then resume radiation therapy early next week.  Radiosensitizing chemotherapy will start as soon as possible.  Patient understands that if her breathing worsens over the weekend she should present to the [facility] emergency room to be considered for admission and to have urgent radiation therapy over the weekend.   35 minutes of total time was spent for this patient encounter, including preparation, face-to-face counseling with the patient and coordination of care, physical exam, and documentation of the encounter.   ------------------------------------------------  [name], PhD, MD  This document serves as a record of services personally performed by Gery Pray, MD. It was created on his behalf by Roney Mans, a trained medical scribe. The creation of this record is based on the scribe's personal observations and the provider's statements to them. This document has been checked and approved by the attending provider.

## 2021-08-03 ENCOUNTER — Other Ambulatory Visit: Payer: Self-pay

## 2021-08-03 ENCOUNTER — Telehealth: Payer: Self-pay | Admitting: Internal Medicine

## 2021-08-03 ENCOUNTER — Encounter: Payer: Self-pay | Admitting: *Deleted

## 2021-08-03 ENCOUNTER — Ambulatory Visit
Admission: RE | Admit: 2021-08-03 | Discharge: 2021-08-03 | Disposition: A | Discharge status: Home / Self Care | Payer: Medicare HMO | Source: Ambulatory Visit | Attending: Radiation Oncology | Admitting: Radiation Oncology

## 2021-08-03 DIAGNOSIS — Z510 Encounter for antineoplastic radiation therapy: Secondary | ICD-10-CM | POA: Diagnosis not present

## 2021-08-03 DIAGNOSIS — Z87891 Personal history of nicotine dependence: Secondary | ICD-10-CM | POA: Diagnosis not present

## 2021-08-03 DIAGNOSIS — C3412 Malignant neoplasm of upper lobe, left bronchus or lung: Secondary | ICD-10-CM | POA: Diagnosis not present

## 2021-08-03 DIAGNOSIS — C3492 Malignant neoplasm of unspecified part of left bronchus or lung: Secondary | ICD-10-CM | POA: Diagnosis not present

## 2021-08-03 LAB — RAD ONC ARIA SESSION SUMMARY
Course Elapsed Days: 1
Plan Fractions Treated to Date: 2
Plan Prescribed Dose Per Fraction: 2 Gy
Plan Total Fractions Prescribed: 30
Plan Total Prescribed Dose: 60 Gy
Reference Point Dosage Given to Date: 4 Gy
Reference Point Session Dosage Given: 2 Gy
Session Number: 2

## 2021-08-03 NOTE — Progress Notes (Unsigned)
[facility] [phone]   [phone]) [phone]  CONSULT NOTE  REFERRING PHYSICIAN: Dr. Sondra Come   REASON FOR CONSULTATION:  Recurrent non-small cell lung cancer, squamous cell carcinoma  HPI [name] is a 76 y.o. female with a past medical history significant for hypertension, myocardial infarction with emergent coronary bypass grafting, congestive heart failure, atrial fibrillation, COPD on supplemental oxygen, GERD, hypothyroidism, dyslipidemia, and ***is referred to the clinic for recurrent non-small cell lung cancer, squamous cell carcinoma.  The patient has a history of stage Ia non-small cell lung cancer, adenocarcinoma which was resected on 08/21/2010 under the care of Dr. Roxan Hockey during an emergent CABG.  She had been followed by Dr. Roxan Hockey for some time.    Patient had a follow-up CT scan on 11/03/2020 which showed a new nodular 9 mm subpleural lesion in the left lower lobe which is suspicious for recurrent disease.  Due to poor lung function, Dr. Roxan Hockey did not feel that she is a good surgical candidate due to her limited pulmonary reserve.  She had a PET scan on 12/22/2018 which showed hypermetabolism corresponding to the left lower lobe pulmonary nodule which was consistent with metachronous primary bronchogenic carcinoma.  There is no evidence of thoracic nodal or hypermetabolic extrathoracic metastases at that time.  Therefore, the patient was referred to Dr. Sondra Come from radiation oncology in November 2022.  He underwent radiation from 01/16/2021 to 01/30/2021.   The patient had a chest x-ray performed on***which showed that the treated left lower lobe pulmonary nodule was improved but there was increasing soft tissue density around the left hilum which is concerning for possible recurrence.  She then had a CT scan 06/06/2021 which showed increasing density in the hilar area concerning for disease recurrence.  She subsequently had a PET  scan on 06/08/2021 that showed increasing soft tissue density in and around the left hilum which was markedly hypermetabolic with an SUV max of 27.2.  The PET otherwise showed no other evidence of hypermetabolic mediastinal or right hilar adenopathy or any other suspicious findings in the neck, abdomen, or pelvis.   Her breathing has gotten worse in the interval and she was placed on supplemental oxygen.  She saw Dr. Roxan Hockey who performed bronchoscopy and biopsy on 07/24/21.   The final pathology showed invasive moderately differentiated squamous cell carcinoma of the left lower lobe the FNA of the 10 L and 7 were negative.  The patient presented to the ER on 07/28/2021 at [facility] for lower extremity swelling and large left pleural effusion with acute on chronic respiratory failure.  She subsequently had a chest tube placement for persistent left pneumothorax.  CT of the chest performed on 07/28/2021 showed surgical changes in the left hilum, soft tissue density around the left hilum and filling defect of the distal left mainstem bronchus suspicious for malignancy and complete collapse of the left lung.  There was a large irregular left pleural effusion and mild mediastinal lymphadenopathy possibly concerning for metastatic disease.  She underwent a thoracentesis and the cytology showed reactive mesothelial cells, marked acute inflammation and red blood cells.  Negative for malignancy.  The patient's case was discussed at the multidisciplinary conference on 08/02/2021.  It was decided to undergo radiation along with radiosensitizing chemotherapy.  Dr. Sondra Come placed an urgent referral due to complete obstruction of the bronchus.  PD-L1 testing was requested on ***.   Overall, the patient is feeling ***today.  HPI  Past Medical History:  Diagnosis Date  CAD (coronary artery disease)    Cancer (HCC)    COPD (chronic obstructive pulmonary disease) ([place])    per DM note, pt denies   Dyslipidemia     GERD (gastroesophageal reflux disease)    History of radiation therapy    Left lung- 01/16/21-01/30/21- Dr. Gery Pray   HTN (hypertension)    Hypothyroidism    Myocardial infarct [facility])    Non-small cell lung cancer ([place]) 08/19/2010   Stage IA, status post left upper lobectomy July 2012   Tobacco abuse     Past Surgical History:  Procedure Laterality Date   CARDIAC CATHETERIZATION  07/06/2010   see CABG report - pt sent to OR   CARDIOVASCULAR STRESS TEST  09/11/2010   R/S MV - normal perfusion in all regions, EF 46%, no scintigraphic evidence of inducible myocardial ischemia; global LV systolic function mildly reduced; no significant wall motion abnormalities noted; Exercise capacity 7 METS; EKG negative for ischemia; low risk study, no signifcant change from previous study 08/2003   CORONARY ARTERY BYPASS GRAFT  07/11/2010   LIMA to LAD; SVG to 2nd branch OM; SVG to posterior descending artery   LEFT VATS  09/17/2010   TEE WITHOUT CARDIOVERSION  07/06/2010   during emergent CABG surgery; 2-3+ mitral regurgitation   VIDEO BRONCHOSCOPY N/A 07/27/2021   Procedure: VIDEO BRONCHOSCOPY;  Surgeon: Melrose Nakayama, MD;  Location: [facility] OR;  Service: Thoracic;  Laterality: N/A;   VIDEO BRONCHOSCOPY WITH ENDOBRONCHIAL ULTRASOUND N/A 07/27/2021   Procedure: VIDEO BRONCHOSCOPY WITH ENDOBRONCHIAL ULTRASOUND;  Surgeon: Melrose Nakayama, MD;  Location: [facility] OR;  Service: Thoracic;  Laterality: N/A;    Family History  Problem Relation Age of Onset   Hypothyroidism Father    Heart attack Father    Aplastic anemia Maternal Grandfather    Stroke Paternal Grandfather     Social History Social History   Tobacco Use   Smoking status: Every Day    Packs/day: 1.00    Years: 58.00    Total pack years: 58.00    Types: Cigarettes    Last attempt to quit: 03/21/2020    Years since quitting: 1.3   Smokeless tobacco: Never   Tobacco comments:    Pt states she is currently smoking  Vaping Use    Vaping Use: Never used  Substance Use Topics   Alcohol use: No    Alcohol/week: 0.0 standard drinks of alcohol   Drug use: No    Allergies  Allergen Reactions   Codeine Other (See Comments)    Unknown reaction Patient avoids this medication    Current Outpatient Medications  Medication Sig Dispense Refill   albuterol (PROAIR HFA) 108 (90 Base) MCG/ACT inhaler 2 puffs up to every 4 hours as needed only  if your can't catch your breath (Patient taking differently: Inhale 2 puffs into the lungs every 4 (four) hours as needed for shortness of breath.)     albuterol (PROVENTIL) (2.5 MG/3ML) 0.083% nebulizer solution Take 2.5 mg by nebulization 3 (three) times daily as needed for wheezing or shortness of breath.     amoxicillin-clavulanate (AUGMENTIN) 875-125 MG tablet Take 1 tablet by mouth 2 (two) times daily. 10 day course. On day 6 as of 07/25/21     aspirin 81 MG tablet Take 81 mg by mouth daily.     Coenzyme Q10 (CO Q 10 PO) Take 1 capsule by mouth daily.     dextromethorphan-guaiFENesin (MUCINEX DM) 30-600 MG 12hr tablet Take 1 tablet by mouth as  needed for cough.     fluticasone (FLONASE) 50 MCG/ACT nasal spray Place 2 sprays into both nostrils as needed. 16 g 5   furosemide (LASIX) 20 MG tablet Take 1 tablet (20 mg total) by mouth daily. 90 tablet 1   ibuprofen (ADVIL) 200 MG tablet Take 200 mg by mouth daily as needed for mild pain.     levothyroxine (SYNTHROID) 25 MCG tablet TAKE 0.5 TABLETS (12.5 MCG TOTAL) BY MOUTH DAILY. IN ADDITION TO THE 50 MCG. (Patient taking differently: Take 12.5 mcg by mouth daily.) 45 tablet 1   levothyroxine (SYNTHROID) 50 MCG tablet TAKE 1 TABLET BY MOUTH EVERY DAY BEFORE BREAKFAST (Patient taking differently: Take 50 mcg by mouth daily.) 90 tablet 3   metoprolol succinate (TOPROL-XL) 100 MG 24 hr tablet Take 1 tablet (100 mg total) by mouth daily. Take with or immediately following a meal. (Patient taking differently: Take 100 mg by mouth daily.) 90 tablet  1   naproxen sodium (ALEVE) 220 MG tablet Take 220 mg by mouth daily as needed (pain).     Omega-3 Fatty Acids (OMEGA-3 PO) Take 1 capsule by mouth daily.     OXYGEN Inhale 2 L into the lungs as needed.     predniSONE (DELTASONE) 20 MG tablet Take 60 mg by mouth daily. 5 day course. Last day 07/25/21.     rosuvastatin (CRESTOR) 40 MG tablet Take 1 tablet (40 mg total) by mouth daily. 90 tablet 3   No current facility-administered medications for this visit.    REVIEW OF SYSTEMS:   Review of Systems  Constitutional: Negative for appetite change, chills, fatigue, fever and unexpected weight change.  HENT:   Negative for mouth sores, nosebleeds, sore throat and trouble swallowing.   Eyes: Negative for eye problems and icterus.  Respiratory: Negative for cough, hemoptysis, shortness of breath and wheezing.   Cardiovascular: Negative for chest pain and leg swelling.  Gastrointestinal: Negative for abdominal pain, constipation, diarrhea, nausea and vomiting.  Genitourinary: Negative for bladder incontinence, difficulty urinating, dysuria, frequency and hematuria.   Musculoskeletal: Negative for back pain, gait problem, neck pain and neck stiffness.  Skin: Negative for itching and rash.  Neurological: Negative for dizziness, extremity weakness, gait problem, headaches, light-headedness and seizures.  Hematological: Negative for adenopathy. Does not bruise/bleed easily.  Psychiatric/Behavioral: Negative for confusion, depression and sleep disturbance. The patient is not nervous/anxious.     PHYSICAL EXAMINATION:  There were no vitals taken for this visit.  ECOG PERFORMANCE STATUS: {CHL ONC ECOG [id number]  Physical Exam  Constitutional: Oriented to person, place, and time and well-developed, well-nourished, and in no distress. No distress.  HENT:  Head: Normocephalic and atraumatic.  Mouth/Throat: Oropharynx is clear and moist. No oropharyngeal exudate.  Eyes: Conjunctivae are normal.  Right eye exhibits no discharge. Left eye exhibits no discharge. No scleral icterus.  Neck: Normal range of motion. Neck supple.  Cardiovascular: Normal rate, regular rhythm, normal heart sounds and intact distal pulses.   Pulmonary/Chest: Effort normal and breath sounds normal. No respiratory distress. No wheezes. No rales.  Abdominal: Soft. Bowel sounds are normal. Exhibits no distension and no mass. There is no tenderness.  Musculoskeletal: Normal range of motion. Exhibits no edema.  Lymphadenopathy:    No cervical adenopathy.  Neurological: Alert and oriented to person, place, and time. Exhibits normal muscle tone. Gait normal. Coordination normal.  Skin: Skin is warm and dry. No rash noted. Not diaphoretic. No erythema. No pallor.  Psychiatric: Mood, memory and judgment normal.  Vitals reviewed.  LABORATORY DATA: Lab Results  Component Value Date   WBC 15.7 (H) 07/25/2021   HGB 12.6 07/25/2021   HCT 43.1 07/25/2021   MCV 91.9 07/25/2021   PLT 254 07/25/2021      Chemistry      Component Value Date/Time   NA 140 07/25/2021 1524   NA 143 05/02/2021 1055   K 3.7 07/25/2021 1524   CL 97 (L) 07/25/2021 1524   CO2 37 (H) 07/25/2021 1524   BUN 11 07/25/2021 1524   BUN 13 05/02/2021 1055   CREATININE 0.92 07/25/2021 1524   CREATININE 0.81 07/02/2016 1323      Component Value Date/Time   CALCIUM 8.3 (L) 07/25/2021 1524   ALKPHOS 67 07/25/2021 1524   AST 36 07/25/2021 1524   ALT 105 (H) 07/25/2021 1524   BILITOT 0.6 07/25/2021 1524   BILITOT 0.2 05/02/2021 1055       RADIOGRAPHIC STUDIES: DG Chest 2 View  Result Date: 07/26/2021 CLINICAL DATA:  Pre-op for video bronchoscopy. EXAM: CHEST - 2 VIEW COMPARISON:  Chest radiograph 07/03/2020.  Head CT 06/08/2021 FINDINGS: Markedly increased densities in the mid and lower chest are suggestive for volume loss and probable left pleural fluid. Streaky densities in left upper lung could represent postobstructive changes. Again noted  are past CABG changes. Right lung remains clear. Negative for pneumothorax. Left cardiac border is obscured by the left chest densities. IMPRESSION: Increased densities in the left chest are most compatible with volume loss and probably pleural fluid. Streaky densities in the left upper lung could also represent postobstructive changes. Electronically Signed   By: Markus Daft M.D.   On: 07/26/2021 21:16    ASSESSMENT: This is a very pleasant 76 year old female with: 1) stage Ia non-small cell lung cancer, adenocarcinoma which was resected in July 2012 under the care of Dr. Roxan Hockey.  2) Recurrent non-small cell lung cancer, squamous cell carcinoma initially diagnosed as stage I A2 (T1b, N0, M0) status post SBRT from 01/16/2021 to 01/30/2021.  She was found to have increasing soft tissue density in and around the hilum.  She had progressive obstruction of the distal left mainstem bronchus and complete collapse of the left lung.   The patient was seen with Dr. Julien Nordmann today.  Dr. Julien Nordmann had a lengthy discussion with the patient today about her current condition and recommended treatment options.  This patient's case was discussed at the multidisciplinary conference on 08/02/2021.  The patient is being seen by Dr. Sondra Come.  It was decided at the multidisciplinary conference to undergo radiation along with radiosensitizing chemotherapy.  PD-L1 expression was recommended.  Dr. Julien Nordmann recommends a brain MRI to complete the staging work-up.  Dr. Julien Nordmann discussed the options with the patient today.  Dr. Julien Nordmann discussed that chemotherapy would include carboplatin for an AUC of 2 and paclitaxel 45 mg per metered squared weekly for approximately 6 weeks.  The patient is interested in this option and we will plan to have her first dose of chemotherapy on 04/15/2021.  The adverse side effects of treatment were discussed including but not limited to alopecia, peripheral neuropathy, nausea, vomiting, kidney, and  liver dysfunction.  We will arrange for the patient to have a chemo education class prior to starting her first cycle of treatment.  I have sent a prescription for Compazine 10 mg p.o. every 6 hours as needed to the patient's pharmacy.  We will see the patient back for follow-up visit in 2 weeks for evaluation and repeat blood  work before undergoing cycle #2.  Breathing changes go to the ER?  Supplemental oxygen  Of note the patient had a thoracentesis performed while in [facility] which was negative for any malignant cells.  We will continue to monitor and arrange for cytology on any follow-up thoracentesis.****   The patient voices understanding of current disease status and treatment options and is in agreement with the current care plan.  All questions were answered. The patient knows to call the clinic with any problems, questions or concerns. We can certainly see the patient much sooner if necessary.  Thank you so much for allowing me to participate in the care of [name]. I will continue to follow up the patient with you and assist in her care.  I spent {CHL ONC TIME VISIT - JQDUK:[id number]} counseling the patient face to face. The total time spent in the appointment was {CHL ONC TIME VISIT - VOHKG:[id number]}.  Disclaimer: This note was dictated with voice recognition software. Similar sounding words can inadvertently be transcribed and may not be corrected upon review.   Indiah Heyden L Eragon Hammond August 03, 2021, 11:00 AM

## 2021-08-03 NOTE — Progress Notes (Signed)
I received referral on [name] today.  I updated new patient coordinator to call and schedule her to be seen next week with Cassie.

## 2021-08-03 NOTE — Telephone Encounter (Signed)
Scheduled appt per 6/15 referral. Pt is aware of appt date and time. Pt is aware to arrive 15 mins prior to appt time and to bring and updated insurance card. Pt is aware of appt location.

## 2021-08-06 ENCOUNTER — Other Ambulatory Visit: Payer: Self-pay | Admitting: Lab

## 2021-08-06 ENCOUNTER — Other Ambulatory Visit: Payer: Self-pay

## 2021-08-06 ENCOUNTER — Other Ambulatory Visit: Payer: Self-pay | Admitting: Internal Medicine

## 2021-08-06 ENCOUNTER — Ambulatory Visit: Payer: Medicare HMO

## 2021-08-06 ENCOUNTER — Inpatient Hospital Stay: Payer: Medicare HMO | Attending: Physician Assistant | Admitting: Physician Assistant

## 2021-08-06 ENCOUNTER — Telehealth: Payer: Self-pay

## 2021-08-06 ENCOUNTER — Inpatient Hospital Stay: Payer: Medicare HMO

## 2021-08-06 ENCOUNTER — Ambulatory Visit
Admission: RE | Admit: 2021-08-06 | Discharge: 2021-08-06 | Disposition: A | Discharge status: Home / Self Care | Payer: Medicare HMO | Source: Ambulatory Visit | Attending: Radiation Oncology | Admitting: Radiation Oncology

## 2021-08-06 VITALS — BP 93/58 | HR 80 | Temp 97.6°F | Resp 18 | Ht 63.0 in | Wt 208.5 lb

## 2021-08-06 DIAGNOSIS — I509 Heart failure, unspecified: Secondary | ICD-10-CM

## 2021-08-06 DIAGNOSIS — C3412 Malignant neoplasm of upper lobe, left bronchus or lung: Secondary | ICD-10-CM | POA: Diagnosis not present

## 2021-08-06 DIAGNOSIS — R69 Illness, unspecified: Secondary | ICD-10-CM | POA: Diagnosis not present

## 2021-08-06 DIAGNOSIS — Z510 Encounter for antineoplastic radiation therapy: Secondary | ICD-10-CM | POA: Diagnosis not present

## 2021-08-06 DIAGNOSIS — I4891 Unspecified atrial fibrillation: Secondary | ICD-10-CM | POA: Diagnosis not present

## 2021-08-06 DIAGNOSIS — J90 Pleural effusion, not elsewhere classified: Secondary | ICD-10-CM | POA: Insufficient documentation

## 2021-08-06 DIAGNOSIS — Z9981 Dependence on supplemental oxygen: Secondary | ICD-10-CM | POA: Diagnosis not present

## 2021-08-06 DIAGNOSIS — C3492 Malignant neoplasm of unspecified part of left bronchus or lung: Secondary | ICD-10-CM | POA: Insufficient documentation

## 2021-08-06 DIAGNOSIS — J449 Chronic obstructive pulmonary disease, unspecified: Secondary | ICD-10-CM | POA: Diagnosis not present

## 2021-08-06 DIAGNOSIS — C3490 Malignant neoplasm of unspecified part of unspecified bronchus or lung: Secondary | ICD-10-CM | POA: Insufficient documentation

## 2021-08-06 DIAGNOSIS — C3432 Malignant neoplasm of lower lobe, left bronchus or lung: Secondary | ICD-10-CM | POA: Diagnosis not present

## 2021-08-06 DIAGNOSIS — Z5111 Encounter for antineoplastic chemotherapy: Secondary | ICD-10-CM | POA: Diagnosis not present

## 2021-08-06 DIAGNOSIS — Z7189 Other specified counseling: Secondary | ICD-10-CM

## 2021-08-06 DIAGNOSIS — F17210 Nicotine dependence, cigarettes, uncomplicated: Secondary | ICD-10-CM

## 2021-08-06 DIAGNOSIS — I110 Hypertensive heart disease with heart failure: Secondary | ICD-10-CM | POA: Diagnosis not present

## 2021-08-06 DIAGNOSIS — Z87891 Personal history of nicotine dependence: Secondary | ICD-10-CM | POA: Diagnosis not present

## 2021-08-06 LAB — RAD ONC ARIA SESSION SUMMARY
Course Elapsed Days: 4
Plan Fractions Treated to Date: 3
Plan Prescribed Dose Per Fraction: 2 Gy
Plan Total Fractions Prescribed: 30
Plan Total Prescribed Dose: 60 Gy
Reference Point Dosage Given to Date: 6 Gy
Reference Point Session Dosage Given: 2 Gy
Session Number: 3

## 2021-08-06 LAB — CBC WITH DIFFERENTIAL (CANCER CENTER ONLY)
Abs Immature Granulocytes: 0.12 10*3/uL — ABNORMAL HIGH (ref 0.00–0.07)
Basophils Absolute: 0.1 10*3/uL (ref 0.0–0.1)
Basophils Relative: 0 %
Eosinophils Absolute: 0.3 10*3/uL (ref 0.0–0.5)
Eosinophils Relative: 2 %
HCT: 37.4 % (ref 36.0–46.0)
Hemoglobin: 11.2 g/dL — ABNORMAL LOW (ref 12.0–15.0)
Immature Granulocytes: 1 %
Lymphocytes Relative: 11 %
Lymphs Abs: 1.6 10*3/uL (ref 0.7–4.0)
MCH: 26.5 pg (ref 26.0–34.0)
MCHC: 29.9 g/dL — ABNORMAL LOW (ref 30.0–36.0)
MCV: 88.6 fL (ref 80.0–100.0)
Monocytes Absolute: 1.3 10*3/uL — ABNORMAL HIGH (ref 0.1–1.0)
Monocytes Relative: 9 %
Neutro Abs: 11 10*3/uL — ABNORMAL HIGH (ref 1.7–7.7)
Neutrophils Relative %: 77 %
Platelet Count: 213 10*3/uL (ref 150–400)
RBC: 4.22 MIL/uL (ref 3.87–5.11)
RDW: 15.7 % — ABNORMAL HIGH (ref 11.5–15.5)
WBC Count: 14.4 10*3/uL — ABNORMAL HIGH (ref 4.0–10.5)
nRBC: 0 % (ref 0.0–0.2)

## 2021-08-06 LAB — CMP (CANCER CENTER ONLY)
ALT: 32 U/L (ref 0–44)
AST: 19 U/L (ref 15–41)
Albumin: 3 g/dL — ABNORMAL LOW (ref 3.5–5.0)
Alkaline Phosphatase: 71 U/L (ref 38–126)
Anion gap: 4 — ABNORMAL LOW (ref 5–15)
BUN: 13 mg/dL (ref 8–23)
CO2: 43 mmol/L — ABNORMAL HIGH (ref 22–32)
Calcium: 9.3 mg/dL (ref 8.9–10.3)
Chloride: 92 mmol/L — ABNORMAL LOW (ref 98–111)
Creatinine: 0.8 mg/dL (ref 0.44–1.00)
GFR, Estimated: >60 mL/min (ref >60)
Glucose, Bld: 123 mg/dL — ABNORMAL HIGH (ref 70–99)
Potassium: 3.7 mmol/L (ref 3.5–5.1)
Sodium: 139 mmol/L (ref 135–145)
Total Bilirubin: 0.4 mg/dL (ref 0.3–1.2)
Total Protein: 6.3 g/dL — ABNORMAL LOW (ref 6.5–8.1)

## 2021-08-06 MED ORDER — LIDOCAINE-PRILOCAINE 2.5-2.5 % EX CREA: 1.0000 | TOPICAL_CREAM | CUTANEOUS | 2 refills | Status: DC | PRN

## 2021-08-06 MED ORDER — PROCHLORPERAZINE MALEATE 10 MG PO TABS: 10.0000 mg | ORAL_TABLET | Freq: Four times a day (QID) | ORAL | 2 refills | Status: DC | PRN

## 2021-08-06 MED ORDER — FLUCONAZOLE 100 MG PO TABS: 100.0000 mg | ORAL_TABLET | Freq: Every day | ORAL | 0 refills | Status: DC

## 2021-08-06 NOTE — Progress Notes (Signed)
Pharmacist Chemotherapy Monitoring - Initial Assessment    Anticipated start date: 08/13/21   The following has been reviewed per standard work regarding the patient's treatment regimen: The patient's diagnosis, treatment plan and drug doses, and organ/hematologic function Lab orders and baseline tests specific to treatment regimen  The treatment plan start date, drug sequencing, and pre-medications Prior authorization status  Patient's documented medication list, including drug-drug interaction screen and prescriptions for anti-emetics and supportive care specific to the treatment regimen The drug concentrations, fluid compatibility, administration routes, and timing of the medications to be used The patient's access for treatment and lifetime cumulative dose history, if applicable  The patient's medication allergies and previous infusion related reactions, if applicable   Changes made to treatment plan:  N/A  Follow up needed:  N/A   Philomena Course, [place], 08/06/2021  1:27 PM

## 2021-08-06 NOTE — Progress Notes (Signed)
START ON PATHWAY REGIMEN - Non-Small Cell Lung     Administer weekly:     Paclitaxel      Carboplatin   **Always confirm dose/schedule in your pharmacy ordering system**  Patient Characteristics: Preoperative or Nonsurgical Candidate (Clinical Staging), Stage II, Nonsurgical Candidate Therapeutic Status: Preoperative or Nonsurgical Candidate (Clinical Staging) AJCC T Category: cT3 AJCC N Category: cN0 AJCC M Category: cM0 AJCC 8 Stage Grouping: IIB Intent of Therapy: Curative Intent, Discussed with Patient

## 2021-08-06 NOTE — Telephone Encounter (Signed)
Patient's daughter called and LM regarding appointment for port placement. I called the patient and left her a message informing her that IR will reach out to her after they review the order to get her port placement scheduled.

## 2021-08-06 NOTE — Patient Instructions (Addendum)
-  There are two main categories of lung cancer, they are named based on the size of the cancer cell. One is called Non-Small cell lung cancer. The other type is Small Cell Lung Cancer -The sample (biopsy) that they took of your tumor was consistent with a subtype of Non-small cell lung cancer called Squamous Cell Carcinoma.  -We covered a lot of important information at your appointment today regarding what the treatment plan is moving forward. Here are the the main points that were discussed at your office visit with [place] today:  -The treatment that you will receive consists of two chemotherapy drugs called Carboplatin and Paclitaxel (also called Taxol) -We are planning on starting your treatment next week on 08/13/21 but before your start your treatment, I would like you to attend a Chemotherapy Education Class. This involves having you sit down with one of our nurse educators. She will discuss with you one-on-one more details about your treatment as well as general information about resources here at the [place] treatment will be given every week for about 6 weeks or so (as long as you are also receiving radiation). We will check your labs (blood work) once a week . Dr. Julien Nordmann or I will see you every other treatment just to make sure that you are doing well and that everything is on track. -We will get a CT scan about 3 weeks after you complete your treatment  Medications:  -Compazine was sent to your pharmacy. This medication is for nausea. You may take this every 6 hours as needed if you feel nausous.  -Diflucan: Please take 1 tablet a day for 10 days. Please also use salt water rinses and biotene.   Imaging -I will arrange for a brain MRI to ensure that nothing has traveled to this area. -Please try to hydrate more at home since your blood pressure is low  -You still have some fluid in your lung. If you feel like    Follow Up:  -We will see you back for a follow up visit in 2  weeks before starting your second week of treatment  -[phone] this is the number to the billing department, please call them for your records.

## 2021-08-07 ENCOUNTER — Telehealth: Payer: Self-pay

## 2021-08-07 ENCOUNTER — Other Ambulatory Visit: Payer: Self-pay

## 2021-08-07 ENCOUNTER — Emergency Department (EMERGENCY_DEPARTMENT_HOSPITAL)
Admission: EM | Admit: 2021-08-07 | Discharge: 2021-08-07 | Disposition: A | Discharge status: Home / Self Care | Payer: Medicare HMO | Source: Home / Self Care | Attending: Emergency Medicine | Admitting: Emergency Medicine

## 2021-08-07 ENCOUNTER — Emergency Department (HOSPITAL_COMMUNITY): Payer: Medicare HMO

## 2021-08-07 ENCOUNTER — Telehealth: Payer: Self-pay | Admitting: Critical Care Medicine

## 2021-08-07 ENCOUNTER — Ambulatory Visit
Admission: RE | Admit: 2021-08-07 | Discharge: 2021-08-07 | Disposition: A | Discharge status: Home / Self Care | Payer: Medicare HMO | Source: Ambulatory Visit | Attending: Radiation Oncology | Admitting: Radiation Oncology

## 2021-08-07 ENCOUNTER — Encounter (HOSPITAL_COMMUNITY): Payer: Self-pay | Admitting: Critical Care Medicine

## 2021-08-07 ENCOUNTER — Ambulatory Visit: Payer: Medicare HMO

## 2021-08-07 DIAGNOSIS — D72829 Elevated white blood cell count, unspecified: Secondary | ICD-10-CM | POA: Insufficient documentation

## 2021-08-07 DIAGNOSIS — M7989 Other specified soft tissue disorders: Secondary | ICD-10-CM | POA: Insufficient documentation

## 2021-08-07 DIAGNOSIS — C3412 Malignant neoplasm of upper lobe, left bronchus or lung: Secondary | ICD-10-CM | POA: Diagnosis not present

## 2021-08-07 DIAGNOSIS — C3492 Malignant neoplasm of unspecified part of left bronchus or lung: Secondary | ICD-10-CM | POA: Diagnosis not present

## 2021-08-07 DIAGNOSIS — J9819 Other pulmonary collapse: Secondary | ICD-10-CM

## 2021-08-07 DIAGNOSIS — I471 Supraventricular tachycardia, unspecified: Secondary | ICD-10-CM

## 2021-08-07 DIAGNOSIS — I4892 Unspecified atrial flutter: Secondary | ICD-10-CM | POA: Diagnosis not present

## 2021-08-07 DIAGNOSIS — Z7982 Long term (current) use of aspirin: Secondary | ICD-10-CM | POA: Insufficient documentation

## 2021-08-07 DIAGNOSIS — Z510 Encounter for antineoplastic radiation therapy: Secondary | ICD-10-CM | POA: Diagnosis not present

## 2021-08-07 DIAGNOSIS — J90 Pleural effusion, not elsewhere classified: Secondary | ICD-10-CM | POA: Diagnosis not present

## 2021-08-07 DIAGNOSIS — J9611 Chronic respiratory failure with hypoxia: Secondary | ICD-10-CM

## 2021-08-07 DIAGNOSIS — R0602 Shortness of breath: Secondary | ICD-10-CM | POA: Diagnosis not present

## 2021-08-07 DIAGNOSIS — J9811 Atelectasis: Secondary | ICD-10-CM

## 2021-08-07 DIAGNOSIS — I480 Paroxysmal atrial fibrillation: Secondary | ICD-10-CM | POA: Diagnosis not present

## 2021-08-07 DIAGNOSIS — Z87891 Personal history of nicotine dependence: Secondary | ICD-10-CM | POA: Diagnosis not present

## 2021-08-07 LAB — CBC WITH DIFFERENTIAL/PLATELET
Abs Immature Granulocytes: 0.11 10*3/uL — ABNORMAL HIGH (ref 0.00–0.07)
Basophils Absolute: 0 10*3/uL (ref 0.0–0.1)
Basophils Relative: 0 %
Eosinophils Absolute: 0.1 10*3/uL (ref 0.0–0.5)
Eosinophils Relative: 1 %
HCT: 43.4 % (ref 36.0–46.0)
Hemoglobin: 12.6 g/dL (ref 12.0–15.0)
Immature Granulocytes: 1 %
Lymphocytes Relative: 8 %
Lymphs Abs: 1 10*3/uL (ref 0.7–4.0)
MCH: 26.5 pg (ref 26.0–34.0)
MCHC: 29 g/dL — ABNORMAL LOW (ref 30.0–36.0)
MCV: 91.4 fL (ref 80.0–100.0)
Monocytes Absolute: 1.6 10*3/uL — ABNORMAL HIGH (ref 0.1–1.0)
Monocytes Relative: 12 %
Neutro Abs: 10.7 10*3/uL — ABNORMAL HIGH (ref 1.7–7.7)
Neutrophils Relative %: 78 %
Platelets: 258 10*3/uL (ref 150–400)
RBC: 4.75 MIL/uL (ref 3.87–5.11)
RDW: 15.9 % — ABNORMAL HIGH (ref 11.5–15.5)
WBC: 13.6 10*3/uL — ABNORMAL HIGH (ref 4.0–10.5)
nRBC: 0 % (ref 0.0–0.2)

## 2021-08-07 LAB — RAD ONC ARIA SESSION SUMMARY
Course Elapsed Days: 5
Plan Fractions Treated to Date: 4
Plan Prescribed Dose Per Fraction: 2 Gy
Plan Total Fractions Prescribed: 30
Plan Total Prescribed Dose: 60 Gy
Reference Point Dosage Given to Date: 8 Gy
Reference Point Session Dosage Given: 2 Gy
Session Number: 4

## 2021-08-07 LAB — COMPREHENSIVE METABOLIC PANEL
ALT: 47 U/L — ABNORMAL HIGH (ref 0–44)
AST: 32 U/L (ref 15–41)
Albumin: 2.8 g/dL — ABNORMAL LOW (ref 3.5–5.0)
Alkaline Phosphatase: 71 U/L (ref 38–126)
Anion gap: 11 (ref 5–15)
BUN: 13 mg/dL (ref 8–23)
CO2: 38 mmol/L — ABNORMAL HIGH (ref 22–32)
Calcium: 8.9 mg/dL (ref 8.9–10.3)
Chloride: 92 mmol/L — ABNORMAL LOW (ref 98–111)
Creatinine, Ser: 0.92 mg/dL (ref 0.44–1.00)
GFR, Estimated: >60 mL/min (ref >60)
Glucose, Bld: 161 mg/dL — ABNORMAL HIGH (ref 70–99)
Potassium: 3.4 mmol/L — ABNORMAL LOW (ref 3.5–5.1)
Sodium: 141 mmol/L (ref 135–145)
Total Bilirubin: 0.6 mg/dL (ref 0.3–1.2)
Total Protein: 7.2 g/dL (ref 6.5–8.1)

## 2021-08-07 LAB — TSH: TSH: 2.761 u[IU]/mL (ref 0.350–4.500)

## 2021-08-07 LAB — T4, FREE: Free T4: 1.15 ng/dL — ABNORMAL HIGH (ref 0.61–1.12)

## 2021-08-07 MED ORDER — IOHEXOL 300 MG/ML  SOLN
75.0000 mL | Freq: Once | INTRAMUSCULAR | Status: AC | PRN
  Administered 2021-08-07: 75 mL via INTRAVENOUS

## 2021-08-07 MED ORDER — SODIUM CHLORIDE 0.9 % IV BOLUS
1000.0000 mL | Freq: Once | INTRAVENOUS | Status: AC
  Administered 2021-08-07: 1000 mL via INTRAVENOUS

## 2021-08-07 MED ORDER — METOPROLOL TARTRATE 5 MG/5ML IV SOLN
10.0000 mg | Freq: Once | INTRAVENOUS | Status: AC
Start: 2021-08-07 — End: 2021-08-07
  Administered 2021-08-07: 10 mg via INTRAVENOUS
  Filled 2021-08-07: qty 10

## 2021-08-07 MED ORDER — SONAFINE EX EMUL
1.0000 | Freq: Once | CUTANEOUS | Status: AC
  Administered 2021-08-07: 1 via TOPICAL

## 2021-08-07 NOTE — Progress Notes (Signed)
Patient in after receiving 4 of 30 radiation treatments to left lung. Patient was hypotensive, with elevated heart rate and temp.  BP 93/63, HR 169 02 sat 97% on 2 liters, temp 100.1. Patient denies any chest pain or acute shortness of breath. Patient did complain of back pain and visual changes. Patient sent to er for evaluation.

## 2021-08-07 NOTE — Discharge Instructions (Signed)
Take your medications as prescribed.  Follow-up with the pulmonologist in 1 month for further assessment of your abnormal chest x-ray and CT scan of your chest.  Follow-up with your doctor as scheduled for radiation.  Return to the ED with difficulty breathing, chest pain, palpitations, any other concerns

## 2021-08-07 NOTE — Progress Notes (Signed)
Pt here for patient teaching.    Pt given Radiation and You booklet, skin care instructions, and Sonafine.    Reviewed areas of pertinence such as fatigue, hair loss in treatment field, skin changes, throat changes, cough, shortness of breath, and taste changes .   Pt able to give teach back of to pat skin, use unscented/gentle soap, and drink plenty of water,apply Sonafine bid and avoid applying anything to skin within 4 hours of treatment.   Pt verbalizes understanding of information given and will contact nursing with any questions or concerns.

## 2021-08-07 NOTE — Telephone Encounter (Signed)
TCT patient's daughter, Sheral Flow, to advise of port placement apt in IR on 08/10/21 @ 11:30 AM. NPO after 7 AM, will need driver and supervision for 24 hours. Also advised of updated apt for RXT on the 23rd to 9:15 AM. Daughter has access to Mom's my chart and can access updated appointments.

## 2021-08-07 NOTE — ED Provider Notes (Signed)
[facility] DEPT Provider Note   CSN: [id number] Arrival date & time: 08/07/21  1348     History  Chief Complaint  Patient presents with   Tachycardia    [name] is a 76 y.o. female.  76 yo F with a cc of SVT.  Noted by nursing at her rad onc visit today.  Was well yesterday per family.  Borderline febrile in the oncology clinic but no noted fevers at home.  Chronic cough she thinks is better than normal.  Having some leg swelling off and on.  Recently hospitalized and had blood pulled off from her lung.          Home Medications Prior to Admission medications   Medication Sig Start Date End Date Taking? Authorizing Provider  albuterol (PROAIR HFA) 108 (90 Base) MCG/ACT inhaler 2 puffs up to every 4 hours as needed only  if your can't catch your breath Patient taking differently: Inhale 2 puffs into the lungs every 4 (four) hours as needed for shortness of breath. 03/27/21   Tanda Rockers, MD  albuterol (PROVENTIL) (2.5 MG/3ML) 0.083% nebulizer solution Take 2.5 mg by nebulization 3 (three) times daily as needed for wheezing or shortness of breath.    [provider]  amoxicillin-clavulanate (AUGMENTIN) 875-125 MG tablet Take 1 tablet by mouth 2 (two) times daily. 10 day course. On day 6 as of 07/25/21 Patient not taking: Reported on 08/06/2021    [provider]  aspirin 81 MG tablet Take 81 mg by mouth daily.    [provider]  Coenzyme Q10 (CO Q 10 PO) Take 1 capsule by mouth daily.    [provider]  dextromethorphan-guaiFENesin (MUCINEX DM) 30-600 MG 12hr tablet Take 1 tablet by mouth as needed for cough. Patient not taking: Reported on 08/06/2021    [provider]  fluconazole (DIFLUCAN) 100 MG tablet Take 1 tablet (100 mg total) by mouth daily. 08/06/21   Heilingoetter, Cassandra L, PA-C  fluticasone (FLONASE) 50 MCG/ACT nasal spray Place 2 sprays into both nostrils as needed. 02/26/21    [name], NP  furosemide (LASIX) 20 MG tablet Take 1 tablet (20 mg total) by mouth daily. 04/09/21   Lorrene Reid, PA-C  ibuprofen (ADVIL) 200 MG tablet Take 200 mg by mouth daily as needed for mild pain. Patient not taking: Reported on 08/06/2021    [provider]  levothyroxine (SYNTHROID) 25 MCG tablet TAKE 0.5 TABLETS (12.5 MCG TOTAL) BY MOUTH DAILY. IN ADDITION TO THE 50 MCG. Patient taking differently: Take 12.5 mcg by mouth daily. 11/17/19   Troy Sine, MD  levothyroxine (SYNTHROID) 50 MCG tablet TAKE 1 TABLET BY MOUTH EVERY DAY BEFORE BREAKFAST Patient taking differently: Take 50 mcg by mouth daily. 04/09/21   Troy Sine, MD  lidocaine-prilocaine (EMLA) cream Apply 1 Application topically as needed. 08/06/21   Heilingoetter, Cassandra L, PA-C  metoprolol succinate (TOPROL-XL) 100 MG 24 hr tablet Take 1 tablet (100 mg total) by mouth daily. Take with or immediately following a meal. Patient taking differently: Take 100 mg by mouth daily. 12/20/20   [name], NP  naproxen sodium (ALEVE) 220 MG tablet Take 220 mg by mouth daily as needed (pain).    [provider]  Omega-3 Fatty Acids (OMEGA-3 PO) Take 1 capsule by mouth daily.    [provider]  OXYGEN Inhale 2 L into the lungs as needed.    [provider]  predniSONE (DELTASONE)  20 MG tablet Take 60 mg by mouth daily. 5 day course. Last day 07/25/21. Patient not taking: Reported on 08/06/2021    [provider]  prochlorperazine (COMPAZINE) 10 MG tablet Take 1 tablet (10 mg total) by mouth every 6 (six) hours as needed. 08/06/21   Heilingoetter, Cassandra L, PA-C  rosuvastatin (CRESTOR) 40 MG tablet Take 1 tablet (40 mg total) by mouth daily. 11/29/20   Troy Sine, MD      Allergies    Codeine    Review of Systems   Review of Systems  Physical Exam Updated Vital Signs BP 139/90   Pulse 94   Temp 99.7 F (37.6 C) (Oral)   Resp (!) 25   SpO2 96%  Physical  Exam Vitals and nursing note reviewed.  Constitutional:      General: She is not in acute distress.    Appearance: She is well-developed. She is not diaphoretic.  HENT:     Head: Normocephalic and atraumatic.  Eyes:     Pupils: Pupils are equal, round, and reactive to light.  Cardiovascular:     Rate and Rhythm: Regular rhythm. Tachycardia present.     Heart sounds: No murmur heard.    No friction rub. No gallop.  Pulmonary:     Effort: Pulmonary effort is normal.     Breath sounds: No wheezing or rales.  Abdominal:     General: There is no distension.     Palpations: Abdomen is soft.     Tenderness: There is no abdominal tenderness.  Musculoskeletal:        General: No tenderness.     Cervical back: Normal range of motion and neck supple.  Skin:    General: Skin is warm and dry.  Neurological:     Mental Status: She is alert and oriented to person, place, and time.  Psychiatric:        Behavior: Behavior normal.     ED Results / Procedures / Treatments   Labs (all labs ordered are listed, but only abnormal results are displayed) Labs Reviewed  CBC WITH DIFFERENTIAL/PLATELET - Abnormal; Notable for the following components:      Result Value   WBC 13.6 (*)    MCHC 29.0 (*)    RDW 15.9 (*)    Neutro Abs 10.7 (*)    Monocytes Absolute 1.6 (*)    Abs Immature Granulocytes 0.11 (*)    All other components within normal limits  COMPREHENSIVE METABOLIC PANEL - Abnormal; Notable for the following components:   Potassium 3.4 (*)    Chloride 92 (*)    CO2 38 (*)    Glucose, Bld 161 (*)    Albumin 2.8 (*)    ALT 47 (*)    All other components within normal limits  TSH  T4, FREE    EKG None  Radiology DG Chest Port 1 View  Result Date: 08/07/2021 CLINICAL DATA:  Shortness of breath EXAM: PORTABLE CHEST 1 VIEW COMPARISON:  Chest radiograph dated July 26, 2021 FINDINGS: Evaluation of cardiomediastinal silhouette is limited due to complete opacification of the left  hemithorax. Right lung is clear. Sternotomy wires representing prior coronary artery bypass grafting unchanged. IMPRESSION: Complete opacification of the left hemithorax, which may represent atelectasis or effusion. Right lung is clear. Electronically Signed   By: Keane Police D.O.   On: 08/07/2021 14:22    Procedures .Cardioversion  Date/Time: 08/07/2021 2:38 PM  Performed by: Deno Etienne, DO Authorized by: Deno Etienne, DO  Consent:    Consent obtained:  Verbal   Consent given by:  Patient   Risks discussed:  Induced arrhythmia and pain   Alternatives discussed:  No treatment, delayed treatment and rate-control medication Pre-procedure details:    Cardioversion basis:  Emergent Patient sedated: No Post-procedure details:    Patient status:  Alert   Patient tolerance of procedure:  Tolerated well, no immediate complications Comments:     Patient converted to normal sinus rhythm from SVT after a bolus dose of metoprolol.      Medications Ordered in ED Medications  metoprolol tartrate (LOPRESSOR) injection 10 mg (10 mg Intravenous Given 08/07/21 1400)  sodium chloride 0.9 % bolus 1,000 mL (0 mLs Intravenous Stopped 08/07/21 1503)    ED Course/ Medical Decision Making/ A&P                           Medical Decision Making Amount and/or Complexity of Data Reviewed Labs: ordered. Radiology: ordered.  Risk Prescription drug management.   76 yo F with a cc of palpitations and sob.  Seen by rad onc this morning found to be in SVT and sent here.  HR in the 170's.  Given a dose of metop with mild improvement.  CXR, blood work, fluids, reassess.   Patient given a bolus dose of metoprolol with resolution of SVT.  Mild leukocytosis, chronic metabolic alkalosis chest x-ray independently turbid by me with complete whiteout of the left lung.  Her record was reviewed and it sounds like she has had known issues with that left lung with concern for infiltration of one of the left bronchioles  which is caused complete collapse at times.  She does not seem exceptionally more short of breath than normal.  Continues to feel better after improvement of her heart rate.  I discussed the x-ray findings with the critical care team.  Plan to evaluate the patient at bedside.  CRITICAL CARE Performed by: Cecilio Asper   Total critical care time: 35 minutes  Critical care time was exclusive of separately billable procedures and treating other patients.  Critical care was necessary to treat or prevent imminent or life-threatening deterioration.  Critical care was time spent personally by me on the following activities: development of treatment plan with patient and/or surrogate as well as nursing, discussions with consultants, evaluation of patient's response to treatment, examination of patient, obtaining history from patient or surrogate, ordering and performing treatments and interventions, ordering and review of laboratory studies, ordering and review of radiographic studies, pulse oximetry and re-evaluation of patient's condition.  The patients results and plan were reviewed and discussed.   Any x-rays performed were independently reviewed by myself.   Differential diagnosis were considered with the presenting HPI.  Medications  metoprolol tartrate (LOPRESSOR) injection 10 mg (10 mg Intravenous Given 08/07/21 1400)  sodium chloride 0.9 % bolus 1,000 mL (0 mLs Intravenous Stopped 08/07/21 1503)    Vitals:   08/07/21 1421 08/07/21 1445 08/07/21 1515 08/07/21 1525  BP:  126/68 118/61 139/90  Pulse:  87 94 94  Resp:  20 20 (!) 25  Temp: 99.7 F (37.6 C)     TempSrc: Oral     SpO2:  98% 99% 96%    Final diagnoses:  SVT (supraventricular tachycardia) (HCC)  SOB (shortness of breath)    Admission/ observation were discussed with the admitting physician, patient and/or family and they are comfortable with the plan.  Final Clinical Impression(s) / ED  Diagnoses Final diagnoses:  SVT (supraventricular tachycardia) (HCC)  SOB (shortness of breath)    Rx / DC Orders ED Discharge Orders     None         Deno Etienne, DO 08/07/21 1616

## 2021-08-07 NOTE — Consult Note (Signed)
NAME:  [name], MRN:  [id number], DOB:  1945/09/05, LOS: 0 ADMISSION DATE:  08/07/2021, CONSULTATION DATE:  6/20 REFERRING MD:  Dr. Tyrone Zimmerman EDP, CHIEF COMPLAINT:  Pleural effusion   History of Present Illness:  76 year old female with PMH as below, which is significant for CAD, HTN, and hypothyroid. Also has a history of lung cancer. Diagnosed with adenocarcinoma in 2012 and treated with left upper lobectomy. In 2022 she had an enlarging LLL nodule hypermetabolic on PET, which was treated with stereotatic radiation. Follow up evaluation this year concerning for a hypermetabolic large left hilar mass. She underwent EBUS biopsy by Dr. Roxan Zimmerman on 6/9, which was positive for non-small cell lung cancer. The following day she was admitted to [facility] for leg swelling and shortness of breath. She was found to have large left sided pleural effusion. Chest tube was placed, but effusion did not resolve despite drainage and diuresis. She was started on steroids, the chest tube was removed, and she was discharged with plan to see radiation oncology that same day. Radiation was started 6/15 and was repeated daily (with the exception of 6/17). Also planning to start chemotherapy in the near future. When she presented for radiation on 6/20 she was noted to be tachycardic with rates as high as the 160's and was referred to [facility] ED. Also c/o SOB. Felt to be in SVT by EDP. Resolved with metoprolol IV.  CXR with complete opacification of the L hemithorax and PCCM was asked to evaluate.   Pertinent  Medical History   has a past medical history of CAD (coronary artery disease), Cancer ([place]), COPD (chronic obstructive pulmonary disease) ([place]), Dyslipidemia, GERD (gastroesophageal reflux disease), History of radiation therapy, HTN (hypertension), Hypothyroidism, Myocardial infarct ([place]), Non-small cell lung cancer ([place]) (08/19/2010), and Tobacco abuse.   Significant Hospital Events: Including procedures,  antibiotic start and stop dates in addition to other pertinent events     Interim History / Subjective:  6/20 admit   Objective   Blood pressure 139/90, pulse 94, temperature 99.7 F (37.6 C), temperature source Oral, resp. rate (!) 25, SpO2 96 %.       No intake or output data in the 24 hours ending 08/07/21 1645 There were no vitals filed for this visit.  Examination: General: Overweight elderly appearing female in NAD  HENT: [place]/AT, PERRL, no JVD Lungs: Diminished on the L. No distress on 4 L [place].  Cardiovascular: RRR, no MRG. No edema.  Abdomen: Soft, non-tender, non-distended.  Extremities: No acute deformity or ROM limitation.  Neuro: Alert, oriented, non-focal  Resolved Hospital Problem list     Assessment & Plan:   Complete opacification of the left hemithorax. Following EBUS 6/9 for biopsy of hilar mass now known as non-small cell lung cancer. Recently admitted to [facility] for L hemithorax opacification as well. Felt to represent effusion, chest tube was placed, but the effusion did not clear despite drainage. Thought to be trapped lung. She was started on steroids and referred to [facility]. She feels about the same as she did upon discharge from [place] on 6/15  Plan: - Dr. Carlis Zimmerman d/w Dr. Roxan Zimmerman who did her biopsy 6/9. - Plan is for [name] to be admitted. - CT chest to better characterize the left hemithorax. - If there is truly effusion we will plan to place chest tube for drainage. - If this is collapse will need further evaluation by radiation oncology.  Dyspnea Hypoxia: she is breathing at her recent baseline.  Unchanged from the time of discharge a few days ago. On home O2 which has been turned up to 4lpm of late.  - Continue supplemental O2 to keep sats > 90%  SVT: resolved in ED with metoprolol. History of AF only during a remote admission in [place].  - management per primary - Followed by [facility] cardiology Dr. Claiborne Zimmerman, consider consultation.    Best  Practice (right click and "Reselect all SmartList Selections" daily)   Per primary  Labs   CBC: Recent Labs  Lab 08/06/21 0744 08/07/21 1358  WBC 14.4* 13.6*  NEUTROABS 11.0* 10.7*  HGB 11.2* 12.6  HCT 37.4 43.4  MCV 88.6 91.4  PLT 213 124    Basic Metabolic Panel: Recent Labs  Lab 08/06/21 0744 08/07/21 1358  NA 139 141  K 3.7 3.4*  CL 92* 92*  CO2 43* 38*  GLUCOSE 123* 161*  BUN 13 13  CREATININE 0.80 0.92  CALCIUM 9.3 8.9   GFR: Estimated Creatinine Clearance: 58.7 mL/min (by C-G formula based on SCr of 0.92 mg/dL). Recent Labs  Lab 08/06/21 0744 08/07/21 1358  WBC 14.4* 13.6*    Liver Function Tests: Recent Labs  Lab 08/06/21 0744 08/07/21 1358  AST 19 32  ALT 32 47*  ALKPHOS 71 71  BILITOT 0.4 0.6  PROT 6.3* 7.2  ALBUMIN 3.0* 2.8*   No results for input(s): "LIPASE", "AMYLASE" in the last 168 hours. No results for input(s): "AMMONIA" in the last 168 hours.  ABG    Component Value Date/Time   PHART 7.401 (H) 09/18/2010 0450   PCO2ART 40.0 09/18/2010 0450   PO2ART 71.4 (L) 09/18/2010 0450   HCO3 24.3 (H) 09/18/2010 0450   TCO2 25.5 09/18/2010 0450   ACIDBASEDEF 1.0 07/07/2010 1023   O2SAT 94.2 09/18/2010 0450     Coagulation Profile: No results for input(s): "INR", "PROTIME" in the last 168 hours.  Cardiac Enzymes: No results for input(s): "CKTOTAL", "CKMB", "CKMBINDEX", "TROPONINI" in the last 168 hours.  HbA1C: Hgb A1c MFr Bld  Date/Time Value Ref Range Status  10/25/2020 09:35 AM 6.2 (H) 4.8 - 5.6 % Final    Comment:             Prediabetes: 5.7 - 6.4          Diabetes: >6.4          Glycemic control for adults with diabetes: <7.0   06/14/2019 10:52 AM 6.1 (H) 4.8 - 5.6 % Final    Comment:             Prediabetes: 5.7 - 6.4          Diabetes: >6.4          Glycemic control for adults with diabetes: <7.0     CBG: No results for input(s): "GLUCAP" in the last 168 hours.  Review of Systems:   Bolds are positive   Constitutional: weight loss, gain, night sweats, Fevers, chills, fatigue .  HEENT: headaches, Sore throat, sneezing, nasal congestion, post nasal drip, Difficulty swallowing, Tooth/dental problems, visual complaints visual changes, ear ache CV:  chest pain, radiates:,Orthopnea, PND, swelling in lower extremities, dizziness, palpitations, syncope.  GI  heartburn, indigestion, abdominal pain, nausea, vomiting, diarrhea, change in bowel habits, loss of appetite, bloody stools.  Resp: cough no more than normal, productive: , hemoptysis, dyspnea no more than normal, chest pain, pleuritic.  Skin: rash or itching or icterus GU: dysuria, change in color of urine, urgency or frequency. flank pain, hematuria  MS: joint pain  or swelling. decreased range of motion  Psych: change in mood or affect. depression or anxiety.  Neuro: difficulty with speech, weakness, numbness, ataxia    Past Medical History:  She,  has a past medical history of CAD (coronary artery disease), Cancer ([place]), COPD (chronic obstructive pulmonary disease) ([place]), Dyslipidemia, GERD (gastroesophageal reflux disease), History of radiation therapy, HTN (hypertension), Hypothyroidism, Myocardial infarct ([place]), Non-small cell lung cancer ([place]) (08/19/2010), and Tobacco abuse.   Surgical History:   Past Surgical History:  Procedure Laterality Date   CARDIAC CATHETERIZATION  07/06/2010   see CABG report - pt sent to OR   CARDIOVASCULAR STRESS TEST  09/11/2010   R/S MV - normal perfusion in all regions, EF 46%, no scintigraphic evidence of inducible myocardial ischemia; global LV systolic function mildly reduced; no significant wall motion abnormalities noted; Exercise capacity 7 METS; EKG negative for ischemia; low risk study, no signifcant change from previous study 08/2003   CORONARY ARTERY BYPASS GRAFT  07/11/2010   LIMA to LAD; SVG to 2nd branch OM; SVG to posterior descending artery   LEFT VATS  09/17/2010   TEE WITHOUT CARDIOVERSION   07/06/2010   during emergent CABG surgery; 2-3+ mitral regurgitation   VIDEO BRONCHOSCOPY N/A 07/27/2021   Procedure: VIDEO BRONCHOSCOPY;  Surgeon: Melrose Nakayama, MD;  Location: [facility];  Service: Thoracic;  Laterality: N/A;   VIDEO BRONCHOSCOPY WITH ENDOBRONCHIAL ULTRASOUND N/A 07/27/2021   Procedure: VIDEO BRONCHOSCOPY WITH ENDOBRONCHIAL ULTRASOUND;  Surgeon: Melrose Nakayama, MD;  Location: [facility] OR;  Service: Thoracic;  Laterality: N/A;     Social History:   reports that she has been smoking cigarettes. She has a 58.00 pack-year smoking history. She has never used smokeless tobacco. She reports that she does not drink alcohol and does not use drugs.   Family History:  Her family history includes Aplastic anemia in her maternal grandfather; Heart attack in her father; Hypothyroidism in her father; Stroke in her paternal grandfather.   Allergies Allergies  Allergen Reactions   Codeine Other (See Comments)    Unknown reaction Patient avoids this medication     Home Medications  Prior to Admission medications   Medication Sig Start Date End Date Taking? Authorizing Provider  albuterol (PROAIR HFA) 108 (90 Base) MCG/ACT inhaler 2 puffs up to every 4 hours as needed only  if your can't catch your breath Patient taking differently: Inhale 2 puffs into the lungs every 4 (four) hours as needed for shortness of breath. 03/27/21   Tanda Rockers, MD  albuterol (PROVENTIL) (2.5 MG/3ML) 0.083% nebulizer solution Take 2.5 mg by nebulization 3 (three) times daily as needed for wheezing or shortness of breath.    [provider]  amoxicillin-clavulanate (AUGMENTIN) 875-125 MG tablet Take 1 tablet by mouth 2 (two) times daily. 10 day course. On day 6 as of 07/25/21 Patient not taking: Reported on 08/06/2021    [provider]  aspirin 81 MG tablet Take 81 mg by mouth daily.    [provider]  Coenzyme Q10 (CO Q 10 PO) Take 1 capsule by mouth daily.    [provider]  dextromethorphan-guaiFENesin (MUCINEX DM) 30-600 MG 12hr tablet Take 1 tablet by mouth as needed for cough. Patient not taking: Reported on 08/06/2021    [provider]  fluconazole (DIFLUCAN) 100 MG tablet Take 1 tablet (100 mg total) by mouth daily. 08/06/21   Heilingoetter, Cassandra L, PA-C  fluticasone (FLONASE) 50 MCG/ACT nasal spray Place 2 sprays into both nostrils  as needed. 02/26/21   [name], NP  furosemide (LASIX) 20 MG tablet Take 1 tablet (20 mg total) by mouth daily. 04/09/21   Lorrene Reid, PA-C  ibuprofen (ADVIL) 200 MG tablet Take 200 mg by mouth daily as needed for mild pain. Patient not taking: Reported on 08/06/2021    [provider]  levothyroxine (SYNTHROID) 25 MCG tablet TAKE 0.5 TABLETS (12.5 MCG TOTAL) BY MOUTH DAILY. IN ADDITION TO THE 50 MCG. Patient taking differently: Take 12.5 mcg by mouth daily. 11/17/19   Troy Sine, MD  levothyroxine (SYNTHROID) 50 MCG tablet TAKE 1 TABLET BY MOUTH EVERY DAY BEFORE BREAKFAST Patient taking differently: Take 50 mcg by mouth daily. 04/09/21   Troy Sine, MD  lidocaine-prilocaine (EMLA) cream Apply 1 Application topically as needed. 08/06/21   Heilingoetter, Cassandra L, PA-C  metoprolol succinate (TOPROL-XL) 100 MG 24 hr tablet Take 1 tablet (100 mg total) by mouth daily. Take with or immediately following a meal. Patient taking differently: Take 100 mg by mouth daily. 12/20/20   [name], NP  naproxen sodium (ALEVE) 220 MG tablet Take 220 mg by mouth daily as needed (pain).    [provider]  Omega-3 Fatty Acids (OMEGA-3 PO) Take 1 capsule by mouth daily.    [provider]  OXYGEN Inhale 2 L into the lungs as needed.    [provider]  predniSONE (DELTASONE) 20 MG tablet Take 60 mg by mouth daily. 5 day course. Last day 07/25/21. Patient not taking: Reported on 08/06/2021    [provider]  prochlorperazine (COMPAZINE) 10 MG tablet Take  1 tablet (10 mg total) by mouth every 6 (six) hours as needed. 08/06/21   Heilingoetter, Cassandra L, PA-C  rosuvastatin (CRESTOR) 40 MG tablet Take 1 tablet (40 mg total) by mouth daily. 11/29/20   Troy Sine, MD      Georgann Housekeeper, AGACNP-BC [facility]  See Amion for personal pager PCCM on call pager [phone] until 7pm. Please call Elink 7p-7a. [phone]  08/07/2021 5:29 PM

## 2021-08-07 NOTE — Telephone Encounter (Signed)
Pulmonology follow up requested in 1 month after radiation to evaluate for persistent opacification of L hemithorax.  Julian Hy, DO 08/07/21 5:57 PM [facility]

## 2021-08-07 NOTE — ED Provider Notes (Signed)
Patient has been seen by Dr. Carlis Abbott of critical care.  She recommended CT scan which shows complete collapse of left lung with atelectasis and likely obstructive lesion.  CXR findings are stable to recent outpatient imaging  Dr. Carlis Abbott discussed the patient's care with Dr. Roxan Hockey as well as Dr. Lamonte Sakai who knows the patient.  They do not recommend chest tube at this time Plan is to follow-up in 1 month for repeat CT scan and possibly IR placement of chest tube.  No need for hospitalization at this time per pulmonology  Discussed with family who is in agreement and anxious to go home.   Ezequiel Essex, MD 08/08/21 [phone]

## 2021-08-08 ENCOUNTER — Inpatient Hospital Stay: Payer: Medicare HMO

## 2021-08-08 ENCOUNTER — Other Ambulatory Visit: Payer: Self-pay

## 2021-08-08 ENCOUNTER — Ambulatory Visit
Admission: RE | Admit: 2021-08-08 | Discharge: 2021-08-08 | Disposition: A | Discharge status: Home / Self Care | Payer: Medicare HMO | Source: Ambulatory Visit | Attending: Radiation Oncology | Admitting: Radiation Oncology

## 2021-08-08 ENCOUNTER — Other Ambulatory Visit: Payer: Self-pay | Admitting: Physician Assistant

## 2021-08-08 ENCOUNTER — Inpatient Hospital Stay (HOSPITAL_COMMUNITY)
Admission: EM | Admit: 2021-08-08 | Discharge: 2021-08-11 | DRG: 308 | Disposition: A | Discharge status: Home / Self Care | Payer: Medicare HMO | Source: Ambulatory Visit | Attending: Family Medicine | Admitting: Family Medicine

## 2021-08-08 ENCOUNTER — Encounter (HOSPITAL_COMMUNITY): Payer: Self-pay

## 2021-08-08 ENCOUNTER — Encounter: Payer: Self-pay | Admitting: Internal Medicine

## 2021-08-08 DIAGNOSIS — I471 Supraventricular tachycardia: Secondary | ICD-10-CM | POA: Diagnosis present

## 2021-08-08 DIAGNOSIS — Z9861 Coronary angioplasty status: Secondary | ICD-10-CM

## 2021-08-08 DIAGNOSIS — I480 Paroxysmal atrial fibrillation: Principal | ICD-10-CM | POA: Diagnosis present

## 2021-08-08 DIAGNOSIS — J9811 Atelectasis: Secondary | ICD-10-CM | POA: Diagnosis present

## 2021-08-08 DIAGNOSIS — J9612 Chronic respiratory failure with hypercapnia: Secondary | ICD-10-CM | POA: Diagnosis present

## 2021-08-08 DIAGNOSIS — I252 Old myocardial infarction: Secondary | ICD-10-CM

## 2021-08-08 DIAGNOSIS — E876 Hypokalemia: Secondary | ICD-10-CM | POA: Diagnosis not present

## 2021-08-08 DIAGNOSIS — Z885 Allergy status to narcotic agent status: Secondary | ICD-10-CM

## 2021-08-08 DIAGNOSIS — Z7984 Long term (current) use of oral hypoglycemic drugs: Secondary | ICD-10-CM

## 2021-08-08 DIAGNOSIS — C3432 Malignant neoplasm of lower lobe, left bronchus or lung: Secondary | ICD-10-CM | POA: Diagnosis present

## 2021-08-08 DIAGNOSIS — J449 Chronic obstructive pulmonary disease, unspecified: Secondary | ICD-10-CM | POA: Diagnosis present

## 2021-08-08 DIAGNOSIS — I509 Heart failure, unspecified: Secondary | ICD-10-CM

## 2021-08-08 DIAGNOSIS — Z85118 Personal history of other malignant neoplasm of bronchus and lung: Secondary | ICD-10-CM

## 2021-08-08 DIAGNOSIS — I4892 Unspecified atrial flutter: Secondary | ICD-10-CM | POA: Diagnosis not present

## 2021-08-08 DIAGNOSIS — Z743 Need for continuous supervision: Secondary | ICD-10-CM | POA: Diagnosis not present

## 2021-08-08 DIAGNOSIS — Z951 Presence of aortocoronary bypass graft: Secondary | ICD-10-CM

## 2021-08-08 DIAGNOSIS — Z87891 Personal history of nicotine dependence: Secondary | ICD-10-CM | POA: Diagnosis not present

## 2021-08-08 DIAGNOSIS — E039 Hypothyroidism, unspecified: Secondary | ICD-10-CM | POA: Diagnosis present

## 2021-08-08 DIAGNOSIS — R0789 Other chest pain: Secondary | ICD-10-CM | POA: Diagnosis not present

## 2021-08-08 DIAGNOSIS — Z823 Family history of stroke: Secondary | ICD-10-CM

## 2021-08-08 DIAGNOSIS — Z510 Encounter for antineoplastic radiation therapy: Secondary | ICD-10-CM | POA: Diagnosis not present

## 2021-08-08 DIAGNOSIS — C3492 Malignant neoplasm of unspecified part of left bronchus or lung: Secondary | ICD-10-CM

## 2021-08-08 DIAGNOSIS — Z8249 Family history of ischemic heart disease and other diseases of the circulatory system: Secondary | ICD-10-CM

## 2021-08-08 DIAGNOSIS — J9611 Chronic respiratory failure with hypoxia: Secondary | ICD-10-CM | POA: Diagnosis present

## 2021-08-08 DIAGNOSIS — Z79890 Hormone replacement therapy (postmenopausal): Secondary | ICD-10-CM

## 2021-08-08 DIAGNOSIS — Z7982 Long term (current) use of aspirin: Secondary | ICD-10-CM

## 2021-08-08 DIAGNOSIS — I10 Essential (primary) hypertension: Secondary | ICD-10-CM | POA: Diagnosis present

## 2021-08-08 DIAGNOSIS — E785 Hyperlipidemia, unspecified: Secondary | ICD-10-CM | POA: Diagnosis present

## 2021-08-08 DIAGNOSIS — C3412 Malignant neoplasm of upper lobe, left bronchus or lung: Secondary | ICD-10-CM | POA: Diagnosis not present

## 2021-08-08 DIAGNOSIS — F17210 Nicotine dependence, cigarettes, uncomplicated: Secondary | ICD-10-CM | POA: Diagnosis present

## 2021-08-08 DIAGNOSIS — I491 Atrial premature depolarization: Secondary | ICD-10-CM | POA: Diagnosis not present

## 2021-08-08 DIAGNOSIS — I5033 Acute on chronic diastolic (congestive) heart failure: Secondary | ICD-10-CM | POA: Diagnosis present

## 2021-08-08 DIAGNOSIS — N390 Urinary tract infection, site not specified: Secondary | ICD-10-CM | POA: Diagnosis present

## 2021-08-08 DIAGNOSIS — Z79899 Other long term (current) drug therapy: Secondary | ICD-10-CM

## 2021-08-08 DIAGNOSIS — I255 Ischemic cardiomyopathy: Secondary | ICD-10-CM | POA: Diagnosis present

## 2021-08-08 DIAGNOSIS — K219 Gastro-esophageal reflux disease without esophagitis: Secondary | ICD-10-CM | POA: Diagnosis present

## 2021-08-08 DIAGNOSIS — R079 Chest pain, unspecified: Secondary | ICD-10-CM | POA: Diagnosis not present

## 2021-08-08 DIAGNOSIS — Z923 Personal history of irradiation: Secondary | ICD-10-CM

## 2021-08-08 DIAGNOSIS — C3490 Malignant neoplasm of unspecified part of unspecified bronchus or lung: Secondary | ICD-10-CM | POA: Diagnosis present

## 2021-08-08 DIAGNOSIS — I2510 Atherosclerotic heart disease of native coronary artery without angina pectoris: Secondary | ICD-10-CM | POA: Diagnosis present

## 2021-08-08 DIAGNOSIS — I110 Hypertensive heart disease with heart failure: Secondary | ICD-10-CM | POA: Diagnosis present

## 2021-08-08 LAB — LACTIC ACID, PLASMA: Lactic Acid, Venous: 1 mmol/L (ref 0.5–1.9)

## 2021-08-08 LAB — CBC WITH DIFFERENTIAL/PLATELET
Abs Immature Granulocytes: 0.08 10*3/uL — ABNORMAL HIGH (ref 0.00–0.07)
Basophils Absolute: 0 10*3/uL (ref 0.0–0.1)
Basophils Relative: 0 %
Eosinophils Absolute: 0 10*3/uL (ref 0.0–0.5)
Eosinophils Relative: 0 %
HCT: 37.3 % (ref 36.0–46.0)
Hemoglobin: 10.8 g/dL — ABNORMAL LOW (ref 12.0–15.0)
Immature Granulocytes: 1 %
Lymphocytes Relative: 7 %
Lymphs Abs: 1 10*3/uL (ref 0.7–4.0)
MCH: 26 pg (ref 26.0–34.0)
MCHC: 29 g/dL — ABNORMAL LOW (ref 30.0–36.0)
MCV: 89.9 fL (ref 80.0–100.0)
Monocytes Absolute: 1.9 10*3/uL — ABNORMAL HIGH (ref 0.1–1.0)
Monocytes Relative: 14 %
Neutro Abs: 10.3 10*3/uL — ABNORMAL HIGH (ref 1.7–7.7)
Neutrophils Relative %: 78 %
Platelets: 281 10*3/uL (ref 150–400)
RBC: 4.15 MIL/uL (ref 3.87–5.11)
RDW: 15.8 % — ABNORMAL HIGH (ref 11.5–15.5)
WBC: 13.3 10*3/uL — ABNORMAL HIGH (ref 4.0–10.5)
nRBC: 0 % (ref 0.0–0.2)

## 2021-08-08 LAB — I-STAT VENOUS BLOOD GAS, ED
Acid-Base Excess: 13 mmol/L — ABNORMAL HIGH (ref 0.0–2.0)
Bicarbonate: 38.5 mmol/L — ABNORMAL HIGH (ref 20.0–28.0)
Calcium, Ion: 1.05 mmol/L — ABNORMAL LOW (ref 1.15–1.40)
HCT: 35 % — ABNORMAL LOW (ref 36.0–46.0)
Hemoglobin: 11.9 g/dL — ABNORMAL LOW (ref 12.0–15.0)
O2 Saturation: 99 %
Potassium: 3.3 mmol/L — ABNORMAL LOW (ref 3.5–5.1)
Sodium: 138 mmol/L (ref 135–145)
TCO2: 40 mmol/L — ABNORMAL HIGH (ref 22–32)
pCO2, Ven: 52.7 mmHg (ref 44–60)
pH, Ven: 7.471 — ABNORMAL HIGH (ref 7.25–7.43)
pO2, Ven: 150 mmHg — ABNORMAL HIGH (ref 32–45)

## 2021-08-08 LAB — COMPREHENSIVE METABOLIC PANEL
ALT: 54 U/L — ABNORMAL HIGH (ref 0–44)
AST: 43 U/L — ABNORMAL HIGH (ref 15–41)
Albumin: 2.1 g/dL — ABNORMAL LOW (ref 3.5–5.0)
Alkaline Phosphatase: 70 U/L (ref 38–126)
Anion gap: 11 (ref 5–15)
BUN: 8 mg/dL (ref 8–23)
CO2: 36 mmol/L — ABNORMAL HIGH (ref 22–32)
Calcium: 8.7 mg/dL — ABNORMAL LOW (ref 8.9–10.3)
Chloride: 94 mmol/L — ABNORMAL LOW (ref 98–111)
Creatinine, Ser: 0.79 mg/dL (ref 0.44–1.00)
GFR, Estimated: >60 mL/min (ref >60)
Glucose, Bld: 156 mg/dL — ABNORMAL HIGH (ref 70–99)
Potassium: 3.3 mmol/L — ABNORMAL LOW (ref 3.5–5.1)
Sodium: 141 mmol/L (ref 135–145)
Total Bilirubin: 0.4 mg/dL (ref 0.3–1.2)
Total Protein: 5.8 g/dL — ABNORMAL LOW (ref 6.5–8.1)

## 2021-08-08 LAB — RAD ONC ARIA SESSION SUMMARY
Course Elapsed Days: 6
Plan Fractions Treated to Date: 5
Plan Prescribed Dose Per Fraction: 2 Gy
Plan Total Fractions Prescribed: 30
Plan Total Prescribed Dose: 60 Gy
Reference Point Dosage Given to Date: 10 Gy
Reference Point Session Dosage Given: 2 Gy
Session Number: 5

## 2021-08-08 LAB — TSH: TSH: 2.1 u[IU]/mL (ref 0.350–4.500)

## 2021-08-08 LAB — BRAIN NATRIURETIC PEPTIDE: B Natriuretic Peptide: 360.1 pg/mL — ABNORMAL HIGH (ref 0.0–100.0)

## 2021-08-08 LAB — MAGNESIUM: Magnesium: 2.2 mg/dL (ref 1.7–2.4)

## 2021-08-08 LAB — CBG MONITORING, ED: Glucose-Capillary: 176 mg/dL — ABNORMAL HIGH (ref 70–99)

## 2021-08-08 LAB — TROPONIN I (HIGH SENSITIVITY): Troponin I (High Sensitivity): 40 ng/L — ABNORMAL HIGH (ref <18)

## 2021-08-08 MED ORDER — METOPROLOL TARTRATE 5 MG/5ML IV SOLN
INTRAVENOUS | Status: AC
  Filled 2021-08-08: qty 5

## 2021-08-08 MED ORDER — VANCOMYCIN HCL 1500 MG/300ML IV SOLN
1500.0000 mg | Freq: Once | INTRAVENOUS | Status: DC
  Filled 2021-08-08: qty 300

## 2021-08-08 MED ORDER — METOPROLOL TARTRATE 5 MG/5ML IV SOLN
5.0000 mg | Freq: Once | INTRAVENOUS | Status: AC
  Administered 2021-08-08: 5 mg via INTRAVENOUS

## 2021-08-08 MED ORDER — AMIODARONE LOAD VIA INFUSION
150.0000 mg | Freq: Once | INTRAVENOUS | Status: AC
  Administered 2021-08-08: 150 mg via INTRAVENOUS
  Filled 2021-08-08: qty 83.34

## 2021-08-08 MED ORDER — ADENOSINE 6 MG/2ML IV SOLN
INTRAVENOUS | Status: DC | PRN
  Administered 2021-08-08: 12 mg via INTRAVENOUS

## 2021-08-08 MED ORDER — METOPROLOL TARTRATE 5 MG/5ML IV SOLN: 5.0000 mg | Freq: Once | INTRAVENOUS | Status: AC

## 2021-08-08 MED ORDER — SODIUM CHLORIDE 0.9 % IV BOLUS
500.0000 mL | Freq: Once | INTRAVENOUS | Status: AC
  Administered 2021-08-08: 500 mL via INTRAVENOUS

## 2021-08-08 MED ORDER — SODIUM CHLORIDE 0.9 % IV SOLN: 2.0000 g | Freq: Once | INTRAVENOUS | Status: DC

## 2021-08-08 MED ORDER — AMIODARONE IV BOLUS ONLY 150 MG/100ML
INTRAVENOUS | Status: AC
  Filled 2021-08-08: qty 100

## 2021-08-08 MED ORDER — ADENOSINE 6 MG/2ML IV SOLN
6.0000 mg | Freq: Once | INTRAVENOUS | Status: AC
  Administered 2021-08-08: 6 mg via INTRAVENOUS
  Filled 2021-08-08: qty 2

## 2021-08-08 MED ORDER — AMIODARONE HCL IN DEXTROSE 360-4.14 MG/200ML-% IV SOLN
30.0000 mg/h | INTRAVENOUS | Status: DC
  Administered 2021-08-09: 30 mg/h via INTRAVENOUS
  Filled 2021-08-08: qty 200

## 2021-08-08 MED ORDER — AMIODARONE HCL IN DEXTROSE 360-4.14 MG/200ML-% IV SOLN
INTRAVENOUS | Status: AC
  Filled 2021-08-08: qty 200

## 2021-08-08 MED ORDER — VANCOMYCIN HCL IN DEXTROSE 1-5 GM/200ML-% IV SOLN: 1000.0000 mg | INTRAVENOUS | Status: DC

## 2021-08-08 MED ORDER — AMIODARONE HCL IN DEXTROSE 360-4.14 MG/200ML-% IV SOLN
60.0000 mg/h | INTRAVENOUS | Status: DC
  Administered 2021-08-08 – 2021-08-09 (×2): 60 mg/h via INTRAVENOUS
  Filled 2021-08-08: qty 200

## 2021-08-08 MED ORDER — METOPROLOL TARTRATE 5 MG/5ML IV SOLN
INTRAVENOUS | Status: AC
  Administered 2021-08-08: 5 mg via INTRAVENOUS
  Filled 2021-08-08: qty 5

## 2021-08-08 NOTE — ED Notes (Signed)
Pt placed on zoll

## 2021-08-08 NOTE — Consult Note (Signed)
Cardiology Consultation:   Patient ID: [name] MRN: [id number]; DOB: 01/12/1946  Admit date: 08/08/2021 Date of Consult: 08/09/2021  PCP:  [name], NP   [facility] Providers Cardiologist:  Shelva Majestic Click here to update MD or APP on Care Team, Refresh:1}     Patient Profile:   [name] is a 76 y.o. female with a hx of hypertension, hyperlipidemia, CABG in 2012 recent diagnosis of paroxysmal atrial fibrillation, on anticoagulation, tobacco abuse, COPD stage stage IV, on home oxygen, recent diagnosis of lung cancer non-small cell lung cancer stage 2b, currently undergoing radiation therapy, concern for the trapped lung, pulmonary emphysema and morbid obesity with recent presentation to the medical risks regular tachycardia 6/202023 who is being seen 08/09/2021 for the evaluation of A-fib/a flutter RVR at the request of emergency room.    History of Present Illness:   [name] very pleasant Caucasian female developed episode of palpitations very similar to the episode yesterday.  Patient is well documented since episodes of atrial flutter.Patient received multiple rounds of adenosine without termination of tachycardia.  Unfortunately strips are not available for me for review. Subsequently patient received IV metoprolol that helped with the rate control for some time however soon after patient went back to a target of 150 and 160.  At bedside patient is hemodynamically stable.  She is not in acute distress.  Patient is noted to be tachypneic with a probably about 18-20 times a minute.  Past Medical History:  Diagnosis Date   CAD (coronary artery disease)    Cancer ([place])    COPD (chronic obstructive pulmonary disease) ([place])    per DM note, pt denies   Dyslipidemia    GERD (gastroesophageal reflux disease)    History of radiation therapy    Left lung- 01/16/21-01/30/21- Dr. Gery Pray   HTN (hypertension)    Hypothyroidism    Myocardial infarct  [facility])    Non-small cell lung cancer ([place]) 08/19/2010   Stage IA, status post left upper lobectomy July 2012   Tobacco abuse     Past Surgical History:  Procedure Laterality Date   CARDIAC CATHETERIZATION  07/06/2010   see CABG report - pt sent to OR   CARDIOVASCULAR STRESS TEST  09/11/2010   R/S MV - normal perfusion in all regions, EF 46%, no scintigraphic evidence of inducible myocardial ischemia; global LV systolic function mildly reduced; no significant wall motion abnormalities noted; Exercise capacity 7 METS; EKG negative for ischemia; low risk study, no signifcant change from previous study 08/2003   CORONARY ARTERY BYPASS GRAFT  07/11/2010   LIMA to LAD; SVG to 2nd branch OM; SVG to posterior descending artery   LEFT VATS  09/17/2010   TEE WITHOUT CARDIOVERSION  07/06/2010   during emergent CABG surgery; 2-3+ mitral regurgitation   VIDEO BRONCHOSCOPY N/A 07/27/2021   Procedure: VIDEO BRONCHOSCOPY;  Surgeon: Melrose Nakayama, MD;  Location: [facility];  Service: Thoracic;  Laterality: N/A;   VIDEO BRONCHOSCOPY WITH ENDOBRONCHIAL ULTRASOUND N/A 07/27/2021   Procedure: VIDEO BRONCHOSCOPY WITH ENDOBRONCHIAL ULTRASOUND;  Surgeon: Melrose Nakayama, MD;  Location: [facility] OR;  Service: Thoracic;  Laterality: N/A;     Home Medications:  Prior to Admission medications   Medication Sig Start Date End Date Taking? Authorizing Provider  acetaminophen (TYLENOL) 500 MG tablet Take 500 mg by mouth every 6 (six) hours as needed for moderate pain or fever.   Yes [provider]  albuterol (PROAIR HFA) 108 (90 Base) MCG/ACT inhaler  2 puffs up to every 4 hours as needed only  if your can't catch your breath Patient taking differently: Inhale 2 puffs into the lungs every 4 (four) hours as needed for shortness of breath. 03/27/21  Yes Tanda Rockers, MD  albuterol (PROVENTIL) (2.5 MG/3ML) 0.083% nebulizer solution Take 2.5 mg by nebulization 3 (three) times daily as needed for wheezing or shortness  of breath.   Yes [provider]  aspirin 81 MG tablet Take 81 mg by mouth at bedtime.   Yes [provider]  chlorpheniramine-HYDROcodone 10-8 MG/5ML Take 5 mLs by mouth at bedtime as needed for cough. 08/02/21  Yes [provider]  Coenzyme Q10 (CO Q 10 PO) Take 1 capsule by mouth daily.   Yes [provider]  fluticasone (FLONASE) 50 MCG/ACT nasal spray Place 2 sprays into both nostrils as needed. Patient taking differently: Place 2 sprays into both nostrils daily as needed for allergies. 02/26/21  Yes Boscia, Greer Ee, NP  furosemide (LASIX) 20 MG tablet Take 1 tablet (20 mg total) by mouth daily. 04/09/21  Yes Abonza, Herb Grays, PA-C  levothyroxine (SYNTHROID) 25 MCG tablet TAKE 0.5 TABLETS (12.5 MCG TOTAL) BY MOUTH DAILY. IN ADDITION TO THE 50 MCG. Patient taking differently: Take 12.5 mcg by mouth daily. 11/17/19  Yes Troy Sine, MD  levothyroxine (SYNTHROID) 50 MCG tablet TAKE 1 TABLET BY MOUTH EVERY DAY BEFORE BREAKFAST Patient taking differently: Take 50 mcg by mouth daily. 04/09/21  Yes Troy Sine, MD  metoprolol succinate (TOPROL-XL) 100 MG 24 hr tablet Take 1 tablet (100 mg total) by mouth daily. Take with or immediately following a meal. Patient taking differently: Take 100 mg by mouth daily. 12/20/20  Yes Boscia, Greer Ee, NP  naproxen sodium (ALEVE) 220 MG tablet Take 220 mg by mouth daily as needed (pain).   Yes [provider]  Omega-3 Fatty Acids (OMEGA-3 PO) Take 1,000 mg by mouth in the morning and at bedtime.   Yes [provider]  OXYGEN Inhale 3.5 L into the lungs continuous.   Yes [provider]  prochlorperazine (COMPAZINE) 10 MG tablet Take 1 tablet (10 mg total) by mouth every 6 (six) hours as needed. Patient taking differently: Take 10 mg by mouth every 6 (six) hours as needed for nausea or vomiting. 08/06/21  Yes Heilingoetter, Cassandra L, PA-C  rosuvastatin (CRESTOR) 40 MG tablet Take 1 tablet (40 mg  total) by mouth daily. Patient taking differently: Take 40 mg by mouth every evening. 11/29/20  Yes Troy Sine, MD  amoxicillin-clavulanate (AUGMENTIN) 875-125 MG tablet Take 1 tablet by mouth 2 (two) times daily. 10 day course. On day 6 as of 07/25/21 Patient not taking: Reported on 08/06/2021    [provider]  fluconazole (DIFLUCAN) 100 MG tablet Take 1 tablet (100 mg total) by mouth daily. Patient taking differently: Take 100 mg by mouth See admin instructions. Qd x 10 days 08/06/21   Heilingoetter, Cassandra L, PA-C  lidocaine-prilocaine (EMLA) cream Apply 1 Application topically as needed. Patient taking differently: Apply 1 Application topically as needed (prior to port access). 08/06/21   Heilingoetter, Cassandra L, PA-C  predniSONE (DELTASONE) 20 MG tablet Take 60 mg by mouth daily. 5 day course. Last day 07/25/21. Patient not taking: Reported on 08/06/2021    [provider]    Inpatient Medications: Scheduled Meds:   Continuous Infusions:  amiodarone 60 mg/hr (08/08/21 2310)   Followed by   amiodarone     PRN Meds:  adenosine  Allergies:    Allergies  Allergen Reactions   Codeine Other (See Comments)    Unknown reaction Patient avoids this medication    Social History:   Social History   Socioeconomic History   Marital status: Married    Spouse name: Not on file   Number of children: Not on file   Years of education: Not on file   Highest education level: Not on file  Occupational History   Not on file  Tobacco Use   Smoking status: Every Day    Packs/day: 1.00    Years: 58.00    Total pack years: 58.00    Types: Cigarettes    Last attempt to quit: 03/21/2020    Years since quitting: 1.3   Smokeless tobacco: Never   Tobacco comments:    Pt states she is currently smoking  Vaping Use   Vaping Use: Never used  Substance and Sexual Activity   Alcohol use: No    Alcohol/week: 0.0 standard drinks of alcohol   Drug use: No   Sexual  activity: Never  Other Topics Concern   Not on file  Social History Narrative   Not on file   Social Determinants of Health   Financial Resource Strain: Not on file  Food Insecurity: Not on file  Transportation Needs: Not on file  Physical Activity: Not on file  Stress: Not on file  Social Connections: Not on file  Intimate Partner Violence: Not on file    Family History:   Non pertinent Family History  Problem Relation Age of Onset   Hypothyroidism Father    Heart attack Father    Aplastic anemia Maternal Grandfather    Stroke Paternal Grandfather      ROS:  Please see the history of present illness.   All other ROS reviewed and negative.     Physical Exam/Data:   Vitals:   08/08/21 2300 08/08/21 2315 08/08/21 2320 08/08/21 2345  BP: (!) 83/71 100/75 115/73 97/81  Pulse: (!) 159 (!) 157 (!) 149 (!) 151  Resp: (!) 23 20 (!) 33 (!) 39  Temp:      TempSrc:      SpO2: 99% 97% 97% 99%    Intake/Output Summary (Last 24 hours) at 08/09/2021 0010 Last data filed at 08/08/2021 2310 Gross per 24 hour  Intake 500 ml  Output --  Net 500 ml      08/06/2021    7:55 AM 07/27/2021    5:42 AM 07/25/2021    2:44 PM  Last 3 Weights  Weight (lbs) 208 lb 8 oz 212 lb 212 lb 11.2 oz  Weight (kg) 94.575 kg 96.163 kg 96.48 kg     There is no height or weight on file to calculate BMI.  General:  Well nourished, well developed, in no acute distress HEENT: normal Neck: no JVD Vascular: No carotid bruits; Distal pulses 2+ bilaterally Cardiac:  normal S1, S2; RRR; no murmur  Lungs: Rales and rhonchi bilaterally, marked decrease in breath sounds on the left. Abd: soft, nontender, no hepatomegaly  Ext: no edema Musculoskeletal:  No deformities, BUE and BLE strength normal and equal Skin: warm and dry  Neuro:  CNs 2-12 intact, no focal abnormalities noted Psych:  Normal affect   EKG:  The EKG was personally reviewed and demonstrates: Atrial flutter Telemetry:  Telemetry was  personally reviewed and demonstrates: SVT regular  Relevant CV Studies:   Laboratory Data:  High Sensitivity Troponin:   Recent Labs  Lab 08/08/21 2154  TROPONINIHS 40*     Chemistry Recent Labs  Lab 08/06/21 0744 08/07/21 1358 08/08/21 2154 08/08/21 2333  NA 139 141 141 138  K 3.7 3.4* 3.3* 3.3*  CL 92* 92* 94*  --   CO2 43* 38* 36*  --   GLUCOSE 123* 161* 156*  --   BUN 13 13 8   --   CREATININE 0.80 0.92 0.79  --   CALCIUM 9.3 8.9 8.7*  --   MG  --   --  2.2  --   GFRNONAA >60 >60 >60  --   ANIONGAP 4* 11 11  --     Recent Labs  Lab 08/06/21 0744 08/07/21 1358 08/08/21 2154  PROT 6.3* 7.2 5.8*  ALBUMIN 3.0* 2.8* 2.1*  AST 19 32 43*  ALT 32 47* 54*  ALKPHOS 71 71 70  BILITOT 0.4 0.6 0.4   Lipids No results for input(s): "CHOL", "TRIG", "HDL", "LABVLDL", "LDLCALC", "CHOLHDL" in the last 168 hours.  Hematology Recent Labs  Lab 08/06/21 0744 08/07/21 1358 08/08/21 2154 08/08/21 2333  WBC 14.4* 13.6* 13.3*  --   RBC 4.22 4.75 4.15  --   HGB 11.2* 12.6 10.8* 11.9*  HCT 37.4 43.4 37.3 35.0*  MCV 88.6 91.4 89.9  --   MCH 26.5 26.5 26.0  --   MCHC 29.9* 29.0* 29.0*  --   RDW 15.7* 15.9* 15.8*  --   PLT 213 258 281  --    Thyroid  Recent Labs  Lab 08/07/21 1358 08/08/21 2154  TSH 2.761 2.100  FREET4 1.15*  --     BNP Recent Labs  Lab 08/08/21 2154  BNP 360.1*    DDimer No results for input(s): "DDIMER" in the last 168 hours.   Radiology/Studies:  CT CHEST W CONTRAST  Result Date: 08/07/2021 CLINICAL DATA:  Pleural effusion, malignancy suspected EXAM: CT CHEST WITH CONTRAST TECHNIQUE: Multidetector CT imaging of the chest was performed during intravenous contrast administration. RADIATION DOSE REDUCTION: This exam was performed according to the departmental dose-optimization program which includes automated exposure control, adjustment of the mA and/or kV according to patient size and/or use of iterative reconstruction technique. CONTRAST:   86mL OMNIPAQUE IOHEXOL 300 MG/ML  SOLN COMPARISON:  Pet CT 06/08/2021, chest x-ray 07/25/2021, chest x-ray 08/07/2021 FINDINGS: Cardiovascular: Normal heart size. No significant pericardial effusion. The thoracic aorta is normal in caliber. No atherosclerotic plaque of the thoracic aorta. Four-vessel coronary artery calcifications status post coronary artery bypass graft. The main pulmonary artery is normal in caliber. No central or segmental pulmonary embolus. Mediastinum/Nodes: Known left hilar mass lesion poorly visualized measuring up to at least 4 cm. No right hilar lymphadenopathy. No enlarged mediastinal or axillary lymph nodes. Thyroid gland, trachea, and esophagus demonstrate no significant findings. Lungs/Pleura: Interval complete hypodense heterogeneous opacification of the left lung consistent. Interval development of a at least small volume left pleural effusion. The right lung is clear. No right pleural effusion. No pneumothorax. Upper Abdomen: Subcentimeter splenule (2:124). No acute abnormality. Musculoskeletal: No chest wall abnormality. No suspicious lytic or blastic osseous lesions. No acute displaced fracture. Multilevel degenerative changes of the spine. Intact sternotomy wires. IMPRESSION: Interval development of postobstructive infection/collapse of the entire left lung in the setting of known left hilar mass lesion and interval development of at least small volume left pleural effusion. Electronically Signed   By: Iven Finn M.D.   On: 08/07/2021 17:38   DG Chest Port 1 View  Result Date: 08/07/2021 CLINICAL  DATA:  Shortness of breath EXAM: PORTABLE CHEST 1 VIEW COMPARISON:  Chest radiograph dated July 26, 2021 FINDINGS: Evaluation of cardiomediastinal silhouette is limited due to complete opacification of the left hemithorax. Right lung is clear. Sternotomy wires representing prior coronary artery bypass grafting unchanged. IMPRESSION: Complete opacification of the left hemithorax,  which may represent atelectasis or effusion. Right lung is clear. Electronically Signed   By: Keane Police D.O.   On: 08/07/2021 14:22     Assessment and Plan:  76 year old Caucasian female presented to the hospital without atrial flutter with RVR (Current CHA2DS2-VASc score is 4/ HASBLED2).  Complicating factors includes episodes of fevers and chills with some leukocytosis in the setting of ongoing radiation therapy, lung cancer involving left lung (concern for pericardial involvement?)  In addition patient appears dry on exam.  Electrolyte abnormality includes hypokalemia as well as metabolic alkalosis probably in the setting of chronic respiratory acidosis.  On exam patient is warm and euvolemic/dry. We discussed at last visit patient's diagnosis of atrial flutter and contributing factors.  Chart review showed prior diagnosis of paroxysmal atrial fibrillation associated symptoms critical illness/sepsis in February 2023. Plan. Start patient Amio bolus 150 mg once followed by amiodarone drip as per protocol. Continuous telemetry monitoring. Heart rate goal below 110 Please obtain Pro-Cal, CRP, manual differential, blood cultures Please obtain viral panel. Consider broad-spectrum antibiotics. Echocardiogram in the morning.   Keep potassium of 4, magnesium is 2. Start patient on Lovenox 1 mg/kg twice daily and then depositions in the apixaban > rivaroxaban Once patient is stable from respiratory perspective we may be considering TEE DCCV  Risk Assessment/Risk Scores:          CHA2DS2-VASc Score = 4    This indicates a  % annual risk of stroke. The patient's score is based upon:           For questions or updates, please contact [facility] Please consult [email or web address] for contact info under    Signed, Warren Danes, MD  08/09/2021 12:10 AM

## 2021-08-08 NOTE — Consult Note (Incomplete)
Cardiology Consultation:   Patient ID: [name] MRN: [id number]; DOB: September 28, 1945  Admit date: 08/08/2021 Date of Consult: 08/08/2021  PCP:  [name], NP   [facility] Providers Cardiologist:  None   { Click here to update MD or APP on Care Team, Refresh:1}     Patient Profile:   [name] is a 76 y.o. female with a hx of hypertension, hyperlipidemia, CABG in 2012 recent diagnosis of paroxysmal atrial fibrillation, on anticoagulation, tobacco abuse, COPD stage stage IV, on home oxygen, recent diagnosis of lung cancer non-small cell lung cancer stage 2b, currently undergoing radiation therapy, concern for the trapped lung, pulmonary emphysema and morbid obesity with recent presentation to the medical risks regular tachycardia 6/202023 who is being seen 08/08/2021 for the evaluation of A-fib/a flutter RVR at the request of emergency room.    History of Present Illness:   Ms. Boot very pleasant Caucasian female developed episode of palpitations very similar to the episode yesterday.  Patient is well documented since episodes of atrial flutter.Patient received multiple rounds of adenosine without termination of tachycardia.  Unfortunately strips are not available for me for review. Subsequently patient received IV metoprolol that helped with the rate control for some time however soon after patient went back to a target of 150 and 160.  At bedside patient is hemodynamically stable.  She is not in acute distress.  Patient is noted to be tachypneic with a probably about 18-20 times a minute.  Past Medical History:  Diagnosis Date  . CAD (coronary artery disease)   . Cancer ([place])   . COPD (chronic obstructive pulmonary disease) ([place])    per DM note, pt denies  . Dyslipidemia   . GERD (gastroesophageal reflux disease)   . History of radiation therapy    Left lung- 01/16/21-01/30/21- Dr. Gery Pray  . HTN (hypertension)   . Hypothyroidism   . Myocardial  infarct ([place])   . Non-small cell lung cancer ([place]) 08/19/2010   Stage IA, status post left upper lobectomy July 2012  . Tobacco abuse     Past Surgical History:  Procedure Laterality Date  . CARDIAC CATHETERIZATION  07/06/2010   see CABG report - pt sent to OR  . CARDIOVASCULAR STRESS TEST  09/11/2010   R/S MV - normal perfusion in all regions, EF 46%, no scintigraphic evidence of inducible myocardial ischemia; global LV systolic function mildly reduced; no significant wall motion abnormalities noted; Exercise capacity 7 METS; EKG negative for ischemia; low risk study, no signifcant change from previous study 08/2003  . CORONARY ARTERY BYPASS GRAFT  07/11/2010   LIMA to LAD; SVG to 2nd branch OM; SVG to posterior descending artery  . LEFT VATS  09/17/2010  . TEE WITHOUT CARDIOVERSION  07/06/2010   during emergent CABG surgery; 2-3+ mitral regurgitation  . VIDEO BRONCHOSCOPY N/A 07/27/2021   Procedure: VIDEO BRONCHOSCOPY;  Surgeon: Melrose Nakayama, MD;  Location: [facility];  Service: Thoracic;  Laterality: N/A;  . VIDEO BRONCHOSCOPY WITH ENDOBRONCHIAL ULTRASOUND N/A 07/27/2021   Procedure: VIDEO BRONCHOSCOPY WITH ENDOBRONCHIAL ULTRASOUND;  Surgeon: Melrose Nakayama, MD;  Location: [facility] OR;  Service: Thoracic;  Laterality: N/A;     {Home Medications (Optional):21181}  Inpatient Medications: Scheduled Meds: . amiodarone  150 mg Intravenous Once   Continuous Infusions: . amiodarone     Followed by  . [START ON 08/09/2021] amiodarone    . ceFEPime (MAXIPIME) IV     PRN Meds: adenosine  Allergies:    Allergies  Allergen Reactions  . Codeine Other (See Comments)    Unknown reaction Patient avoids this medication    Social History:   Social History   Socioeconomic History  . Marital status: Married    Spouse name: Not on file  . Number of children: Not on file  . Years of education: Not on file  . Highest education level: Not on file  Occupational History  . Not on file   Tobacco Use  . Smoking status: Every Day    Packs/day: 1.00    Years: 58.00    Total pack years: 58.00    Types: Cigarettes    Last attempt to quit: 03/21/2020    Years since quitting: 1.3  . Smokeless tobacco: Never  . Tobacco comments:    Pt states she is currently smoking  Vaping Use  . Vaping Use: Never used  Substance and Sexual Activity  . Alcohol use: No    Alcohol/week: 0.0 standard drinks of alcohol  . Drug use: No  . Sexual activity: Never  Other Topics Concern  . Not on file  Social History Narrative  . Not on file   Social Determinants of Health   Financial Resource Strain: Not on file  Food Insecurity: Not on file  Transportation Needs: Not on file  Physical Activity: Not on file  Stress: Not on file  Social Connections: Not on file  Intimate Partner Violence: Not on file    Family History:   *** Family History  Problem Relation Age of Onset  . Hypothyroidism Father   . Heart attack Father   . Aplastic anemia Maternal Grandfather   . Stroke Paternal Grandfather      ROS:  Please see the history of present illness.  *** All other ROS reviewed and negative.     Physical Exam/Data:   Vitals:   08/08/21 2222 08/08/21 2223 08/08/21 2224 08/08/21 2225  BP: (!) 82/63  (!) 82/41 (!) 91/52  Pulse: (!) 159 (!) 161 (!) 160 (!) 156  Resp: 18  (!) 23 19  Temp:      TempSrc:      SpO2: 98%  97% 98%   No intake or output data in the 24 hours ending 08/08/21 2301    08/06/2021    7:55 AM 07/27/2021    5:42 AM 07/25/2021    2:44 PM  Last 3 Weights  Weight (lbs) 208 lb 8 oz 212 lb 212 lb 11.2 oz  Weight (kg) 94.575 kg 96.163 kg 96.48 kg     There is no height or weight on file to calculate BMI.  General:  Well nourished, well developed, in no acute distress HEENT: normal Neck: no JVD Vascular: No carotid bruits; Distal pulses 2+ bilaterally Cardiac:  normal S1, S2; RRR; no murmur  Lungs: Rales and rhonchi bilaterally, marked decrease in breath sounds  on the left. Abd: soft, nontender, no hepatomegaly  Ext: no edema Musculoskeletal:  No deformities, BUE and BLE strength normal and equal Skin: warm and dry  Neuro:  CNs 2-12 intact, no focal abnormalities noted Psych:  Normal affect   EKG:  The EKG was personally reviewed and demonstrates: Atrial flutter Telemetry:  Telemetry was personally reviewed and demonstrates: SVT regular  Relevant CV Studies:   Laboratory Data:  High Sensitivity Troponin:   Recent Labs  Lab 08/08/21 2154  TROPONINIHS 40*     Chemistry Recent Labs  Lab 08/06/21 0744 08/07/21 1358 08/08/21 2154  NA 139 141 141  K 3.7  3.4* 3.3*  CL 92* 92* 94*  CO2 43* 38* 36*  GLUCOSE 123* 161* 156*  BUN 13 13 8   CREATININE 0.80 0.92 0.79  CALCIUM 9.3 8.9 8.7*  MG  --   --  2.2  GFRNONAA >60 >60 >60  ANIONGAP 4* 11 11    Recent Labs  Lab 08/06/21 0744 08/07/21 1358 08/08/21 2154  PROT 6.3* 7.2 5.8*  ALBUMIN 3.0* 2.8* 2.1*  AST 19 32 43*  ALT 32 47* 54*  ALKPHOS 71 71 70  BILITOT 0.4 0.6 0.4   Lipids No results for input(s): "CHOL", "TRIG", "HDL", "LABVLDL", "LDLCALC", "CHOLHDL" in the last 168 hours.  Hematology Recent Labs  Lab 08/06/21 0744 08/07/21 1358 08/08/21 2154  WBC 14.4* 13.6* 13.3*  RBC 4.22 4.75 4.15  HGB 11.2* 12.6 10.8*  HCT 37.4 43.4 37.3  MCV 88.6 91.4 89.9  MCH 26.5 26.5 26.0  MCHC 29.9* 29.0* 29.0*  RDW 15.7* 15.9* 15.8*  PLT 213 258 281   Thyroid  Recent Labs  Lab 08/07/21 1358 08/08/21 2154  TSH 2.761 2.100  FREET4 1.15*  --     BNPNo results for input(s): "BNP", "PROBNP" in the last 168 hours.  DDimer No results for input(s): "DDIMER" in the last 168 hours.   Radiology/Studies:  CT CHEST W CONTRAST  Result Date: 08/07/2021 CLINICAL DATA:  Pleural effusion, malignancy suspected EXAM: CT CHEST WITH CONTRAST TECHNIQUE: Multidetector CT imaging of the chest was performed during intravenous contrast administration. RADIATION DOSE REDUCTION: This exam was  performed according to the departmental dose-optimization program which includes automated exposure control, adjustment of the mA and/or kV according to patient size and/or use of iterative reconstruction technique. CONTRAST:  18mL OMNIPAQUE IOHEXOL 300 MG/ML  SOLN COMPARISON:  Pet CT 06/08/2021, chest x-ray 07/25/2021, chest x-ray 08/07/2021 FINDINGS: Cardiovascular: Normal heart size. No significant pericardial effusion. The thoracic aorta is normal in caliber. No atherosclerotic plaque of the thoracic aorta. Four-vessel coronary artery calcifications status post coronary artery bypass graft. The main pulmonary artery is normal in caliber. No central or segmental pulmonary embolus. Mediastinum/Nodes: Known left hilar mass lesion poorly visualized measuring up to at least 4 cm. No right hilar lymphadenopathy. No enlarged mediastinal or axillary lymph nodes. Thyroid gland, trachea, and esophagus demonstrate no significant findings. Lungs/Pleura: Interval complete hypodense heterogeneous opacification of the left lung consistent. Interval development of a at least small volume left pleural effusion. The right lung is clear. No right pleural effusion. No pneumothorax. Upper Abdomen: Subcentimeter splenule (2:124). No acute abnormality. Musculoskeletal: No chest wall abnormality. No suspicious lytic or blastic osseous lesions. No acute displaced fracture. Multilevel degenerative changes of the spine. Intact sternotomy wires. IMPRESSION: Interval development of postobstructive infection/collapse of the entire left lung in the setting of known left hilar mass lesion and interval development of at least small volume left pleural effusion. Electronically Signed   By: Iven Finn M.D.   On: 08/07/2021 17:38   DG Chest Port 1 View  Result Date: 08/07/2021 CLINICAL DATA:  Shortness of breath EXAM: PORTABLE CHEST 1 VIEW COMPARISON:  Chest radiograph dated July 26, 2021 FINDINGS: Evaluation of cardiomediastinal  silhouette is limited due to complete opacification of the left hemithorax. Right lung is clear. Sternotomy wires representing prior coronary artery bypass grafting unchanged. IMPRESSION: Complete opacification of the left hemithorax, which may represent atelectasis or effusion. Right lung is clear. Electronically Signed   By: Keane Police D.O.   On: 08/07/2021 14:22     Assessment and Plan:  76 year old Caucasian female presented to the hospital without atrial flutter with RVR.  Complicating factors includes episodes of fevers and chills with some leukocytosis in the setting of ongoing radiation therapy, lung cancer involving left lung   Risk Assessment/Risk Scores:  {Complete the following score calculators/questions to meet required metrics.  Press F2         :[id number]}   {Is the patient being seen for CHEST PAIN, UNSTABLE ANGINA, NSTEMI or STEMI?     :[id number]}    [id number] Score =    {Click here to calculate score.  REFRESH note before signing. :1} This indicates a  % annual risk of stroke. The patient's score is based upon:       {Are we signing off today?:[id number]}  For questions or updates, please contact [place] Please consult [email or web address] for contact info under    Signed, Warren Danes, MD  08/08/2021 11:01 PM

## 2021-08-08 NOTE — ED Triage Notes (Addendum)
PER EMS: pt is from home, pt began to feel some heart palpations around 6:30pm tonight associated with hot flashes and she felt clammy. HR was 160 sinus tach. 400cc NS given en route by EMS.  Pt was seen yesterday at [facility] ER for the same issue, was give Metoprolol. BP-108/67  She has stage lung cancer and is scheduled to start Chemo and radiation next week. 3.5L O2 [place] at baseline.

## 2021-08-08 NOTE — ED Provider Notes (Incomplete)
[facility] EMERGENCY DEPARTMENT Provider Note   CSN: [id number] Arrival date & time: 08/08/21  2124     History {Add pertinent medical, surgical, social history, OB history to HPI:1} Chief Complaint  Patient presents with   Tachycardia    [name] is a 76 y.o. female.  Patient brought in by EMS with palpitations and tachycardia that onset around 6:30 PM while she was sitting resting.  This is associated with hot flashes and clamminess.  Seen yesterday for SVT and converted with metoprolol.  She has a complicated history of lung cancer as well as opacification of her left hemithorax and is undergoing radiation.  She is on 3-1/2 L of oxygen at baseline.  Yesterday was her first episode of SVT.  She takes metoprolol at home. Denies any chest pain, cough or fever.  Does have some intermittent leg swelling.  No abdominal pain, nausea or vomiting.  The history is provided by the patient and the EMS personnel.       Home Medications Prior to Admission medications   Medication Sig Start Date End Date Taking? Authorizing Provider  albuterol (PROAIR HFA) 108 (90 Base) MCG/ACT inhaler 2 puffs up to every 4 hours as needed only  if your can't catch your breath Patient taking differently: Inhale 2 puffs into the lungs every 4 (four) hours as needed for shortness of breath. 03/27/21   Tanda Rockers, MD  albuterol (PROVENTIL) (2.5 MG/3ML) 0.083% nebulizer solution Take 2.5 mg by nebulization 3 (three) times daily as needed for wheezing or shortness of breath.    [provider]  amoxicillin-clavulanate (AUGMENTIN) 875-125 MG tablet Take 1 tablet by mouth 2 (two) times daily. 10 day course. On day 6 as of 07/25/21 Patient not taking: Reported on 08/06/2021    [provider]  aspirin 81 MG tablet Take 81 mg by mouth daily.    [provider]  Coenzyme Q10 (CO Q 10 PO) Take 1 capsule by mouth daily.    [provider]   dextromethorphan-guaiFENesin (MUCINEX DM) 30-600 MG 12hr tablet Take 1 tablet by mouth as needed for cough. Patient not taking: Reported on 08/06/2021    [provider]  fluconazole (DIFLUCAN) 100 MG tablet Take 1 tablet (100 mg total) by mouth daily. 08/06/21   Heilingoetter, Cassandra L, PA-C  fluticasone (FLONASE) 50 MCG/ACT nasal spray Place 2 sprays into both nostrils as needed. 02/26/21   [name], NP  furosemide (LASIX) 20 MG tablet Take 1 tablet (20 mg total) by mouth daily. 04/09/21   Lorrene Reid, PA-C  ibuprofen (ADVIL) 200 MG tablet Take 200 mg by mouth daily as needed for mild pain. Patient not taking: Reported on 08/06/2021    [provider]  levothyroxine (SYNTHROID) 25 MCG tablet TAKE 0.5 TABLETS (12.5 MCG TOTAL) BY MOUTH DAILY. IN ADDITION TO THE 50 MCG. Patient taking differently: Take 12.5 mcg by mouth daily. 11/17/19   Troy Sine, MD  levothyroxine (SYNTHROID) 50 MCG tablet TAKE 1 TABLET BY MOUTH EVERY DAY BEFORE BREAKFAST Patient taking differently: Take 50 mcg by mouth daily. 04/09/21   Troy Sine, MD  lidocaine-prilocaine (EMLA) cream Apply 1 Application topically as needed. 08/06/21   Heilingoetter, Cassandra L, PA-C  metoprolol succinate (TOPROL-XL) 100 MG 24 hr tablet Take 1 tablet (100 mg total) by mouth daily. Take with or immediately following a meal. Patient taking differently: Take 100 mg by mouth daily. 12/20/20   [name], NP  naproxen  sodium (ALEVE) 220 MG tablet Take 220 mg by mouth daily as needed (pain).    [provider]  Omega-3 Fatty Acids (OMEGA-3 PO) Take 1 capsule by mouth daily.    [provider]  OXYGEN Inhale 2 L into the lungs as needed.    [provider]  predniSONE (DELTASONE) 20 MG tablet Take 60 mg by mouth daily. 5 day course. Last day 07/25/21. Patient not taking: Reported on 08/06/2021    [provider]  prochlorperazine (COMPAZINE) 10 MG tablet Take 1 tablet (10 mg  total) by mouth every 6 (six) hours as needed. 08/06/21   Heilingoetter, Cassandra L, PA-C  rosuvastatin (CRESTOR) 40 MG tablet Take 1 tablet (40 mg total) by mouth daily. 11/29/20   Troy Sine, MD      Allergies    Codeine    Review of Systems   Review of Systems  Constitutional:  Negative for activity change, appetite change and fever.  HENT:  Negative for congestion and rhinorrhea.   Respiratory:  Positive for chest tightness. Negative for shortness of breath.   Cardiovascular:  Positive for palpitations. Negative for chest pain.  Gastrointestinal:  Negative for abdominal pain, nausea and vomiting.  Genitourinary:  Negative for dysuria and hematuria.  Musculoskeletal:  Negative for arthralgias and myalgias.  Skin:  Negative for rash.  Neurological:  Negative for dizziness, weakness, light-headedness and headaches.   all other systems are negative except as noted in the HPI and PMH.    Physical Exam Updated Vital Signs BP 104/69 (BP Location: Right Arm)   Pulse (!) 166   Temp 99.1 F (37.3 C) (Oral)   Resp 18   SpO2 99%  Physical Exam Vitals and nursing note reviewed.  Constitutional:      General: She is not in acute distress.    Appearance: She is well-developed.  HENT:     Head: Normocephalic and atraumatic.     Mouth/Throat:     Pharynx: No oropharyngeal exudate.  Eyes:     Conjunctiva/sclera: Conjunctivae normal.     Pupils: Pupils are equal, round, and reactive to light.  Neck:     Comments: No meningismus. Cardiovascular:     Rate and Rhythm: Regular rhythm. Tachycardia present.     Heart sounds: Normal heart sounds. No murmur heard. Pulmonary:     Effort: Pulmonary effort is normal. No respiratory distress.     Breath sounds: Normal breath sounds.  Abdominal:     Palpations: Abdomen is soft.     Tenderness: There is no abdominal tenderness. There is no guarding or rebound.  Musculoskeletal:        General: No tenderness. Normal range of motion.      Cervical back: Normal range of motion and neck supple.     Right lower leg: Edema present.     Left lower leg: Edema present.  Skin:    General: Skin is warm.  Neurological:     Mental Status: She is alert and oriented to person, place, and time.     Cranial Nerves: No cranial nerve deficit.     Motor: No abnormal muscle tone.     Coordination: Coordination normal.     Comments:  5/5 strength throughout. CN 2-12 intact.Equal grip strength.   Psychiatric:        Behavior: Behavior normal.     ED Results / Procedures / Treatments   Labs (all labs ordered are listed, but only abnormal results are displayed) Labs Reviewed  CBC WITH DIFFERENTIAL/PLATELET - Abnormal; Notable for the following components:      Result Value   WBC 13.3 (*)    Hemoglobin 10.8 (*)    MCHC 29.0 (*)    RDW 15.8 (*)    Neutro Abs 10.3 (*)    Monocytes Absolute 1.9 (*)    Abs Immature Granulocytes 0.08 (*)    All other components within normal limits  COMPREHENSIVE METABOLIC PANEL - Abnormal; Notable for the following components:   Potassium 3.3 (*)    Chloride 94 (*)    CO2 36 (*)    Glucose, Bld 156 (*)    Calcium 8.7 (*)    Total Protein 5.8 (*)    Albumin 2.1 (*)    AST 43 (*)    ALT 54 (*)    All other components within normal limits  BRAIN NATRIURETIC PEPTIDE - Abnormal; Notable for the following components:   B Natriuretic Peptide 360.1 (*)    All other components within normal limits  I-STAT VENOUS BLOOD GAS, ED - Abnormal; Notable for the following components:   pH, Ven 7.471 (*)    pO2, Ven 150 (*)    Bicarbonate 38.5 (*)    TCO2 40 (*)    Acid-Base Excess 13.0 (*)    Potassium 3.3 (*)    Calcium, Ion 1.05 (*)    HCT 35.0 (*)    Hemoglobin 11.9 (*)    All other components within normal limits  CBG MONITORING, ED - Abnormal; Notable for the following components:   Glucose-Capillary 176 (*)    All other components within normal limits  TROPONIN I (HIGH SENSITIVITY) - Abnormal;  Notable for the following components:   Troponin I (High Sensitivity) 40 (*)    All other components within normal limits  CULTURE, BLOOD (ROUTINE X 2)  CULTURE, BLOOD (ROUTINE X 2)  MRSA NEXT GEN BY PCR, NASAL  MAGNESIUM  TSH  LACTIC ACID, PLASMA  LACTIC ACID, PLASMA  URINALYSIS, ROUTINE W REFLEX MICROSCOPIC  TROPONIN I (HIGH SENSITIVITY)    EKG EKG Interpretation  Date/Time:  Wednesday August 08 2021 21:38:04 EDT Ventricular Rate:  168 PR Interval:    QRS Duration: 104 QT Interval:  238 QTC Calculation: 397 R Axis:   65 Text Interpretation: Supraventricular tachycardia Anterior infarct , age undetermined ST & T wave abnormality, consider inferior ischemia Abnormal ECG When compared with ECG of 07-Aug-2021 14:12, PREVIOUS ECG IS PRESENT Supraventricular tachycardia Confirmed by Ezequiel Essex [phone]) on 08/08/2021 9:49:25 PM  Radiology CT CHEST W CONTRAST  Result Date: 08/07/2021 CLINICAL DATA:  Pleural effusion, malignancy suspected EXAM: CT CHEST WITH CONTRAST TECHNIQUE: Multidetector CT imaging of the chest was performed during intravenous contrast administration. RADIATION DOSE REDUCTION: This exam was performed according to the departmental dose-optimization program which includes automated exposure control, adjustment of the mA and/or kV according to patient size and/or use of iterative reconstruction technique. CONTRAST:  65mL OMNIPAQUE IOHEXOL 300 MG/ML  SOLN COMPARISON:  Pet CT 06/08/2021, chest x-ray 07/25/2021, chest x-ray 08/07/2021 FINDINGS: Cardiovascular: Normal heart size. No significant pericardial effusion. The thoracic aorta is normal in caliber. No atherosclerotic plaque of the thoracic aorta. Four-vessel coronary artery calcifications status post coronary artery bypass graft. The main pulmonary artery is normal in caliber. No central or segmental pulmonary embolus. Mediastinum/Nodes: Known left hilar mass lesion poorly visualized measuring up to at least 4 cm. No  right hilar lymphadenopathy. No enlarged mediastinal or axillary lymph nodes. Thyroid gland, trachea, and esophagus demonstrate no significant  findings. Lungs/Pleura: Interval complete hypodense heterogeneous opacification of the left lung consistent. Interval development of a at least small volume left pleural effusion. The right lung is clear. No right pleural effusion. No pneumothorax. Upper Abdomen: Subcentimeter splenule (2:124). No acute abnormality. Musculoskeletal: No chest wall abnormality. No suspicious lytic or blastic osseous lesions. No acute displaced fracture. Multilevel degenerative changes of the spine. Intact sternotomy wires. IMPRESSION: Interval development of postobstructive infection/collapse of the entire left lung in the setting of known left hilar mass lesion and interval development of at least small volume left pleural effusion. Electronically Signed   By: Iven Finn M.D.   On: 08/07/2021 17:38   DG Chest Port 1 View  Result Date: 08/07/2021 CLINICAL DATA:  Shortness of breath EXAM: PORTABLE CHEST 1 VIEW COMPARISON:  Chest radiograph dated July 26, 2021 FINDINGS: Evaluation of cardiomediastinal silhouette is limited due to complete opacification of the left hemithorax. Right lung is clear. Sternotomy wires representing prior coronary artery bypass grafting unchanged. IMPRESSION: Complete opacification of the left hemithorax, which may represent atelectasis or effusion. Right lung is clear. Electronically Signed   By: Keane Police D.O.   On: 08/07/2021 14:22    Procedures .Cardioversion  Date/Time: 08/08/2021 10:14 PM  Performed by: Ezequiel Essex, MD Authorized by: Ezequiel Essex, MD   Consent:    Consent obtained:  Verbal   Consent given by:  Patient   Alternatives discussed:  Rate-control medication and delayed treatment Pre-procedure details:    Cardioversion basis:  Emergent   Rhythm:  Supraventricular tachycardia   Electrode placement:   Anterior-posterior Patient sedated: No Attempt one:    Shock outcome:  No change in rhythm Post-procedure details:    Patient status:  Awake Comments:     Attempted cardioversion with adenosine x2.  This was not successful.  Went back into narrow complex tachycardia in the 160s.  After metoprolol rate has slowed to the 1 teens and 120s and appears to be irregular atrial fibrillation.  .Critical Care  Performed by: Ezequiel Essex, MD Authorized by: Ezequiel Essex, MD   Critical care provider statement:    Critical care time (minutes):  45   Critical care time was exclusive of:  Separately billable procedures and treating other patients   Critical care was necessary to treat or prevent imminent or life-threatening deterioration of the following conditions: Rapid atrial flutter with RVR versus SVT.   Critical care was time spent personally by me on the following activities:  Development of treatment plan with patient or surrogate, discussions with consultants, evaluation of patient's response to treatment, examination of patient, ordering and review of laboratory studies, ordering and review of radiographic studies, ordering and performing treatments and interventions, pulse oximetry, re-evaluation of patient's condition and review of old charts   I assumed direction of critical care for this patient from another provider in my specialty: no     Care discussed with: admitting provider     {Document cardiac monitor, telemetry assessment procedure when appropriate:1}  Medications Ordered in ED Medications  adenosine (ADENOCARD) 6 MG/2ML injection 6 mg (has no administration in time range)    ED Course/ Medical Decision Making/ A&P                           Medical Decision Making Amount and/or Complexity of Data Reviewed Labs: ordered. Decision-making details documented in ED Course. Radiology: ordered and independent interpretation performed. Decision-making details documented in ED  Course. ECG/medicine tests: ordered  and independent interpretation performed. Decision-making details documented in ED Course.  Risk Prescription drug management. Decision regarding hospitalization.  Return visit with SVT again.  Blood pressure mental status are stable.  Denies chest pain.  Cardioversion not successful as above.  After adenosine patient went back to rapid tachycardia in the 160s and 170s.  With metoprolol heart rate became irregular and appeared to be atrial fibrillation and flutter.  However she quickly went back up to the 160s.  No previous history of atrial fibrillation.  Question whether she was actually in atrial fibrillation yesterday before she was cardioverted. Discussed with cardiology Dr. Wilmon Pali will evaluate patient.  Agrees with amiodarone over Cardizem given her hypotension.  Seen by cardiology who agrees presentation is consistent with atrial flutter.  In recommends amiodarone.  Will obtain CT brain to rule out brain metastasis before initiating anticoagulation.  Patient's chest x-ray findings are nonacute.  She is has complete opacification of her left lung due to compression from the lung tumor.  ABG will be obtained to assess for hypercarbia.  Continue amiodarone drip.  Blood pressure remains soft in the 90s but this is after patient received metoprolol and adenosine.  She is mentating well.  Low suspicion for acute infection.  No fever.  Lactate and blood cultures are pending. ABG shows no significant CO2 retention.  Plan admission for intractable A-flutter with RVR.  Blood pressure and  mental status remained stable.  Admission discussed with Dr. Alcario Drought   {Document critical care time when appropriate:1} {Document review of labs and clinical decision tools ie heart score, Chads2Vasc2 etc:1}  {Document your independent review of radiology images, and any outside records:1} {Document your discussion with family members, caretakers, and with  consultants:1} {Document social determinants of health affecting pt's care:1} {Document your decision making why or why not admission, treatments were needed:1} Final Clinical Impression(s) / ED Diagnoses Final diagnoses:  None    Rx / DC Orders ED Discharge Orders     None

## 2021-08-08 NOTE — ED Notes (Signed)
Verbal order received from Dr. Hurley Cisco to discontinue the Vancomycin and the Cefepime

## 2021-08-08 NOTE — ED Notes (Signed)
Dr. Gardener at bedside. 

## 2021-08-08 NOTE — Progress Notes (Signed)
Pharmacy Antibiotic Note  [name] is a 76 y.o. female admitted on 08/08/2021 presenting with tachycardia, concern for pna.  Pharmacy has been consulted for vancomycin dosing.  Plan: Vancomycin 1500 mg IV x 1, then 1000 mg IV q 24h (eAUC 452, SCr used 0.8) Add MRSA PCR Monitor renal function, Cx/PCR to narrow Vancomycin levels as needed     Temp (24hrs), Avg:99.1 F (37.3 C), Min:99.1 F (37.3 C), Max:99.1 F (37.3 C)  Recent Labs  Lab 08/06/21 0744 08/07/21 1358 08/08/21 2154  WBC 14.4* 13.6* 13.3*  CREATININE 0.80 0.92 0.79    Estimated Creatinine Clearance: 67.5 mL/min (by C-G formula based on SCr of 0.79 mg/dL).    Allergies  Allergen Reactions   Codeine Other (See Comments)    Unknown reaction Patient avoids this medication    Bertis Ruddy, PharmD Clinical Pharmacist ED Pharmacist Phone # [phone] 08/08/2021 10:58 PM

## 2021-08-08 NOTE — Progress Notes (Signed)
Note placed for registration to provide my card at check-in.

## 2021-08-09 ENCOUNTER — Ambulatory Visit: Payer: Medicare HMO

## 2021-08-09 ENCOUNTER — Other Ambulatory Visit (HOSPITAL_COMMUNITY): Payer: Self-pay | Admitting: Student

## 2021-08-09 ENCOUNTER — Telehealth: Payer: Self-pay | Admitting: Oncology

## 2021-08-09 ENCOUNTER — Observation Stay (HOSPITAL_COMMUNITY): Payer: Medicare HMO

## 2021-08-09 ENCOUNTER — Other Ambulatory Visit: Payer: Self-pay

## 2021-08-09 DIAGNOSIS — I480 Paroxysmal atrial fibrillation: Secondary | ICD-10-CM | POA: Diagnosis not present

## 2021-08-09 DIAGNOSIS — C3492 Malignant neoplasm of unspecified part of left bronchus or lung: Secondary | ICD-10-CM | POA: Diagnosis not present

## 2021-08-09 DIAGNOSIS — I509 Heart failure, unspecified: Secondary | ICD-10-CM | POA: Diagnosis not present

## 2021-08-09 DIAGNOSIS — I10 Essential (primary) hypertension: Secondary | ICD-10-CM | POA: Diagnosis not present

## 2021-08-09 DIAGNOSIS — C3490 Malignant neoplasm of unspecified part of unspecified bronchus or lung: Secondary | ICD-10-CM | POA: Diagnosis not present

## 2021-08-09 DIAGNOSIS — C799 Secondary malignant neoplasm of unspecified site: Secondary | ICD-10-CM | POA: Diagnosis not present

## 2021-08-09 DIAGNOSIS — J9611 Chronic respiratory failure with hypoxia: Secondary | ICD-10-CM | POA: Diagnosis not present

## 2021-08-09 DIAGNOSIS — I4892 Unspecified atrial flutter: Secondary | ICD-10-CM | POA: Diagnosis not present

## 2021-08-09 DIAGNOSIS — C3410 Malignant neoplasm of upper lobe, unspecified bronchus or lung: Secondary | ICD-10-CM

## 2021-08-09 DIAGNOSIS — J9612 Chronic respiratory failure with hypercapnia: Secondary | ICD-10-CM | POA: Diagnosis not present

## 2021-08-09 DIAGNOSIS — J9811 Atelectasis: Secondary | ICD-10-CM | POA: Diagnosis present

## 2021-08-09 LAB — URINALYSIS, ROUTINE W REFLEX MICROSCOPIC
Bilirubin Urine: NEGATIVE
Glucose, UA: NEGATIVE mg/dL
Hgb urine dipstick: NEGATIVE
Ketones, ur: NEGATIVE mg/dL
Nitrite: NEGATIVE
Protein, ur: 30 mg/dL — AB
Specific Gravity, Urine: 1.006 (ref 1.005–1.030)
pH: 7 (ref 5.0–8.0)

## 2021-08-09 LAB — CBC
HCT: 38 % (ref 36.0–46.0)
Hemoglobin: 10.8 g/dL — ABNORMAL LOW (ref 12.0–15.0)
MCH: 25.9 pg — ABNORMAL LOW (ref 26.0–34.0)
MCHC: 28.4 g/dL — ABNORMAL LOW (ref 30.0–36.0)
MCV: 91.1 fL (ref 80.0–100.0)
Platelets: 278 10*3/uL (ref 150–400)
RBC: 4.17 MIL/uL (ref 3.87–5.11)
RDW: 15.9 % — ABNORMAL HIGH (ref 11.5–15.5)
WBC: 10.5 10*3/uL (ref 4.0–10.5)
nRBC: 0 % (ref 0.0–0.2)

## 2021-08-09 LAB — BASIC METABOLIC PANEL
Anion gap: 8 (ref 5–15)
BUN: 8 mg/dL (ref 8–23)
CO2: 37 mmol/L — ABNORMAL HIGH (ref 22–32)
Calcium: 8.6 mg/dL — ABNORMAL LOW (ref 8.9–10.3)
Chloride: 97 mmol/L — ABNORMAL LOW (ref 98–111)
Creatinine, Ser: 0.87 mg/dL (ref 0.44–1.00)
GFR, Estimated: >60 mL/min (ref >60)
Glucose, Bld: 107 mg/dL — ABNORMAL HIGH (ref 70–99)
Potassium: 3.5 mmol/L (ref 3.5–5.1)
Sodium: 142 mmol/L (ref 135–145)

## 2021-08-09 LAB — PROCALCITONIN: Procalcitonin: <0.1 ng/mL

## 2021-08-09 LAB — TROPONIN I (HIGH SENSITIVITY): Troponin I (High Sensitivity): 52 ng/L — ABNORMAL HIGH (ref <18)

## 2021-08-09 MED ORDER — ENOXAPARIN SODIUM 40 MG/0.4ML IJ SOSY
40.0000 mg | PREFILLED_SYRINGE | INTRAMUSCULAR | Status: DC
Start: 2021-08-09 — End: 2021-08-11
  Administered 2021-08-09 – 2021-08-10 (×2): 40 mg via SUBCUTANEOUS
  Filled 2021-08-09 (×3): qty 0.4

## 2021-08-09 MED ORDER — SODIUM CHLORIDE 0.9 % IV SOLN
1.0000 g | INTRAVENOUS | Status: DC
  Administered 2021-08-09 – 2021-08-11 (×3): 1 g via INTRAVENOUS
  Filled 2021-08-09 (×3): qty 10

## 2021-08-09 MED ORDER — LEVOTHYROXINE SODIUM 25 MCG PO TABS: 12.5000 ug | ORAL_TABLET | ORAL | Status: DC

## 2021-08-09 MED ORDER — FLUCONAZOLE 100 MG PO TABS
100.0000 mg | ORAL_TABLET | Freq: Every day | ORAL | Status: DC
Start: 2021-08-09 — End: 2021-08-11
  Filled 2021-08-09 (×3): qty 1

## 2021-08-09 MED ORDER — ACETAMINOPHEN 325 MG PO TABS: 650.0000 mg | ORAL_TABLET | ORAL | Status: DC | PRN

## 2021-08-09 MED ORDER — ALBUTEROL SULFATE (2.5 MG/3ML) 0.083% IN NEBU
2.5000 mg | INHALATION_SOLUTION | RESPIRATORY_TRACT | Status: DC | PRN
Start: 2021-08-09 — End: 2021-08-11

## 2021-08-09 MED ORDER — ONDANSETRON HCL 4 MG/2ML IJ SOLN: 4.0000 mg | Freq: Four times a day (QID) | INTRAMUSCULAR | Status: DC | PRN

## 2021-08-09 MED ORDER — HYDROCOD POLI-CHLORPHE POLI ER 10-8 MG/5ML PO SUER: 5.0000 mL | Freq: Every evening | ORAL | Status: DC | PRN

## 2021-08-09 MED ORDER — LEVOTHYROXINE SODIUM 50 MCG PO TABS
62.5000 ug | ORAL_TABLET | ORAL | Status: DC
  Administered 2021-08-09 – 2021-08-11 (×3): 62.5 ug via ORAL
  Filled 2021-08-09 (×3): qty 1

## 2021-08-09 MED ORDER — ROSUVASTATIN CALCIUM 5 MG PO TABS
40.0000 mg | ORAL_TABLET | Freq: Every evening | ORAL | Status: DC
  Administered 2021-08-09: 40 mg via ORAL
  Filled 2021-08-09 (×2): qty 8

## 2021-08-09 MED ORDER — NAPROXEN 250 MG PO TABS: 250.0000 mg | ORAL_TABLET | Freq: Every day | ORAL | Status: DC | PRN

## 2021-08-09 MED ORDER — AMIODARONE HCL 200 MG PO TABS
400.0000 mg | ORAL_TABLET | Freq: Every day | ORAL | Status: DC
  Administered 2021-08-09 – 2021-08-11 (×3): 400 mg via ORAL
  Filled 2021-08-09 (×3): qty 2

## 2021-08-09 MED ORDER — IOHEXOL 300 MG/ML  SOLN
100.0000 mL | Freq: Once | INTRAMUSCULAR | Status: AC | PRN
  Administered 2021-08-09: 75 mL via INTRAVENOUS

## 2021-08-09 MED ORDER — LEVOTHYROXINE SODIUM 50 MCG PO TABS: 50.0000 ug | ORAL_TABLET | ORAL | Status: DC

## 2021-08-09 MED ORDER — NICOTINE 21 MG/24HR TD PT24
21.0000 mg | MEDICATED_PATCH | Freq: Every day | TRANSDERMAL | Status: DC
  Administered 2021-08-09 – 2021-08-11 (×3): 21 mg via TRANSDERMAL
  Filled 2021-08-09 (×3): qty 1

## 2021-08-09 MED ORDER — ALBUTEROL SULFATE (2.5 MG/3ML) 0.083% IN NEBU: 2.5000 mg | INHALATION_SOLUTION | Freq: Three times a day (TID) | RESPIRATORY_TRACT | Status: DC | PRN

## 2021-08-09 NOTE — ED Notes (Signed)
This RN has called the lab to add on the procalcitonin. Lab tech states they do not have an order for a procalcitonin that is due now. They state they only have one due at 5am. This RN placed another order for a procalcitonin to be due now but the lab interface [facility] system for some reason discontinued the order. The lab tech states she did not discontinue it. She states she will run the 5am procalcitonin now and a new order for procalcitonin will need to be placed for 5am.

## 2021-08-09 NOTE — ED Notes (Signed)
This RN spoke with Dr. Hurley Cisco who states to have a procalcitonin drawn now for a one time order and he stated he does not need another procalcitonin drawn at 5am. This RN called back down to the lab and they stated they are running the procalcitonin now.

## 2021-08-09 NOTE — Assessment & Plan Note (Signed)
Suspect some degree of worse than baseline CHF (worse LE edema) secondary to A.Fib with RVR today. 1. A.Fib rate control: see above

## 2021-08-09 NOTE — Care Plan (Addendum)
This 76 years old female with PMH significant for CAD, COPD, prior history of NSCLC stage Ia back in 2012, underwent left upper lobectomy in July 2012, reports cancer has been in remission until a new spot detected in November 2022, Patient underwent radiation therapy of left lung in December 2022.  She underwent bronchoscopy for left hilar mass with biopsy by Dr. Koleen Nimrod on 6/9 which demonstrated total occlusion of LLL bronchus,  node biopsies were negative but brushings were positive for NSCLC.  Patient presented to the [facility] facility on 6/10 with increased shortness of breath and hypoxia.  She was found to have opacification of left hemithorax on chest x-ray. Chest tube was placed with some bloody fluid output but no malignant cells detected.  There was no improvement in aeration.  Patient was then transferred to [facility]. She presented in the ED with c/o: palpitations, tachycardia,  heart rate in 160s found to be SVT at that time , this resolved with metoprolol.  Chest x-ray demonstrated opacification of left hemithorax.  Pulmonology was consulted and was less impressed given the history. Patient was discharged home 6/20. Patient is found to have paroxysmal atrial fibrillation, started on amiodarone gtt., converted to normal sinus rhythm.  Heart rate is controlled.  Cardiology is consulted.  BNP 360, troponin 40> 52, lactic acid 1.0, procalcitonin 0.12. Patient was seen and examined at bedside,  lying comfortably, denies any chest pain or shortness of breath.  Pulmonology is consulted.  UA was positive, started on IV ceftriaxone.

## 2021-08-09 NOTE — ED Notes (Signed)
Patient transported to CT 

## 2021-08-09 NOTE — ED Notes (Signed)
Pt has returned from CT.  

## 2021-08-09 NOTE — Telephone Encounter (Addendum)
Left a message regarding radiation appointments being canceled today and tomorrow due to her hospitalization. Also left a message for her husband.

## 2021-08-09 NOTE — Assessment & Plan Note (Signed)
Holding home BP meds, BP running low in ED following meds used to treat A.Fib RVR.

## 2021-08-09 NOTE — ED Notes (Signed)
ED TO INPATIENT HANDOFF REPORT  S Name/Age/Gender [name] 76 y.o. female Room/Bed: 016C/016C  Code Status   Code Status: Full Code  Home/SNF/Other Home Patient oriented to: self, place, time, and situation Is this baseline? Yes   Triage Complete: Triage complete  Chief Complaint Paroxysmal atrial fibrillation with RVR ([place]) [I48.0]  Triage Note PER EMS: pt is from home, pt began to feel some heart palpations around 6:30pm tonight associated with hot flashes and she felt clammy. HR was 160 sinus tach. 400cc NS given en route by EMS.  Pt was seen yesterday at [facility] ER for the same issue, was give Metoprolol. BP-108/67  She has stage lung cancer and is scheduled to start Chemo and radiation next week. 3.5L O2 [place] at baseline.   Allergies Allergies  Allergen Reactions   Codeine Other (See Comments)    Unknown reaction Patient avoids this medication    Level of Care/Admitting Diagnosis ED Disposition     ED Disposition  Admit   Condition  --   [place]: [place] [[id number]]  Level of Care: Progressive [102]  Admit to Progressive based on following criteria: CARDIOVASCULAR & THORACIC of moderate stability with acute coronary syndrome symptoms/low risk myocardial infarction/hypertensive urgency/arrhythmias/heart failure potentially compromising stability and stable post cardiovascular intervention patients.  May place patient in observation at [facility] or [place] if equivalent level of care is available:: Yes  Covid Evaluation: Asymptomatic - no recent exposure (last 10 days) testing not required  Diagnosis: Paroxysmal atrial fibrillation with RVR [facility]) [[id number]]  Admitting Physician: Etta Quill [[id number]]  Attending Physician: Etta Quill [phone]          B Medical/Surgery History Past Medical History:  Diagnosis Date   CAD (coronary artery disease)    Cancer ([place])    COPD (chronic obstructive  pulmonary disease) ([place])    per DM note, pt denies   Dyslipidemia    GERD (gastroesophageal reflux disease)    History of radiation therapy    Left lung- 01/16/21-01/30/21- Dr. Gery Pray   HTN (hypertension)    Hypothyroidism    Myocardial infarct [facility])    Non-small cell lung cancer ([place]) 08/19/2010   Stage IA, status post left upper lobectomy July 2012   Tobacco abuse    Past Surgical History:  Procedure Laterality Date   CARDIAC CATHETERIZATION  07/06/2010   see CABG report - pt sent to OR   CARDIOVASCULAR STRESS TEST  09/11/2010   R/S MV - normal perfusion in all regions, EF 46%, no scintigraphic evidence of inducible myocardial ischemia; global LV systolic function mildly reduced; no significant wall motion abnormalities noted; Exercise capacity 7 METS; EKG negative for ischemia; low risk study, no signifcant change from previous study 08/2003   CORONARY ARTERY BYPASS GRAFT  07/11/2010   LIMA to LAD; SVG to 2nd branch OM; SVG to posterior descending artery   LEFT VATS  09/17/2010   TEE WITHOUT CARDIOVERSION  07/06/2010   during emergent CABG surgery; 2-3+ mitral regurgitation   VIDEO BRONCHOSCOPY N/A 07/27/2021   Procedure: VIDEO BRONCHOSCOPY;  Surgeon: Melrose Nakayama, MD;  Location: [place];  Service: Thoracic;  Laterality: N/A;   VIDEO BRONCHOSCOPY WITH ENDOBRONCHIAL ULTRASOUND N/A 07/27/2021   Procedure: VIDEO BRONCHOSCOPY WITH ENDOBRONCHIAL ULTRASOUND;  Surgeon: Melrose Nakayama, MD;  Location: [facility] OR;  Service: Thoracic;  Laterality: N/A;     A IV Location/Drains/Wounds Patient Lines/Drains/Airways Status     Active Line/Drains/Airways  Name Placement date Placement time Site Days   Peripheral IV 08/08/21 18 G Left Antecubital 08/08/21  2133  Antecubital  1   Peripheral IV 08/08/21 20 G Posterior;Right Wrist 08/08/21  2334  Wrist  1            Intake/Output Last 24 hours  Intake/Output Summary (Last 24 hours) at 08/09/2021 0736 Last data filed at  08/08/2021 2310 Gross per 24 hour  Intake 500 ml  Output --  Net 500 ml    Labs/Imaging Results for orders placed or performed during the hospital encounter of 08/08/21 (from the past 48 hour(s))  CBC with Differential     Status: Abnormal   Collection Time: 08/08/21  9:54 PM  Result Value Ref Range   WBC 13.3 (H) 4.0 - 10.5 K/uL   RBC 4.15 3.87 - 5.11 MIL/uL   Hemoglobin 10.8 (L) 12.0 - 15.0 g/dL   HCT 37.3 36.0 - 46.0 %   MCV 89.9 80.0 - 100.0 fL   MCH 26.0 26.0 - 34.0 pg   MCHC 29.0 (L) 30.0 - 36.0 g/dL   RDW 15.8 (H) 11.5 - 15.5 %   Platelets 281 150 - 400 K/uL   nRBC 0.0 0.0 - 0.2 %   Neutrophils Relative % 78 %   Neutro Abs 10.3 (H) 1.7 - 7.7 K/uL   Lymphocytes Relative 7 %   Lymphs Abs 1.0 0.7 - 4.0 K/uL   Monocytes Relative 14 %   Monocytes Absolute 1.9 (H) 0.1 - 1.0 K/uL   Eosinophils Relative 0 %   Eosinophils Absolute 0.0 0.0 - 0.5 K/uL   Basophils Relative 0 %   Basophils Absolute 0.0 0.0 - 0.1 K/uL   Immature Granulocytes 1 %   Abs Immature Granulocytes 0.08 (H) 0.00 - 0.07 K/uL    Comment: Performed at [facility], [address] [place], [place] [address]  Comprehensive metabolic panel     Status: Abnormal   Collection Time: 08/08/21  9:54 PM  Result Value Ref Range   Sodium 141 135 - 145 mmol/L   Potassium 3.3 (L) 3.5 - 5.1 mmol/L   Chloride 94 (L) 98 - 111 mmol/L   CO2 36 (H) 22 - 32 mmol/L   Glucose, Bld 156 (H) 70 - 99 mg/dL    Comment: Glucose reference range applies only to samples taken after fasting for at least 8 hours.   BUN 8 8 - 23 mg/dL   Creatinine, Ser 0.79 0.44 - 1.00 mg/dL   Calcium 8.7 (L) 8.9 - 10.3 mg/dL   Total Protein 5.8 (L) 6.5 - 8.1 g/dL   Albumin 2.1 (L) 3.5 - 5.0 g/dL   AST 43 (H) 15 - 41 U/L   ALT 54 (H) 0 - 44 U/L   Alkaline Phosphatase 70 38 - 126 U/L   Total Bilirubin 0.4 0.3 - 1.2 mg/dL   GFR, Estimated >60 >60 mL/min    Comment: (NOTE) Calculated using the CKD-EPI Creatinine Equation (2021)    Anion gap 11  5 - 15    Comment: Performed at [facility], [place] [address], [place], [place] [address]  Troponin I (High Sensitivity)     Status: Abnormal   Collection Time: 08/08/21  9:54 PM  Result Value Ref Range   Troponin I (High Sensitivity) 40 (H) <18 ng/L    Comment: (NOTE) Elevated high sensitivity troponin I (hsTnI) values and significant  changes across serial measurements may suggest ACS but many other  chronic  and acute conditions are known to elevate hsTnI results.  Refer to the "Links" section for chest pain algorithms and additional  guidance. Performed at [facility], [place] [address], [place], [place] [address]   Brain natriuretic peptide     Status: Abnormal   Collection Time: 08/08/21  9:54 PM  Result Value Ref Range   B Natriuretic Peptide 360.1 (H) 0.0 - 100.0 pg/mL    Comment: Performed at [facility] [address] [place], [place] [address]  Magnesium     Status: None   Collection Time: 08/08/21  9:54 PM  Result Value Ref Range   Magnesium 2.2 1.7 - 2.4 mg/dL    Comment: Performed at [facility], [place] [address], [place], [place] [address]  TSH     Status: None   Collection Time: 08/08/21  9:54 PM  Result Value Ref Range   TSH 2.100 0.350 - 4.500 uIU/mL    Comment: Performed by a 3rd Generation assay with a functional sensitivity of <=0.01 uIU/mL. Performed at [facility], [place] [address], [place], [place] [address]   Blood culture (routine x 2)     Status: None (Preliminary result)   Collection Time: 08/08/21 11:10 PM   Specimen: BLOOD RIGHT HAND  Result Value Ref Range   Specimen Description BLOOD RIGHT HAND    Special Requests      BOTTLES DRAWN AEROBIC AND ANAEROBIC Blood Culture adequate volume   Culture      NO GROWTH < 12 HOURS Performed at [facility], [place] [address], [place], [place] [address]    Report Status PENDING   Lactic acid, plasma     Status: None   Collection Time: 08/08/21 11:10 PM  Result Value Ref  Range   Lactic Acid, Venous 1.0 0.5 - 1.9 mmol/L    Comment: Performed at [facility] [address], [place], [place] [address]  CBG monitoring, ED     Status: Abnormal   Collection Time: 08/08/21 11:24 PM  Result Value Ref Range   Glucose-Capillary 176 (H) 70 - 99 mg/dL    Comment: Glucose reference range applies only to samples taken after fasting for at least 8 hours.  Blood culture (routine x 2)     Status: None (Preliminary result)   Collection Time: 08/08/21 11:32 PM   Specimen: BLOOD  Result Value Ref Range   Specimen Description BLOOD BLOOD RIGHT WRIST    Special Requests      BOTTLES DRAWN AEROBIC AND ANAEROBIC Blood Culture adequate volume   Culture      NO GROWTH < 12 HOURS Performed at [facility], [place] [address] [place], [place] [address]    Report Status PENDING   I-Stat venous blood gas, [facility] ED only)     Status: Abnormal   Collection Time: 08/08/21 11:33 PM  Result Value Ref Range   pH, Ven 7.471 (H) 7.25 - 7.43   pCO2, Ven 52.7 44 - 60 mmHg   pO2, Ven 150 (H) 32 - 45 mmHg   Bicarbonate 38.5 (H) 20.0 - 28.0 mmol/L   TCO2 40 (H) 22 - 32 mmol/L   O2 Saturation 99 %   Acid-Base Excess 13.0 (H) 0.0 - 2.0 mmol/L   Sodium 138 135 - 145 mmol/L   Potassium 3.3 (L) 3.5 - 5.1 mmol/L   Calcium, Ion 1.05 (L) 1.15 - 1.40 mmol/L   HCT 35.0 (L) 36.0 - 46.0 %  Hemoglobin 11.9 (L) 12.0 - 15.0 g/dL   Sample type VENOUS   Troponin I (High Sensitivity)     Status: Abnormal   Collection Time: 08/08/21 11:37 PM  Result Value Ref Range   Troponin I (High Sensitivity) 52 (H) <18 ng/L    Comment: (NOTE) Elevated high sensitivity troponin I (hsTnI) values and significant  changes across serial measurements may suggest ACS but many other  chronic and acute conditions are known to elevate hsTnI results.  Refer to the "Links" section for chest pain algorithms and additional  guidance. Performed at [facility], [place] [address], [place], [place] [address]    Procalcitonin     Status: None   Collection Time: 08/08/21 11:37 PM  Result Value Ref Range   Procalcitonin <0.10 ng/mL    Comment:        Interpretation: PCT (Procalcitonin) <= 0.5 ng/mL: Systemic infection (sepsis) is not likely. Local bacterial infection is possible. (NOTE)       Sepsis PCT Algorithm           Lower Respiratory Tract                                      Infection PCT Algorithm    ----------------------------     ----------------------------         PCT < 0.25 ng/mL                PCT < 0.10 ng/mL          Strongly encourage             Strongly discourage   discontinuation of antibiotics    initiation of antibiotics    ----------------------------     -----------------------------       PCT 0.25 - 0.50 ng/mL            PCT 0.10 - 0.25 ng/mL               OR       >80% decrease in PCT            Discourage initiation of                                            antibiotics      Encourage discontinuation           of antibiotics    ----------------------------     -----------------------------         PCT >= 0.50 ng/mL              PCT 0.26 - 0.50 ng/mL               AND        <80% decrease in PCT             Encourage initiation of                                             antibiotics       Encourage continuation           of antibiotics    ----------------------------     -----------------------------  PCT >= 0.50 ng/mL                  PCT > 0.50 ng/mL               AND         increase in PCT                  Strongly encourage                                      initiation of antibiotics    Strongly encourage escalation           of antibiotics                                     -----------------------------                                           PCT <= 0.25 ng/mL                                                 OR                                        > 80% decrease in PCT                                      Discontinue / Do not  initiate                                             antibiotics  Performed at [facility], [address], [place], [place] [address]   Urinalysis, Routine w reflex microscopic Urine, Clean Catch     Status: Abnormal   Collection Time: 08/09/21 12:07 AM  Result Value Ref Range   Color, Urine YELLOW YELLOW   APPearance HAZY (A) CLEAR   Specific Gravity, Urine 1.006 1.005 - 1.030   pH 7.0 5.0 - 8.0   Glucose, UA NEGATIVE NEGATIVE mg/dL   Hgb urine dipstick NEGATIVE NEGATIVE   Bilirubin Urine NEGATIVE NEGATIVE   Ketones, ur NEGATIVE NEGATIVE mg/dL   Protein, ur 30 (A) NEGATIVE mg/dL   Nitrite NEGATIVE NEGATIVE   Leukocytes,Ua MODERATE (A) NEGATIVE   RBC / HPF 0-5 0 - 5 RBC/hpf   WBC, UA 21-50 0 - 5 WBC/hpf   Bacteria, UA MANY (A) NONE SEEN   Squamous Epithelial / LPF 0-5 0 - 5    Comment: Performed at [facility], [place] [address] [place] [address]  CBC     Status: Abnormal   Collection Time: 08/09/21  5:09 AM  Result Value Ref Range   WBC 10.5 4.0 - 10.5 K/uL   RBC 4.17 3.87 - 5.11 MIL/uL  Hemoglobin 10.8 (L) 12.0 - 15.0 g/dL   HCT 38.0 36.0 - 46.0 %   MCV 91.1 80.0 - 100.0 fL   MCH 25.9 (L) 26.0 - 34.0 pg   MCHC 28.4 (L) 30.0 - 36.0 g/dL   RDW 15.9 (H) 11.5 - 15.5 %   Platelets 278 150 - 400 K/uL   nRBC 0.0 0.0 - 0.2 %    Comment: Performed at [facility] [address], [place], [place] [address]  Basic metabolic panel     Status: Abnormal   Collection Time: 08/09/21  5:09 AM  Result Value Ref Range   Sodium 142 135 - 145 mmol/L   Potassium 3.5 3.5 - 5.1 mmol/L   Chloride 97 (L) 98 - 111 mmol/L   CO2 37 (H) 22 - 32 mmol/L   Glucose, Bld 107 (H) 70 - 99 mg/dL    Comment: Glucose reference range applies only to samples taken after fasting for at least 8 hours.   BUN 8 8 - 23 mg/dL   Creatinine, Ser 0.87 0.44 - 1.00 mg/dL   Calcium 8.6 (L) 8.9 - 10.3 mg/dL   GFR, Estimated >60 >60 mL/min    Comment: (NOTE) Calculated using the CKD-EPI  Creatinine Equation (2021)    Anion gap 8 5 - 15    Comment: Performed at [facility] [address] [place], [place] [address]   CT Head W or Wo Contrast  Result Date: 08/09/2021 CLINICAL DATA:  Non-small cell lung cancer, evaluate for brain Mets prior to anticoagulation EXAM: CT HEAD WITHOUT AND WITH CONTRAST TECHNIQUE: Contiguous axial images were obtained from the base of the skull through the vertex without and with intravenous contrast. RADIATION DOSE REDUCTION: This exam was performed according to the departmental dose-optimization program which includes automated exposure control, adjustment of the mA and/or kV according to patient size and/or use of iterative reconstruction technique. CONTRAST:  11mL OMNIPAQUE IOHEXOL 300 MG/ML  SOLN COMPARISON:  None Available. FINDINGS: Brain: 3 mm focus of enhancement in the pons (series 11, image 27), which is nonspecific and could represent an enhancing lesion versus a vascular lesion such as a capillary telangiectasia. No additional foci of enhancement are seen. No evidence of acute infarction, hemorrhage, cerebral edema, mass effect, or midline shift. No hydrocephalus or extra-axial fluid collection. Vascular: No hyperdense vessel. Normal enhancement of the circle-of-Willis. Skull: Hyperostosis frontalis. Negative for fracture or focal lesion. Sinuses/Orbits: No acute finding. Other: The mastoid air cells are well aerated. IMPRESSION: 3 mm focus of enhancement in the pons, which is nonspecific and could represent an enhancing lesion versus a vascular lesion such as a capillary telangiectasia. An MRI of the brain with and without contrast may help distinguish the etiology, and is far more sensitive for small metastatic lesions. Electronically Signed   By: Merilyn Baba M.D.   On: 08/09/2021 02:05   CT CHEST W CONTRAST  Result Date: 08/07/2021 CLINICAL DATA:  Pleural effusion, malignancy suspected EXAM: CT CHEST WITH CONTRAST TECHNIQUE:  Multidetector CT imaging of the chest was performed during intravenous contrast administration. RADIATION DOSE REDUCTION: This exam was performed according to the departmental dose-optimization program which includes automated exposure control, adjustment of the mA and/or kV according to patient size and/or use of iterative reconstruction technique. CONTRAST:  20mL OMNIPAQUE IOHEXOL 300 MG/ML  SOLN COMPARISON:  Pet CT 06/08/2021, chest x-ray 07/25/2021, chest x-ray 08/07/2021 FINDINGS: Cardiovascular: Normal heart size. No significant pericardial effusion. The thoracic aorta is normal in caliber. No  atherosclerotic plaque of the thoracic aorta. Four-vessel coronary artery calcifications status post coronary artery bypass graft. The main pulmonary artery is normal in caliber. No central or segmental pulmonary embolus. Mediastinum/Nodes: Known left hilar mass lesion poorly visualized measuring up to at least 4 cm. No right hilar lymphadenopathy. No enlarged mediastinal or axillary lymph nodes. Thyroid gland, trachea, and esophagus demonstrate no significant findings. Lungs/Pleura: Interval complete hypodense heterogeneous opacification of the left lung consistent. Interval development of a at least small volume left pleural effusion. The right lung is clear. No right pleural effusion. No pneumothorax. Upper Abdomen: Subcentimeter splenule (2:124). No acute abnormality. Musculoskeletal: No chest wall abnormality. No suspicious lytic or blastic osseous lesions. No acute displaced fracture. Multilevel degenerative changes of the spine. Intact sternotomy wires. IMPRESSION: Interval development of postobstructive infection/collapse of the entire left lung in the setting of known left hilar mass lesion and interval development of at least small volume left pleural effusion. Electronically Signed   By: Iven Finn M.D.   On: 08/07/2021 17:38   DG Chest Port 1 View  Result Date: 08/07/2021 CLINICAL DATA:  Shortness of  breath EXAM: PORTABLE CHEST 1 VIEW COMPARISON:  Chest radiograph dated July 26, 2021 FINDINGS: Evaluation of cardiomediastinal silhouette is limited due to complete opacification of the left hemithorax. Right lung is clear. Sternotomy wires representing prior coronary artery bypass grafting unchanged. IMPRESSION: Complete opacification of the left hemithorax, which may represent atelectasis or effusion. Right lung is clear. Electronically Signed   By: Keane Police D.O.   On: 08/07/2021 14:22    Pending Labs Unresulted Labs (From admission, onward)     Start     Ordered   08/08/21 2302  MRSA Next Gen by PCR, Nasal  (MRSA Screening)  Once,   URGENT        08/08/21 2301            Vitals/Pain Today's Vitals   08/09/21 0630 08/09/21 0645 08/09/21 0700 08/09/21 0715  BP: (!) 103/51 (!) 102/51 111/60 (!) 115/50  Pulse: 66 64 70 66  Resp: 19 (!) 23 (!) 23 19  Temp:      TempSrc:      SpO2: 100% 97% 100% 95%  PainSc:        Isolation Precautions No active isolations  Medications Medications  adenosine (ADENOCARD) 6 MG/2ML injection (12 mg Intravenous Given 08/08/21 2204)  amiodarone (NEXTERONE) 1.8 mg/mL load via infusion 150 mg (150 mg Intravenous Bolus from Bag 08/08/21 2310)    Followed by  amiodarone (NEXTERONE PREMIX) 360-4.14 MG/200ML-% (1.8 mg/mL) IV infusion (60 mg/hr Intravenous New Bag/Given 08/09/21 0245)    Followed by  amiodarone (NEXTERONE PREMIX) 360-4.14 MG/200ML-% (1.8 mg/mL) IV infusion (has no administration in time range)  amiodarone (NEXTERONE) 150-4.21 MG/100ML-% bolus (  Canceled Entry 08/09/21 0110)  acetaminophen (TYLENOL) tablet 650 mg (has no administration in time range)  ondansetron (ZOFRAN) injection 4 mg (has no administration in time range)  enoxaparin (LOVENOX) injection 40 mg (has no administration in time range)  albuterol (PROVENTIL) (2.5 MG/3ML) 0.083% nebulizer solution 2.5 mg (has no administration in time range)  fluconazole (DIFLUCAN) tablet 100  mg (has no administration in time range)  levothyroxine (SYNTHROID) tablet 50 mcg (has no administration in time range)  rosuvastatin (CRESTOR) tablet 40 mg (has no administration in time range)  naproxen (NAPROSYN) tablet 250 mg (has no administration in time range)  chlorpheniramine-HYDROcodone 10-8 MG/5ML suspension 5 mL (has no administration in time range)  levothyroxine (SYNTHROID) tablet 62.5  mcg (62.5 mcg Oral Given 08/09/21 0656)  adenosine (ADENOCARD) 6 MG/2ML injection 6 mg (6 mg Intravenous Given 08/08/21 2158)  metoprolol tartrate (LOPRESSOR) injection 5 mg (5 mg Intravenous Given 08/08/21 2207)  metoprolol tartrate (LOPRESSOR) injection 5 mg (5 mg Intravenous Given 08/08/21 2212)  sodium chloride 0.9 % bolus 500 mL (0 mLs Intravenous Stopped 08/08/21 2310)  iohexol (OMNIPAQUE) 300 MG/ML solution 100 mL (75 mLs Intravenous Contrast Given 08/09/21 0139)    Mobility walks Low fall risk   Focused Assessments Cardiac Assessment Handoff:  Cardiac Rhythm: Normal sinus rhythm Lab Results  Component Value Date   CKTOTAL 98 07/06/2010   CKMB 4.1 (H) 07/06/2010   TROPONINI ([place]) 07/06/2010    1.49        Due to the release kinetics of cTnI, a negative result within the first hours of the onset of symptoms does not rule out myocardial infarction with certainty. If myocardial infarction is still suspected, repeat the test at appropriate intervals. **Please note change in reference range.** CRITICAL RESULT CALLED TO, READ BACK BY AND VERIFIED WITH: LORI DEATON,RN AT 1237 07/06/10 BY ZPERRY.   Lab Results  Component Value Date   DDIMER 1.76 (H) 06/29/2020   Does the Patient currently have chest pain? No    R Recommendations: See Admitting Provider Note

## 2021-08-09 NOTE — Assessment & Plan Note (Addendum)
Opacification of L hemithorax is likely due to history of left upper lobectomy in 2012 and complete occlusion of left lower lobe bronchus by NSCLC as seen on bronchoscopy on 6/9. Superimposed post-obstructive PNA is possible, but seems a bit less likely given lack of true fever, WBC only 13k. 1. Holding off on ABx for the moment. 2. Monitor for: 1. Fever 2. Elevation of WBC with CBC tomorrow 3. Persistence of diaphoresis symptoms despite improvement in A.Fib (pt just converted out of it on amiodarone drip). 1. I note that her BP has already dramatically improved, now 815 systolic on last check. 2. I also would note that Pt reports resolution of diaphoresis symptoms (all symptoms) at present time now that she is back in NSR 3. Lactate 1.0 4. Will check procalcitonin 5. Check UA for UTI (does have dysuria symptoms). 6. Per Dr. Roxan Hockey: not candidate for surgery when I spoke to him on 6/10, and he didn't feel endo-bronchial stent would be helpful either.

## 2021-08-09 NOTE — Progress Notes (Signed)
   Patient seen earlier this AM in consultation. Converted back to NSR Plan to stop amiodarone.  For now will start PO but we will decide on whether to continue this at the time of discharge.

## 2021-08-10 ENCOUNTER — Inpatient Hospital Stay (HOSPITAL_COMMUNITY): Admission: RE | Admit: 2021-08-10 | Payer: Medicare HMO | Source: Ambulatory Visit

## 2021-08-10 ENCOUNTER — Ambulatory Visit (HOSPITAL_COMMUNITY): Payer: Medicare HMO

## 2021-08-10 ENCOUNTER — Telehealth: Payer: Self-pay | Admitting: Internal Medicine

## 2021-08-10 ENCOUNTER — Other Ambulatory Visit: Payer: Medicare HMO

## 2021-08-10 ENCOUNTER — Inpatient Hospital Stay (HOSPITAL_COMMUNITY): Payer: Medicare HMO

## 2021-08-10 ENCOUNTER — Ambulatory Visit: Payer: Medicare HMO

## 2021-08-10 DIAGNOSIS — I471 Supraventricular tachycardia: Secondary | ICD-10-CM | POA: Diagnosis not present

## 2021-08-10 DIAGNOSIS — I110 Hypertensive heart disease with heart failure: Secondary | ICD-10-CM | POA: Diagnosis not present

## 2021-08-10 DIAGNOSIS — Z951 Presence of aortocoronary bypass graft: Secondary | ICD-10-CM | POA: Diagnosis not present

## 2021-08-10 DIAGNOSIS — E785 Hyperlipidemia, unspecified: Secondary | ICD-10-CM | POA: Diagnosis not present

## 2021-08-10 DIAGNOSIS — J9612 Chronic respiratory failure with hypercapnia: Secondary | ICD-10-CM | POA: Diagnosis not present

## 2021-08-10 DIAGNOSIS — C3492 Malignant neoplasm of unspecified part of left bronchus or lung: Secondary | ICD-10-CM | POA: Diagnosis not present

## 2021-08-10 DIAGNOSIS — R0602 Shortness of breath: Secondary | ICD-10-CM | POA: Diagnosis not present

## 2021-08-10 DIAGNOSIS — I5033 Acute on chronic diastolic (congestive) heart failure: Secondary | ICD-10-CM | POA: Diagnosis not present

## 2021-08-10 DIAGNOSIS — Z79890 Hormone replacement therapy (postmenopausal): Secondary | ICD-10-CM | POA: Diagnosis not present

## 2021-08-10 DIAGNOSIS — J9811 Atelectasis: Secondary | ICD-10-CM | POA: Diagnosis not present

## 2021-08-10 DIAGNOSIS — I252 Old myocardial infarction: Secondary | ICD-10-CM | POA: Diagnosis not present

## 2021-08-10 DIAGNOSIS — I4892 Unspecified atrial flutter: Secondary | ICD-10-CM | POA: Diagnosis not present

## 2021-08-10 DIAGNOSIS — F17210 Nicotine dependence, cigarettes, uncomplicated: Secondary | ICD-10-CM | POA: Diagnosis not present

## 2021-08-10 DIAGNOSIS — Z9861 Coronary angioplasty status: Secondary | ICD-10-CM | POA: Diagnosis not present

## 2021-08-10 DIAGNOSIS — C3432 Malignant neoplasm of lower lobe, left bronchus or lung: Secondary | ICD-10-CM | POA: Diagnosis not present

## 2021-08-10 DIAGNOSIS — I480 Paroxysmal atrial fibrillation: Secondary | ICD-10-CM | POA: Diagnosis not present

## 2021-08-10 DIAGNOSIS — I255 Ischemic cardiomyopathy: Secondary | ICD-10-CM | POA: Diagnosis not present

## 2021-08-10 DIAGNOSIS — K219 Gastro-esophageal reflux disease without esophagitis: Secondary | ICD-10-CM | POA: Diagnosis not present

## 2021-08-10 DIAGNOSIS — Z823 Family history of stroke: Secondary | ICD-10-CM | POA: Diagnosis not present

## 2021-08-10 DIAGNOSIS — J449 Chronic obstructive pulmonary disease, unspecified: Secondary | ICD-10-CM | POA: Diagnosis not present

## 2021-08-10 DIAGNOSIS — I619 Nontraumatic intracerebral hemorrhage, unspecified: Secondary | ICD-10-CM | POA: Diagnosis not present

## 2021-08-10 DIAGNOSIS — E039 Hypothyroidism, unspecified: Secondary | ICD-10-CM | POA: Diagnosis not present

## 2021-08-10 DIAGNOSIS — Z8249 Family history of ischemic heart disease and other diseases of the circulatory system: Secondary | ICD-10-CM | POA: Diagnosis not present

## 2021-08-10 DIAGNOSIS — I2510 Atherosclerotic heart disease of native coronary artery without angina pectoris: Secondary | ICD-10-CM | POA: Diagnosis not present

## 2021-08-10 DIAGNOSIS — J9611 Chronic respiratory failure with hypoxia: Secondary | ICD-10-CM | POA: Diagnosis not present

## 2021-08-10 DIAGNOSIS — N390 Urinary tract infection, site not specified: Secondary | ICD-10-CM | POA: Diagnosis not present

## 2021-08-10 DIAGNOSIS — E876 Hypokalemia: Secondary | ICD-10-CM | POA: Diagnosis not present

## 2021-08-10 DIAGNOSIS — I10 Essential (primary) hypertension: Secondary | ICD-10-CM | POA: Diagnosis not present

## 2021-08-10 LAB — TROPONIN I (HIGH SENSITIVITY): Troponin I (High Sensitivity): 22 ng/L — ABNORMAL HIGH (ref <18)

## 2021-08-10 MED ORDER — METOPROLOL SUCCINATE ER 100 MG PO TB24
100.0000 mg | ORAL_TABLET | Freq: Every day | ORAL | Status: DC
Start: 2021-08-10 — End: 2021-08-11
  Administered 2021-08-10 – 2021-08-11 (×2): 100 mg via ORAL
  Filled 2021-08-10 (×2): qty 1

## 2021-08-10 MED ORDER — GADOBUTROL 1 MMOL/ML IV SOLN
9.5000 mL | Freq: Once | INTRAVENOUS | Status: AC | PRN
Start: 2021-08-10 — End: 2021-08-10
  Administered 2021-08-10: 9.5 mL via INTRAVENOUS

## 2021-08-10 MED ORDER — ROSUVASTATIN CALCIUM 20 MG PO TABS
40.0000 mg | ORAL_TABLET | Freq: Every evening | ORAL | Status: DC
  Administered 2021-08-10: 40 mg via ORAL
  Filled 2021-08-10: qty 2

## 2021-08-10 MED FILL — Dexamethasone Sodium Phosphate Inj 100 MG/10ML: INTRAMUSCULAR | Qty: 1 | Status: AC

## 2021-08-10 NOTE — Progress Notes (Addendum)
Progress Note  Patient Name: [name] Date of Encounter: 08/10/2021  Primary Cardiologist: Nicki Guadalajara, MD  Subjective   Pt feels well this morning without complaint. No CP, SOB, palpitations. Maintaining NSR. Family at bedside.  Inpatient Medications    Scheduled Meds:  amiodarone  400 mg Oral Daily   enoxaparin (LOVENOX) injection  40 mg Subcutaneous Q24H   fluconazole  100 mg Oral Daily   [START ON 08/12/2021] levothyroxine  50 mcg Oral Once per day on Sun   levothyroxine  62.5 mcg Oral Once per day on Mon Tue Wed Thu Fri Sat   nicotine  21 mg Transdermal Daily   rosuvastatin  40 mg Oral QPM   Continuous Infusions:  cefTRIAXone (ROCEPHIN)  IV 1 g (08/09/21 1111)   PRN Meds: acetaminophen, adenosine, albuterol, chlorpheniramine-HYDROcodone, naproxen, ondansetron (ZOFRAN) IV   Vital Signs    Vitals:   08/09/21 2055 08/10/21 0037 08/10/21 0340 08/10/21 0728  BP: 113/64 (!) 100/50 (!) 106/59 (!) 115/56  Pulse: 81 84 94 84  Resp: 19 17 18 18   Temp: 99 F (37.2 C) 98.4 F (36.9 C) 98.8 F (37.1 C) 98.3 F (36.8 C)  TempSrc: Oral Oral Oral Oral  SpO2: 100%  92% 99%  Weight:   95.4 kg     Intake/Output Summary (Last 24 hours) at 08/10/2021 0947 Last data filed at 08/10/2021 0341 Gross per 24 hour  Intake --  Output 450 ml  Net -450 ml      08/10/2021    3:40 AM 08/06/2021    7:55 AM 07/27/2021    5:42 AM  Last 3 Weights  Weight (lbs) 210 lb 6.4 oz 208 lb 8 oz 212 lb  Weight (kg) 95.437 kg 94.575 kg 96.163 kg     Telemetry    NSR - Personally Reviewed  Physical Exam   GEN: No acute distress.  HEENT: Normocephalic, atraumatic, sclera non-icteric. Neck: No JVD or bruits. Cardiac: RRR no murmurs, rubs, or gallops.  Respiratory: Absent BS L psterior hemithorax, clear on R. Breathing is unlabored. GI: Soft, nontender, non-distended, BS +x 4. MS: no deformity. Extremities: No clubbing or cyanosis. No edema. Distal pedal pulses are 2+ and equal  bilaterally. Neuro:  AAOx3. Follows commands. Psych:  Responds to questions appropriately with a normal affect.  Labs    High Sensitivity Troponin:   Recent Labs  Lab 08/08/21 2154 08/08/21 2337 08/10/21 0802  TROPONINIHS 40* 52* 22*      Cardiac EnzymesNo results for input(s): "TROPONINI" in the last 168 hours. No results for input(s): "TROPIPOC" in the last 168 hours.   Chemistry Recent Labs  Lab 08/06/21 0744 08/07/21 1358 08/08/21 2154 08/08/21 2333 08/09/21 0509  NA 139 141 141 138 142  K 3.7 3.4* 3.3* 3.3* 3.5  CL 92* 92* 94*  --  97*  CO2 43* 38* 36*  --  37*  GLUCOSE 123* 161* 156*  --  107*  BUN 13 13 8   --  8  CREATININE 0.80 0.92 0.79  --  0.87  CALCIUM 9.3 8.9 8.7*  --  8.6*  PROT 6.3* 7.2 5.8*  --   --   ALBUMIN 3.0* 2.8* 2.1*  --   --   AST 19 32 43*  --   --   ALT 32 47* 54*  --   --   ALKPHOS 71 71 70  --   --   BILITOT 0.4 0.6 0.4  --   --   GFRNONAA >60 >  60 >60  --  >60  ANIONGAP 4* 11 11  --  8     Hematology Recent Labs  Lab 08/07/21 1358 08/08/21 2154 08/08/21 2333 08/09/21 0509  WBC 13.6* 13.3*  --  10.5  RBC 4.75 4.15  --  4.17  HGB 12.6 10.8* 11.9* 10.8*  HCT 43.4 37.3 35.0* 38.0  MCV 91.4 89.9  --  91.1  MCH 26.5 26.0  --  25.9*  MCHC 29.0* 29.0*  --  28.4*  RDW 15.9* 15.8*  --  15.9*  PLT 258 281  --  278    BNP Recent Labs  Lab 08/08/21 2154  BNP 360.1*     DDimer No results for input(s): "DDIMER" in the last 168 hours.   Radiology    CT Head W or Wo Contrast  Result Date: 08/09/2021 CLINICAL DATA:  Non-small cell lung cancer, evaluate for brain Mets prior to anticoagulation EXAM: CT HEAD WITHOUT AND WITH CONTRAST TECHNIQUE: Contiguous axial images were obtained from the base of the skull through the vertex without and with intravenous contrast. RADIATION DOSE REDUCTION: This exam was performed according to the departmental dose-optimization program which includes automated exposure control, adjustment of the mA  and/or kV according to patient size and/or use of iterative reconstruction technique. CONTRAST:  75mL OMNIPAQUE IOHEXOL 300 MG/ML  SOLN COMPARISON:  None Available. FINDINGS: Brain: 3 mm focus of enhancement in the pons (series 11, image 27), which is nonspecific and could represent an enhancing lesion versus a vascular lesion such as a capillary telangiectasia. No additional foci of enhancement are seen. No evidence of acute infarction, hemorrhage, cerebral edema, mass effect, or midline shift. No hydrocephalus or extra-axial fluid collection. Vascular: No hyperdense vessel. Normal enhancement of the circle-of-Willis. Skull: Hyperostosis frontalis. Negative for fracture or focal lesion. Sinuses/Orbits: No acute finding. Other: The mastoid air cells are well aerated. IMPRESSION: 3 mm focus of enhancement in the pons, which is nonspecific and could represent an enhancing lesion versus a vascular lesion such as a capillary telangiectasia. An MRI of the brain with and without contrast may help distinguish the etiology, and is far more sensitive for small metastatic lesions. Electronically Signed   By: Wiliam Ke M.D.   On: 08/09/2021 02:05    Cardiac Studies   2d echo 10/2020  1. Left ventricular ejection fraction, by estimation, is 50 to 55%. Left  ventricular ejection fraction by 3D volume is 54 %. The left ventricle has  low normal function. The left ventricle has no regional wall motion  abnormalities. Left ventricular  diastolic parameters are consistent with Grade I diastolic dysfunction  (impaired relaxation). The average left ventricular global longitudinal  strain is -16.7 %. The global longitudinal strain is mildly decreased.   2. Right ventricular systolic function is normal. The right ventricular  size is normal. Tricuspid regurgitation signal is inadequate for assessing  PA pressure.   3. The mitral valve is normal in structure. Mild mitral valve  regurgitation. No evidence of mitral  stenosis.   4. The aortic valve is tricuspid. Aortic valve regurgitation is not  visualized. No aortic stenosis is present.   5. The inferior vena cava is normal in size with greater than 50%  respiratory variability, suggesting right atrial pressure of 3 mmHg.   Comparison(s): A prior study was performed on 07/27/2019. Similar LVEF with  slight decrease in LV strain.   Patient Profile     76 y.o. female with anterior MI/VF arrest in 1996 or 1997 s/p  PTCA of LAD, CABG in 2012, prior ICM (EF 35% in 2012 prior to CABG, 50-55% in 10/2020), paroxysmal atrial fibrillation dx 2022 in [place] per Dr. Landry Dyke note (pt refutes dx, required transfusion during admission for anemia, not on anticoag PTA), hypertension, hyperlipidemia, tobacco abuse, COPD stage stage IV with chronic respiratory failure on home oxygen, lung CA (remote adenocarcinoma with subsequent more recent dx of NSCLC), anemia, obesity. She was seen in ED afternoon of 08/07/21 with SVT identified by rad-onc in the 170s, resolved with metoprolol. CXR demonstrated opacification of left hemithorax, pulmonology felt this represented complete endobronchial obstruction due to tumor with small volume pleural effusion with loculations -> case was disussed with Dr. Elam Dutch. Delton Coombes with continued plan for OP managemen. Pt returned to ED later that evening with palpitations. This was initially felt to be SVT not amenable to adenosine but seen by cardiology and felt more likely atrial flutter. This was accompanied by fever, chills, leukocytosis. She was treated with IV amiodarone with reversion to NSR and admitted to medicine service.  Assessment & Plan    1. Paroxysmal atrial flutter  - cannot definitively say for sure as this arrhythmia is common in patients over the age of 45, but may have been triggered by ongoing evolution of pulmonary process with interval development of postobstructive infection/collapse of the entire left lung in the setting of  known left hilar mass lesion and interval development of at least small volume left pleural effusion - responded well to amiodarone, will discuss discharge dosing with MD - prior notes from Dr. Tresa Endo mention that she had AF during admission in [place] and was on amiodarone for a period of time as OP then discontinued by primary care for side effects/hair loss; patient refutes ever having this diagnosis and states she had records from [place] -  - regardless, arrhythmia has now been confirmed - will review anticoagulation plan with MD -> consult note referenced full dose lovenox, currently on DVT ppx dosing. CT head done to exclude mets prior to anticoagulation showed "3 mm focus of enhancement in the pons, which is nonspecific and could represent an enhancing lesion versus a vascular lesion such as a capillary telangiectasia. An MRI of the brain with and without contrast may help distinguish the etiology, and is far more sensitive for small metastatic lesions." - ? Consider MRI prior to committing to anticoagulation - CHADSVASC 5 for prior cardiomyopathy, HTN, age, vascular disease, female - TSH OK - restart home metoprolol so as to avoid BB rebound  2. Elevated troponin, known CAD with MI/PCI 1996 or 1997 (pt states 1996), CABG 2012 - troponin is low/flat, no recent anginal symptoms, do not suspect ACS - suspect demand process - ASA on hold while decision is made re: anticoag above - restart home metoprolol this AM - continue statin  3. H/o ischemic cardiomyopathy - remotely had EF 35% prior to CABG, EF 50-55% in 2022 - could consider repeat limited echo  4. Essential HTN - BP controlled  Remainder of medical issues per primary team.  For questions or updates, please contact [facility] Please consult [email or web address] for contact info under Cardiology/STEMI.  Signed, Laurann Montana, PA-C 08/10/2021, 9:47 AM    History and all data above reviewed.  Patient examined.  I agree with the  findings as above.  The patient has had flutter    She has had a couple of episodes now of atrial flutter.  I looked at the EKGs from the ED yesterday and the day  before and these both appear to be flutter. No complaints overnight.  No palpitations.  The patient exam reveals COR:RRR  ,  Lungs: Clear  ,  Abd: Positive bowel sounds, no rebound no guarding, Ext No edema   .  All available labs, radiology testing, previous records reviewed. Agree with documented assessment and plan. Atrial flutter:  Now on amiodarone.  I did review oncology notes.  Looks like she will be getting paclitaxel and carboplatin.  I see no contraindication to this.   I agree with the repeat echo and continue the beta blocker and amiodarone.    Fayrene Fearing Basim Bartnik  12:17 PM  08/10/2021

## 2021-08-10 NOTE — Plan of Care (Signed)
Patient remains on 3L [place], SOB with exertion however quickly recovers.  Problem: Education: Goal: Knowledge of General Education information will improve Description: Including pain rating scale, medication(s)/side effects and non-pharmacologic comfort measures Outcome: Progressing   Problem: Health Behavior/Discharge Planning: Goal: Ability to manage health-related needs will improve Outcome: Progressing   Problem: Clinical Measurements: Goal: Ability to maintain clinical measurements within normal limits will improve Outcome: Progressing Goal: Will remain free from infection Outcome: Progressing Goal: Diagnostic test results will improve Outcome: Progressing Goal: Respiratory complications will improve Outcome: Progressing Goal: Cardiovascular complication will be avoided Outcome: Progressing   Problem: Activity: Goal: Risk for activity intolerance will decrease Outcome: Progressing   Problem: Nutrition: Goal: Adequate nutrition will be maintained Outcome: Progressing   Problem: Coping: Goal: Level of anxiety will decrease Outcome: Progressing   Problem: Elimination: Goal: Will not experience complications related to bowel motility Outcome: Progressing Goal: Will not experience complications related to urinary retention Outcome: Progressing   Problem: Pain Managment: Goal: General experience of comfort will improve Outcome: Progressing   Problem: Safety: Goal: Ability to remain free from injury will improve Outcome: Progressing   Problem: Skin Integrity: Goal: Risk for impaired skin integrity will decrease Outcome: Progressing

## 2021-08-11 DIAGNOSIS — I480 Paroxysmal atrial fibrillation: Secondary | ICD-10-CM | POA: Diagnosis not present

## 2021-08-11 DIAGNOSIS — I10 Essential (primary) hypertension: Secondary | ICD-10-CM | POA: Diagnosis not present

## 2021-08-11 LAB — CBC
HCT: 34.1 % — ABNORMAL LOW (ref 36.0–46.0)
Hemoglobin: 9.9 g/dL — ABNORMAL LOW (ref 12.0–15.0)
MCH: 26.4 pg (ref 26.0–34.0)
MCHC: 29 g/dL — ABNORMAL LOW (ref 30.0–36.0)
MCV: 90.9 fL (ref 80.0–100.0)
Platelets: 322 10*3/uL (ref 150–400)
RBC: 3.75 MIL/uL — ABNORMAL LOW (ref 3.87–5.11)
RDW: 15.9 % — ABNORMAL HIGH (ref 11.5–15.5)
WBC: 10.7 10*3/uL — ABNORMAL HIGH (ref 4.0–10.5)
nRBC: 0 % (ref 0.0–0.2)

## 2021-08-11 LAB — MAGNESIUM: Magnesium: 2.2 mg/dL (ref 1.7–2.4)

## 2021-08-11 LAB — BASIC METABOLIC PANEL
Anion gap: 6 (ref 5–15)
BUN: 7 mg/dL — ABNORMAL LOW (ref 8–23)
CO2: 36 mmol/L — ABNORMAL HIGH (ref 22–32)
Calcium: 8.5 mg/dL — ABNORMAL LOW (ref 8.9–10.3)
Chloride: 98 mmol/L (ref 98–111)
Creatinine, Ser: 0.91 mg/dL (ref 0.44–1.00)
GFR, Estimated: >60 mL/min (ref >60)
Glucose, Bld: 148 mg/dL — ABNORMAL HIGH (ref 70–99)
Potassium: 3.5 mmol/L (ref 3.5–5.1)
Sodium: 140 mmol/L (ref 135–145)

## 2021-08-11 LAB — PHOSPHORUS: Phosphorus: 3.2 mg/dL (ref 2.5–4.6)

## 2021-08-11 MED ORDER — AMIODARONE HCL 200 MG PO TABS: 200.0000 mg | ORAL_TABLET | Freq: Every day | ORAL | Status: DC

## 2021-08-11 MED ORDER — FUROSEMIDE 20 MG PO TABS
20.0000 mg | ORAL_TABLET | Freq: Every day | ORAL | Status: DC
Start: 2021-08-11 — End: 2021-08-11
  Administered 2021-08-11: 20 mg via ORAL
  Filled 2021-08-11: qty 1

## 2021-08-11 MED ORDER — AMIODARONE HCL 200 MG PO TABS: 400.0000 mg | ORAL_TABLET | Freq: Two times a day (BID) | ORAL | Status: DC

## 2021-08-11 MED ORDER — AMIODARONE HCL 400 MG PO TABS: 400.0000 mg | ORAL_TABLET | Freq: Two times a day (BID) | ORAL | 0 refills | Status: DC

## 2021-08-11 MED ORDER — AMIODARONE HCL 200 MG PO TABS: 200.0000 mg | ORAL_TABLET | Freq: Two times a day (BID) | ORAL | 0 refills | Status: DC

## 2021-08-11 MED ORDER — AMIODARONE HCL 200 MG PO TABS: 200.0000 mg | ORAL_TABLET | Freq: Two times a day (BID) | ORAL | Status: DC

## 2021-08-11 MED ORDER — AMIODARONE HCL 200 MG PO TABS: 200.0000 mg | ORAL_TABLET | Freq: Every day | ORAL | 1 refills | Status: DC

## 2021-08-13 ENCOUNTER — Other Ambulatory Visit: Payer: Self-pay

## 2021-08-13 ENCOUNTER — Ambulatory Visit: Payer: Medicare HMO

## 2021-08-13 ENCOUNTER — Ambulatory Visit (HOSPITAL_COMMUNITY): Payer: Medicare HMO

## 2021-08-13 ENCOUNTER — Telehealth: Payer: Self-pay | Admitting: Cardiology

## 2021-08-13 ENCOUNTER — Telehealth: Payer: Self-pay | Admitting: Medical Oncology

## 2021-08-13 ENCOUNTER — Telehealth (HOSPITAL_COMMUNITY): Payer: Self-pay

## 2021-08-13 ENCOUNTER — Inpatient Hospital Stay: Payer: Medicare HMO

## 2021-08-13 ENCOUNTER — Telehealth: Payer: Self-pay

## 2021-08-13 ENCOUNTER — Encounter: Payer: Self-pay | Admitting: Thoracic Surgery (Cardiothoracic Vascular Surgery)

## 2021-08-13 ENCOUNTER — Other Ambulatory Visit: Payer: Medicare HMO

## 2021-08-13 VITALS — BP 119/55 | HR 73 | Temp 99.5°F | Resp 20

## 2021-08-13 DIAGNOSIS — I509 Heart failure, unspecified: Secondary | ICD-10-CM | POA: Diagnosis not present

## 2021-08-13 DIAGNOSIS — F17210 Nicotine dependence, cigarettes, uncomplicated: Secondary | ICD-10-CM | POA: Diagnosis not present

## 2021-08-13 DIAGNOSIS — C3412 Malignant neoplasm of upper lobe, left bronchus or lung: Secondary | ICD-10-CM

## 2021-08-13 DIAGNOSIS — C3432 Malignant neoplasm of lower lobe, left bronchus or lung: Secondary | ICD-10-CM | POA: Diagnosis not present

## 2021-08-13 DIAGNOSIS — I110 Hypertensive heart disease with heart failure: Secondary | ICD-10-CM | POA: Diagnosis not present

## 2021-08-13 DIAGNOSIS — Z5111 Encounter for antineoplastic chemotherapy: Secondary | ICD-10-CM | POA: Diagnosis not present

## 2021-08-13 DIAGNOSIS — R918 Other nonspecific abnormal finding of lung field: Secondary | ICD-10-CM

## 2021-08-13 DIAGNOSIS — J90 Pleural effusion, not elsewhere classified: Secondary | ICD-10-CM | POA: Diagnosis not present

## 2021-08-13 DIAGNOSIS — R69 Illness, unspecified: Secondary | ICD-10-CM | POA: Diagnosis not present

## 2021-08-13 DIAGNOSIS — C3492 Malignant neoplasm of unspecified part of left bronchus or lung: Secondary | ICD-10-CM

## 2021-08-13 LAB — CBC WITH DIFFERENTIAL (CANCER CENTER ONLY)
Abs Immature Granulocytes: 0.09 10*3/uL — ABNORMAL HIGH (ref 0.00–0.07)
Basophils Absolute: 0.1 10*3/uL (ref 0.0–0.1)
Basophils Relative: 1 %
Eosinophils Absolute: 0.1 10*3/uL (ref 0.0–0.5)
Eosinophils Relative: 1 %
HCT: 35.6 % — ABNORMAL LOW (ref 36.0–46.0)
Hemoglobin: 10.2 g/dL — ABNORMAL LOW (ref 12.0–15.0)
Immature Granulocytes: 1 %
Lymphocytes Relative: 7 %
Lymphs Abs: 0.8 10*3/uL (ref 0.7–4.0)
MCH: 26.3 pg (ref 26.0–34.0)
MCHC: 28.7 g/dL — ABNORMAL LOW (ref 30.0–36.0)
MCV: 91.8 fL (ref 80.0–100.0)
Monocytes Absolute: 1.1 10*3/uL — ABNORMAL HIGH (ref 0.1–1.0)
Monocytes Relative: 10 %
Neutro Abs: 8.3 10*3/uL — ABNORMAL HIGH (ref 1.7–7.7)
Neutrophils Relative %: 80 %
Platelet Count: 402 10*3/uL — ABNORMAL HIGH (ref 150–400)
RBC: 3.88 MIL/uL (ref 3.87–5.11)
RDW: 16.1 % — ABNORMAL HIGH (ref 11.5–15.5)
WBC Count: 10.4 10*3/uL (ref 4.0–10.5)
nRBC: 0 % (ref 0.0–0.2)

## 2021-08-13 LAB — CULTURE, BLOOD (ROUTINE X 2)
Culture: NO GROWTH
Culture: NO GROWTH
Special Requests: ADEQUATE
Special Requests: ADEQUATE

## 2021-08-13 LAB — CMP (CANCER CENTER ONLY)
ALT: 73 U/L — ABNORMAL HIGH (ref 0–44)
AST: 38 U/L (ref 15–41)
Albumin: 3 g/dL — ABNORMAL LOW (ref 3.5–5.0)
Alkaline Phosphatase: 82 U/L (ref 38–126)
Anion gap: 4 — ABNORMAL LOW (ref 5–15)
BUN: 9 mg/dL (ref 8–23)
CO2: >45 mmol/L — ABNORMAL HIGH (ref 22–32)
Calcium: 9.2 mg/dL (ref 8.9–10.3)
Chloride: 93 mmol/L — ABNORMAL LOW (ref 98–111)
Creatinine: 0.77 mg/dL (ref 0.44–1.00)
GFR, Estimated: >60 mL/min (ref >60)
Glucose, Bld: 129 mg/dL — ABNORMAL HIGH (ref 70–99)
Potassium: 3.4 mmol/L — ABNORMAL LOW (ref 3.5–5.1)
Sodium: 143 mmol/L (ref 135–145)
Total Bilirubin: 0.3 mg/dL (ref 0.3–1.2)
Total Protein: 6.5 g/dL (ref 6.5–8.1)

## 2021-08-13 MED ORDER — AMIODARONE HCL 200 MG PO TABS: ORAL_TABLET | ORAL | 0 refills | Status: DC

## 2021-08-13 MED ORDER — SODIUM CHLORIDE 0.9 % IV SOLN
10.0000 mg | Freq: Once | INTRAVENOUS | Status: AC
  Administered 2021-08-13: 10 mg via INTRAVENOUS
  Filled 2021-08-13: qty 10

## 2021-08-13 MED ORDER — SODIUM CHLORIDE 0.9 % IV SOLN
197.4000 mg | Freq: Once | INTRAVENOUS | Status: AC
  Administered 2021-08-13: 200 mg via INTRAVENOUS
  Filled 2021-08-13: qty 20

## 2021-08-13 MED ORDER — FAMOTIDINE IN NACL 20-0.9 MG/50ML-% IV SOLN
20.0000 mg | Freq: Once | INTRAVENOUS | Status: AC
  Administered 2021-08-13: 20 mg via INTRAVENOUS
  Filled 2021-08-13: qty 50

## 2021-08-13 MED ORDER — SODIUM CHLORIDE 0.9 % IV SOLN
45.0000 mg/m2 | Freq: Once | INTRAVENOUS | Status: AC
  Administered 2021-08-13: 90 mg via INTRAVENOUS
  Filled 2021-08-13: qty 15

## 2021-08-13 MED ORDER — PALONOSETRON HCL INJECTION 0.25 MG/5ML
0.2500 mg | Freq: Once | INTRAVENOUS | Status: AC
  Administered 2021-08-13: 0.25 mg via INTRAVENOUS
  Filled 2021-08-13: qty 5

## 2021-08-13 MED ORDER — SODIUM CHLORIDE 0.9 % IV SOLN: Freq: Once | INTRAVENOUS | Status: AC

## 2021-08-13 MED ORDER — DIPHENHYDRAMINE HCL 50 MG/ML IJ SOLN
50.0000 mg | Freq: Once | INTRAMUSCULAR | Status: AC
  Administered 2021-08-13: 50 mg via INTRAVENOUS
  Filled 2021-08-13: qty 1

## 2021-08-13 NOTE — Telephone Encounter (Signed)
Called to reschedule port, no answer, left vm. AW

## 2021-08-13 NOTE — Progress Notes (Signed)
Per Dr. Arbutus Ped , it is ok to treat pt today with  Taxol/Carboplatin after being treated for UTI in the hospital.

## 2021-08-14 ENCOUNTER — Other Ambulatory Visit: Payer: Self-pay

## 2021-08-14 ENCOUNTER — Ambulatory Visit: Payer: Medicare HMO

## 2021-08-14 ENCOUNTER — Ambulatory Visit
Admission: RE | Admit: 2021-08-14 | Discharge: 2021-08-14 | Disposition: A | Discharge status: Home / Self Care | Payer: Medicare HMO | Source: Ambulatory Visit | Attending: Radiation Oncology | Admitting: Radiation Oncology

## 2021-08-14 DIAGNOSIS — Z510 Encounter for antineoplastic radiation therapy: Secondary | ICD-10-CM | POA: Diagnosis not present

## 2021-08-14 DIAGNOSIS — Z87891 Personal history of nicotine dependence: Secondary | ICD-10-CM | POA: Diagnosis not present

## 2021-08-14 DIAGNOSIS — C3492 Malignant neoplasm of unspecified part of left bronchus or lung: Secondary | ICD-10-CM | POA: Diagnosis not present

## 2021-08-14 DIAGNOSIS — C3412 Malignant neoplasm of upper lobe, left bronchus or lung: Secondary | ICD-10-CM | POA: Diagnosis not present

## 2021-08-14 LAB — RAD ONC ARIA SESSION SUMMARY
Course Elapsed Days: 12
Plan Fractions Treated to Date: 6
Plan Prescribed Dose Per Fraction: 2 Gy
Plan Total Fractions Prescribed: 30
Plan Total Prescribed Dose: 60 Gy
Reference Point Dosage Given to Date: 12 Gy
Reference Point Session Dosage Given: 2 Gy
Session Number: 6

## 2021-08-15 ENCOUNTER — Other Ambulatory Visit: Payer: Self-pay

## 2021-08-15 ENCOUNTER — Other Ambulatory Visit: Payer: Self-pay | Admitting: Radiology

## 2021-08-15 ENCOUNTER — Telehealth: Payer: Self-pay

## 2021-08-15 ENCOUNTER — Ambulatory Visit
Admission: RE | Admit: 2021-08-15 | Discharge: 2021-08-15 | Disposition: A | Discharge status: Home / Self Care | Payer: Medicare HMO | Source: Ambulatory Visit | Attending: Radiation Oncology | Admitting: Radiation Oncology

## 2021-08-15 DIAGNOSIS — C3412 Malignant neoplasm of upper lobe, left bronchus or lung: Secondary | ICD-10-CM | POA: Diagnosis not present

## 2021-08-15 DIAGNOSIS — C3492 Malignant neoplasm of unspecified part of left bronchus or lung: Secondary | ICD-10-CM | POA: Diagnosis not present

## 2021-08-15 DIAGNOSIS — Z510 Encounter for antineoplastic radiation therapy: Secondary | ICD-10-CM | POA: Diagnosis not present

## 2021-08-15 DIAGNOSIS — Z87891 Personal history of nicotine dependence: Secondary | ICD-10-CM | POA: Diagnosis not present

## 2021-08-15 LAB — RAD ONC ARIA SESSION SUMMARY
Course Elapsed Days: 13
Plan Fractions Treated to Date: 7
Plan Prescribed Dose Per Fraction: 2 Gy
Plan Total Fractions Prescribed: 30
Plan Total Prescribed Dose: 60 Gy
Reference Point Dosage Given to Date: 14 Gy
Reference Point Session Dosage Given: 2 Gy
Session Number: 7

## 2021-08-15 NOTE — Telephone Encounter (Signed)
-----   Message from Anastasia Pall, RN sent at 08/13/2021  5:54 PM EDT ----- Regarding: 1st time Taxol/Carbo, being seen by Dr. Lianne Cure, Mrs. Pat received her 1st Taxol/Carbo infusion today. She tolerated the treatment well. Please follow-up with her tomorrow. Thank you.

## 2021-08-15 NOTE — Telephone Encounter (Signed)
LM for patient that this nurse was calling to see how they were doing after their treatment. Please call back to Dr. Mohamed's nurse at [phone] if they have any questions or concerns regarding the treatment.  

## 2021-08-16 ENCOUNTER — Other Ambulatory Visit: Payer: Self-pay

## 2021-08-16 ENCOUNTER — Ambulatory Visit (HOSPITAL_COMMUNITY)
Admission: RE | Admit: 2021-08-16 | Discharge: 2021-08-16 | Disposition: A | Discharge status: Home / Self Care | Payer: Medicare HMO | Source: Ambulatory Visit | Attending: Physician Assistant | Admitting: Physician Assistant

## 2021-08-16 ENCOUNTER — Encounter (HOSPITAL_COMMUNITY): Payer: Self-pay

## 2021-08-16 ENCOUNTER — Ambulatory Visit
Admission: RE | Admit: 2021-08-16 | Discharge: 2021-08-16 | Disposition: A | Discharge status: Home / Self Care | Payer: Medicare HMO | Source: Ambulatory Visit | Attending: Radiation Oncology | Admitting: Radiation Oncology

## 2021-08-16 ENCOUNTER — Inpatient Hospital Stay: Payer: Medicare HMO | Admitting: Nurse Practitioner

## 2021-08-16 DIAGNOSIS — Z87891 Personal history of nicotine dependence: Secondary | ICD-10-CM | POA: Diagnosis not present

## 2021-08-16 DIAGNOSIS — J449 Chronic obstructive pulmonary disease, unspecified: Secondary | ICD-10-CM | POA: Insufficient documentation

## 2021-08-16 DIAGNOSIS — C3410 Malignant neoplasm of upper lobe, unspecified bronchus or lung: Secondary | ICD-10-CM

## 2021-08-16 DIAGNOSIS — I252 Old myocardial infarction: Secondary | ICD-10-CM | POA: Insufficient documentation

## 2021-08-16 DIAGNOSIS — C3412 Malignant neoplasm of upper lobe, left bronchus or lung: Secondary | ICD-10-CM | POA: Insufficient documentation

## 2021-08-16 DIAGNOSIS — I2510 Atherosclerotic heart disease of native coronary artery without angina pectoris: Secondary | ICD-10-CM | POA: Diagnosis not present

## 2021-08-16 DIAGNOSIS — K219 Gastro-esophageal reflux disease without esophagitis: Secondary | ICD-10-CM | POA: Insufficient documentation

## 2021-08-16 DIAGNOSIS — F17210 Nicotine dependence, cigarettes, uncomplicated: Secondary | ICD-10-CM | POA: Insufficient documentation

## 2021-08-16 DIAGNOSIS — I10 Essential (primary) hypertension: Secondary | ICD-10-CM | POA: Insufficient documentation

## 2021-08-16 DIAGNOSIS — C3492 Malignant neoplasm of unspecified part of left bronchus or lung: Secondary | ICD-10-CM | POA: Diagnosis not present

## 2021-08-16 DIAGNOSIS — Z452 Encounter for adjustment and management of vascular access device: Secondary | ICD-10-CM | POA: Diagnosis not present

## 2021-08-16 DIAGNOSIS — Z510 Encounter for antineoplastic radiation therapy: Secondary | ICD-10-CM | POA: Diagnosis not present

## 2021-08-16 DIAGNOSIS — J9819 Other pulmonary collapse: Secondary | ICD-10-CM | POA: Diagnosis not present

## 2021-08-16 DIAGNOSIS — R69 Illness, unspecified: Secondary | ICD-10-CM | POA: Diagnosis not present

## 2021-08-16 HISTORY — PX: IR IMAGING GUIDED PORT INSERTION: IMG5740

## 2021-08-16 LAB — RAD ONC ARIA SESSION SUMMARY
Course Elapsed Days: 14
Plan Fractions Treated to Date: 8
Plan Prescribed Dose Per Fraction: 2 Gy
Plan Total Fractions Prescribed: 30
Plan Total Prescribed Dose: 60 Gy
Reference Point Dosage Given to Date: 16 Gy
Reference Point Session Dosage Given: 2 Gy
Session Number: 8

## 2021-08-16 MED ORDER — MIDAZOLAM HCL 2 MG/2ML IJ SOLN
INTRAMUSCULAR | Status: AC | PRN
  Administered 2021-08-16: .5 mg via INTRAVENOUS

## 2021-08-16 MED ORDER — HEPARIN SOD (PORK) LOCK FLUSH 100 UNIT/ML IV SOLN
INTRAVENOUS | Status: AC | PRN
  Administered 2021-08-16: 500 [IU] via INTRAVENOUS

## 2021-08-16 MED ORDER — MIDAZOLAM HCL 2 MG/2ML IJ SOLN
INTRAMUSCULAR | Status: AC
  Filled 2021-08-16: qty 2

## 2021-08-16 MED ORDER — SODIUM CHLORIDE 0.9 % IV SOLN: INTRAVENOUS | Status: DC

## 2021-08-16 MED ORDER — HEPARIN SOD (PORK) LOCK FLUSH 100 UNIT/ML IV SOLN
INTRAVENOUS | Status: AC
  Filled 2021-08-16: qty 5

## 2021-08-16 MED ORDER — LIDOCAINE HCL 1 % IJ SOLN
INTRAMUSCULAR | Status: AC
  Administered 2021-08-16: 20 mL
  Filled 2021-08-16: qty 20

## 2021-08-16 NOTE — H&P (Signed)
Chief Complaint: Patient was seen in consultation today for left lung cancer  Referring Physician(s): Heilingoetter,Cassandra L  Supervising Physician: Corrie Mckusick  Patient Status: [facility] - Out-pt  History of Present Illness: [name] is a 76 y.o. female with past medical history of GERD, HTN, MI, CAD COPD who was diagnosed with NSCLC s/p left upper lobectomy 2012 who underwent radiation therapy Nov-Dec of 2022 who now has plans for upcoming chemotherapy.  She is in need of durable venous access.  IR consulted for Port-A-Cath placement.   Patient assessed in [facility] for the procedure today.  She is stable on 4L [place].  States she typically uses 3L at home. Has an appointment with [facility] next month to discuss left lung collapse.  Recently treated for UTI during hospitalization and was discharged 6/24.   Past Medical History:  Diagnosis Date   CAD (coronary artery disease)    Cancer ([place])    COPD (chronic obstructive pulmonary disease) ([place])    per DM note, pt denies   Dyslipidemia    GERD (gastroesophageal reflux disease)    History of radiation therapy    Left lung- 01/16/21-01/30/21- Dr. Gery Pray   HTN (hypertension)    Hypothyroidism    Myocardial infarct [facility])    Non-small cell lung cancer ([place]) 08/19/2010   Stage IA, status post left upper lobectomy July 2012   Tobacco abuse     Past Surgical History:  Procedure Laterality Date   CARDIAC CATHETERIZATION  07/06/2010   see CABG report - pt sent to OR   CARDIOVASCULAR STRESS TEST  09/11/2010   R/S MV - normal perfusion in all regions, EF 46%, no scintigraphic evidence of inducible myocardial ischemia; global LV systolic function mildly reduced; no significant wall motion abnormalities noted; Exercise capacity 7 METS; EKG negative for ischemia; low risk study, no signifcant change from previous study 08/2003   CORONARY ARTERY BYPASS GRAFT  07/11/2010   LIMA to LAD; SVG to 2nd branch OM; SVG to posterior  descending artery   LEFT VATS  09/17/2010   TEE WITHOUT CARDIOVERSION  07/06/2010   during emergent CABG surgery; 2-3+ mitral regurgitation   VIDEO BRONCHOSCOPY N/A 07/27/2021   Procedure: VIDEO BRONCHOSCOPY;  Surgeon: Melrose Nakayama, MD;  Location: [place];  Service: Thoracic;  Laterality: N/A;   VIDEO BRONCHOSCOPY WITH ENDOBRONCHIAL ULTRASOUND N/A 07/27/2021   Procedure: VIDEO BRONCHOSCOPY WITH ENDOBRONCHIAL ULTRASOUND;  Surgeon: Melrose Nakayama, MD;  Location: [facility] OR;  Service: Thoracic;  Laterality: N/A;    Allergies: Codeine  Medications: Prior to Admission medications   Medication Sig Start Date End Date Taking? Authorizing Provider  acetaminophen (TYLENOL) 500 MG tablet Take 500 mg by mouth every 6 (six) hours as needed for moderate pain or fever.    [provider]  albuterol (PROAIR HFA) 108 (90 Base) MCG/ACT inhaler 2 puffs up to every 4 hours as needed only  if your can't catch your breath Patient taking differently: Inhale 2 puffs into the lungs every 4 (four) hours as needed for shortness of breath. 03/27/21   Tanda Rockers, MD  albuterol (PROVENTIL) (2.5 MG/3ML) 0.083% nebulizer solution Take 2.5 mg by nebulization 3 (three) times daily as needed for wheezing or shortness of breath.    [provider]  amiodarone (PACERONE) 200 MG tablet Take 1 tablet (200 mg total) by mouth 2 (two) times daily. 08/18/21   Shawna Clamp, MD  amiodarone (PACERONE) 200 MG tablet Advised to take amiodarone 400 mg twice daily  for 7 days followed by amiodarone 200 mg twice daily for 7 days then followed by amiodarone 200 mg daily until follow-up with cardiology. 08/25/21   [name], NP  aspirin 81 MG tablet Take 81 mg by mouth at bedtime.    [provider]  chlorpheniramine-HYDROcodone 10-8 MG/5ML Take 5 mLs by mouth at bedtime as needed for cough. 08/02/21   [provider]  Coenzyme Q10 (CO Q 10 PO) Take 1 capsule by mouth daily.    [provider]  fluconazole (DIFLUCAN) 100 MG tablet Take 1 tablet (100 mg total) by mouth daily. Patient taking differently: Take 100 mg by mouth See admin instructions. Qd x 10 days 08/06/21   Heilingoetter, Cassandra L, PA-C  furosemide (LASIX) 20 MG tablet Take 1 tablet (20 mg total) by mouth daily. 04/09/21   Lorrene Reid, PA-C  levothyroxine (SYNTHROID) 25 MCG tablet TAKE 0.5 TABLETS (12.5 MCG TOTAL) BY MOUTH DAILY. IN ADDITION TO THE 50 MCG. Patient taking differently: Take 12.5 mcg by mouth See admin instructions. 12.5 mcg daily except on sunday 11/17/19   Troy Sine, MD  levothyroxine (SYNTHROID) 50 MCG tablet TAKE 1 TABLET BY MOUTH EVERY DAY BEFORE BREAKFAST Patient taking differently: Take 50 mcg by mouth See admin instructions. Along with 12.5 mcg qd except on Sunday (pt just takes 50 mcg) 04/09/21   Troy Sine, MD  lidocaine-prilocaine (EMLA) cream Apply 1 Application topically as needed. Patient taking differently: Apply 1 Application topically as needed (prior to port access). 08/06/21   Heilingoetter, Cassandra L, PA-C  metoprolol succinate (TOPROL-XL) 100 MG 24 hr tablet Take 1 tablet (100 mg total) by mouth daily. Take with or immediately following a meal. Patient taking differently: Take 100 mg by mouth daily. 12/20/20   [name], NP  Omega-3 Fatty Acids (OMEGA-3 PO) Take 1,000 mg by mouth in the morning and at bedtime.    [provider]  OXYGEN Inhale 3.5 L into the lungs continuous.    [provider]  prochlorperazine (COMPAZINE) 10 MG tablet Take 1 tablet (10 mg total) by mouth every 6 (six) hours as needed. Patient taking differently: Take 10 mg by mouth every 6 (six) hours as needed for nausea or vomiting. 08/06/21   Heilingoetter, Cassandra L, PA-C  rosuvastatin (CRESTOR) 40 MG tablet Take 1 tablet (40 mg total) by mouth daily. Patient taking differently: Take 40 mg by mouth every evening. 11/29/20   Troy Sine, MD     Family  History  Problem Relation Age of Onset   Hypothyroidism Father    Heart attack Father    Aplastic anemia Maternal Grandfather    Stroke Paternal Grandfather     Social History   Socioeconomic History   Marital status: Married    Spouse name: Not on file   Number of children: Not on file   Years of education: Not on file   Highest education level: Not on file  Occupational History   Not on file  Tobacco Use   Smoking status: Every Day    Packs/day: 1.00    Years: 58.00    Total pack years: 58.00    Types: Cigarettes    Last attempt to quit: 03/21/2020    Years since quitting: 1.4   Smokeless tobacco: Never   Tobacco comments:    Pt states she is currently smoking  Vaping Use   Vaping Use: Never used  Substance and Sexual Activity   Alcohol use: No  Alcohol/week: 0.0 standard drinks of alcohol   Drug use: No   Sexual activity: Never  Other Topics Concern   Not on file  Social History Narrative   Not on file   Social Determinants of Health   Financial Resource Strain: Not on file  Food Insecurity: Not on file  Transportation Needs: Not on file  Physical Activity: Not on file  Stress: Not on file  Social Connections: Not on file     Review of Systems: A 12 point ROS discussed and pertinent positives are indicated in the HPI above.  All other systems are negative.  Review of Systems  Constitutional:  Negative for fatigue and fever.  Respiratory:  Negative for cough and shortness of breath.   Cardiovascular:  Negative for chest pain.  Gastrointestinal:  Negative for abdominal pain, diarrhea and nausea.  Musculoskeletal:  Negative for back pain.  Psychiatric/Behavioral:  Negative for behavioral problems and confusion.     Vital Signs: BP (!) 116/56   Pulse 69   Temp 98.3 F (36.8 C)   Resp 19   Ht 5\' 3"  (1.6 m)   Wt 210 lb (95.3 kg)   SpO2 96%   BMI 37.20 kg/m   Physical Exam Vitals and nursing note reviewed.  Constitutional:      General: She is  not in acute distress.    Appearance: Normal appearance. She is not ill-appearing.  HENT:     Mouth/Throat:     Mouth: Mucous membranes are moist.     Pharynx: Oropharynx is clear.  Cardiovascular:     Rate and Rhythm: Normal rate and regular rhythm.  Pulmonary:     Effort: Pulmonary effort is normal. No respiratory distress.     Comments: On 4 L, decreased  breath sounds Abdominal:     General: Abdomen is flat.     Palpations: Abdomen is soft.  Skin:    General: Skin is warm and dry.  Neurological:     General: No focal deficit present.     Mental Status: She is alert and oriented to person, place, and time. Mental status is at baseline.  Psychiatric:        Mood and Affect: Mood normal.        Behavior: Behavior normal.      MD Evaluation Airway: WNL Heart: WNL Abdomen: WNL Chest/ Lungs: WNL ASA  Classification: 3 Mallampati/Airway Score: Two   Imaging: MR BRAIN W WO CONTRAST  Result Date: 08/10/2021 CLINICAL DATA:  Evaluate for metastatic disease EXAM: MRI HEAD WITHOUT AND WITH CONTRAST TECHNIQUE: Multiplanar, multiecho pulse sequences of the brain and surrounding structures were obtained without and with intravenous contrast. CONTRAST:  9.5mL GADAVIST GADOBUTROL 1 MMOL/ML IV SOLN COMPARISON:  CT head dated 1 day prior FINDINGS: Brain: There is no acute intracranial hemorrhage, extra-axial fluid collection, or acute infarct. Parenchymal volume is normal for age. The ventricles are normal in size. Patchy FLAIR signal abnormality throughout the subcortical and periventricular white matter likely reflects sequela of chronic white matter microangiopathy. Curvilinear SWI signal dropout in both parieto-occipital regions is consistent with sequela of prior hemorrhage. Additional punctate chronic microhemorrhages are noted both frontal lobes predominantly peripherally. There is no abnormal enhancement. The finding on the prior head CT was likely artifactual, as there is no abnormal  enhancement in the pons on the current study. There is no suspicious parenchymal signal abnormality. There is no mass effect or midline shift. Vascular: Normal flow voids. Skull and upper cervical spine: Normal marrow signal.  Sinuses/Orbits: The paranasal sinuses are clear. The globes and orbits are unremarkable. Other: None. IMPRESSION: 1. No evidence of intracranial metastatic disease. The finding on the prior head CT was likely artifactual. 2. Scattered foci of chronic microhemorrhage predominantly peripherally distributed raise suspicion for cerebral amyloid angiopathy. Electronically Signed   By: Valetta Mole M.D.   On: 08/10/2021 16:20   CT Head W or Wo Contrast  Result Date: 08/09/2021 CLINICAL DATA:  Non-small cell lung cancer, evaluate for brain Mets prior to anticoagulation EXAM: CT HEAD WITHOUT AND WITH CONTRAST TECHNIQUE: Contiguous axial images were obtained from the base of the skull through the vertex without and with intravenous contrast. RADIATION DOSE REDUCTION: This exam was performed according to the departmental dose-optimization program which includes automated exposure control, adjustment of the mA and/or kV according to patient size and/or use of iterative reconstruction technique. CONTRAST:  53mL OMNIPAQUE IOHEXOL 300 MG/ML  SOLN COMPARISON:  None Available. FINDINGS: Brain: 3 mm focus of enhancement in the pons (series 11, image 27), which is nonspecific and could represent an enhancing lesion versus a vascular lesion such as a capillary telangiectasia. No additional foci of enhancement are seen. No evidence of acute infarction, hemorrhage, cerebral edema, mass effect, or midline shift. No hydrocephalus or extra-axial fluid collection. Vascular: No hyperdense vessel. Normal enhancement of the circle-of-Willis. Skull: Hyperostosis frontalis. Negative for fracture or focal lesion. Sinuses/Orbits: No acute finding. Other: The mastoid air cells are well aerated. IMPRESSION: 3 mm focus of  enhancement in the pons, which is nonspecific and could represent an enhancing lesion versus a vascular lesion such as a capillary telangiectasia. An MRI of the brain with and without contrast may help distinguish the etiology, and is far more sensitive for small metastatic lesions. Electronically Signed   By: Merilyn Baba M.D.   On: 08/09/2021 02:05   CT CHEST W CONTRAST  Result Date: 08/07/2021 CLINICAL DATA:  Pleural effusion, malignancy suspected EXAM: CT CHEST WITH CONTRAST TECHNIQUE: Multidetector CT imaging of the chest was performed during intravenous contrast administration. RADIATION DOSE REDUCTION: This exam was performed according to the departmental dose-optimization program which includes automated exposure control, adjustment of the mA and/or kV according to patient size and/or use of iterative reconstruction technique. CONTRAST:  66mL OMNIPAQUE IOHEXOL 300 MG/ML  SOLN COMPARISON:  Pet CT 06/08/2021, chest x-ray 07/25/2021, chest x-ray 08/07/2021 FINDINGS: Cardiovascular: Normal heart size. No significant pericardial effusion. The thoracic aorta is normal in caliber. No atherosclerotic plaque of the thoracic aorta. Four-vessel coronary artery calcifications status post coronary artery bypass graft. The main pulmonary artery is normal in caliber. No central or segmental pulmonary embolus. Mediastinum/Nodes: Known left hilar mass lesion poorly visualized measuring up to at least 4 cm. No right hilar lymphadenopathy. No enlarged mediastinal or axillary lymph nodes. Thyroid gland, trachea, and esophagus demonstrate no significant findings. Lungs/Pleura: Interval complete hypodense heterogeneous opacification of the left lung consistent. Interval development of a at least small volume left pleural effusion. The right lung is clear. No right pleural effusion. No pneumothorax. Upper Abdomen: Subcentimeter splenule (2:124). No acute abnormality. Musculoskeletal: No chest wall abnormality. No suspicious  lytic or blastic osseous lesions. No acute displaced fracture. Multilevel degenerative changes of the spine. Intact sternotomy wires. IMPRESSION: Interval development of postobstructive infection/collapse of the entire left lung in the setting of known left hilar mass lesion and interval development of at least small volume left pleural effusion. Electronically Signed   By: Iven Finn M.D.   On: 08/07/2021 17:38   DG  Chest Port 1 View  Result Date: 08/07/2021 CLINICAL DATA:  Shortness of breath EXAM: PORTABLE CHEST 1 VIEW COMPARISON:  Chest radiograph dated July 26, 2021 FINDINGS: Evaluation of cardiomediastinal silhouette is limited due to complete opacification of the left hemithorax. Right lung is clear. Sternotomy wires representing prior coronary artery bypass grafting unchanged. IMPRESSION: Complete opacification of the left hemithorax, which may represent atelectasis or effusion. Right lung is clear. Electronically Signed   By: Keane Police D.O.   On: 08/07/2021 14:22   DG Chest 2 View  Result Date: 07/26/2021 CLINICAL DATA:  Pre-op for video bronchoscopy. EXAM: CHEST - 2 VIEW COMPARISON:  Chest radiograph 07/03/2020.  Head CT 06/08/2021 FINDINGS: Markedly increased densities in the mid and lower chest are suggestive for volume loss and probable left pleural fluid. Streaky densities in left upper lung could represent postobstructive changes. Again noted are past CABG changes. Right lung remains clear. Negative for pneumothorax. Left cardiac border is obscured by the left chest densities. IMPRESSION: Increased densities in the left chest are most compatible with volume loss and probably pleural fluid. Streaky densities in the left upper lung could also represent postobstructive changes. Electronically Signed   By: Markus Daft M.D.   On: 07/26/2021 21:16    Labs:  CBC: Recent Labs    08/08/21 2154 08/08/21 2333 08/09/21 0509 08/11/21 0316 08/13/21 1028  WBC 13.3*  --  10.5 10.7* 10.4  HGB  10.8* 11.9* 10.8* 9.9* 10.2*  HCT 37.3 35.0* 38.0 34.1* 35.6*  PLT 281  --  278 322 402*    COAGS: Recent Labs    07/25/21 1524  INR 1.1  APTT 28    BMP: Recent Labs    08/08/21 2154 08/08/21 2333 08/09/21 0509 08/11/21 0316 08/13/21 1028  NA 141 138 142 140 143  K 3.3* 3.3* 3.5 3.5 3.4*  CL 94*  --  97* 98 93*  CO2 36*  --  37* 36* >45*  GLUCOSE 156*  --  107* 148* 129*  BUN 8  --  8 7* 9  CALCIUM 8.7*  --  8.6* 8.5* 9.2  CREATININE 0.79  --  0.87 0.91 0.77  GFRNONAA >60  --  >60 >60 >60    LIVER FUNCTION TESTS: Recent Labs    08/06/21 0744 08/07/21 1358 08/08/21 2154 08/13/21 1028  BILITOT 0.4 0.6 0.4 0.3  AST 19 32 43* 38  ALT 32 47* 54* 73*  ALKPHOS 71 71 70 82  PROT 6.3* 7.2 5.8* 6.5  ALBUMIN 3.0* 2.8* 2.1* 3.0*    TUMOR MARKERS: No results for input(s): "AFPTM", "CEA", "CA199", "CHROMGRNA" in the last 8760 hours.  Assessment and Plan: Patient with past medical history of NSCLC presents for Port-A-Cath placement at the request of Cassandra Heillingoetter, PA-C. Case reviewed by Dr. Earleen Newport who approves patient for procedure.  Patient presents today in their usual state of health.  She has been NPO and is not currently on blood thinners.   Risks and benefits of image guided port-a-catheter placement was discussed with the patient including, but not limited to bleeding, infection, pneumothorax, or fibrin sheath development and need for additional procedures.  All of the patient's questions were answered, patient is agreeable to proceed. Consent signed and in chart.   Thank you for this interesting consult.  I greatly enjoyed meeting [name] and look forward to participating in their care.  A copy of this report was sent to the requesting provider on this date.  Electronically Signed: Sherlie Ban  Nichola Sizer, PA 08/16/2021, 11:44 AM   I spent a total of  30 Minutes   in face to face in clinical consultation, greater than 50% of which was  counseling/coordinating care for lung cancer.

## 2021-08-16 NOTE — Progress Notes (Signed)
Established patient visit   Patient: [name]   DOB: [birth year]   76 y.o. Female  MRN: [id number] Visit Date: 08/17/2021   Chief Complaint  Patient presents with   Hospitalization Follow-up   Subjective    HPI  Hospital follow up.  -admitted 08/08/2021 through 08/11/2021. Due to new onset atrial fibrillation. Unable to see cardiology until August.  -prior to this hospitalization had been hospitalized after lung collapsed after biopsy of lung mass.  -will need to have check of BMP and CBC. These were drawn 08/13/2021 - potassium was 3.3. she is mildly anemic. Continues to have weekly chemotherapy and daily radiation. Sees oncologist every Tuesday. Had port placed last week.  Started on amiodarone. No anticoagulation due to high risk of bleeding.   -due to see cardiology and CT surgery - already has appointment with him next week.  -ankles have been swollen    Medications: Outpatient Medications Prior to Visit  Medication Sig   acetaminophen (TYLENOL) 500 MG tablet Take 500 mg by mouth every 6 (six) hours as needed for moderate pain or fever.   albuterol (PROAIR HFA) 108 (90 Base) MCG/ACT inhaler 2 puffs up to every 4 hours as needed only  if your can't catch your breath (Patient taking differently: Inhale 2 puffs into the lungs every 4 (four) hours as needed for shortness of breath.)   albuterol (PROVENTIL) (2.5 MG/3ML) 0.083% nebulizer solution Take 2.5 mg by nebulization 3 (three) times daily as needed for wheezing or shortness of breath.   amiodarone (PACERONE) 200 MG tablet Advised to take amiodarone 400 mg twice daily for 7 days followed by amiodarone 200 mg twice daily for 7 days then followed by amiodarone 200 mg daily until follow-up with cardiology.   aspirin 81 MG tablet Take 81 mg by mouth at bedtime.   chlorpheniramine-HYDROcodone 10-8 MG/5ML Take 5 mLs by mouth at bedtime as needed for cough.   Coenzyme Q10 (CO Q 10 PO) Take 1 capsule by mouth daily.   fluconazole  (DIFLUCAN) 100 MG tablet Take 1 tablet (100 mg total) by mouth daily.   levothyroxine (SYNTHROID) 25 MCG tablet TAKE 0.5 TABLETS (12.5 MCG TOTAL) BY MOUTH DAILY. IN ADDITION TO THE 50 MCG. (Patient taking differently: Take 12.5 mcg by mouth See admin instructions. 12.5 mcg daily except on sunday)   levothyroxine (SYNTHROID) 50 MCG tablet TAKE 1 TABLET BY MOUTH EVERY DAY BEFORE BREAKFAST (Patient taking differently: Take 50 mcg by mouth See admin instructions. Along with 12.5 mcg qd except on Sunday (pt just takes 50 mcg))   lidocaine-prilocaine (EMLA) cream Apply 1 Application topically as needed. (Patient taking differently: Apply 1 Application topically as needed (prior to port access).)   metoprolol succinate (TOPROL-XL) 100 MG 24 hr tablet Take 1 tablet (100 mg total) by mouth daily. Take with or immediately following a meal. (Patient taking differently: Take 100 mg by mouth daily.)   Omega-3 Fatty Acids (OMEGA-3 PO) Take 1,000 mg by mouth in the morning and at bedtime.   OXYGEN Inhale 3.5 L into the lungs continuous.   prochlorperazine (COMPAZINE) 10 MG tablet Take 1 tablet (10 mg total) by mouth every 6 (six) hours as needed.   rosuvastatin (CRESTOR) 40 MG tablet Take 1 tablet (40 mg total) by mouth daily. (Patient taking differently: Take 40 mg by mouth every evening.)   [DISCONTINUED] amiodarone (PACERONE) 200 MG tablet Take 1 tablet (200 mg total) by mouth 2 (two) times daily.   [DISCONTINUED] furosemide (LASIX) 20 MG tablet  Take 1 tablet (20 mg total) by mouth daily.   No facility-administered medications prior to visit.    Review of Systems  Constitutional:  Positive for activity change and fatigue. Negative for appetite change, chills and fever.  HENT:  Negative for congestion, postnasal drip, rhinorrhea, sinus pressure, sinus pain, sneezing and sore throat.   Eyes: Negative.   Respiratory:  Positive for shortness of breath. Negative for cough, chest tightness and wheezing.    Cardiovascular:  Positive for leg swelling. Negative for chest pain and palpitations.  Gastrointestinal:  Negative for abdominal pain, constipation, diarrhea, nausea and vomiting.  Endocrine: Negative for cold intolerance, heat intolerance, polydipsia and polyuria.  Genitourinary:  Negative for dyspareunia, dysuria, flank pain, frequency and urgency.  Musculoskeletal:  Negative for arthralgias, back pain and myalgias.  Skin:  Negative for rash.  Allergic/Immunologic: Positive for environmental allergies.  Neurological:  Negative for dizziness, weakness and headaches.  Hematological:  Negative for adenopathy.  Psychiatric/Behavioral:  Positive for dysphoric mood. The patient is not nervous/anxious.     Last CBC Lab Results  Component Value Date   WBC 4.8 08/27/2021   HGB 8.9 (L) 08/27/2021   HCT 30.4 (L) 08/27/2021   MCV 89.9 08/27/2021   MCH 26.3 08/27/2021   RDW 16.5 (H) 08/27/2021   PLT 247 12/75/1700   Last metabolic panel Lab Results  Component Value Date   GLUCOSE 120 (H) 08/27/2021   NA 142 08/27/2021   K 4.1 08/27/2021   CL 97 (L) 08/27/2021   CO2 43 (H) 08/27/2021   BUN 10 08/27/2021   CREATININE 0.81 08/27/2021   GFRNONAA >60 08/27/2021   CALCIUM 8.9 08/27/2021   PHOS 3.2 08/11/2021   PROT 6.0 (L) 08/27/2021   ALBUMIN 3.0 (L) 08/27/2021   LABGLOB 2.5 05/02/2021   AGRATIO 1.6 05/02/2021   BILITOT 0.3 08/27/2021   ALKPHOS 70 08/27/2021   AST 41 08/27/2021   ALT 67 (H) 08/27/2021   ANIONGAP 2 (L) 08/27/2021       Objective     Today's Vitals   08/17/21 1018  BP: (!) 94/55  Pulse: 71  Temp: (!) 96.2 F (35.7 C)  SpO2: (!) 82%  Weight: 211 lb 6.4 oz (95.9 kg)  Height: 5' 2.99" (1.6 m)   Body mass index is 37.46 kg/m.   BP Readings from Last 3 Encounters:  08/29/21 (!) 98/52  08/27/21 (!) 111/54  08/22/21 (!) 106/48    Wt Readings from Last 3 Encounters:  08/29/21 208 lb (94.3 kg)  08/22/21 208 lb 12.8 oz (94.7 kg)  08/20/21 208 lb 1.6 oz  (94.4 kg)    Physical Exam Vitals and nursing note reviewed.  Constitutional:      Appearance: Normal appearance. She is well-developed. She is ill-appearing.  HENT:     Head: Normocephalic and atraumatic.     Nose: Nose normal.     Mouth/Throat:     Mouth: Mucous membranes are moist.     Pharynx: Oropharynx is clear.  Eyes:     Extraocular Movements: Extraocular movements intact.     Conjunctiva/sclera: Conjunctivae normal.     Pupils: Pupils are equal, round, and reactive to light.  Cardiovascular:     Rate and Rhythm: Normal rate and regular rhythm.     Pulses: Normal pulses.     Heart sounds: Normal heart sounds.     Comments: Mild lowerr extremity edema, bilaterally  Pulmonary:     Effort: Pulmonary effort is normal.     Breath sounds:  Normal breath sounds.     Comments: Breath sounds are clear, but diminished bilaterally  Abdominal:     Palpations: Abdomen is soft.  Musculoskeletal:        General: Normal range of motion.     Cervical back: Normal range of motion and neck supple.  Lymphadenopathy:     Cervical: No cervical adenopathy.  Skin:    General: Skin is warm and dry.     Capillary Refill: Capillary refill takes less than 2 seconds.  Neurological:     General: No focal deficit present.     Mental Status: She is alert and oriented to person, place, and time.  Psychiatric:        Mood and Affect: Mood normal.        Behavior: Behavior normal.        Thought Content: Thought content normal.        Judgment: Judgment normal.     Results for orders placed or performed in visit on 08/17/21  Rad Onc [facility] Session Summary  Result Value Ref Range   Course ID [email or web address]    Course Intent Curative    Course Start Date 08/02/2021 10:11 AM    Session Number 9    Course First Treatment Date 08/02/2021  4:34 PM    Course Last Treatment Date 08/17/2021  2:34 PM    Course Elapsed Days 15    Reference Point ID Lung DP    Reference Point Dosage Given to Date 18.00000009 Gy    Reference Point Session Dosage Given 2.00000001 Gy   Plan ID Lung_Lt    Plan Name Lung_Lt    Plan Fractions Treated to Date 9    Plan Total Fractions Prescribed 30    Plan Prescribed Dose Per Fraction 2 Gy   Plan Total Prescribed Dose 60.000000 Gy   Plan Primary Reference Point Lung DP     Assessment & Plan    1. Hospital discharge follow-up admitted 08/08/2021 through 08/11/2021 due to new onset atrial fibrillation. Will follow up with cardiology in August   2. Atrial fibrillation, unspecified type ([place]) Started on amiodarone in hospital. Continue as prescribed until evaluated per cardiology.   3. Collapse of left lung Patient had been hospitalized prior to most recent hospitalization due to collapse of left lung. Improved/stable. Patient to follow up with CT surgery as scheduled.   4. Lower extremity edema May take furosemide 20 mg twice daily for 5 days then take daily. Take potassium supplement daily. Check BMP today and treat potassium deficiency as indicated.  - potassium chloride SA (KLOR-CON M) 10 MEQ tablet; Take 1 tablet (10 mEq total) by mouth daily.  Dispense: 30 tablet; Refill: 3 - furosemide (LASIX) 20 MG tablet; Take 1 tablet po BID for 5 days then go back to taking 1 tablet po QD  Dispense: 90 tablet; Refill: 1  5. Hypokalemia Take potassium 10 mEq daily. Check CMP today and treat persistent or worsening hypokalemia as indicated  - potassium chloride SA (KLOR-CON M) 10 MEQ tablet; Take 1 tablet (10 mEq total) by mouth daily.  Dispense: 30 tablet; Refill: 3   Problem List Items Addressed This Visit       Cardiovascular and Mediastinum   Atrial fibrillation (HCC)   Relevant Medications   furosemide (LASIX) 20 MG tablet     Respiratory   Collapse of left lung     Other   Hospital discharge follow-up - Primary   Lower extremity edema   Relevant Medications  potassium chloride SA (KLOR-CON M) 10 MEQ tablet   furosemide (LASIX) 20 MG tablet   Hypokalemia    Relevant Medications   potassium chloride SA (KLOR-CON M) 10 MEQ tablet     Return in about 4 weeks (around 09/14/2021) for blood pressure - ankle swelling (see below) .         [name], NP  [facility] [phone] (phone) [phone] (fax)  [place]

## 2021-08-16 NOTE — Procedures (Signed)
Interventional Radiology Procedure Note  Procedure: Placement of a right IJ approach single lumen PowerPort.  Tip is positioned at the superior cavoatrial junction and catheter is ready for immediate use.  Complications: None Recommendations:  - Ok to shower tomorrow - Do not submerge for 7 days - Routine line care   Signed,  Jawann Urbani S. Kyl Givler, DO   

## 2021-08-17 ENCOUNTER — Ambulatory Visit
Admission: RE | Admit: 2021-08-17 | Discharge: 2021-08-17 | Disposition: A | Discharge status: Home / Self Care | Payer: Medicare HMO | Source: Ambulatory Visit | Attending: Radiation Oncology | Admitting: Radiation Oncology

## 2021-08-17 ENCOUNTER — Other Ambulatory Visit: Payer: Self-pay

## 2021-08-17 ENCOUNTER — Encounter: Payer: Self-pay | Admitting: Nurse Practitioner

## 2021-08-17 ENCOUNTER — Ambulatory Visit (INDEPENDENT_AMBULATORY_CARE_PROVIDER_SITE_OTHER): Payer: Medicare HMO | Admitting: Nurse Practitioner

## 2021-08-17 VITALS — BP 94/55 | HR 71 | Temp 96.2°F | Ht 62.99 in | Wt 211.4 lb

## 2021-08-17 DIAGNOSIS — J9811 Atelectasis: Secondary | ICD-10-CM

## 2021-08-17 DIAGNOSIS — C3412 Malignant neoplasm of upper lobe, left bronchus or lung: Secondary | ICD-10-CM | POA: Diagnosis not present

## 2021-08-17 DIAGNOSIS — E876 Hypokalemia: Secondary | ICD-10-CM | POA: Diagnosis not present

## 2021-08-17 DIAGNOSIS — R600 Localized edema: Secondary | ICD-10-CM | POA: Diagnosis not present

## 2021-08-17 DIAGNOSIS — Z510 Encounter for antineoplastic radiation therapy: Secondary | ICD-10-CM | POA: Diagnosis not present

## 2021-08-17 DIAGNOSIS — Z87891 Personal history of nicotine dependence: Secondary | ICD-10-CM | POA: Diagnosis not present

## 2021-08-17 DIAGNOSIS — I4891 Unspecified atrial fibrillation: Secondary | ICD-10-CM | POA: Diagnosis not present

## 2021-08-17 DIAGNOSIS — Z09 Encounter for follow-up examination after completed treatment for conditions other than malignant neoplasm: Secondary | ICD-10-CM

## 2021-08-17 DIAGNOSIS — C3492 Malignant neoplasm of unspecified part of left bronchus or lung: Secondary | ICD-10-CM | POA: Diagnosis not present

## 2021-08-17 LAB — RAD ONC ARIA SESSION SUMMARY
Course Elapsed Days: 15
Plan Fractions Treated to Date: 9
Plan Prescribed Dose Per Fraction: 2 Gy
Plan Total Fractions Prescribed: 30
Plan Total Prescribed Dose: 60 Gy
Reference Point Dosage Given to Date: 18 Gy
Reference Point Session Dosage Given: 2 Gy
Session Number: 9

## 2021-08-17 MED ORDER — POTASSIUM CHLORIDE CRYS ER 10 MEQ PO TBCR: 10.0000 meq | EXTENDED_RELEASE_TABLET | Freq: Every day | ORAL | 3 refills | Status: DC

## 2021-08-17 MED ORDER — FUROSEMIDE 20 MG PO TABS: ORAL_TABLET | ORAL | 1 refills | Status: DC

## 2021-08-17 MED FILL — Dexamethasone Sodium Phosphate Inj 100 MG/10ML: INTRAMUSCULAR | Qty: 1 | Status: AC

## 2021-08-20 ENCOUNTER — Encounter: Payer: Self-pay | Admitting: Internal Medicine

## 2021-08-20 ENCOUNTER — Other Ambulatory Visit: Payer: Self-pay

## 2021-08-20 ENCOUNTER — Inpatient Hospital Stay: Payer: Medicare HMO

## 2021-08-20 ENCOUNTER — Inpatient Hospital Stay: Payer: Medicare HMO | Admitting: Nurse Practitioner

## 2021-08-20 ENCOUNTER — Ambulatory Visit: Payer: Medicare HMO

## 2021-08-20 ENCOUNTER — Inpatient Hospital Stay: Payer: Medicare HMO | Attending: Internal Medicine | Admitting: Internal Medicine

## 2021-08-20 ENCOUNTER — Ambulatory Visit
Admission: RE | Admit: 2021-08-20 | Discharge: 2021-08-20 | Disposition: A | Discharge status: Home / Self Care | Payer: Medicare HMO | Source: Ambulatory Visit | Attending: Radiation Oncology | Admitting: Radiation Oncology

## 2021-08-20 ENCOUNTER — Other Ambulatory Visit: Payer: Medicare HMO

## 2021-08-20 VITALS — BP 97/46 | HR 67 | Temp 97.6°F | Resp 19 | Ht 62.99 in | Wt 208.1 lb

## 2021-08-20 DIAGNOSIS — Z5111 Encounter for antineoplastic chemotherapy: Secondary | ICD-10-CM

## 2021-08-20 DIAGNOSIS — C3412 Malignant neoplasm of upper lobe, left bronchus or lung: Secondary | ICD-10-CM

## 2021-08-20 DIAGNOSIS — C3492 Malignant neoplasm of unspecified part of left bronchus or lung: Secondary | ICD-10-CM | POA: Insufficient documentation

## 2021-08-20 DIAGNOSIS — Z510 Encounter for antineoplastic radiation therapy: Secondary | ICD-10-CM | POA: Diagnosis not present

## 2021-08-20 DIAGNOSIS — Z87891 Personal history of nicotine dependence: Secondary | ICD-10-CM | POA: Diagnosis not present

## 2021-08-20 DIAGNOSIS — Z95828 Presence of other vascular implants and grafts: Secondary | ICD-10-CM

## 2021-08-20 LAB — CMP (CANCER CENTER ONLY)
ALT: 43 U/L (ref 0–44)
AST: 29 U/L (ref 15–41)
Albumin: 2.9 g/dL — ABNORMAL LOW (ref 3.5–5.0)
Alkaline Phosphatase: 72 U/L (ref 38–126)
Anion gap: 2 — ABNORMAL LOW (ref 5–15)
BUN: 12 mg/dL (ref 8–23)
CO2: >45 mmol/L — ABNORMAL HIGH (ref 22–32)
Calcium: 8.7 mg/dL — ABNORMAL LOW (ref 8.9–10.3)
Chloride: 93 mmol/L — ABNORMAL LOW (ref 98–111)
Creatinine: 0.83 mg/dL (ref 0.44–1.00)
GFR, Estimated: >60 mL/min (ref >60)
Glucose, Bld: 129 mg/dL — ABNORMAL HIGH (ref 70–99)
Potassium: 3.9 mmol/L (ref 3.5–5.1)
Sodium: 142 mmol/L (ref 135–145)
Total Bilirubin: 0.4 mg/dL (ref 0.3–1.2)
Total Protein: 6.1 g/dL — ABNORMAL LOW (ref 6.5–8.1)

## 2021-08-20 LAB — CBC WITH DIFFERENTIAL (CANCER CENTER ONLY)
Abs Immature Granulocytes: 0.09 10*3/uL — ABNORMAL HIGH (ref 0.00–0.07)
Basophils Absolute: 0 10*3/uL (ref 0.0–0.1)
Basophils Relative: 1 %
Eosinophils Absolute: 0.3 10*3/uL (ref 0.0–0.5)
Eosinophils Relative: 4 %
HCT: 32.3 % — ABNORMAL LOW (ref 36.0–46.0)
Hemoglobin: 9.3 g/dL — ABNORMAL LOW (ref 12.0–15.0)
Immature Granulocytes: 1 %
Lymphocytes Relative: 9 %
Lymphs Abs: 0.6 10*3/uL — ABNORMAL LOW (ref 0.7–4.0)
MCH: 26.1 pg (ref 26.0–34.0)
MCHC: 28.8 g/dL — ABNORMAL LOW (ref 30.0–36.0)
MCV: 90.7 fL (ref 80.0–100.0)
Monocytes Absolute: 0.6 10*3/uL (ref 0.1–1.0)
Monocytes Relative: 8 %
Neutro Abs: 5.6 10*3/uL (ref 1.7–7.7)
Neutrophils Relative %: 77 %
Platelet Count: 278 10*3/uL (ref 150–400)
RBC: 3.56 MIL/uL — ABNORMAL LOW (ref 3.87–5.11)
RDW: 16.1 % — ABNORMAL HIGH (ref 11.5–15.5)
WBC Count: 7.2 10*3/uL (ref 4.0–10.5)
nRBC: 0 % (ref 0.0–0.2)

## 2021-08-20 LAB — RAD ONC ARIA SESSION SUMMARY
Course Elapsed Days: 18
Plan Fractions Treated to Date: 10
Plan Prescribed Dose Per Fraction: 2 Gy
Plan Total Fractions Prescribed: 30
Plan Total Prescribed Dose: 60 Gy
Reference Point Dosage Given to Date: 20 Gy
Reference Point Session Dosage Given: 2 Gy
Session Number: 10

## 2021-08-20 MED ORDER — DIPHENHYDRAMINE HCL 50 MG/ML IJ SOLN
50.0000 mg | Freq: Once | INTRAMUSCULAR | Status: AC
  Administered 2021-08-20: 50 mg via INTRAVENOUS
  Filled 2021-08-20: qty 1

## 2021-08-20 MED ORDER — SODIUM CHLORIDE 0.9 % IV SOLN
45.0000 mg/m2 | Freq: Once | INTRAVENOUS | Status: AC
  Administered 2021-08-20: 90 mg via INTRAVENOUS
  Filled 2021-08-20: qty 15

## 2021-08-20 MED ORDER — PALONOSETRON HCL INJECTION 0.25 MG/5ML
0.2500 mg | Freq: Once | INTRAVENOUS | Status: AC
  Administered 2021-08-20: 0.25 mg via INTRAVENOUS
  Filled 2021-08-20: qty 5

## 2021-08-20 MED ORDER — SODIUM CHLORIDE 0.9% FLUSH
10.0000 mL | Freq: Once | INTRAVENOUS | Status: AC
  Administered 2021-08-20: 10 mL

## 2021-08-20 MED ORDER — SODIUM CHLORIDE 0.9 % IV SOLN: Freq: Once | INTRAVENOUS | Status: AC

## 2021-08-20 MED ORDER — SODIUM CHLORIDE 0.9 % IV SOLN
197.4000 mg | Freq: Once | INTRAVENOUS | Status: AC
  Administered 2021-08-20: 200 mg via INTRAVENOUS
  Filled 2021-08-20: qty 20

## 2021-08-20 MED ORDER — SODIUM CHLORIDE 0.9% FLUSH
10.0000 mL | INTRAVENOUS | Status: DC | PRN
  Administered 2021-08-20: 10 mL

## 2021-08-20 MED ORDER — HEPARIN SOD (PORK) LOCK FLUSH 100 UNIT/ML IV SOLN
500.0000 [IU] | Freq: Once | INTRAVENOUS | Status: AC | PRN
  Administered 2021-08-20: 500 [IU]

## 2021-08-20 MED ORDER — SODIUM CHLORIDE 0.9 % IV SOLN
10.0000 mg | Freq: Once | INTRAVENOUS | Status: AC
  Administered 2021-08-20: 10 mg via INTRAVENOUS
  Filled 2021-08-20: qty 10

## 2021-08-20 MED ORDER — FAMOTIDINE IN NACL 20-0.9 MG/50ML-% IV SOLN
20.0000 mg | Freq: Once | INTRAVENOUS | Status: AC
  Administered 2021-08-20: 20 mg via INTRAVENOUS
  Filled 2021-08-20: qty 50

## 2021-08-20 NOTE — Progress Notes (Signed)
[facility] [phone]   [phone]  OFFICE PROGRESS NOTE  [name], NP [place] [address]  DIAGNOSIS:  Stage IIb/IIIa (T3, N0/N2, M0) non-small cell lung cancer, squamous cell carcinoma presented with left hilar mass with suspicious mediastinal lymphadenopathy on the last CT scan of the chest at [facility].  PRIOR THERAPY: None  CURRENT THERAPY: A course of concurrent chemoradiation with weekly carboplatin for AUC of 2 and paclitaxel 45 Mg/M2.  Status post 1 cycle.  INTERVAL HISTORY: [name] 76 y.o. female returns to the clinic today for follow-up visit accompanied by her husband.  The patient is feeling fine today with no concerning complaints except for the baseline shortness of breath and she is currently on home oxygen.  She tolerated the first week of her treatment fairly well except for mild nausea and a low-grade fever improved with her antiemetics as well as Tylenol.  She denied having any chest pain but continues to have mild cough with no hemoptysis.  She has no nausea, vomiting, diarrhea or constipation.  She has no headache or visual changes.  She is here today for evaluation before starting cycle #2 of her treatment.  MEDICAL HISTORY: Past Medical History:  Diagnosis Date   CAD (coronary artery disease)    Cancer ([place])    COPD (chronic obstructive pulmonary disease) ([place])    per DM note, pt denies   Dyslipidemia    GERD (gastroesophageal reflux disease)    History of radiation therapy    Left lung- 01/16/21-01/30/21- Dr. Gery Pray   HTN (hypertension)    Hypothyroidism    Myocardial infarct [facility])    Non-small cell lung cancer ([place]) 08/19/2010   Stage IA, status post left upper lobectomy July 2012   Tobacco abuse     ALLERGIES:  is allergic to codeine.  MEDICATIONS:  Current Outpatient Medications  Medication Sig Dispense Refill   acetaminophen (TYLENOL) 500 MG tablet  Take 500 mg by mouth every 6 (six) hours as needed for moderate pain or fever.     albuterol (PROAIR HFA) 108 (90 Base) MCG/ACT inhaler 2 puffs up to every 4 hours as needed only  if your can't catch your breath (Patient taking differently: Inhale 2 puffs into the lungs every 4 (four) hours as needed for shortness of breath.)     albuterol (PROVENTIL) (2.5 MG/3ML) 0.083% nebulizer solution Take 2.5 mg by nebulization 3 (three) times daily as needed for wheezing or shortness of breath.     amiodarone (PACERONE) 200 MG tablet Take 1 tablet (200 mg total) by mouth 2 (two) times daily. 14 tablet 0   [START ON 08/25/2021] amiodarone (PACERONE) 200 MG tablet Advised to take amiodarone 400 mg twice daily for 7 days followed by amiodarone 200 mg twice daily for 7 days then followed by amiodarone 200 mg daily until follow-up with cardiology. 50 tablet 0   aspirin 81 MG tablet Take 81 mg by mouth at bedtime.     chlorpheniramine-HYDROcodone 10-8 MG/5ML Take 5 mLs by mouth at bedtime as needed for cough.     Coenzyme Q10 (CO Q 10 PO) Take 1 capsule by mouth daily.     fluconazole (DIFLUCAN) 100 MG tablet Take 1 tablet (100 mg total) by mouth daily. (Patient taking differently: Take 100 mg by mouth See admin instructions. Qd x 10 days) 10 tablet 0   furosemide (LASIX) 20 MG tablet Take 1 tablet po  BID for 5 days then go back to taking 1 tablet po QD 90 tablet 1   levothyroxine (SYNTHROID) 25 MCG tablet TAKE 0.5 TABLETS (12.5 MCG TOTAL) BY MOUTH DAILY. IN ADDITION TO THE 50 MCG. (Patient taking differently: Take 12.5 mcg by mouth See admin instructions. 12.5 mcg daily except on sunday) 45 tablet 1   levothyroxine (SYNTHROID) 50 MCG tablet TAKE 1 TABLET BY MOUTH EVERY DAY BEFORE BREAKFAST (Patient taking differently: Take 50 mcg by mouth See admin instructions. Along with 12.5 mcg qd except on Sunday (pt just takes 50 mcg)) 90 tablet 3   lidocaine-prilocaine (EMLA) cream Apply 1 Application topically as needed. (Patient  taking differently: Apply 1 Application topically as needed (prior to port access).) 30 g 2   metoprolol succinate (TOPROL-XL) 100 MG 24 hr tablet Take 1 tablet (100 mg total) by mouth daily. Take with or immediately following a meal. (Patient taking differently: Take 100 mg by mouth daily.) 90 tablet 1   Omega-3 Fatty Acids (OMEGA-3 PO) Take 1,000 mg by mouth in the morning and at bedtime.     OXYGEN Inhale 3.5 L into the lungs continuous.     potassium chloride SA (KLOR-CON M) 10 MEQ tablet Take 1 tablet (10 mEq total) by mouth daily. 30 tablet 3   prochlorperazine (COMPAZINE) 10 MG tablet Take 1 tablet (10 mg total) by mouth every 6 (six) hours as needed. (Patient taking differently: Take 10 mg by mouth every 6 (six) hours as needed for nausea or vomiting.) 30 tablet 2   rosuvastatin (CRESTOR) 40 MG tablet Take 1 tablet (40 mg total) by mouth daily. (Patient taking differently: Take 40 mg by mouth every evening.) 90 tablet 3   No current facility-administered medications for this visit.    SURGICAL HISTORY:  Past Surgical History:  Procedure Laterality Date   CARDIAC CATHETERIZATION  07/06/2010   see CABG report - pt sent to OR   CARDIOVASCULAR STRESS TEST  09/11/2010   R/S MV - normal perfusion in all regions, EF 46%, no scintigraphic evidence of inducible myocardial ischemia; global LV systolic function mildly reduced; no significant wall motion abnormalities noted; Exercise capacity 7 METS; EKG negative for ischemia; low risk study, no signifcant change from previous study 08/2003   CORONARY ARTERY BYPASS GRAFT  07/11/2010   LIMA to LAD; SVG to 2nd branch OM; SVG to posterior descending artery   IR IMAGING GUIDED PORT INSERTION  08/16/2021   LEFT VATS  09/17/2010   TEE WITHOUT CARDIOVERSION  07/06/2010   during emergent CABG surgery; 2-3+ mitral regurgitation   VIDEO BRONCHOSCOPY N/A 07/27/2021   Procedure: VIDEO BRONCHOSCOPY;  Surgeon: Melrose Nakayama, MD;  Location: [facility];  Service:  Thoracic;  Laterality: N/A;   VIDEO BRONCHOSCOPY WITH ENDOBRONCHIAL ULTRASOUND N/A 07/27/2021   Procedure: VIDEO BRONCHOSCOPY WITH ENDOBRONCHIAL ULTRASOUND;  Surgeon: Melrose Nakayama, MD;  Location: [facility];  Service: Thoracic;  Laterality: N/A;    REVIEW OF SYSTEMS:  A comprehensive review of systems was negative except for: Constitutional: positive for fatigue Respiratory: positive for dyspnea on exertion   PHYSICAL EXAMINATION: General appearance: alert, cooperative, fatigued, and no distress Head: Normocephalic, without obvious abnormality, atraumatic Neck: no adenopathy, no JVD, supple, symmetrical, trachea midline, and thyroid not enlarged, symmetric, no tenderness/mass/nodules Lymph nodes: Cervical, supraclavicular, and axillary nodes normal. Resp: wheezes bilaterally Back: symmetric, no curvature. ROM normal. No CVA tenderness. Cardio: regular rate and rhythm, S1, S2 normal, no murmur, click, rub or gallop GI: soft, non-tender; bowel sounds  normal; no masses,  no organomegaly Extremities: extremities normal, atraumatic, no cyanosis or edema  ECOG PERFORMANCE STATUS: 1 - Symptomatic but completely ambulatory  There were no vitals taken for this visit.  LABORATORY DATA: Lab Results  Component Value Date   WBC 10.4 08/13/2021   HGB 10.2 (L) 08/13/2021   HCT 35.6 (L) 08/13/2021   MCV 91.8 08/13/2021   PLT 402 (H) 08/13/2021      Chemistry      Component Value Date/Time   NA 143 08/13/2021 1028   NA 143 05/02/2021 1055   K 3.4 (L) 08/13/2021 1028   CL 93 (L) 08/13/2021 1028   CO2 >45 (H) 08/13/2021 1028   BUN 9 08/13/2021 1028   BUN 13 05/02/2021 1055   CREATININE 0.77 08/13/2021 1028   CREATININE 0.81 07/02/2016 1323      Component Value Date/Time   CALCIUM 9.2 08/13/2021 1028   ALKPHOS 82 08/13/2021 1028   AST 38 08/13/2021 1028   ALT 73 (H) 08/13/2021 1028   BILITOT 0.3 08/13/2021 1028       RADIOGRAPHIC STUDIES: IR IMAGING GUIDED PORT  INSERTION  Result Date: 08/16/2021 INDICATION: 76 year old female referred for port catheter We will be unable to proceed with sedation given the patient's known left-sided lung collapse, oxygen requirement, and CO2 retention. EXAM: IMAGE GUIDED PORT CATHETER MEDICATIONS: None ANESTHESIA/SEDATION: Moderate (conscious) sedation was not employed during this procedure. A total of Versed 0.5 mg and Fentanyl 0 mcg was administered intravenously. Moderate Sedation Time: 0 minutes. The patient's level of consciousness and vital signs were monitored continuously by radiology nursing throughout the procedure under my direct supervision. FLUOROSCOPY TIME:  Fluoroscopy Time: 0 minutes 30 seconds (6 mGy). COMPLICATIONS: None PROCEDURE: The procedure, risks, benefits, and alternatives were explained to the patient. Questions regarding the procedure were encouraged and answered. The patient understands and consents to the procedure. Ultrasound survey was performed with images stored and sent to PACs. Right IJ vein documented to be patent. The right neck and chest was prepped with chlorhexidine, and draped in the usual sterile fashion using maximum barrier technique (cap and mask, sterile gown, sterile gloves, large sterile sheet, hand hygiene and cutaneous antiseptic). Local anesthesia was attained by infiltration with 1% lidocaine without epinephrine. Ultrasound demonstrated patency of the right internal jugular vein, and this was documented with an image. Under real-time ultrasound guidance, this vein was accessed with a 21 gauge micropuncture needle and image documentation was performed. A small dermatotomy was made at the access site with an 11 scalpel. A 0.018" wire was advanced into the SVC and used to estimate the length of the internal catheter. The access needle exchanged for a 84F micropuncture vascular sheath. The 0.018" wire was then removed and a 0.035" wire advanced into the IVC. An appropriate location for the  subcutaneous reservoir was selected below the clavicle and an incision was made through the skin and underlying soft tissues. The subcutaneous tissues were then dissected using a combination of blunt and sharp surgical technique and a pocket was formed. A single lumen power injectable portacatheter was then tunneled through the subcutaneous tissues from the pocket to the dermatotomy and the port reservoir placed within the subcutaneous pocket. The venous access site was then serially dilated and a peel away vascular sheath placed over the wire. The wire was removed and the port catheter advanced into position under fluoroscopic guidance. Some redundancy in the catheter was reduced by removing the catheter at the hub of the port apparatus and  straightening the catheter with the use of an 018 wire. The catheter was then reattached at the hub and check for leaks. The catheter tip is positioned in the cavoatrial junction. This was documented with a spot image. The portacatheter was then tested and found to flush and aspirate well. The port was flushed with saline followed by 100 units/mL heparinized saline. The pocket was then closed in two layers using first subdermal inverted interrupted absorbable sutures followed by a running subcuticular suture. The epidermis was then sealed with Dermabond. The dermatotomy at the venous access site was also seal with Dermabond. Patient tolerated the procedure well and remained hemodynamically stable throughout. No complications encountered and no significant blood loss encountered IMPRESSION: Status post right IJ port catheter placement. Signed, Dulcy Fanny. Nadene Rubins, RPVI [facility] Electronically Signed   By: Corrie Mckusick D.O.   On: 08/16/2021 15:14   MR BRAIN W WO CONTRAST  Result Date: 08/10/2021 CLINICAL DATA:  Evaluate for metastatic disease EXAM: MRI HEAD WITHOUT AND WITH CONTRAST TECHNIQUE: Multiplanar,  multiecho pulse sequences of the brain and surrounding structures were obtained without and with intravenous contrast. CONTRAST:  9.60mL GADAVIST GADOBUTROL 1 MMOL/ML IV SOLN COMPARISON:  CT head dated 1 day prior FINDINGS: Brain: There is no acute intracranial hemorrhage, extra-axial fluid collection, or acute infarct. Parenchymal volume is normal for age. The ventricles are normal in size. Patchy FLAIR signal abnormality throughout the subcortical and periventricular white matter likely reflects sequela of chronic white matter microangiopathy. Curvilinear SWI signal dropout in both parieto-occipital regions is consistent with sequela of prior hemorrhage. Additional punctate chronic microhemorrhages are noted both frontal lobes predominantly peripherally. There is no abnormal enhancement. The finding on the prior head CT was likely artifactual, as there is no abnormal enhancement in the pons on the current study. There is no suspicious parenchymal signal abnormality. There is no mass effect or midline shift. Vascular: Normal flow voids. Skull and upper cervical spine: Normal marrow signal. Sinuses/Orbits: The paranasal sinuses are clear. The globes and orbits are unremarkable. Other: None. IMPRESSION: 1. No evidence of intracranial metastatic disease. The finding on the prior head CT was likely artifactual. 2. Scattered foci of chronic microhemorrhage predominantly peripherally distributed raise suspicion for cerebral amyloid angiopathy. Electronically Signed   By: Valetta Mole M.D.   On: 08/10/2021 16:20   CT Head W or Wo Contrast  Result Date: 08/09/2021 CLINICAL DATA:  Non-small cell lung cancer, evaluate for brain Mets prior to anticoagulation EXAM: CT HEAD WITHOUT AND WITH CONTRAST TECHNIQUE: Contiguous axial images were obtained from the base of the skull through the vertex without and with intravenous contrast. RADIATION DOSE REDUCTION: This exam was performed according to the departmental  dose-optimization program which includes automated exposure control, adjustment of the mA and/or kV according to patient size and/or use of iterative reconstruction technique. CONTRAST:  63mL OMNIPAQUE IOHEXOL 300 MG/ML  SOLN COMPARISON:  None Available. FINDINGS: Brain: 3 mm focus of enhancement in the pons (series 11, image 27), which is nonspecific and could represent an enhancing lesion versus a vascular lesion such as a capillary telangiectasia. No additional foci of enhancement are seen. No evidence of acute infarction, hemorrhage, cerebral edema, mass effect, or midline shift. No hydrocephalus or extra-axial fluid collection. Vascular: No hyperdense vessel. Normal enhancement of the circle-of-Willis. Skull: Hyperostosis frontalis. Negative for fracture or focal lesion. Sinuses/Orbits: No acute finding. Other: The mastoid air cells are well aerated. IMPRESSION: 3 mm focus of enhancement  in the pons, which is nonspecific and could represent an enhancing lesion versus a vascular lesion such as a capillary telangiectasia. An MRI of the brain with and without contrast may help distinguish the etiology, and is far more sensitive for small metastatic lesions. Electronically Signed   By: Merilyn Baba M.D.   On: 08/09/2021 02:05   CT CHEST W CONTRAST  Result Date: 08/07/2021 CLINICAL DATA:  Pleural effusion, malignancy suspected EXAM: CT CHEST WITH CONTRAST TECHNIQUE: Multidetector CT imaging of the chest was performed during intravenous contrast administration. RADIATION DOSE REDUCTION: This exam was performed according to the departmental dose-optimization program which includes automated exposure control, adjustment of the mA and/or kV according to patient size and/or use of iterative reconstruction technique. CONTRAST:  51mL OMNIPAQUE IOHEXOL 300 MG/ML  SOLN COMPARISON:  Pet CT 06/08/2021, chest x-ray 07/25/2021, chest x-ray 08/07/2021 FINDINGS: Cardiovascular: Normal heart size. No significant pericardial  effusion. The thoracic aorta is normal in caliber. No atherosclerotic plaque of the thoracic aorta. Four-vessel coronary artery calcifications status post coronary artery bypass graft. The main pulmonary artery is normal in caliber. No central or segmental pulmonary embolus. Mediastinum/Nodes: Known left hilar mass lesion poorly visualized measuring up to at least 4 cm. No right hilar lymphadenopathy. No enlarged mediastinal or axillary lymph nodes. Thyroid gland, trachea, and esophagus demonstrate no significant findings. Lungs/Pleura: Interval complete hypodense heterogeneous opacification of the left lung consistent. Interval development of a at least small volume left pleural effusion. The right lung is clear. No right pleural effusion. No pneumothorax. Upper Abdomen: Subcentimeter splenule (2:124). No acute abnormality. Musculoskeletal: No chest wall abnormality. No suspicious lytic or blastic osseous lesions. No acute displaced fracture. Multilevel degenerative changes of the spine. Intact sternotomy wires. IMPRESSION: Interval development of postobstructive infection/collapse of the entire left lung in the setting of known left hilar mass lesion and interval development of at least small volume left pleural effusion. Electronically Signed   By: Iven Finn M.D.   On: 08/07/2021 17:38   DG Chest Port 1 View  Result Date: 08/07/2021 CLINICAL DATA:  Shortness of breath EXAM: PORTABLE CHEST 1 VIEW COMPARISON:  Chest radiograph dated July 26, 2021 FINDINGS: Evaluation of cardiomediastinal silhouette is limited due to complete opacification of the left hemithorax. Right lung is clear. Sternotomy wires representing prior coronary artery bypass grafting unchanged. IMPRESSION: Complete opacification of the left hemithorax, which may represent atelectasis or effusion. Right lung is clear. Electronically Signed   By: Keane Police D.O.   On: 08/07/2021 14:22   DG Chest 2 View  Result Date: 07/26/2021 CLINICAL  DATA:  Pre-op for video bronchoscopy. EXAM: CHEST - 2 VIEW COMPARISON:  Chest radiograph 07/03/2020.  Head CT 06/08/2021 FINDINGS: Markedly increased densities in the mid and lower chest are suggestive for volume loss and probable left pleural fluid. Streaky densities in left upper lung could represent postobstructive changes. Again noted are past CABG changes. Right lung remains clear. Negative for pneumothorax. Left cardiac border is obscured by the left chest densities. IMPRESSION: Increased densities in the left chest are most compatible with volume loss and probably pleural fluid. Streaky densities in the left upper lung could also represent postobstructive changes. Electronically Signed   By: Markus Daft M.D.   On: 07/26/2021 21:16    ASSESSMENT AND PLAN: This is a very pleasant 76 years old white female with Stage IIb/IIIa (T3, N0/N2, M0) non-small cell lung cancer, squamous cell carcinoma presented with left hilar mass with suspicious mediastinal lymphadenopathy on the last CT  scan of the chest at [facility]. She also has a history of stage Ia non-small cell lung cancer, adenocarcinoma status post surgical resection in July 2012 as well as recurrent stage Ia non-small cell lung cancer, squamous cell carcinoma status post SBRT completed January 30, 2021. MRI of the brain showed no evidence of metastatic disease to the brain.  I discussed the results with the patient and her husband. The patient is currently undergoing a course of concurrent chemoradiation with weekly carboplatin for AUC of 2 and paclitaxel 45 Mg/M2 status post 1 cycle.  She tolerated the first week of her treatment fairly well. I recommended for the patient to proceed with cycle #2 today as planned. I will see her back for follow-up visit in 2 weeks for evaluation before cycle #4. For the COPD, the patient will continue with home oxygen. She was advised to call immediately if she has any concerning symptoms in the interval. The  patient voices understanding of current disease status and treatment options and is in agreement with the current care plan.  All questions were answered. The patient knows to call the clinic with any problems, questions or concerns. We can certainly see the patient much sooner if necessary.  The total time spent in the appointment was 20 minutes.  Disclaimer: This note was dictated with voice recognition software. Similar sounding words can inadvertently be transcribed and may not be corrected upon review.

## 2021-08-20 NOTE — Patient Instructions (Signed)
Carboplatin injection What is this medication? CARBOPLATIN (KAR boe pla tin) is a chemotherapy drug. It targets fast dividing cells, like cancer cells, and causes these cells to die. This medicine is used to treat ovarian cancer and many other cancers. This medicine may be used for other purposes; ask your health care provider or pharmacist if you have questions. COMMON BRAND NAME(S): Paraplatin What should I tell my care team before I take this medication? They need to know if you have any of these conditions: blood disorders hearing problems kidney disease recent or ongoing radiation therapy an unusual or allergic reaction to carboplatin, cisplatin, other chemotherapy, other medicines, foods, dyes, or preservatives pregnant or trying to get pregnant breast-feeding How should I use this medication? This drug is usually given as an infusion into a vein. It is administered in a hospital or clinic by a specially trained health care professional. Talk to your pediatrician regarding the use of this medicine in children. Special care may be needed. Overdosage: If you think you have taken too much of this medicine contact a poison control center or emergency room at once. NOTE: This medicine is only for you. Do not share this medicine with others. What if I miss a dose? It is important not to miss a dose. Call your doctor or health care professional if you are unable to keep an appointment. What may interact with this medication? medicines for seizures medicines to increase blood counts like filgrastim, pegfilgrastim, sargramostim some antibiotics like amikacin, gentamicin, neomycin, streptomycin, tobramycin vaccines Talk to your doctor or health care professional before taking any of these medicines: acetaminophen aspirin ibuprofen ketoprofen naproxen This list may not describe all possible interactions. Give your health care provider a list of all the medicines, herbs, non-prescription  drugs, or dietary supplements you use. Also tell them if you smoke, drink alcohol, or use illegal drugs. Some items may interact with your medicine. What should I watch for while using this medication? Your condition will be monitored carefully while you are receiving this medicine. You will need important blood work done while you are taking this medicine. This drug may make you feel generally unwell. This is not uncommon, as chemotherapy can affect healthy cells as well as cancer cells. Report any side effects. Continue your course of treatment even though you feel ill unless your doctor tells you to stop. In some cases, you may be given additional medicines to help with side effects. Follow all directions for their use. Call your doctor or health care professional for advice if you get a fever, chills or sore throat, or other symptoms of a cold or flu. Do not treat yourself. This drug decreases your body's ability to fight infections. Try to avoid being around people who are sick. This medicine may increase your risk to bruise or bleed. Call your doctor or health care professional if you notice any unusual bleeding. Be careful brushing and flossing your teeth or using a toothpick because you may get an infection or bleed more easily. If you have any dental work done, tell your dentist you are receiving this medicine. Avoid taking products that contain aspirin, acetaminophen, ibuprofen, naproxen, or ketoprofen unless instructed by your doctor. These medicines may hide a fever. Do not become pregnant while taking this medicine. Women should inform their doctor if they wish to become pregnant or think they might be pregnant. There is a potential for serious side effects to an unborn child. Talk to your health care professional or pharmacist  for more information. Do not breast-feed an infant while taking this medicine. What side effects may I notice from receiving this medication? Side effects that you  should report to your doctor or health care professional as soon as possible: allergic reactions like skin rash, itching or hives, swelling of the face, lips, or tongue signs of infection - fever or chills, cough, sore throat, pain or difficulty passing urine signs of decreased platelets or bleeding - bruising, pinpoint red spots on the skin, black, tarry stools, nosebleeds signs of decreased red blood cells - unusually weak or tired, fainting spells, lightheadedness breathing problems changes in hearing changes in vision chest pain high blood pressure low blood counts - This drug may decrease the number of white blood cells, red blood cells and platelets. You may be at increased risk for infections and bleeding. nausea and vomiting pain, swelling, redness or irritation at the injection site pain, tingling, numbness in the hands or feet problems with balance, talking, walking trouble passing urine or change in the amount of urine Side effects that usually do not require medical attention (report to your doctor or health care professional if they continue or are bothersome): hair loss loss of appetite metallic taste in the mouth or changes in taste This list may not describe all possible side effects. Call your doctor for medical advice about side effects. You may report side effects to FDA at 1-800-FDA-1088. Where should I keep my medication? This drug is given in a hospital or clinic and will not be stored at home. NOTE: This sheet is a summary. It may not cover all possible information. If you have questions about this medicine, talk to your doctor, pharmacist, or health care provider.  2023 Elsevier/Gold Standard (2007-07-15 00:00:00)      Paclitaxel injection What is this medication? PACLITAXEL (PAK li TAX el) is a chemotherapy drug. It targets fast dividing cells, like cancer cells, and causes these cells to die. This medicine is used to treat ovarian cancer, breast cancer, lung  cancer, Kaposi's sarcoma, and other cancers. This medicine may be used for other purposes; ask your health care provider or pharmacist if you have questions. COMMON BRAND NAME(S): Onxol, Taxol What should I tell my care team before I take this medication? They need to know if you have any of these conditions: history of irregular heartbeat liver disease low blood counts, like low white cell, platelet, or red cell counts lung or breathing disease, like asthma tingling of the fingers or toes, or other nerve disorder an unusual or allergic reaction to paclitaxel, alcohol, polyoxyethylated castor oil, other chemotherapy, other medicines, foods, dyes, or preservatives pregnant or trying to get pregnant breast-feeding How should I use this medication? This drug is given as an infusion into a vein. It is administered in a hospital or clinic by a specially trained health care professional. Talk to your pediatrician regarding the use of this medicine in children. Special care may be needed. Overdosage: If you think you have taken too much of this medicine contact a poison control center or emergency room at once. NOTE: This medicine is only for you. Do not share this medicine with others. What if I miss a dose? It is important not to miss your dose. Call your doctor or health care professional if you are unable to keep an appointment. What may interact with this medication? Do not take this medicine with any of the following medications: live virus vaccines This medicine may also interact with the following  medications: antiviral medicines for hepatitis, HIV or AIDS certain antibiotics like erythromycin and clarithromycin certain medicines for fungal infections like ketoconazole and itraconazole certain medicines for seizures like carbamazepine, phenobarbital, phenytoin gemfibrozil nefazodone rifampin St. John's wort This list may not describe all possible interactions. Give your health care  provider a list of all the medicines, herbs, non-prescription drugs, or dietary supplements you use. Also tell them if you smoke, drink alcohol, or use illegal drugs. Some items may interact with your medicine. What should I watch for while using this medication? Your condition will be monitored carefully while you are receiving this medicine. You will need important blood work done while you are taking this medicine. This medicine can cause serious allergic reactions. To reduce your risk you will need to take other medicine(s) before treatment with this medicine. If you experience allergic reactions like skin rash, itching or hives, swelling of the face, lips, or tongue, tell your doctor or health care professional right away. In some cases, you may be given additional medicines to help with side effects. Follow all directions for their use. This drug may make you feel generally unwell. This is not uncommon, as chemotherapy can affect healthy cells as well as cancer cells. Report any side effects. Continue your course of treatment even though you feel ill unless your doctor tells you to stop. Call your doctor or health care professional for advice if you get a fever, chills or sore throat, or other symptoms of a cold or flu. Do not treat yourself. This drug decreases your body's ability to fight infections. Try to avoid being around people who are sick. This medicine may increase your risk to bruise or bleed. Call your doctor or health care professional if you notice any unusual bleeding. Be careful brushing and flossing your teeth or using a toothpick because you may get an infection or bleed more easily. If you have any dental work done, tell your dentist you are receiving this medicine. Avoid taking products that contain aspirin, acetaminophen, ibuprofen, naproxen, or ketoprofen unless instructed by your doctor. These medicines may hide a fever. Do not become pregnant while taking this medicine. Women  should inform their doctor if they wish to become pregnant or think they might be pregnant. There is a potential for serious side effects to an unborn child. Talk to your health care professional or pharmacist for more information. Do not breast-feed an infant while taking this medicine. Men are advised not to father a child while receiving this medicine. This product may contain alcohol. Ask your pharmacist or healthcare provider if this medicine contains alcohol. Be sure to tell all healthcare providers you are taking this medicine. Certain medicines, like metronidazole and disulfiram, can cause an unpleasant reaction when taken with alcohol. The reaction includes flushing, headache, nausea, vomiting, sweating, and increased thirst. The reaction can last from 30 minutes to several hours. What side effects may I notice from receiving this medication? Side effects that you should report to your doctor or health care professional as soon as possible: allergic reactions like skin rash, itching or hives, swelling of the face, lips, or tongue breathing problems changes in vision fast, irregular heartbeat high or low blood pressure mouth sores pain, tingling, numbness in the hands or feet signs of decreased platelets or bleeding - bruising, pinpoint red spots on the skin, black, tarry stools, blood in the urine signs of decreased red blood cells - unusually weak or tired, feeling faint or lightheaded, falls signs of infection -  fever or chills, cough, sore throat, pain or difficulty passing urine signs and symptoms of liver injury like dark yellow or brown urine; general ill feeling or flu-like symptoms; light-colored stools; loss of appetite; nausea; right upper belly pain; unusually weak or tired; yellowing of the eyes or skin swelling of the ankles, feet, hands unusually slow heartbeat Side effects that usually do not require medical attention (report to your doctor or health care professional if  they continue or are bothersome): diarrhea hair loss loss of appetite muscle or joint pain nausea, vomiting pain, redness, or irritation at site where injected tiredness This list may not describe all possible side effects. Call your doctor for medical advice about side effects. You may report side effects to FDA at 1-800-FDA-1088. Where should I keep my medication? This drug is given in a hospital or clinic and will not be stored at home. NOTE: This sheet is a summary. It may not cover all possible information. If you have questions about this medicine, talk to your doctor, pharmacist, or health care provider.  2023 Elsevier/Gold Standard (2021-01-05 00:00:00)

## 2021-08-20 NOTE — Patient Instructions (Signed)
Thank you for choosing [facility] to provide your care.   Should you have questions after your visit to the [facility]), please contact this office at [phone] between 8:30 AM and 4:30 PM.  Voice mails left after 4:00 PM may not be returned until the following business day.  Calls received after 4:30 PM will be answered by an off-site Nurse Triage Line.    Prescription Refills:  Please have your pharmacy contact us directly for most prescription requests.  Contact the office directly for refills of narcotics (pain medications). Allow 48-72 hours for refills.  Appointments: Please contact the [facility] scheduling department [phone] for questions regarding [facility] appointment scheduling.  Contact the schedulers with any scheduling changes so that your appointment can be rescheduled in a timely manner.   Central Scheduling for [facility] [phone] - Call to schedule procedures such as PET scans, CT scans, MRI, Ultrasound, etc.  To afford each patient quality time with our providers, please arrive 30 minutes before your scheduled appointment time.  If you arrive late for your appointment, you may be asked to reschedule.  We strive to give you quality time with our providers, and arriving late affects you and other patients whose appointments are after yours. If you are a no show for multiple scheduled visits, you may be dismissed from the clinic at the providers discretion.     Resources: [place] [employer] [phone] for additional information on assistance programs or assistance connecting with community support programs   [place]  [phone]: Information regarding food stamps, Medicaid, and utility assistance [employer]'s shared-ride transportation service for eligible riders who have a disability that prevents them from riding the fixed route bus.   [place]  ([phone] Helps people with Medicare understand their rights and benefits, navigate the Medicare system, and secure the quality healthcare they deserve American [employer] [phone] Assists patients locate various types of support and financial assistance Cancer Care: 1-800-813-HOPE 336-378-4108) Provides financial assistance, online support groups, medication/co-pay assistance.   Transportation Assistance for appointments at [facility]:[phone]  Again, thank you for choosing [facility] for your care.      Steps to Quit Smoking Smoking tobacco is the leading cause of preventable death. It can affect almost every organ in the body. Smoking puts you and people around you at risk for many serious, long-lasting (chronic) diseases. Quitting smoking can be hard, but it is one of the best things that you can do for your health. It is never too late to quit. Do not give up if you cannot quit the first time. Some people need to try many times to quit. Do your best to stick to your quit plan, and talk with your doctor if you have any questions or concerns. How do I get ready to quit? Pick a date to quit. Set a date within the next 2 weeks to give you time to prepare. Write down the reasons why you are quitting. Keep this list in places where you will see it often. Tell your family, friends, and co-workers that you are quitting. Their support is important. Talk with your doctor about the choices that may help you quit. Find out if your health insurance will pay for these treatments. Know the people, places, things, and activities that make you want to smoke (triggers). Avoid them. What first steps can I take to quit smoking? Throw away all  cigarettes at home, at work, and in your car. Throw away the things that you use when you smoke, such as ashtrays and lighters. Clean your car. Empty the ashtray. Clean your home, including curtains and carpets. What can I do to help me quit  smoking? Talk with your doctor about taking medicines and seeing a counselor. You are more likely to succeed when you do both. If you are pregnant or breastfeeding: Talk with your doctor about counseling or other ways to quit smoking. Do not take medicine to help you quit smoking unless your doctor tells you to. Quit right away Quit smoking completely, instead of slowly cutting back on how much you smoke over a period of time. Stopping smoking right away may be more successful than slowly quitting. Go to counseling. In-person is best if this is an option. You are more likely to quit if you go to counseling sessions regularly. Take medicine You may take medicines to help you quit. Some medicines need a prescription, and some you can buy over-the-counter. Some medicines may contain a drug called nicotine to replace the nicotine in cigarettes. Medicines may: Help you stop having the desire to smoke (cravings). Help to stop the problems that come when you stop smoking (withdrawal symptoms). Your doctor may ask you to use: Nicotine patches, gum, or lozenges. Nicotine inhalers or sprays. Non-nicotine medicine that you take by mouth. Find resources Find resources and other ways to help you quit smoking and remain smoke-free after you quit. They include: Online chats with a Social worker. Phone quitlines. Printed Furniture conservator/restorer. Support groups or group counseling. Text messaging programs. Mobile phone apps. Use apps on your mobile phone or tablet that can help you stick to your quit plan. Examples of free services include Quit Guide from the CDC and smokefree.gov  What can I do to make it easier to quit?  Talk to your family and friends. Ask them to support and encourage you. Call a phone quitline, such as 1-800-QUIT-NOW, reach out to support groups, or work with a Social worker. Ask people who smoke to not smoke around you. Avoid places that make you want to smoke, such  as: Bars. Parties. Smoke-break areas at work. Spend time with people who do not smoke. Lower the stress in your life. Stress can make you want to smoke. Try these things to lower stress: Getting regular exercise. Doing deep-breathing exercises. Doing yoga. Meditating. What benefits will I see if I quit smoking? Over time, you may have: A better sense of smell and taste. Less coughing and sore throat. A slower heart rate. Lower blood pressure. Clearer skin. Better breathing. Fewer sick days. Summary Quitting smoking can be hard, but it is one of the best things that you can do for your health. Do not give up if you cannot quit the first time. Some people need to try many times to quit. When you decide to quit smoking, make a plan to help you succeed. Quit smoking right away, not slowly over a period of time. When you start quitting, get help and support to keep you smoke-free. This information is not intended to replace advice given to you by your health care provider. Make sure you discuss any questions you have with your health care provider. Document Revised: 01/26/2021 Document Reviewed: 01/26/2021 Elsevier Patient Education  [place].

## 2021-08-20 NOTE — Addendum Note (Signed)
Addended by: Ardeen Garland on: 08/20/2021 09:04 AM   Modules accepted: Orders

## 2021-08-22 ENCOUNTER — Ambulatory Visit: Payer: Medicare HMO

## 2021-08-22 ENCOUNTER — Ambulatory Visit (INDEPENDENT_AMBULATORY_CARE_PROVIDER_SITE_OTHER): Payer: Medicare HMO

## 2021-08-22 ENCOUNTER — Encounter: Payer: Self-pay | Admitting: Nurse Practitioner

## 2021-08-22 ENCOUNTER — Other Ambulatory Visit: Payer: Self-pay

## 2021-08-22 ENCOUNTER — Ambulatory Visit
Admission: RE | Admit: 2021-08-22 | Discharge: 2021-08-22 | Disposition: A | Discharge status: Home / Self Care | Payer: Medicare HMO | Source: Ambulatory Visit | Attending: Radiation Oncology | Admitting: Radiation Oncology

## 2021-08-22 ENCOUNTER — Ambulatory Visit: Payer: Medicare HMO | Admitting: Nurse Practitioner

## 2021-08-22 ENCOUNTER — Telehealth: Payer: Self-pay | Admitting: Nurse Practitioner

## 2021-08-22 VITALS — BP 106/48 | HR 70 | Temp 99.3°F | Ht 63.0 in | Wt 208.8 lb

## 2021-08-22 DIAGNOSIS — C3412 Malignant neoplasm of upper lobe, left bronchus or lung: Secondary | ICD-10-CM | POA: Diagnosis not present

## 2021-08-22 DIAGNOSIS — M954 Acquired deformity of chest and rib: Secondary | ICD-10-CM | POA: Diagnosis not present

## 2021-08-22 DIAGNOSIS — J9819 Other pulmonary collapse: Secondary | ICD-10-CM | POA: Diagnosis not present

## 2021-08-22 DIAGNOSIS — J9612 Chronic respiratory failure with hypercapnia: Secondary | ICD-10-CM | POA: Diagnosis not present

## 2021-08-22 DIAGNOSIS — J9611 Chronic respiratory failure with hypoxia: Secondary | ICD-10-CM

## 2021-08-22 DIAGNOSIS — C3492 Malignant neoplasm of unspecified part of left bronchus or lung: Secondary | ICD-10-CM

## 2021-08-22 DIAGNOSIS — I700 Atherosclerosis of aorta: Secondary | ICD-10-CM | POA: Diagnosis not present

## 2021-08-22 DIAGNOSIS — J9811 Atelectasis: Secondary | ICD-10-CM | POA: Diagnosis not present

## 2021-08-22 DIAGNOSIS — Z510 Encounter for antineoplastic radiation therapy: Secondary | ICD-10-CM | POA: Diagnosis not present

## 2021-08-22 DIAGNOSIS — Z87891 Personal history of nicotine dependence: Secondary | ICD-10-CM | POA: Diagnosis not present

## 2021-08-22 DIAGNOSIS — I517 Cardiomegaly: Secondary | ICD-10-CM | POA: Diagnosis not present

## 2021-08-22 DIAGNOSIS — J449 Chronic obstructive pulmonary disease, unspecified: Secondary | ICD-10-CM | POA: Diagnosis not present

## 2021-08-22 LAB — RAD ONC ARIA SESSION SUMMARY
Course Elapsed Days: 20
Plan Fractions Treated to Date: 11
Plan Prescribed Dose Per Fraction: 2 Gy
Plan Total Fractions Prescribed: 30
Plan Total Prescribed Dose: 60 Gy
Reference Point Dosage Given to Date: 22 Gy
Reference Point Session Dosage Given: 2 Gy
Session Number: 11

## 2021-08-22 MED ORDER — STIOLTO RESPIMAT 2.5-2.5 MCG/ACT IN AERS: 2.0000 | INHALATION_SPRAY | Freq: Every day | RESPIRATORY_TRACT | 0 refills | Status: DC

## 2021-08-22 MED ORDER — STIOLTO RESPIMAT 2.5-2.5 MCG/ACT IN AERS: 2.0000 | INHALATION_SPRAY | Freq: Every day | RESPIRATORY_TRACT | 5 refills | Status: DC

## 2021-08-22 NOTE — Telephone Encounter (Signed)
Patient requesting a handicap placard. Please advise.

## 2021-08-22 NOTE — Assessment & Plan Note (Signed)
Complete left lung atelectasis due to obstructing hilar mass. Clinical and mild radiographic improvement today with 5-10% increased aeration in left lung. She is anticipated to follow up with Dr. Roxan Hockey 7/12 - may be a candidate for VATS in future post chemoradiation (anticipated to complete first round in August) if proximal obstruction is relieved.   Patient Instructions  Start Stiolto 2 puffs daily  Continue Albuterol inhaler 2 puffs or 3 mL neb every 6 hours as needed for shortness of breath or wheezing. Notify if symptoms persist despite rescue inhaler/neb use. Continue supplemental oxygen 3 lpm for goal oxygen saturation >88-90%   Follow up with Dr. Roxan Hockey on 7/12 as previously scheduled.   Follow up in 6 weeks with Dr. Melvyn Novas or Alanson Aly. If symptoms do not improve or worsen, please contact office for sooner follow up or seek emergency care.

## 2021-08-22 NOTE — Assessment & Plan Note (Signed)
On cycle two of concurrent chemoradiation. She is tolerating it well so far. Anticipated to complete course in August. Follow up with Dr. Julien Nordmann as scheduled.

## 2021-08-22 NOTE — Telephone Encounter (Signed)
Do we have a form we can fill out for her and she can pick up and do her part?

## 2021-08-22 NOTE — Assessment & Plan Note (Addendum)
She has DOE at baseline. She tried Incruse in the past, which was helpful, but is not currently on any maintenance inhalers. We will start her on LAMA/LABA and continue PRN albuterol for management of her COPD/emphysema. I did educate her that this may be of little benefit right now given the proximal obstructing mass; however, hopefully if this is resolved/improved, she will notice a difference in her baseline dyspnea.

## 2021-08-22 NOTE — Patient Instructions (Addendum)
Start Stiolto 2 puffs daily  Continue Albuterol inhaler 2 puffs or 3 mL neb every 6 hours as needed for shortness of breath or wheezing. Notify if symptoms persist despite rescue inhaler/neb use. Continue supplemental oxygen 3 lpm at rest. Increase to 4 lpm continuous oxygen with activity for goal oxygen saturation >88-90%   Follow up with Dr. Roxan Hockey on 7/12 as previously scheduled.   Arterial blood gas ordered today - someone will contact you for scheduling. They should be able to get you scheduled same day as one of your upcoming visits to the cancer center so just coordinate this when they call.  Follow up in 6 weeks with Dr. Melvyn Novas or Alanson Aly. If symptoms do not improve or worsen, please contact office for sooner follow up or seek emergency care.

## 2021-08-22 NOTE — Addendum Note (Signed)
Addended by: Fran Lowes on: 08/22/2021 01:55 PM   Modules accepted: Orders

## 2021-08-22 NOTE — Progress Notes (Signed)
@Patient  ID: [name], female    DOB: 23-Nov-1945, 76 y.o.   MRN: [id number]  Chief Complaint  Patient presents with   Follow-up    Patient says things are going good since she left the hospital.     Referring provider: [name], NP  HPI: 76 year old female, active smoker followed for COPD, left recurrent lung cancer, left lung collapse.  She is a patient Dr. Gustavus Bryant and last seen during her hospitalization from 08/08/2021 to 08/11/2021.  Past medical history significant for CAD, CHF, PAF, hypertension, GERD, hypothyroidism, IDA, HLD.  She has a history of left upper lobe adenocarcinoma post lobectomy 2012.  Found to have an enlarging left lower lobe nodule in 2022 and underwent SBRT with resolution.  In May 2023, she was found to have a hypermetabolic large left hilar mass with associated left-sided volume loss.  She underwent bronchoscopy with Dr. Roxan Hockey on 6/9 and was diagnosed with recurrent non-small cell cancer.  The following day she was admitted to [facility] for leg swelling and shortness of breath.  She was found to have a large left-sided pleural effusion which chest tube was placed for.  Effusion did not resolve despite drainage and diuresis.  She was started on steroids and discharged with plans to see radiation oncology the same day.  Radiation was started 6/15 and was repeated daily with plans to start chemotherapy in near future.  She presented to radiation therapy on 6/20 and was noted to be tachycardic-sent to [facility] long ED and found to be in SVT.  CXR during the stay was also significant for complete opacification of the left hemithorax; no intervention.  She was treated with IV metoprolol and discharged home.  She went back to the ED on 6/21 with palpitations and hot flashes.  She was found to be in SVT again and given 2 doses of adenosine, which was unsuccessful.  She then received IV beta-blocker with rate control and A-fib was seen; cardiology  consulted.  During her most recent hospital stay, a repeat CT scan showed full left lung atelectasis, proximal left hilar mass and pleural fluid with suspected partial loculations.  PCCM was consulted to discuss possible intervention.  It was decided that there was no indication for chest tube placement as she did not have any previous improvement with this due to proximal obstruction.  There was also no role for VATS decortication.  Recommended waiting until she completed radiation and started chemotherapy to reevaluate to see if proximal obstruction had resolved/improved and if she would be a candidate for VATS or Pleurx catheter. She was discharged on 6/24 with plans to follow up outpatient.   TEST/EVENTS:  04/2015 spiro: ratio 59, FEV1 39 09/11/2020 PFTs: FVC 47, FEV1 52, ratio 78, TLC 59, DLCOcor 48 08/07/2021 CT chest with contrast: Atherosclerosis/CAD.  There is no evidence of central or segmental PE.  The known left hilar mass is poorly visualized and measuring up to 4 cm.  There is interval complete hypodense heterogeneous opacification of the left lung.  There is interval development of at least a small volume of left pleural fluid.  The right lung is clear.  08/22/2021: Today-follow-up Patient presents today for hospital follow-up.  Since she was discharged, she has started concurrent chemoradiation.  She completed cycle 1 of carboplatin/paclitaxel and did not have any major side effects.  She started cycle 2 this week. She reports that her breathing is overall stable since discharge, possibly with slight improvement in activity tolerance. She does  not feel back to her baseline and still gets winded with daily activities such as dressing. She also has begun to cough up some clear sputum again, which is normal for her. She denies any hemoptysis, wheezing, fevers, night sweats, weight loss. She rarely uses her albuterol rescue or nebulizer, but has felt like the nebulizer helps in the past. She was  also previously on Incruse but stopped it because she wasn't sure how to use it properly. She did feel like it helped her breathing though.   Allergies  Allergen Reactions   Codeine Other (See Comments)    Unknown reaction Patient avoids this medication    Immunization History  Administered Date(s) Administered   Pneumococcal Polysaccharide-23 01/28/2019   Tdap 02/02/2019   Zoster Recombinat (Shingrix) 06/14/2019    Past Medical History:  Diagnosis Date   CAD (coronary artery disease)    Cancer (HCC)    COPD (chronic obstructive pulmonary disease) ([place])    per DM note, pt denies   Dyslipidemia    GERD (gastroesophageal reflux disease)    History of radiation therapy    Left lung- 01/16/21-01/30/21- Dr. Gery Pray   HTN (hypertension)    Hypothyroidism    Myocardial infarct [facility])    Non-small cell lung cancer ([place]) 08/19/2010   Stage IA, status post left upper lobectomy July 2012   Tobacco abuse     Tobacco History: Social History   Tobacco Use  Smoking Status Former   Packs/day: 1.00   Years: 58.00   Total pack years: 58.00   Types: Cigarettes   Quit date: 03/21/2020   Years since quitting: 1.4  Smokeless Tobacco Former  Tobacco Comments   Pt states she is currently smoking   Counseling given: Not Answered Tobacco comments: Pt states she is currently smoking   Outpatient Medications Prior to Visit  Medication Sig Dispense Refill   acetaminophen (TYLENOL) 500 MG tablet Take 500 mg by mouth every 6 (six) hours as needed for moderate pain or fever.     albuterol (PROAIR HFA) 108 (90 Base) MCG/ACT inhaler 2 puffs up to every 4 hours as needed only  if your can't catch your breath (Patient taking differently: Inhale 2 puffs into the lungs every 4 (four) hours as needed for shortness of breath.)     [START ON 08/25/2021] amiodarone (PACERONE) 200 MG tablet Advised to take amiodarone 400 mg twice daily for 7 days followed by amiodarone 200 mg twice daily for 7 days  then followed by amiodarone 200 mg daily until follow-up with cardiology. 50 tablet 0   aspirin 81 MG tablet Take 81 mg by mouth at bedtime.     chlorpheniramine-HYDROcodone 10-8 MG/5ML Take 5 mLs by mouth at bedtime as needed for cough.     Coenzyme Q10 (CO Q 10 PO) Take 1 capsule by mouth daily.     furosemide (LASIX) 20 MG tablet Take 1 tablet po BID for 5 days then go back to taking 1 tablet po QD 90 tablet 1   levothyroxine (SYNTHROID) 25 MCG tablet TAKE 0.5 TABLETS (12.5 MCG TOTAL) BY MOUTH DAILY. IN ADDITION TO THE 50 MCG. (Patient taking differently: Take 12.5 mcg by mouth See admin instructions. 12.5 mcg daily except on sunday) 45 tablet 1   levothyroxine (SYNTHROID) 50 MCG tablet TAKE 1 TABLET BY MOUTH EVERY DAY BEFORE BREAKFAST (Patient taking differently: Take 50 mcg by mouth See admin instructions. Along with 12.5 mcg qd except on Sunday (pt just takes 50 mcg)) 90 tablet 3  lidocaine-prilocaine (EMLA) cream Apply 1 Application topically as needed. (Patient taking differently: Apply 1 Application topically as needed (prior to port access).) 30 g 2   metoprolol succinate (TOPROL-XL) 100 MG 24 hr tablet Take 1 tablet (100 mg total) by mouth daily. Take with or immediately following a meal. (Patient taking differently: Take 100 mg by mouth daily.) 90 tablet 1   Omega-3 Fatty Acids (OMEGA-3 PO) Take 1,000 mg by mouth in the morning and at bedtime.     OXYGEN Inhale 3.5 L into the lungs continuous.     potassium chloride SA (KLOR-CON M) 10 MEQ tablet Take 1 tablet (10 mEq total) by mouth daily. 30 tablet 3   rosuvastatin (CRESTOR) 40 MG tablet Take 1 tablet (40 mg total) by mouth daily. (Patient taking differently: Take 40 mg by mouth every evening.) 90 tablet 3   albuterol (PROVENTIL) (2.5 MG/3ML) 0.083% nebulizer solution Take 2.5 mg by nebulization 3 (three) times daily as needed for wheezing or shortness of breath. (Patient not taking: Reported on 08/20/2021)     fluconazole (DIFLUCAN) 100 MG  tablet Take 1 tablet (100 mg total) by mouth daily. (Patient not taking: Reported on 08/20/2021) 10 tablet 0   prochlorperazine (COMPAZINE) 10 MG tablet Take 1 tablet (10 mg total) by mouth every 6 (six) hours as needed. (Patient not taking: Reported on 08/20/2021) 30 tablet 2   No facility-administered medications prior to visit.     Review of Systems:   Constitutional: No weight loss or gain, night sweats, fevers, chills, fatigue, or lassitude. HEENT: No headaches, difficulty swallowing, tooth/dental problems, or sore throat. No sneezing, itching, ear ache, nasal congestion, or post nasal drip CV:  No chest pain, orthopnea, PND, swelling in lower extremities, anasarca, dizziness, palpitations, syncope Resp: +shortness of breath with exertion; chronic productive cough. No excess mucus or change in color of mucus. No hemoptysis. No wheezing.  No chest wall deformity Skin: No rash, lesions, ulcerations MSK:  No joint pain or swelling.  No decreased range of motion.  No back pain. Neuro: No dizziness or lightheadedness.  Psych: No depression or anxiety. Mood stable.     Physical Exam:  BP (!) 106/48 (BP Location: Right Arm, Patient Position: Sitting, Cuff Size: Normal)   Pulse 70   Temp 99.3 F (37.4 C) (Oral)   Ht 5\' 3"  (1.6 m)   Wt 208 lb 12.8 oz (94.7 kg)   SpO2 92%   BMI 36.99 kg/m   GEN: Pleasant, interactive, chronically-ill appearing; obese; in no acute distress. HEENT:  Normocephalic and atraumatic. PERRLA. Sclera white. Nasal turbinates pink, moist and patent bilaterally. No rhinorrhea present. Oropharynx pink and moist, without exudate or edema. No lesions, ulcerations, or postnasal drip.  NECK:  Supple w/ fair ROM. No JVD present. Normal carotid impulses w/o bruits. Thyroid symmetrical with no goiter or nodules palpated. No lymphadenopathy.   CV: RRR, no m/r/g, no peripheral edema. Pulses intact, +2 bilaterally. No cyanosis, pallor or clubbing. PULMONARY:  Unlabored, regular  breathing. Decreased left lung sounds with minimal end expiratory wheeze; right lung clear A&P w/o wheezes/rales/rhonchi. No accessory muscle use. No dullness to percussion. GI: BS present and normoactive. Soft, non-tender to palpation.  Neuro: A/Ox3. No focal deficits noted.   Skin: Warm, no lesions or rashe Psych: Normal affect and behavior. Judgement and thought content appropriate.     Lab Results:  CBC    Component Value Date/Time   WBC 7.2 08/20/2021 0812   WBC 10.7 (H) 08/11/2021 0316  RBC 3.56 (L) 08/20/2021 0812   HGB 9.3 (L) 08/20/2021 0812   HGB 13.3 05/02/2021 1055   HCT 32.3 (L) 08/20/2021 0812   HCT 40.8 05/02/2021 1055   PLT 278 08/20/2021 0812   PLT 220 05/02/2021 1055   MCV 90.7 08/20/2021 0812   MCV 88 05/02/2021 1055   MCH 26.1 08/20/2021 0812   MCHC 28.8 (L) 08/20/2021 0812   RDW 16.1 (H) 08/20/2021 0812   RDW 13.3 05/02/2021 1055   LYMPHSABS 0.6 (L) 08/20/2021 0812   LYMPHSABS 2.2 10/25/2020 0935   MONOABS 0.6 08/20/2021 0812   EOSABS 0.3 08/20/2021 0812   EOSABS 0.1 10/25/2020 0935   BASOSABS 0.0 08/20/2021 0812   BASOSABS 0.1 10/25/2020 0935    BMET    Component Value Date/Time   NA 142 08/20/2021 0812   NA 143 05/02/2021 1055   K 3.9 08/20/2021 0812   CL 93 (L) 08/20/2021 0812   CO2 >45 (H) 08/20/2021 0812   GLUCOSE 129 (H) 08/20/2021 0812   BUN 12 08/20/2021 0812   BUN 13 05/02/2021 1055   CREATININE 0.83 08/20/2021 0812   CREATININE 0.81 07/02/2016 1323   CALCIUM 8.7 (L) 08/20/2021 0812   GFRNONAA >60 08/20/2021 0812   GFRAA 98 04/04/2020 1418    BNP    Component Value Date/Time   BNP 360.1 (H) 08/08/2021 2154     Imaging:  DG Chest 2 View  Result Date: 08/22/2021 CLINICAL DATA:  Follow up left lung collapse related to left hilar mass. EXAM: CHEST - 2 VIEW COMPARISON:  08/07/2021 FINDINGS: Small amount of aerated left upper lobe noted, increased from prior, only 5-10% of left hemithoracic volume. Power injectable right  Port-A-Cath tip: SVC. Prior CABG. Atherosclerotic calcification of the aortic arch. Borderline enlargement of the cardiopericardial silhouette. The right lung appears clear. No significant leftward shift of the mediastinum. Old left lateral rib deformity. IMPRESSION: 1. Nearly completely opacified left hemithorax. Small amount of increased aeration in the left upper lobe but only amounting to about 5-10% of left hemithoracic volume. The remainder of the left hemithorax appears opacified. No shift of cardiac or mediastinal structures. 2.  Aortic Atherosclerosis (ICD10-I70.0).  Prior CABG. Electronically Signed   By: Van Clines M.D.   On: 08/22/2021 10:43   IR IMAGING GUIDED PORT INSERTION  Result Date: 08/16/2021 INDICATION: 76 year old female referred for port catheter We will be unable to proceed with sedation given the patient's known left-sided lung collapse, oxygen requirement, and CO2 retention. EXAM: IMAGE GUIDED PORT CATHETER MEDICATIONS: None ANESTHESIA/SEDATION: Moderate (conscious) sedation was not employed during this procedure. A total of Versed 0.5 mg and Fentanyl 0 mcg was administered intravenously. Moderate Sedation Time: 0 minutes. The patient's level of consciousness and vital signs were monitored continuously by radiology nursing throughout the procedure under my direct supervision. FLUOROSCOPY TIME:  Fluoroscopy Time: 0 minutes 30 seconds (6 mGy). COMPLICATIONS: None PROCEDURE: The procedure, risks, benefits, and alternatives were explained to the patient. Questions regarding the procedure were encouraged and answered. The patient understands and consents to the procedure. Ultrasound survey was performed with images stored and sent to PACs. Right IJ vein documented to be patent. The right neck and chest was prepped with chlorhexidine, and draped in the usual sterile fashion using maximum barrier technique (cap and mask, sterile gown, sterile gloves, large sterile sheet, hand hygiene  and cutaneous antiseptic). Local anesthesia was attained by infiltration with 1% lidocaine without epinephrine. Ultrasound demonstrated patency of the right internal jugular vein, and this was  documented with an image. Under real-time ultrasound guidance, this vein was accessed with a 21 gauge micropuncture needle and image documentation was performed. A small dermatotomy was made at the access site with an 11 scalpel. A 0.018" wire was advanced into the SVC and used to estimate the length of the internal catheter. The access needle exchanged for a 47F micropuncture vascular sheath. The 0.018" wire was then removed and a 0.035" wire advanced into the IVC. An appropriate location for the subcutaneous reservoir was selected below the clavicle and an incision was made through the skin and underlying soft tissues. The subcutaneous tissues were then dissected using a combination of blunt and sharp surgical technique and a pocket was formed. A single lumen power injectable portacatheter was then tunneled through the subcutaneous tissues from the pocket to the dermatotomy and the port reservoir placed within the subcutaneous pocket. The venous access site was then serially dilated and a peel away vascular sheath placed over the wire. The wire was removed and the port catheter advanced into position under fluoroscopic guidance. Some redundancy in the catheter was reduced by removing the catheter at the hub of the port apparatus and straightening the catheter with the use of an 018 wire. The catheter was then reattached at the hub and check for leaks. The catheter tip is positioned in the cavoatrial junction. This was documented with a spot image. The portacatheter was then tested and found to flush and aspirate well. The port was flushed with saline followed by 100 units/mL heparinized saline. The pocket was then closed in two layers using first subdermal inverted interrupted absorbable sutures followed by a running  subcuticular suture. The epidermis was then sealed with Dermabond. The dermatotomy at the venous access site was also seal with Dermabond. Patient tolerated the procedure well and remained hemodynamically stable throughout. No complications encountered and no significant blood loss encountered IMPRESSION: Status post right IJ port catheter placement. Signed, Dulcy Fanny. Nadene Rubins, RPVI [facility] Radiology Electronically Signed   By: Corrie Mckusick D.O.   On: 08/16/2021 15:14   MR BRAIN W WO CONTRAST  Result Date: 08/10/2021 CLINICAL DATA:  Evaluate for metastatic disease EXAM: MRI HEAD WITHOUT AND WITH CONTRAST TECHNIQUE: Multiplanar, multiecho pulse sequences of the brain and surrounding structures were obtained without and with intravenous contrast. CONTRAST:  9.90mL GADAVIST GADOBUTROL 1 MMOL/ML IV SOLN COMPARISON:  CT head dated 1 day prior FINDINGS: Brain: There is no acute intracranial hemorrhage, extra-axial fluid collection, or acute infarct. Parenchymal volume is normal for age. The ventricles are normal in size. Patchy FLAIR signal abnormality throughout the subcortical and periventricular white matter likely reflects sequela of chronic white matter microangiopathy. Curvilinear SWI signal dropout in both parieto-occipital regions is consistent with sequela of prior hemorrhage. Additional punctate chronic microhemorrhages are noted both frontal lobes predominantly peripherally. There is no abnormal enhancement. The finding on the prior head CT was likely artifactual, as there is no abnormal enhancement in the pons on the current study. There is no suspicious parenchymal signal abnormality. There is no mass effect or midline shift. Vascular: Normal flow voids. Skull and upper cervical spine: Normal marrow signal. Sinuses/Orbits: The paranasal sinuses are clear. The globes and orbits are unremarkable. Other: None. IMPRESSION: 1. No evidence of  intracranial metastatic disease. The finding on the prior head CT was likely artifactual. 2. Scattered foci of chronic microhemorrhage predominantly peripherally distributed raise suspicion for cerebral amyloid angiopathy. Electronically Signed   By: Collier Salina  Noone M.D.   On: 08/10/2021 16:20   CT Head W or Wo Contrast  Result Date: 08/09/2021 CLINICAL DATA:  Non-small cell lung cancer, evaluate for brain Mets prior to anticoagulation EXAM: CT HEAD WITHOUT AND WITH CONTRAST TECHNIQUE: Contiguous axial images were obtained from the base of the skull through the vertex without and with intravenous contrast. RADIATION DOSE REDUCTION: This exam was performed according to the departmental dose-optimization program which includes automated exposure control, adjustment of the mA and/or kV according to patient size and/or use of iterative reconstruction technique. CONTRAST:  74mL OMNIPAQUE IOHEXOL 300 MG/ML  SOLN COMPARISON:  None Available. FINDINGS: Brain: 3 mm focus of enhancement in the pons (series 11, image 27), which is nonspecific and could represent an enhancing lesion versus a vascular lesion such as a capillary telangiectasia. No additional foci of enhancement are seen. No evidence of acute infarction, hemorrhage, cerebral edema, mass effect, or midline shift. No hydrocephalus or extra-axial fluid collection. Vascular: No hyperdense vessel. Normal enhancement of the circle-of-Willis. Skull: Hyperostosis frontalis. Negative for fracture or focal lesion. Sinuses/Orbits: No acute finding. Other: The mastoid air cells are well aerated. IMPRESSION: 3 mm focus of enhancement in the pons, which is nonspecific and could represent an enhancing lesion versus a vascular lesion such as a capillary telangiectasia. An MRI of the brain with and without contrast may help distinguish the etiology, and is far more sensitive for small metastatic lesions. Electronically Signed   By: Merilyn Baba M.D.   On: 08/09/2021 02:05   CT  CHEST W CONTRAST  Result Date: 08/07/2021 CLINICAL DATA:  Pleural effusion, malignancy suspected EXAM: CT CHEST WITH CONTRAST TECHNIQUE: Multidetector CT imaging of the chest was performed during intravenous contrast administration. RADIATION DOSE REDUCTION: This exam was performed according to the departmental dose-optimization program which includes automated exposure control, adjustment of the mA and/or kV according to patient size and/or use of iterative reconstruction technique. CONTRAST:  65mL OMNIPAQUE IOHEXOL 300 MG/ML  SOLN COMPARISON:  Pet CT 06/08/2021, chest x-ray 07/25/2021, chest x-ray 08/07/2021 FINDINGS: Cardiovascular: Normal heart size. No significant pericardial effusion. The thoracic aorta is normal in caliber. No atherosclerotic plaque of the thoracic aorta. Four-vessel coronary artery calcifications status post coronary artery bypass graft. The main pulmonary artery is normal in caliber. No central or segmental pulmonary embolus. Mediastinum/Nodes: Known left hilar mass lesion poorly visualized measuring up to at least 4 cm. No right hilar lymphadenopathy. No enlarged mediastinal or axillary lymph nodes. Thyroid gland, trachea, and esophagus demonstrate no significant findings. Lungs/Pleura: Interval complete hypodense heterogeneous opacification of the left lung consistent. Interval development of a at least small volume left pleural effusion. The right lung is clear. No right pleural effusion. No pneumothorax. Upper Abdomen: Subcentimeter splenule (2:124). No acute abnormality. Musculoskeletal: No chest wall abnormality. No suspicious lytic or blastic osseous lesions. No acute displaced fracture. Multilevel degenerative changes of the spine. Intact sternotomy wires. IMPRESSION: Interval development of postobstructive infection/collapse of the entire left lung in the setting of known left hilar mass lesion and interval development of at least small volume left pleural effusion.  Electronically Signed   By: Iven Finn M.D.   On: 08/07/2021 17:38   DG Chest Port 1 View  Result Date: 08/07/2021 CLINICAL DATA:  Shortness of breath EXAM: PORTABLE CHEST 1 VIEW COMPARISON:  Chest radiograph dated July 26, 2021 FINDINGS: Evaluation of cardiomediastinal silhouette is limited due to complete opacification of the left hemithorax. Right lung is clear. Sternotomy wires representing prior coronary artery bypass grafting  unchanged. IMPRESSION: Complete opacification of the left hemithorax, which may represent atelectasis or effusion. Right lung is clear. Electronically Signed   By: Keane Police D.O.   On: 08/07/2021 14:22   DG Chest 2 View  Result Date: 07/26/2021 CLINICAL DATA:  Pre-op for video bronchoscopy. EXAM: CHEST - 2 VIEW COMPARISON:  Chest radiograph 07/03/2020.  Head CT 06/08/2021 FINDINGS: Markedly increased densities in the mid and lower chest are suggestive for volume loss and probable left pleural fluid. Streaky densities in left upper lung could represent postobstructive changes. Again noted are past CABG changes. Right lung remains clear. Negative for pneumothorax. Left cardiac border is obscured by the left chest densities. IMPRESSION: Increased densities in the left chest are most compatible with volume loss and probably pleural fluid. Streaky densities in the left upper lung could also represent postobstructive changes. Electronically Signed   By: Markus Daft M.D.   On: 07/26/2021 21:16    CARBOplatin (PARAPLATIN) 200 mg in sodium chloride 0.9 % 100 mL chemo infusion     Date Action Dose Route User   08/13/2021 1705 Infusion Verify (none) Intravenous Anastasia Pall, RN   08/13/2021 1705 Rate/Dose Change (none) Intravenous Anastasia Pall, RN   08/13/2021 1705 New Bag/Given 200 mg Intravenous Anastasia Pall, RN      CARBOplatin (PARAPLATIN) 200 mg in sodium chloride 0.9 % 250 mL chemo infusion     Date Action Dose Route User   08/20/2021 1233 Rate/Dose Change (none)  Intravenous Fara Boros, RN   08/20/2021 1233 Rate/Dose Change (none) Intravenous Fara Boros, RN   08/20/2021 1203 Rate/Dose Change (none) Intravenous Fara Boros, RN   08/20/2021 1203 New Bag/Given 200 mg Intravenous Fara Boros, RN      dexamethasone (DECADRON) 10 mg in sodium chloride 0.9 % 50 mL IVPB     Date Action Dose Route User   08/13/2021 1202 Infusion Verify (none) Intravenous Clyda Hurdle, RN   08/13/2021 1147 Rate/Dose Change (none) Intravenous Clyda Hurdle, RN   08/13/2021 1147 New Bag/Given 10 mg Intravenous Georgette Shell T, RN      dexamethasone (DECADRON) 10 mg in sodium chloride 0.9 % 50 mL IVPB     Date Action Dose Route User   08/20/2021 0950 Rate/Dose Change (none) Intravenous Fara Boros, RN   08/20/2021 0950 New Bag/Given 10 mg Intravenous Belva Chimes, RN      diphenhydrAMINE (BENADRYL) injection 50 mg     Date Action Dose Route User   08/13/2021 1225 Given 50 mg Intravenous Georgette Shell T, RN      diphenhydrAMINE (BENADRYL) injection 50 mg     Date Action Dose Route User   08/20/2021 0921 Given 50 mg Intravenous Fara Boros, RN      famotidine (PEPCID) IVPB 20 mg premix     Date Action Dose Route User   08/13/2021 1219 Infusion Verify (none) Intravenous Clyda Hurdle, RN   08/13/2021 1204 Rate/Dose Change (none) Intravenous Clyda Hurdle, RN   08/13/2021 1204 New Bag/Given 20 mg Intravenous Georgette Shell T, RN      famotidine (PEPCID) IVPB 20 mg premix     Date Action Dose Route User   08/20/2021 0947 Infusion Verify (none) Intravenous Fara Boros, RN   08/20/2021 0932 Rate/Dose Change (none) Intravenous Fara Boros, RN   08/20/2021 0932 New Bag/Given 20 mg Intravenous Fara Boros, RN      heparin lock flush 100 unit/mL  Date Action Dose Route User   08/20/2021 1238 Given 500 Units Intracatheter Fara Boros, RN      PACLitaxel (TAXOL) 90 mg in sodium chloride 0.9 % 250 mL chemo infusion (</= 80mg /m2)      Date Action Dose Route User   08/13/2021 1609 Rate/Dose Change (none) Intravenous Person, Blaine Hamper, RN   08/13/2021 1551 Rate/Dose Change (none) Intravenous Clyda Hurdle, RN   08/13/2021 1533 Rate/Dose Change (none) Intravenous Clyda Hurdle, RN   08/13/2021 1514 Restarted (none) Intravenous Clyda Hurdle, [place]   08/13/2021 1337 Rate/Dose Change (none) Intravenous Georgette Shell T, RN      PACLitaxel (TAXOL) 90 mg in sodium chloride 0.9 % 250 mL chemo infusion (</= 80mg /m2)     Date Action Dose Route User   08/20/2021 1156 Rate/Dose Change (none) Intravenous Fara Boros, RN   08/20/2021 1151 Rate/Dose Change (none) Intravenous Fara Boros, RN   08/20/2021 1151 Rate/Dose Change (none) Intravenous Fara Boros, RN   08/20/2021 1051 Rate/Dose Change (none) Intravenous Fara Boros, RN   08/20/2021 1051 New Bag/Given 90 mg Intravenous Fara Boros, RN      palonosetron (ALOXI) injection 0.25 mg     Date Action Dose Route User   08/13/2021 1220 Given 0.25 mg Intravenous Georgette Shell T, RN      palonosetron (ALOXI) injection 0.25 mg     Date Action Dose Route User   08/20/2021 [phone] Given 0.25 mg Intravenous Danton Sewer B, RN      0.9 %  sodium chloride infusion     Date Action Dose Route User   08/13/2021 1514 Restarted (none) Intravenous Clyda Hurdle, RN   08/13/2021 1324 Restarted (none) Intravenous Clyda Hurdle, RN   08/13/2021 1317 Infusion Verify (none) Intravenous Clyda Hurdle, RN   08/13/2021 1317 Infusion Verify (none) Intravenous Clyda Hurdle, RN   08/13/2021 1302 Infusion Verify (none) Intravenous Georgette Shell T, RN      0.9 %  sodium chloride infusion     Date Action Dose Route User   08/20/2021 1239 Rate/Dose Change (none) Intravenous Fara Boros, RN   08/20/2021 1236 Rate/Dose Change (none) Intravenous Fara Boros, RN   08/20/2021 1203 Infusion Verify (none) Intravenous Fara Boros, RN   08/20/2021 1201 Rate/Dose Change (none) Intravenous Fara Boros, RN   08/20/2021 1156 Rate/Dose Change (none) Intravenous Fara Boros, RN      sodium chloride flush (NS) 0.9 % injection 10 mL     Date Action Dose Route User   08/20/2021 0823 Given 10 mL Intracatheter Lynnae Sandhoff, LPN      sodium chloride flush (NS) 0.9 % injection 10 mL     Date Action Dose Route User   08/20/2021 1238 Given 10 mL Intracatheter Fara Boros, RN          Latest Ref Rng & Units 09/11/2020   12:46 PM  PFT Results  FVC-Pre L 1.38   FVC-Predicted Pre % 47   FVC-Post L 1.44   FVC-Predicted Post % 50   Pre FEV1/FVC % % 83   Post FEV1/FCV % % 78   FEV1-Pre L 1.14   FEV1-Predicted Pre % 52   FEV1-Post L 1.12   DLCO uncorrected ml/min/mmHg 9.54   DLCO UNC% % 49   DLCO corrected ml/min/mmHg 9.38   DLCO COR %Predicted % 48   DLVA Predicted % 83   TLC L 3.02   TLC %  Predicted % 59   RV % Predicted % 61     No results found for: "NITRICOXIDE"      Assessment & Plan:   Collapse of left lung Complete left lung atelectasis due to obstructing hilar mass. Clinical and mild radiographic improvement today with 5-10% increased aeration in left lung. She is anticipated to follow up with Dr. Roxan Hockey 7/12 - may be a candidate for VATS in future post chemoradiation (anticipated to complete first round in August) if proximal obstruction is relieved.   Patient Instructions  Start Stiolto 2 puffs daily  Continue Albuterol inhaler 2 puffs or 3 mL neb every 6 hours as needed for shortness of breath or wheezing. Notify if symptoms persist despite rescue inhaler/neb use. Continue supplemental oxygen 3 lpm for goal oxygen saturation >88-90%   Follow up with Dr. Roxan Hockey on 7/12 as previously scheduled.   Follow up in 6 weeks with Dr. Melvyn Novas or Alanson Aly. If symptoms do not improve or worsen, please contact office for sooner follow up or seek emergency care.     COPD GOLD III She has DOE at baseline. She tried Incruse in the past, which  was helpful, but is not currently on any maintenance inhalers. We will start her on LAMA/LABA and continue PRN albuterol for management of her COPD/emphysema. I did educate her that this may be of little benefit right now given the proximal obstructing mass; however, hopefully if this is resolved/improved, she will notice a difference in her baseline dyspnea.   Chronic respiratory failure with hypoxia and hypercapnia (HCC) Unable to maintain saturations with exertion on pulsed O2. She was able to maintain in the 90's with 4 lpm continuous with activity. We will send order for best fit portable for her to have. She will continue 3 lpm at rest and at night. Goal >88-90%. Discussed that we could re-evaluate use of POC at her follow up appointment.   She was noted to have elevated CO2 levels on recent CMET. We will check outpatient ABG to determine need for nocturnal BiPAP/NIV for CO2 blow off.   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 She is tolerating it well so far. Anticipated to complete course in August. Follow up with Dr. Julien Nordmann as scheduled.    I spent 42 minutes of dedicated to the care of this patient on the date of this encounter to include pre-visit review of records, face-to-face time with the patient discussing conditions above, post visit ordering of testing, clinical documentation with the electronic health record, making appropriate referrals as documented, and communicating necessary findings to members of the patients care team.  Clayton Bibles, NP 08/22/2021  Pt aware and understands NP's role.

## 2021-08-22 NOTE — Assessment & Plan Note (Addendum)
Unable to maintain saturations with exertion on pulsed O2. She was able to maintain in the 90's with 4 lpm continuous with activity. We will send order for best fit portable for her to have. She will continue 3 lpm at rest and at night. Goal >88-90%. Discussed that we could re-evaluate use of POC at her follow up appointment.   She was noted to have elevated CO2 levels on recent CMET. We will check outpatient ABG to determine need for nocturnal BiPAP/NIV for CO2 blow off.

## 2021-08-23 ENCOUNTER — Other Ambulatory Visit: Payer: Self-pay

## 2021-08-23 ENCOUNTER — Ambulatory Visit: Payer: Medicare HMO

## 2021-08-23 ENCOUNTER — Ambulatory Visit
Admission: RE | Admit: 2021-08-23 | Discharge: 2021-08-23 | Disposition: A | Discharge status: Home / Self Care | Payer: Medicare HMO | Source: Ambulatory Visit | Attending: Radiation Oncology | Admitting: Radiation Oncology

## 2021-08-23 DIAGNOSIS — Z87891 Personal history of nicotine dependence: Secondary | ICD-10-CM | POA: Diagnosis not present

## 2021-08-23 DIAGNOSIS — C3492 Malignant neoplasm of unspecified part of left bronchus or lung: Secondary | ICD-10-CM | POA: Diagnosis not present

## 2021-08-23 DIAGNOSIS — Z510 Encounter for antineoplastic radiation therapy: Secondary | ICD-10-CM | POA: Diagnosis not present

## 2021-08-23 DIAGNOSIS — C3412 Malignant neoplasm of upper lobe, left bronchus or lung: Secondary | ICD-10-CM | POA: Diagnosis not present

## 2021-08-23 LAB — RAD ONC ARIA SESSION SUMMARY
Course Elapsed Days: 21
Plan Fractions Treated to Date: 12
Plan Prescribed Dose Per Fraction: 2 Gy
Plan Total Fractions Prescribed: 30
Plan Total Prescribed Dose: 60 Gy
Reference Point Dosage Given to Date: 24 Gy
Reference Point Session Dosage Given: 2 Gy
Session Number: 12

## 2021-08-24 ENCOUNTER — Ambulatory Visit
Admission: RE | Admit: 2021-08-24 | Discharge: 2021-08-24 | Disposition: A | Discharge status: Home / Self Care | Payer: Medicare HMO | Source: Ambulatory Visit | Attending: Radiation Oncology | Admitting: Radiation Oncology

## 2021-08-24 ENCOUNTER — Other Ambulatory Visit: Payer: Self-pay

## 2021-08-24 DIAGNOSIS — C3412 Malignant neoplasm of upper lobe, left bronchus or lung: Secondary | ICD-10-CM | POA: Diagnosis not present

## 2021-08-24 DIAGNOSIS — Z510 Encounter for antineoplastic radiation therapy: Secondary | ICD-10-CM | POA: Diagnosis not present

## 2021-08-24 DIAGNOSIS — Z87891 Personal history of nicotine dependence: Secondary | ICD-10-CM | POA: Diagnosis not present

## 2021-08-24 DIAGNOSIS — C3492 Malignant neoplasm of unspecified part of left bronchus or lung: Secondary | ICD-10-CM | POA: Diagnosis not present

## 2021-08-24 LAB — RAD ONC ARIA SESSION SUMMARY
Course Elapsed Days: 22
Plan Fractions Treated to Date: 13
Plan Prescribed Dose Per Fraction: 2 Gy
Plan Total Fractions Prescribed: 30
Plan Total Prescribed Dose: 60 Gy
Reference Point Dosage Given to Date: 26 Gy
Reference Point Session Dosage Given: 2 Gy
Session Number: 13

## 2021-08-24 MED FILL — Dexamethasone Sodium Phosphate Inj 100 MG/10ML: INTRAMUSCULAR | Qty: 1 | Status: AC

## 2021-08-24 NOTE — Telephone Encounter (Signed)
Printed and put on your desk

## 2021-08-25 DIAGNOSIS — J9601 Acute respiratory failure with hypoxia: Secondary | ICD-10-CM | POA: Diagnosis not present

## 2021-08-27 ENCOUNTER — Ambulatory Visit
Admission: RE | Admit: 2021-08-27 | Discharge: 2021-08-27 | Disposition: A | Discharge status: Home / Self Care | Payer: Medicare HMO | Source: Ambulatory Visit | Attending: Radiation Oncology | Admitting: Radiation Oncology

## 2021-08-27 ENCOUNTER — Other Ambulatory Visit: Payer: Self-pay

## 2021-08-27 ENCOUNTER — Other Ambulatory Visit: Payer: Medicare HMO

## 2021-08-27 ENCOUNTER — Other Ambulatory Visit: Payer: Self-pay | Admitting: Physician Assistant

## 2021-08-27 ENCOUNTER — Inpatient Hospital Stay: Payer: Medicare HMO

## 2021-08-27 ENCOUNTER — Telehealth: Payer: Self-pay | Admitting: Nurse Practitioner

## 2021-08-27 VITALS — BP 111/54 | HR 69 | Temp 98.5°F | Resp 18

## 2021-08-27 DIAGNOSIS — C3492 Malignant neoplasm of unspecified part of left bronchus or lung: Secondary | ICD-10-CM | POA: Diagnosis not present

## 2021-08-27 DIAGNOSIS — C3412 Malignant neoplasm of upper lobe, left bronchus or lung: Secondary | ICD-10-CM | POA: Diagnosis not present

## 2021-08-27 DIAGNOSIS — Z5111 Encounter for antineoplastic chemotherapy: Secondary | ICD-10-CM | POA: Diagnosis not present

## 2021-08-27 DIAGNOSIS — D649 Anemia, unspecified: Secondary | ICD-10-CM

## 2021-08-27 DIAGNOSIS — Z87891 Personal history of nicotine dependence: Secondary | ICD-10-CM | POA: Diagnosis not present

## 2021-08-27 DIAGNOSIS — Z510 Encounter for antineoplastic radiation therapy: Secondary | ICD-10-CM | POA: Diagnosis not present

## 2021-08-27 DIAGNOSIS — Z95828 Presence of other vascular implants and grafts: Secondary | ICD-10-CM

## 2021-08-27 LAB — CBC WITH DIFFERENTIAL (CANCER CENTER ONLY)
Abs Immature Granulocytes: 0.05 10*3/uL (ref 0.00–0.07)
Basophils Absolute: 0 10*3/uL (ref 0.0–0.1)
Basophils Relative: 1 %
Eosinophils Absolute: 0.2 10*3/uL (ref 0.0–0.5)
Eosinophils Relative: 4 %
HCT: 30.4 % — ABNORMAL LOW (ref 36.0–46.0)
Hemoglobin: 8.9 g/dL — ABNORMAL LOW (ref 12.0–15.0)
Immature Granulocytes: 1 %
Lymphocytes Relative: 12 %
Lymphs Abs: 0.6 10*3/uL — ABNORMAL LOW (ref 0.7–4.0)
MCH: 26.3 pg (ref 26.0–34.0)
MCHC: 29.3 g/dL — ABNORMAL LOW (ref 30.0–36.0)
MCV: 89.9 fL (ref 80.0–100.0)
Monocytes Absolute: 0.4 10*3/uL (ref 0.1–1.0)
Monocytes Relative: 9 %
Neutro Abs: 3.6 10*3/uL (ref 1.7–7.7)
Neutrophils Relative %: 73 %
Platelet Count: 247 10*3/uL (ref 150–400)
RBC: 3.38 MIL/uL — ABNORMAL LOW (ref 3.87–5.11)
RDW: 16.5 % — ABNORMAL HIGH (ref 11.5–15.5)
WBC Count: 4.8 10*3/uL (ref 4.0–10.5)
nRBC: 0 % (ref 0.0–0.2)

## 2021-08-27 LAB — RAD ONC ARIA SESSION SUMMARY
Course Elapsed Days: 25
Plan Fractions Treated to Date: 14
Plan Prescribed Dose Per Fraction: 2 Gy
Plan Total Fractions Prescribed: 30
Plan Total Prescribed Dose: 60 Gy
Reference Point Dosage Given to Date: 28 Gy
Reference Point Session Dosage Given: 2 Gy
Session Number: 14

## 2021-08-27 LAB — CMP (CANCER CENTER ONLY)
ALT: 67 U/L — ABNORMAL HIGH (ref 0–44)
AST: 41 U/L (ref 15–41)
Albumin: 3 g/dL — ABNORMAL LOW (ref 3.5–5.0)
Alkaline Phosphatase: 70 U/L (ref 38–126)
Anion gap: 2 — ABNORMAL LOW (ref 5–15)
BUN: 10 mg/dL (ref 8–23)
CO2: 43 mmol/L — ABNORMAL HIGH (ref 22–32)
Calcium: 8.9 mg/dL (ref 8.9–10.3)
Chloride: 97 mmol/L — ABNORMAL LOW (ref 98–111)
Creatinine: 0.81 mg/dL (ref 0.44–1.00)
GFR, Estimated: >60 mL/min (ref >60)
Glucose, Bld: 120 mg/dL — ABNORMAL HIGH (ref 70–99)
Potassium: 4.1 mmol/L (ref 3.5–5.1)
Sodium: 142 mmol/L (ref 135–145)
Total Bilirubin: 0.3 mg/dL (ref 0.3–1.2)
Total Protein: 6 g/dL — ABNORMAL LOW (ref 6.5–8.1)

## 2021-08-27 MED ORDER — DIPHENHYDRAMINE HCL 50 MG/ML IJ SOLN
50.0000 mg | Freq: Once | INTRAMUSCULAR | Status: AC
  Administered 2021-08-27: 50 mg via INTRAVENOUS
  Filled 2021-08-27: qty 1

## 2021-08-27 MED ORDER — SODIUM CHLORIDE 0.9% FLUSH
10.0000 mL | Freq: Once | INTRAVENOUS | Status: AC
  Administered 2021-08-27: 10 mL

## 2021-08-27 MED ORDER — SODIUM CHLORIDE 0.9 % IV SOLN
10.0000 mg | Freq: Once | INTRAVENOUS | Status: AC
  Administered 2021-08-27: 10 mg via INTRAVENOUS
  Filled 2021-08-27: qty 10

## 2021-08-27 MED ORDER — HEPARIN SOD (PORK) LOCK FLUSH 100 UNIT/ML IV SOLN
500.0000 [IU] | Freq: Once | INTRAVENOUS | Status: AC | PRN
  Administered 2021-08-27: 500 [IU]

## 2021-08-27 MED ORDER — PALONOSETRON HCL INJECTION 0.25 MG/5ML
0.2500 mg | Freq: Once | INTRAVENOUS | Status: AC
  Administered 2021-08-27: 0.25 mg via INTRAVENOUS
  Filled 2021-08-27: qty 5

## 2021-08-27 MED ORDER — FAMOTIDINE IN NACL 20-0.9 MG/50ML-% IV SOLN
20.0000 mg | Freq: Once | INTRAVENOUS | Status: AC
  Administered 2021-08-27: 20 mg via INTRAVENOUS
  Filled 2021-08-27: qty 50

## 2021-08-27 MED ORDER — SODIUM CHLORIDE 0.9% FLUSH
10.0000 mL | INTRAVENOUS | Status: DC | PRN
  Administered 2021-08-27: 10 mL

## 2021-08-27 MED ORDER — SODIUM CHLORIDE 0.9 % IV SOLN
45.0000 mg/m2 | Freq: Once | INTRAVENOUS | Status: AC
  Administered 2021-08-27: 90 mg via INTRAVENOUS
  Filled 2021-08-27: qty 15

## 2021-08-27 MED ORDER — SODIUM CHLORIDE 0.9 % IV SOLN
197.4000 mg | Freq: Once | INTRAVENOUS | Status: AC
  Administered 2021-08-27: 200 mg via INTRAVENOUS
  Filled 2021-08-27: qty 20

## 2021-08-27 MED ORDER — SODIUM CHLORIDE 0.9 % IV SOLN: Freq: Once | INTRAVENOUS | Status: AC

## 2021-08-27 NOTE — Telephone Encounter (Signed)
Please let the patient know that for now, I would like her to continue with the potassium supplement. She can cut it back to every other day while off fluid pill.  Thanks so much.   -HB

## 2021-08-27 NOTE — Telephone Encounter (Signed)
She has just had a cmp drawn for her infusion. Once I get that result back, I can answer that better. Send this back to me, so I look later on. Thanks.

## 2021-08-27 NOTE — Telephone Encounter (Signed)
Patient has completed the fluid pill and wants to know if she is still supposed to be taking the potassium? Please advise.

## 2021-08-27 NOTE — Patient Instructions (Addendum)
[facility]COL[facility]   Discharge Instructions: Thank you for choosing [facility] to provide your oncology and hematology care.   If you have a lab appointment with the [facility], please go directly to the [facility] and check in at the registration area.   Wear comfortable clothing and clothing appropriate for easy access to any Portacath or PICC line.   We strive to give you quality time with your provider. You may need to reschedule your appointment if you arrive late (15 or more minutes).  Arriving late affects you and other patients whose appointments are after yours.  Also, if you miss three or more appointments without notifying the office, you may be dismissed from the clinic at the provider's discretion.      For prescription refill requests, have your pharmacy contact our office and allow 72 hours for refills to be completed.    Today you received the following chemotherapy and/or immunotherapy agents: paclitaxel and carboplatin      To help prevent nausea and vomiting after your treatment, we encourage you to take your nausea medication as directed.  BELOW ARE SYMPTOMS THAT SHOULD BE REPORTED IMMEDIATELY: *FEVER GREATER THAN 100.4 F (38 C) OR HIGHER *CHILLS OR SWEATING *NAUSEA AND VOMITING THAT IS NOT CONTROLLED WITH YOUR NAUSEA MEDICATION *UNUSUAL SHORTNESS OF BREATH *UNUSUAL BRUISING OR BLEEDING *URINARY PROBLEMS (pain or burning when urinating, or frequent urination) *BOWEL PROBLEMS (unusual diarrhea, constipation, pain near the anus) TENDERNESS IN MOUTH AND THROAT WITH OR WITHOUT PRESENCE OF ULCERS (sore throat, sores in mouth, or a toothache) UNUSUAL RASH, SWELLING OR PAIN  UNUSUAL VAGINAL DISCHARGE OR ITCHING   Items with * indicate a potential emergency and should be followed up as soon as possible or go to the Emergency Department if any problems should occur.  Please show the CHEMOTHERAPY ALERT CARD or IMMUNOTHERAPY ALERT  CARD at check-in to the Emergency Department and triage nurse.  Should you have questions after your visit or need to cancel or reschedule your appointment, please contact [facility]  Dept: [phone]  and follow the prompts.  Office hours are 8:00 a.m. to 4:30 p.m. Monday - Friday. Please note that voicemails left after 4:00 p.m. may not be returned until the following business day.  We are closed weekends and major holidays. You have access to a nurse at all times for urgent questions. Please call the main number to the clinic Dept: [phone] and follow the prompts.   For any non-urgent questions, you may also contact your provider using MyChart. We now offer e-Visits for anyone 66 and older to request care online for non-urgent symptoms. For details visit [email or web address].   Also download the MyChart app! Go to the app store, search "MyChart", open the app, select , and log in with your MyChart username and password.  Masks are optional in the cancer centers. If you would like for your care team to wear a mask while they are taking care of you, please let them know. For doctor visits, patients may have with them one support person who is at least 75 years old. At this time, visitors are not allowed in the infusion area.

## 2021-08-27 NOTE — Telephone Encounter (Signed)
lmtc

## 2021-08-28 ENCOUNTER — Ambulatory Visit
Admission: RE | Admit: 2021-08-28 | Discharge: 2021-08-28 | Disposition: A | Discharge status: Home / Self Care | Payer: Medicare HMO | Source: Ambulatory Visit | Attending: Radiation Oncology | Admitting: Radiation Oncology

## 2021-08-28 ENCOUNTER — Other Ambulatory Visit: Payer: Self-pay

## 2021-08-28 ENCOUNTER — Other Ambulatory Visit: Payer: Self-pay | Admitting: Thoracic Surgery (Cardiothoracic Vascular Surgery)

## 2021-08-28 DIAGNOSIS — C3492 Malignant neoplasm of unspecified part of left bronchus or lung: Secondary | ICD-10-CM

## 2021-08-28 DIAGNOSIS — Z87891 Personal history of nicotine dependence: Secondary | ICD-10-CM | POA: Diagnosis not present

## 2021-08-28 DIAGNOSIS — Z510 Encounter for antineoplastic radiation therapy: Secondary | ICD-10-CM | POA: Diagnosis not present

## 2021-08-28 DIAGNOSIS — C3412 Malignant neoplasm of upper lobe, left bronchus or lung: Secondary | ICD-10-CM | POA: Diagnosis not present

## 2021-08-28 LAB — RAD ONC ARIA SESSION SUMMARY
Course Elapsed Days: 26
Plan Fractions Treated to Date: 15
Plan Prescribed Dose Per Fraction: 2 Gy
Plan Total Fractions Prescribed: 30
Plan Total Prescribed Dose: 60 Gy
Reference Point Dosage Given to Date: 30 Gy
Reference Point Session Dosage Given: 2 Gy
Session Number: 15

## 2021-08-29 ENCOUNTER — Ambulatory Visit: Payer: Medicare HMO | Admitting: Thoracic Surgery (Cardiothoracic Vascular Surgery)

## 2021-08-29 ENCOUNTER — Ambulatory Visit
Admission: RE | Admit: 2021-08-29 | Discharge: 2021-08-29 | Disposition: A | Discharge status: Home / Self Care | Payer: Medicare HMO | Source: Ambulatory Visit | Attending: Radiation Oncology | Admitting: Radiation Oncology

## 2021-08-29 ENCOUNTER — Other Ambulatory Visit: Payer: Self-pay

## 2021-08-29 ENCOUNTER — Ambulatory Visit
Admission: RE | Admit: 2021-08-29 | Discharge: 2021-08-29 | Disposition: A | Discharge status: Home / Self Care | Payer: Medicare HMO | Source: Ambulatory Visit | Attending: Thoracic Surgery (Cardiothoracic Vascular Surgery) | Admitting: Thoracic Surgery (Cardiothoracic Vascular Surgery)

## 2021-08-29 VITALS — BP 98/52 | HR 73 | Resp 20 | Ht 63.0 in | Wt 208.0 lb

## 2021-08-29 DIAGNOSIS — Z85118 Personal history of other malignant neoplasm of bronchus and lung: Secondary | ICD-10-CM | POA: Diagnosis not present

## 2021-08-29 DIAGNOSIS — Z87891 Personal history of nicotine dependence: Secondary | ICD-10-CM | POA: Diagnosis not present

## 2021-08-29 DIAGNOSIS — J90 Pleural effusion, not elsewhere classified: Secondary | ICD-10-CM | POA: Diagnosis not present

## 2021-08-29 DIAGNOSIS — C3412 Malignant neoplasm of upper lobe, left bronchus or lung: Secondary | ICD-10-CM | POA: Diagnosis not present

## 2021-08-29 DIAGNOSIS — C3492 Malignant neoplasm of unspecified part of left bronchus or lung: Secondary | ICD-10-CM | POA: Diagnosis not present

## 2021-08-29 DIAGNOSIS — R918 Other nonspecific abnormal finding of lung field: Secondary | ICD-10-CM | POA: Diagnosis not present

## 2021-08-29 DIAGNOSIS — Z510 Encounter for antineoplastic radiation therapy: Secondary | ICD-10-CM | POA: Diagnosis not present

## 2021-08-29 LAB — RAD ONC ARIA SESSION SUMMARY
Course Elapsed Days: 27
Plan Fractions Treated to Date: 16
Plan Prescribed Dose Per Fraction: 2 Gy
Plan Total Fractions Prescribed: 30
Plan Total Prescribed Dose: 60 Gy
Reference Point Dosage Given to Date: 32 Gy
Reference Point Session Dosage Given: 2 Gy
Session Number: 16

## 2021-08-29 NOTE — Progress Notes (Signed)
[address]       [place] [address]             [phone]      HPI: [name] returns for follow-up regarding her lung cancer.  [name] is a 76 year old woman with a history of tobacco abuse, CAD, MI, cardiac arrest, CABG, metachronous stage Ia lung cancers, COPD, hypertension, hyperlipidemia, and reflux.  She had a left upper lobectomy for stage Ia non-small cell carcinoma in 2012.  She then had stereotactic radiation for a second primary in 2022.  Unfortunately she quit smoking after the first cancer.  She did finally quit smoking about a year and a half ago.  She recently had a follow-up which showed a left hilar density.  On PET/CT this was hypermetabolic with an SUV of 27.  I did bronchoscopy and endobronchial ultrasound on 07/27/2021, which showed non-small cell carcinoma.  Her airway was essentially completely occluded.  She was then admitted to the hospital with worsening shortness of breath.  She was found to have complete opacification of the left hemithorax.  She had a chest tube placed but there was minimal drainage and no improvement in aeration.  She has started chemotherapy and radiation.  She is still oxygen dependent.  Has not noticed much improvement of her respiratory status.  Tolerating the treatment well so far.  Past Medical History:  Diagnosis Date   CAD (coronary artery disease)    Cancer ([place])    COPD (chronic obstructive pulmonary disease) ([facility])    per DM note, pt denies   Dyslipidemia    GERD (gastroesophageal reflux disease)    History of radiation therapy    Left lung- 01/16/21-01/30/21- Dr. Gery Pray   HTN (hypertension)    Hypothyroidism    Myocardial infarct [facility])    Non-small cell lung cancer ([place]) 08/19/2010   Stage IA, status post left upper lobectomy July 2012   Tobacco abuse     Current Outpatient Medications  Medication Sig Dispense Refill   acetaminophen (TYLENOL) 500 MG tablet Take 500 mg by mouth  every 6 (six) hours as needed for moderate pain or fever.     albuterol (PROAIR HFA) 108 (90 Base) MCG/ACT inhaler 2 puffs up to every 4 hours as needed only  if your can't catch your breath (Patient taking differently: Inhale 2 puffs into the lungs every 4 (four) hours as needed for shortness of breath.)     albuterol (PROVENTIL) (2.5 MG/3ML) 0.083% nebulizer solution Take 2.5 mg by nebulization 3 (three) times daily as needed for wheezing or shortness of breath.     amiodarone (PACERONE) 200 MG tablet Advised to take amiodarone 400 mg twice daily for 7 days followed by amiodarone 200 mg twice daily for 7 days then followed by amiodarone 200 mg daily until follow-up with cardiology. 50 tablet 0   aspirin 81 MG tablet Take 81 mg by mouth at bedtime.     chlorpheniramine-HYDROcodone 10-8 MG/5ML Take 5 mLs by mouth at bedtime as needed for cough.     Coenzyme Q10 (CO Q 10 PO) Take 1 capsule by mouth daily.     fluconazole (DIFLUCAN) 100 MG tablet Take 1 tablet (100 mg total) by mouth daily. 10 tablet 0   furosemide (LASIX) 20 MG tablet Take 1 tablet po BID for 5 days then go back to taking 1 tablet po QD 90 tablet 1   levothyroxine (SYNTHROID) 25 MCG tablet TAKE 0.5 TABLETS (12.5 MCG  TOTAL) BY MOUTH DAILY. IN ADDITION TO THE 50 MCG. (Patient taking differently: Take 12.5 mcg by mouth See admin instructions. 12.5 mcg daily except on sunday) 45 tablet 1   levothyroxine (SYNTHROID) 50 MCG tablet TAKE 1 TABLET BY MOUTH EVERY DAY BEFORE BREAKFAST (Patient taking differently: Take 50 mcg by mouth See admin instructions. Along with 12.5 mcg qd except on Sunday (pt just takes 50 mcg)) 90 tablet 3   lidocaine-prilocaine (EMLA) cream Apply 1 Application topically as needed. (Patient taking differently: Apply 1 Application topically as needed (prior to port access).) 30 g 2   metoprolol succinate (TOPROL-XL) 100 MG 24 hr tablet Take 1 tablet (100 mg total) by mouth daily. Take with or immediately following a meal.  (Patient taking differently: Take 100 mg by mouth daily.) 90 tablet 1   Omega-3 Fatty Acids (OMEGA-3 PO) Take 1,000 mg by mouth in the morning and at bedtime.     OXYGEN Inhale 3.5 L into the lungs continuous.     potassium chloride SA (KLOR-CON M) 10 MEQ tablet Take 1 tablet (10 mEq total) by mouth daily. 30 tablet 3   prochlorperazine (COMPAZINE) 10 MG tablet Take 1 tablet (10 mg total) by mouth every 6 (six) hours as needed. 30 tablet 2   rosuvastatin (CRESTOR) 40 MG tablet Take 1 tablet (40 mg total) by mouth daily. (Patient taking differently: Take 40 mg by mouth every evening.) 90 tablet 3   Tiotropium Bromide-Olodaterol (STIOLTO RESPIMAT) 2.5-2.5 MCG/ACT AERS Inhale 2 puffs into the lungs daily. 4 g 5   Tiotropium Bromide-Olodaterol (STIOLTO RESPIMAT) 2.5-2.5 MCG/ACT AERS Inhale 2 puffs into the lungs daily. 4 g 0   No current facility-administered medications for this visit.    Physical Exam BP (!) 98/52   Pulse 73   Resp 20   Ht 5\' 3"  (1.6 m)   Wt 208 lb (94.3 kg)   SpO2 90% Comment: 3L O2 per [place]  BMI 36.71 kg/m  76 year old woman in no acute distress Wearing nasal cannula oxygen Cardiac regular rate and rhythm Lungs absent breath sounds left base, diminished but present left upper  Diagnostic Tests: CHEST - 2 VIEW   COMPARISON:  08/22/2021   FINDINGS: Midline sternotomy overlies stable cardiac silhouette. Large LEFT pleural effusion unchanged. LEFT upper lobe opacity unchanged. RIGHT lung clear. Port in the anterior chest wall with tip in distal SVC.   IMPRESSION: 1. No interval change. 2. Large LEFT effusion with LEFT perihilar upper lobe opacity.     Electronically Signed   By: Suzy Bouchard M.D.   On: 08/29/2021 11:58 I personally reviewed the chest x-ray images.  I disagree that the effusion is unchanged.  She actually has better aeration in the upper left chest than she did on her previous film.  There is a hilar density.  Still some combination of fluid  and atelectasis of the left base.  Impression: [name] is a 76 year old woman with a history of tobacco abuse, CAD, MI, cardiac arrest, CABG, metachronous stage Ia lung cancers, COPD, hypertension, hyperlipidemia, and reflux.  She had a left upper lobectomy in 2012 and then stereotactic radiation for a new primary in 2022.  She then returned with a hilar mass which is at least stage II possibly stage III.  That was complicated by complete endobronchial obstruction with atelectasis and effusion.  Cytology from the effusion did not show any malignant cells.  Since starting chemo and radiation she has had some reexpansion of the left upper lobe.  There is still some atelectasis/effusion at the left base.  Unclear how much of this is fluid versus atelectatic lung.  Since she is in the middle of treatment would not recommend any surgical intervention.  If her effusion worsens she can have thoracentesis for symptomatic relief.  Posttreatment she should be rescanned and if there is a confluent fluid collection which could be drained which might help her symptomatically we can assess her for that.   Plan: Follow-up with Dr. Julien Nordmann and Dr. Sondra Come. After she has completed chemotherapy and radiation I would be happy to reassess her to see if scans show collection of fluid that can be drained and that might improve her symptoms.  Melrose Nakayama, MD [facility] [phone]

## 2021-08-30 ENCOUNTER — Other Ambulatory Visit: Payer: Self-pay

## 2021-08-30 ENCOUNTER — Ambulatory Visit
Admission: RE | Admit: 2021-08-30 | Discharge: 2021-08-30 | Disposition: A | Discharge status: Home / Self Care | Payer: Medicare HMO | Source: Ambulatory Visit | Attending: Radiation Oncology | Admitting: Radiation Oncology

## 2021-08-30 DIAGNOSIS — C3492 Malignant neoplasm of unspecified part of left bronchus or lung: Secondary | ICD-10-CM | POA: Diagnosis not present

## 2021-08-30 DIAGNOSIS — C3412 Malignant neoplasm of upper lobe, left bronchus or lung: Secondary | ICD-10-CM | POA: Diagnosis not present

## 2021-08-30 DIAGNOSIS — Z87891 Personal history of nicotine dependence: Secondary | ICD-10-CM | POA: Diagnosis not present

## 2021-08-30 DIAGNOSIS — Z510 Encounter for antineoplastic radiation therapy: Secondary | ICD-10-CM | POA: Diagnosis not present

## 2021-08-30 LAB — RAD ONC ARIA SESSION SUMMARY
Course Elapsed Days: 28
Plan Fractions Treated to Date: 17
Plan Prescribed Dose Per Fraction: 2 Gy
Plan Total Fractions Prescribed: 30
Plan Total Prescribed Dose: 60 Gy
Reference Point Dosage Given to Date: 34 Gy
Reference Point Session Dosage Given: 2 Gy
Session Number: 17

## 2021-08-31 ENCOUNTER — Ambulatory Visit
Admission: RE | Admit: 2021-08-31 | Discharge: 2021-08-31 | Disposition: A | Discharge status: Home / Self Care | Payer: Medicare HMO | Source: Ambulatory Visit | Attending: Radiation Oncology | Admitting: Radiation Oncology

## 2021-08-31 ENCOUNTER — Other Ambulatory Visit: Payer: Self-pay

## 2021-08-31 DIAGNOSIS — Z87891 Personal history of nicotine dependence: Secondary | ICD-10-CM | POA: Diagnosis not present

## 2021-08-31 DIAGNOSIS — C3492 Malignant neoplasm of unspecified part of left bronchus or lung: Secondary | ICD-10-CM | POA: Diagnosis not present

## 2021-08-31 DIAGNOSIS — C3412 Malignant neoplasm of upper lobe, left bronchus or lung: Secondary | ICD-10-CM | POA: Diagnosis not present

## 2021-08-31 DIAGNOSIS — Z510 Encounter for antineoplastic radiation therapy: Secondary | ICD-10-CM | POA: Diagnosis not present

## 2021-08-31 LAB — RAD ONC ARIA SESSION SUMMARY
Course Elapsed Days: 29
Plan Fractions Treated to Date: 18
Plan Prescribed Dose Per Fraction: 2 Gy
Plan Total Fractions Prescribed: 30
Plan Total Prescribed Dose: 60 Gy
Reference Point Dosage Given to Date: 36 Gy
Reference Point Session Dosage Given: 2 Gy
Session Number: 18

## 2021-08-31 MED FILL — Dexamethasone Sodium Phosphate Inj 100 MG/10ML: INTRAMUSCULAR | Qty: 1 | Status: AC

## 2021-09-01 DIAGNOSIS — I480 Paroxysmal atrial fibrillation: Secondary | ICD-10-CM | POA: Insufficient documentation

## 2021-09-01 DIAGNOSIS — E876 Hypokalemia: Secondary | ICD-10-CM | POA: Insufficient documentation

## 2021-09-01 DIAGNOSIS — I4891 Unspecified atrial fibrillation: Secondary | ICD-10-CM | POA: Insufficient documentation

## 2021-09-03 ENCOUNTER — Other Ambulatory Visit: Payer: Medicare HMO

## 2021-09-03 ENCOUNTER — Inpatient Hospital Stay: Payer: Medicare HMO

## 2021-09-03 ENCOUNTER — Other Ambulatory Visit: Payer: Self-pay

## 2021-09-03 ENCOUNTER — Ambulatory Visit
Admission: RE | Admit: 2021-09-03 | Discharge: 2021-09-03 | Disposition: A | Discharge status: Home / Self Care | Payer: Medicare HMO | Source: Ambulatory Visit | Attending: Radiation Oncology | Admitting: Radiation Oncology

## 2021-09-03 ENCOUNTER — Inpatient Hospital Stay: Payer: Medicare HMO | Admitting: Internal Medicine

## 2021-09-03 ENCOUNTER — Encounter: Payer: Self-pay | Admitting: Internal Medicine

## 2021-09-03 VITALS — BP 133/77 | HR 67 | Temp 98.9°F | Resp 20

## 2021-09-03 VITALS — BP 103/53 | HR 65 | Temp 98.5°F | Resp 16 | Wt 207.9 lb

## 2021-09-03 DIAGNOSIS — Z95828 Presence of other vascular implants and grafts: Secondary | ICD-10-CM

## 2021-09-03 DIAGNOSIS — C3412 Malignant neoplasm of upper lobe, left bronchus or lung: Secondary | ICD-10-CM | POA: Diagnosis not present

## 2021-09-03 DIAGNOSIS — C3492 Malignant neoplasm of unspecified part of left bronchus or lung: Secondary | ICD-10-CM

## 2021-09-03 DIAGNOSIS — Z87891 Personal history of nicotine dependence: Secondary | ICD-10-CM | POA: Diagnosis not present

## 2021-09-03 DIAGNOSIS — Z5111 Encounter for antineoplastic chemotherapy: Secondary | ICD-10-CM | POA: Diagnosis not present

## 2021-09-03 DIAGNOSIS — Z510 Encounter for antineoplastic radiation therapy: Secondary | ICD-10-CM | POA: Diagnosis not present

## 2021-09-03 DIAGNOSIS — D649 Anemia, unspecified: Secondary | ICD-10-CM

## 2021-09-03 LAB — CMP (CANCER CENTER ONLY)
ALT: 50 U/L — ABNORMAL HIGH (ref 0–44)
AST: 32 U/L (ref 15–41)
Albumin: 3.2 g/dL — ABNORMAL LOW (ref 3.5–5.0)
Alkaline Phosphatase: 70 U/L (ref 38–126)
Anion gap: 3 — ABNORMAL LOW (ref 5–15)
BUN: 10 mg/dL (ref 8–23)
CO2: 42 mmol/L — ABNORMAL HIGH (ref 22–32)
Calcium: 8.8 mg/dL — ABNORMAL LOW (ref 8.9–10.3)
Chloride: 98 mmol/L (ref 98–111)
Creatinine: 0.75 mg/dL (ref 0.44–1.00)
GFR, Estimated: >60 mL/min (ref >60)
Glucose, Bld: 123 mg/dL — ABNORMAL HIGH (ref 70–99)
Potassium: 3.9 mmol/L (ref 3.5–5.1)
Sodium: 142 mmol/L (ref 135–145)
Total Bilirubin: 0.3 mg/dL (ref 0.3–1.2)
Total Protein: 5.9 g/dL — ABNORMAL LOW (ref 6.5–8.1)

## 2021-09-03 LAB — SAMPLE TO BLOOD BANK

## 2021-09-03 LAB — RAD ONC ARIA SESSION SUMMARY
Course Elapsed Days: 32
Plan Fractions Treated to Date: 19
Plan Prescribed Dose Per Fraction: 2 Gy
Plan Total Fractions Prescribed: 30
Plan Total Prescribed Dose: 60 Gy
Reference Point Dosage Given to Date: 38 Gy
Reference Point Session Dosage Given: 2 Gy
Session Number: 19

## 2021-09-03 LAB — CBC WITH DIFFERENTIAL (CANCER CENTER ONLY)
Abs Immature Granulocytes: 0.14 10*3/uL — ABNORMAL HIGH (ref 0.00–0.07)
Basophils Absolute: 0.1 10*3/uL (ref 0.0–0.1)
Basophils Relative: 2 %
Eosinophils Absolute: 0.1 10*3/uL (ref 0.0–0.5)
Eosinophils Relative: 3 %
HCT: 30.8 % — ABNORMAL LOW (ref 36.0–46.0)
Hemoglobin: 9.1 g/dL — ABNORMAL LOW (ref 12.0–15.0)
Immature Granulocytes: 4 %
Lymphocytes Relative: 15 %
Lymphs Abs: 0.5 10*3/uL — ABNORMAL LOW (ref 0.7–4.0)
MCH: 26.4 pg (ref 26.0–34.0)
MCHC: 29.5 g/dL — ABNORMAL LOW (ref 30.0–36.0)
MCV: 89.3 fL (ref 80.0–100.0)
Monocytes Absolute: 0.4 10*3/uL (ref 0.1–1.0)
Monocytes Relative: 11 %
Neutro Abs: 2.2 10*3/uL (ref 1.7–7.7)
Neutrophils Relative %: 65 %
Platelet Count: 223 10*3/uL (ref 150–400)
RBC: 3.45 MIL/uL — ABNORMAL LOW (ref 3.87–5.11)
RDW: 18 % — ABNORMAL HIGH (ref 11.5–15.5)
WBC Count: 3.4 10*3/uL — ABNORMAL LOW (ref 4.0–10.5)
nRBC: 0 % (ref 0.0–0.2)

## 2021-09-03 MED ORDER — HEPARIN SOD (PORK) LOCK FLUSH 100 UNIT/ML IV SOLN
500.0000 [IU] | Freq: Once | INTRAVENOUS | Status: AC | PRN
  Administered 2021-09-03: 500 [IU]

## 2021-09-03 MED ORDER — PALONOSETRON HCL INJECTION 0.25 MG/5ML
0.2500 mg | Freq: Once | INTRAVENOUS | Status: AC
  Administered 2021-09-03: 0.25 mg via INTRAVENOUS
  Filled 2021-09-03: qty 5

## 2021-09-03 MED ORDER — DIPHENHYDRAMINE HCL 50 MG/ML IJ SOLN
50.0000 mg | Freq: Once | INTRAMUSCULAR | Status: AC
  Administered 2021-09-03: 50 mg via INTRAVENOUS
  Filled 2021-09-03: qty 1

## 2021-09-03 MED ORDER — SODIUM CHLORIDE 0.9 % IV SOLN
197.4000 mg | Freq: Once | INTRAVENOUS | Status: AC
  Administered 2021-09-03: 200 mg via INTRAVENOUS
  Filled 2021-09-03: qty 20

## 2021-09-03 MED ORDER — SODIUM CHLORIDE 0.9% FLUSH
10.0000 mL | Freq: Once | INTRAVENOUS | Status: AC
  Administered 2021-09-03: 10 mL

## 2021-09-03 MED ORDER — SODIUM CHLORIDE 0.9 % IV SOLN
10.0000 mg | Freq: Once | INTRAVENOUS | Status: AC
  Administered 2021-09-03: 10 mg via INTRAVENOUS
  Filled 2021-09-03: qty 10

## 2021-09-03 MED ORDER — SODIUM CHLORIDE 0.9 % IV SOLN
45.0000 mg/m2 | Freq: Once | INTRAVENOUS | Status: AC
  Administered 2021-09-03: 90 mg via INTRAVENOUS
  Filled 2021-09-03: qty 15

## 2021-09-03 MED ORDER — SODIUM CHLORIDE 0.9% FLUSH
10.0000 mL | INTRAVENOUS | Status: DC | PRN
  Administered 2021-09-03: 10 mL

## 2021-09-03 MED ORDER — SODIUM CHLORIDE 0.9 % IV SOLN: Freq: Once | INTRAVENOUS | Status: AC

## 2021-09-03 MED ORDER — FAMOTIDINE IN NACL 20-0.9 MG/50ML-% IV SOLN
20.0000 mg | Freq: Once | INTRAVENOUS | Status: AC
  Administered 2021-09-03: 20 mg via INTRAVENOUS
  Filled 2021-09-03: qty 50

## 2021-09-03 NOTE — Patient Instructions (Signed)
[facility] ONCOLOG[facility]  Discharge Instructions: Thank you for choosing [facility] to provide your oncology and hematology care.   If you have a lab appointment with the [facility], please go directly to the [facility] and check in at the registration area.   Wear comfortable clothing and clothing appropriate for easy access to any Portacath or PICC line.   We strive to give you quality time with your provider. You may need to reschedule your appointment if you arrive late (15 or more minutes).  Arriving late affects you and other patients whose appointments are after yours.  Also, if you miss three or more appointments without notifying the office, you may be dismissed from the clinic at the provider's discretion.      For prescription refill requests, have your pharmacy contact our office and allow 72 hours for refills to be completed.    Today you received the following chemotherapy and/or immunotherapy agents: Paclitaxel (Taxol) and Carboplatin.   To help prevent nausea and vomiting after your treatment, we encourage you to take your nausea medication as directed.  BELOW ARE SYMPTOMS THAT SHOULD BE REPORTED IMMEDIATELY: *FEVER GREATER THAN 100.4 F (38 C) OR HIGHER *CHILLS OR SWEATING *NAUSEA AND VOMITING THAT IS NOT CONTROLLED WITH YOUR NAUSEA MEDICATION *UNUSUAL SHORTNESS OF BREATH *UNUSUAL BRUISING OR BLEEDING *URINARY PROBLEMS (pain or burning when urinating, or frequent urination) *BOWEL PROBLEMS (unusual diarrhea, constipation, pain near the anus) TENDERNESS IN MOUTH AND THROAT WITH OR WITHOUT PRESENCE OF ULCERS (sore throat, sores in mouth, or a toothache) UNUSUAL RASH, SWELLING OR PAIN  UNUSUAL VAGINAL DISCHARGE OR ITCHING   Items with * indicate a potential emergency and should be followed up as soon as possible or go to the Emergency Department if any problems should occur.  Please show the CHEMOTHERAPY ALERT CARD or IMMUNOTHERAPY  ALERT CARD at check-in to the Emergency Department and triage nurse.  Should you have questions after your visit or need to cancel or reschedule your appointment, please contact [facility]  Dept: [phone]  and follow the prompts.  Office hours are 8:00 a.m. to 4:30 p.m. Monday - Friday. Please note that voicemails left after 4:00 p.m. may not be returned until the following business day.  We are closed weekends and major holidays. You have access to a nurse at all times for urgent questions. Please call the main number to the clinic Dept: [phone] and follow the prompts.   For any non-urgent questions, you may also contact your provider using MyChart. We now offer e-Visits for anyone 79 and older to request care online for non-urgent symptoms. For details visit [email or web address].   Also download the MyChart app! Go to the app store, search "MyChart", open the app, select , and log in with your MyChart username and password.  Masks are optional in the cancer centers. If you would like for your care team to wear a mask while they are taking care of you, please let them know. For doctor visits, patients may have with them one support person who is at least 75 years old. At this time, visitors are not allowed in the infusion area.

## 2021-09-03 NOTE — Progress Notes (Signed)
[facility] [phone]   [phone]  OFFICE PROGRESS NOTE  [name], NP [place] [address]  DIAGNOSIS:  Stage IIb/IIIa (T3, N0/N2, M0) non-small cell lung cancer, squamous cell carcinoma presented with left hilar mass with suspicious mediastinal lymphadenopathy on the last CT scan of the chest at [facility].  PRIOR THERAPY: None  CURRENT THERAPY: A course of concurrent chemoradiation with weekly carboplatin for AUC of 2 and paclitaxel 45 Mg/M2.  Status post 3 cycles.  INTERVAL HISTORY: [name] 76 y.o. female returns to the clinic today for follow-up visit accompanied by her husband.  The patient is feeling fine today with no concerning complaints except for the baseline shortness of breath and she is currently on home oxygen.  She also has some mild swallowing issues.  She denied having any chest pain but has cough with no hemoptysis.  She denied having any fever or chills.  She has no nausea, vomiting, diarrhea or constipation.  She continues to tolerate her course of concurrent chemoradiation fairly well.  She is here today for evaluation before starting cycle #4.  MEDICAL HISTORY: Past Medical History:  Diagnosis Date   CAD (coronary artery disease)    Cancer ([facility])    COPD (chronic obstructive pulmonary disease) ([facility])    per DM note, pt denies   Dyslipidemia    GERD (gastroesophageal reflux disease)    History of radiation therapy    Left lung- 01/16/21-01/30/21- Dr. Gery Pray   HTN (hypertension)    Hypothyroidism    Myocardial infarct [facility])    Non-small cell lung cancer ([facility]) 08/19/2010   Stage IA, status post left upper lobectomy July 2012   Tobacco abuse     ALLERGIES:  is allergic to codeine.  MEDICATIONS:  Current Outpatient Medications  Medication Sig Dispense Refill   acetaminophen (TYLENOL) 500 MG tablet Take 500 mg by mouth every 6 (six) hours as needed for moderate pain  or fever.     albuterol (PROAIR HFA) 108 (90 Base) MCG/ACT inhaler 2 puffs up to every 4 hours as needed only  if your can't catch your breath (Patient taking differently: Inhale 2 puffs into the lungs every 4 (four) hours as needed for shortness of breath.)     albuterol (PROVENTIL) (2.5 MG/3ML) 0.083% nebulizer solution Take 2.5 mg by nebulization 3 (three) times daily as needed for wheezing or shortness of breath.     amiodarone (PACERONE) 200 MG tablet Advised to take amiodarone 400 mg twice daily for 7 days followed by amiodarone 200 mg twice daily for 7 days then followed by amiodarone 200 mg daily until follow-up with cardiology. 50 tablet 0   aspirin 81 MG tablet Take 81 mg by mouth at bedtime.     chlorpheniramine-HYDROcodone 10-8 MG/5ML Take 5 mLs by mouth at bedtime as needed for cough.     Coenzyme Q10 (CO Q 10 PO) Take 1 capsule by mouth daily.     fluconazole (DIFLUCAN) 100 MG tablet Take 1 tablet (100 mg total) by mouth daily. 10 tablet 0   furosemide (LASIX) 20 MG tablet Take 1 tablet po BID for 5 days then go back to taking 1 tablet po QD 90 tablet 1   levothyroxine (SYNTHROID) 25 MCG tablet TAKE 0.5 TABLETS (12.5 MCG TOTAL) BY MOUTH DAILY. IN ADDITION TO THE 50 MCG. (Patient taking differently: Take 12.5 mcg by mouth See admin instructions. 12.5 mcg daily except  on sunday) 45 tablet 1   levothyroxine (SYNTHROID) 50 MCG tablet TAKE 1 TABLET BY MOUTH EVERY DAY BEFORE BREAKFAST (Patient taking differently: Take 50 mcg by mouth See admin instructions. Along with 12.5 mcg qd except on Sunday (pt just takes 50 mcg)) 90 tablet 3   lidocaine-prilocaine (EMLA) cream Apply 1 Application topically as needed. (Patient taking differently: Apply 1 Application topically as needed (prior to port access).) 30 g 2   metoprolol succinate (TOPROL-XL) 100 MG 24 hr tablet Take 1 tablet (100 mg total) by mouth daily. Take with or immediately following a meal. (Patient taking differently: Take 100 mg by mouth  daily.) 90 tablet 1   Omega-3 Fatty Acids (OMEGA-3 PO) Take 1,000 mg by mouth in the morning and at bedtime.     OXYGEN Inhale 3.5 L into the lungs continuous.     potassium chloride SA (KLOR-CON M) 10 MEQ tablet Take 1 tablet (10 mEq total) by mouth daily. 30 tablet 3   prochlorperazine (COMPAZINE) 10 MG tablet Take 1 tablet (10 mg total) by mouth every 6 (six) hours as needed. 30 tablet 2   rosuvastatin (CRESTOR) 40 MG tablet Take 1 tablet (40 mg total) by mouth daily. (Patient taking differently: Take 40 mg by mouth every evening.) 90 tablet 3   Tiotropium Bromide-Olodaterol (STIOLTO RESPIMAT) 2.5-2.5 MCG/ACT AERS Inhale 2 puffs into the lungs daily. 4 g 5   Tiotropium Bromide-Olodaterol (STIOLTO RESPIMAT) 2.5-2.5 MCG/ACT AERS Inhale 2 puffs into the lungs daily. 4 g 0   No current facility-administered medications for this visit.    SURGICAL HISTORY:  Past Surgical History:  Procedure Laterality Date   CARDIAC CATHETERIZATION  07/06/2010   see CABG report - pt sent to OR   CARDIOVASCULAR STRESS TEST  09/11/2010   R/S MV - normal perfusion in all regions, EF 46%, no scintigraphic evidence of inducible myocardial ischemia; global LV systolic function mildly reduced; no significant wall motion abnormalities noted; Exercise capacity 7 METS; EKG negative for ischemia; low risk study, no signifcant change from previous study 08/2003   CORONARY ARTERY BYPASS GRAFT  07/11/2010   LIMA to LAD; SVG to 2nd branch OM; SVG to posterior descending artery   IR IMAGING GUIDED PORT INSERTION  08/16/2021   LEFT VATS  09/17/2010   TEE WITHOUT CARDIOVERSION  07/06/2010   during emergent CABG surgery; 2-3+ mitral regurgitation   VIDEO BRONCHOSCOPY N/A 07/27/2021   Procedure: VIDEO BRONCHOSCOPY;  Surgeon: Melrose Nakayama, MD;  Location: [facility];  Service: Thoracic;  Laterality: N/A;   VIDEO BRONCHOSCOPY WITH ENDOBRONCHIAL ULTRASOUND N/A 07/27/2021   Procedure: VIDEO BRONCHOSCOPY WITH ENDOBRONCHIAL ULTRASOUND;   Surgeon: Melrose Nakayama, MD;  Location: [facility];  Service: Thoracic;  Laterality: N/A;    REVIEW OF SYSTEMS:  A comprehensive review of systems was negative except for: Constitutional: positive for fatigue Respiratory: positive for cough and dyspnea on exertion   PHYSICAL EXAMINATION: General appearance: alert, cooperative, fatigued, and no distress Head: Normocephalic, without obvious abnormality, atraumatic Neck: no adenopathy, no JVD, supple, symmetrical, trachea midline, and thyroid not enlarged, symmetric, no tenderness/mass/nodules Lymph nodes: Cervical, supraclavicular, and axillary nodes normal. Resp: clear to auscultation bilaterally Back: symmetric, no curvature. ROM normal. No CVA tenderness. Cardio: regular rate and rhythm, S1, S2 normal, no murmur, click, rub or gallop GI: soft, non-tender; bowel sounds normal; no masses,  no organomegaly Extremities: extremities normal, atraumatic, no cyanosis or edema  ECOG PERFORMANCE STATUS: 1 - Symptomatic but completely ambulatory  Blood pressure (!) 103/53,  pulse 65, temperature 98.5 F (36.9 C), temperature source Oral, resp. rate 16, weight 207 lb 14.4 oz (94.3 kg), SpO2 97 %.  LABORATORY DATA: Lab Results  Component Value Date   WBC 3.4 (L) 09/03/2021   HGB 9.1 (L) 09/03/2021   HCT 30.8 (L) 09/03/2021   MCV 89.3 09/03/2021   PLT 223 09/03/2021      Chemistry      Component Value Date/Time   NA 142 09/03/2021 0758   NA 143 05/02/2021 1055   K 3.9 09/03/2021 0758   CL 98 09/03/2021 0758   CO2 42 (H) 09/03/2021 0758   BUN 10 09/03/2021 0758   BUN 13 05/02/2021 1055   CREATININE 0.75 09/03/2021 0758   CREATININE 0.81 07/02/2016 1323      Component Value Date/Time   CALCIUM 8.8 (L) 09/03/2021 0758   ALKPHOS 70 09/03/2021 0758   AST 32 09/03/2021 0758   ALT 50 (H) 09/03/2021 0758   BILITOT 0.3 09/03/2021 0758       RADIOGRAPHIC STUDIES: DG Chest 2 View  Result Date: 08/29/2021 CLINICAL DATA:  History of  lung cancer EXAM: CHEST - 2 VIEW COMPARISON:  08/22/2021 FINDINGS: Midline sternotomy overlies stable cardiac silhouette. Large LEFT pleural effusion unchanged. LEFT upper lobe opacity unchanged. RIGHT lung clear. Port in the anterior chest wall with tip in distal SVC. IMPRESSION: 1. No interval change. 2. Large LEFT effusion with LEFT perihilar upper lobe opacity. Electronically Signed   By: Suzy Bouchard M.D.   On: 08/29/2021 11:58   DG Chest 2 View  Result Date: 08/22/2021 CLINICAL DATA:  Follow up left lung collapse related to left hilar mass. EXAM: CHEST - 2 VIEW COMPARISON:  08/07/2021 FINDINGS: Small amount of aerated left upper lobe noted, increased from prior, only 5-10% of left hemithoracic volume. Power injectable right Port-A-Cath tip: SVC. Prior CABG. Atherosclerotic calcification of the aortic arch. Borderline enlargement of the cardiopericardial silhouette. The right lung appears clear. No significant leftward shift of the mediastinum. Old left lateral rib deformity. IMPRESSION: 1. Nearly completely opacified left hemithorax. Small amount of increased aeration in the left upper lobe but only amounting to about 5-10% of left hemithoracic volume. The remainder of the left hemithorax appears opacified. No shift of cardiac or mediastinal structures. 2.  Aortic Atherosclerosis (ICD10-I70.0).  Prior CABG. Electronically Signed   By: Van Clines M.D.   On: 08/22/2021 10:43   IR IMAGING GUIDED PORT INSERTION  Result Date: 08/16/2021 INDICATION: 76 year old female referred for port catheter We will be unable to proceed with sedation given the patient's known left-sided lung collapse, oxygen requirement, and CO2 retention. EXAM: IMAGE GUIDED PORT CATHETER MEDICATIONS: None ANESTHESIA/SEDATION: Moderate (conscious) sedation was not employed during this procedure. A total of Versed 0.5 mg and Fentanyl 0 mcg was administered intravenously. Moderate Sedation Time: 0 minutes. The patient's level of  consciousness and vital signs were monitored continuously by radiology nursing throughout the procedure under my direct supervision. FLUOROSCOPY TIME:  Fluoroscopy Time: 0 minutes 30 seconds (6 mGy). COMPLICATIONS: None PROCEDURE: The procedure, risks, benefits, and alternatives were explained to the patient. Questions regarding the procedure were encouraged and answered. The patient understands and consents to the procedure. Ultrasound survey was performed with images stored and sent to PACs. Right IJ vein documented to be patent. The right neck and chest was prepped with chlorhexidine, and draped in the usual sterile fashion using maximum barrier technique (cap and mask, sterile gown, sterile gloves, large sterile sheet, hand hygiene and cutaneous antiseptic).  Local anesthesia was attained by infiltration with 1% lidocaine without epinephrine. Ultrasound demonstrated patency of the right internal jugular vein, and this was documented with an image. Under real-time ultrasound guidance, this vein was accessed with a 21 gauge micropuncture needle and image documentation was performed. A small dermatotomy was made at the access site with an 11 scalpel. A 0.018" wire was advanced into the SVC and used to estimate the length of the internal catheter. The access needle exchanged for a 3F micropuncture vascular sheath. The 0.018" wire was then removed and a 0.035" wire advanced into the IVC. An appropriate location for the subcutaneous reservoir was selected below the clavicle and an incision was made through the skin and underlying soft tissues. The subcutaneous tissues were then dissected using a combination of blunt and sharp surgical technique and a pocket was formed. A single lumen power injectable portacatheter was then tunneled through the subcutaneous tissues from the pocket to the dermatotomy and the port reservoir placed within the subcutaneous pocket. The venous access site was then serially dilated and a peel  away vascular sheath placed over the wire. The wire was removed and the port catheter advanced into position under fluoroscopic guidance. Some redundancy in the catheter was reduced by removing the catheter at the hub of the port apparatus and straightening the catheter with the use of an 018 wire. The catheter was then reattached at the hub and check for leaks. The catheter tip is positioned in the cavoatrial junction. This was documented with a spot image. The portacatheter was then tested and found to flush and aspirate well. The port was flushed with saline followed by 100 units/mL heparinized saline. The pocket was then closed in two layers using first subdermal inverted interrupted absorbable sutures followed by a running subcuticular suture. The epidermis was then sealed with Dermabond. The dermatotomy at the venous access site was also seal with Dermabond. Patient tolerated the procedure well and remained hemodynamically stable throughout. No complications encountered and no significant blood loss encountered IMPRESSION: Status post right IJ port catheter placement. Signed, Dulcy Fanny. Nadene Rubins, RPVI [facility] Rad[facility] Electronically Signed   By: Corrie Mckusick D.O.   On: 08/16/2021 15:14   MR BRAIN W WO CONTRAST  Result Date: 08/10/2021 CLINICAL DATA:  Evaluate for metastatic disease EXAM: MRI HEAD WITHOUT AND WITH CONTRAST TECHNIQUE: Multiplanar, multiecho pulse sequences of the brain and surrounding structures were obtained without and with intravenous contrast. CONTRAST:  9.43mL GADAVIST GADOBUTROL 1 MMOL/ML IV SOLN COMPARISON:  CT head dated 1 day prior FINDINGS: Brain: There is no acute intracranial hemorrhage, extra-axial fluid collection, or acute infarct. Parenchymal volume is normal for age. The ventricles are normal in size. Patchy FLAIR signal abnormality throughout the subcortical and periventricular white matter likely reflects sequela  of chronic white matter microangiopathy. Curvilinear SWI signal dropout in both parieto-occipital regions is consistent with sequela of prior hemorrhage. Additional punctate chronic microhemorrhages are noted both frontal lobes predominantly peripherally. There is no abnormal enhancement. The finding on the prior head CT was likely artifactual, as there is no abnormal enhancement in the pons on the current study. There is no suspicious parenchymal signal abnormality. There is no mass effect or midline shift. Vascular: Normal flow voids. Skull and upper cervical spine: Normal marrow signal. Sinuses/Orbits: The paranasal sinuses are clear. The globes and orbits are unremarkable. Other: None. IMPRESSION: 1. No evidence of intracranial metastatic disease. The finding on the prior head CT was likely  artifactual. 2. Scattered foci of chronic microhemorrhage predominantly peripherally distributed raise suspicion for cerebral amyloid angiopathy. Electronically Signed   By: Valetta Mole M.D.   On: 08/10/2021 16:20   CT Head W or Wo Contrast  Result Date: 08/09/2021 CLINICAL DATA:  Non-small cell lung cancer, evaluate for brain Mets prior to anticoagulation EXAM: CT HEAD WITHOUT AND WITH CONTRAST TECHNIQUE: Contiguous axial images were obtained from the base of the skull through the vertex without and with intravenous contrast. RADIATION DOSE REDUCTION: This exam was performed according to the departmental dose-optimization program which includes automated exposure control, adjustment of the mA and/or kV according to patient size and/or use of iterative reconstruction technique. CONTRAST:  75mL OMNIPAQUE IOHEXOL 300 MG/ML  SOLN COMPARISON:  None Available. FINDINGS: Brain: 3 mm focus of enhancement in the pons (series 11, image 27), which is nonspecific and could represent an enhancing lesion versus a vascular lesion such as a capillary telangiectasia. No additional foci of enhancement are seen. No evidence of acute  infarction, hemorrhage, cerebral edema, mass effect, or midline shift. No hydrocephalus or extra-axial fluid collection. Vascular: No hyperdense vessel. Normal enhancement of the circle-of-Willis. Skull: Hyperostosis frontalis. Negative for fracture or focal lesion. Sinuses/Orbits: No acute finding. Other: The mastoid air cells are well aerated. IMPRESSION: 3 mm focus of enhancement in the pons, which is nonspecific and could represent an enhancing lesion versus a vascular lesion such as a capillary telangiectasia. An MRI of the brain with and without contrast may help distinguish the etiology, and is far more sensitive for small metastatic lesions. Electronically Signed   By: Merilyn Baba M.D.   On: 08/09/2021 02:05   CT CHEST W CONTRAST  Result Date: 08/07/2021 CLINICAL DATA:  Pleural effusion, malignancy suspected EXAM: CT CHEST WITH CONTRAST TECHNIQUE: Multidetector CT imaging of the chest was performed during intravenous contrast administration. RADIATION DOSE REDUCTION: This exam was performed according to the departmental dose-optimization program which includes automated exposure control, adjustment of the mA and/or kV according to patient size and/or use of iterative reconstruction technique. CONTRAST:  11mL OMNIPAQUE IOHEXOL 300 MG/ML  SOLN COMPARISON:  Pet CT 06/08/2021, chest x-ray 07/25/2021, chest x-ray 08/07/2021 FINDINGS: Cardiovascular: Normal heart size. No significant pericardial effusion. The thoracic aorta is normal in caliber. No atherosclerotic plaque of the thoracic aorta. Four-vessel coronary artery calcifications status post coronary artery bypass graft. The main pulmonary artery is normal in caliber. No central or segmental pulmonary embolus. Mediastinum/Nodes: Known left hilar mass lesion poorly visualized measuring up to at least 4 cm. No right hilar lymphadenopathy. No enlarged mediastinal or axillary lymph nodes. Thyroid gland, trachea, and esophagus demonstrate no significant  findings. Lungs/Pleura: Interval complete hypodense heterogeneous opacification of the left lung consistent. Interval development of a at least small volume left pleural effusion. The right lung is clear. No right pleural effusion. No pneumothorax. Upper Abdomen: Subcentimeter splenule (2:124). No acute abnormality. Musculoskeletal: No chest wall abnormality. No suspicious lytic or blastic osseous lesions. No acute displaced fracture. Multilevel degenerative changes of the spine. Intact sternotomy wires. IMPRESSION: Interval development of postobstructive infection/collapse of the entire left lung in the setting of known left hilar mass lesion and interval development of at least small volume left pleural effusion. Electronically Signed   By: Iven Finn M.D.   On: 08/07/2021 17:38   DG Chest Port 1 View  Result Date: 08/07/2021 CLINICAL DATA:  Shortness of breath EXAM: PORTABLE CHEST 1 VIEW COMPARISON:  Chest radiograph dated July 26, 2021 FINDINGS: Evaluation of cardiomediastinal  silhouette is limited due to complete opacification of the left hemithorax. Right lung is clear. Sternotomy wires representing prior coronary artery bypass grafting unchanged. IMPRESSION: Complete opacification of the left hemithorax, which may represent atelectasis or effusion. Right lung is clear. Electronically Signed   By: Keane Police D.O.   On: 08/07/2021 14:22    ASSESSMENT AND PLAN: This is a very pleasant 76 years old white female with Stage IIb/IIIa (T3, N0/N2, M0) non-small cell lung cancer, squamous cell carcinoma presented with left hilar mass with suspicious mediastinal lymphadenopathy on the last CT scan of the chest at [facility]. She also has a history of stage Ia non-small cell lung cancer, adenocarcinoma status post surgical resection in July 2012 as well as recurrent stage Ia non-small cell lung cancer, squamous cell carcinoma status post SBRT completed January 30, 2021. The patient is currently  undergoing a course of concurrent chemoradiation with weekly carboplatin for AUC of 2 and paclitaxel 45 Mg/M2 status post 3 cycles.   The patient continues to tolerate this treatment fairly well with no concerning adverse effects except for mild fatigue and cough. I recommended for her to proceed with cycle #4 today as planned. I will see the patient back for follow-up visit in 2 weeks for evaluation before starting cycle #6. The patient was advised to call immediately if she has any other concerning symptoms in the interval. The patient voices understanding of current disease status and treatment options and is in agreement with the current care plan.  All questions were answered. The patient knows to call the clinic with any problems, questions or concerns. We can certainly see the patient much sooner if necessary.  The total time spent in the appointment was 20 minutes.  Disclaimer: This note was dictated with voice recognition software. Similar sounding words can inadvertently be transcribed and may not be corrected upon review.

## 2021-09-04 ENCOUNTER — Ambulatory Visit: Payer: Medicare HMO

## 2021-09-04 ENCOUNTER — Ambulatory Visit
Admission: RE | Admit: 2021-09-04 | Discharge: 2021-09-04 | Disposition: A | Discharge status: Home / Self Care | Payer: Medicare HMO | Source: Ambulatory Visit | Attending: Radiation Oncology | Admitting: Radiation Oncology

## 2021-09-04 ENCOUNTER — Other Ambulatory Visit: Payer: Self-pay

## 2021-09-04 DIAGNOSIS — Z87891 Personal history of nicotine dependence: Secondary | ICD-10-CM | POA: Diagnosis not present

## 2021-09-04 DIAGNOSIS — Z510 Encounter for antineoplastic radiation therapy: Secondary | ICD-10-CM | POA: Diagnosis not present

## 2021-09-04 DIAGNOSIS — C3492 Malignant neoplasm of unspecified part of left bronchus or lung: Secondary | ICD-10-CM | POA: Diagnosis not present

## 2021-09-04 DIAGNOSIS — C3412 Malignant neoplasm of upper lobe, left bronchus or lung: Secondary | ICD-10-CM | POA: Diagnosis not present

## 2021-09-04 LAB — RAD ONC ARIA SESSION SUMMARY
Course Elapsed Days: 33
Plan Fractions Treated to Date: 20
Plan Prescribed Dose Per Fraction: 2 Gy
Plan Total Fractions Prescribed: 30
Plan Total Prescribed Dose: 60 Gy
Reference Point Dosage Given to Date: 40 Gy
Reference Point Session Dosage Given: 2 Gy
Session Number: 20

## 2021-09-05 ENCOUNTER — Other Ambulatory Visit: Payer: Self-pay

## 2021-09-05 ENCOUNTER — Ambulatory Visit
Admission: RE | Admit: 2021-09-05 | Discharge: 2021-09-05 | Disposition: A | Discharge status: Home / Self Care | Payer: Medicare HMO | Source: Ambulatory Visit | Attending: Radiation Oncology | Admitting: Radiation Oncology

## 2021-09-05 DIAGNOSIS — Z510 Encounter for antineoplastic radiation therapy: Secondary | ICD-10-CM | POA: Diagnosis not present

## 2021-09-05 DIAGNOSIS — C3412 Malignant neoplasm of upper lobe, left bronchus or lung: Secondary | ICD-10-CM | POA: Diagnosis not present

## 2021-09-05 DIAGNOSIS — Z87891 Personal history of nicotine dependence: Secondary | ICD-10-CM | POA: Diagnosis not present

## 2021-09-05 DIAGNOSIS — C3492 Malignant neoplasm of unspecified part of left bronchus or lung: Secondary | ICD-10-CM | POA: Diagnosis not present

## 2021-09-05 LAB — RAD ONC ARIA SESSION SUMMARY
Course Elapsed Days: 34
Plan Fractions Treated to Date: 21
Plan Prescribed Dose Per Fraction: 2 Gy
Plan Total Fractions Prescribed: 30
Plan Total Prescribed Dose: 60 Gy
Reference Point Dosage Given to Date: 42 Gy
Reference Point Session Dosage Given: 2 Gy
Session Number: 21

## 2021-09-05 NOTE — Progress Notes (Signed)
[facility] OFFICE PROGRESS NOTE  [name], NP [place] [address]  DIAGNOSIS: Stage IIb/IIIa (T3, N0/N2, M0) non-small cell lung cancer, squamous cell carcinoma presented with left hilar mass with suspicious mediastinal lymphadenopathy on the last CT scan of the chest at [place].  PRIOR THERAPY: None  CURRENT THERAPY:  A course of concurrent chemoradiation with weekly carboplatin for AUC of 2 and paclitaxel 45 Mg/M2.  Status post 4 cycles.  INTERVAL HISTORY: [name] 76 y.o. female returns to the clinic today for a follow-up visit accompanied by her husband.  The patient was last seen in clinic by Dr. Julien Nordmann on 09/03/2021.  She was coming in for cycle #4.  He is currently undergoing concurrent chemoradiation.  She is tolerated this well without any concerning adverse side effects. She only has mild odynophagia and dysphagia intermittently. Generally, if she drinks water and chews her food thoroughly, she denies dysphagia.  Today she denies any fever, chills, or night sweats.  She reports she is eating well.  She believes she gained weight.  She reports her baseline dyspnea on exertion for which she is currently on home oxygen.  She denies any changes in her shortness of breath.  She states that her cough has actually improved and she is no longer coughing.  Denies any hemoptysis or chest pain.  Denies any nausea, vomiting, diarrhea, or constipation.  Denies any headache or visual changes.  Her last day radiation is tentatively scheduled for 09/18/2021.  She is here today for evaluation to review her blood work before starting cycle #5.    MEDICAL HISTORY: Past Medical History:  Diagnosis Date   CAD (coronary artery disease)    Cancer ([place])    COPD (chronic obstructive pulmonary disease) ([facility])    per DM note, pt denies   Dyslipidemia    GERD (gastroesophageal reflux disease)    History of radiation therapy    Left lung-  01/16/21-01/30/21- Dr. Gery Pray   HTN (hypertension)    Hypothyroidism    Myocardial infarct [facility])    Non-small cell lung cancer ([place]) 08/19/2010   Stage IA, status post left upper lobectomy July 2012   Tobacco abuse     ALLERGIES:  is allergic to codeine.  MEDICATIONS:  Current Outpatient Medications  Medication Sig Dispense Refill   acetaminophen (TYLENOL) 500 MG tablet Take 500 mg by mouth every 6 (six) hours as needed for moderate pain or fever.     albuterol (PROAIR HFA) 108 (90 Base) MCG/ACT inhaler 2 puffs up to every 4 hours as needed only  if your can't catch your breath (Patient taking differently: Inhale 2 puffs into the lungs every 4 (four) hours as needed for shortness of breath.)     albuterol (PROVENTIL) (2.5 MG/3ML) 0.083% nebulizer solution Take 2.5 mg by nebulization 3 (three) times daily as needed for wheezing or shortness of breath.     amiodarone (PACERONE) 200 MG tablet Advised to take amiodarone 400 mg twice daily for 7 days followed by amiodarone 200 mg twice daily for 7 days then followed by amiodarone 200 mg daily until follow-up with cardiology. 50 tablet 0   aspirin 81 MG tablet Take 81 mg by mouth at bedtime.     chlorpheniramine-HYDROcodone 10-8 MG/5ML Take 5 mLs by mouth at bedtime as needed for cough.     Coenzyme Q10 (CO Q 10 PO) Take 1 capsule by mouth daily.     fluconazole (DIFLUCAN)  100 MG tablet Take 1 tablet (100 mg total) by mouth daily. (Patient not taking: Reported on 09/03/2021) 10 tablet 0   furosemide (LASIX) 20 MG tablet Take 1 tablet po BID for 5 days then go back to taking 1 tablet po QD 90 tablet 1   levothyroxine (SYNTHROID) 25 MCG tablet TAKE 0.5 TABLETS (12.5 MCG TOTAL) BY MOUTH DAILY. IN ADDITION TO THE 50 MCG. (Patient taking differently: Take 12.5 mcg by mouth See admin instructions. 12.5 mcg daily except on sunday) 45 tablet 1   levothyroxine (SYNTHROID) 50 MCG tablet TAKE 1 TABLET BY MOUTH EVERY DAY BEFORE BREAKFAST (Patient taking  differently: Take 50 mcg by mouth See admin instructions. Along with 12.5 mcg qd except on Sunday (pt just takes 50 mcg)) 90 tablet 3   lidocaine-prilocaine (EMLA) cream Apply 1 Application topically as needed. (Patient taking differently: Apply 1 Application topically as needed (prior to port access).) 30 g 2   metoprolol succinate (TOPROL-XL) 100 MG 24 hr tablet Take 1 tablet (100 mg total) by mouth daily. Take with or immediately following a meal. (Patient taking differently: Take 100 mg by mouth daily.) 90 tablet 1   Omega-3 Fatty Acids (OMEGA-3 PO) Take 1,000 mg by mouth in the morning and at bedtime.     OXYGEN Inhale 3.5 L into the lungs continuous.     potassium chloride SA (KLOR-CON M) 10 MEQ tablet Take 1 tablet (10 mEq total) by mouth daily. 30 tablet 3   prochlorperazine (COMPAZINE) 10 MG tablet Take 1 tablet (10 mg total) by mouth every 6 (six) hours as needed. 30 tablet 2   rosuvastatin (CRESTOR) 40 MG tablet Take 1 tablet (40 mg total) by mouth daily. (Patient taking differently: Take 40 mg by mouth every evening.) 90 tablet 3   Tiotropium Bromide-Olodaterol (STIOLTO RESPIMAT) 2.5-2.5 MCG/ACT AERS Inhale 2 puffs into the lungs daily. 4 g 5   Tiotropium Bromide-Olodaterol (STIOLTO RESPIMAT) 2.5-2.5 MCG/ACT AERS Inhale 2 puffs into the lungs daily. 4 g 0   No current facility-administered medications for this visit.    SURGICAL HISTORY:  Past Surgical History:  Procedure Laterality Date   CARDIAC CATHETERIZATION  07/06/2010   see CABG report - pt sent to OR   CARDIOVASCULAR STRESS TEST  09/11/2010   R/S MV - normal perfusion in all regions, EF 46%, no scintigraphic evidence of inducible myocardial ischemia; global LV systolic function mildly reduced; no significant wall motion abnormalities noted; Exercise capacity 7 METS; EKG negative for ischemia; low risk study, no signifcant change from previous study 08/2003   CORONARY ARTERY BYPASS GRAFT  07/11/2010   LIMA to LAD; SVG to 2nd  branch OM; SVG to posterior descending artery   IR IMAGING GUIDED PORT INSERTION  08/16/2021   LEFT VATS  09/17/2010   TEE WITHOUT CARDIOVERSION  07/06/2010   during emergent CABG surgery; 2-3+ mitral regurgitation   VIDEO BRONCHOSCOPY N/A 07/27/2021   Procedure: VIDEO BRONCHOSCOPY;  Surgeon: Melrose Nakayama, MD;  Location: [facility];  Service: Thoracic;  Laterality: N/A;   VIDEO BRONCHOSCOPY WITH ENDOBRONCHIAL ULTRASOUND N/A 07/27/2021   Procedure: VIDEO BRONCHOSCOPY WITH ENDOBRONCHIAL ULTRASOUND;  Surgeon: Melrose Nakayama, MD;  Location: [facility];  Service: Thoracic;  Laterality: N/A;    REVIEW OF SYSTEMS:   Review of Systems  Constitutional: Positive for baseline fatigue. Negative for appetite change, chills, fever and unexpected weight change.  HENT:   Negative for mouth sores, nosebleeds, sore throat and trouble swallowing.   Eyes: Negative for eye  problems and icterus.  Respiratory: Positive for cough and shortness of breath.  Negative for hemoptysis and wheezing.  Cardiovascular: Negative for chest pain and leg swelling.  Gastrointestinal: Negative for abdominal pain, constipation, diarrhea, nausea and vomiting.  Genitourinary: Negative for bladder incontinence, difficulty urinating, dysuria, frequency and hematuria.   Musculoskeletal: Negative for back pain, gait problem, neck pain and neck stiffness.  Skin: Negative for itching and rash.  Neurological: Negative for dizziness, extremity weakness, gait problem, headaches, light-headedness and seizures.  Hematological: Negative for adenopathy. Does not bruise/bleed easily.  Psychiatric/Behavioral: Negative for confusion, depression and sleep disturbance. The patient is not nervous/anxious.     PHYSICAL EXAMINATION:  Blood pressure (!) 106/59, pulse 70, temperature 98.4 F (36.9 C), resp. rate 18, SpO2 98 %.  ECOG PERFORMANCE STATUS: 1  Physical Exam  Constitutional: Oriented to person, place, and time and well-developed,  well-nourished, and in no distress.  HENT:  Head: Normocephalic and atraumatic.  Mouth/Throat: Oropharynx is clear and moist. No oropharyngeal exudate.  Eyes: Conjunctivae are normal. Right eye exhibits no discharge. Left eye exhibits no discharge. No scleral icterus.  Neck: Normal range of motion. Neck supple.  Cardiovascular: Normal rate, regular rhythm, normal heart sounds and intact distal pulses.   Pulmonary/Chest: Effort normal. No respiratory distress. No wheezes. No rales.  On supplemental oxygen via nasal cannula.   Abdominal: Soft. Bowel sounds are normal. Exhibits no distension and no mass. There is no tenderness.  Musculoskeletal: Normal range of motion. Exhibits no edema.  Lymphadenopathy:    No cervical adenopathy.  Neurological: Alert and oriented to person, place, and time. Exhibits also wasting.  Examined in the wheelchair.  Skin: Skin is warm and dry. No rash noted. Not diaphoretic. No erythema. No pallor.  Psychiatric: Mood, memory and judgment normal.  Vitals reviewed.  LABORATORY DATA: Lab Results  Component Value Date   WBC 3.0 (L) 09/10/2021   HGB 9.1 (L) 09/10/2021   HCT 30.6 (L) 09/10/2021   MCV 89.0 09/10/2021   PLT 171 09/10/2021      Chemistry      Component Value Date/Time   NA 141 09/10/2021 1129   NA 143 05/02/2021 1055   K 4.2 09/10/2021 1129   CL 99 09/10/2021 1129   CO2 38 (H) 09/10/2021 1129   BUN 9 09/10/2021 1129   BUN 13 05/02/2021 1055   CREATININE 0.76 09/10/2021 1129   CREATININE 0.81 07/02/2016 1323      Component Value Date/Time   CALCIUM 9.1 09/10/2021 1129   ALKPHOS 66 09/10/2021 1129   AST 23 09/10/2021 1129   ALT 32 09/10/2021 1129   BILITOT 0.4 09/10/2021 1129       RADIOGRAPHIC STUDIES:  DG Chest 2 View  Result Date: 08/29/2021 CLINICAL DATA:  History of lung cancer EXAM: CHEST - 2 VIEW COMPARISON:  08/22/2021 FINDINGS: Midline sternotomy overlies stable cardiac silhouette. Large LEFT pleural effusion unchanged.  LEFT upper lobe opacity unchanged. RIGHT lung clear. Port in the anterior chest wall with tip in distal SVC. IMPRESSION: 1. No interval change. 2. Large LEFT effusion with LEFT perihilar upper lobe opacity. Electronically Signed   By: Suzy Bouchard M.D.   On: 08/29/2021 11:58   DG Chest 2 View  Result Date: 08/22/2021 CLINICAL DATA:  Follow up left lung collapse related to left hilar mass. EXAM: CHEST - 2 VIEW COMPARISON:  08/07/2021 FINDINGS: Small amount of aerated left upper lobe noted, increased from prior, only 5-10% of left hemithoracic volume. Power injectable right Port-A-Cath tip:  SVC. Prior CABG. Atherosclerotic calcification of the aortic arch. Borderline enlargement of the cardiopericardial silhouette. The right lung appears clear. No significant leftward shift of the mediastinum. Old left lateral rib deformity. IMPRESSION: 1. Nearly completely opacified left hemithorax. Small amount of increased aeration in the left upper lobe but only amounting to about 5-10% of left hemithoracic volume. The remainder of the left hemithorax appears opacified. No shift of cardiac or mediastinal structures. 2.  Aortic Atherosclerosis (ICD10-I70.0).  Prior CABG. Electronically Signed   By: Van Clines M.D.   On: 08/22/2021 10:43   IR IMAGING GUIDED PORT INSERTION  Result Date: 08/16/2021 INDICATION: 76 year old female referred for port catheter We will be unable to proceed with sedation given the patient's known left-sided lung collapse, oxygen requirement, and CO2 retention. EXAM: IMAGE GUIDED PORT CATHETER MEDICATIONS: None ANESTHESIA/SEDATION: Moderate (conscious) sedation was not employed during this procedure. A total of Versed 0.5 mg and Fentanyl 0 mcg was administered intravenously. Moderate Sedation Time: 0 minutes. The patient's level of consciousness and vital signs were monitored continuously by radiology nursing throughout the procedure under my direct supervision. FLUOROSCOPY TIME:   Fluoroscopy Time: 0 minutes 30 seconds (6 mGy). COMPLICATIONS: None PROCEDURE: The procedure, risks, benefits, and alternatives were explained to the patient. Questions regarding the procedure were encouraged and answered. The patient understands and consents to the procedure. Ultrasound survey was performed with images stored and sent to PACs. Right IJ vein documented to be patent. The right neck and chest was prepped with chlorhexidine, and draped in the usual sterile fashion using maximum barrier technique (cap and mask, sterile gown, sterile gloves, large sterile sheet, hand hygiene and cutaneous antiseptic). Local anesthesia was attained by infiltration with 1% lidocaine without epinephrine. Ultrasound demonstrated patency of the right internal jugular vein, and this was documented with an image. Under real-time ultrasound guidance, this vein was accessed with a 21 gauge micropuncture needle and image documentation was performed. A small dermatotomy was made at the access site with an 11 scalpel. A 0.018" wire was advanced into the SVC and used to estimate the length of the internal catheter. The access needle exchanged for a 25F micropuncture vascular sheath. The 0.018" wire was then removed and a 0.035" wire advanced into the IVC. An appropriate location for the subcutaneous reservoir was selected below the clavicle and an incision was made through the skin and underlying soft tissues. The subcutaneous tissues were then dissected using a combination of blunt and sharp surgical technique and a pocket was formed. A single lumen power injectable portacatheter was then tunneled through the subcutaneous tissues from the pocket to the dermatotomy and the port reservoir placed within the subcutaneous pocket. The venous access site was then serially dilated and a peel away vascular sheath placed over the wire. The wire was removed and the port catheter advanced into position under fluoroscopic guidance. Some  redundancy in the catheter was reduced by removing the catheter at the hub of the port apparatus and straightening the catheter with the use of an 018 wire. The catheter was then reattached at the hub and check for leaks. The catheter tip is positioned in the cavoatrial junction. This was documented with a spot image. The portacatheter was then tested and found to flush and aspirate well. The port was flushed with saline followed by 100 units/mL heparinized saline. The pocket was then closed in two layers using first subdermal inverted interrupted absorbable sutures followed by a running subcuticular suture. The epidermis was then sealed with Dermabond.  The dermatotomy at the venous access site was also seal with Dermabond. Patient tolerated the procedure well and remained hemodynamically stable throughout. No complications encountered and no significant blood loss encountered IMPRESSION: Status post right IJ port catheter placement. Signed, Dulcy Fanny. Nadene Rubins, RPVI [facility] Radiology Electronically Signed   By: Corrie Mckusick D.O.   On: 08/16/2021 15:14     ASSESSMENT/PLAN:  This is a very pleasant 76 year old Caucasian female with stage IIb/IIIa (T3, and 0/N2, M0) non-small cell lung cancer, squamous of carcinoma.  She presented with a left hilar mass with suspicious mediastinal lymphadenopathy.  The patient also has a history of stage Ia non-small cell lung cancer, adenocarcinoma status post surgical resection in July 2012 as well as recurrent stage Ia non-small cell lung cancer, squamous cell carcinoma status post SBRT which was completed on 01/30/2021.  The patient is currently undergoing concurrent chemoradiation with weekly carboplatin for an AUC of 2 and paclitaxel 45 mg per metered square.  The patient is status post 4 cycles.  The patient's last day radiation is scheduled for 09/18/2021.  Labs were reviewed.  Recommend that she proceed  with cycle #5 today scheduled.  I will arrange for restaging CT scan of the chest to be completed approximately 3 weeks after her last radiation.  We will see her back for follow-up visit a few days after her CT scan to review the results and for more detailed discussion about her current condition and next steps in her plan of care.  I reviewed her schedule with Dr. Julien Nordmann.  The patient's last day radiation is on 09/18/2021.  With that said, Dr. Julien Nordmann recommends that we keep her infusion scheduled on 09/17/2021 and proceed with 6 cycles of chemotherapy as long as her labs are within parameters next week.  The patient was advised to call immediately if she has any concerning symptoms in the interval. The patient voices understanding of current disease status and treatment options and is in agreement with the current care plan. All questions were answered. The patient knows to call the clinic with any problems, questions or concerns. We can certainly see the patient much sooner if necessary      Orders Placed This Encounter  Procedures   CT Chest W Contrast    Standing Status:   Future    Standing Expiration Date:   09/10/2022    Order Specific Question:   If indicated for the ordered procedure, I authorize the administration of contrast media per Radiology protocol    Answer:   Yes    Order Specific Question:   Preferred imaging location?    Answer:   [facility]   CBC with Differential ([facility] Only)    Standing Status:   Future    Standing Expiration Date:   09/11/2022   CMP ([facility] only)    Standing Status:   Future    Standing Expiration Date:   09/11/2022      The total time spent in the appointment was 20-29 minutes.   Khan Chura L Jameis Newsham, PA-C 09/10/21

## 2021-09-06 ENCOUNTER — Other Ambulatory Visit: Payer: Self-pay

## 2021-09-06 ENCOUNTER — Ambulatory Visit
Admission: RE | Admit: 2021-09-06 | Discharge: 2021-09-06 | Disposition: A | Discharge status: Home / Self Care | Payer: Medicare HMO | Source: Ambulatory Visit | Attending: Radiation Oncology | Admitting: Radiation Oncology

## 2021-09-06 DIAGNOSIS — Z87891 Personal history of nicotine dependence: Secondary | ICD-10-CM | POA: Diagnosis not present

## 2021-09-06 DIAGNOSIS — Z510 Encounter for antineoplastic radiation therapy: Secondary | ICD-10-CM | POA: Diagnosis not present

## 2021-09-06 DIAGNOSIS — C3492 Malignant neoplasm of unspecified part of left bronchus or lung: Secondary | ICD-10-CM | POA: Diagnosis not present

## 2021-09-06 DIAGNOSIS — C3412 Malignant neoplasm of upper lobe, left bronchus or lung: Secondary | ICD-10-CM | POA: Diagnosis not present

## 2021-09-06 LAB — RAD ONC ARIA SESSION SUMMARY
Course Elapsed Days: 35
Plan Fractions Treated to Date: 22
Plan Prescribed Dose Per Fraction: 2 Gy
Plan Total Fractions Prescribed: 30
Plan Total Prescribed Dose: 60 Gy
Reference Point Dosage Given to Date: 44 Gy
Reference Point Session Dosage Given: 2 Gy
Session Number: 22

## 2021-09-07 ENCOUNTER — Other Ambulatory Visit: Payer: Self-pay

## 2021-09-07 ENCOUNTER — Ambulatory Visit
Admission: RE | Admit: 2021-09-07 | Discharge: 2021-09-07 | Disposition: A | Discharge status: Home / Self Care | Payer: Medicare HMO | Source: Ambulatory Visit | Attending: Radiation Oncology | Admitting: Radiation Oncology

## 2021-09-07 DIAGNOSIS — C3412 Malignant neoplasm of upper lobe, left bronchus or lung: Secondary | ICD-10-CM | POA: Diagnosis not present

## 2021-09-07 DIAGNOSIS — Z87891 Personal history of nicotine dependence: Secondary | ICD-10-CM | POA: Diagnosis not present

## 2021-09-07 DIAGNOSIS — Z510 Encounter for antineoplastic radiation therapy: Secondary | ICD-10-CM | POA: Diagnosis not present

## 2021-09-07 DIAGNOSIS — C3492 Malignant neoplasm of unspecified part of left bronchus or lung: Secondary | ICD-10-CM | POA: Diagnosis not present

## 2021-09-07 LAB — RAD ONC ARIA SESSION SUMMARY
Course Elapsed Days: 36
Plan Fractions Treated to Date: 23
Plan Prescribed Dose Per Fraction: 2 Gy
Plan Total Fractions Prescribed: 30
Plan Total Prescribed Dose: 60 Gy
Reference Point Dosage Given to Date: 46 Gy
Reference Point Session Dosage Given: 2 Gy
Session Number: 23

## 2021-09-07 MED FILL — Dexamethasone Sodium Phosphate Inj 100 MG/10ML: INTRAMUSCULAR | Qty: 1 | Status: AC

## 2021-09-10 ENCOUNTER — Other Ambulatory Visit: Payer: Medicare HMO

## 2021-09-10 ENCOUNTER — Inpatient Hospital Stay: Payer: Medicare HMO

## 2021-09-10 ENCOUNTER — Other Ambulatory Visit: Payer: Self-pay

## 2021-09-10 ENCOUNTER — Ambulatory Visit
Admission: RE | Admit: 2021-09-10 | Discharge: 2021-09-10 | Disposition: A | Discharge status: Home / Self Care | Payer: Medicare HMO | Source: Ambulatory Visit | Attending: Radiation Oncology | Admitting: Radiation Oncology

## 2021-09-10 ENCOUNTER — Inpatient Hospital Stay (HOSPITAL_BASED_OUTPATIENT_CLINIC_OR_DEPARTMENT_OTHER): Payer: Medicare HMO | Admitting: Physician Assistant

## 2021-09-10 VITALS — BP 106/59 | HR 70 | Temp 98.4°F | Resp 18

## 2021-09-10 VITALS — BP 100/56 | HR 72 | Wt 204.2 lb

## 2021-09-10 DIAGNOSIS — Z510 Encounter for antineoplastic radiation therapy: Secondary | ICD-10-CM | POA: Diagnosis not present

## 2021-09-10 DIAGNOSIS — C3412 Malignant neoplasm of upper lobe, left bronchus or lung: Secondary | ICD-10-CM

## 2021-09-10 DIAGNOSIS — C3492 Malignant neoplasm of unspecified part of left bronchus or lung: Secondary | ICD-10-CM | POA: Diagnosis not present

## 2021-09-10 DIAGNOSIS — Z95828 Presence of other vascular implants and grafts: Secondary | ICD-10-CM

## 2021-09-10 DIAGNOSIS — D649 Anemia, unspecified: Secondary | ICD-10-CM

## 2021-09-10 DIAGNOSIS — Z87891 Personal history of nicotine dependence: Secondary | ICD-10-CM | POA: Diagnosis not present

## 2021-09-10 DIAGNOSIS — Z5111 Encounter for antineoplastic chemotherapy: Secondary | ICD-10-CM

## 2021-09-10 LAB — CBC WITH DIFFERENTIAL (CANCER CENTER ONLY)
Abs Immature Granulocytes: 0.05 10*3/uL (ref 0.00–0.07)
Basophils Absolute: 0 10*3/uL (ref 0.0–0.1)
Basophils Relative: 1 %
Eosinophils Absolute: 0 10*3/uL (ref 0.0–0.5)
Eosinophils Relative: 0 %
HCT: 30.6 % — ABNORMAL LOW (ref 36.0–46.0)
Hemoglobin: 9.1 g/dL — ABNORMAL LOW (ref 12.0–15.0)
Immature Granulocytes: 2 %
Lymphocytes Relative: 20 %
Lymphs Abs: 0.6 10*3/uL — ABNORMAL LOW (ref 0.7–4.0)
MCH: 26.5 pg (ref 26.0–34.0)
MCHC: 29.7 g/dL — ABNORMAL LOW (ref 30.0–36.0)
MCV: 89 fL (ref 80.0–100.0)
Monocytes Absolute: 0.4 10*3/uL (ref 0.1–1.0)
Monocytes Relative: 12 %
Neutro Abs: 2 10*3/uL (ref 1.7–7.7)
Neutrophils Relative %: 65 %
Platelet Count: 171 10*3/uL (ref 150–400)
RBC: 3.44 MIL/uL — ABNORMAL LOW (ref 3.87–5.11)
RDW: 19.5 % — ABNORMAL HIGH (ref 11.5–15.5)
WBC Count: 3 10*3/uL — ABNORMAL LOW (ref 4.0–10.5)
nRBC: 0 % (ref 0.0–0.2)

## 2021-09-10 LAB — RAD ONC ARIA SESSION SUMMARY
Course Elapsed Days: 39
Plan Fractions Treated to Date: 24
Plan Prescribed Dose Per Fraction: 2 Gy
Plan Total Fractions Prescribed: 30
Plan Total Prescribed Dose: 60 Gy
Reference Point Dosage Given to Date: 48 Gy
Reference Point Session Dosage Given: 2 Gy
Session Number: 24

## 2021-09-10 LAB — CMP (CANCER CENTER ONLY)
ALT: 32 U/L (ref 0–44)
AST: 23 U/L (ref 15–41)
Albumin: 3.4 g/dL — ABNORMAL LOW (ref 3.5–5.0)
Alkaline Phosphatase: 66 U/L (ref 38–126)
Anion gap: 4 — ABNORMAL LOW (ref 5–15)
BUN: 9 mg/dL (ref 8–23)
CO2: 38 mmol/L — ABNORMAL HIGH (ref 22–32)
Calcium: 9.1 mg/dL (ref 8.9–10.3)
Chloride: 99 mmol/L (ref 98–111)
Creatinine: 0.76 mg/dL (ref 0.44–1.00)
GFR, Estimated: >60 mL/min (ref >60)
Glucose, Bld: 89 mg/dL (ref 70–99)
Potassium: 4.2 mmol/L (ref 3.5–5.1)
Sodium: 141 mmol/L (ref 135–145)
Total Bilirubin: 0.4 mg/dL (ref 0.3–1.2)
Total Protein: 6.1 g/dL — ABNORMAL LOW (ref 6.5–8.1)

## 2021-09-10 LAB — SAMPLE TO BLOOD BANK

## 2021-09-10 MED ORDER — SODIUM CHLORIDE 0.9 % IV SOLN
197.4000 mg | Freq: Once | INTRAVENOUS | Status: AC
  Administered 2021-09-10: 200 mg via INTRAVENOUS
  Filled 2021-09-10: qty 20

## 2021-09-10 MED ORDER — FAMOTIDINE IN NACL 20-0.9 MG/50ML-% IV SOLN
20.0000 mg | Freq: Once | INTRAVENOUS | Status: AC
  Administered 2021-09-10: 20 mg via INTRAVENOUS
  Filled 2021-09-10: qty 50

## 2021-09-10 MED ORDER — HEPARIN SOD (PORK) LOCK FLUSH 100 UNIT/ML IV SOLN
500.0000 [IU] | Freq: Once | INTRAVENOUS | Status: AC | PRN
  Administered 2021-09-10: 500 [IU]

## 2021-09-10 MED ORDER — SODIUM CHLORIDE 0.9 % IV SOLN
10.0000 mg | Freq: Once | INTRAVENOUS | Status: AC
  Administered 2021-09-10: 10 mg via INTRAVENOUS
  Filled 2021-09-10: qty 10

## 2021-09-10 MED ORDER — SODIUM CHLORIDE 0.9% FLUSH
10.0000 mL | INTRAVENOUS | Status: DC | PRN
  Administered 2021-09-10: 10 mL

## 2021-09-10 MED ORDER — SODIUM CHLORIDE 0.9 % IV SOLN: Freq: Once | INTRAVENOUS | Status: AC

## 2021-09-10 MED ORDER — DIPHENHYDRAMINE HCL 50 MG/ML IJ SOLN
50.0000 mg | Freq: Once | INTRAMUSCULAR | Status: AC
  Administered 2021-09-10: 50 mg via INTRAVENOUS
  Filled 2021-09-10: qty 1

## 2021-09-10 MED ORDER — SODIUM CHLORIDE 0.9% FLUSH
10.0000 mL | Freq: Once | INTRAVENOUS | Status: AC
  Administered 2021-09-10: 10 mL

## 2021-09-10 MED ORDER — SODIUM CHLORIDE 0.9 % IV SOLN
45.0000 mg/m2 | Freq: Once | INTRAVENOUS | Status: AC
  Administered 2021-09-10: 90 mg via INTRAVENOUS
  Filled 2021-09-10: qty 15

## 2021-09-10 MED ORDER — PALONOSETRON HCL INJECTION 0.25 MG/5ML
0.2500 mg | Freq: Once | INTRAVENOUS | Status: AC
  Administered 2021-09-10: 0.25 mg via INTRAVENOUS
  Filled 2021-09-10: qty 5

## 2021-09-10 NOTE — Patient Instructions (Signed)
[facility]COL[facility]  Discharge Instructions: Thank you for choosing [facility] to provide your oncology and hematology care.   If you have a lab appointment with the [facility], please go directly to the [facility] and check in at the registration area.   Wear comfortable clothing and clothing appropriate for easy access to any Portacath or PICC line.   We strive to give you quality time with your provider. You may need to reschedule your appointment if you arrive late (15 or more minutes).  Arriving late affects you and other patients whose appointments are after yours.  Also, if you miss three or more appointments without notifying the office, you may be dismissed from the clinic at the provider's discretion.      For prescription refill requests, have your pharmacy contact our office and allow 72 hours for refills to be completed.    Today you received the following chemotherapy and/or immunotherapy agents: Paclitaxel (Taxol) and Carboplatin.   To help prevent nausea and vomiting after your treatment, we encourage you to take your nausea medication as directed.  BELOW ARE SYMPTOMS THAT SHOULD BE REPORTED IMMEDIATELY: *FEVER GREATER THAN 100.4 F (38 C) OR HIGHER *CHILLS OR SWEATING *NAUSEA AND VOMITING THAT IS NOT CONTROLLED WITH YOUR NAUSEA MEDICATION *UNUSUAL SHORTNESS OF BREATH *UNUSUAL BRUISING OR BLEEDING *URINARY PROBLEMS (pain or burning when urinating, or frequent urination) *BOWEL PROBLEMS (unusual diarrhea, constipation, pain near the anus) TENDERNESS IN MOUTH AND THROAT WITH OR WITHOUT PRESENCE OF ULCERS (sore throat, sores in mouth, or a toothache) UNUSUAL RASH, SWELLING OR PAIN  UNUSUAL VAGINAL DISCHARGE OR ITCHING   Items with * indicate a potential emergency and should be followed up as soon as possible or go to the Emergency Department if any problems should occur.  Please show the CHEMOTHERAPY ALERT CARD or IMMUNOTHERAPY  ALERT CARD at check-in to the Emergency Department and triage nurse.  Should you have questions after your visit or need to cancel or reschedule your appointment, please contact [facility]  Dept: [phone]  and follow the prompts.  Office hours are 8:00 a.m. to 4:30 p.m. Monday - Friday. Please note that voicemails left after 4:00 p.m. may not be returned until the following business day.  We are closed weekends and major holidays. You have access to a nurse at all times for urgent questions. Please call the main number to the clinic Dept: [phone] and follow the prompts.   For any non-urgent questions, you may also contact your provider using MyChart. We now offer e-Visits for anyone 68 and older to request care online for non-urgent symptoms. For details visit [email or web address].   Also download the MyChart app! Go to the app store, search "MyChart", open the app, select [facility], and log in with your MyChart username and password.  Masks are optional in the cancer centers. If you would like for your care team to wear a mask while they are taking care of you, please let them know. For doctor visits, patients may have with them one support person who is at least 75 years old. At this time, visitors are not allowed in the infusion area.

## 2021-09-11 ENCOUNTER — Ambulatory Visit
Admission: RE | Admit: 2021-09-11 | Discharge: 2021-09-11 | Disposition: A | Discharge status: Home / Self Care | Payer: Medicare HMO | Source: Ambulatory Visit | Attending: Radiation Oncology | Admitting: Radiation Oncology

## 2021-09-11 ENCOUNTER — Other Ambulatory Visit: Payer: Self-pay

## 2021-09-11 DIAGNOSIS — Z87891 Personal history of nicotine dependence: Secondary | ICD-10-CM | POA: Diagnosis not present

## 2021-09-11 DIAGNOSIS — Z510 Encounter for antineoplastic radiation therapy: Secondary | ICD-10-CM | POA: Diagnosis not present

## 2021-09-11 DIAGNOSIS — C3492 Malignant neoplasm of unspecified part of left bronchus or lung: Secondary | ICD-10-CM | POA: Diagnosis not present

## 2021-09-11 DIAGNOSIS — C3412 Malignant neoplasm of upper lobe, left bronchus or lung: Secondary | ICD-10-CM | POA: Diagnosis not present

## 2021-09-11 LAB — RAD ONC ARIA SESSION SUMMARY
Course Elapsed Days: 40
Plan Fractions Treated to Date: 25
Plan Prescribed Dose Per Fraction: 2 Gy
Plan Total Fractions Prescribed: 30
Plan Total Prescribed Dose: 60 Gy
Reference Point Dosage Given to Date: 50 Gy
Reference Point Session Dosage Given: 2 Gy
Session Number: 25

## 2021-09-12 ENCOUNTER — Other Ambulatory Visit: Payer: Self-pay

## 2021-09-12 ENCOUNTER — Ambulatory Visit
Admission: RE | Admit: 2021-09-12 | Discharge: 2021-09-12 | Disposition: A | Discharge status: Home / Self Care | Payer: Medicare HMO | Source: Ambulatory Visit | Attending: Radiation Oncology | Admitting: Radiation Oncology

## 2021-09-12 DIAGNOSIS — C3492 Malignant neoplasm of unspecified part of left bronchus or lung: Secondary | ICD-10-CM | POA: Diagnosis not present

## 2021-09-12 DIAGNOSIS — Z510 Encounter for antineoplastic radiation therapy: Secondary | ICD-10-CM | POA: Diagnosis not present

## 2021-09-12 DIAGNOSIS — C3412 Malignant neoplasm of upper lobe, left bronchus or lung: Secondary | ICD-10-CM | POA: Diagnosis not present

## 2021-09-12 DIAGNOSIS — Z87891 Personal history of nicotine dependence: Secondary | ICD-10-CM | POA: Diagnosis not present

## 2021-09-12 LAB — RAD ONC ARIA SESSION SUMMARY
Course Elapsed Days: 41
Plan Fractions Treated to Date: 26
Plan Prescribed Dose Per Fraction: 2 Gy
Plan Total Fractions Prescribed: 30
Plan Total Prescribed Dose: 60 Gy
Reference Point Dosage Given to Date: 52 Gy
Reference Point Session Dosage Given: 2 Gy
Session Number: 26

## 2021-09-13 ENCOUNTER — Ambulatory Visit
Admission: RE | Admit: 2021-09-13 | Discharge: 2021-09-13 | Disposition: A | Discharge status: Home / Self Care | Payer: Medicare HMO | Source: Ambulatory Visit | Attending: Radiation Oncology | Admitting: Radiation Oncology

## 2021-09-13 ENCOUNTER — Ambulatory Visit: Payer: Medicare HMO

## 2021-09-13 ENCOUNTER — Other Ambulatory Visit: Payer: Self-pay

## 2021-09-13 DIAGNOSIS — C3492 Malignant neoplasm of unspecified part of left bronchus or lung: Secondary | ICD-10-CM | POA: Diagnosis not present

## 2021-09-13 DIAGNOSIS — Z87891 Personal history of nicotine dependence: Secondary | ICD-10-CM | POA: Diagnosis not present

## 2021-09-13 DIAGNOSIS — C3412 Malignant neoplasm of upper lobe, left bronchus or lung: Secondary | ICD-10-CM | POA: Diagnosis not present

## 2021-09-13 DIAGNOSIS — Z510 Encounter for antineoplastic radiation therapy: Secondary | ICD-10-CM | POA: Diagnosis not present

## 2021-09-13 LAB — RAD ONC ARIA SESSION SUMMARY
Course Elapsed Days: 42
Plan Fractions Treated to Date: 27
Plan Prescribed Dose Per Fraction: 2 Gy
Plan Total Fractions Prescribed: 30
Plan Total Prescribed Dose: 60 Gy
Reference Point Dosage Given to Date: 54 Gy
Reference Point Session Dosage Given: 2 Gy
Session Number: 27

## 2021-09-14 ENCOUNTER — Ambulatory Visit: Payer: Medicare HMO

## 2021-09-14 ENCOUNTER — Ambulatory Visit
Admission: RE | Admit: 2021-09-14 | Discharge: 2021-09-14 | Disposition: A | Discharge status: Home / Self Care | Payer: Medicare HMO | Source: Ambulatory Visit | Attending: Radiation Oncology | Admitting: Radiation Oncology

## 2021-09-14 ENCOUNTER — Other Ambulatory Visit: Payer: Self-pay

## 2021-09-14 DIAGNOSIS — C3412 Malignant neoplasm of upper lobe, left bronchus or lung: Secondary | ICD-10-CM | POA: Diagnosis not present

## 2021-09-14 DIAGNOSIS — Z510 Encounter for antineoplastic radiation therapy: Secondary | ICD-10-CM | POA: Diagnosis not present

## 2021-09-14 DIAGNOSIS — Z87891 Personal history of nicotine dependence: Secondary | ICD-10-CM | POA: Diagnosis not present

## 2021-09-14 DIAGNOSIS — C3492 Malignant neoplasm of unspecified part of left bronchus or lung: Secondary | ICD-10-CM | POA: Diagnosis not present

## 2021-09-14 LAB — RAD ONC ARIA SESSION SUMMARY
Course Elapsed Days: 43
Plan Fractions Treated to Date: 28
Plan Prescribed Dose Per Fraction: 2 Gy
Plan Total Fractions Prescribed: 30
Plan Total Prescribed Dose: 60 Gy
Reference Point Dosage Given to Date: 56 Gy
Reference Point Session Dosage Given: 2 Gy
Session Number: 28

## 2021-09-14 MED FILL — Dexamethasone Sodium Phosphate Inj 100 MG/10ML: INTRAMUSCULAR | Qty: 1 | Status: AC

## 2021-09-15 ENCOUNTER — Other Ambulatory Visit: Payer: Self-pay

## 2021-09-17 ENCOUNTER — Ambulatory Visit: Payer: Medicare HMO

## 2021-09-17 ENCOUNTER — Other Ambulatory Visit: Payer: Medicare HMO

## 2021-09-17 ENCOUNTER — Ambulatory Visit
Admission: RE | Admit: 2021-09-17 | Discharge: 2021-09-17 | Disposition: A | Discharge status: Home / Self Care | Payer: Medicare HMO | Source: Ambulatory Visit | Attending: Radiation Oncology | Admitting: Radiation Oncology

## 2021-09-17 ENCOUNTER — Inpatient Hospital Stay: Payer: Medicare HMO

## 2021-09-17 ENCOUNTER — Other Ambulatory Visit: Payer: Self-pay

## 2021-09-17 VITALS — BP 110/69 | HR 78 | Temp 98.2°F | Resp 18

## 2021-09-17 DIAGNOSIS — D649 Anemia, unspecified: Secondary | ICD-10-CM

## 2021-09-17 DIAGNOSIS — C3492 Malignant neoplasm of unspecified part of left bronchus or lung: Secondary | ICD-10-CM

## 2021-09-17 DIAGNOSIS — C3412 Malignant neoplasm of upper lobe, left bronchus or lung: Secondary | ICD-10-CM | POA: Diagnosis not present

## 2021-09-17 DIAGNOSIS — Z87891 Personal history of nicotine dependence: Secondary | ICD-10-CM | POA: Diagnosis not present

## 2021-09-17 DIAGNOSIS — Z95828 Presence of other vascular implants and grafts: Secondary | ICD-10-CM

## 2021-09-17 DIAGNOSIS — Z510 Encounter for antineoplastic radiation therapy: Secondary | ICD-10-CM | POA: Diagnosis not present

## 2021-09-17 DIAGNOSIS — Z5111 Encounter for antineoplastic chemotherapy: Secondary | ICD-10-CM | POA: Diagnosis not present

## 2021-09-17 LAB — CMP (CANCER CENTER ONLY)
ALT: 35 U/L (ref 0–44)
AST: 22 U/L (ref 15–41)
Albumin: 3.5 g/dL (ref 3.5–5.0)
Alkaline Phosphatase: 62 U/L (ref 38–126)
Anion gap: 3 — ABNORMAL LOW (ref 5–15)
BUN: 11 mg/dL (ref 8–23)
CO2: 38 mmol/L — ABNORMAL HIGH (ref 22–32)
Calcium: 8.9 mg/dL (ref 8.9–10.3)
Chloride: 100 mmol/L (ref 98–111)
Creatinine: 0.84 mg/dL (ref 0.44–1.00)
GFR, Estimated: >60 mL/min (ref >60)
Glucose, Bld: 113 mg/dL — ABNORMAL HIGH (ref 70–99)
Potassium: 3.7 mmol/L (ref 3.5–5.1)
Sodium: 141 mmol/L (ref 135–145)
Total Bilirubin: 0.3 mg/dL (ref 0.3–1.2)
Total Protein: 6.3 g/dL — ABNORMAL LOW (ref 6.5–8.1)

## 2021-09-17 LAB — CBC WITH DIFFERENTIAL (CANCER CENTER ONLY)
Abs Immature Granulocytes: 0.06 10*3/uL (ref 0.00–0.07)
Basophils Absolute: 0 10*3/uL (ref 0.0–0.1)
Basophils Relative: 1 %
Eosinophils Absolute: 0 10*3/uL (ref 0.0–0.5)
Eosinophils Relative: 1 %
HCT: 29.3 % — ABNORMAL LOW (ref 36.0–46.0)
Hemoglobin: 9.1 g/dL — ABNORMAL LOW (ref 12.0–15.0)
Immature Granulocytes: 2 %
Lymphocytes Relative: 17 %
Lymphs Abs: 0.5 10*3/uL — ABNORMAL LOW (ref 0.7–4.0)
MCH: 27.2 pg (ref 26.0–34.0)
MCHC: 31.1 g/dL (ref 30.0–36.0)
MCV: 87.7 fL (ref 80.0–100.0)
Monocytes Absolute: 0.4 10*3/uL (ref 0.1–1.0)
Monocytes Relative: 12 %
Neutro Abs: 2.1 10*3/uL (ref 1.7–7.7)
Neutrophils Relative %: 67 %
Platelet Count: 160 10*3/uL (ref 150–400)
RBC: 3.34 MIL/uL — ABNORMAL LOW (ref 3.87–5.11)
RDW: 20.4 % — ABNORMAL HIGH (ref 11.5–15.5)
WBC Count: 3.1 10*3/uL — ABNORMAL LOW (ref 4.0–10.5)
nRBC: 0 % (ref 0.0–0.2)

## 2021-09-17 LAB — RAD ONC ARIA SESSION SUMMARY
Course Elapsed Days: 46
Plan Fractions Treated to Date: 29
Plan Prescribed Dose Per Fraction: 2 Gy
Plan Total Fractions Prescribed: 30
Plan Total Prescribed Dose: 60 Gy
Reference Point Dosage Given to Date: 58 Gy
Reference Point Session Dosage Given: 2 Gy
Session Number: 29

## 2021-09-17 LAB — SAMPLE TO BLOOD BANK

## 2021-09-17 MED ORDER — SODIUM CHLORIDE 0.9 % IV SOLN
45.0000 mg/m2 | Freq: Once | INTRAVENOUS | Status: AC
  Administered 2021-09-17: 90 mg via INTRAVENOUS
  Filled 2021-09-17: qty 15

## 2021-09-17 MED ORDER — FAMOTIDINE IN NACL 20-0.9 MG/50ML-% IV SOLN
20.0000 mg | Freq: Once | INTRAVENOUS | Status: AC
  Administered 2021-09-17: 20 mg via INTRAVENOUS
  Filled 2021-09-17: qty 50

## 2021-09-17 MED ORDER — SODIUM CHLORIDE 0.9% FLUSH
10.0000 mL | Freq: Once | INTRAVENOUS | Status: AC
  Administered 2021-09-17: 10 mL

## 2021-09-17 MED ORDER — SODIUM CHLORIDE 0.9 % IV SOLN: Freq: Once | INTRAVENOUS | Status: AC

## 2021-09-17 MED ORDER — PALONOSETRON HCL INJECTION 0.25 MG/5ML
0.2500 mg | Freq: Once | INTRAVENOUS | Status: AC
  Administered 2021-09-17: 0.25 mg via INTRAVENOUS
  Filled 2021-09-17: qty 5

## 2021-09-17 MED ORDER — SODIUM CHLORIDE 0.9 % IV SOLN
10.0000 mg | Freq: Once | INTRAVENOUS | Status: AC
  Administered 2021-09-17: 10 mg via INTRAVENOUS
  Filled 2021-09-17: qty 10

## 2021-09-17 MED ORDER — HEPARIN SOD (PORK) LOCK FLUSH 100 UNIT/ML IV SOLN
500.0000 [IU] | Freq: Once | INTRAVENOUS | Status: AC | PRN
  Administered 2021-09-17: 500 [IU]

## 2021-09-17 MED ORDER — SODIUM CHLORIDE 0.9% FLUSH
10.0000 mL | INTRAVENOUS | Status: DC | PRN
  Administered 2021-09-17: 10 mL

## 2021-09-17 MED ORDER — SODIUM CHLORIDE 0.9 % IV SOLN
197.4000 mg | Freq: Once | INTRAVENOUS | Status: AC
  Administered 2021-09-17: 200 mg via INTRAVENOUS
  Filled 2021-09-17: qty 20

## 2021-09-17 MED ORDER — DIPHENHYDRAMINE HCL 50 MG/ML IJ SOLN
50.0000 mg | Freq: Once | INTRAMUSCULAR | Status: AC
  Administered 2021-09-17: 50 mg via INTRAVENOUS
  Filled 2021-09-17: qty 1

## 2021-09-17 NOTE — Patient Instructions (Signed)
[facility] ONCOL[facility]  Discharge Instructions: Thank you for choosing [facility] to provide your oncology and hematology care.   If you have a lab appointment with the [facility], please go directly to the [facility] and check in at the registration area.   Wear comfortable clothing and clothing appropriate for easy access to any Portacath or PICC line.   We strive to give you quality time with your provider. You may need to reschedule your appointment if you arrive late (15 or more minutes).  Arriving late affects you and other patients whose appointments are after yours.  Also, if you miss three or more appointments without notifying the office, you may be dismissed from the clinic at the provider's discretion.      For prescription refill requests, have your pharmacy contact our office and allow 72 hours for refills to be completed.    Today you received the following chemotherapy and/or immunotherapy agents: Paclitaxel, Carboplatin.       To help prevent nausea and vomiting after your treatment, we encourage you to take your nausea medication as directed.  BELOW ARE SYMPTOMS THAT SHOULD BE REPORTED IMMEDIATELY: *FEVER GREATER THAN 100.4 F (38 C) OR HIGHER *CHILLS OR SWEATING *NAUSEA AND VOMITING THAT IS NOT CONTROLLED WITH YOUR NAUSEA MEDICATION *UNUSUAL SHORTNESS OF BREATH *UNUSUAL BRUISING OR BLEEDING *URINARY PROBLEMS (pain or burning when urinating, or frequent urination) *BOWEL PROBLEMS (unusual diarrhea, constipation, pain near the anus) TENDERNESS IN MOUTH AND THROAT WITH OR WITHOUT PRESENCE OF ULCERS (sore throat, sores in mouth, or a toothache) UNUSUAL RASH, SWELLING OR PAIN  UNUSUAL VAGINAL DISCHARGE OR ITCHING   Items with * indicate a potential emergency and should be followed up as soon as possible or go to the Emergency Department if any problems should occur.  Please show the CHEMOTHERAPY ALERT CARD or IMMUNOTHERAPY ALERT CARD  at check-in to the Emergency Department and triage nurse.  Should you have questions after your visit or need to cancel or reschedule your appointment, please contact [facility]  Dept: [phone]  and follow the prompts.  Office hours are 8:00 a.m. to 4:30 p.m. Monday - Friday. Please note that voicemails left after 4:00 p.m. may not be returned until the following business day.  We are closed weekends and major holidays. You have access to a nurse at all times for urgent questions. Please call the main number to the clinic Dept: [phone] and follow the prompts.   For any non-urgent questions, you may also contact your provider using MyChart. We now offer e-Visits for anyone 76 and older to request care online for non-urgent symptoms. For details visit [email or web address].   Also download the MyChart app! Go to the app store, search "MyChart", open the app, select [facility], and log in with your MyChart username and password.  Masks are optional in the cancer centers. If you would like for your care team to wear a mask while they are taking care of you, please let them know. For doctor visits, patients may have with them one support person who is at least 75 years old. At this time, visitors are not allowed in the infusion area.

## 2021-09-18 ENCOUNTER — Ambulatory Visit
Admission: RE | Admit: 2021-09-18 | Discharge: 2021-09-18 | Disposition: A | Discharge status: Home / Self Care | Payer: Medicare HMO | Source: Ambulatory Visit | Attending: Radiation Oncology | Admitting: Radiation Oncology

## 2021-09-18 ENCOUNTER — Ambulatory Visit: Payer: Medicare HMO | Admitting: Nurse Practitioner

## 2021-09-18 ENCOUNTER — Other Ambulatory Visit: Payer: Self-pay

## 2021-09-18 ENCOUNTER — Encounter: Payer: Self-pay | Admitting: Radiation Oncology

## 2021-09-18 DIAGNOSIS — C3492 Malignant neoplasm of unspecified part of left bronchus or lung: Secondary | ICD-10-CM | POA: Insufficient documentation

## 2021-09-18 DIAGNOSIS — Z87891 Personal history of nicotine dependence: Secondary | ICD-10-CM | POA: Diagnosis not present

## 2021-09-18 DIAGNOSIS — Z510 Encounter for antineoplastic radiation therapy: Secondary | ICD-10-CM | POA: Diagnosis not present

## 2021-09-18 DIAGNOSIS — C3412 Malignant neoplasm of upper lobe, left bronchus or lung: Secondary | ICD-10-CM | POA: Diagnosis not present

## 2021-09-18 LAB — RAD ONC ARIA SESSION SUMMARY
Course Elapsed Days: 47
Plan Fractions Treated to Date: 30
Plan Prescribed Dose Per Fraction: 2 Gy
Plan Total Fractions Prescribed: 30
Plan Total Prescribed Dose: 60 Gy
Reference Point Dosage Given to Date: 60 Gy
Reference Point Session Dosage Given: 2 Gy
Session Number: 30

## 2021-09-25 DIAGNOSIS — J9601 Acute respiratory failure with hypoxia: Secondary | ICD-10-CM | POA: Diagnosis not present

## 2021-09-25 NOTE — Progress Notes (Unsigned)
Cardiology Clinic Note   Patient Name: [name] Date of Encounter: 09/26/2021  Primary Care Provider:  [name], NP Primary Cardiologist:  Shelva Majestic, MD  Patient Profile    [name] 76 year old female presents to the clinic today for follow-up evaluation of her paroxysmal atrial fibrillation.  Past Medical History    Past Medical History:  Diagnosis Date   CAD (coronary artery disease)    Cancer ([place])    COPD (chronic obstructive pulmonary disease) ([place])    per DM note, pt denies   Dyslipidemia    GERD (gastroesophageal reflux disease)    History of radiation therapy    Left lung- 01/16/21-01/30/21- Dr. Gery Pray   HTN (hypertension)    Hypothyroidism    Myocardial infarct [facility])    Non-small cell lung cancer ([place]) 08/19/2010   Stage IA, status post left upper lobectomy July 2012   Tobacco abuse    Past Surgical History:  Procedure Laterality Date   CARDIAC CATHETERIZATION  07/06/2010   see CABG report - pt sent to OR   CARDIOVASCULAR STRESS TEST  09/11/2010   R/S MV - normal perfusion in all regions, EF 46%, no scintigraphic evidence of inducible myocardial ischemia; global LV systolic function mildly reduced; no significant wall motion abnormalities noted; Exercise capacity 7 METS; EKG negative for ischemia; low risk study, no signifcant change from previous study 08/2003   CORONARY ARTERY BYPASS GRAFT  07/11/2010   LIMA to LAD; SVG to 2nd branch OM; SVG to posterior descending artery   IR IMAGING GUIDED PORT INSERTION  08/16/2021   LEFT VATS  09/17/2010   TEE WITHOUT CARDIOVERSION  07/06/2010   during emergent CABG surgery; 2-3+ mitral regurgitation   VIDEO BRONCHOSCOPY N/A 07/27/2021   Procedure: VIDEO BRONCHOSCOPY;  Surgeon: Melrose Nakayama, MD;  Location: [place] OR;  Service: Thoracic;  Laterality: N/A;   VIDEO BRONCHOSCOPY WITH ENDOBRONCHIAL ULTRASOUND N/A 07/27/2021   Procedure: VIDEO BRONCHOSCOPY WITH ENDOBRONCHIAL ULTRASOUND;   Surgeon: Melrose Nakayama, MD;  Location: [facility] OR;  Service: Thoracic;  Laterality: N/A;    Allergies  Allergies  Allergen Reactions   Codeine Other (See Comments)    Unknown reaction Patient avoids this medication    History of Present Illness    [name] has a PMH of MI with V-fib arrest in 1996 status post PTCA of her LAD.  She underwent CABG in 2012, with prior ICM EF 35% in 2012 prior to CABG.  EF improved to 50-55% 9/22.  Her PMH also includes paroxysmal atrial fibrillation, HTN, HLD, tobacco abuse, COPD stage IV with chronic respiratory failure on home oxygen, lung cancer, anemia, and obesity.  She was seen in the emergency department on 08/07/2021 with SVT.  Her heart rate was in the 170s and resolved with metoprolol.  CXR showed opacification of left hemithorax.  Pulmonology felt it represented a complete endobronchial obstruction due to tumor with some pleural effusion.  They plan for continued outpatient management was made.  She returned to the emergency department later in the evening with palpitations.  It was initially felt that she had SVT which was not amenable to adenosine.  She was seen by cardiology who felt it was more likely atrial flutter.  Her symptoms were accompanied by fever, chills, and leukocytosis.  She was treated with IV amiodarone and converted to normal sinus rhythm.  She was admitted to the hospital on 08/09/2021 and discharged on 08/11/2021.  She was diagnosed with lung cancer  in the setting of UTI.  For her paroxysmal atrial flutter she was placed on amiodarone with a plan to take 400 mg twice daily x7 days, 200 mg twice daily for 7 days and then 200 mg thereafter.  Her metoprolol 100 mg daily was continued.  She is not anticoagulated due to concern for amyloid angiopathy which was seen on her MRI head with evidence of chronic peripheral microhemorrhage.  She was noted to have elevated troponins which were felt to be related to demand ischemia.  She  presents to the clinic today for follow-up evaluation and states is doing well with her supplemental oxygen.  She is using 3 L nasal cannula and satting in the low 90s.  She has noted some dizziness.  She reports poor hydration.  We reviewed the importance of maintaining good p.o. hydration and changing positions slowly.  We reviewed her recent hospitalization.  We also reviewed her recent labs and screening for amiodarone.  I will continue amiodarone, give the  support stocking she, have her maintain her physical activity, and plan follow-up in 3 to 4 months.  She is currently taking chemo and radiation for lung cancer.  I will postpone her echocardiogram at this time.  Today she denies chest pain, creased shortness of breath, lower extremity edema, fatigue, palpitations, melena, hematuria, hemoptysis, diaphoresis, orthopnea, and PND.  Home Medications    Prior to Admission medications   Medication Sig Start Date End Date Taking? Authorizing Provider  acetaminophen (TYLENOL) 500 MG tablet Take 500 mg by mouth every 6 (six) hours as needed for moderate pain or fever.    [provider]  albuterol (PROAIR HFA) 108 (90 Base) MCG/ACT inhaler 2 puffs up to every 4 hours as needed only  if your can't catch your breath Patient taking differently: Inhale 2 puffs into the lungs every 4 (four) hours as needed for shortness of breath. 03/27/21   Tanda Rockers, MD  albuterol (PROVENTIL) (2.5 MG/3ML) 0.083% nebulizer solution Take 2.5 mg by nebulization 3 (three) times daily as needed for wheezing or shortness of breath.    [provider]  amiodarone (PACERONE) 200 MG tablet Advised to take amiodarone 400 mg twice daily for 7 days followed by amiodarone 200 mg twice daily for 7 days then followed by amiodarone 200 mg daily until follow-up with cardiology. 08/25/21   [name], NP  aspirin 81 MG tablet Take 81 mg by mouth at bedtime.    [provider]   chlorpheniramine-HYDROcodone 10-8 MG/5ML Take 5 mLs by mouth at bedtime as needed for cough. 08/02/21   [provider]  Coenzyme Q10 (CO Q 10 PO) Take 1 capsule by mouth daily.    [provider]  fluconazole (DIFLUCAN) 100 MG tablet Take 1 tablet (100 mg total) by mouth daily. Patient not taking: Reported on 09/03/2021 08/06/21   Heilingoetter, Cassandra L, PA-C  furosemide (LASIX) 20 MG tablet Take 1 tablet po BID for 5 days then go back to taking 1 tablet po QD 08/17/21   [name], NP  levothyroxine (SYNTHROID) 25 MCG tablet TAKE 0.5 TABLETS (12.5 MCG TOTAL) BY MOUTH DAILY. IN ADDITION TO THE 50 MCG. Patient taking differently: Take 12.5 mcg by mouth See admin instructions. 12.5 mcg daily except on sunday 11/17/19   Troy Sine, MD  levothyroxine (SYNTHROID) 50 MCG tablet TAKE 1 TABLET BY MOUTH EVERY DAY BEFORE BREAKFAST Patient taking differently: Take 50 mcg by mouth See admin instructions. Along with 12.5 mcg  qd except on Sunday (pt just takes 50 mcg) 04/09/21   Troy Sine, MD  lidocaine-prilocaine (EMLA) cream Apply 1 Application topically as needed. Patient taking differently: Apply 1 Application topically as needed (prior to port access). 08/06/21   Heilingoetter, Cassandra L, PA-C  metoprolol succinate (TOPROL-XL) 100 MG 24 hr tablet Take 1 tablet (100 mg total) by mouth daily. Take with or immediately following a meal. Patient taking differently: Take 100 mg by mouth daily. 12/20/20   [name], NP  Omega-3 Fatty Acids (OMEGA-3 PO) Take 1,000 mg by mouth in the morning and at bedtime.    [provider]  OXYGEN Inhale 3.5 L into the lungs continuous.    [provider]  potassium chloride SA (KLOR-CON M) 10 MEQ tablet Take 1 tablet (10 mEq total) by mouth daily. 08/17/21   [name], NP  prochlorperazine (COMPAZINE) 10 MG tablet Take 1 tablet (10 mg total) by mouth every 6 (six) hours as needed. 08/06/21   Heilingoetter,  Cassandra L, PA-C  rosuvastatin (CRESTOR) 40 MG tablet Take 1 tablet (40 mg total) by mouth daily. Patient taking differently: Take 40 mg by mouth every evening. 11/29/20   Troy Sine, MD  Tiotropium Bromide-Olodaterol (STIOLTO RESPIMAT) 2.5-2.5 MCG/ACT AERS Inhale 2 puffs into the lungs daily. 08/22/21   Cobb, Karie Schwalbe, NP  Tiotropium Bromide-Olodaterol (STIOLTO RESPIMAT) 2.5-2.5 MCG/ACT AERS Inhale 2 puffs into the lungs daily. 08/22/21   Clayton Bibles, NP    Family History    Family History  Problem Relation Age of Onset   Hypothyroidism Father    Heart attack Father    Aplastic anemia Maternal Grandfather    Stroke Paternal Grandfather    She indicated that her mother is alive. She indicated that her father is deceased. She indicated that her sister is deceased. She indicated that her brother is alive. She indicated that the status of her maternal grandfather is unknown. She indicated that the status of her paternal grandfather is unknown.  Social History    Social History   Socioeconomic History   Marital status: Married    Spouse name: Not on file   Number of children: Not on file   Years of education: Not on file   Highest education level: Not on file  Occupational History   Not on file  Tobacco Use   Smoking status: Former    Packs/day: 1.00    Years: 58.00    Total pack years: 58.00    Types: Cigarettes    Quit date: 03/21/2020    Years since quitting: 1.5   Smokeless tobacco: Former   Tobacco comments:    Pt states she is currently smoking  Vaping Use   Vaping Use: Never used  Substance and Sexual Activity   Alcohol use: No    Alcohol/week: 0.0 standard drinks of alcohol   Drug use: No   Sexual activity: Never  Other Topics Concern   Not on file  Social History Narrative   Not on file   Social Determinants of Health   Financial Resource Strain: Not on file  Food Insecurity: Not on file  Transportation Needs: Not on file  Physical Activity: Not  on file  Stress: Not on file  Social Connections: Not on file  Intimate Partner Violence: Not on file     Review of Systems    General:  No chills, fever, night sweats or weight changes.  Cardiovascular:  No chest pain, dyspnea on exertion,  edema, orthopnea, palpitations, paroxysmal nocturnal dyspnea. Dermatological: No rash, lesions/masses Respiratory: No cough, dyspnea Urologic: No hematuria, dysuria Abdominal:   No nausea, vomiting, diarrhea, bright red blood per rectum, melena, or hematemesis Neurologic:  No visual changes, wkns, changes in mental status. All other systems reviewed and are otherwise negative except as noted above.  Physical Exam    VS:  BP 106/64   Pulse 91   Ht 5\' 3"  (1.6 m)   Wt 206 lb (93.4 kg)   SpO2 92%   BMI 36.49 kg/m  , BMI Body mass index is 36.49 kg/m. GEN: Well nourished, well developed, in no acute distress. HEENT: normal. Neck: Supple, no JVD, carotid bruits, or masses. Cardiac: RRR, no murmurs, rubs, or gallops. No clubbing, cyanosis, edema.  Radials/DP/PT 2+ and equal bilaterally.  Respiratory:  Respirations regular and unlabored, clear to auscultation bilaterally. GI: Soft, nontender, nondistended, BS + x 4. MS: no deformity or atrophy. Skin: warm and dry, no rash. Neuro:  Strength and sensation are intact. Psych: Normal affect.  Accessory Clinical Findings    Recent Labs: 08/08/2021: B Natriuretic Peptide 360.1; TSH 2.100 08/11/2021: Magnesium 2.2 09/17/2021: ALT 35; BUN 11; Creatinine 0.84; Hemoglobin 9.1; Platelet Count 160; Potassium 3.7; Sodium 141   Recent Lipid Panel    Component Value Date/Time   CHOL 117 05/02/2021 1055   CHOL 114 07/22/2012 0840   TRIG 96 05/02/2021 1055   TRIG 170 (H) 07/22/2012 0840   HDL 46 05/02/2021 1055   HDL 42 07/22/2012 0840   CHOLHDL 2.5 05/02/2021 1055   CHOLHDL 3.2 06/26/2016 0859   VLDL 25 06/26/2016 0859   LDLCALC 53 05/02/2021 1055   LDLCALC 38 07/22/2012 0840    ECG personally  reviewed by me today-none today.  Echocardiogram 11/14/2020  IMPRESSIONS     1. Left ventricular ejection fraction, by estimation, is 50 to 55%. Left  ventricular ejection fraction by 3D volume is 54 %. The left ventricle has  low normal function. The left ventricle has no regional wall motion  abnormalities. Left ventricular  diastolic parameters are consistent with Grade I diastolic dysfunction  (impaired relaxation). The average left ventricular global longitudinal  strain is -16.7 %. The global longitudinal strain is mildly decreased.   2. Right ventricular systolic function is normal. The right ventricular  size is normal. Tricuspid regurgitation signal is inadequate for assessing  PA pressure.   3. The mitral valve is normal in structure. Mild mitral valve  regurgitation. No evidence of mitral stenosis.   4. The aortic valve is tricuspid. Aortic valve regurgitation is not  visualized. No aortic stenosis is present.   5. The inferior vena cava is normal in size with greater than 50%  respiratory variability, suggesting right atrial pressure of 3 mmHg.   Comparison(s): A prior study was performed on 07/27/2019. Similar LVEF with  slight decrease in LV strain.  Assessment & Plan   1.  Paroxysmal atrial flutter-heart rate today 91 bpm.  Denies recent episodes of irregular or accelerated heartbeat.  Not a candidate for ACE due to concern for myeloid angiopathic being on MRI head without evidence of chronic peripheral microhemorrhage. Continue amiodarone, metoprolol Heart healthy low-sodium diet-salty 6 given Increase physical activity as tolerated Avoid triggers caffeine, chocolate, EtOH, dehydration etc. Refill amiodarone  Coronary artery disease-no chest pain today.  He was noted to have elevated troponins during recent hospital admission.  She underwent previous PCI in 1996 or 1997 and received angioplasty.  It was felt that her current  process was related to demand  ischemia. Continue metoprolol, rosuvastatin, aspirin Heart healthy low-sodium diet-salty 6 given Increase physical activity as tolerated  Ischemic cardiomyopathy-no increased DOE or activity intolerance.  Previously EF noted to be 35% prior to CABG.  Post CABG her EF improved to 50-55% 2022.  Weight stable.  Euvolemic today. Continue metoprolol, furosemide Heart healthy low-sodium diet-salty 6 given Increase physical activity as tolerated  Essential hypertension-BP today 106/64.  Has noticed some dizziness. Increase p.o. hydration, change positions slowly Continue metoprolol Increase physical activity as tolerated  Non-small cell lung cancer-following with pulmonology and oncology. Continue current medical therapy  Disposition: Follow-up with Dr. Claiborne Billings in 3-4 months.   Jossie Ng. Krystle Polcyn NP-C     09/26/2021, 1:58 PM [facility] [place] [address] Office [phone] Fax [phone]  Notice: This dictation was prepared with Dragon dictation along with smaller phrase technology. Any transcriptional errors that result from this process are unintentional and may not be corrected upon review.  I spent 14 minutes examining this patient, reviewing medications, and using patient centered shared decision making involving her cardiac care.  Prior to her visit I spent greater than 20 minutes reviewing her past medical history,  medications, and prior cardiac tests.

## 2021-09-26 ENCOUNTER — Encounter: Payer: Self-pay | Admitting: General Practice

## 2021-09-26 ENCOUNTER — Ambulatory Visit (INDEPENDENT_AMBULATORY_CARE_PROVIDER_SITE_OTHER): Payer: Medicare HMO | Admitting: General Practice

## 2021-09-26 VITALS — BP 106/64 | HR 91 | Ht 63.0 in | Wt 206.0 lb

## 2021-09-26 DIAGNOSIS — I10 Essential (primary) hypertension: Secondary | ICD-10-CM | POA: Diagnosis not present

## 2021-09-26 DIAGNOSIS — I480 Paroxysmal atrial fibrillation: Secondary | ICD-10-CM

## 2021-09-26 DIAGNOSIS — Z85118 Personal history of other malignant neoplasm of bronchus and lung: Secondary | ICD-10-CM | POA: Diagnosis not present

## 2021-09-26 DIAGNOSIS — I255 Ischemic cardiomyopathy: Secondary | ICD-10-CM | POA: Diagnosis not present

## 2021-09-26 DIAGNOSIS — I2510 Atherosclerotic heart disease of native coronary artery without angina pectoris: Secondary | ICD-10-CM

## 2021-09-26 DIAGNOSIS — I2583 Coronary atherosclerosis due to lipid rich plaque: Secondary | ICD-10-CM

## 2021-09-26 MED ORDER — AMIODARONE HCL 100 MG PO TABS: 100.0000 mg | ORAL_TABLET | Freq: Every day | ORAL | 6 refills | Status: DC

## 2021-09-26 NOTE — Progress Notes (Unsigned)
Established patient visit   Patient: [name]   DOB: 05/02/1945   76 y.o. Female  MRN: [id number] Visit Date: 09/27/2021   No chief complaint on file.  Subjective    HPI  Follow up visit -had new onset of atrial fibrillation. Started on amiodarone.  -now seeing cardiology  =blood pressure was a bit low at last visit -had bilateral lower extremity edema.    Medications: Outpatient Medications Prior to Visit  Medication Sig   acetaminophen (TYLENOL) 500 MG tablet Take 500 mg by mouth every 6 (six) hours as needed for moderate pain or fever.   albuterol (PROAIR HFA) 108 (90 Base) MCG/ACT inhaler 2 puffs up to every 4 hours as needed only  if your can't catch your breath (Patient taking differently: Inhale 2 puffs into the lungs every 4 (four) hours as needed for shortness of breath.)   albuterol (PROVENTIL) (2.5 MG/3ML) 0.083% nebulizer solution Take 2.5 mg by nebulization 3 (three) times daily as needed for wheezing or shortness of breath.   amiodarone (PACERONE) 100 MG tablet Take 1 tablet (100 mg total) by mouth daily.   aspirin 81 MG tablet Take 81 mg by mouth at bedtime.   chlorpheniramine-HYDROcodone 10-8 MG/5ML Take 5 mLs by mouth at bedtime as needed for cough.   Coenzyme Q10 (CO Q 10 PO) Take 1 capsule by mouth daily.   fluconazole (DIFLUCAN) 100 MG tablet Take 1 tablet (100 mg total) by mouth daily.   furosemide (LASIX) 20 MG tablet Take 1 tablet po BID for 5 days then go back to taking 1 tablet po QD   levothyroxine (SYNTHROID) 25 MCG tablet TAKE 0.5 TABLETS (12.5 MCG TOTAL) BY MOUTH DAILY. IN ADDITION TO THE 50 MCG. (Patient taking differently: Take 12.5 mcg by mouth See admin instructions. 12.5 mcg daily except on sunday)   levothyroxine (SYNTHROID) 50 MCG tablet TAKE 1 TABLET BY MOUTH EVERY DAY BEFORE BREAKFAST (Patient taking differently: Take 50 mcg by mouth See admin instructions. Along with 12.5 mcg qd except on Sunday (pt just takes 50 mcg))    lidocaine-prilocaine (EMLA) cream Apply 1 Application topically as needed. (Patient taking differently: Apply 1 Application topically as needed (prior to port access).)   metoprolol succinate (TOPROL-XL) 100 MG 24 hr tablet Take 1 tablet (100 mg total) by mouth daily. Take with or immediately following a meal. (Patient taking differently: Take 100 mg by mouth daily.)   Omega-3 Fatty Acids (OMEGA-3 PO) Take 1,000 mg by mouth in the morning and at bedtime.   OXYGEN Inhale 3.5 L into the lungs continuous.   potassium chloride SA (KLOR-CON M) 10 MEQ tablet Take 1 tablet (10 mEq total) by mouth daily.   prochlorperazine (COMPAZINE) 10 MG tablet Take 1 tablet (10 mg total) by mouth every 6 (six) hours as needed.   rosuvastatin (CRESTOR) 40 MG tablet Take 1 tablet (40 mg total) by mouth daily. (Patient taking differently: Take 40 mg by mouth every evening.)   Tiotropium Bromide-Olodaterol (STIOLTO RESPIMAT) 2.5-2.5 MCG/ACT AERS Inhale 2 puffs into the lungs daily.   Tiotropium Bromide-Olodaterol (STIOLTO RESPIMAT) 2.5-2.5 MCG/ACT AERS Inhale 2 puffs into the lungs daily.   No facility-administered medications prior to visit.    Review of Systems  {Labs (Optional):23779}   Objective    There were no vitals taken for this visit. BP Readings from Last 3 Encounters:  09/26/21 106/64  09/17/21 110/69  09/10/21 (!) 100/56    Wt Readings from Last 3 Encounters:  09/26/21 206 lb (  93.4 kg)  09/10/21 204 lb 4 oz (92.6 kg)  09/03/21 207 lb 14.4 oz (94.3 kg)    Physical Exam  ***  No results found for any visits on 09/27/21.  Assessment & Plan     Problem List Items Addressed This Visit   None    No follow-ups on file.         [name], NP  [facility] [phone] (phone) [phone] (fax)  [place]

## 2021-09-26 NOTE — Patient Instructions (Signed)
Medication Instructions:  The current medical regimen is effective;  continue present plan and medications as directed. Please refer to the Current Medication list given to you today.   *If you need a refill on your cardiac medications before your next appointment, please call your pharmacy*  Lab Work:   Testing/Procedures:  NONE    NONE If you have labs (blood work) drawn today and your tests are completely normal, you will receive your results only by: [place] (if you have MyChart) OR  A paper copy in the mail If you have any lab test that is abnormal or we need to change your treatment, we will call you to review the results.  Special Instructions INCREASE HYDRATION MAY DRINK SPORTS DRINKS  CHANGE POSITIONS SLOWLY  PLEASE PURCHASE AND WEAR COMPRESSION STOCKINGS DAILY AND TAKE OFF AT BEDTIME. Compression stockings are elastic socks that squeeze the legs. They help to increase blood flow to the legs and to decrease swelling in the legs from fluid retention, and reduce the chance of developing blood clots in the lower legs. Please put on in the AM when dressing and off at night when dressing for bed.  LET THEM KNOW THAT YOU NEED KNEE HIGH'S WITH COMPRESSION OF 15-20 mmhg.  [employer]  [place] ([place] [phone]); [place], [place] [phone]; [phone]  EMAIL   [email or web address].  PLEASE MAKE SURE TO ELEVATE YOUR FEET & LEGS WHILE SITTING, THIS WILL HELP WITH THE SWELLING ALSO.    Follow-Up: Your next appointment:  3-4 month(s) In Person with  Coletta Memos, FNP      At [facility], you and your health needs are our priority.  As part of our continuing mission to provide you with exceptional heart care, we have created designated Provider Care Teams.  These Care Teams include your primary Cardiologist (physician) and Advanced Practice Providers (APPs -  Physician Assistants and Nurse Practitioners) who all work together to provide you with the care you  need, when you need it.  Important Information About Sugar

## 2021-09-27 ENCOUNTER — Ambulatory Visit (INDEPENDENT_AMBULATORY_CARE_PROVIDER_SITE_OTHER): Payer: Medicare HMO | Admitting: Nurse Practitioner

## 2021-09-27 ENCOUNTER — Other Ambulatory Visit: Payer: Self-pay

## 2021-09-27 ENCOUNTER — Encounter: Payer: Self-pay | Admitting: Nurse Practitioner

## 2021-09-27 VITALS — BP 89/60 | HR 83 | Temp 97.7°F | Ht 63.0 in | Wt 206.0 lb

## 2021-09-27 DIAGNOSIS — J449 Chronic obstructive pulmonary disease, unspecified: Secondary | ICD-10-CM

## 2021-09-27 DIAGNOSIS — Z9981 Dependence on supplemental oxygen: Secondary | ICD-10-CM

## 2021-09-27 DIAGNOSIS — I4891 Unspecified atrial fibrillation: Secondary | ICD-10-CM | POA: Diagnosis not present

## 2021-09-27 DIAGNOSIS — I10 Essential (primary) hypertension: Secondary | ICD-10-CM

## 2021-09-27 DIAGNOSIS — E785 Hyperlipidemia, unspecified: Secondary | ICD-10-CM | POA: Diagnosis not present

## 2021-09-27 MED ORDER — ROSUVASTATIN CALCIUM 40 MG PO TABS: 40.0000 mg | ORAL_TABLET | Freq: Every day | ORAL | 3 refills | Status: DC

## 2021-09-27 MED ORDER — METOPROLOL SUCCINATE ER 100 MG PO TB24: 100.0000 mg | ORAL_TABLET | Freq: Every day | ORAL | 1 refills | Status: DC

## 2021-09-28 ENCOUNTER — Telehealth: Payer: Self-pay | Admitting: Cardiovascular Disease

## 2021-09-28 ENCOUNTER — Other Ambulatory Visit: Payer: Self-pay

## 2021-09-28 NOTE — Telephone Encounter (Signed)
Spoke to patient she stated when she picked up Amiodarone refill it was 100 mg tablets.Stated she has been taking 200 mg daily.Stated when she saw Coletta Memos NP last week she does not remember him decreasing dose.Advised I will send message to [facility] for a clarification.

## 2021-09-28 NOTE — Telephone Encounter (Signed)
Pt c/o medication issue:  1. Name of Medication:   amiodarone (PACERONE) 100 MG tablet    2. How are you currently taking this medication (dosage and times per day)?  tablet Take 1 tablet (100 mg total) by mouth daily.   3. Are you having a reaction (difficulty breathing--STAT)? No  4. What is your medication issue? Pt would like clarification on whether she soul be taking the above dosage or 200 MG. Please advise

## 2021-10-01 NOTE — Telephone Encounter (Signed)
Called patient left Jesse's advice on personal voice mail.Advised to take Amiodarone 100 mg daily.Advised to call back if she needs a new prescription.

## 2021-10-02 NOTE — Progress Notes (Unsigned)
@Patient  ID: [name], female    DOB: [birth year], 76 y.o.   MRN: [id number]  No chief complaint on file.   Referring provider: [name], NP  HPI: 76 year old female, active smoker followed for COPD, left recurrent lung cancer, left lung collapse.  She is a patient Dr. Gustavus Bryant and last seen in office on 08/22/2021 by [facility] NP.  Past medical history significant for CAD, CHF, PAF, hypertension, GERD, hypothyroidism, IDA, HLD.  She has a history of left upper lobe adenocarcinoma post lobectomy 2012.  Found to have an enlarging left lower lobe nodule in 2022 and underwent SBRT with resolution.  In May 2023, she was found to have a hypermetabolic large left hilar mass with associated left-sided volume loss.  She underwent bronchoscopy with Dr. Roxan Hockey on 6/9 and was diagnosed with recurrent non-small cell cancer.  The following day she was admitted to [facility] for leg swelling and shortness of breath.  She was found to have a large left-sided pleural effusion which chest tube was placed for.  Effusion did not resolve despite drainage and diuresis.  She was started on steroids and discharged with plans to see radiation oncology the same day.  Radiation was started 6/15 and was repeated daily with plans to start chemotherapy in near future.  She presented to radiation therapy on 6/20 and was noted to be tachycardic-sent to [facility] long ED and found to be in SVT.  CXR during the stay was also significant for complete opacification of the left hemithorax; no intervention.  She was treated with IV metoprolol and discharged home.  She went back to the ED on 6/21 with palpitations and hot flashes.  She was found to be in SVT again and given 2 doses of adenosine, which was unsuccessful.  She then received IV beta-blocker with rate control and A-fib was seen; cardiology consulted.  During her most recent hospital stay, a repeat CT scan showed full left lung atelectasis, proximal left hilar mass and  pleural fluid with suspected partial loculations.  PCCM was consulted to discuss possible intervention.  It was decided that there was no indication for chest tube placement as she did not have any previous improvement with this due to proximal obstruction.  There was also no role for VATS decortication.  Recommended waiting until she completed radiation and started chemotherapy to reevaluate to see if proximal obstruction had resolved/improved and if she would be a candidate for VATS or Pleurx catheter. She was discharged on 6/24 with plans to follow up outpatient.   TEST/EVENTS:  04/2015 spiro: ratio 59, FEV1 39 09/11/2020 PFTs: FVC 47, FEV1 52, ratio 78, TLC 59, DLCOcor 48 08/07/2021 CT chest with contrast: Atherosclerosis/CAD.  There is no evidence of central or segmental PE.  The known left hilar mass is poorly visualized and measuring up to 4 cm.  There is interval complete hypodense heterogeneous opacification of the left lung.  There is interval development of at least a small volume of left pleural fluid.  The right lung is clear.  08/22/2021: Today-follow-up Patient presents today for hospital follow-up.  Since she was discharged, she has started concurrent chemoradiation.  She completed cycle 1 of carboplatin/paclitaxel and did not have any major side effects.  She started cycle 2 this week. She reports that her breathing is overall stable since discharge, possibly with slight improvement in activity tolerance. She does not feel back to her baseline and still gets winded with daily activities such as dressing. She also has begun  to cough up some clear sputum again, which is normal for her. She denies any hemoptysis, wheezing, fevers, night sweats, weight loss. She rarely uses her albuterol rescue or nebulizer, but has felt like the nebulizer helps in the past. She was also previously on Incruse but stopped it because she wasn't sure how to use it properly. She did feel like it helped her breathing  though.   Allergies  Allergen Reactions   Codeine Other (See Comments)    Unknown reaction Patient avoids this medication    Immunization History  Administered Date(s) Administered   Pneumococcal Polysaccharide-23 01/28/2019   Tdap 02/02/2019   Zoster Recombinat (Shingrix) 06/14/2019    Past Medical History:  Diagnosis Date   CAD (coronary artery disease)    Cancer (HCC)    COPD (chronic obstructive pulmonary disease) ([place])    per DM note, pt denies   Dyslipidemia    GERD (gastroesophageal reflux disease)    History of radiation therapy    Left lung- 01/16/21-01/30/21- Dr. Gery Pray   HTN (hypertension)    Hypothyroidism    Myocardial infarct [facility])    Non-small cell lung cancer ([place]) 08/19/2010   Stage IA, status post left upper lobectomy July 2012   Tobacco abuse     Tobacco History: Social History   Tobacco Use  Smoking Status Former   Packs/day: 1.00   Years: 58.00   Total pack years: 58.00   Types: Cigarettes   Quit date: 03/21/2020   Years since quitting: 1.5  Smokeless Tobacco Former  Tobacco Comments   Pt states she is currently smoking   Counseling given: Not Answered Tobacco comments: Pt states she is currently smoking   Outpatient Medications Prior to Visit  Medication Sig Dispense Refill   acetaminophen (TYLENOL) 500 MG tablet Take 500 mg by mouth every 6 (six) hours as needed for moderate pain or fever.     albuterol (PROAIR HFA) 108 (90 Base) MCG/ACT inhaler 2 puffs up to every 4 hours as needed only  if your can't catch your breath (Patient taking differently: Inhale 2 puffs into the lungs every 4 (four) hours as needed for shortness of breath.)     albuterol (PROVENTIL) (2.5 MG/3ML) 0.083% nebulizer solution Take 2.5 mg by nebulization 3 (three) times daily as needed for wheezing or shortness of breath.     amiodarone (PACERONE) 100 MG tablet Take 1 tablet (100 mg total) by mouth daily. 30 tablet 6   aspirin 81 MG tablet Take 81 mg by mouth  at bedtime.     chlorpheniramine-HYDROcodone 10-8 MG/5ML Take 5 mLs by mouth at bedtime as needed for cough.     Coenzyme Q10 (CO Q 10 PO) Take 1 capsule by mouth daily.     fluconazole (DIFLUCAN) 100 MG tablet Take 1 tablet (100 mg total) by mouth daily. 10 tablet 0   furosemide (LASIX) 20 MG tablet Take 1 tablet po BID for 5 days then go back to taking 1 tablet po QD 90 tablet 1   levothyroxine (SYNTHROID) 25 MCG tablet TAKE 0.5 TABLETS (12.5 MCG TOTAL) BY MOUTH DAILY. IN ADDITION TO THE 50 MCG. (Patient taking differently: Take 12.5 mcg by mouth See admin instructions. 12.5 mcg daily except on sunday) 45 tablet 1   levothyroxine (SYNTHROID) 50 MCG tablet TAKE 1 TABLET BY MOUTH EVERY DAY BEFORE BREAKFAST (Patient taking differently: Take 50 mcg by mouth See admin instructions. Along with 12.5 mcg qd except on Sunday (pt just takes 50 mcg)) 90 tablet  3   lidocaine-prilocaine (EMLA) cream Apply 1 Application topically as needed. (Patient taking differently: Apply 1 Application topically as needed (prior to port access).) 30 g 2   metoprolol succinate (TOPROL-XL) 100 MG 24 hr tablet Take 1 tablet (100 mg total) by mouth daily. Take with or immediately following a meal. 90 tablet 1   Omega-3 Fatty Acids (OMEGA-3 PO) Take 1,000 mg by mouth in the morning and at bedtime.     OXYGEN Inhale 3.5 L into the lungs continuous.     potassium chloride SA (KLOR-CON M) 10 MEQ tablet Take 1 tablet (10 mEq total) by mouth daily. 30 tablet 3   prochlorperazine (COMPAZINE) 10 MG tablet Take 1 tablet (10 mg total) by mouth every 6 (six) hours as needed. 30 tablet 2   rosuvastatin (CRESTOR) 40 MG tablet Take 1 tablet (40 mg total) by mouth daily. 90 tablet 3   Tiotropium Bromide-Olodaterol (STIOLTO RESPIMAT) 2.5-2.5 MCG/ACT AERS Inhale 2 puffs into the lungs daily. 4 g 5   Tiotropium Bromide-Olodaterol (STIOLTO RESPIMAT) 2.5-2.5 MCG/ACT AERS Inhale 2 puffs into the lungs daily. 4 g 0   No facility-administered  medications prior to visit.     Review of Systems:   Constitutional: No weight loss or gain, night sweats, fevers, chills, fatigue, or lassitude. HEENT: No headaches, difficulty swallowing, tooth/dental problems, or sore throat. No sneezing, itching, ear ache, nasal congestion, or post nasal drip CV:  No chest pain, orthopnea, PND, swelling in lower extremities, anasarca, dizziness, palpitations, syncope Resp: +shortness of breath with exertion; chronic productive cough. No excess mucus or change in color of mucus. No hemoptysis. No wheezing.  No chest wall deformity Skin: No rash, lesions, ulcerations MSK:  No joint pain or swelling.  No decreased range of motion.  No back pain. Neuro: No dizziness or lightheadedness.  Psych: No depression or anxiety. Mood stable.     Physical Exam:  There were no vitals taken for this visit.  GEN: Pleasant, interactive, chronically-ill appearing; obese; in no acute distress. HEENT:  Normocephalic and atraumatic. PERRLA. Sclera white. Nasal turbinates pink, moist and patent bilaterally. No rhinorrhea present. Oropharynx pink and moist, without exudate or edema. No lesions, ulcerations, or postnasal drip.  NECK:  Supple w/ fair ROM. No JVD present. Normal carotid impulses w/o bruits. Thyroid symmetrical with no goiter or nodules palpated. No lymphadenopathy.   CV: RRR, no m/r/g, no peripheral edema. Pulses intact, +2 bilaterally. No cyanosis, pallor or clubbing. PULMONARY:  Unlabored, regular breathing. Decreased left lung sounds with minimal end expiratory wheeze; right lung clear A&P w/o wheezes/rales/rhonchi. No accessory muscle use. No dullness to percussion. GI: BS present and normoactive. Soft, non-tender to palpation.  Neuro: A/Ox3. No focal deficits noted.   Skin: Warm, no lesions or rashe Psych: Normal affect and behavior. Judgement and thought content appropriate.     Lab Results:  CBC    Component Value Date/Time   WBC 3.1 (L)  09/17/2021 1047   WBC 10.7 (H) 08/11/2021 0316   RBC 3.34 (L) 09/17/2021 1047   HGB 9.1 (L) 09/17/2021 1047   HGB 13.3 05/02/2021 1055   HCT 29.3 (L) 09/17/2021 1047   HCT 40.8 05/02/2021 1055   PLT 160 09/17/2021 1047   PLT 220 05/02/2021 1055   MCV 87.7 09/17/2021 1047   MCV 88 05/02/2021 1055   MCH 27.2 09/17/2021 1047   MCHC 31.1 09/17/2021 1047   RDW 20.4 (H) 09/17/2021 1047   RDW 13.3 05/02/2021 1055   LYMPHSABS 0.5 (L)  09/17/2021 1047   LYMPHSABS 2.2 10/25/2020 0935   MONOABS 0.4 09/17/2021 1047   EOSABS 0.0 09/17/2021 1047   EOSABS 0.1 10/25/2020 0935   BASOSABS 0.0 09/17/2021 1047   BASOSABS 0.1 10/25/2020 0935    BMET    Component Value Date/Time   NA 141 09/17/2021 1047   NA 143 05/02/2021 1055   K 3.7 09/17/2021 1047   CL 100 09/17/2021 1047   CO2 38 (H) 09/17/2021 1047   GLUCOSE 113 (H) 09/17/2021 1047   BUN 11 09/17/2021 1047   BUN 13 05/02/2021 1055   CREATININE 0.84 09/17/2021 1047   CREATININE 0.81 07/02/2016 1323   CALCIUM 8.9 09/17/2021 1047   GFRNONAA >60 09/17/2021 1047   GFRAA 98 04/04/2020 1418    BNP    Component Value Date/Time   BNP 360.1 (H) 08/08/2021 2154     Imaging:  No results found.  CARBOplatin (PARAPLATIN) 200 mg in sodium chloride 0.9 % 100 mL chemo infusion     Date Action Dose Route User   08/13/2021 1705 Infusion Verify (none) Intravenous Anastasia Pall, RN   08/13/2021 1705 Rate/Dose Change (none) Intravenous Anastasia Pall, RN   08/13/2021 1705 New Bag/Given 200 mg Intravenous Anastasia Pall, RN      CARBOplatin (PARAPLATIN) 200 mg in sodium chloride 0.9 % 250 mL chemo infusion     Date Action Dose Route User   08/20/2021 1233 Rate/Dose Change (none) Intravenous Fara Boros, RN   08/20/2021 1233 Rate/Dose Change (none) Intravenous Fara Boros, RN   08/20/2021 1203 Rate/Dose Change (none) Intravenous Fara Boros, RN   08/20/2021 1203 New Bag/Given 200 mg Intravenous Fara Boros, RN       CARBOplatin (PARAPLATIN) 200 mg in sodium chloride 0.9 % 250 mL chemo infusion     Date Action Dose Route User   08/27/2021 1141 Infusion Verify (none) Intravenous Scot Dock, RN   08/27/2021 1141 Rate/Dose Change (none) Intravenous Scot Dock, RN   08/27/2021 1140 New Bag/Given 200 mg Intravenous Georgette Shell T, RN      CARBOplatin (PARAPLATIN) 200 mg in sodium chloride 0.9 % 250 mL chemo infusion     Date Action Dose Route User   09/03/2021 1411 Rate/Dose Change (none) Intravenous Lester [place], RN   09/03/2021 1411 Rate/Dose Change (none) Intravenous Lester [place], RN   09/03/2021 1339 Rate/Dose Change (none) Intravenous Lester [place], RN   09/03/2021 1339 New Bag/Given 200 mg Intravenous Lester [place], RN      CARBOplatin (PARAPLATIN) 200 mg in sodium chloride 0.9 % 250 mL chemo infusion     Date Action Dose Route User   09/10/2021 1518 Infusion Verify (none) Intravenous Alvera Singh, RN   09/10/2021 1518 Rate/Dose Change (none) Intravenous Alvera Singh, RN   09/10/2021 1518 New Bag/Given 200 mg Intravenous Alvera Singh, RN      CARBOplatin (PARAPLATIN) 200 mg in sodium chloride 0.9 % 100 mL chemo infusion     Date Action Dose Route User   09/17/2021 1438 Rate/Dose Change (none) Intravenous Rafael Bihari, RN   09/17/2021 1437 Rate/Dose Change (none) Intravenous Rafael Bihari, RN   09/17/2021 1407 Rate/Dose Change (none) Intravenous Rafael Bihari, RN   09/17/2021 1407 New Bag/Given 200 mg Intravenous Lester [place], RN      dexamethasone (DECADRON) 10 mg in sodium chloride 0.9 % 50 mL IVPB     Date Action Dose Route User   08/13/2021  1202 Infusion Verify (none) Intravenous Clyda Hurdle, [place]   08/13/2021 1147 Rate/Dose Change (none) Intravenous Clyda Hurdle, [place]   08/13/2021 1147 New Bag/Given 10 mg Intravenous Georgette Shell T, RN      dexamethasone (DECADRON) 10 mg in sodium chloride 0.9 % 50 mL IVPB     Date Action Dose  Route User   08/20/2021 0950 Rate/Dose Change (none) Intravenous Fara Boros, RN   08/20/2021 4235 New Bag/Given 10 mg Intravenous Belva Chimes, RN      dexamethasone (DECADRON) 10 mg in sodium chloride 0.9 % 50 mL IVPB     Date Action Dose Route User   08/27/2021 0919 Infusion Verify (none) Intravenous Clyda Hurdle, RN   08/27/2021 3614 Rate/Dose Change (none) Intravenous Clyda Hurdle, RN   08/27/2021 4315 New Bag/Given 10 mg Intravenous Georgette Shell T, RN      dexamethasone (DECADRON) 10 mg in sodium chloride 0.9 % 50 mL IVPB     Date Action Dose Route User   09/03/2021 1147 Rate/Dose Change (none) Intravenous Lester [place], RN   09/03/2021 1143 Infusion Verify (none) Intravenous Lester [place], RN   09/03/2021 1127 Infusion Verify (none) Intravenous Lester [place], [place]   09/03/2021 1127 Rate/Dose Change (none) Intravenous Lester [place], RN   09/03/2021 1127 New Bag/Given 10 mg Intravenous Lester [place], RN      dexamethasone (DECADRON) 10 mg in sodium chloride 0.9 % 50 mL IVPB     Date Action Dose Route User   09/10/2021 1311 Rate/Dose Change (none) Intravenous Alvera Singh, RN   09/10/2021 1311 New Bag/Given 10 mg Intravenous Alvera Singh, RN      dexamethasone (DECADRON) 10 mg in sodium chloride 0.9 % 50 mL IVPB     Date Action Dose Route User   09/17/2021 1157 Rate/Dose Change (none) Intravenous Rafael Bihari, RN   09/17/2021 1157 New Bag/Given 10 mg Intravenous Rafael Bihari, RN      diphenhydrAMINE (BENADRYL) injection 50 mg     Date Action Dose Route User   08/13/2021 1225 Given 50 mg Intravenous Georgette Shell T, RN      diphenhydrAMINE (BENADRYL) injection 50 mg     Date Action Dose Route User   08/20/2021 0921 Given 50 mg Intravenous Danton Sewer B, RN      diphenhydrAMINE (BENADRYL) injection 50 mg     Date Action Dose Route User   08/27/2021 0942 Given 50 mg Intravenous Georgette Shell T, RN      diphenhydrAMINE (BENADRYL)  injection 50 mg     Date Action Dose Route User   09/03/2021 1054 Given 50 mg Intravenous Lester [place], RN      diphenhydrAMINE (BENADRYL) injection 50 mg     Date Action Dose Route User   09/10/2021 1247 Given 50 mg Intravenous Alvera Singh, RN      diphenhydrAMINE (BENADRYL) injection 50 mg     Date Action Dose Route User   09/17/2021 1133 Given 50 mg Intravenous Rafael Bihari, RN      famotidine (PEPCID) IVPB 20 mg premix     Date Action Dose Route User   08/13/2021 1219 Infusion Verify (none) Intravenous Clyda Hurdle, RN   08/13/2021 1204 Rate/Dose Change (none) Intravenous Clyda Hurdle, RN   08/13/2021 1204 New Bag/Given 20 mg Intravenous Georgette Shell T, RN      famotidine (PEPCID) IVPB 20 mg premix     Date Action Dose  Route User   08/20/2021 [phone] Infusion Verify (none) Intravenous Fara Boros, RN   08/20/2021 0932 Rate/Dose Change (none) Intravenous Fara Boros, RN   08/20/2021 0932 New Bag/Given 20 mg Intravenous Fara Boros, RN      famotidine (PEPCID) IVPB 20 mg premix     Date Action Dose Route User   08/27/2021 [phone] Rate/Dose Change (none) Intravenous Clyda Hurdle, [place]   08/27/2021 5852 New Bag/Given 20 mg Intravenous Georgette Shell T, RN      famotidine (PEPCID) IVPB 20 mg premix     Date Action Dose Route User   09/03/2021 1123 Infusion Verify (none) Intravenous Lester [place], [place]   09/03/2021 1108 Rate/Dose Change (none) Intravenous Lester [place], RN   09/03/2021 1108 New Bag/Given 20 mg Intravenous Lester [place], RN      famotidine (PEPCID) IVPB 20 mg premix     Date Action Dose Route User   09/10/2021 1252 Rate/Dose Change (none) Intravenous Alvera Singh, RN   09/10/2021 1252 New Bag/Given 20 mg Intravenous Alvera Singh, RN      famotidine (PEPCID) IVPB 20 mg premix     Date Action Dose Route User   09/17/2021 1141 Infusion Verify (none) Intravenous Rafael Bihari, RN   09/17/2021 1141 Rate/Dose  Change (none) Intravenous Rafael Bihari, RN   09/17/2021 1141 New Bag/Given 20 mg Intravenous Rafael Bihari, RN      heparin lock flush 100 unit/mL     Date Action Dose Route User   08/20/2021 1238 Given 500 Units Intracatheter Danton Sewer B, RN      heparin lock flush 100 unit/mL     Date Action Dose Route User   08/27/2021 1227 Given 500 Units Intracatheter Scot Dock, RN      heparin lock flush 100 unit/mL     Date Action Dose Route User   09/03/2021 1418 Given 500 Units Intracatheter Lester [place], [place]      heparin lock flush 100 unit/mL     Date Action Dose Route User   09/10/2021 1609 Given 500 Units Intracatheter Alvera Singh, RN      heparin lock flush 100 unit/mL     Date Action Dose Route User   09/17/2021 1443 Given 500 Units Intracatheter Rafael Bihari, RN      PACLitaxel (TAXOL) 90 mg in sodium chloride 0.9 % 250 mL chemo infusion (</= 80mg /m2)     Date Action Dose Route User   08/13/2021 1609 Rate/Dose Change (none) Intravenous Person, Blaine Hamper, RN   08/13/2021 1551 Rate/Dose Change (none) Intravenous Clyda Hurdle, RN   08/13/2021 1533 Rate/Dose Change (none) Intravenous Clyda Hurdle, RN   08/13/2021 1514 Restarted (none) Intravenous Clyda Hurdle, [place]   08/13/2021 1337 Rate/Dose Change (none) Intravenous Georgette Shell T, RN      PACLitaxel (TAXOL) 90 mg in sodium chloride 0.9 % 250 mL chemo infusion (</= 80mg /m2)     Date Action Dose Route User   08/20/2021 1156 Rate/Dose Change (none) Intravenous Fara Boros, RN   08/20/2021 1151 Rate/Dose Change (none) Intravenous Fara Boros, RN   08/20/2021 1151 Rate/Dose Change (none) Intravenous Fara Boros, RN   08/20/2021 1051 Rate/Dose Change (none) Intravenous Fara Boros, RN   08/20/2021 1051 New Bag/Given 90 mg Intravenous Fara Boros, RN      PACLitaxel (TAXOL) 90 mg in sodium chloride 0.9 % 250 mL chemo infusion (</= 80mg /m2)  Date Action Dose Route User    08/27/2021 1032 Rate/Dose Change (none) Intravenous Scot Dock, RN   08/27/2021 1032 New Bag/Given 90 mg Intravenous Georgette Shell T, RN      PACLitaxel (TAXOL) 90 mg in sodium chloride 0.9 % 250 mL chemo infusion (</= 80mg /m2)     Date Action Dose Route User   09/03/2021 1328 Rate/Dose Change (none) Intravenous Lester [place], [place]   09/03/2021 1327 Rate/Dose Change (none) Intravenous Lester [place], [place]   09/03/2021 1227 Rate/Dose Change (none) Intravenous Lester [place], RN   09/03/2021 1227 New Bag/Given 90 mg Intravenous Lester [place], RN      PACLitaxel (TAXOL) 90 mg in sodium chloride 0.9 % 250 mL chemo infusion (</= 80mg /m2)     Date Action Dose Route User   09/10/2021 1408 Infusion Verify (none) Intravenous Alvera Singh, RN   09/10/2021 1408 Rate/Dose Change (none) Intravenous Alvera Singh, RN   09/10/2021 1408 New Bag/Given 90 mg Intravenous Alvera Singh, RN      PACLitaxel (TAXOL) 90 mg in sodium chloride 0.9 % 250 mL chemo infusion (</= 80mg /m2)     Date Action Dose Route User   09/17/2021 1353 Rate/Dose Change (none) Intravenous Rafael Bihari, RN   09/17/2021 1352 Rate/Dose Change (none) Intravenous Rafael Bihari, RN   09/17/2021 1252 Rate/Dose Change (none) Intravenous Rafael Bihari, RN   09/17/2021 1252 New Bag/Given 90 mg Intravenous Rafael Bihari, RN      palonosetron (ALOXI) injection 0.25 mg     Date Action Dose Route User   08/13/2021 1220 Given 0.25 mg Intravenous Georgette Shell T, RN      palonosetron (ALOXI) injection 0.25 mg     Date Action Dose Route User   08/20/2021 [phone] Given 0.25 mg Intravenous Fara Boros, RN      palonosetron (ALOXI) injection 0.25 mg     Date Action Dose Route User   08/27/2021 0940 Given 0.25 mg Intravenous Georgette Shell T, RN      palonosetron (ALOXI) injection 0.25 mg     Date Action Dose Route User   09/03/2021 1051 Given 0.25 mg Intravenous Lester [place], RN       palonosetron (ALOXI) injection 0.25 mg     Date Action Dose Route User   09/10/2021 1244 Given 0.25 mg Intravenous Alvera Singh, RN      palonosetron (ALOXI) injection 0.25 mg     Date Action Dose Route User   09/17/2021 1131 Given 0.25 mg Intravenous Rafael Bihari, RN      0.9 %  sodium chloride infusion     Date Action Dose Route User   08/13/2021 1514 Restarted (none) Intravenous Clyda Hurdle, [place]   08/13/2021 1324 Restarted (none) Intravenous Clyda Hurdle, RN   08/13/2021 1317 Infusion Verify (none) Intravenous Clyda Hurdle, RN   08/13/2021 1317 Infusion Verify (none) Intravenous Clyda Hurdle, RN   08/13/2021 1302 Infusion Verify (none) Intravenous Georgette Shell T, RN      0.9 %  sodium chloride infusion     Date Action Dose Route User   08/20/2021 1239 Rate/Dose Change (none) Intravenous Fara Boros, RN   08/20/2021 1236 Rate/Dose Change (none) Intravenous Fara Boros, RN   08/20/2021 1203 Infusion Verify (none) Intravenous Fara Boros, RN   08/20/2021 1201 Rate/Dose Change (none) Intravenous Fara Boros, RN   08/20/2021 1156 Rate/Dose Change (none) Intravenous Fara Boros, RN  0.9 %  sodium chloride infusion     Date Action Dose Route User   08/27/2021 1217 Infusion Verify (none) Intravenous Scot Dock, RN   08/27/2021 1216 Rate/Dose Change (none) Intravenous Scot Dock, RN   08/27/2021 1213 Rate/Dose Change (none) Intravenous Scot Dock, RN   08/27/2021 1141 Rate/Dose Change (none) Intravenous Scot Dock, RN   08/27/2021 1136 Rate/Dose Change (none) Intravenous Scot Dock, RN      0.9 %  sodium chloride infusion     Date Action Dose Route User   09/03/2021 1417 Rate/Dose Change (none) Intravenous Lester [place], [place]   09/03/2021 1414 Rate/Dose Change (none) Intravenous Lester [place], RN   09/03/2021 1339 Infusion Verify (none) Intravenous Lester [place], [place]   09/03/2021 1339 Rate/Dose Change (none) Intravenous  Lester [place], [place]   09/03/2021 1334 Rate/Dose Change (none) Intravenous Lester [place], RN      0.9 %  sodium chloride infusion     Date Action Dose Route User   09/10/2021 1556 Rate/Dose Change (none) Intravenous Alvera Singh, RN   09/10/2021 1551 Rate/Dose Change (none) Intravenous Alvera Singh, RN   09/10/2021 1517 Infusion Verify (none) Intravenous Alvera Singh, RN   09/10/2021 1516 Rate/Dose Change (none) Intravenous Alvera Singh, RN   09/10/2021 1511 Rate/Dose Change (none) Intravenous Alvera Singh, RN      0.9 %  sodium chloride infusion     Date Action Dose Route User   09/17/2021 1446 Rate/Dose Change (none) Intravenous Rafael Bihari, RN   09/17/2021 1441 Rate/Dose Change (none) Intravenous Rafael Bihari, RN   09/17/2021 1407 Infusion Verify (none) Intravenous Rafael Bihari, RN   09/17/2021 1407 Rate/Dose Change (none) Intravenous Rafael Bihari, RN   09/17/2021 1401 Rate/Dose Change (none) Intravenous Rafael Bihari, RN      sodium chloride flush (NS) 0.9 % injection 10 mL     Date Action Dose Route User   08/20/2021 0823 Given 10 mL Intracatheter Lynnae Sandhoff, LPN      sodium chloride flush (NS) 0.9 % injection 10 mL     Date Action Dose Route User   08/20/2021 1238 Given 10 mL Intracatheter Danton Sewer B, RN      sodium chloride flush (NS) 0.9 % injection 10 mL     Date Action Dose Route User   08/27/2021 0755 Given 10 mL Intracatheter Elizebeth Brooking, LPN      sodium chloride flush (NS) 0.9 % injection 10 mL     Date Action Dose Route User   08/27/2021 1227 Given 10 mL Intracatheter Scot Dock, RN      sodium chloride flush (NS) 0.9 % injection 10 mL     Date Action Dose Route User   09/03/2021 0758 Given 10 mL Intracatheter Elizebeth Brooking, LPN      sodium chloride flush (NS) 0.9 % injection 10 mL     Date Action Dose Route User   09/03/2021 1417 Given 10 mL Intracatheter Lester , RN      sodium chloride flush (NS) 0.9 % injection 10 mL     Date Action Dose Route User   09/10/2021 1143 Given 10 mL Intracatheter Scot Dock, RN      sodium chloride flush (NS) 0.9 % injection 10 mL     Date Action Dose Route User   09/10/2021 1606 Given 10 mL Intracatheter Anhaiser, Guadelupe Sabin, RN  sodium chloride flush (NS) 0.9 % injection 10 mL     Date Action Dose Route User   09/17/2021 1047 Given 10 mL Intracatheter Gobble, Jasmine A, LPN      sodium chloride flush (NS) 0.9 % injection 10 mL     Date Action Dose Route User   09/17/2021 1443 Given 10 mL Intracatheter Rafael Bihari, RN          Latest Ref Rng & Units 09/11/2020   12:46 PM  PFT Results  FVC-Pre L 1.38   FVC-Predicted Pre % 47   FVC-Post L 1.44   FVC-Predicted Post % 50   Pre FEV1/FVC % % 83   Post FEV1/FCV % % 78   FEV1-Pre L 1.14   FEV1-Predicted Pre % 52   FEV1-Post L 1.12   DLCO uncorrected ml/min/mmHg 9.54   DLCO UNC% % 49   DLCO corrected ml/min/mmHg 9.38   DLCO COR %Predicted % 48   DLVA Predicted % 83   TLC L 3.02   TLC % Predicted % 59   RV % Predicted % 61     No results found for: "NITRICOXIDE"      Assessment & Plan:   No problem-specific Assessment & Plan notes found for this encounter.    I spent 42 minutes of dedicated to the care of this patient on the date of this encounter to include pre-visit review of records, face-to-face time with the patient discussing conditions above, post visit ordering of testing, clinical documentation with the electronic health record, making appropriate referrals as documented, and communicating necessary findings to members of the patients care team.  Clayton Bibles, NP 10/02/2021  Pt aware and understands NP's role.

## 2021-10-03 ENCOUNTER — Telehealth: Payer: Self-pay

## 2021-10-03 ENCOUNTER — Ambulatory Visit (INDEPENDENT_AMBULATORY_CARE_PROVIDER_SITE_OTHER): Payer: Medicare HMO | Admitting: Nurse Practitioner

## 2021-10-03 ENCOUNTER — Encounter: Payer: Self-pay | Admitting: Nurse Practitioner

## 2021-10-03 DIAGNOSIS — J9612 Chronic respiratory failure with hypercapnia: Secondary | ICD-10-CM | POA: Diagnosis not present

## 2021-10-03 DIAGNOSIS — C3492 Malignant neoplasm of unspecified part of left bronchus or lung: Secondary | ICD-10-CM

## 2021-10-03 DIAGNOSIS — J449 Chronic obstructive pulmonary disease, unspecified: Secondary | ICD-10-CM

## 2021-10-03 DIAGNOSIS — J9811 Atelectasis: Secondary | ICD-10-CM

## 2021-10-03 DIAGNOSIS — J9611 Chronic respiratory failure with hypoxia: Secondary | ICD-10-CM

## 2021-10-03 NOTE — Assessment & Plan Note (Signed)
Hospitalized in June for complete left lung atelectasis due to obstructing hilar mass. She has completed concurrent chemoradiation treatment and has had improvement in her respiratory status. Previous imaging from July showed a slight improvement in aeration of the lung. Lung exam is improved today as well. We will await her follow up CT chest next week for further evaluation - message sent to her oncology team as this has not been scheduled. She has been evaluated by Dr. Roxan Hockey - may be a candidate for VATS in future if proximal obstruction is relieved.   Patient Instructions  Ensure you are using Stiolto 2 puffs every day  Continue Albuterol inhaler 2 puffs or 3 mL neb every 6 hours as needed for shortness of breath or wheezing. Notify if symptoms persist despite rescue inhaler/neb use.  You need to wear continuous oxygen when you are up moving around at 4 lpm. Your POC machine is not adequate to keep your oxygen up right now. Ok to continue 3 lpm at rest and at night. Goal oxygen saturation >88-90%.   Attend CT chest as scheduled by your oncologist. Follow up with oncology as scheduled.    Follow up in 3 months with Dr. Melvyn Novas or Alanson Aly. If symptoms do not improve or worsen, please contact office for sooner follow up or seek emergency care.

## 2021-10-03 NOTE — Telephone Encounter (Signed)
I've spoken with the pt and confirmed she received the message left earlier and is aware of her appt. She has confirmed she did.

## 2021-10-03 NOTE — Assessment & Plan Note (Signed)
Severe COPD at baseline with FEV1 in 2017 39%. She does feel like her dyspnea has improved since she was hospitalized earlier this summer but still gets winded with minimal activity. She has not been using her Stiolto regularly. Re-educated on importance of maintenance therapy. We also reviewed proper inhaler method. Continue PRN albuterol.

## 2021-10-03 NOTE — Patient Instructions (Addendum)
Ensure you are using Stiolto 2 puffs every day  Continue Albuterol inhaler 2 puffs or 3 mL neb every 6 hours as needed for shortness of breath or wheezing. Notify if symptoms persist despite rescue inhaler/neb use.  You need to wear continuous oxygen when you are up moving around at 4 lpm. Your POC machine is not adequate to keep your oxygen up right now. Ok to continue 3 lpm at rest and at night. Goal oxygen saturation >88-90%.   Attend CT chest as scheduled by your oncologist. Follow up with oncology as scheduled.    Follow up in 3 months with Dr. Melvyn Novas or Alanson Aly. If symptoms do not improve or worsen, please contact office for sooner follow up or seek emergency care.

## 2021-10-03 NOTE — Telephone Encounter (Signed)
I have scheduled pts CT Chest for her. I have attempted to call the pt to advise of her appt information. I have left a message for the pt with the following details:  Saturday October 06, 2021 with an arrival time of 1:45pm. Nothing to eat after 10am but can have liquids. Lastly, if she cannot attend this appt for any reason, she can call RAD scheduling at [phone], opt 3 to adjust her appt.

## 2021-10-03 NOTE — Assessment & Plan Note (Signed)
She arrived on POC; able to maintain sats >88% at rest; however, she is still unable to maintain saturations with exertion on pulsed O2 and required 4 lpm continuous. We will again send order for smaller portable oxygen tanks. She will continue 3 lpm at rest and at night. Goal >88-90%. Discussed that we could re-evaluate use of POC at her follow up appointment. CO2 on recent CMETs have significantly improved; although, still mildly elevated. No significant symptoms of hypercarbia. Continue to monitor if she will need BiPAP/NIV at night in future.

## 2021-10-03 NOTE — Assessment & Plan Note (Addendum)
She has completed chemo and radiation earlier this month. Tolerated treatment well. CT chest anticipated next week to assess response to therapy and status of left lung. Follow up with oncology as scheduled.

## 2021-10-04 NOTE — Progress Notes (Signed)
[place] OFFICE PROGRESS NOTE  [name], NP [place] [address]  DIAGNOSIS: Stage II/IIIa (T3, N0/N2, M0) non-small cell lung cancer, squamous cell carcinoma presented with left hilar mass with suspicious mediastinal lymphadenopathy on the last CT scan of the chest at [place].  PRIOR THERAPY: A course of concurrent chemoradiation with weekly carboplatin for AUC of 2 and paclitaxel 45 Mg/M2.  Status post 6 cycles.  Last dose 09/17/2021  CURRENT THERAPY: Consolidation immunotherapy with Imfinzi 1500 mg IV every 4 weeks.  First dose expected next week on 10/18/2021.  INTERVAL HISTORY: [name] 76 y.o. female returns to the clinic today for a follow-up visit accompanied by her husband.  The patient was last seen in the clinic by myself on 09/10/2021.  The patient completed her treatment with concurrent chemoradiation on 09/17/2021.  She tolerated this overall well with only mild odynophagia and dysphagia.  Her appetite is good and her weight is stable.  The patient denies any recent fever, chills, or night sweats.  She does occasionally get "hot flashes".  She reports her baseline dyspnea on exertion for which she is on supplemental home oxygen.  She thinks she has been coughing more lately secondary to sinus drainage.  She uses Flonase and takes Tylenol Sinus at nighttime. She denies any hemoptysis or chest pain.  Denies any nausea, vomiting, diarrhea, or constipation. She denies any abdominal pain. Denies any headache or visual changes.  The patient recently had a restaging CT scan performed.  She is here today for evaluation to review her scan results and for a more detailed discussion about the next steps in her treatment options.     MEDICAL HISTORY: Past Medical History:  Diagnosis Date   CAD (coronary artery disease)    Cancer ([place])    COPD (chronic obstructive pulmonary disease) ([place])    per DM note, pt denies   Dyslipidemia     GERD (gastroesophageal reflux disease)    History of radiation therapy    Left lung- 01/16/21-01/30/21- Dr. Gery Pray   HTN (hypertension)    Hypothyroidism    Myocardial infarct [facility])    Non-small cell lung cancer ([place]) 08/19/2010   Stage IA, status post left upper lobectomy July 2012   Tobacco abuse     ALLERGIES:  is allergic to codeine.  MEDICATIONS:  Current Outpatient Medications  Medication Sig Dispense Refill   acetaminophen (TYLENOL) 500 MG tablet Take 500 mg by mouth every 6 (six) hours as needed for moderate pain or fever.     albuterol (PROAIR HFA) 108 (90 Base) MCG/ACT inhaler 2 puffs up to every 4 hours as needed only  if your can't catch your breath (Patient taking differently: Inhale 2 puffs into the lungs every 4 (four) hours as needed for shortness of breath.)     albuterol (PROVENTIL) (2.5 MG/3ML) 0.083% nebulizer solution Take 2.5 mg by nebulization 3 (three) times daily as needed for wheezing or shortness of breath.     amiodarone (PACERONE) 100 MG tablet Take 1 tablet (100 mg total) by mouth daily. 30 tablet 6   aspirin 81 MG tablet Take 81 mg by mouth at bedtime.     Coenzyme Q10 (CO Q 10 PO) Take 1 capsule by mouth daily.     furosemide (LASIX) 20 MG tablet Take 1 tablet po BID for 5 days then go back to taking 1 tablet po QD 90 tablet 1   levothyroxine (SYNTHROID) 25  MCG tablet TAKE 0.5 TABLETS (12.5 MCG TOTAL) BY MOUTH DAILY. IN ADDITION TO THE 50 MCG. (Patient taking differently: Take 12.5 mcg by mouth See admin instructions. 12.5 mcg daily except on sunday) 45 tablet 1   levothyroxine (SYNTHROID) 50 MCG tablet TAKE 1 TABLET BY MOUTH EVERY DAY BEFORE BREAKFAST (Patient taking differently: Take 50 mcg by mouth See admin instructions. Along with 12.5 mcg qd except on Sunday (pt just takes 50 mcg)) 90 tablet 3   metoprolol succinate (TOPROL-XL) 100 MG 24 hr tablet Take 1 tablet (100 mg total) by mouth daily. Take with or immediately following a meal. 90 tablet 1    naproxen sodium (ALEVE) 220 MG tablet Take 220 mg by mouth as needed.     Omega-3 Fatty Acids (OMEGA-3 PO) Take 1,000 mg by mouth in the morning and at bedtime.     OXYGEN Inhale 3.5 L into the lungs continuous.     Phenylephrine-Acetaminophen (TYLENOL SINUS CONGESTION/PAIN PO) Take by mouth daily as needed.     rosuvastatin (CRESTOR) 40 MG tablet Take 1 tablet (40 mg total) by mouth daily. 90 tablet 3   Tiotropium Bromide-Olodaterol (STIOLTO RESPIMAT) 2.5-2.5 MCG/ACT AERS Inhale 2 puffs into the lungs daily. 4 g 0   lidocaine-prilocaine (EMLA) cream Apply 1 Application topically as needed. (Patient not taking: Reported on 10/11/2021) 30 g 2   prochlorperazine (COMPAZINE) 10 MG tablet Take 1 tablet (10 mg total) by mouth every 6 (six) hours as needed. (Patient not taking: Reported on 10/11/2021) 30 tablet 2   Tiotropium Bromide-Olodaterol (STIOLTO RESPIMAT) 2.5-2.5 MCG/ACT AERS Inhale 2 puffs into the lungs daily. (Patient not taking: Reported on 10/11/2021) 4 g 5   No current facility-administered medications for this visit.    SURGICAL HISTORY:  Past Surgical History:  Procedure Laterality Date   CARDIAC CATHETERIZATION  07/06/2010   see CABG report - pt sent to OR   CARDIOVASCULAR STRESS TEST  09/11/2010   R/S MV - normal perfusion in all regions, EF 46%, no scintigraphic evidence of inducible myocardial ischemia; global LV systolic function mildly reduced; no significant wall motion abnormalities noted; Exercise capacity 7 METS; EKG negative for ischemia; low risk study, no signifcant change from previous study 08/2003   CORONARY ARTERY BYPASS GRAFT  07/11/2010   LIMA to LAD; SVG to 2nd branch OM; SVG to posterior descending artery   IR IMAGING GUIDED PORT INSERTION  08/16/2021   LEFT VATS  09/17/2010   TEE WITHOUT CARDIOVERSION  07/06/2010   during emergent CABG surgery; 2-3+ mitral regurgitation   VIDEO BRONCHOSCOPY N/A 07/27/2021   Procedure: VIDEO BRONCHOSCOPY;  Surgeon: Melrose Nakayama, MD;  Location: [facility];  Service: Thoracic;  Laterality: N/A;   VIDEO BRONCHOSCOPY WITH ENDOBRONCHIAL ULTRASOUND N/A 07/27/2021   Procedure: VIDEO BRONCHOSCOPY WITH ENDOBRONCHIAL ULTRASOUND;  Surgeon: Melrose Nakayama, MD;  Location: [place] [facility];  Service: Thoracic;  Laterality: N/A;    REVIEW OF SYSTEMS:   Review of Systems  Constitutional: Positive for baseline fatigue. Negative for appetite change, chills, fever and unexpected weight change.  HENT: Negative for mouth sores, nosebleeds, sore throat and trouble swallowing.   Eyes: Negative for eye problems and icterus.  Respiratory: Positive for cough and shortness of breath.  Negative for hemoptysis and wheezing.  Cardiovascular: Negative for chest pain and leg swelling.  Gastrointestinal: Negative for abdominal pain, constipation, diarrhea, nausea and vomiting.  Genitourinary: Negative for bladder incontinence, difficulty urinating, dysuria, frequency and hematuria.   Musculoskeletal: Negative for back pain, gait problem,  neck pain and neck stiffness.  Skin: Negative for itching and rash.  Neurological: Negative for dizziness, extremity weakness, gait problem, headaches, light-headedness and seizures.  Hematological: Negative for adenopathy. Does not bruise/bleed easily.  Psychiatric/Behavioral: Negative for confusion, depression and sleep disturbance. The patient is not nervous/anxious.       PHYSICAL EXAMINATION:  Blood pressure (!) 116/55, pulse 81, temperature 97.9 F (36.6 C), temperature source Temporal, resp. rate 16, weight 210 lb 9.6 oz (95.5 kg), SpO2 95 %.  ECOG PERFORMANCE STATUS: 2    Physical Exam  Constitutional: Oriented to person, place, and time and well-developed, well-nourished, and in no distress.  HENT:  Head: Normocephalic and atraumatic.  Mouth/Throat: Oropharynx is clear and moist. No oropharyngeal exudate.  Eyes: Conjunctivae are normal. Right eye exhibits no discharge. Left eye exhibits no  discharge. No scleral icterus.  Neck: Normal range of motion. Neck supple.  Cardiovascular: Normal rate, regular rhythm, normal heart sounds and intact distal pulses.   Pulmonary/Chest: Effort normal. No respiratory distress. No wheezes. No rales.  On supplemental oxygen via nasal cannula.   Abdominal: Soft. Bowel sounds are normal. Exhibits no distension and no mass. There is no tenderness.  Musculoskeletal: Normal range of motion. Exhibits no edema.  Lymphadenopathy:    No cervical adenopathy.  Neurological: Alert and oriented to person, place, and time. Exhibits also wasting.  Examined in the wheelchair.  Skin: Skin is warm and dry. No rash noted. Not diaphoretic. No erythema. No pallor.  Psychiatric: Mood, memory and judgment normal.  Vitals reviewed.  LABORATORY DATA: Lab Results  Component Value Date   WBC 3.1 (L) 09/17/2021   HGB 9.1 (L) 09/17/2021   HCT 29.3 (L) 09/17/2021   MCV 87.7 09/17/2021   PLT 160 09/17/2021      Chemistry      Component Value Date/Time   NA 141 09/17/2021 1047   NA 143 05/02/2021 1055   K 3.7 09/17/2021 1047   CL 100 09/17/2021 1047   CO2 38 (H) 09/17/2021 1047   BUN 11 09/17/2021 1047   BUN 13 05/02/2021 1055   CREATININE 0.84 09/17/2021 1047   CREATININE 0.81 07/02/2016 1323      Component Value Date/Time   CALCIUM 8.9 09/17/2021 1047   ALKPHOS 62 09/17/2021 1047   AST 22 09/17/2021 1047   ALT 35 09/17/2021 1047   BILITOT 0.3 09/17/2021 1047       RADIOGRAPHIC STUDIES:  CT Chest W Contrast  Result Date: 10/09/2021 CLINICAL DATA:  76 year old female with history of non-small cell lung cancer. Evaluate for treatment response. * Tracking Code: BO * EXAM: CT CHEST WITH CONTRAST TECHNIQUE: Multidetector CT imaging of the chest was performed during intravenous contrast administration. RADIATION DOSE REDUCTION: This exam was performed according to the departmental dose-optimization program which includes automated exposure control,  adjustment of the mA and/or kV according to patient size and/or use of iterative reconstruction technique. CONTRAST:  70m OMNIPAQUE IOHEXOL 300 MG/ML  SOLN COMPARISON:  Chest CT 08/07/2021. FINDINGS: Cardiovascular: Heart size is normal. There is no significant pericardial fluid, thickening or pericardial calcification. There is aortic atherosclerosis, as well as atherosclerosis of the great vessels of the mediastinum and the coronary arteries, including calcified atherosclerotic plaque in the left main, left anterior descending, left circumflex and right coronary arteries. Status post median sternotomy for CABG including LIMA to the LAD. Right internal jugular single-lumen Port-A-Cath with tip terminating at the superior cavoatrial junction. Mediastinum/Nodes: Amorphous soft tissue in the left hilar region slightly less  pronounced than the prior examination, but likely reflective of residual mass and associated left hilar lymphadenopathy, best appreciated on axial image 62 of series 2 where this causes severe narrowing of multiple bronchi extending to the left lower lobe. No other pathologically enlarged mediastinal or hilar lymph nodes are noted on today's examination. Esophagus is unremarkable in appearance. No axillary lymphadenopathy. Lungs/Pleura: Compared to the prior study there has been interval re-expansion of much of the left lung. There is a new thick-walled cavitary area in the left upper lobe (axial image 50 of series 7), which is associated with a nodular area of architectural distortion along its inferior margin (axial image 55 of series 7) measuring 2.8 x 1.7 cm. Patchy areas of ground-glass attenuation, septal thickening and extensive architectural distortion and volume loss are noted in the left lung. Similar findings are present to a lesser extent in the right lung as well. Diffuse bronchial wall thickening with mild centrilobular and paraseptal emphysema. Small left pleural effusion appears  partially loculated and is likely malignant. No right pleural effusion. Upper Abdomen: Aortic atherosclerosis. Gallbladder wall appears thickened and edematous, with slightly irregular contours. No calcified gallstones are confidently identified, however, noncalcified stones are suspected. 12 mm high attenuation (69 HU) lesion in the upper pole of the left kidney (axial image 123 of series 2), incompletely characterized. Musculoskeletal: Median sternotomy wires. There are no aggressive appearing lytic or blastic lesions noted in the visualized portions of the skeleton. IMPRESSION: 1. Today's study demonstrates a positive response to therapy with some regression of the large infiltrative neoplasm in the left lung and re-expansion of the left lung. There is a new thick-walled cavitary lesion and left upper lobe nodule, along with persistent fullness in the left hilar region which likely represents residual malignant tissue and/or lymphadenopathy. Small partially loculated left pleural effusion has decreased in size compared to the prior study, but is likely malignant. 2. Abnormal appearance of the gallbladder, which could suggest the presence of indwelling stones. Gallbladder wall appears slightly thickened and edematous. If there is any clinical concern for acute cholecystitis or other gallbladder pathology, further evaluation with right upper quadrant abdominal ultrasound should be considered at this time. 3. Aortic atherosclerosis, in addition to left main and three-vessel coronary artery disease. Status post median sternotomy for CABG including LIMA to the LAD. 4. Mild diffuse bronchial wall thickening with mild centrilobular and paraseptal emphysema; imaging findings suggestive of underlying COPD. 5. Additional incidental findings, as above. Aortic Atherosclerosis (ICD10-I70.0) and Emphysema (ICD10-J43.9). Electronically Signed   By: Vinnie Langton M.D.   On: 10/09/2021 06:26     ASSESSMENT/PLAN:  This is a  very pleasant 76 year old Caucasian female with stage IIb/IIIa (T3, and 0/N2, M0) non-small cell lung cancer, squamous of carcinoma.  She presented with a left hilar mass with suspicious mediastinal lymphadenopathy.  The patient also has a history of stage Ia non-small cell lung cancer, adenocarcinoma status post surgical resection in July 2012 as well as recurrent stage Ia non-small cell lung cancer, squamous cell carcinoma status post SBRT which was completed on 01/30/2021.   The patient completed a course of concurrent chemoradiation with weekly carboplatin for an AUC of 2 and paclitaxel 45 mg per metered square.  The patient is status post 6 cycles.  Her last dose of treatment was on 09/17/2021.  The patient recently had a restaging CT scan performed.  Dr. Julien Nordmann personally and independently reviewed the scan discussed results with the patient today.  The scan showed positive response to  therapy with regression of the large infiltrative neoplasm in the left lung and re-expansion of the left lung. There is a new thick-walled cavitary lesion and left upper lobe nodule, along with persistent fullness in the left hilar region which likely represents residual malignant tissue and/or lymphadenopathy  Dr. Julien Nordmann had a lengthy discussion with the patient today about her current condition and recommended next steps.  Dr. Julien Nordmann recommended consolidation immunotherapy with Imfinzi 1500 mg IV every 4 weeks.  The patient is interested in this option and she is expected to start her first dose of treatment next week on 10/18/2021.  I discussed with her the adverse effect of the immunotherapy including but not limited to immunotherapy mediated skin rash, diarrhea, inflammation of the lung, kidney, liver, thyroid or other endocrine dysfunction  We will see her back for follow-up visit in 5 weeks for evaluation and repeat blood work before starting cycle #2.  The patient scan incidentally noted abnormal  appearance of the gallbladder which could be indwelling stones. The scan noted that that the gallbladder wall appears slightly thickened and edematous.  The patient denies any fevers, abdominal pain, nausea, vomiting, or changes in her bowel habits.  The patient denies any radiating pain to her right shoulder.  Advised the patient if she develops any of the symptoms that we would recommend emergency room evaluation.  The patient was advised to call immediately if she has any concerning symptoms in the interval. The patient voices understanding of current disease status and treatment options and is in agreement with the current care plan. All questions were answered. The patient knows to call the clinic with any problems, questions or concerns. We can certainly see the patient much sooner if necessary        No orders of the defined types were placed in this encounter.     [name] L Ilyssa Grennan, PA-C 10/11/21  ADDENDUM: Hematology/Oncology Attending: I had a face-to-face encounter with the patient today.  I reviewed her records, lab, scan and recommended her care plan.  This is a very pleasant 76 years old white female with stage IIIb (T3, N2, M0) non-small cell lung cancer, squamous cell carcinoma presented with left hilar mass and suspicious mediastinal lymphadenopathy diagnosed in June 2023 with negative PD-L1 expression. The patient underwent a course of concurrent chemoradiation with weekly carboplatin and paclitaxel status post 6 cycles last dose was given September 17, 2021. She had repeat CT scan of the chest performed recently.  I personally and independently reviewed the scan images and discussed the result and showed the images to the patient and her husband. Her scan showed significant improvement in her disease especially in the obstructive left hilar mass. I discussed with the patient her treatment options and gave her the option of continuous observation and monitoring versus  proceeding with consolidation treatment with immunotherapy with Imfinzi 1500 Mg IV every 4 weeks for a total of 1 year unless the patient has unacceptable toxicity or disease progression. The patient is interested in the immunotherapy and I discussed with her the adverse effect of this treatment including but not limited to immunotherapy mediated skin rash, diarrhea, inflammation of the lung, kidney, liver, thyroid or other endocrine dysfunction. She is expected to start the first cycle of this treatment next week. I will see her back for follow-up visit in 5 weeks with the start of cycle #2. The patient was advised to call immediately if she has any other concerning symptoms in the interval. The total time spent  in the appointment was 30 minutes. Disclaimer: This note was dictated with voice recognition software. Similar sounding words can inadvertently be transcribed and may be missed upon review. Eilleen Kempf, MD

## 2021-10-06 ENCOUNTER — Ambulatory Visit (HOSPITAL_COMMUNITY)
Admission: RE | Admit: 2021-10-06 | Discharge: 2021-10-06 | Disposition: A | Discharge status: Home / Self Care | Payer: Medicare HMO | Source: Ambulatory Visit | Attending: Physician Assistant | Admitting: Physician Assistant

## 2021-10-06 DIAGNOSIS — J432 Centrilobular emphysema: Secondary | ICD-10-CM | POA: Diagnosis not present

## 2021-10-06 DIAGNOSIS — J90 Pleural effusion, not elsewhere classified: Secondary | ICD-10-CM | POA: Diagnosis not present

## 2021-10-06 DIAGNOSIS — C3412 Malignant neoplasm of upper lobe, left bronchus or lung: Secondary | ICD-10-CM | POA: Insufficient documentation

## 2021-10-06 MED ORDER — HEPARIN SOD (PORK) LOCK FLUSH 100 UNIT/ML IV SOLN
INTRAVENOUS | Status: AC
  Filled 2021-10-06: qty 5

## 2021-10-06 MED ORDER — IOHEXOL 300 MG/ML  SOLN
75.0000 mL | Freq: Once | INTRAMUSCULAR | Status: AC | PRN
  Administered 2021-10-06: 75 mL via INTRAVENOUS

## 2021-10-06 MED ORDER — HEPARIN SOD (PORK) LOCK FLUSH 100 UNIT/ML IV SOLN
500.0000 [IU] | Freq: Once | INTRAVENOUS | Status: AC
  Administered 2021-10-06: 500 [IU] via INTRAVENOUS

## 2021-10-09 ENCOUNTER — Encounter: Payer: Self-pay | Admitting: Nurse Practitioner

## 2021-10-10 ENCOUNTER — Other Ambulatory Visit: Payer: Self-pay

## 2021-10-10 DIAGNOSIS — I10 Essential (primary) hypertension: Secondary | ICD-10-CM

## 2021-10-10 MED ORDER — METOPROLOL SUCCINATE ER 100 MG PO TB24: 100.0000 mg | ORAL_TABLET | Freq: Every day | ORAL | 1 refills | Status: DC

## 2021-10-11 ENCOUNTER — Encounter: Payer: Self-pay | Admitting: Physician Assistant

## 2021-10-11 ENCOUNTER — Other Ambulatory Visit: Payer: Self-pay

## 2021-10-11 ENCOUNTER — Inpatient Hospital Stay: Payer: Medicare HMO | Attending: Internal Medicine | Admitting: Physician Assistant

## 2021-10-11 VITALS — BP 116/55 | HR 81 | Temp 97.9°F | Resp 16 | Wt 210.6 lb

## 2021-10-11 DIAGNOSIS — Z923 Personal history of irradiation: Secondary | ICD-10-CM | POA: Insufficient documentation

## 2021-10-11 DIAGNOSIS — C3490 Malignant neoplasm of unspecified part of unspecified bronchus or lung: Secondary | ICD-10-CM

## 2021-10-11 DIAGNOSIS — I10 Essential (primary) hypertension: Secondary | ICD-10-CM | POA: Diagnosis not present

## 2021-10-11 DIAGNOSIS — C3492 Malignant neoplasm of unspecified part of left bronchus or lung: Secondary | ICD-10-CM | POA: Insufficient documentation

## 2021-10-11 DIAGNOSIS — Z79899 Other long term (current) drug therapy: Secondary | ICD-10-CM | POA: Diagnosis not present

## 2021-10-11 DIAGNOSIS — Z7189 Other specified counseling: Secondary | ICD-10-CM | POA: Diagnosis not present

## 2021-10-11 DIAGNOSIS — J449 Chronic obstructive pulmonary disease, unspecified: Secondary | ICD-10-CM | POA: Diagnosis not present

## 2021-10-11 DIAGNOSIS — Z5112 Encounter for antineoplastic immunotherapy: Secondary | ICD-10-CM | POA: Insufficient documentation

## 2021-10-11 NOTE — Patient Instructions (Signed)
-  We covered a lot of important information at your appointment today regarding what the treatment plan is moving forward. Here are the the main points that were discussed at your office visit with [place] today:  -The treatment will consist of a new medication. This is not chemotherapy. This new drug is a type of Immunotherapy called Imfinzi (Durvalumab).  -We are planning on starting your treatment next week on 10/18/21  -Your treatment will be given once every 4 weeks. You will receive this treatment every four weeks for a total of 1 year (13 total treatments) unless you experience unacceptable toxicity or if there is evidence on your routine CT scans that the cancer is growing  -We will get a CT scan after every 3 treatments to check on the progress of treatment  Side Effects:  -The adverse effect of the immunotherapy including but not limited to immunotherapy mediated skin rash, diarrhea, inflammation of the lung, kidney, liver, thyroid or other endocrine dysfunction  Follow up:  -We will see you back for a follow up visit in 5 weeks with the second round of this treatment.

## 2021-10-11 NOTE — Progress Notes (Signed)
DISCONTINUE ON PATHWAY REGIMEN - Non-Small Cell Lung     Administer weekly:     Paclitaxel      Carboplatin   **Always confirm dose/schedule in your pharmacy ordering system**  REASON: Other Reason PRIOR TREATMENT: [id number]: Carboplatin AUC=2 + Paclitaxel 45 mg/m2 Weekly During Radiation TREATMENT RESPONSE: Partial Response (PR)  START ON PATHWAY REGIMEN - Non-Small Cell Lung     A cycle is every 28 days:     Durvalumab   **Always confirm dose/schedule in your pharmacy ordering system**  Patient Characteristics: Preoperative or Nonsurgical Candidate (Clinical Staging), Stage III - Nonsurgical Candidate (Nonsquamous and Squamous), PS = 0, 1 Therapeutic Status: Preoperative or Nonsurgical Candidate (Clinical Staging) AJCC T Category: cT3 AJCC N Category: cN2 AJCC M Category: cM0 AJCC 8 Stage Grouping: IIIB ECOG Performance Status: 1 Intent of Therapy: Curative Intent, Discussed with Patient

## 2021-10-12 ENCOUNTER — Telehealth: Payer: Self-pay | Admitting: Internal Medicine

## 2021-10-12 NOTE — Telephone Encounter (Signed)
Scheduled per 08/24 work-queue list, patient has been called and notified.

## 2021-10-15 ENCOUNTER — Other Ambulatory Visit: Payer: Self-pay | Admitting: Nurse Practitioner

## 2021-10-15 DIAGNOSIS — J449 Chronic obstructive pulmonary disease, unspecified: Secondary | ICD-10-CM

## 2021-10-16 ENCOUNTER — Telehealth: Payer: Self-pay | Admitting: Nurse Practitioner

## 2021-10-16 DIAGNOSIS — J449 Chronic obstructive pulmonary disease, unspecified: Secondary | ICD-10-CM

## 2021-10-18 ENCOUNTER — Other Ambulatory Visit: Payer: Self-pay

## 2021-10-18 ENCOUNTER — Inpatient Hospital Stay: Payer: Medicare HMO

## 2021-10-18 VITALS — BP 116/72 | HR 71 | Temp 98.8°F | Resp 19

## 2021-10-18 DIAGNOSIS — C3492 Malignant neoplasm of unspecified part of left bronchus or lung: Secondary | ICD-10-CM | POA: Diagnosis not present

## 2021-10-18 DIAGNOSIS — Z95828 Presence of other vascular implants and grafts: Secondary | ICD-10-CM

## 2021-10-18 DIAGNOSIS — Z923 Personal history of irradiation: Secondary | ICD-10-CM | POA: Diagnosis not present

## 2021-10-18 DIAGNOSIS — J449 Chronic obstructive pulmonary disease, unspecified: Secondary | ICD-10-CM | POA: Diagnosis not present

## 2021-10-18 DIAGNOSIS — Z5112 Encounter for antineoplastic immunotherapy: Secondary | ICD-10-CM | POA: Diagnosis not present

## 2021-10-18 DIAGNOSIS — I10 Essential (primary) hypertension: Secondary | ICD-10-CM | POA: Diagnosis not present

## 2021-10-18 DIAGNOSIS — Z79899 Other long term (current) drug therapy: Secondary | ICD-10-CM | POA: Diagnosis not present

## 2021-10-18 LAB — CMP (CANCER CENTER ONLY)
ALT: 20 U/L (ref 0–44)
AST: 23 U/L (ref 15–41)
Albumin: 3.7 g/dL (ref 3.5–5.0)
Alkaline Phosphatase: 65 U/L (ref 38–126)
Anion gap: 3 — ABNORMAL LOW (ref 5–15)
BUN: 10 mg/dL (ref 8–23)
CO2: 36 mmol/L — ABNORMAL HIGH (ref 22–32)
Calcium: 9.4 mg/dL (ref 8.9–10.3)
Chloride: 103 mmol/L (ref 98–111)
Creatinine: 0.71 mg/dL (ref 0.44–1.00)
GFR, Estimated: >60 mL/min (ref >60)
Glucose, Bld: 108 mg/dL — ABNORMAL HIGH (ref 70–99)
Potassium: 3.8 mmol/L (ref 3.5–5.1)
Sodium: 142 mmol/L (ref 135–145)
Total Bilirubin: 0.3 mg/dL (ref 0.3–1.2)
Total Protein: 6.7 g/dL (ref 6.5–8.1)

## 2021-10-18 LAB — CBC WITH DIFFERENTIAL (CANCER CENTER ONLY)
Abs Immature Granulocytes: 0.07 10*3/uL (ref 0.00–0.07)
Basophils Absolute: 0.1 10*3/uL (ref 0.0–0.1)
Basophils Relative: 1 %
Eosinophils Absolute: 0.2 10*3/uL (ref 0.0–0.5)
Eosinophils Relative: 2 %
HCT: 31 % — ABNORMAL LOW (ref 36.0–46.0)
Hemoglobin: 9.5 g/dL — ABNORMAL LOW (ref 12.0–15.0)
Immature Granulocytes: 1 %
Lymphocytes Relative: 17 %
Lymphs Abs: 1.2 10*3/uL (ref 0.7–4.0)
MCH: 29 pg (ref 26.0–34.0)
MCHC: 30.6 g/dL (ref 30.0–36.0)
MCV: 94.5 fL (ref 80.0–100.0)
Monocytes Absolute: 1 10*3/uL (ref 0.1–1.0)
Monocytes Relative: 14 %
Neutro Abs: 4.5 10*3/uL (ref 1.7–7.7)
Neutrophils Relative %: 65 %
Platelet Count: 241 10*3/uL (ref 150–400)
RBC: 3.28 MIL/uL — ABNORMAL LOW (ref 3.87–5.11)
RDW: 22.6 % — ABNORMAL HIGH (ref 11.5–15.5)
WBC Count: 6.9 10*3/uL (ref 4.0–10.5)
nRBC: 0 % (ref 0.0–0.2)

## 2021-10-18 LAB — TSH: TSH: 4.23 u[IU]/mL (ref 0.350–4.500)

## 2021-10-18 MED ORDER — SODIUM CHLORIDE 0.9% FLUSH
10.0000 mL | Freq: Once | INTRAVENOUS | Status: AC
  Administered 2021-10-18: 10 mL

## 2021-10-18 MED ORDER — HEPARIN SOD (PORK) LOCK FLUSH 100 UNIT/ML IV SOLN
500.0000 [IU] | Freq: Once | INTRAVENOUS | Status: AC | PRN
  Administered 2021-10-18: 500 [IU]

## 2021-10-18 MED ORDER — SODIUM CHLORIDE 0.9% FLUSH
10.0000 mL | INTRAVENOUS | Status: DC | PRN
  Administered 2021-10-18: 10 mL

## 2021-10-18 MED ORDER — BEVESPI AEROSPHERE 9-4.8 MCG/ACT IN AERO: 2.0000 | INHALATION_SPRAY | Freq: Two times a day (BID) | RESPIRATORY_TRACT | 5 refills | Status: DC

## 2021-10-18 MED ORDER — SODIUM CHLORIDE 0.9 % IV SOLN
1500.0000 mg | Freq: Once | INTRAVENOUS | Status: AC
  Administered 2021-10-18: 1500 mg via INTRAVENOUS
  Filled 2021-10-18: qty 30

## 2021-10-18 MED ORDER — SODIUM CHLORIDE 0.9 % IV SOLN: Freq: Once | INTRAVENOUS | Status: AC

## 2021-10-18 NOTE — Telephone Encounter (Signed)
Stop Stiolto once inhaler is empty. Start Bevespi 2 puffs Twice daily. Rx sent to her pharmacy. Thanks.

## 2021-10-18 NOTE — Telephone Encounter (Signed)
Katie, please advise on this if you want to send in one of the covered alternatives to pharmacy for pt.

## 2021-10-18 NOTE — Patient Instructions (Signed)

## 2021-10-18 NOTE — Patient Instructions (Signed)
[facility]  Discharge Instructions: Thank you for choosing [facility] to provide your oncology and hematology care.   If you have a lab appointment with the [facility], please go directly to the [facility] and check in at the registration area.   Wear comfortable clothing and clothing appropriate for easy access to any Portacath or PICC line.   We strive to give you quality time with your provider. You may need to reschedule your appointment if you arrive late (15 or more minutes).  Arriving late affects you and other patients whose appointments are after yours.  Also, if you miss three or more appointments without notifying the office, you may be dismissed from the clinic at the provider's discretion.      For prescription refill requests, have your pharmacy contact our office and allow 72 hours for refills to be completed.    Today you received the following chemotherapy and/or immunotherapy agents: Durvalumab      To help prevent nausea and vomiting after your treatment, we encourage you to take your nausea medication as directed.  BELOW ARE SYMPTOMS THAT SHOULD BE REPORTED IMMEDIATELY: *FEVER GREATER THAN 100.4 F (38 C) OR HIGHER *CHILLS OR SWEATING *NAUSEA AND VOMITING THAT IS NOT CONTROLLED WITH YOUR NAUSEA MEDICATION *UNUSUAL SHORTNESS OF BREATH *UNUSUAL BRUISING OR BLEEDING *URINARY PROBLEMS (pain or burning when urinating, or frequent urination) *BOWEL PROBLEMS (unusual diarrhea, constipation, pain near the anus) TENDERNESS IN MOUTH AND THROAT WITH OR WITHOUT PRESENCE OF ULCERS (sore throat, sores in mouth, or a toothache) UNUSUAL RASH, SWELLING OR PAIN  UNUSUAL VAGINAL DISCHARGE OR ITCHING   Items with * indicate a potential emergency and should be followed up as soon as possible or go to the Emergency Department if any problems should occur.  Please show the CHEMOTHERAPY ALERT CARD or IMMUNOTHERAPY ALERT CARD at check-in to  the Emergency Department and triage nurse.  Should you have questions after your visit or need to cancel or reschedule your appointment, please contact [facility]  Dept: [phone]  and follow the prompts.  Office hours are 8:00 a.m. to 4:30 p.m. Monday - Friday. Please note that voicemails left after 4:00 p.m. may not be returned until the following business day.  We are closed weekends and major holidays. You have access to a nurse at all times for urgent questions. Please call the main number to the clinic Dept: [phone] and follow the prompts.   For any non-urgent questions, you may also contact your provider using MyChart. We now offer e-Visits for anyone 53 and older to request care online for non-urgent symptoms. For details visit [email or web address].   Also download the MyChart app! Go to the app store, search "MyChart", open the app, select [facility], and log in with your MyChart username and password.  Masks are optional in the cancer centers. If you would like for your care team to wear a mask while they are taking care of you, please let them know. You may have one support person who is at least 75 years old accompany you for your appointments.  Durvalumab Injection What is this medication? DURVALUMAB (dur VAL ue mab) treats some types of cancer. It works by helping your immune system slow or stop the spread of cancer cells. It is a monoclonal antibody. This medicine may be used for other purposes; ask your health care provider or pharmacist if you have questions. COMMON BRAND NAME(S): IMFINZI What should  I tell my care team before I take this medication? They need to know if you have any of these conditions: Allogeneic stem cell transplant (uses someone else's stem cells) Autoimmune diseases, such as Crohn disease, ulcerative colitis, lupus History of chest radiation Nervous system problems, such as Guillain-Barre syndrome, myasthenia  gravis Organ transplant An unusual or allergic reaction to durvalumab, other medications, foods, dyes, or preservatives Pregnant or trying to get pregnant Breast-feeding How should I use this medication? This medication is infused into a vein. It is given by your care team in a hospital or clinic setting. A special MedGuide will be given to you before each treatment. Be sure to read this information carefully each time. Talk to your care team about the use of this medication in children. Special care may be needed. Overdosage: If you think you have taken too much of this medicine contact a poison control center or emergency room at once. NOTE: This medicine is only for you. Do not share this medicine with others. What if I miss a dose? Keep appointments for follow-up doses. It is important not to miss your dose. Call your care team if you are unable to keep an appointment. What may interact with this medication? Interactions have not been studied. This list may not describe all possible interactions. Give your health care provider a list of all the medicines, herbs, non-prescription drugs, or dietary supplements you use. Also tell them if you smoke, drink alcohol, or use illegal drugs. Some items may interact with your medicine. What should I watch for while using this medication? Your condition will be monitored carefully while you are receiving this medication. You may need blood work while taking this medication. This medication may cause serious skin reactions. They can happen weeks to months after starting the medication. Contact your care team right away if you notice fevers or flu-like symptoms with a rash. The rash may be red or purple and then turn into blisters or peeling of the skin. You may also notice a red rash with swelling of the face, lips, or lymph nodes in your neck or under your arms. Tell your care team right away if you have any change in your eyesight. Talk to your care  team if you may be pregnant. Serious birth defects can occur if you take this medication during pregnancy and for 3 months after the last dose. You will need a negative pregnancy test before starting this medication. Contraception is recommended while taking this medication and for 3 months after the last dose. Your care team can help you find the option that works for you. Do not breastfeed while taking this medication and for 3 months after the last dose. What side effects may I notice from receiving this medication? Side effects that you should report to your care team as soon as possible: Allergic reactions--skin rash, itching, hives, swelling of the face, lips, tongue, or throat Dry cough, shortness of breath or trouble breathing Eye pain, redness, irritation, or discharge with blurry or decreased vision Heart muscle inflammation--unusual weakness or fatigue, shortness of breath, chest pain, fast or irregular heartbeat, dizziness, swelling of the ankles, feet, or hands Hormone gland problems--headache, sensitivity to light, unusual weakness or fatigue, dizziness, fast or irregular heartbeat, increased sensitivity to cold or heat, excessive sweating, constipation, hair loss, increased thirst or amount of urine, tremors or shaking, irritability Infusion reactions--chest pain, shortness of breath or trouble breathing, feeling faint or lightheaded Kidney injury (glomerulonephritis)--decrease in the amount of  urine, red or dark brown urine, foamy or bubbly urine, swelling of the ankles, hands, or feet Liver injury--right upper belly pain, loss of appetite, nausea, light-colored stool, dark yellow or brown urine, yellowing skin or eyes, unusual weakness or fatigue Pain, tingling, or numbness in the hands or feet, muscle weakness, change in vision, confusion or trouble speaking, loss of balance or coordination, trouble walking, seizures Rash, fever, and swollen lymph nodes Redness, blistering, peeling,  or loosening of the skin, including inside the mouth Sudden or severe stomach pain, bloody diarrhea, fever, nausea, vomiting Side effects that usually do not require medical attention (report these to your care team if they continue or are bothersome): Bone, joint, or muscle pain Diarrhea Fatigue Loss of appetite Nausea Skin rash This list may not describe all possible side effects. Call your doctor for medical advice about side effects. You may report side effects to FDA at 1-800-FDA-1088. Where should I keep my medication? This medication is given in a hospital or clinic. It will not be stored at home. NOTE: This sheet is a summary. It may not cover all possible information. If you have questions about this medicine, talk to your doctor, pharmacist, or health care provider.  2023 Elsevier/Gold Standard (2020-07-26 00:00:00)

## 2021-10-19 ENCOUNTER — Telehealth: Payer: Self-pay | Admitting: *Deleted

## 2021-10-19 LAB — T4: T4, Total: 9.6 ug/dL (ref 4.5–12.0)

## 2021-10-19 NOTE — Telephone Encounter (Signed)
Called pt to see how she did with her recent treatment & she reports doing well.  She denies any problems & reports knowing how to reach [place] if needed.

## 2021-10-19 NOTE — Telephone Encounter (Signed)
-----   Message from Charleston Poot, RN sent at 10/18/2021  3:37 PM EDT ----- Regarding: First time/ Durvalumab/ Dr Julien Nordmann pt Hello,  Pt had first time durvalumab today. Tolerated treatment well.  Thanks, Libbie K.

## 2021-10-19 NOTE — Telephone Encounter (Signed)
Called and spoke with pt letting her know that [facility] sent Rx for Bevespi to pharmacy for her and she verbalized understanding. Nothing further needed.

## 2021-10-24 ENCOUNTER — Encounter: Payer: Self-pay | Admitting: Radiation Oncology

## 2021-10-24 ENCOUNTER — Other Ambulatory Visit: Payer: Self-pay

## 2021-10-24 ENCOUNTER — Telehealth: Payer: Self-pay | Admitting: Nurse Practitioner

## 2021-10-24 DIAGNOSIS — R600 Localized edema: Secondary | ICD-10-CM

## 2021-10-24 MED ORDER — FUROSEMIDE 20 MG PO TABS: ORAL_TABLET | ORAL | 1 refills | Status: DC

## 2021-10-24 NOTE — Telephone Encounter (Signed)
Rx was sent to pharmacy. 

## 2021-10-24 NOTE — Telephone Encounter (Signed)
Patient requesting refill of lasix. Please advise.

## 2021-10-26 DIAGNOSIS — J9601 Acute respiratory failure with hypoxia: Secondary | ICD-10-CM | POA: Diagnosis not present

## 2021-10-26 NOTE — Progress Notes (Incomplete)
  Radiation Oncology         [phone] ________________________________  Patient Name: [name] MRN: [id number] DOB: Sep 01, 1945 Referring Physician: Modesto Charon (Profile Not Attached) Date of Service: 09/18/2021 [facility], [place]                                                        End Of Treatment Note  Diagnoses: C34.12-Malignant neoplasm of upper lobe, left bronchus or lung  Cancer Staging: The encounter diagnosis was Non-small cell cancer of left lung ([place]).   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 On the date of her final treatment, the patient reported fatigue, peeling and redness to her left upper back, and occasional cough (on 3L O2). She denied any significant swallowing issues.  Physical exam performed on the date of her final treatment revealed some mild wheezing to auscultation in the left lung with good air movement throughout.  Examination of the back area revealed hyperpigmentation changes at the site of her radiation portal.  A small area of skin breakdown superiorly was also noted for which we recommend antibiotic ointment.    Plan: The patient will follow-up with radiation oncology in one month .  ________________________________________________ -----------------------------------  [name], PhD, MD  This document serves as a record of services personally  performed by Gery Pray, MD. It was created on his behalf by Roney Mans, a trained medical scribe. The creation of this record is based on the scribe's personal observations and the provider's statements to them. This document has been checked and approved by the attending provider.

## 2021-10-28 NOTE — Progress Notes (Signed)
Radiation Oncology         [phone] ________________________________  Name: [name] MRN: [id number]  Date: 10/29/2021  DOB: 1945-04-10  Follow-Up Visit Note  CC: Megan Freshwater, NP  Melrose Nakayama, *  No diagnosis found.  Diagnosis:   The encounter diagnosis was Non-small cell cancer of left lung ([place]).   Invasive moderately differentiated squamous cell carcinoma of the LLL, p63 positive   PET positive pulmonary nodule presenting in the left lower lobe, clinical stage IA2 (T1b, N0, M0)   (30mm subpleural nodule in the left lower lobe, suspicious for primary bronchogenic carcinoma)    Biopsy proven non-small cell lung cancer in the left hilar region  Interval Since Last Radiation: 1 month and 10 days   Intent: Curative  Radiation Treatment Dates: 08/02/2021 through 09/18/2021 Site Technique Total Dose (Gy) Dose per Fx (Gy) Completed Fx Beam Energies  Lung, Left: Lung_L 3D 60/60 2 30/30 6X    Narrative:  The patient returns today for routine follow-up. The patient tolerated radiation therapy relatively well. On the date of her final treatment, the patient reported fatigue, peeling and redness to her left upper back, and occasional cough (on 3L O2). She denied any significant swallowing issues.  Physical exam performed on the date of her final treatment revealed some mild wheezing to auscultation in the left lung with good air movement throughout.  Examination of the back area revealed hyperpigmentation changes at the site of her radiation portal.  A small area of skin breakdown superiorly was also noted for which we recommend antibiotic ointment.          In the midst of undergoing radiation treatment, the patient presented in the ED on 08/07/21 with c/o: palpitations, tachycardia, and heart rate in 160s. She was found to be SVT at that time which resolved with metoprolol.  Chest x-ray performed also demonstrated opacification of the left hemithorax.    The  patient then returned to the ED on 08/08/21 and was found to be in paroxysmal a-fib. In the ED, she was started on amiodarone gtt with return to normal sinus rhythm.  Lads collected showed BNP 360, troponin 40> 52, lactic acid 1.0, procalcitonin 0.12, and she was accordingly admitted for further evaluation. Chest CT with contrast performed showed interval development of postobstructive infection/collapse of the entire left lung in the setting of known left hilar mass lesion, and interval development of at least a small volume left pleural effusion.  UA later collected while inpatient was positive and was started on IV ceftriaxone.  Pulmonology was consulted who recommended no intervention for pt's collapsed left lung. Following completion of chemo / XRT, the patient was advised to consult with Dr Roxan Hockey about whether VATS cleanout or chest tube might enhance chances for her left lung to re-inflate.  Cardiology was also consulted who recommended continue amiodarone.  At discharge, she was arranged with home health services.   - Pt also had a head CT performed on 06/22 while admitted which showed a possible nonspecific enhancement in the pons. MRI of the brain on 06/23 showed no evidence of intracranial metastatic disease.   Restaging chest CT on 10/06/21 showed a positive response to therapy demonstrated by regression of the large infiltrative neoplasm in the left lung and re-expansion of the left lung. CT however showed a new thick-walled cavitary lesion and left upper lobe nodule, along with persistent fullness in the left hilar region which likely represents residual malignant tissue and/or lymphadenopathy. A small  partially loculated left pleural effusion was also appreciated to display a decrease in size compared to the prior study, but was noted as likely malignant. Of note: CT incidentally noted an abnormal appearing gallbladder, possibly suggestive of indwelling stones.  The patient completed her  6th and final cycle of carboplatin and paclitaxel on 09/17/21. During her most recent follow up with Dr. Julien Nordmann on 10/11/21, the patient was noted to have tolerated systemic treatment well other than mild odynophagia and dysphagia. Moving forward, the patient has agreed to proceed with consolidation immunotherapy consisting of Imfinzi 1500 mg IV every 4 weeks.   ***   Allergies:  is allergic to codeine.  Meds: Current Outpatient Medications  Medication Sig Dispense Refill   acetaminophen (TYLENOL) 500 MG tablet Take 500 mg by mouth every 6 (six) hours as needed for moderate pain or fever.     albuterol (PROAIR HFA) 108 (90 Base) MCG/ACT inhaler 2 puffs up to every 4 hours as needed only  if your can't catch your breath (Patient taking differently: Inhale 2 puffs into the lungs every 4 (four) hours as needed for shortness of breath.)     albuterol (PROVENTIL) (2.5 MG/3ML) 0.083% nebulizer solution Take 2.5 mg by nebulization 3 (three) times daily as needed for wheezing or shortness of breath.     amiodarone (PACERONE) 100 MG tablet Take 1 tablet (100 mg total) by mouth daily. 30 tablet 6   aspirin 81 MG tablet Take 81 mg by mouth at bedtime.     Coenzyme Q10 (CO Q 10 PO) Take 1 capsule by mouth daily.     furosemide (LASIX) 20 MG tablet Take 1 tablet po BID for 5 days then go back to taking 1 tablet po QD 90 tablet 1   Glycopyrrolate-Formoterol (BEVESPI AEROSPHERE) 9-4.8 MCG/ACT AERO Inhale 2 puffs into the lungs 2 (two) times daily. 10.7 g 5   levothyroxine (SYNTHROID) 25 MCG tablet TAKE 0.5 TABLETS (12.5 MCG TOTAL) BY MOUTH DAILY. IN ADDITION TO THE 50 MCG. (Patient taking differently: Take 12.5 mcg by mouth See admin instructions. 12.5 mcg daily except on sunday) 45 tablet 1   levothyroxine (SYNTHROID) 50 MCG tablet TAKE 1 TABLET BY MOUTH EVERY DAY BEFORE BREAKFAST (Patient taking differently: Take 50 mcg by mouth See admin instructions. Along with 12.5 mcg qd except on Sunday (pt just takes 50  mcg)) 90 tablet 3   lidocaine-prilocaine (EMLA) cream Apply 1 Application topically as needed. (Patient not taking: Reported on 10/11/2021) 30 g 2   metoprolol succinate (TOPROL-XL) 100 MG 24 hr tablet Take 1 tablet (100 mg total) by mouth daily. Take with or immediately following a meal. 90 tablet 1   naproxen sodium (ALEVE) 220 MG tablet Take 220 mg by mouth as needed.     Omega-3 Fatty Acids (OMEGA-3 PO) Take 1,000 mg by mouth in the morning and at bedtime.     OXYGEN Inhale 3.5 L into the lungs continuous.     Phenylephrine-Acetaminophen (TYLENOL SINUS CONGESTION/PAIN PO) Take by mouth daily as needed.     prochlorperazine (COMPAZINE) 10 MG tablet Take 1 tablet (10 mg total) by mouth every 6 (six) hours as needed. (Patient not taking: Reported on 10/11/2021) 30 tablet 2   rosuvastatin (CRESTOR) 40 MG tablet Take 1 tablet (40 mg total) by mouth daily. 90 tablet 3   No current facility-administered medications for this encounter.    Physical Findings: The patient is in no acute distress. Patient is alert and oriented.  vitals were not  taken for this visit. .  No significant changes. Lungs are clear to auscultation bilaterally. Heart has regular rate and rhythm. No palpable cervical, supraclavicular, or axillary adenopathy. Abdomen soft, non-tender, normal bowel sounds.   Lab Findings: Lab Results  Component Value Date   WBC 6.9 10/18/2021   HGB 9.5 (L) 10/18/2021   HCT 31.0 (L) 10/18/2021   MCV 94.5 10/18/2021   PLT 241 10/18/2021    Radiographic Findings: CT Chest W Contrast  Result Date: 10/09/2021 CLINICAL DATA:  76 year old female with history of non-small cell lung cancer. Evaluate for treatment response. * Tracking Code: BO * EXAM: CT CHEST WITH CONTRAST TECHNIQUE: Multidetector CT imaging of the chest was performed during intravenous contrast administration. RADIATION DOSE REDUCTION: This exam was performed according to the departmental dose-optimization program which includes  automated exposure control, adjustment of the mA and/or kV according to patient size and/or use of iterative reconstruction technique. CONTRAST:  35mL OMNIPAQUE IOHEXOL 300 MG/ML  SOLN COMPARISON:  Chest CT 08/07/2021. FINDINGS: Cardiovascular: Heart size is normal. There is no significant pericardial fluid, thickening or pericardial calcification. There is aortic atherosclerosis, as well as atherosclerosis of the great vessels of the mediastinum and the coronary arteries, including calcified atherosclerotic plaque in the left main, left anterior descending, left circumflex and right coronary arteries. Status post median sternotomy for CABG including LIMA to the LAD. Right internal jugular single-lumen Port-A-Cath with tip terminating at the superior cavoatrial junction. Mediastinum/Nodes: Amorphous soft tissue in the left hilar region slightly less pronounced than the prior examination, but likely reflective of residual mass and associated left hilar lymphadenopathy, best appreciated on axial image 62 of series 2 where this causes severe narrowing of multiple bronchi extending to the left lower lobe. No other pathologically enlarged mediastinal or hilar lymph nodes are noted on today's examination. Esophagus is unremarkable in appearance. No axillary lymphadenopathy. Lungs/Pleura: Compared to the prior study there has been interval re-expansion of much of the left lung. There is a new thick-walled cavitary area in the left upper lobe (axial image 50 of series 7), which is associated with a nodular area of architectural distortion along its inferior margin (axial image 55 of series 7) measuring 2.8 x 1.7 cm. Patchy areas of ground-glass attenuation, septal thickening and extensive architectural distortion and volume loss are noted in the left lung. Similar findings are present to a lesser extent in the right lung as well. Diffuse bronchial wall thickening with mild centrilobular and paraseptal emphysema. Small left  pleural effusion appears partially loculated and is likely malignant. No right pleural effusion. Upper Abdomen: Aortic atherosclerosis. Gallbladder wall appears thickened and edematous, with slightly irregular contours. No calcified gallstones are confidently identified, however, noncalcified stones are suspected. 12 mm high attenuation (69 HU) lesion in the upper pole of the left kidney (axial image 123 of series 2), incompletely characterized. Musculoskeletal: Median sternotomy wires. There are no aggressive appearing lytic or blastic lesions noted in the visualized portions of the skeleton. IMPRESSION: 1. Today's study demonstrates a positive response to therapy with some regression of the large infiltrative neoplasm in the left lung and re-expansion of the left lung. There is a new thick-walled cavitary lesion and left upper lobe nodule, along with persistent fullness in the left hilar region which likely represents residual malignant tissue and/or lymphadenopathy. Small partially loculated left pleural effusion has decreased in size compared to the prior study, but is likely malignant. 2. Abnormal appearance of the gallbladder, which could suggest the presence of indwelling stones. Gallbladder  wall appears slightly thickened and edematous. If there is any clinical concern for acute cholecystitis or other gallbladder pathology, further evaluation with right upper quadrant abdominal ultrasound should be considered at this time. 3. Aortic atherosclerosis, in addition to left main and three-vessel coronary artery disease. Status post median sternotomy for CABG including LIMA to the LAD. 4. Mild diffuse bronchial wall thickening with mild centrilobular and paraseptal emphysema; imaging findings suggestive of underlying COPD. 5. Additional incidental findings, as above. Aortic Atherosclerosis (ICD10-I70.0) and Emphysema (ICD10-J43.9). Electronically Signed   By: Vinnie Langton M.D.   On: 10/09/2021 06:26     Impression: The encounter diagnosis was Non-small cell cancer of left lung ([place]).   Invasive moderately differentiated squamous cell carcinoma of the LLL, p63 positive   PET positive pulmonary nodule presenting in the left lower lobe, clinical stage IA2 (T1b, N0, M0)   (77mm subpleural nodule in the left lower lobe, suspicious for primary bronchogenic carcinoma)    Biopsy proven non-small cell lung cancer in the left hilar region   The patient is recovering from the effects of radiation.  ***  Plan:  ***   *** minutes of total time was spent for this patient encounter, including preparation, face-to-face counseling with the patient and coordination of care, physical exam, and documentation of the encounter. ____________________________________  [name], PhD, MD  This document serves as a record of services personally performed by Gery Pray, MD. It was created on his behalf by Roney Mans, a trained medical scribe. The creation of this record is based on the scribe's personal observations and the provider's statements to them. This document has been checked and approved by the attending provider.

## 2021-10-29 ENCOUNTER — Other Ambulatory Visit: Payer: Self-pay

## 2021-10-29 ENCOUNTER — Ambulatory Visit
Admission: RE | Admit: 2021-10-29 | Discharge: 2021-10-29 | Disposition: A | Discharge status: Home / Self Care | Payer: Medicare HMO | Source: Ambulatory Visit | Attending: Radiation Oncology | Admitting: Radiation Oncology

## 2021-10-29 DIAGNOSIS — R609 Edema, unspecified: Secondary | ICD-10-CM | POA: Diagnosis not present

## 2021-10-29 DIAGNOSIS — R1310 Dysphagia, unspecified: Secondary | ICD-10-CM | POA: Insufficient documentation

## 2021-10-29 DIAGNOSIS — J432 Centrilobular emphysema: Secondary | ICD-10-CM | POA: Insufficient documentation

## 2021-10-29 DIAGNOSIS — C3412 Malignant neoplasm of upper lobe, left bronchus or lung: Secondary | ICD-10-CM | POA: Diagnosis not present

## 2021-10-29 DIAGNOSIS — Z79899 Other long term (current) drug therapy: Secondary | ICD-10-CM | POA: Diagnosis not present

## 2021-10-29 DIAGNOSIS — Z7982 Long term (current) use of aspirin: Secondary | ICD-10-CM | POA: Insufficient documentation

## 2021-10-29 DIAGNOSIS — Z9221 Personal history of antineoplastic chemotherapy: Secondary | ICD-10-CM | POA: Insufficient documentation

## 2021-10-29 DIAGNOSIS — Z923 Personal history of irradiation: Secondary | ICD-10-CM | POA: Diagnosis not present

## 2021-10-29 DIAGNOSIS — I700 Atherosclerosis of aorta: Secondary | ICD-10-CM | POA: Diagnosis not present

## 2021-10-29 DIAGNOSIS — Z79890 Hormone replacement therapy (postmenopausal): Secondary | ICD-10-CM | POA: Insufficient documentation

## 2021-10-29 DIAGNOSIS — C3492 Malignant neoplasm of unspecified part of left bronchus or lung: Secondary | ICD-10-CM

## 2021-10-29 DIAGNOSIS — I2510 Atherosclerotic heart disease of native coronary artery without angina pectoris: Secondary | ICD-10-CM | POA: Insufficient documentation

## 2021-10-29 NOTE — Progress Notes (Signed)
[name] is here today for follow up post radiation to the lung.  Lung Side: Left, patient completed treatment on 09/18/21.  Does the patient complain of any of the following: Pain:no Shortness of breath w/wo exertion: no - on 3L of oxygen Cough: dry cough Hemoptysis: no Pain with swallowing: no Swallowing/choking concerns: no Appetite: good Energy Level: ok Post radiation skin Changes: no    Additional comments if applicable: She is now getting immunotherapy once a month.  She is wondering when she will be scheduled for a scan and if the radiation/chemotherapy worked.  There were no vitals taken for this visit.

## 2021-11-12 ENCOUNTER — Other Ambulatory Visit: Payer: Self-pay | Admitting: Nurse Practitioner

## 2021-11-12 DIAGNOSIS — R600 Localized edema: Secondary | ICD-10-CM

## 2021-11-12 DIAGNOSIS — E876 Hypokalemia: Secondary | ICD-10-CM

## 2021-11-15 ENCOUNTER — Encounter: Payer: Self-pay | Admitting: Internal Medicine

## 2021-11-15 ENCOUNTER — Other Ambulatory Visit: Payer: Self-pay

## 2021-11-15 ENCOUNTER — Inpatient Hospital Stay: Payer: Medicare HMO | Attending: Internal Medicine

## 2021-11-15 ENCOUNTER — Inpatient Hospital Stay: Payer: Medicare HMO

## 2021-11-15 ENCOUNTER — Inpatient Hospital Stay (HOSPITAL_BASED_OUTPATIENT_CLINIC_OR_DEPARTMENT_OTHER): Payer: Medicare HMO | Admitting: Internal Medicine

## 2021-11-15 VITALS — BP 110/61 | HR 69 | Resp 17

## 2021-11-15 DIAGNOSIS — C3492 Malignant neoplasm of unspecified part of left bronchus or lung: Secondary | ICD-10-CM | POA: Diagnosis not present

## 2021-11-15 DIAGNOSIS — Z923 Personal history of irradiation: Secondary | ICD-10-CM | POA: Diagnosis not present

## 2021-11-15 DIAGNOSIS — Z5112 Encounter for antineoplastic immunotherapy: Secondary | ICD-10-CM | POA: Insufficient documentation

## 2021-11-15 DIAGNOSIS — Z79899 Other long term (current) drug therapy: Secondary | ICD-10-CM | POA: Insufficient documentation

## 2021-11-15 DIAGNOSIS — J449 Chronic obstructive pulmonary disease, unspecified: Secondary | ICD-10-CM | POA: Diagnosis not present

## 2021-11-15 DIAGNOSIS — I10 Essential (primary) hypertension: Secondary | ICD-10-CM | POA: Diagnosis not present

## 2021-11-15 DIAGNOSIS — Z95828 Presence of other vascular implants and grafts: Secondary | ICD-10-CM

## 2021-11-15 LAB — CBC WITH DIFFERENTIAL (CANCER CENTER ONLY)
Abs Immature Granulocytes: 0.04 10*3/uL (ref 0.00–0.07)
Basophils Absolute: 0.1 10*3/uL (ref 0.0–0.1)
Basophils Relative: 1 %
Eosinophils Absolute: 0.2 10*3/uL (ref 0.0–0.5)
Eosinophils Relative: 2 %
HCT: 32.7 % — ABNORMAL LOW (ref 36.0–46.0)
Hemoglobin: 10.2 g/dL — ABNORMAL LOW (ref 12.0–15.0)
Immature Granulocytes: 1 %
Lymphocytes Relative: 15 %
Lymphs Abs: 1.1 10*3/uL (ref 0.7–4.0)
MCH: 29.9 pg (ref 26.0–34.0)
MCHC: 31.2 g/dL (ref 30.0–36.0)
MCV: 95.9 fL (ref 80.0–100.0)
Monocytes Absolute: 1.1 10*3/uL — ABNORMAL HIGH (ref 0.1–1.0)
Monocytes Relative: 15 %
Neutro Abs: 5.1 10*3/uL (ref 1.7–7.7)
Neutrophils Relative %: 66 %
Platelet Count: 226 10*3/uL (ref 150–400)
RBC: 3.41 MIL/uL — ABNORMAL LOW (ref 3.87–5.11)
RDW: 17.2 % — ABNORMAL HIGH (ref 11.5–15.5)
WBC Count: 7.6 10*3/uL (ref 4.0–10.5)
nRBC: 0 % (ref 0.0–0.2)

## 2021-11-15 LAB — CMP (CANCER CENTER ONLY)
ALT: 24 U/L (ref 0–44)
AST: 26 U/L (ref 15–41)
Albumin: 3.8 g/dL (ref 3.5–5.0)
Alkaline Phosphatase: 74 U/L (ref 38–126)
Anion gap: 8 (ref 5–15)
BUN: 12 mg/dL (ref 8–23)
CO2: 35 mmol/L — ABNORMAL HIGH (ref 22–32)
Calcium: 9.1 mg/dL (ref 8.9–10.3)
Chloride: 100 mmol/L (ref 98–111)
Creatinine: 0.75 mg/dL (ref 0.44–1.00)
GFR, Estimated: >60 mL/min
Glucose, Bld: 95 mg/dL (ref 70–99)
Potassium: 4 mmol/L (ref 3.5–5.1)
Sodium: 143 mmol/L (ref 135–145)
Total Bilirubin: 0.4 mg/dL (ref 0.3–1.2)
Total Protein: 7 g/dL (ref 6.5–8.1)

## 2021-11-15 LAB — TSH: TSH: 5.457 u[IU]/mL — ABNORMAL HIGH (ref 0.350–4.500)

## 2021-11-15 MED ORDER — SODIUM CHLORIDE 0.9% FLUSH
10.0000 mL | INTRAVENOUS | Status: DC | PRN
  Administered 2021-11-15: 10 mL

## 2021-11-15 MED ORDER — SODIUM CHLORIDE 0.9 % IV SOLN: Freq: Once | INTRAVENOUS | Status: AC

## 2021-11-15 MED ORDER — SODIUM CHLORIDE 0.9 % IV SOLN
1500.0000 mg | Freq: Once | INTRAVENOUS | Status: AC
  Administered 2021-11-15: 1500 mg via INTRAVENOUS
  Filled 2021-11-15: qty 30

## 2021-11-15 MED ORDER — HEPARIN SOD (PORK) LOCK FLUSH 100 UNIT/ML IV SOLN
500.0000 [IU] | Freq: Once | INTRAVENOUS | Status: AC | PRN
  Administered 2021-11-15: 500 [IU]

## 2021-11-15 MED ORDER — SODIUM CHLORIDE 0.9% FLUSH
10.0000 mL | Freq: Once | INTRAVENOUS | Status: AC
  Administered 2021-11-15: 10 mL

## 2021-11-15 NOTE — Progress Notes (Signed)
[facility] [phone]   [phone]  OFFICE PROGRESS NOTE  [name], NP [place] [address]  DIAGNOSIS:  Stage IIIa (T3, N2, M0) non-small cell lung cancer, squamous cell carcinoma presented with left hilar mass with suspicious mediastinal lymphadenopathy on the last CT scan of the chest at [facility].  PRIOR THERAPY: A course of concurrent chemoradiation with weekly carboplatin for AUC of 2 and paclitaxel 45 Mg/M2.  Status post 6 cycles.  CURRENT THERAPY: Consolidation treatment with immunotherapy with Imfinzi 1500 Mg IV every 4 weeks.  First dose October 18, 2021.  Status post 1 cycle.  INTERVAL HISTORY: [name] 76 y.o. female returns to the clinic today for follow-up visit accompanied by her husband.  The patient continues to complain of the baseline shortness of breath and she is currently on home oxygen 3 L/minute nasal cannula and her oxygen saturation is 96%.  She denied having any chest pain but has mild cough with no hemoptysis.  She has no nausea, vomiting, diarrhea or constipation.  She has no headache or visual changes.  She denied having any recent weight loss or night sweats.  She tolerated the first cycle of her consolidation treatment with immunotherapy fairly well.  She is here today for evaluation before starting cycle #2.  MEDICAL HISTORY: Past Medical History:  Diagnosis Date   CAD (coronary artery disease)    Cancer ([place])    COPD (chronic obstructive pulmonary disease) ([place])    per DM note, pt denies   Dyslipidemia    GERD (gastroesophageal reflux disease)    History of radiation therapy    Left lung- 01/16/21-01/30/21- Dr. Gery Pray   History of radiation therapy    Left lung- 08/02/21-09/18/21- Dr. Gery Pray   HTN (hypertension)    Hypothyroidism    Myocardial infarct [facility])    Non-small cell lung cancer ([place]) 08/19/2010   Stage IA, status post left upper lobectomy  July 2012   Tobacco abuse     ALLERGIES:  is allergic to codeine.  MEDICATIONS:  Current Outpatient Medications  Medication Sig Dispense Refill   acetaminophen (TYLENOL) 500 MG tablet Take 500 mg by mouth every 6 (six) hours as needed for moderate pain or fever.     albuterol (PROAIR HFA) 108 (90 Base) MCG/ACT inhaler 2 puffs up to every 4 hours as needed only  if your can't catch your breath (Patient not taking: Reported on 10/29/2021)     albuterol (PROVENTIL) (2.5 MG/3ML) 0.083% nebulizer solution Take 2.5 mg by nebulization 3 (three) times daily as needed for wheezing or shortness of breath.     amiodarone (PACERONE) 100 MG tablet Take 1 tablet (100 mg total) by mouth daily. 30 tablet 6   aspirin 81 MG tablet Take 81 mg by mouth at bedtime.     Coenzyme Q10 (CO Q 10 PO) Take 1 capsule by mouth daily.     furosemide (LASIX) 20 MG tablet Take 1 tablet po BID for 5 days then go back to taking 1 tablet po QD 90 tablet 1   Glycopyrrolate-Formoterol (BEVESPI AEROSPHERE) 9-4.8 MCG/ACT AERO Inhale 2 puffs into the lungs 2 (two) times daily. 10.7 g 5   KLOR-CON M10 10 MEQ tablet TAKE 1 TABLET BY MOUTH EVERY DAY 90 tablet 1   levothyroxine (SYNTHROID) 25 MCG tablet TAKE 0.5 TABLETS (12.5 MCG TOTAL) BY MOUTH DAILY. IN ADDITION TO THE 50 MCG. (Patient taking differently:  Take 12.5 mcg by mouth See admin instructions. 12.5 mcg daily except on sunday) 45 tablet 1   levothyroxine (SYNTHROID) 50 MCG tablet TAKE 1 TABLET BY MOUTH EVERY DAY BEFORE BREAKFAST (Patient taking differently: Take 50 mcg by mouth See admin instructions. Along with 12.5 mcg qd except on Sunday (pt just takes 50 mcg)) 90 tablet 3   lidocaine-prilocaine (EMLA) cream Apply 1 Application topically as needed. 30 g 2   metoprolol succinate (TOPROL-XL) 100 MG 24 hr tablet Take 1 tablet (100 mg total) by mouth daily. Take with or immediately following a meal. 90 tablet 1   naproxen sodium (ALEVE) 220 MG tablet Take 220 mg by mouth as needed.      Omega-3 Fatty Acids (OMEGA-3 PO) Take 1,000 mg by mouth in the morning and at bedtime.     OXYGEN Inhale 3.5 L into the lungs continuous.     Phenylephrine-Acetaminophen (TYLENOL SINUS CONGESTION/PAIN PO) Take by mouth daily as needed.     prochlorperazine (COMPAZINE) 10 MG tablet Take 1 tablet (10 mg total) by mouth every 6 (six) hours as needed. (Patient not taking: Reported on 10/11/2021) 30 tablet 2   rosuvastatin (CRESTOR) 40 MG tablet Take 1 tablet (40 mg total) by mouth daily. 90 tablet 3   No current facility-administered medications for this visit.    SURGICAL HISTORY:  Past Surgical History:  Procedure Laterality Date   CARDIAC CATHETERIZATION  07/06/2010   see CABG report - pt sent to OR   CARDIOVASCULAR STRESS TEST  09/11/2010   R/S MV - normal perfusion in all regions, EF 46%, no scintigraphic evidence of inducible myocardial ischemia; global LV systolic function mildly reduced; no significant wall motion abnormalities noted; Exercise capacity 7 METS; EKG negative for ischemia; low risk study, no signifcant change from previous study 08/2003   CORONARY ARTERY BYPASS GRAFT  07/11/2010   LIMA to LAD; SVG to 2nd branch OM; SVG to posterior descending artery   IR IMAGING GUIDED PORT INSERTION  08/16/2021   LEFT VATS  09/17/2010   TEE WITHOUT CARDIOVERSION  07/06/2010   during emergent CABG surgery; 2-3+ mitral regurgitation   VIDEO BRONCHOSCOPY N/A 07/27/2021   Procedure: VIDEO BRONCHOSCOPY;  Surgeon: Melrose Nakayama, MD;  Location: [facility];  Service: Thoracic;  Laterality: N/A;   VIDEO BRONCHOSCOPY WITH ENDOBRONCHIAL ULTRASOUND N/A 07/27/2021   Procedure: VIDEO BRONCHOSCOPY WITH ENDOBRONCHIAL ULTRASOUND;  Surgeon: Melrose Nakayama, MD;  Location: [facility];  Service: Thoracic;  Laterality: N/A;    REVIEW OF SYSTEMS:  A comprehensive review of systems was negative except for: Constitutional: positive for fatigue Respiratory: positive for cough and dyspnea on exertion    PHYSICAL EXAMINATION: General appearance: alert, cooperative, fatigued, and no distress Head: Normocephalic, without obvious abnormality, atraumatic Neck: no adenopathy, no JVD, supple, symmetrical, trachea midline, and thyroid not enlarged, symmetric, no tenderness/mass/nodules Lymph nodes: Cervical, supraclavicular, and axillary nodes normal. Resp: wheezes bilaterally Back: symmetric, no curvature. ROM normal. No CVA tenderness. Cardio: regular rate and rhythm, S1, S2 normal, no murmur, click, rub or gallop GI: soft, non-tender; bowel sounds normal; no masses,  no organomegaly Extremities: extremities normal, atraumatic, no cyanosis or edema  ECOG PERFORMANCE STATUS: 1 - Symptomatic but completely ambulatory  Blood pressure 111/61, pulse 71, temperature 98.7 F (37.1 C), temperature source Oral, resp. rate 16, weight 213 lb 11.2 oz (96.9 kg), SpO2 96 %.  LABORATORY DATA: Lab Results  Component Value Date   WBC 7.6 11/15/2021   HGB 10.2 (L) 11/15/2021  HCT 32.7 (L) 11/15/2021   MCV 95.9 11/15/2021   PLT 226 11/15/2021      Chemistry      Component Value Date/Time   NA 142 10/18/2021 1209   NA 143 05/02/2021 1055   K 3.8 10/18/2021 1209   CL 103 10/18/2021 1209   CO2 36 (H) 10/18/2021 1209   BUN 10 10/18/2021 1209   BUN 13 05/02/2021 1055   CREATININE 0.71 10/18/2021 1209   CREATININE 0.81 07/02/2016 1323      Component Value Date/Time   CALCIUM 9.4 10/18/2021 1209   ALKPHOS 65 10/18/2021 1209   AST 23 10/18/2021 1209   ALT 20 10/18/2021 1209   BILITOT 0.3 10/18/2021 1209       RADIOGRAPHIC STUDIES: No results found.  ASSESSMENT AND PLAN: This is a very pleasant 76 years old white female with Stage IIb/IIIa (T3, N0/N2, M0) non-small cell lung cancer, squamous cell carcinoma presented with left hilar mass with suspicious mediastinal lymphadenopathy on the last CT scan of the chest at [facility]. She also has a history of stage Ia non-small cell lung cancer,  adenocarcinoma status post surgical resection in July 2012 as well as recurrent stage Ia non-small cell lung cancer, squamous cell carcinoma status post SBRT completed January 30, 2021. The patient completed a course of concurrent chemoradiation with weekly carboplatin for AUC of 2 and paclitaxel 45 Mg/M2 status post 6 cycles.   She is currently undergoing consolidation treatment with immunotherapy with Imfinzi 1500 Mg IV every 4 weeks.  Status post 1 cycle started on 8/31 2023. The patient has been tolerating this treatment fairly well with no concerning adverse effects. I recommended for her to proceed with cycle #2 today as planned. I will see her back for follow-up visit in 4 weeks for evaluation before starting cycle #3. For the history of COPD, she will contact her pulmonologist for further evaluation and recommendation. The patient was advised to call immediately if she has any other concerning symptoms in the interval. The patient voices understanding of current disease status and treatment options and is in agreement with the current care plan.  All questions were answered. The patient knows to call the clinic with any problems, questions or concerns. We can certainly see the patient much sooner if necessary.  The total time spent in the appointment was 20 minutes.  Disclaimer: This note was dictated with voice recognition software. Similar sounding words can inadvertently be transcribed and may not be corrected upon review.

## 2021-11-15 NOTE — Patient Instructions (Signed)
[facility]COL[facility]  Discharge Instructions: Thank you for choosing [facility] to provide your oncology and hematology care.   If you have a lab appointment with the [facility], please go directly to the [facility] and check in at the registration area.   Wear comfortable clothing and clothing appropriate for easy access to any Portacath or PICC line.   We strive to give you quality time with your provider. You may need to reschedule your appointment if you arrive late (15 or more minutes).  Arriving late affects you and other patients whose appointments are after yours.  Also, if you miss three or more appointments without notifying the office, you may be dismissed from the clinic at the provider's discretion.      For prescription refill requests, have your pharmacy contact our office and allow 72 hours for refills to be completed.    Today you received the following chemotherapy and/or immunotherapy agents: durvalumab      To help prevent nausea and vomiting after your treatment, we encourage you to take your nausea medication as directed.  BELOW ARE SYMPTOMS THAT SHOULD BE REPORTED IMMEDIATELY: *FEVER GREATER THAN 100.4 F (38 C) OR HIGHER *CHILLS OR SWEATING *NAUSEA AND VOMITING THAT IS NOT CONTROLLED WITH YOUR NAUSEA MEDICATION *UNUSUAL SHORTNESS OF BREATH *UNUSUAL BRUISING OR BLEEDING *URINARY PROBLEMS (pain or burning when urinating, or frequent urination) *BOWEL PROBLEMS (unusual diarrhea, constipation, pain near the anus) TENDERNESS IN MOUTH AND THROAT WITH OR WITHOUT PRESENCE OF ULCERS (sore throat, sores in mouth, or a toothache) UNUSUAL RASH, SWELLING OR PAIN  UNUSUAL VAGINAL DISCHARGE OR ITCHING   Items with * indicate a potential emergency and should be followed up as soon as possible or go to the Emergency Department if any problems should occur.  Please show the CHEMOTHERAPY ALERT CARD or IMMUNOTHERAPY ALERT CARD at check-in to  the Emergency Department and triage nurse.  Should you have questions after your visit or need to cancel or reschedule your appointment, please contact [facility]  Dept: [phone]  and follow the prompts.  Office hours are 8:00 a.m. to 4:30 p.m. Monday - Friday. Please note that voicemails left after 4:00 p.m. may not be returned until the following business day.  We are closed weekends and major holidays. You have access to a nurse at all times for urgent questions. Please call the main number to the clinic Dept: [phone] and follow the prompts.   For any non-urgent questions, you may also contact your provider using MyChart. We now offer e-Visits for anyone 7 and older to request care online for non-urgent symptoms. For details visit [email or web address].   Also download the MyChart app! Go to the app store, search "MyChart", open the app, select [facility], and log in with your MyChart username and password.  Masks are optional in the cancer centers. If you would like for your care team to wear a mask while they are taking care of you, please let them know. You may have one support person who is at least 75 years old accompany you for your appointments.

## 2021-11-25 DIAGNOSIS — J9601 Acute respiratory failure with hypoxia: Secondary | ICD-10-CM | POA: Diagnosis not present

## 2021-12-06 ENCOUNTER — Other Ambulatory Visit: Payer: Self-pay

## 2021-12-10 ENCOUNTER — Other Ambulatory Visit: Payer: Self-pay

## 2021-12-11 NOTE — Progress Notes (Unsigned)
[facility] OFFICE PROGRESS NOTE  Megan Freshwater, NP [place] [address]  DIAGNOSIS:  Stage II/IIIa (T3, N0/N2, M0) non-small cell lung cancer, squamous cell carcinoma presented with left hilar mass with suspicious mediastinal lymphadenopathy on the last CT scan of the chest at [facility].  PRIOR THERAPY: A course of concurrent chemoradiation with weekly carboplatin for AUC of 2 and paclitaxel 45 Mg/M2.  Status post 6 cycles.  Last dose 09/17/2021  CURRENT THERAPY:  Consolidation immunotherapy with Imfinzi 1500 mg IV every 4 weeks.  First dose expected on 10/18/2021. Status post 2 cycles.   INTERVAL HISTORY: [name] 76 y.o. female returns to the clinic today for a follow-up visit accompanied by her husband.  The patient was last seen 1 month ago by Dr. Julien Zimmerman.  The patient is currently undergoing consolidation immunotherapy with Imfinzi.  She is tolerating it well without any concerning adverse side effects.  Since last being seen she denies any changes in her health.  She denies any fever, chills, night sweats, or or unexplained weight loss. She reports baseline dyspnea on exertion for which she is on 3 L of supplemental oxygen.  She reports she is not coughing as much as she used to. Denies chest pain or hemoptysis. She does sometimes report orthostatic hypotension. She states this started around when she was put on amiodarone last year. Her dose was reduced and her symptoms improved but she still continues to sometimes feel lightheaded when she stands up. Denies headaches, balance changes, visual changes, extremity weakness, or speech changes. She reports allergies and nasal drainage. She does not take an antihistamine. She denies any nausea, vomiting, diarrhea, or  She is here today for evaluation and repeat blood work before undergoing cycle #3.   MEDICAL HISTORY: Past Medical History:  Diagnosis Date   CAD (coronary artery disease)     Cancer ([facility])    COPD (chronic obstructive pulmonary disease) ([facility])    per DM note, pt denies   Dyslipidemia    GERD (gastroesophageal reflux disease)    History of radiation therapy    Left lung- 01/16/21-01/30/21- [name]   History of radiation therapy    Left lung- 08/02/21-09/18/21- [name]   HTN (hypertension)    Hypothyroidism    Myocardial infarct [facility])    Non-small cell lung cancer ([facility]) 08/19/2010   Stage IA, status post left upper lobectomy July 2012   Tobacco abuse     ALLERGIES:  is allergic to codeine.  MEDICATIONS:  Current Outpatient Medications  Medication Sig Dispense Refill   acetaminophen (TYLENOL) 500 MG tablet Take 500 mg by mouth every 6 (six) hours as needed for moderate pain or fever.     albuterol (PROVENTIL) (2.5 MG/3ML) 0.083% nebulizer solution Take 2.5 mg by nebulization 3 (three) times daily as needed for wheezing or shortness of breath.     amiodarone (PACERONE) 100 MG tablet Take 1 tablet (100 mg total) by mouth daily. 30 tablet 6   aspirin 81 MG tablet Take 81 mg by mouth at bedtime.     Coenzyme Q10 (CO Q 10 PO) Take 1 capsule by mouth daily.     furosemide (LASIX) 20 MG tablet Take 1 tablet po BID for 5 days then go back to taking 1 tablet po QD 90 tablet 1   Glycopyrrolate-Formoterol (BEVESPI AEROSPHERE) 9-4.8 MCG/ACT AERO Inhale 2 puffs into the lungs 2 (two) times daily. (Patient taking differently: Inhale 2  puffs into the lungs daily.) 10.7 g 5   KLOR-CON M10 10 MEQ tablet TAKE 1 TABLET BY MOUTH EVERY DAY 90 tablet 1   levothyroxine (SYNTHROID) 25 MCG tablet TAKE 0.5 TABLETS (12.5 MCG TOTAL) BY MOUTH DAILY. IN ADDITION TO THE 50 MCG. (Patient taking differently: Take 12.5 mcg by mouth See admin instructions. 12.5 mcg daily except on sunday) 45 tablet 1   levothyroxine (SYNTHROID) 50 MCG tablet TAKE 1 TABLET BY MOUTH EVERY DAY BEFORE BREAKFAST (Patient taking differently: Take 50 mcg by mouth See admin instructions. Along with 12.5  mcg qd except on Sunday (pt just takes 50 mcg)) 90 tablet 3   lidocaine-prilocaine (EMLA) cream Apply 1 Application topically as needed. 30 g 2   metoprolol succinate (TOPROL-XL) 100 MG 24 hr tablet Take 1 tablet (100 mg total) by mouth daily. Take with or immediately following a meal. 90 tablet 1   naproxen sodium (ALEVE) 220 MG tablet Take 220 mg by mouth as needed.     Omega-3 Fatty Acids (OMEGA-3 PO) Take 1,000 mg by mouth in the morning and at bedtime.     OXYGEN Inhale 3.5 L into the lungs continuous.     Phenylephrine-Acetaminophen (TYLENOL SINUS CONGESTION/PAIN PO) Take by mouth daily as needed.     prochlorperazine (COMPAZINE) 10 MG tablet Take 1 tablet (10 mg total) by mouth every 6 (six) hours as needed. 30 tablet 2   rosuvastatin (CRESTOR) 40 MG tablet Take 1 tablet (40 mg total) by mouth daily. 90 tablet 3   albuterol (PROAIR HFA) 108 (90 Base) MCG/ACT inhaler 2 puffs up to every 4 hours as needed only  if your can't catch your breath     No current facility-administered medications for this visit.   Facility-Administered Medications Ordered in Other Visits  Medication Dose Route Frequency Provider Last Rate Last Admin   durvalumab (IMFINZI) 1,500 mg in sodium chloride 0.9 % 100 mL chemo infusion  1,500 mg Intravenous Once Megan Bears, MD       heparin lock flush 100 unit/mL  500 Units Intracatheter Once PRN Megan Bears, MD       sodium chloride flush (NS) 0.9 % injection 10 mL  10 mL Intracatheter PRN Megan Bears, MD        SURGICAL HISTORY:  Past Surgical History:  Procedure Laterality Date   CARDIAC CATHETERIZATION  07/06/2010   see CABG report - pt sent to OR   CARDIOVASCULAR STRESS TEST  09/11/2010   R/S MV - normal perfusion in all regions, EF 46%, no scintigraphic evidence of inducible myocardial ischemia; global LV systolic function mildly reduced; no significant wall motion abnormalities noted; Exercise capacity 7 METS; EKG negative for ischemia; low risk  study, no signifcant change from previous study 08/2003   CORONARY ARTERY BYPASS GRAFT  07/11/2010   LIMA to LAD; SVG to 2nd branch OM; SVG to posterior descending artery   IR IMAGING GUIDED PORT INSERTION  08/16/2021   LEFT VATS  09/17/2010   TEE WITHOUT CARDIOVERSION  07/06/2010   during emergent CABG surgery; 2-3+ mitral regurgitation   VIDEO BRONCHOSCOPY N/A 07/27/2021   Procedure: VIDEO BRONCHOSCOPY;  Surgeon: Melrose Nakayama, MD;  Location: [facility];  Service: Thoracic;  Laterality: N/A;   VIDEO BRONCHOSCOPY WITH ENDOBRONCHIAL ULTRASOUND N/A 07/27/2021   Procedure: VIDEO BRONCHOSCOPY WITH ENDOBRONCHIAL ULTRASOUND;  Surgeon: Melrose Nakayama, MD;  Location: [facility];  Service: Thoracic;  Laterality: N/A;    REVIEW OF SYSTEMS:   Review of Systems  Constitutional:  Positive for baseline fatigue. Negative for appetite change, chills, fever and unexpected weight change.  HENT: Positive for nasal drainage from allergies. Negative for mouth sores, nosebleeds, sore throat and trouble swallowing.   Eyes: Negative for eye problems and icterus.  Respiratory: Positive for baseline dyspnea on exertion. Negative for cough, hemoptysis, and wheezing.   Cardiovascular: Negative for chest pain and leg swelling.  Gastrointestinal: Negative for abdominal pain, constipation, diarrhea, nausea and vomiting.  Genitourinary: Negative for bladder incontinence, difficulty urinating, dysuria, frequency and hematuria.   Musculoskeletal: Negative for back pain, gait problem, neck pain and neck stiffness.  Skin: Negative for itching and rash.  Neurological: Positive for lightheadedness with standing occasionally. Negative for dizziness, extremity weakness, gait problem, headaches, and seizures.  Hematological: Negative for adenopathy. Does not bruise/bleed easily.  Psychiatric/Behavioral: Negative for confusion, depression and sleep disturbance. The patient is not nervous/anxious.     PHYSICAL EXAMINATION:  Blood  pressure 125/63, pulse 70, temperature 99.4 F (37.4 C), temperature source Oral, resp. rate 16, weight 213 lb 9.6 oz (96.9 kg), SpO2 95 %.  ECOG PERFORMANCE STATUS: 2  Physical Exam  Constitutional: Oriented to person, place, and time and well-developed, well-nourished, and in no distress.  HENT:  Head: Normocephalic and atraumatic.  Mouth/Throat: Oropharynx is clear and moist. No oropharyngeal exudate.  Eyes: Conjunctivae are normal. Right eye exhibits no discharge. Left eye exhibits no discharge. No scleral icterus.  Neck: Normal range of motion. Neck supple.  Cardiovascular: Normal rate, regular rhythm, normal heart sounds and intact distal pulses.   Pulmonary/Chest: Effort normal. No respiratory distress. Mild expiratory wheezing. No rales.  On supplemental oxygen via nasal cannula.   Abdominal: Soft. Bowel sounds are normal. Exhibits no distension and no mass. There is no tenderness.  Musculoskeletal: Normal range of motion. Exhibits no edema.  Lymphadenopathy:    No cervical adenopathy.  Neurological: Alert and oriented to person, place, and time. Exhibits also wasting.  Examined in the wheelchair.  Skin: Skin is warm and dry. No rash noted. Not diaphoretic. No erythema. No pallor.  Psychiatric: Mood, memory and judgment normal.  Vitals reviewed.  LABORATORY DATA: Lab Results  Component Value Date   WBC 7.0 12/13/2021   HGB 10.7 (L) 12/13/2021   HCT 34.3 (L) 12/13/2021   MCV 94.0 12/13/2021   PLT 221 12/13/2021      Chemistry      Component Value Date/Time   NA 141 12/13/2021 0932   NA 143 05/02/2021 1055   K 3.8 12/13/2021 0932   CL 102 12/13/2021 0932   CO2 36 (H) 12/13/2021 0932   BUN 11 12/13/2021 0932   BUN 13 05/02/2021 1055   CREATININE 0.89 12/13/2021 0932   CREATININE 0.81 07/02/2016 1323      Component Value Date/Time   CALCIUM 9.3 12/13/2021 0932   ALKPHOS 78 12/13/2021 0932   AST 28 12/13/2021 0932   ALT 31 12/13/2021 0932   BILITOT 0.3 12/13/2021  0932       RADIOGRAPHIC STUDIES:  No results found.   ASSESSMENT/PLAN:  This is a very pleasant 76 year old Caucasian female with stage IIb/IIIa (T3, and 0/N2, M0) non-small cell lung cancer, squamous of carcinoma.  She presented with a left hilar mass with suspicious mediastinal lymphadenopathy.  The patient also has a history of stage Ia non-small cell lung cancer, adenocarcinoma status post surgical resection in July 2012 as well as recurrent stage Ia non-small cell lung cancer, squamous cell carcinoma status post SBRT which was completed on 01/30/2021.  The patient completed a course of concurrent chemoradiation with weekly carboplatin for an AUC of 2 and paclitaxel 45 mg per metered square.  The patient is status post 6 cycles.  Her last dose of treatment was on 09/17/2021.  She is currently undergoing consolidation immunotherapy with Imfinzi 1500 mg IV every 4 weeks.  She is status post 2 cycles.  Her first dose of treatment was on 10/18/2021.   Labs were reviewed.  Recommend that she proceed with cycle #3 today scheduled.  I will arrange for restaging CT scan of the chest prior to starting her next cycle of treatment. We will see her back the Monday after Thanksgiving.   We will see her back for follow-up visit in 4 weeks for evaluation and repeat blood work and to review her scan results before starting cycle #4.  It sounds like her lightheadedness is related to orthostatic hypotension.  Her symptoms may occur if she stands up/changes positions.  She denies any headache, speech changes, extremity weakness, nausea, vomiting, vision changes, or balance changes.  Her symptoms resolve if she sits down.  The patient knows to change positions slowly and to sit down if she ever feels like she may pass out.  If she ever feels lightheaded, encouraged to hydrate more and to monitor her pulse and blood pressure at home when the symptoms occur.  She may consider reaching back out to her  cardiologist to see if further dose adjustments for her cardiac medicines as needed.   The patient was advised to call immediately if she has any concerning symptoms in the interval. The patient voices understanding of current disease status and treatment options and is in agreement with the current care plan. All questions were answered. The patient knows to call the clinic with any problems, questions or concerns. We can certainly see the patient much sooner if necessary        Orders Placed This Encounter  Procedures   CT Chest W Contrast    Standing Status:   Future    Standing Expiration Date:   12/13/2022    Order Specific Question:   If indicated for the ordered procedure, I authorize the administration of contrast media per Radiology protocol    Answer:   Yes    Order Specific Question:   Preferred imaging location?    Answer:   [facility]     The total time spent in the appointment was [place], PA-C 12/13/21

## 2021-12-13 ENCOUNTER — Inpatient Hospital Stay: Payer: Medicare HMO

## 2021-12-13 ENCOUNTER — Inpatient Hospital Stay: Payer: Medicare HMO | Attending: Internal Medicine | Admitting: Physician Assistant

## 2021-12-13 VITALS — BP 109/60 | HR 70 | Temp 97.7°F | Resp 16

## 2021-12-13 VITALS — BP 125/63 | HR 70 | Temp 99.4°F | Resp 16 | Wt 213.6 lb

## 2021-12-13 DIAGNOSIS — I951 Orthostatic hypotension: Secondary | ICD-10-CM | POA: Insufficient documentation

## 2021-12-13 DIAGNOSIS — Z9981 Dependence on supplemental oxygen: Secondary | ICD-10-CM | POA: Diagnosis not present

## 2021-12-13 DIAGNOSIS — Z5112 Encounter for antineoplastic immunotherapy: Secondary | ICD-10-CM | POA: Diagnosis not present

## 2021-12-13 DIAGNOSIS — Z923 Personal history of irradiation: Secondary | ICD-10-CM | POA: Insufficient documentation

## 2021-12-13 DIAGNOSIS — C3492 Malignant neoplasm of unspecified part of left bronchus or lung: Secondary | ICD-10-CM

## 2021-12-13 DIAGNOSIS — Z79899 Other long term (current) drug therapy: Secondary | ICD-10-CM | POA: Insufficient documentation

## 2021-12-13 DIAGNOSIS — Z95828 Presence of other vascular implants and grafts: Secondary | ICD-10-CM

## 2021-12-13 DIAGNOSIS — R0609 Other forms of dyspnea: Secondary | ICD-10-CM | POA: Insufficient documentation

## 2021-12-13 LAB — CMP (CANCER CENTER ONLY)
ALT: 31 U/L (ref 0–44)
AST: 28 U/L (ref 15–41)
Albumin: 3.7 g/dL (ref 3.5–5.0)
Alkaline Phosphatase: 78 U/L (ref 38–126)
Anion gap: 3 — ABNORMAL LOW (ref 5–15)
BUN: 11 mg/dL (ref 8–23)
CO2: 36 mmol/L — ABNORMAL HIGH (ref 22–32)
Calcium: 9.3 mg/dL (ref 8.9–10.3)
Chloride: 102 mmol/L (ref 98–111)
Creatinine: 0.89 mg/dL (ref 0.44–1.00)
GFR, Estimated: >60 mL/min (ref >60)
Glucose, Bld: 84 mg/dL (ref 70–99)
Potassium: 3.8 mmol/L (ref 3.5–5.1)
Sodium: 141 mmol/L (ref 135–145)
Total Bilirubin: 0.3 mg/dL (ref 0.3–1.2)
Total Protein: 7 g/dL (ref 6.5–8.1)

## 2021-12-13 LAB — CBC WITH DIFFERENTIAL (CANCER CENTER ONLY)
Abs Immature Granulocytes: 0.05 10*3/uL (ref 0.00–0.07)
Basophils Absolute: 0.1 10*3/uL (ref 0.0–0.1)
Basophils Relative: 1 %
Eosinophils Absolute: 0.2 10*3/uL (ref 0.0–0.5)
Eosinophils Relative: 2 %
HCT: 34.3 % — ABNORMAL LOW (ref 36.0–46.0)
Hemoglobin: 10.7 g/dL — ABNORMAL LOW (ref 12.0–15.0)
Immature Granulocytes: 1 %
Lymphocytes Relative: 18 %
Lymphs Abs: 1.3 10*3/uL (ref 0.7–4.0)
MCH: 29.3 pg (ref 26.0–34.0)
MCHC: 31.2 g/dL (ref 30.0–36.0)
MCV: 94 fL (ref 80.0–100.0)
Monocytes Absolute: 1.1 10*3/uL — ABNORMAL HIGH (ref 0.1–1.0)
Monocytes Relative: 15 %
Neutro Abs: 4.4 10*3/uL (ref 1.7–7.7)
Neutrophils Relative %: 63 %
Platelet Count: 221 10*3/uL (ref 150–400)
RBC: 3.65 MIL/uL — ABNORMAL LOW (ref 3.87–5.11)
RDW: 14.4 % (ref 11.5–15.5)
WBC Count: 7 10*3/uL (ref 4.0–10.5)
nRBC: 0 % (ref 0.0–0.2)

## 2021-12-13 LAB — TSH: TSH: 5.198 u[IU]/mL — ABNORMAL HIGH (ref 0.350–4.500)

## 2021-12-13 MED ORDER — HEPARIN SOD (PORK) LOCK FLUSH 100 UNIT/ML IV SOLN
500.0000 [IU] | Freq: Once | INTRAVENOUS | Status: AC | PRN
  Administered 2021-12-13: 500 [IU]

## 2021-12-13 MED ORDER — SODIUM CHLORIDE 0.9 % IV SOLN
1500.0000 mg | Freq: Once | INTRAVENOUS | Status: AC
  Administered 2021-12-13: 1500 mg via INTRAVENOUS
  Filled 2021-12-13: qty 30

## 2021-12-13 MED ORDER — SODIUM CHLORIDE 0.9% FLUSH
10.0000 mL | INTRAVENOUS | Status: DC | PRN
  Administered 2021-12-13: 10 mL

## 2021-12-13 MED ORDER — SODIUM CHLORIDE 0.9 % IV SOLN: Freq: Once | INTRAVENOUS | Status: AC

## 2021-12-13 MED ORDER — SODIUM CHLORIDE 0.9% FLUSH
10.0000 mL | Freq: Once | INTRAVENOUS | Status: AC
  Administered 2021-12-13: 10 mL

## 2021-12-13 NOTE — Patient Instructions (Signed)
[facility] ONCOLOGY  Discharge Instructions: Thank you for choosing [facility] to provide your oncology and hematology care.   If you have a lab appointment with the [facility], please go directly to the [facility] and check in at the registration area.   Wear comfortable clothing and clothing appropriate for easy access to any Portacath or PICC line.   We strive to give you quality time with your provider. You may need to reschedule your appointment if you arrive late (15 or more minutes).  Arriving late affects you and other patients whose appointments are after yours.  Also, if you miss three or more appointments without notifying the office, you may be dismissed from the clinic at the provider's discretion.      For prescription refill requests, have your pharmacy contact our office and allow 72 hours for refills to be completed.    Today you received the following chemotherapy and/or immunotherapy agents: durvalumab      To help prevent nausea and vomiting after your treatment, we encourage you to take your nausea medication as directed.  BELOW ARE SYMPTOMS THAT SHOULD BE REPORTED IMMEDIATELY: *FEVER GREATER THAN 100.4 F (38 C) OR HIGHER *CHILLS OR SWEATING *NAUSEA AND VOMITING THAT IS NOT CONTROLLED WITH YOUR NAUSEA MEDICATION *UNUSUAL SHORTNESS OF BREATH *UNUSUAL BRUISING OR BLEEDING *URINARY PROBLEMS (pain or burning when urinating, or frequent urination) *BOWEL PROBLEMS (unusual diarrhea, constipation, pain near the anus) TENDERNESS IN MOUTH AND THROAT WITH OR WITHOUT PRESENCE OF ULCERS (sore throat, sores in mouth, or a toothache) UNUSUAL RASH, SWELLING OR PAIN  UNUSUAL VAGINAL DISCHARGE OR ITCHING   Items with * indicate a potential emergency and should be followed up as soon as possible or go to the Emergency Department if any problems should occur.  Please show the CHEMOTHERAPY ALERT CARD or IMMUNOTHERAPY ALERT CARD at check-in to  the Emergency Department and triage nurse.  Should you have questions after your visit or need to cancel or reschedule your appointment, please contact [facility]  Dept: [phone]  and follow the prompts.  Office hours are 8:00 a.m. to 4:30 p.m. Monday - Friday. Please note that voicemails left after 4:00 p.m. may not be returned until the following business day.  We are closed weekends and major holidays. You have access to a nurse at all times for urgent questions. Please call the main number to the clinic Dept: [phone] and follow the prompts.   For any non-urgent questions, you may also contact your provider using MyChart. We now offer e-Visits for anyone 29 and older to request care online for non-urgent symptoms. For details visit [email or web address].   Also download the MyChart app! Go to the app store, search "MyChart", open the app, select [facility], and log in with your MyChart username and password.  Masks are optional in the cancer centers. If you would like for your care team to wear a mask while they are taking care of you, please let them know. You may have one support person who is at least 75 years old accompany you for your appointments.

## 2021-12-15 ENCOUNTER — Other Ambulatory Visit: Payer: Self-pay

## 2021-12-18 ENCOUNTER — Other Ambulatory Visit: Payer: Self-pay

## 2021-12-21 ENCOUNTER — Telehealth: Payer: Self-pay

## 2021-12-21 ENCOUNTER — Telehealth: Payer: Self-pay | Admitting: Internal Medicine

## 2021-12-21 NOTE — Patient Outreach (Signed)
  Care Coordination   Initial Visit Note   12/21/2021 Name: [name] MRN: [id number] DOB: September 21, 1945  [name] is a 76 y.o. year old female who sees Junius Creamer, Greer Ee, NP for primary care. I spoke with  Lonni Fix by phone today.  What matters to the patients health and wellness today?  "I am doing fine, I don't need any care coordination services at this time".    Goals Addressed               This Visit's Progress     COMPLETED: "I don't need any assistance at this time" (pt-stated)        Care Coordination Interventions: Assessed social determinant of health barriers           SDOH assessments and interventions completed:  Yes  SDOH Interventions Today    Flowsheet Row Most Recent Value  SDOH Interventions   Food Insecurity Interventions Intervention Not Indicated  Housing Interventions Intervention Not Indicated  Transportation Interventions Intervention Not Indicated        Care Coordination Interventions Activated:  Yes  Care Coordination Interventions:  Yes, provided   Follow up plan: No further intervention required.   Encounter Outcome:  Pt. Visit Completed

## 2021-12-21 NOTE — Telephone Encounter (Signed)
Called patient regarding upcoming November, December and January appointments. Patient is notified.

## 2021-12-23 NOTE — Progress Notes (Signed)
Cardiology Clinic Note   Patient Name: [name] Date of Encounter: 12/28/2021  Primary Care Provider:  [name], NP Primary Cardiologist:  Shelva Majestic, MD  Patient Profile    [name] 76 year old female presents to the clinic today for follow-up evaluation of her paroxysmal atrial fibrillation.  Past Medical History    Past Medical History:  Diagnosis Date   CAD (coronary artery disease)    Cancer ([place])    COPD (chronic obstructive pulmonary disease) ([place])    per DM note, pt denies   Dyslipidemia    GERD (gastroesophageal reflux disease)    History of radiation therapy    Left lung- 01/16/21-01/30/21- Dr. Gery Pray   History of radiation therapy    Left lung- 08/02/21-09/18/21- Dr. Gery Pray   HTN (hypertension)    Hypothyroidism    Myocardial infarct [facility])    Non-small cell lung cancer ([place]) 08/19/2010   Stage IA, status post left upper lobectomy July 2012   Tobacco abuse    Past Surgical History:  Procedure Laterality Date   CARDIAC CATHETERIZATION  07/06/2010   see CABG report - pt sent to OR   CARDIOVASCULAR STRESS TEST  09/11/2010   R/S MV - normal perfusion in all regions, EF 46%, no scintigraphic evidence of inducible myocardial ischemia; global LV systolic function mildly reduced; no significant wall motion abnormalities noted; Exercise capacity 7 METS; EKG negative for ischemia; low risk study, no signifcant change from previous study 08/2003   CORONARY ARTERY BYPASS GRAFT  07/11/2010   LIMA to LAD; SVG to 2nd branch OM; SVG to posterior descending artery   IR IMAGING GUIDED PORT INSERTION  08/16/2021   LEFT VATS  09/17/2010   TEE WITHOUT CARDIOVERSION  07/06/2010   during emergent CABG surgery; 2-3+ mitral regurgitation   VIDEO BRONCHOSCOPY N/A 07/27/2021   Procedure: VIDEO BRONCHOSCOPY;  Surgeon: Melrose Nakayama, MD;  Location: [facility] OR;  Service: Thoracic;  Laterality: N/A;   VIDEO BRONCHOSCOPY WITH ENDOBRONCHIAL  ULTRASOUND N/A 07/27/2021   Procedure: VIDEO BRONCHOSCOPY WITH ENDOBRONCHIAL ULTRASOUND;  Surgeon: Melrose Nakayama, MD;  Location: [facility] OR;  Service: Thoracic;  Laterality: N/A;    Allergies  Allergies  Allergen Reactions   Codeine Other (See Comments)    Unknown reaction Patient avoids this medication    History of Present Illness    [name] has a PMH of MI with V-fib arrest in 1996 status post PTCA of her LAD.  She underwent CABG in 2012, with prior ICM EF 35% in 2012 prior to CABG.  EF improved to 50-55% 9/22.  Her PMH also includes paroxysmal atrial fibrillation, HTN, HLD, tobacco abuse, COPD stage IV with chronic respiratory failure on home oxygen, lung cancer, anemia, and obesity.  She was seen in the emergency department on 08/07/2021 with SVT.  Her heart rate was in the 170s and resolved with metoprolol.  CXR showed opacification of left hemithorax.  Pulmonology felt it represented a complete endobronchial obstruction due to tumor with some pleural effusion.  They plan for continued outpatient management was made.  She returned to the emergency department later in the evening with palpitations.  It was initially felt that she had SVT which was not amenable to adenosine.  She was seen by cardiology who felt it was more likely atrial flutter.  Her symptoms were accompanied by fever, chills, and leukocytosis.  She was treated with IV amiodarone and converted to normal sinus rhythm.  She was admitted to  the hospital on 08/09/2021 and discharged on 08/11/2021.  She was diagnosed with lung cancer in the setting of UTI.  For her paroxysmal atrial flutter she was placed on amiodarone with a plan to take 400 mg twice daily x7 days, 200 mg twice daily for 7 days and then 200 mg thereafter.  Her metoprolol 100 mg daily was continued.  She is not anticoagulated due to concern for amyloid angiopathy which was seen on her MRI head with evidence of chronic peripheral microhemorrhage.  She was  noted to have elevated troponins which were felt to be related to demand ischemia.  She presented to the clinic 09/26/21 for follow-up evaluation and stated she was doing well with her supplemental oxygen.  She was using 3 L nasal cannula and satting in the low 90s.  She had noted some dizziness.  She reported poor hydration.  We reviewed the importance of maintaining good p.o. hydration and changing positions slowly.  We reviewed her recent hospitalization.  We also reviewed her recent labs and screening for amiodarone.  I  continued amiodarone, gave the [place] support stocking she, had her maintain her physical activity, and planned follow-up in 3 to 4 months.  She was taking chemo and radiation for lung cancer.  I planned to postpone her echocardiogram at the time.  She presents to the clinic today for follow-up evaluation and states she does note some increased fatigue and activity intolerance.  She is now on maintenance immunotherapy every month for her cancer.  She is also having CT scans every 3 months for surveillance.  She denies increased shortness of breath and continues to be on 3 L of oxygen.  She does note some inspiratory wheezes in the morning which resolved after coughing.  She was previously taking nebulizer treatments but has discontinued.  She does consume 2 cups of coffee in the morning.  Due to her fatigue and activity intolerance I will repeat her echocardiogram as well as to reevaluate her systolic heart failure.  I have asked her to increase her physical activity as tolerated, continue heart healthy low-sodium diet, and we will plan follow-up in 4 to 6 months.  Today she denies chest pain, creased shortness of breath, lower extremity edema, fatigue, palpitations, melena, hematuria, hemoptysis, diaphoresis, orthopnea, and PND.     Home Medications    Prior to Admission medications   Medication Sig Start Date End Date Taking? Authorizing Provider  acetaminophen (TYLENOL) 500 MG  tablet Take 500 mg by mouth every 6 (six) hours as needed for moderate pain or fever.    [provider]  albuterol (PROAIR HFA) 108 (90 Base) MCG/ACT inhaler 2 puffs up to every 4 hours as needed only  if your can't catch your breath Patient taking differently: Inhale 2 puffs into the lungs every 4 (four) hours as needed for shortness of breath. 03/27/21   Tanda Rockers, MD  albuterol (PROVENTIL) (2.5 MG/3ML) 0.083% nebulizer solution Take 2.5 mg by nebulization 3 (three) times daily as needed for wheezing or shortness of breath.    [provider]  amiodarone (PACERONE) 200 MG tablet Advised to take amiodarone 400 mg twice daily for 7 days followed by amiodarone 200 mg twice daily for 7 days then followed by amiodarone 200 mg daily until follow-up with cardiology. 08/25/21   [name], NP  aspirin 81 MG tablet Take 81 mg by mouth at bedtime.    [provider]  chlorpheniramine-HYDROcodone 10-8 MG/5ML Take 5 mLs by mouth  at bedtime as needed for cough. 08/02/21   [provider]  Coenzyme Q10 (CO Q 10 PO) Take 1 capsule by mouth daily.    [provider]  fluconazole (DIFLUCAN) 100 MG tablet Take 1 tablet (100 mg total) by mouth daily. Patient not taking: Reported on 09/03/2021 08/06/21   Heilingoetter, Cassandra L, PA-C  furosemide (LASIX) 20 MG tablet Take 1 tablet po BID for 5 days then go back to taking 1 tablet po QD 08/17/21   [name], NP  levothyroxine (SYNTHROID) 25 MCG tablet TAKE 0.5 TABLETS (12.5 MCG TOTAL) BY MOUTH DAILY. IN ADDITION TO THE 50 MCG. Patient taking differently: Take 12.5 mcg by mouth See admin instructions. 12.5 mcg daily except on sunday 11/17/19   Troy Sine, MD  levothyroxine (SYNTHROID) 50 MCG tablet TAKE 1 TABLET BY MOUTH EVERY DAY BEFORE BREAKFAST Patient taking differently: Take 50 mcg by mouth See admin instructions. Along with 12.5 mcg qd except on Sunday (pt just takes 50 mcg) 04/09/21   Troy Sine,  MD  lidocaine-prilocaine (EMLA) cream Apply 1 Application topically as needed. Patient taking differently: Apply 1 Application topically as needed (prior to port access). 08/06/21   Heilingoetter, Cassandra L, PA-C  metoprolol succinate (TOPROL-XL) 100 MG 24 hr tablet Take 1 tablet (100 mg total) by mouth daily. Take with or immediately following a meal. Patient taking differently: Take 100 mg by mouth daily. 12/20/20   [name], NP  Omega-3 Fatty Acids (OMEGA-3 PO) Take 1,000 mg by mouth in the morning and at bedtime.    [provider]  OXYGEN Inhale 3.5 L into the lungs continuous.    [provider]  potassium chloride SA (KLOR-CON M) 10 MEQ tablet Take 1 tablet (10 mEq total) by mouth daily. 08/17/21   [name], NP  prochlorperazine (COMPAZINE) 10 MG tablet Take 1 tablet (10 mg total) by mouth every 6 (six) hours as needed. 08/06/21   Heilingoetter, Cassandra L, PA-C  rosuvastatin (CRESTOR) 40 MG tablet Take 1 tablet (40 mg total) by mouth daily. Patient taking differently: Take 40 mg by mouth every evening. 11/29/20   Troy Sine, MD  Tiotropium Bromide-Olodaterol (STIOLTO RESPIMAT) 2.5-2.5 MCG/ACT AERS Inhale 2 puffs into the lungs daily. 08/22/21   Cobb, Karie Schwalbe, NP  Tiotropium Bromide-Olodaterol (STIOLTO RESPIMAT) 2.5-2.5 MCG/ACT AERS Inhale 2 puffs into the lungs daily. 08/22/21   Clayton Bibles, NP    Family History    Family History  Problem Relation Age of Onset   Hypothyroidism Father    Heart attack Father    Aplastic anemia Maternal Grandfather    Stroke Paternal Grandfather    She indicated that her mother is alive. She indicated that her father is deceased. She indicated that her sister is deceased. She indicated that her brother is alive. She indicated that the status of her maternal grandfather is unknown. She indicated that the status of her paternal grandfather is unknown.  Social History    Social History   Socioeconomic  History   Marital status: Married    Spouse name: Not on file   Number of children: Not on file   Years of education: Not on file   Highest education level: Not on file  Occupational History   Not on file  Tobacco Use   Smoking status: Former    Packs/day: 1.00    Years: 58.00    Total pack years: 58.00    Types: Cigarettes  Quit date: 07/19/2021    Years since quitting: 0.4   Smokeless tobacco: Former   Tobacco comments:    Pt states she is currently smoking  Vaping Use   Vaping Use: Never used  Substance and Sexual Activity   Alcohol use: No    Alcohol/week: 0.0 standard drinks of alcohol   Drug use: No   Sexual activity: Never  Other Topics Concern   Not on file  Social History Narrative   Not on file   Social Determinants of Health   Financial Resource Strain: Not on file  Food Insecurity: No Food Insecurity (12/21/2021)   Hunger Vital Sign    Worried About Running Out of Food in the Last Year: Never true    Ran Out of Food in the Last Year: Never true  Transportation Needs: No Transportation Needs (12/21/2021)   PRAPARE - Hydrologist (Medical): No    Lack of Transportation (Non-Medical): No  Physical Activity: Not on file  Stress: Not on file  Social Connections: Not on file  Intimate Partner Violence: Not on file     Review of Systems    General:  No chills, fever, night sweats or weight changes.  Cardiovascular:  No chest pain, dyspnea on exertion, edema, orthopnea, palpitations, paroxysmal nocturnal dyspnea. Dermatological: No rash, lesions/masses Respiratory: No cough, dyspnea Urologic: No hematuria, dysuria Abdominal:   No nausea, vomiting, diarrhea, bright red blood per rectum, melena, or hematemesis Neurologic:  No visual changes, wkns, changes in mental status. All other systems reviewed and are otherwise negative except as noted above.  Physical Exam    VS:  BP (!) 115/58 (BP Location: Left Arm, Patient Position:  Sitting, Cuff Size: Normal)   Pulse 80   Ht 5\' 3"  (1.6 m)   Wt 213 lb 6.4 oz (96.8 kg)   SpO2 94%   BMI 37.80 kg/m  , BMI Body mass index is 37.8 kg/m. GEN: Well nourished, well developed, in no acute distress. HEENT: normal. Neck: Supple, no JVD, carotid bruits, or masses. Cardiac: RRR, no murmurs, rubs, or gallops. No clubbing, cyanosis, edema.  Radials/DP/PT 2+ and equal bilaterally.  Respiratory:  Respirations regular and unlabored, clear to auscultation bilaterally. GI: Soft, nontender, nondistended, BS + x 4. MS: no deformity or atrophy. Skin: warm and dry, no rash. Neuro:  Strength and sensation are intact. Psych: Normal affect.  Accessory Clinical Findings    Recent Labs: 08/08/2021: B Natriuretic Peptide 360.1 08/11/2021: Magnesium 2.2 12/13/2021: ALT 31; BUN 11; Creatinine 0.89; Hemoglobin 10.7; Platelet Count 221; Potassium 3.8; Sodium 141; TSH 5.198   Recent Lipid Panel    Component Value Date/Time   CHOL 117 05/02/2021 1055   CHOL 114 07/22/2012 0840   TRIG 96 05/02/2021 1055   TRIG 170 (H) 07/22/2012 0840   HDL 46 05/02/2021 1055   HDL 42 07/22/2012 0840   CHOLHDL 2.5 05/02/2021 1055   CHOLHDL 3.2 06/26/2016 0859   VLDL 25 06/26/2016 0859   LDLCALC 53 05/02/2021 1055   LDLCALC 38 07/22/2012 0840    ECG personally reviewed by me today-none today.  Echocardiogram 11/14/2020  IMPRESSIONS     1. Left ventricular ejection fraction, by estimation, is 50 to 55%. Left  ventricular ejection fraction by 3D volume is 54 %. The left ventricle has  low normal function. The left ventricle has no regional wall motion  abnormalities. Left ventricular  diastolic parameters are consistent with Grade I diastolic dysfunction  (impaired relaxation). The average left  ventricular global longitudinal  strain is -16.7 %. The global longitudinal strain is mildly decreased.   2. Right ventricular systolic function is normal. The right ventricular  size is normal. Tricuspid  regurgitation signal is inadequate for assessing  PA pressure.   3. The mitral valve is normal in structure. Mild mitral valve  regurgitation. No evidence of mitral stenosis.   4. The aortic valve is tricuspid. Aortic valve regurgitation is not  visualized. No aortic stenosis is present.   5. The inferior vena cava is normal in size with greater than 50%  respiratory variability, suggesting right atrial pressure of 3 mmHg.   Comparison(s): A prior study was performed on 07/27/2019. Similar LVEF with  slight decrease in LV strain.  Assessment & Plan   1.  Paroxysmal atrial flutter-heart rate today 80 bpm.  Denies recent episodes of irregular or accelerated heartbeat.  Not a candidate for ACE due to concern for myeloid angiopathic being on MRI head without evidence of chronic peripheral microhemorrhage. Continue amiodarone, metoprolol Heart healthy low-sodium diet Avoid triggers caffeine, chocolate, EtOH, dehydration etc.-reviewed  Ischemic cardiomyopathy-stable.  Euvolemic.  Weight stable.  Does note some increased fatigue and activity intolerance.  Unclear whether this is related to EF versus sedentary lifestyle.  Previously EF noted to be 35% prior to CABG.  Post CABG her EF improved to 50-55% 2022.  Continue metoprolol, furosemide Heart healthy low-sodium diet-salty 6 reviewed Maintain physical activity as tolerated Order Echo  Coronary artery disease-denies recent chest discomfort.  Underwent previous PCI in 1996 or 1997 and received angioplasty.  6/23 admission felt  process was related to demand ischemia. Continue metoprolol, rosuvastatin, aspirin Heart healthy low-sodium diet-salty 6 given Increase physical activity as tolerated   Essential hypertension-BP today 115/58.  Has noticed some dizziness. Increase p.o. hydration, change positions slowly Continue metoprolol Increase physical activity as tolerated  Non-small cell lung cancer-following with pulmonology and  oncology. Continue current medical therapy  Disposition: Follow-up with Dr. Claiborne Billings in 4-6 months.   Jossie Ng. Vivianna Piccini NP-C     12/28/2021, 4:47 PM [place] [address] Office [phone] Fax [phone]  Notice: This dictation was prepared with Dragon dictation along with smaller phrase technology. Any transcriptional errors that result from this process are unintentional and may not be corrected upon review.  I spent 14 minutes examining this patient, reviewing medications, and using patient centered shared decision making involving her cardiac care.  Prior to her visit I spent greater than 20 minutes reviewing her past medical history,  medications, and prior cardiac tests.

## 2021-12-25 ENCOUNTER — Other Ambulatory Visit: Payer: Self-pay

## 2021-12-26 DIAGNOSIS — J9601 Acute respiratory failure with hypoxia: Secondary | ICD-10-CM | POA: Diagnosis not present

## 2021-12-28 ENCOUNTER — Encounter: Payer: Self-pay | Admitting: General Practice

## 2021-12-28 ENCOUNTER — Ambulatory Visit: Payer: Medicare HMO | Attending: General Practice | Admitting: General Practice

## 2021-12-28 VITALS — BP 115/58 | HR 80 | Ht 63.0 in | Wt 213.4 lb

## 2021-12-28 DIAGNOSIS — I10 Essential (primary) hypertension: Secondary | ICD-10-CM | POA: Diagnosis not present

## 2021-12-28 DIAGNOSIS — I255 Ischemic cardiomyopathy: Secondary | ICD-10-CM

## 2021-12-28 DIAGNOSIS — I2510 Atherosclerotic heart disease of native coronary artery without angina pectoris: Secondary | ICD-10-CM

## 2021-12-28 DIAGNOSIS — Z85118 Personal history of other malignant neoplasm of bronchus and lung: Secondary | ICD-10-CM

## 2021-12-28 DIAGNOSIS — I2583 Coronary atherosclerosis due to lipid rich plaque: Secondary | ICD-10-CM | POA: Diagnosis not present

## 2021-12-28 DIAGNOSIS — I480 Paroxysmal atrial fibrillation: Secondary | ICD-10-CM | POA: Diagnosis not present

## 2021-12-28 NOTE — Patient Instructions (Signed)
Medication Instructions:  Your physician recommends that you continue on your current medications as directed. Please refer to the Current Medication list given to you today. *If you need a refill on your cardiac medications before your next appointment, please call your pharmacy*   Lab Work: None ordered   Testing/Procedures: Your physician has requested that you have an echocardiogram. Echocardiography is a painless test that uses sound waves to create images of your heart. It provides your doctor with information about the size and shape of your heart and how well your heart's chambers and valves are working. This procedure takes approximately one hour. There are no restrictions for this procedure. Please do NOT wear cologne, perfume, aftershave, or lotions (deodorant is allowed). Please arrive 15 minutes prior to your appointment time.   Follow-Up: At [facility], you and your health needs are our priority.  As part of our continuing mission to provide you with exceptional heart care, we have created designated Provider Care Teams.  These Care Teams include your primary Cardiologist (physician) and Advanced Practice Providers (APPs -  Physician Assistants and Nurse Practitioners) who all work together to provide you with the care you need, when you need it.  We recommend signing up for the patient portal called "MyChart".  Sign up information is provided on this After Visit Summary.  MyChart is used to connect with patients for Virtual Visits (Telemedicine).  Patients are able to view lab/test results, encounter notes, upcoming appointments, etc.  Non-urgent messages can be sent to your provider as well.   To learn more about what you can do with MyChart, go to [email or web address].    Your next appointment:   4 month(s)  The format for your next appointment:   In Person  Provider:   Shelva Majestic, MD  or Coletta Memos, NP   Other Instructions   Important Information  About [employer]

## 2021-12-30 ENCOUNTER — Other Ambulatory Visit: Payer: Self-pay

## 2022-01-03 ENCOUNTER — Ambulatory Visit (INDEPENDENT_AMBULATORY_CARE_PROVIDER_SITE_OTHER): Payer: Medicare HMO | Admitting: Nurse Practitioner

## 2022-01-03 ENCOUNTER — Encounter: Payer: Self-pay | Admitting: Nurse Practitioner

## 2022-01-03 ENCOUNTER — Ambulatory Visit: Payer: Medicare HMO | Admitting: Nurse Practitioner

## 2022-01-03 VITALS — BP 94/64 | HR 69 | Ht 64.0 in | Wt 212.0 lb

## 2022-01-03 DIAGNOSIS — J9611 Chronic respiratory failure with hypoxia: Secondary | ICD-10-CM | POA: Diagnosis not present

## 2022-01-03 DIAGNOSIS — J449 Chronic obstructive pulmonary disease, unspecified: Secondary | ICD-10-CM

## 2022-01-03 DIAGNOSIS — J9612 Chronic respiratory failure with hypercapnia: Secondary | ICD-10-CM | POA: Diagnosis not present

## 2022-01-03 DIAGNOSIS — C3492 Malignant neoplasm of unspecified part of left bronchus or lung: Secondary | ICD-10-CM

## 2022-01-03 NOTE — Progress Notes (Signed)
@Patient  ID: Lonni Fix, female    DOB: [birth year], 76 y.o.   MRN: [id number]  Chief Complaint  Patient presents with   Follow-up    Pt f/u she is feeling okay, breathing is okay. She says she hurts under her left breast since she had her biopsy. Scheduled for an ECHO 12/4    Referring provider: [name], NP  HPI: 76 year old female, active smoker followed for COPD, left recurrent lung cancer, left lung collapse.  She is a patient Dr. Gustavus Bryant and last seen in office on 10/03/2021 by [facility] NP.  Past medical history significant for CAD, CHF, PAF, hypertension, GERD, hypothyroidism, IDA, HLD.  She has a history of left upper lobe adenocarcinoma post lobectomy 2012.  Found to have an enlarging left lower lobe nodule in 2022 and underwent SBRT with resolution.  In May 2023, she was found to have a hypermetabolic large left hilar mass with associated left-sided volume loss.  She underwent bronchoscopy with Dr. Roxan Hockey on 6/9 and was diagnosed with recurrent non-small cell cancer.  The following day she was admitted to [facility] for leg swelling and shortness of breath.  She was found to have a large left-sided pleural effusion which chest tube was placed for.  Effusion did not resolve despite drainage and diuresis.  She was started on steroids and discharged with plans to see radiation oncology the same day.  Radiation was started 6/15 and was repeated daily with plans to start chemotherapy in near future.  She presented to radiation therapy on 6/20 and was noted to be tachycardic-sent to [facility] long ED and found to be in SVT.  CXR during the stay was also significant for complete opacification of the left hemithorax; no intervention.  She was treated with IV metoprolol and discharged home.  She went back to the ED on 6/21 with palpitations and hot flashes.  She was found to be in SVT again and given 2 doses of adenosine, which was unsuccessful.  She then received IV beta-blocker with rate  control and A-fib was seen; cardiology consulted.  During her most recent hospital stay, a repeat CT scan showed full left lung atelectasis, proximal left hilar mass and pleural fluid with suspected partial loculations.  PCCM was consulted to discuss possible intervention.  It was decided that there was no indication for chest tube placement as she did not have any previous improvement with this due to proximal obstruction.  There was also no role for VATS decortication.  Recommended waiting until she completed radiation and started chemotherapy to reevaluate to see if proximal obstruction had resolved/improved and if she would be a candidate for VATS or Pleurx catheter. She was discharged on 6/24 with plans to follow up outpatient.   TEST/EVENTS:  04/2015 spiro: ratio 59, FEV1 39 09/11/2020 PFTs: FVC 47, FEV1 52, ratio 78, TLC 59, DLCOcor 48 08/07/2021 CT chest with contrast: Atherosclerosis/CAD.  There is no evidence of central or segmental PE.  The known left hilar mass is poorly visualized and measuring up to 4 cm.  There is interval complete hypodense heterogeneous opacification of the left lung.  There is interval development of at least a small volume of left pleural fluid.  The right lung is clear. 10/06/2021 CT chest with contrast: some regression of large neoplasm in left lung with re-expansion, indicating positive response to therapy. Ew thick-walled cavitary lesion and LUL nodule. Fullness in the left hilar region, likely residual malignant tissue and/or LAD. Small partially loculated pleural effusion, decreased  in size, likely malignant.   08/22/2021: [name] with Xan Ingraham NP for hospital follow-up.  Since she was discharged, she has started concurrent chemoradiation.  She completed cycle 1 of carboplatin/paclitaxel and did not have any major side effects.  She started cycle 2 this week. She reports that her breathing is overall stable since discharge, possibly with slight improvement in activity tolerance.  She does not feel back to her baseline and still gets winded with daily activities such as dressing. She also has begun to cough up some clear sputum again, which is normal for her. She denies any hemoptysis, wheezing, fevers, night sweats, weight loss. She rarely uses her albuterol rescue or nebulizer, but has felt like the nebulizer helps in the past. She was also previously on Incruse but stopped it because she wasn't sure how to use it properly. She did feel like it helped her breathing though. Started on [employer] - instructed on proper use. I did educate her that this may be of little benefit right now given the proximal obstructing mass; however, hopefully if this is resolved/improved, she will notice a difference in her baseline dyspnea. She was unable to maintain saturations on pulsed O2. Instructed she will need to use 4 lpm continuous - sent best fit portable. Re-evaluate use of POC in the future. Check outpatient ABG to determine need for BiPAP/NIV   10/03/2021: OV with Aubriee Szeto NP for follow up. Since I saw her last, she has completed radiation therapy on 8/1 and cycle 6 of chemo, which will be her final. She should have upcoming CT chest next week. Today, she reports that her breathing has improved. She still has dyspnea with minimal exertion but feels as though she can do more activities such as dressing herself and eating without becoming as winded. She is feeling better overall actually. Feels like she tolerated chemo/radiation without much difficulty. She does have some occasional pain in her left breast that comes and goes, related to radiation. Her cough is at baseline, usually just dry and doesn't bother her much. She occasionally notices some wheezing. She has not been using her Stiolto regularly. She rarely uses her rescue. She continues on supplemental oxygen - using POC at 3 lpm. Not routinely monitoring her oxygen at home. Required 4 lpm on walking oximetry to maintain sats. Encouraged to  restart Stiolto daily.   01/03/2022: Today - follow up Patient presents today for follow up with her husband. She is doing better than when she is here last. Feels like her breathing has improved. Still gets short winded with longer distances and climbing. Lives a relatively sedentary lifestyle. No increased cough or chest congestion. She is now on immunotherapy. Having some trouble with orthostatic dizziness but otherwise, tolerating well. Pain on her left side from her surgery is unchanged; she was told by Dr. Roxan Hockey that it may never resolve. Switched from [employer] to [place] after our last visit due to insurance coverage. Not using consistently. No acute concerns or complaints today.   Allergies  Allergen Reactions   Codeine Other (See Comments)    Unknown reaction Patient avoids this medication    Immunization History  Administered Date(s) Administered   Pneumococcal Polysaccharide-23 01/28/2019   Tdap 02/02/2019   Zoster Recombinat (Shingrix) 06/14/2019    Past Medical History:  Diagnosis Date   CAD (coronary artery disease)    Cancer ([place])    COPD (chronic obstructive pulmonary disease) ([place])    per DM note, pt denies   Dyslipidemia    GERD (  gastroesophageal reflux disease)    History of radiation therapy    Left lung- 01/16/21-01/30/21- Dr. Gery Pray   History of radiation therapy    Left lung- 08/02/21-09/18/21- Dr. Gery Pray   HTN (hypertension)    Hypothyroidism    Myocardial infarct [facility])    Non-small cell lung cancer ([place]) 08/19/2010   Stage IA, status post left upper lobectomy July 2012   Tobacco abuse     Tobacco History: Social History   Tobacco Use  Smoking Status Former   Packs/day: 1.00   Years: 58.00   Total pack years: 58.00   Types: Cigarettes   Quit date: 07/19/2021   Years since quitting: 0.4  Smokeless Tobacco Former  Tobacco Comments   Pt states she is currently smoking   Counseling given: Not Answered Tobacco comments: Pt  states she is currently smoking   Outpatient Medications Prior to Visit  Medication Sig Dispense Refill   acetaminophen (TYLENOL) 500 MG tablet Take 500 mg by mouth every 6 (six) hours as needed for moderate pain or fever.     albuterol (PROAIR HFA) 108 (90 Base) MCG/ACT inhaler 2 puffs up to every 4 hours as needed only  if your can't catch your breath     albuterol (PROVENTIL) (2.5 MG/3ML) 0.083% nebulizer solution Take 2.5 mg by nebulization 3 (three) times daily as needed for wheezing or shortness of breath.     amiodarone (PACERONE) 100 MG tablet Take 1 tablet (100 mg total) by mouth daily. 30 tablet 6   aspirin 81 MG tablet Take 81 mg by mouth at bedtime.     Coenzyme Q10 (CO Q 10 PO) Take 1 capsule by mouth daily.     furosemide (LASIX) 20 MG tablet Take 1 tablet po BID for 5 days then go back to taking 1 tablet po QD 90 tablet 1   Glycopyrrolate-Formoterol (BEVESPI AEROSPHERE) 9-4.8 MCG/ACT AERO Inhale 2 puffs into the lungs 2 (two) times daily. (Patient taking differently: Inhale 2 puffs into the lungs daily.) 10.7 g 5   KLOR-CON M10 10 MEQ tablet TAKE 1 TABLET BY MOUTH EVERY DAY 90 tablet 1   levothyroxine (SYNTHROID) 25 MCG tablet TAKE 0.5 TABLETS (12.5 MCG TOTAL) BY MOUTH DAILY. IN ADDITION TO THE 50 MCG. (Patient taking differently: Take 12.5 mcg by mouth See admin instructions. 12.5 mcg daily except on sunday) 45 tablet 1   levothyroxine (SYNTHROID) 50 MCG tablet TAKE 1 TABLET BY MOUTH EVERY DAY BEFORE BREAKFAST (Patient taking differently: Take 50 mcg by mouth See admin instructions. Along with 12.5 mcg qd except on Sunday (pt just takes 50 mcg)) 90 tablet 3   metoprolol succinate (TOPROL-XL) 100 MG 24 hr tablet Take 1 tablet (100 mg total) by mouth daily. Take with or immediately following a meal. 90 tablet 1   naproxen sodium (ALEVE) 220 MG tablet Take 220 mg by mouth as needed.     Omega-3 Fatty Acids (OMEGA-3 PO) Take 1,000 mg by mouth in the morning and at bedtime.     OXYGEN  Inhale 3.5 L into the lungs continuous.     Phenylephrine-Acetaminophen (TYLENOL SINUS CONGESTION/PAIN PO) Take by mouth daily as needed.     rosuvastatin (CRESTOR) 40 MG tablet Take 1 tablet (40 mg total) by mouth daily. 90 tablet 3   lidocaine-prilocaine (EMLA) cream Apply 1 Application topically as needed. (Patient not taking: Reported on 01/03/2022) 30 g 2   prochlorperazine (COMPAZINE) 10 MG tablet Take 1 tablet (10 mg total) by mouth every  6 (six) hours as needed. (Patient not taking: Reported on 01/03/2022) 30 tablet 2   No facility-administered medications prior to visit.     Review of Systems:   Constitutional: No weight loss or gain, night sweats, fevers, chills, fatigue, or lassitude. HEENT: No headaches, difficulty swallowing, tooth/dental problems, or sore throat. No sneezing, itching, ear ache, nasal congestion, or post nasal drip CV:  No chest pain, orthopnea, PND, swelling in lower extremities, anasarca, dizziness, palpitations, syncope Resp: +shortness of breath with exertion; chronic cough. No excess mucus or change in color of mucus. No hemoptysis. No wheezing.  No chest wall deformity Skin: No rash, lesions, ulcerations MSK:  No joint pain or swelling.  No decreased range of motion.  No back pain. Neuro: No dizziness or lightheadedness.  Psych: No depression or anxiety. Mood stable.     Physical Exam:  BP 94/64   Pulse 69   Ht 5\' 4"  (1.626 m)   Wt 212 lb (96.2 kg)   SpO2 97% Comment: 3L  BMI 36.39 kg/m   GEN: Pleasant, interactive, chronically-ill appearing; obese; in no acute distress. HEENT:  Normocephalic and atraumatic. PERRLA. Sclera white. Nasal turbinates pink, moist and patent bilaterally. No rhinorrhea present. Oropharynx pink and moist, without exudate or edema. No lesions, ulcerations, or postnasal drip.  NECK:  Supple w/ fair ROM. No JVD present. Normal carotid impulses w/o bruits. Thyroid symmetrical with no goiter or nodules palpated. No  lymphadenopathy.   CV: RRR, no m/r/g, no peripheral edema. Pulses intact, +2 bilaterally. No cyanosis, pallor or clubbing. PULMONARY:  Unlabored, regular breathing. Diminished bilaterally w/o wheezes/rales/rhonchi; improved airflow throughout left lung. No accessory muscle use. No dullness to percussion. GI: BS present and normoactive. Soft, non-tender to palpation.  Neuro: A/Ox3. No focal deficits noted.   Skin: Warm, no lesions or rashe Psych: Normal affect and behavior. Judgement and thought content appropriate.     Lab Results:  CBC    Component Value Date/Time   WBC 7.0 12/13/2021 0932   WBC 10.7 (H) 08/11/2021 0316   RBC 3.65 (L) 12/13/2021 0932   HGB 10.7 (L) 12/13/2021 0932   HGB 13.3 05/02/2021 1055   HCT 34.3 (L) 12/13/2021 0932   HCT 40.8 05/02/2021 1055   PLT 221 12/13/2021 0932   PLT 220 05/02/2021 1055   MCV 94.0 12/13/2021 0932   MCV 88 05/02/2021 1055   MCH 29.3 12/13/2021 0932   MCHC 31.2 12/13/2021 0932   RDW 14.4 12/13/2021 0932   RDW 13.3 05/02/2021 1055   LYMPHSABS 1.3 12/13/2021 0932   LYMPHSABS 2.2 10/25/2020 0935   MONOABS 1.1 (H) 12/13/2021 0932   EOSABS 0.2 12/13/2021 0932   EOSABS 0.1 10/25/2020 0935   BASOSABS 0.1 12/13/2021 0932   BASOSABS 0.1 10/25/2020 0935    BMET    Component Value Date/Time   NA 141 12/13/2021 0932   NA 143 05/02/2021 1055   K 3.8 12/13/2021 0932   CL 102 12/13/2021 0932   CO2 36 (H) 12/13/2021 0932   GLUCOSE 84 12/13/2021 0932   BUN 11 12/13/2021 0932   BUN 13 05/02/2021 1055   CREATININE 0.89 12/13/2021 0932   CREATININE 0.81 07/02/2016 1323   CALCIUM 9.3 12/13/2021 0932   GFRNONAA >60 12/13/2021 0932   GFRAA 98 04/04/2020 1418    BNP    Component Value Date/Time   BNP 360.1 (H) 08/08/2021 2154     Imaging:  No results found.  durvalumab (IMFINZI) 1,500 mg in sodium chloride 0.9 % 100 mL  chemo infusion     Date Action Dose Route User   11/15/2021 [phone] Rate/Dose Change (none) Intravenous Charleston Poot, RN   11/15/2021 3382 New Bag/Given 1,500 mg Intravenous Charleston Poot, RN      durvalumab (IMFINZI) 1,500 mg in sodium chloride 0.9 % 100 mL chemo infusion     Date Action Dose Route User   12/13/2021 1124 Infusion Verify (none) Intravenous Alvera Singh, RN   12/13/2021 1124 Rate/Dose Change (none) Intravenous Alvera Singh, RN   12/13/2021 1124 New Bag/Given 1,500 mg Intravenous Alvera Singh, RN      heparin lock flush 100 unit/mL     Date Action Dose Route User   11/15/2021 1048 Given 500 Units Intracatheter Charleston Poot, RN      heparin lock flush 100 unit/mL     Date Action Dose Route User   12/13/2021 1234 Given 500 Units Intracatheter Alvera Singh, RN      0.9 %  sodium chloride infusion     Date Action Dose Route User   11/15/2021 1047 Rate/Dose Change (none) Intravenous Charleston Poot, RN   11/15/2021 1042 Rate/Dose Change (none) Intravenous Charleston Poot, RN   11/15/2021 [phone] New Bag/Given (none) Intravenous Charleston Poot, RN      0.9 %  sodium chloride infusion     Date Action Dose Route User   12/13/2021 1235 Rate/Dose Change (none) Intravenous Alvera Singh, RN   12/13/2021 1230 Rate/Dose Change (none) Intravenous Alvera Singh, RN   12/13/2021 1038 New Bag/Given (none) Intravenous Alvera Singh, RN      sodium chloride flush (NS) 0.9 % injection 10 mL     Date Action Dose Route User   11/15/2021 0820 Given 10 mL Intracatheter Anastasia Pall, RN      sodium chloride flush (NS) 0.9 % injection 10 mL     Date Action Dose Route User   11/15/2021 1048 Given 10 mL Intracatheter [place] Poot, RN      sodium chloride flush (NS) 0.9 % injection 10 mL     Date Action Dose Route User   12/13/2021 0932 Given 10 mL Intracatheter Gobble, Jasmine A, LPN      sodium chloride flush (NS) 0.9 % injection 10 mL     Date Action Dose Route User   12/13/2021  1234 Given 10 mL Intracatheter Alvera Singh, RN          Latest Ref Rng & Units 09/11/2020   12:46 PM  PFT Results  FVC-Pre L 1.38   FVC-Predicted Pre % 47   FVC-Post L 1.44   FVC-Predicted Post % 50   Pre FEV1/FVC % % 83   Post FEV1/FCV % % 78   FEV1-Pre L 1.14   FEV1-Predicted Pre % 52   FEV1-Post L 1.12   DLCO uncorrected ml/min/mmHg 9.54   DLCO UNC% % 49   DLCO corrected ml/min/mmHg 9.38   DLCO COR %Predicted % 48   DLVA Predicted % 83   TLC L 3.02   TLC % Predicted % 59   RV % Predicted % 61     No results found for: "NITRICOXIDE"      Assessment & Plan:   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 She completed chemo and radiation in August with significant clinical and radiographic improvement. She is now on consolidation  immunotherapy with Imfinzi. Tolerating well aside from some occasional postural dizziness. She has surveillance CT scheduled for 11/20. Follow up with oncology as scheduled.   Patient Instructions  Restart Bevespi 2 puffs twice a day  Continue Albuterol inhaler 2 puffs or 3 mL neb every 6 hours as needed for shortness of breath or wheezing. Notify if symptoms persist despite rescue inhaler/neb use. Continue supplemental oxygen 3 lpm for goal >88-90%    Attend CT chest as scheduled by your oncologist. Follow up with oncology as scheduled.    Follow up in 3 months with Dr. Melvyn Novas or Alanson Aly. If symptoms do not improve or worsen, please contact office for sooner follow up or seek emergency care.   COPD GOLD III Severe COPD at baseline with FEV1 in 2017 39%. She does feel like her dyspnea has improved. She has not been using her Bevespi regularly. Re-educated on importance of maintenance therapy. We also reviewed proper inhaler method. Continue PRN albuterol.    Chronic respiratory failure with hypoxia and hypercapnia  (HCC) Stable with decreased O2 requirement since her visit in August. She has been able to m   I spent 28 minutes of dedicated to the care of this patient on the date of this encounter to include pre-visit review of records, face-to-face time with the patient discussing conditions above, post visit ordering of testing, clinical documentation with the electronic health record, making appropriate referrals as documented, and communicating necessary findings to members of the patients care team.  Clayton Bibles, NP 01/03/2022  Pt aware and understands NP's role.

## 2022-01-03 NOTE — Assessment & Plan Note (Signed)
She was hospitalized in June for complete left lung atelectasis due to obstructing hilar mass. She completed chemo and radiation in August with significant clinical and radiographic improvement. She is now on consolidation immunotherapy with Imfinzi. Tolerating well aside from some occasional postural dizziness. She has surveillance CT scheduled for 11/20. Follow up with oncology as scheduled.   Patient Instructions  Restart Bevespi 2 puffs twice a day  Continue Albuterol inhaler 2 puffs or 3 mL neb every 6 hours as needed for shortness of breath or wheezing. Notify if symptoms persist despite rescue inhaler/neb use. Continue supplemental oxygen 3 lpm for goal >88-90%    Attend CT chest as scheduled by your oncologist. Follow up with oncology as scheduled.    Follow up in 3 months with Dr. Melvyn Novas or Alanson Aly. If symptoms do not improve or worsen, please contact office for sooner follow up or seek emergency care.

## 2022-01-03 NOTE — Patient Instructions (Addendum)
Restart Bevespi 2 puffs twice a day  Continue Albuterol inhaler 2 puffs or 3 mL neb every 6 hours as needed for shortness of breath or wheezing. Notify if symptoms persist despite rescue inhaler/neb use. Continue supplemental oxygen 3 lpm for goal >88-90%    Attend CT chest as scheduled by your oncologist. Follow up with oncology as scheduled.    Follow up in 3 months with Dr. Melvyn Novas or Alanson Aly. If symptoms do not improve or worsen, please contact office for sooner follow up or seek emergency care.

## 2022-01-03 NOTE — Assessment & Plan Note (Signed)
Stable with decreased O2 requirement since her visit in August. She has been able to m

## 2022-01-03 NOTE — Assessment & Plan Note (Signed)
Severe COPD at baseline with FEV1 in 2017 39%. She does feel like her dyspnea has improved. She has not been using her Bevespi regularly. Re-educated on importance of maintenance therapy. We also reviewed proper inhaler method. Continue PRN albuterol.

## 2022-01-05 NOTE — Progress Notes (Signed)
[facility] OFFICE PROGRESS NOTE  [name], NP [place] [address]  DIAGNOSIS: Stage II/IIIa (T3, N0/N2, M0) non-small cell lung cancer, squamous cell carcinoma presented with left hilar mass with suspicious mediastinal lymphadenopathy on the last CT scan of the chest at [facility].   PRIOR THERAPY: A course of concurrent chemoradiation with weekly carboplatin for AUC of 2 and paclitaxel 45 Mg/M2.  Status post 6 cycles.  Last dose 09/17/2021   CURRENT THERAPY: Consolidation immunotherapy with Imfinzi 1500 mg IV every 4 weeks.  First dose expected on 10/18/2021. Status post 4 cycles.    INTERVAL HISTORY: [name] 76 y.o. female returns to the clinic today for a follow-up visit accompanied by her husband.  The patient was last seen 1 month ago by myself.   The patient is currently undergoing consolidation immunotherapy with Imfinzi.  She is tolerating it well without any concerning adverse side effects.  Since last being seen she denies any changes in her health.  She denies any fever, chills, drenching night sweats, or unexplained weight loss. She reports baseline dyspnea on exertion for which she is on supplemental oxygen. She reports baseline cough, especially in the AM after her mucus has settled. She has been taking tylenol sinus at night. She thinks this is making her nose run. She also reports insomnia due to her husband getting up and down a lot at night. She reports wheezing. She states she does have a pulmonologist who she say recently. She uses her inhaler BID for wheezing. Denies chest pain or hemoptysis. Denies headaches, visual changes. She denies any nausea, vomiting, diarrhea, or constipation. She denies any abdominal pain.   She recently had a restaging CT scan performed. She is here today for evaluation and to review her scan before undergoing cycle #4   MEDICAL HISTORY: Past Medical History:  Diagnosis Date   CAD  (coronary artery disease)    Cancer ([place])    COPD (chronic obstructive pulmonary disease) ([place])    per DM note, pt denies   Dyslipidemia    GERD (gastroesophageal reflux disease)    History of radiation therapy    Left lung- 01/16/21-01/30/21- Dr. Gery Pray   History of radiation therapy    Left lung- 08/02/21-09/18/21- Dr. Gery Pray   HTN (hypertension)    Hypothyroidism    Myocardial infarct [facility])    Non-small cell lung cancer ([place]) 08/19/2010   Stage IA, status post left upper lobectomy July 2012   Tobacco abuse     ALLERGIES:  is allergic to codeine.  MEDICATIONS:  Current Outpatient Medications  Medication Sig Dispense Refill   acetaminophen (TYLENOL) 500 MG tablet Take 500 mg by mouth every 6 (six) hours as needed for moderate pain or fever.     albuterol (PROAIR HFA) 108 (90 Base) MCG/ACT inhaler 2 puffs up to every 4 hours as needed only  if your can't catch your breath     albuterol (PROVENTIL) (2.5 MG/3ML) 0.083% nebulizer solution Take 2.5 mg by nebulization 3 (three) times daily as needed for wheezing or shortness of breath.     amiodarone (PACERONE) 100 MG tablet TAKE 1 TABLET BY MOUTH EVERY DAY 90 tablet 3   aspirin 81 MG tablet Take 81 mg by mouth at bedtime.     Coenzyme Q10 (CO Q 10 PO) Take 1 capsule by mouth daily.     furosemide (LASIX) 20 MG tablet Take 1 tablet po BID for 5  days then go back to taking 1 tablet po QD 90 tablet 1   Glycopyrrolate-Formoterol (BEVESPI AEROSPHERE) 9-4.8 MCG/ACT AERO Inhale 2 puffs into the lungs 2 (two) times daily. (Patient taking differently: Inhale 2 puffs into the lungs daily.) 10.7 g 5   KLOR-CON M10 10 MEQ tablet TAKE 1 TABLET BY MOUTH EVERY DAY 90 tablet 1   levothyroxine (SYNTHROID) 25 MCG tablet Take 0.5 tablets (12.5 mcg total) by mouth daily. In addition to the 50 mcg. 45 tablet 1   levothyroxine (SYNTHROID) 50 MCG tablet Take 1 tablet (50 mcg total) by mouth See admin instructions. Along with 12.5 mcg qd except on  Sunday (pt just takes 50 mcg) 90 tablet 1   lidocaine-prilocaine (EMLA) cream Apply 1 Application topically as needed. 30 g 2   metoprolol succinate (TOPROL-XL) 100 MG 24 hr tablet Take 1 tablet (100 mg total) by mouth daily. Take with or immediately following a meal. 90 tablet 1   naproxen sodium (ALEVE) 220 MG tablet Take 220 mg by mouth as needed.     Omega-3 Fatty Acids (OMEGA-3 PO) Take 1,000 mg by mouth in the morning and at bedtime.     OXYGEN Inhale 3.5 L into the lungs continuous.     Phenylephrine-Acetaminophen (TYLENOL SINUS CONGESTION/PAIN PO) Take by mouth daily as needed.     prochlorperazine (COMPAZINE) 10 MG tablet Take 1 tablet (10 mg total) by mouth every 6 (six) hours as needed. 30 tablet 2   rosuvastatin (CRESTOR) 40 MG tablet Take 1 tablet (40 mg total) by mouth daily. 90 tablet 3   No current facility-administered medications for this visit.    SURGICAL HISTORY:  Past Surgical History:  Procedure Laterality Date   CARDIAC CATHETERIZATION  07/06/2010   see CABG report - pt sent to OR   CARDIOVASCULAR STRESS TEST  09/11/2010   R/S MV - normal perfusion in all regions, EF 46%, no scintigraphic evidence of inducible myocardial ischemia; global LV systolic function mildly reduced; no significant wall motion abnormalities noted; Exercise capacity 7 METS; EKG negative for ischemia; low risk study, no signifcant change from previous study 08/2003   CORONARY ARTERY BYPASS GRAFT  07/11/2010   LIMA to LAD; SVG to 2nd branch OM; SVG to posterior descending artery   IR IMAGING GUIDED PORT INSERTION  08/16/2021   LEFT VATS  09/17/2010   TEE WITHOUT CARDIOVERSION  07/06/2010   during emergent CABG surgery; 2-3+ mitral regurgitation   VIDEO BRONCHOSCOPY N/A 07/27/2021   Procedure: VIDEO BRONCHOSCOPY;  Surgeon: Melrose Nakayama, MD;  Location: [facility];  Service: Thoracic;  Laterality: N/A;   VIDEO BRONCHOSCOPY WITH ENDOBRONCHIAL ULTRASOUND N/A 07/27/2021   Procedure: VIDEO BRONCHOSCOPY  WITH ENDOBRONCHIAL ULTRASOUND;  Surgeon: Melrose Nakayama, MD;  Location: [facility];  Service: Thoracic;  Laterality: N/A;    REVIEW OF SYSTEMS:   Review of Systems  Constitutional: Positive for baseline fatigue. Negative for appetite change, chills, fever and unexpected weight change.  HENT: Positive for nasal drainage from allergies. Negative for mouth sores, nosebleeds, sore throat and trouble swallowing.   Eyes: Negative for eye problems and icterus.  Respiratory: Positive for baseline dyspnea on exertion and wheezing. Negative for cough or hemoptysis.  Cardiovascular: Negative for chest pain and leg swelling.  Gastrointestinal: Negative for abdominal pain, constipation, diarrhea, nausea and vomiting.  Genitourinary: Negative for bladder incontinence, difficulty urinating, dysuria, frequency and hematuria.   Musculoskeletal: Negative for back pain, gait problem, neck pain and neck stiffness.  Skin: Negative for itching  and rash.  Neurological: Positive for lightheadedness with standing occasionally. Negative for dizziness, extremity weakness, gait problem, headaches, and seizures.  Hematological: Negative for adenopathy. Does not bruise/bleed easily.  Psychiatric/Behavioral: Negative for confusion, depression and sleep disturbance. The patient is not nervous/anxious.     PHYSICAL EXAMINATION:  Blood pressure 116/68, pulse 76, temperature 100.2 F (37.9 C), temperature source Oral, resp. rate 18, weight 216 lb (98 kg), SpO2 96 %.  ECOG PERFORMANCE STATUS: 2  Physical Exam  Constitutional: Oriented to person, place, and time and well-developed, well-nourished, and in no distress.  HENT:  Head: Normocephalic and atraumatic.  Mouth/Throat: Oropharynx is clear and moist. No oropharyngeal exudate.  Eyes: Conjunctivae are normal. Right eye exhibits no discharge. Left eye exhibits no discharge. No scleral icterus.  Neck: Normal range of motion. Neck supple.  Cardiovascular: Normal rate,  regular rhythm, normal heart sounds.   Pulmonary/Chest: Effort normal and breath sounds normal. No respiratory distress. On supplemental oxygen via nasal cannula.   wheezes in left lung. No rales. On supplemental oxygen.  Abdominal: Soft. Exhibits no distension and no mass. There is no tenderness.  Musculoskeletal: Normal range of motion. Exhibits no edema.  Lymphadenopathy:    No cervical adenopathy.  Neurological: Alert and oriented to person, place, and time. Exhibits muscle wasting. Gait normal. Coordination normal.  Skin: Skin is warm and dry. No rash noted. Not diaphoretic. No erythema. No pallor.  Psychiatric: Mood, memory and judgment normal.  Vitals reviewed.  LABORATORY DATA: Lab Results  Component Value Date   WBC 8.4 01/14/2022   HGB 11.2 (L) 01/14/2022   HCT 36.4 01/14/2022   MCV 91.7 01/14/2022   PLT 244 01/14/2022      Chemistry      Component Value Date/Time   NA 142 01/14/2022 0925   NA 143 05/02/2021 1055   K 3.8 01/14/2022 0925   CL 103 01/14/2022 0925   CO2 35 (H) 01/14/2022 0925   BUN 15 01/14/2022 0925   BUN 13 05/02/2021 1055   CREATININE 0.88 01/14/2022 0925   CREATININE 0.81 07/02/2016 1323      Component Value Date/Time   CALCIUM 9.6 01/14/2022 0925   ALKPHOS 78 01/14/2022 0925   AST 33 01/14/2022 0925   ALT 39 01/14/2022 0925   BILITOT 0.3 01/14/2022 0925       RADIOGRAPHIC STUDIES:  CT Chest W Contrast  Result Date: 01/08/2022 CLINICAL DATA:  A 76 year old female presents for evaluation of non-small cell lung cancer, assess treatment response. * Tracking Code: BO * EXAM: CT CHEST WITH CONTRAST TECHNIQUE: Multidetector CT imaging of the chest was performed during intravenous contrast administration. RADIATION DOSE REDUCTION: This exam was performed according to the departmental dose-optimization program which includes automated exposure control, adjustment of the mA and/or kV according to patient size and/or use of iterative reconstruction  technique. CONTRAST:  75 mL Omnipaque 300 COMPARISON:  October 06, 2021. FINDINGS: Cardiovascular: Calcified aortic atherosclerotic changes and noncalcified plaque in the thoracic aorta, not changed compared to prior imaging. Post median sternotomy for CABG. Central pulmonary arteries are unremarkable aside from distortion about the LEFT hilum and LEFT peribronchovascular soft tissue at the LEFT hilum. Mediastinum/Nodes: Peribronchovascular LEFT perihilar soft tissue is unchanged. Signs of lymphadenectomy about the LEFT hilum and mediastinum. No signs of adenopathy in the chest. Stable mild fullness of RIGHT hilar nodal tissue less than a cm. No retrocrural adenopathy. No thoracic inlet or axillary lymphadenopathy. Lungs/Pleura: Post LEFT upper lobectomy. Soft tissue about LEFT hilar structures similar to  previous imaging. LEFT-sided pleural effusion with irregular margins. Overall volume is similar. Appears more heterogeneous but in general still measures water density. Suggestion of subtle pleural based nodularity (image 83/2) 9 mm area of more focal enhancement which is away from aerated lung. Diffuse septal thickening in aerated portions of lung particularly in the lower chest which has increased since previous imaging. Upper Abdomen: Incidental imaging of upper abdominal contents shows stable low-density lesion in the LEFT hepatic lobe, likely a cyst. Gallbladder wall is slightly irregular, this is a stable finding. Mild biliary duct distension is also a stable finding. Note that the gallbladder is incompletely imaged. Imaged portions of the pancreas, spleen, adrenal glands and kidneys without acute process. Musculoskeletal: No acute bone finding. No destructive bone process. Spinal degenerative changes. IMPRESSION: 1. Post LEFT upper lobectomy. 2. LEFT-sided pleural effusion appears slightly more regular and now shows signs of irregular septal thickening. Some of this could be post treatment related but there  is a suggestion of dependent nodule in the LEFT chest raising the question of residual/recurrent disease. PET scan may be helpful or short interval follow-up as appropriate. 3. Septal thickening outlined above does not allow for exclusion of lymphangitic carcinomatosis. With reported history of radiotherapy this could also be related to post treatment changes. Would also correlate with any respiratory symptoms. 4. Gallbladder wall irregularity seen on previous imaging. Gallbladder is incompletely imaged. Correlate with any symptoms that would suggest chronic cholecystitis. Dedicated abdominal imaging could be performed as warranted. 5. Aortic atherosclerosis. Aortic Atherosclerosis (ICD10-I70.0). Electronically Signed   By: Zetta Bills M.D.   On: 01/08/2022 11:37     ASSESSMENT/PLAN:  This is a very pleasant 76 year old Caucasian female with stage IIb/IIIa (T3, and 0/N2, M0) non-small cell lung cancer, squamous of carcinoma.  She presented with a left hilar mass with suspicious mediastinal lymphadenopathy.  The patient also has a history of stage Ia non-small cell lung cancer, adenocarcinoma status post surgical resection in July 2012 as well as recurrent stage Ia non-small cell lung cancer, squamous cell carcinoma status post SBRT which was completed on 01/30/2021.    The patient completed a course of concurrent chemoradiation with weekly carboplatin for an AUC of 2 and paclitaxel 45 mg per metered square.  The patient is status post 6 cycles.  Her last dose of treatment was on 09/17/2021.  She is currently undergoing consolidation immunotherapy with Imfinzi 1500 mg IV every 4 weeks.  She is status post 3 cycles.  Her first dose of treatment was on 10/18/2021.   I will review her CT scan results with Dr. Julien Nordmann upon his return. The scan noted some irregular septal thickening which could be post radiation treatment effect in the left lung. There may be a small 9 mm dependent nodule in the left chest  which raised question for residual/recurrent disease disease and recommended short interval follow up vs PET scan. He may recommend short term follow up CT after 2 cycles of treatment. She has stable effusion. She previously had a thoracentesis. She reports her breathing is stable and is not interested in thoracentesis at this time. Of course, I did let her her that her is she feels like she needs one, she does not need to wait until her next appointment and can call us.    We will see her back for a follow up visit in 4 weeks before starting cycle #5.   Recommend she use tylenol PM for her nasal congestion and insomnia. Discussed this contains benadryl  and may help her sleep.   The patient was advised to call immediately if she has any concerning symptoms in the interval. The patient voices understanding of current disease status and treatment options and is in agreement with the current care plan. All questions were answered. The patient knows to call the clinic with any problems, questions or concerns. We can certainly see the patient much sooner if necessary        No orders of the defined types were placed in this encounter.    The total time spent in the appointment was 30-39 minutes.   Tanecia Mccay L Graeden Bitner, PA-C 01/14/22

## 2022-01-06 ENCOUNTER — Other Ambulatory Visit: Payer: Self-pay | Admitting: General Practice

## 2022-01-07 ENCOUNTER — Ambulatory Visit (HOSPITAL_COMMUNITY)
Admission: RE | Admit: 2022-01-07 | Discharge: 2022-01-07 | Disposition: A | Discharge status: Home / Self Care | Payer: Medicare HMO | Source: Ambulatory Visit | Attending: Physician Assistant | Admitting: Physician Assistant

## 2022-01-07 DIAGNOSIS — C3492 Malignant neoplasm of unspecified part of left bronchus or lung: Secondary | ICD-10-CM | POA: Diagnosis not present

## 2022-01-07 DIAGNOSIS — J90 Pleural effusion, not elsewhere classified: Secondary | ICD-10-CM | POA: Diagnosis not present

## 2022-01-07 MED ORDER — LIDOCAINE VISCOUS HCL 2 % MT SOLN
OROMUCOSAL | Status: AC
  Filled 2022-01-07: qty 15

## 2022-01-07 MED ORDER — HEPARIN SOD (PORK) LOCK FLUSH 10 UNIT/ML IV SOLN
10.0000 [IU] | Freq: Once | INTRAVENOUS | Status: AC
  Administered 2022-01-07: 10 [IU]
  Filled 2022-01-07: qty 1

## 2022-01-07 MED ORDER — IOHEXOL 300 MG/ML  SOLN
75.0000 mL | Freq: Once | INTRAMUSCULAR | Status: AC | PRN
  Administered 2022-01-07: 75 mL via INTRAVENOUS

## 2022-01-09 ENCOUNTER — Ambulatory Visit: Payer: Medicare HMO | Admitting: Physician Assistant

## 2022-01-09 ENCOUNTER — Other Ambulatory Visit: Payer: Medicare HMO

## 2022-01-09 ENCOUNTER — Encounter: Payer: Self-pay | Admitting: Nurse Practitioner

## 2022-01-09 ENCOUNTER — Other Ambulatory Visit: Payer: Self-pay | Admitting: Nurse Practitioner

## 2022-01-09 ENCOUNTER — Ambulatory Visit: Payer: Medicare HMO

## 2022-01-09 DIAGNOSIS — E039 Hypothyroidism, unspecified: Secondary | ICD-10-CM

## 2022-01-09 MED ORDER — LEVOTHYROXINE SODIUM 50 MCG PO TABS: 50.0000 ug | ORAL_TABLET | ORAL | 1 refills | Status: DC

## 2022-01-09 MED ORDER — LEVOTHYROXINE SODIUM 25 MCG PO TABS: 12.5000 ug | ORAL_TABLET | Freq: Every day | ORAL | 1 refills | Status: DC

## 2022-01-14 ENCOUNTER — Inpatient Hospital Stay: Payer: Medicare HMO | Attending: Internal Medicine | Admitting: Physician Assistant

## 2022-01-14 ENCOUNTER — Inpatient Hospital Stay: Payer: Medicare HMO

## 2022-01-14 VITALS — BP 116/68 | HR 76 | Temp 100.2°F | Resp 18 | Wt 216.0 lb

## 2022-01-14 DIAGNOSIS — C3492 Malignant neoplasm of unspecified part of left bronchus or lung: Secondary | ICD-10-CM

## 2022-01-14 DIAGNOSIS — Z5112 Encounter for antineoplastic immunotherapy: Secondary | ICD-10-CM

## 2022-01-14 DIAGNOSIS — Z95828 Presence of other vascular implants and grafts: Secondary | ICD-10-CM

## 2022-01-14 DIAGNOSIS — Z79899 Other long term (current) drug therapy: Secondary | ICD-10-CM | POA: Insufficient documentation

## 2022-01-14 DIAGNOSIS — I10 Essential (primary) hypertension: Secondary | ICD-10-CM | POA: Insufficient documentation

## 2022-01-14 LAB — CBC WITH DIFFERENTIAL (CANCER CENTER ONLY)
Abs Immature Granulocytes: 0.06 10*3/uL (ref 0.00–0.07)
Basophils Absolute: 0.1 10*3/uL (ref 0.0–0.1)
Basophils Relative: 1 %
Eosinophils Absolute: 0.1 10*3/uL (ref 0.0–0.5)
Eosinophils Relative: 1 %
HCT: 36.4 % (ref 36.0–46.0)
Hemoglobin: 11.2 g/dL — ABNORMAL LOW (ref 12.0–15.0)
Immature Granulocytes: 1 %
Lymphocytes Relative: 13 %
Lymphs Abs: 1.1 10*3/uL (ref 0.7–4.0)
MCH: 28.2 pg (ref 26.0–34.0)
MCHC: 30.8 g/dL (ref 30.0–36.0)
MCV: 91.7 fL (ref 80.0–100.0)
Monocytes Absolute: 1.4 10*3/uL — ABNORMAL HIGH (ref 0.1–1.0)
Monocytes Relative: 16 %
Neutro Abs: 5.7 10*3/uL (ref 1.7–7.7)
Neutrophils Relative %: 68 %
Platelet Count: 244 10*3/uL (ref 150–400)
RBC: 3.97 MIL/uL (ref 3.87–5.11)
RDW: 14.4 % (ref 11.5–15.5)
WBC Count: 8.4 10*3/uL (ref 4.0–10.5)
nRBC: 0 % (ref 0.0–0.2)

## 2022-01-14 LAB — CMP (CANCER CENTER ONLY)
ALT: 39 U/L (ref 0–44)
AST: 33 U/L (ref 15–41)
Albumin: 3.7 g/dL (ref 3.5–5.0)
Alkaline Phosphatase: 78 U/L (ref 38–126)
Anion gap: 4 — ABNORMAL LOW (ref 5–15)
BUN: 15 mg/dL (ref 8–23)
CO2: 35 mmol/L — ABNORMAL HIGH (ref 22–32)
Calcium: 9.6 mg/dL (ref 8.9–10.3)
Chloride: 103 mmol/L (ref 98–111)
Creatinine: 0.88 mg/dL (ref 0.44–1.00)
GFR, Estimated: >60 mL/min (ref >60)
Glucose, Bld: 99 mg/dL (ref 70–99)
Potassium: 3.8 mmol/L (ref 3.5–5.1)
Sodium: 142 mmol/L (ref 135–145)
Total Bilirubin: 0.3 mg/dL (ref 0.3–1.2)
Total Protein: 7.1 g/dL (ref 6.5–8.1)

## 2022-01-14 LAB — TSH: TSH: 5.791 u[IU]/mL — ABNORMAL HIGH (ref 0.350–4.500)

## 2022-01-14 MED ORDER — SODIUM CHLORIDE 0.9 % IV SOLN
1500.0000 mg | Freq: Once | INTRAVENOUS | Status: AC
  Administered 2022-01-14: 1500 mg via INTRAVENOUS
  Filled 2022-01-14: qty 30

## 2022-01-14 MED ORDER — SODIUM CHLORIDE 0.9% FLUSH
10.0000 mL | Freq: Once | INTRAVENOUS | Status: AC
  Administered 2022-01-14: 10 mL

## 2022-01-14 MED ORDER — SODIUM CHLORIDE 0.9 % IV SOLN: Freq: Once | INTRAVENOUS | Status: AC

## 2022-01-14 NOTE — Patient Instructions (Signed)
[facility]  Discharge Instructions: Thank you for choosing [facility] to provide your oncology and hematology care.   If you have a lab appointment with the [facility], please go directly to the [facility] and check in at the registration area.   Wear comfortable clothing and clothing appropriate for easy access to any Portacath or PICC line.   We strive to give you quality time with your provider. You may need to reschedule your appointment if you arrive late (15 or more minutes).  Arriving late affects you and other patients whose appointments are after yours.  Also, if you miss three or more appointments without notifying the office, you may be dismissed from the clinic at the provider's discretion.      For prescription refill requests, have your pharmacy contact our office and allow 72 hours for refills to be completed.    Today you received the following chemotherapy and/or immunotherapy agents imfinzi      To help prevent nausea and vomiting after your treatment, we encourage you to take your nausea medication as directed.  BELOW ARE SYMPTOMS THAT SHOULD BE REPORTED IMMEDIATELY: *FEVER GREATER THAN 100.4 F (38 C) OR HIGHER *CHILLS OR SWEATING *NAUSEA AND VOMITING THAT IS NOT CONTROLLED WITH YOUR NAUSEA MEDICATION *UNUSUAL SHORTNESS OF BREATH *UNUSUAL BRUISING OR BLEEDING *URINARY PROBLEMS (pain or burning when urinating, or frequent urination) *BOWEL PROBLEMS (unusual diarrhea, constipation, pain near the anus) TENDERNESS IN MOUTH AND THROAT WITH OR WITHOUT PRESENCE OF ULCERS (sore throat, sores in mouth, or a toothache) UNUSUAL RASH, SWELLING OR PAIN  UNUSUAL VAGINAL DISCHARGE OR ITCHING   Items with * indicate a potential emergency and should be followed up as soon as possible or go to the Emergency Department if any problems should occur.  Please show the CHEMOTHERAPY ALERT CARD or IMMUNOTHERAPY ALERT CARD at check-in to the  Emergency Department and triage nurse.  Should you have questions after your visit or need to cancel or reschedule your appointment, please contact [facility]  Dept: [phone]  and follow the prompts.  Office hours are 8:00 a.m. to 4:30 p.m. Monday - Friday. Please note that voicemails left after 4:00 p.m. may not be returned until the following business day.  We are closed weekends and major holidays. You have access to a nurse at all times for urgent questions. Please call the main number to the clinic Dept: [phone] and follow the prompts.   For any non-urgent questions, you may also contact your provider using MyChart. We now offer e-Visits for anyone 32 and older to request care online for non-urgent symptoms. For details visit [email or web address].   Also download the MyChart app! Go to the app store, search "MyChart", open the app, select [facility], and log in with your MyChart username and password.  Masks are optional in the cancer centers. If you would like for your care team to wear a mask while they are taking care of you, please let them know. You may have one support person who is at least 75 years old accompany you for your appointments.

## 2022-01-21 ENCOUNTER — Ambulatory Visit (HOSPITAL_COMMUNITY): Payer: Medicare HMO | Attending: General Practice

## 2022-01-21 DIAGNOSIS — I2583 Coronary atherosclerosis due to lipid rich plaque: Secondary | ICD-10-CM | POA: Diagnosis not present

## 2022-01-21 DIAGNOSIS — I255 Ischemic cardiomyopathy: Secondary | ICD-10-CM | POA: Diagnosis not present

## 2022-01-21 DIAGNOSIS — I2510 Atherosclerotic heart disease of native coronary artery without angina pectoris: Secondary | ICD-10-CM | POA: Insufficient documentation

## 2022-01-21 DIAGNOSIS — Z85118 Personal history of other malignant neoplasm of bronchus and lung: Secondary | ICD-10-CM | POA: Insufficient documentation

## 2022-01-21 DIAGNOSIS — I480 Paroxysmal atrial fibrillation: Secondary | ICD-10-CM | POA: Diagnosis not present

## 2022-01-21 DIAGNOSIS — I10 Essential (primary) hypertension: Secondary | ICD-10-CM | POA: Insufficient documentation

## 2022-01-21 LAB — ECHOCARDIOGRAM COMPLETE
Area-P 1/2: 3.27 cm2
S' Lateral: 2.9 cm

## 2022-01-24 ENCOUNTER — Telehealth: Payer: Self-pay | Admitting: Physician Assistant

## 2022-01-24 NOTE — Telephone Encounter (Signed)
I called the patient to review her scan.  I reviewed with Dr. Julien Nordmann today.  Dr. Julien Nordmann also recommends monitoring this closely with a repeat CT scan in 2 months as opposed to 3.  Patient expressed understanding.

## 2022-01-25 DIAGNOSIS — J9601 Acute respiratory failure with hypoxia: Secondary | ICD-10-CM | POA: Diagnosis not present

## 2022-01-28 ENCOUNTER — Inpatient Hospital Stay (HOSPITAL_COMMUNITY)
Admission: EM | Admit: 2022-01-28 | Discharge: 2022-02-08 | DRG: 189 | Disposition: A | Discharge status: Home / Self Care | Payer: Medicare HMO | Attending: Family Medicine | Admitting: Family Medicine

## 2022-01-28 ENCOUNTER — Encounter: Payer: Self-pay | Admitting: Nurse Practitioner

## 2022-01-28 ENCOUNTER — Encounter (HOSPITAL_COMMUNITY): Payer: Self-pay

## 2022-01-28 ENCOUNTER — Emergency Department (HOSPITAL_COMMUNITY): Payer: Medicare HMO

## 2022-01-28 ENCOUNTER — Other Ambulatory Visit: Payer: Self-pay

## 2022-01-28 ENCOUNTER — Ambulatory Visit (INDEPENDENT_AMBULATORY_CARE_PROVIDER_SITE_OTHER): Payer: Medicare HMO | Admitting: Nurse Practitioner

## 2022-01-28 VITALS — BP 94/52 | HR 90 | Resp 20 | Ht 63.0 in | Wt 208.0 lb

## 2022-01-28 DIAGNOSIS — Z923 Personal history of irradiation: Secondary | ICD-10-CM

## 2022-01-28 DIAGNOSIS — Z955 Presence of coronary angioplasty implant and graft: Secondary | ICD-10-CM

## 2022-01-28 DIAGNOSIS — J849 Interstitial pulmonary disease, unspecified: Secondary | ICD-10-CM | POA: Diagnosis not present

## 2022-01-28 DIAGNOSIS — I480 Paroxysmal atrial fibrillation: Secondary | ICD-10-CM | POA: Diagnosis present

## 2022-01-28 DIAGNOSIS — Z79899 Other long term (current) drug therapy: Secondary | ICD-10-CM | POA: Diagnosis not present

## 2022-01-28 DIAGNOSIS — Z9221 Personal history of antineoplastic chemotherapy: Secondary | ICD-10-CM | POA: Diagnosis not present

## 2022-01-28 DIAGNOSIS — Z8679 Personal history of other diseases of the circulatory system: Secondary | ICD-10-CM | POA: Diagnosis not present

## 2022-01-28 DIAGNOSIS — J704 Drug-induced interstitial lung disorders, unspecified: Secondary | ICD-10-CM | POA: Diagnosis not present

## 2022-01-28 DIAGNOSIS — E876 Hypokalemia: Secondary | ICD-10-CM | POA: Diagnosis present

## 2022-01-28 DIAGNOSIS — Z6837 Body mass index (BMI) 37.0-37.9, adult: Secondary | ICD-10-CM | POA: Diagnosis not present

## 2022-01-28 DIAGNOSIS — J9621 Acute and chronic respiratory failure with hypoxia: Secondary | ICD-10-CM | POA: Diagnosis not present

## 2022-01-28 DIAGNOSIS — E785 Hyperlipidemia, unspecified: Secondary | ICD-10-CM | POA: Diagnosis present

## 2022-01-28 DIAGNOSIS — F17210 Nicotine dependence, cigarettes, uncomplicated: Secondary | ICD-10-CM | POA: Diagnosis not present

## 2022-01-28 DIAGNOSIS — J449 Chronic obstructive pulmonary disease, unspecified: Secondary | ICD-10-CM | POA: Diagnosis not present

## 2022-01-28 DIAGNOSIS — D72825 Bandemia: Secondary | ICD-10-CM | POA: Diagnosis not present

## 2022-01-28 DIAGNOSIS — Z9981 Dependence on supplemental oxygen: Secondary | ICD-10-CM

## 2022-01-28 DIAGNOSIS — N179 Acute kidney failure, unspecified: Secondary | ICD-10-CM | POA: Insufficient documentation

## 2022-01-28 DIAGNOSIS — T50905A Adverse effect of unspecified drugs, medicaments and biological substances, initial encounter: Secondary | ICD-10-CM | POA: Diagnosis not present

## 2022-01-28 DIAGNOSIS — Z951 Presence of aortocoronary bypass graft: Secondary | ICD-10-CM | POA: Diagnosis not present

## 2022-01-28 DIAGNOSIS — I252 Old myocardial infarction: Secondary | ICD-10-CM | POA: Diagnosis not present

## 2022-01-28 DIAGNOSIS — I255 Ischemic cardiomyopathy: Secondary | ICD-10-CM | POA: Diagnosis not present

## 2022-01-28 DIAGNOSIS — E039 Hypothyroidism, unspecified: Secondary | ICD-10-CM | POA: Diagnosis present

## 2022-01-28 DIAGNOSIS — R9431 Abnormal electrocardiogram [ECG] [EKG]: Secondary | ICD-10-CM | POA: Diagnosis not present

## 2022-01-28 DIAGNOSIS — J9622 Acute and chronic respiratory failure with hypercapnia: Secondary | ICD-10-CM | POA: Diagnosis not present

## 2022-01-28 DIAGNOSIS — J90 Pleural effusion, not elsewhere classified: Secondary | ICD-10-CM | POA: Diagnosis not present

## 2022-01-28 DIAGNOSIS — C3490 Malignant neoplasm of unspecified part of unspecified bronchus or lung: Secondary | ICD-10-CM | POA: Diagnosis present

## 2022-01-28 DIAGNOSIS — Z1152 Encounter for screening for COVID-19: Secondary | ICD-10-CM | POA: Diagnosis not present

## 2022-01-28 DIAGNOSIS — C3412 Malignant neoplasm of upper lobe, left bronchus or lung: Secondary | ICD-10-CM | POA: Diagnosis not present

## 2022-01-28 DIAGNOSIS — J984 Other disorders of lung: Secondary | ICD-10-CM | POA: Diagnosis not present

## 2022-01-28 DIAGNOSIS — I680 Cerebral amyloid angiopathy: Secondary | ICD-10-CM | POA: Diagnosis present

## 2022-01-28 DIAGNOSIS — Z8249 Family history of ischemic heart disease and other diseases of the circulatory system: Secondary | ICD-10-CM | POA: Diagnosis not present

## 2022-01-28 DIAGNOSIS — R945 Abnormal results of liver function studies: Secondary | ICD-10-CM | POA: Diagnosis not present

## 2022-01-28 DIAGNOSIS — R0902 Hypoxemia: Secondary | ICD-10-CM | POA: Diagnosis not present

## 2022-01-28 DIAGNOSIS — I2510 Atherosclerotic heart disease of native coronary artery without angina pectoris: Secondary | ICD-10-CM | POA: Diagnosis present

## 2022-01-28 DIAGNOSIS — T451X5A Adverse effect of antineoplastic and immunosuppressive drugs, initial encounter: Secondary | ICD-10-CM | POA: Diagnosis present

## 2022-01-28 DIAGNOSIS — K8020 Calculus of gallbladder without cholecystitis without obstruction: Secondary | ICD-10-CM | POA: Diagnosis not present

## 2022-01-28 DIAGNOSIS — K219 Gastro-esophageal reflux disease without esophagitis: Secondary | ICD-10-CM | POA: Diagnosis present

## 2022-01-28 DIAGNOSIS — E854 Organ-limited amyloidosis: Secondary | ICD-10-CM | POA: Diagnosis not present

## 2022-01-28 DIAGNOSIS — Z7982 Long term (current) use of aspirin: Secondary | ICD-10-CM

## 2022-01-28 DIAGNOSIS — R918 Other nonspecific abnormal finding of lung field: Secondary | ICD-10-CM | POA: Diagnosis not present

## 2022-01-28 DIAGNOSIS — Z902 Acquired absence of lung [part of]: Secondary | ICD-10-CM

## 2022-01-28 DIAGNOSIS — R739 Hyperglycemia, unspecified: Secondary | ICD-10-CM | POA: Diagnosis not present

## 2022-01-28 DIAGNOSIS — Z7951 Long term (current) use of inhaled steroids: Secondary | ICD-10-CM

## 2022-01-28 DIAGNOSIS — T380X5A Adverse effect of glucocorticoids and synthetic analogues, initial encounter: Secondary | ICD-10-CM | POA: Diagnosis not present

## 2022-01-28 DIAGNOSIS — C3492 Malignant neoplasm of unspecified part of left bronchus or lung: Secondary | ICD-10-CM | POA: Diagnosis not present

## 2022-01-28 DIAGNOSIS — Z0000 Encounter for general adult medical examination without abnormal findings: Secondary | ICD-10-CM | POA: Diagnosis not present

## 2022-01-28 DIAGNOSIS — I10 Essential (primary) hypertension: Secondary | ICD-10-CM | POA: Diagnosis not present

## 2022-01-28 DIAGNOSIS — J9690 Respiratory failure, unspecified, unspecified whether with hypoxia or hypercapnia: Secondary | ICD-10-CM | POA: Diagnosis not present

## 2022-01-28 DIAGNOSIS — Z79890 Hormone replacement therapy (postmenopausal): Secondary | ICD-10-CM

## 2022-01-28 DIAGNOSIS — Z823 Family history of stroke: Secondary | ICD-10-CM

## 2022-01-28 DIAGNOSIS — K7689 Other specified diseases of liver: Secondary | ICD-10-CM | POA: Diagnosis not present

## 2022-01-28 LAB — CBC WITH DIFFERENTIAL/PLATELET
Abs Immature Granulocytes: 0.11 10*3/uL — ABNORMAL HIGH (ref 0.00–0.07)
Basophils Absolute: 0.1 10*3/uL (ref 0.0–0.1)
Basophils Relative: 1 %
Eosinophils Absolute: 0.1 10*3/uL (ref 0.0–0.5)
Eosinophils Relative: 1 %
HCT: 37.7 % (ref 36.0–46.0)
Hemoglobin: 11.3 g/dL — ABNORMAL LOW (ref 12.0–15.0)
Immature Granulocytes: 1 %
Lymphocytes Relative: 9 %
Lymphs Abs: 1 10*3/uL (ref 0.7–4.0)
MCH: 27.4 pg (ref 26.0–34.0)
MCHC: 30 g/dL (ref 30.0–36.0)
MCV: 91.3 fL (ref 80.0–100.0)
Monocytes Absolute: 1.5 10*3/uL — ABNORMAL HIGH (ref 0.1–1.0)
Monocytes Relative: 14 %
Neutro Abs: 8.1 10*3/uL — ABNORMAL HIGH (ref 1.7–7.7)
Neutrophils Relative %: 74 %
Platelets: 303 10*3/uL (ref 150–400)
RBC: 4.13 MIL/uL (ref 3.87–5.11)
RDW: 14.6 % (ref 11.5–15.5)
WBC: 10.9 10*3/uL — ABNORMAL HIGH (ref 4.0–10.5)
nRBC: 0 % (ref 0.0–0.2)

## 2022-01-28 LAB — BLOOD GAS, ARTERIAL
Acid-Base Excess: 14.2 mmol/L — ABNORMAL HIGH (ref 0.0–2.0)
Bicarbonate: 40.2 mmol/L — ABNORMAL HIGH (ref 20.0–28.0)
O2 Saturation: 100 %
Patient temperature: 37
pCO2 arterial: 54 mmHg — ABNORMAL HIGH (ref 32–48)
pH, Arterial: 7.48 — ABNORMAL HIGH (ref 7.35–7.45)
pO2, Arterial: 122 mmHg — ABNORMAL HIGH (ref 83–108)

## 2022-01-28 LAB — RESP PANEL BY RT-PCR (RSV, FLU A&B, COVID)  RVPGX2
Influenza A by PCR: NEGATIVE
Influenza B by PCR: NEGATIVE
Resp Syncytial Virus by PCR: NEGATIVE
SARS Coronavirus 2 by RT PCR: NEGATIVE

## 2022-01-28 LAB — I-STAT CHEM 8, ED
BUN: 16 mg/dL (ref 8–23)
Calcium, Ion: 1.15 mmol/L (ref 1.15–1.40)
Chloride: 95 mmol/L — ABNORMAL LOW (ref 98–111)
Creatinine, Ser: 1.1 mg/dL — ABNORMAL HIGH (ref 0.44–1.00)
Glucose, Bld: 126 mg/dL — ABNORMAL HIGH (ref 70–99)
HCT: 37 % (ref 36.0–46.0)
Hemoglobin: 12.6 g/dL (ref 12.0–15.0)
Potassium: 3.4 mmol/L — ABNORMAL LOW (ref 3.5–5.1)
Sodium: 140 mmol/L (ref 135–145)
TCO2: 33 mmol/L — ABNORMAL HIGH (ref 22–32)

## 2022-01-28 LAB — COMPREHENSIVE METABOLIC PANEL
ALT: 95 U/L — ABNORMAL HIGH (ref 0–44)
AST: 76 U/L — ABNORMAL HIGH (ref 15–41)
Albumin: 3.1 g/dL — ABNORMAL LOW (ref 3.5–5.0)
Alkaline Phosphatase: 80 U/L (ref 38–126)
Anion gap: 11 (ref 5–15)
BUN: 18 mg/dL (ref 8–23)
CO2: 32 mmol/L (ref 22–32)
Calcium: 9.1 mg/dL (ref 8.9–10.3)
Chloride: 97 mmol/L — ABNORMAL LOW (ref 98–111)
Creatinine, Ser: 1.07 mg/dL — ABNORMAL HIGH (ref 0.44–1.00)
GFR, Estimated: 54 mL/min — ABNORMAL LOW (ref >60)
Glucose, Bld: 128 mg/dL — ABNORMAL HIGH (ref 70–99)
Potassium: 3.3 mmol/L — ABNORMAL LOW (ref 3.5–5.1)
Sodium: 140 mmol/L (ref 135–145)
Total Bilirubin: 0.4 mg/dL (ref 0.3–1.2)
Total Protein: 7.5 g/dL (ref 6.5–8.1)

## 2022-01-28 LAB — BRAIN NATRIURETIC PEPTIDE: B Natriuretic Peptide: 32.2 pg/mL (ref 0.0–100.0)

## 2022-01-28 LAB — TROPONIN I (HIGH SENSITIVITY)
Troponin I (High Sensitivity): 4 ng/L (ref <18)
Troponin I (High Sensitivity): 6 ng/L (ref <18)

## 2022-01-28 MED ORDER — ASPIRIN 81 MG PO CHEW
81.0000 mg | CHEWABLE_TABLET | Freq: Every day | ORAL | Status: DC
  Administered 2022-01-28 – 2022-02-07 (×11): 81 mg via ORAL
  Filled 2022-01-28 (×12): qty 1

## 2022-01-28 MED ORDER — LEVOTHYROXINE SODIUM 50 MCG PO TABS
50.0000 ug | ORAL_TABLET | Freq: Every day | ORAL | Status: DC
  Administered 2022-01-29 – 2022-02-08 (×11): 50 ug via ORAL
  Filled 2022-01-28 (×11): qty 1

## 2022-01-28 MED ORDER — CHLORHEXIDINE GLUCONATE CLOTH 2 % EX PADS
6.0000 | MEDICATED_PAD | Freq: Every day | CUTANEOUS | Status: DC
  Administered 2022-01-28 – 2022-02-08 (×12): 6 via TOPICAL

## 2022-01-28 MED ORDER — ORAL CARE MOUTH RINSE
15.0000 mL | OROMUCOSAL | Status: DC
  Administered 2022-01-29 – 2022-02-08 (×28): 15 mL via OROMUCOSAL

## 2022-01-28 MED ORDER — METOPROLOL SUCCINATE ER 100 MG PO TB24
100.0000 mg | ORAL_TABLET | Freq: Every day | ORAL | Status: DC
  Administered 2022-01-29 – 2022-02-08 (×11): 100 mg via ORAL
  Filled 2022-01-28: qty 1
  Filled 2022-01-28 (×2): qty 4
  Filled 2022-01-28 (×5): qty 1
  Filled 2022-01-28 (×2): qty 4
  Filled 2022-01-28: qty 1

## 2022-01-28 MED ORDER — IOHEXOL 350 MG/ML SOLN
75.0000 mL | Freq: Once | INTRAVENOUS | Status: AC | PRN
  Administered 2022-01-28: 75 mL via INTRAVENOUS

## 2022-01-28 MED ORDER — ENOXAPARIN SODIUM 40 MG/0.4ML IJ SOSY
40.0000 mg | PREFILLED_SYRINGE | INTRAMUSCULAR | Status: DC
  Administered 2022-01-28 – 2022-02-07 (×11): 40 mg via SUBCUTANEOUS
  Filled 2022-01-28 (×11): qty 0.4

## 2022-01-28 MED ORDER — ACETAMINOPHEN 325 MG PO TABS: 650.0000 mg | ORAL_TABLET | Freq: Four times a day (QID) | ORAL | Status: DC | PRN

## 2022-01-28 MED ORDER — METHYLPREDNISOLONE SODIUM SUCC 40 MG IJ SOLR
40.0000 mg | Freq: Two times a day (BID) | INTRAMUSCULAR | Status: DC
  Administered 2022-01-28 – 2022-01-29 (×3): 40 mg via INTRAVENOUS
  Filled 2022-01-28 (×3): qty 1

## 2022-01-28 MED ORDER — SENNOSIDES-DOCUSATE SODIUM 8.6-50 MG PO TABS: 1.0000 | ORAL_TABLET | Freq: Every evening | ORAL | Status: DC | PRN

## 2022-01-28 MED ORDER — ARFORMOTEROL TARTRATE 15 MCG/2ML IN NEBU
15.0000 ug | INHALATION_SOLUTION | Freq: Two times a day (BID) | RESPIRATORY_TRACT | Status: DC
  Administered 2022-01-28 – 2022-02-01 (×8): 15 ug via RESPIRATORY_TRACT
  Filled 2022-01-28 (×8): qty 2

## 2022-01-28 MED ORDER — SODIUM CHLORIDE 0.9% FLUSH
3.0000 mL | Freq: Two times a day (BID) | INTRAVENOUS | Status: DC
  Administered 2022-01-28 – 2022-02-08 (×20): 3 mL via INTRAVENOUS

## 2022-01-28 MED ORDER — SODIUM CHLORIDE 0.9 % IV SOLN
1.0000 g | Freq: Once | INTRAVENOUS | Status: AC
  Administered 2022-01-28: 1 g via INTRAVENOUS
  Filled 2022-01-28: qty 10

## 2022-01-28 MED ORDER — POTASSIUM CHLORIDE 20 MEQ PO PACK
40.0000 meq | PACK | Freq: Once | ORAL | Status: AC
  Administered 2022-01-28: 40 meq via ORAL
  Filled 2022-01-28: qty 2

## 2022-01-28 MED ORDER — ONDANSETRON HCL 4 MG PO TABS: 4.0000 mg | ORAL_TABLET | Freq: Four times a day (QID) | ORAL | Status: DC | PRN

## 2022-01-28 MED ORDER — AMIODARONE HCL 100 MG PO TABS
100.0000 mg | ORAL_TABLET | Freq: Every day | ORAL | Status: DC
  Administered 2022-01-29 – 2022-02-01 (×4): 100 mg via ORAL
  Filled 2022-01-28 (×4): qty 1

## 2022-01-28 MED ORDER — ROSUVASTATIN CALCIUM 20 MG PO TABS
40.0000 mg | ORAL_TABLET | Freq: Every day | ORAL | Status: DC
  Administered 2022-01-28 – 2022-02-03 (×7): 40 mg via ORAL
  Filled 2022-01-28 (×7): qty 2

## 2022-01-28 MED ORDER — ROSUVASTATIN CALCIUM 20 MG PO TABS: 40.0000 mg | ORAL_TABLET | Freq: Every day | ORAL | Status: DC

## 2022-01-28 MED ORDER — ALBUTEROL SULFATE (2.5 MG/3ML) 0.083% IN NEBU
2.5000 mg | INHALATION_SOLUTION | Freq: Three times a day (TID) | RESPIRATORY_TRACT | Status: DC | PRN
  Administered 2022-01-30: 2.5 mg via RESPIRATORY_TRACT
  Filled 2022-01-28: qty 3

## 2022-01-28 MED ORDER — ORAL CARE MOUTH RINSE: 15.0000 mL | OROMUCOSAL | Status: DC | PRN

## 2022-01-28 MED ORDER — LEVOTHYROXINE SODIUM 25 MCG PO TABS: 12.5000 ug | ORAL_TABLET | Freq: Every day | ORAL | Status: DC

## 2022-01-28 MED ORDER — FUROSEMIDE 20 MG PO TABS
20.0000 mg | ORAL_TABLET | Freq: Every day | ORAL | Status: DC
  Administered 2022-01-29 – 2022-02-01 (×4): 20 mg via ORAL
  Filled 2022-01-28 (×4): qty 1

## 2022-01-28 MED ORDER — UMECLIDINIUM BROMIDE 62.5 MCG/ACT IN AEPB
1.0000 | INHALATION_SPRAY | Freq: Every day | RESPIRATORY_TRACT | Status: DC
  Administered 2022-01-29 – 2022-02-01 (×4): 1 via RESPIRATORY_TRACT
  Filled 2022-01-28: qty 7

## 2022-01-28 MED ORDER — SODIUM CHLORIDE 0.9 % IV SOLN
500.0000 mg | INTRAVENOUS | Status: DC
  Administered 2022-01-29 – 2022-02-02 (×5): 500 mg via INTRAVENOUS
  Filled 2022-01-28 (×7): qty 5

## 2022-01-28 MED ORDER — SODIUM CHLORIDE 0.9 % IV SOLN
500.0000 mg | Freq: Once | INTRAVENOUS | Status: AC
  Administered 2022-01-28: 500 mg via INTRAVENOUS
  Filled 2022-01-28: qty 5

## 2022-01-28 MED ORDER — ASPIRIN 81 MG PO CHEW
81.0000 mg | CHEWABLE_TABLET | Freq: Every day | ORAL | Status: DC
  Filled 2022-01-28: qty 1

## 2022-01-28 MED ORDER — POTASSIUM CHLORIDE CRYS ER 10 MEQ PO TBCR
10.0000 meq | EXTENDED_RELEASE_TABLET | ORAL | Status: DC
  Administered 2022-01-29 – 2022-02-08 (×6): 10 meq via ORAL
  Filled 2022-01-28 (×7): qty 1

## 2022-01-28 MED ORDER — ONDANSETRON HCL 4 MG/2ML IJ SOLN: 4.0000 mg | Freq: Four times a day (QID) | INTRAMUSCULAR | Status: DC | PRN

## 2022-01-28 MED ORDER — SODIUM CHLORIDE 0.9 % IV SOLN
1.0000 g | INTRAVENOUS | Status: DC
  Administered 2022-01-29: 1 g via INTRAVENOUS
  Filled 2022-01-28: qty 10

## 2022-01-28 MED ORDER — ACETAMINOPHEN 650 MG RE SUPP: 650.0000 mg | Freq: Four times a day (QID) | RECTAL | Status: DC | PRN

## 2022-01-28 MED ORDER — LEVOTHYROXINE SODIUM 25 MCG PO TABS
12.5000 ug | ORAL_TABLET | ORAL | Status: DC
  Administered 2022-01-29 – 2022-02-08 (×10): 12.5 ug via ORAL
  Filled 2022-01-28 (×10): qty 1

## 2022-01-28 NOTE — Assessment & Plan Note (Signed)
Continue rosuvastatin.  

## 2022-01-28 NOTE — H&P (Signed)
History and Physical    [name] VZD:[id number] DOB: 1945/09/11 DOA: 01/28/2022  PCP: Megan Freshwater, NP  Patient coming from: Home  I have personally briefly reviewed patient's old medical records in [place]  Chief Complaint: hypoxia  HPI: [name] is a 76 y.o. female with medical history significant for COPD, chronic respiratory failure with hypoxia on 2-3 L O2 via [place], NSCLC of the left lung s/p chemoradiation currently on immunotherapy, PAF (not on AC due to concern for amyloid angiopathy on MRI brain), hypothyroidism, HLD who presented to the ED for evaluation of hypoxia.  Patient states that since last Wednesday she has been experiencing lightheadedness and wooziness with exertion.  Normally she is requiring 2-3 L supplemental O2 via [name] however her O2 saturations have been dropping into the low 80s during this time.  Her husband had been titrating her O2 up to 5 L in order to keep SpO2 in the 90s.  She was seen by her PCP today and was found to have O2 saturation ranging between 70-78% on 5 L via [place].  She was therefore sent to the ED for further evaluation.  She is not really having much shortness of breath.  She reports chronic cough which is unchanged from baseline.  She had 1 episode of night sweats but otherwise has not had any fevers or chills.  Denies any chest pain, nausea, vomiting, abdominal pain, or lower extremity swelling.  ED Course  Labs/Imaging on admission: I have personally reviewed following labs and imaging studies.  Initial vitals showed BP 135/63, pulse 86, RR 23, temp 9 9.9 F, SpO2 72% on 5 L [place] in triage, placed on NRB 15 L with subsequent SpO2 100%.  Titrated down to 10 L HFNC.  Labs show WBC 10.9, hemoglobin 11.3, platelets 303,000, sodium 140, potassium 3.3, bicarb 23, BUN 18, creatinine 1.07, serum glucose 128, AST 76, ALT 95, alk phos 80, total bilirubin 0.4, troponin negative x 2, BNP 32.2.  ABG shows pH 7.48, pCO2 54, pO2  122.  SARS-CoV-2, influenza, RSV PCR negative.  CTA chest negative for PE.  New groundglass opacities with interlobular septal thickening throughout the right lung noted.  Posttreatment changes from prior left upper lobectomy noted.  Persistent irregular-appearing small left pleural effusion and progressive interstitial alveolar opacities throughout the left lower lobe noted.  Multiple mildly prominent mediastinal and right hilar lymph nodes have slightly increased compared to prior study, may be reactive or metastatic.  Attention on follow-up recommended.  Patient was given IV ceftriaxone and azithromycin.  The hospitalist service was consulted to admit for further evaluation and management.  Review of Systems: All systems reviewed and are negative except as documented in history of present illness above.   Past Medical History:  Diagnosis Date   CAD (coronary artery disease)    Cancer ([place])    COPD (chronic obstructive pulmonary disease) ([place])    per DM note, pt denies   Dyslipidemia    GERD (gastroesophageal reflux disease)    History of radiation therapy    Left lung- 01/16/21-01/30/21- Dr. Gery Pray   History of radiation therapy    Left lung- 08/02/21-09/18/21- Dr. Gery Pray   HTN (hypertension)    Hypothyroidism    Myocardial infarct [facility])    Non-small cell lung cancer ([place]) 08/19/2010   Stage IA, status post left upper lobectomy July 2012   Tobacco abuse     Past Surgical History:  Procedure Laterality Date   CARDIAC CATHETERIZATION  07/06/2010   see CABG report - pt sent to OR   CARDIOVASCULAR STRESS TEST  09/11/2010   R/S MV - normal perfusion in all regions, EF 46%, no scintigraphic evidence of inducible myocardial ischemia; global LV systolic function mildly reduced; no significant wall motion abnormalities noted; Exercise capacity 7 METS; EKG negative for ischemia; low risk study, no signifcant change from previous study 08/2003   CORONARY ARTERY BYPASS GRAFT   07/11/2010   LIMA to LAD; SVG to 2nd branch OM; SVG to posterior descending artery   IR IMAGING GUIDED PORT INSERTION  08/16/2021   LEFT VATS  09/17/2010   TEE WITHOUT CARDIOVERSION  07/06/2010   during emergent CABG surgery; 2-3+ mitral regurgitation   VIDEO BRONCHOSCOPY N/A 07/27/2021   Procedure: VIDEO BRONCHOSCOPY;  Surgeon: Melrose Nakayama, MD;  Location: [facility];  Service: Thoracic;  Laterality: N/A;   VIDEO BRONCHOSCOPY WITH ENDOBRONCHIAL ULTRASOUND N/A 07/27/2021   Procedure: VIDEO BRONCHOSCOPY WITH ENDOBRONCHIAL ULTRASOUND;  Surgeon: Melrose Nakayama, MD;  Location: [facility];  Service: Thoracic;  Laterality: N/A;    Social History:  reports that she quit smoking about 6 months ago. Her smoking use included cigarettes. She has a 58.00 pack-year smoking history. She has quit using smokeless tobacco. She reports that she does not drink alcohol and does not use drugs.  Allergies  Allergen Reactions   Codeine Other (See Comments)    Unknown reaction Patient avoids this medication    Family History  Problem Relation Age of Onset   Hypothyroidism Father    Heart attack Father    Aplastic anemia Maternal Grandfather    Stroke Paternal Grandfather      Prior to Admission medications   Medication Sig Start Date End Date Taking? Authorizing Provider  acetaminophen (TYLENOL) 500 MG tablet Take 500 mg by mouth every 6 (six) hours as needed for moderate pain or fever.    [provider]  albuterol (PROAIR HFA) 108 (90 Base) MCG/ACT inhaler 2 puffs up to every 4 hours as needed only  if your can't catch your breath 03/27/21   Tanda Rockers, MD  albuterol (PROVENTIL) (2.5 MG/3ML) 0.083% nebulizer solution Take 2.5 mg by nebulization 3 (three) times daily as needed for wheezing or shortness of breath.    [provider]  amiodarone (PACERONE) 100 MG tablet TAKE 1 TABLET BY MOUTH EVERY DAY 01/07/22   Troy Sine, MD  aspirin 81 MG tablet Take 81 mg by mouth at  bedtime.    [provider]  Coenzyme Q10 (CO Q 10 PO) Take 1 capsule by mouth daily.    [provider]  furosemide (LASIX) 20 MG tablet Take 1 tablet po BID for 5 days then go back to taking 1 tablet po QD 10/24/21   Megan Freshwater, NP  Glycopyrrolate-Formoterol (BEVESPI AEROSPHERE) 9-4.8 MCG/ACT AERO Inhale 2 puffs into the lungs 2 (two) times daily. Patient taking differently: Inhale 2 puffs into the lungs daily. 10/18/21   Cobb, Karie Schwalbe, NP  KLOR-CON M10 10 MEQ tablet TAKE 1 TABLET BY MOUTH EVERY DAY 11/12/21   Megan Freshwater, NP  levothyroxine (SYNTHROID) 25 MCG tablet Take 0.5 tablets (12.5 mcg total) by mouth daily. In addition to the 50 mcg. 01/09/22   Megan Freshwater, NP  levothyroxine (SYNTHROID) 50 MCG tablet Take 1 tablet (50 mcg total) by mouth See admin instructions. Along with 12.5 mcg qd except on Sunday (pt just takes 50 mcg) 01/09/22   Megan Freshwater,  NP  lidocaine-prilocaine (EMLA) cream Apply 1 Application topically as needed. 08/06/21   Heilingoetter, Cassandra L, PA-C  metoprolol succinate (TOPROL-XL) 100 MG 24 hr tablet Take 1 tablet (100 mg total) by mouth daily. Take with or immediately following a meal. 10/10/21   Boscia, Greer Ee, NP  naproxen sodium (ALEVE) 220 MG tablet Take 220 mg by mouth as needed.    [provider]  Omega-3 Fatty Acids (OMEGA-3 PO) Take 1,000 mg by mouth in the morning and at bedtime.    [provider]  OXYGEN Inhale 3.5 L into the lungs continuous.    [provider]  Phenylephrine-Acetaminophen (TYLENOL SINUS CONGESTION/PAIN PO) Take by mouth daily as needed.    [provider]  prochlorperazine (COMPAZINE) 10 MG tablet Take 1 tablet (10 mg total) by mouth every 6 (six) hours as needed. 08/06/21   Heilingoetter, Cassandra L, PA-C  rosuvastatin (CRESTOR) 40 MG tablet Take 1 tablet (40 mg total) by mouth daily. 09/27/21   Megan Freshwater, NP    Physical Exam: Vitals:   01/28/22  2021 01/28/22 2115 01/28/22 2130 01/28/22 2200  BP: (!) 114/58 (!) 112/58 (!) 111/49 (!) 104/51  Pulse: 78 80 79 78  Resp: 16 14 (!) 24 (!) 22  Temp: 99.1 F (37.3 C)     TempSrc:      SpO2: 94% 95% 90% 95%   Constitutional: Sitting up in bed, NAD, calm, comfortable Eyes: EOMI, lids and conjunctivae normal ENMT: Mucous membranes are moist. Posterior pharynx clear of any exudate or lesions.Normal dentition.  Neck: normal, supple, no masses. Respiratory: Bibasilar inspiratory crackles, right greater than left.  Normal respiratory effort while on 10 L O2 via HFNC. No accessory muscle use.  Cardiovascular: Regular rate and rhythm, no murmurs / rubs / gallops. No extremity edema. 2+ pedal pulses.  Port-A-Cath in place right chest wall. Abdomen: no tenderness, no masses palpated.  Musculoskeletal: no clubbing / cyanosis. No joint deformity upper and lower extremities. Good ROM, no contractures. Normal muscle tone.  Skin: no rashes, lesions, ulcers. No induration Neurologic: Sensation intact. Strength 5/5 in all 4.  Psychiatric: Alert and oriented x 3. Normal mood.   EKG: Personally reviewed. Sinus rhythm, rate 85, low voltage, QTc 531.  QT more prolonged when compared to prior.  Assessment/Plan Principal Problem:   Acute on chronic respiratory failure with hypoxia and hypercapnia (HCC) Active Problems:   COPD GOLD III   Non-small cell lung cancer (HCC)   Paroxysmal atrial fibrillation (HCC)   Dyslipidemia   Acquired hypothyroidism   Hypokalemia   JISELLE SHEU is a 76 y.o. female with medical history significant for COPD, chronic respiratory failure with hypoxia on 2-3 L O2 via [place], NSCLC of the left lung s/p chemoradiation currently on immunotherapy, PAF (not on AC due to concern for amyloid angiopathy on MRI brain), hypothyroidism, HLD who is admitted with acute on chronic hypoxic respiratory failure.  Assessment and Plan: * Acute on chronic respiratory failure with hypoxia and  hypercapnia (HCC) Usually wears 2-3 L O2 via [place] at baseline, requiring 10 L via HFNC on admission to maintain SpO2 >88%.  CTA chest negative for PE but does show new groundglass opacities throughout the right lung.  Could be atypical infection, pulmonary edema, or potentially pneumonitis related to immunotherapy.  Appears euvolemic. -Continue empiric IV ceftriaxone and azithromycin -Start IV Solu-Medrol 40 mg BID -Continue supplemental O2 and wean as able -Continue home Lasix 20 mg daily  COPD GOLD III Not much  wheezing on exam.  Increased O2 requirement as above due to new right-sided pulmonary opacities. -Continue Bevespi and albuterol as needed -On IV steroids as above -Wean supplemental O2 as able  Non-small cell lung cancer ([place]) Follows with oncology, Dr. Curt Bears.  History of remote LUL lobectomy.  S/p recent chemoradiation now on active immunotherapy with Durvalumab.  Paroxysmal atrial fibrillation (HCC) In sinus rhythm on admission with controlled rate.  Not on anticoagulation due to concern for high bleeding risk in setting of findings of amyloid angiopathy on prior MRI brain. -Continue Toprol-XL, amiodarone, and aspirin  Hypokalemia Oral supplement ordered.  Acquired hypothyroidism Continue Synthroid.  Dyslipidemia Continue rosuvastatin.  DVT prophylaxis: enoxaparin (LOVENOX) injection 40 mg Start: 01/28/22 2200 Code Status: Full code, confirmed with patient on admission Family Communication: Husband at bedside Disposition Plan: From home, dispo pending clinical progress Consults called: None Severity of Illness: The appropriate patient status for this patient is INPATIENT. Inpatient status is judged to be reasonable and necessary in order to provide the required intensity of service to ensure the patient's safety. The patient's presenting symptoms, physical exam findings, and initial radiographic and laboratory data in the context of their chronic comorbidities  is felt to place them at high risk for further clinical deterioration. Furthermore, it is not anticipated that the patient will be medically stable for discharge from the hospital within 2 midnights of admission.   * I certify that at the point of admission it is my clinical judgment that the patient will require inpatient hospital care spanning beyond 2 midnights from the point of admission due to high intensity of service, high risk for further deterioration and high frequency of surveillance required.Zada Finders MD [facility]  If 7PM-7AM, please contact night-coverage [email or web address]  01/28/2022, 10:04 PM

## 2022-01-28 NOTE — ED Provider Notes (Signed)
[facility] DEPT Provider Note   CSN: [id number] Arrival date & time: 01/28/22  1528     History  No chief complaint on file.   [name] is a 76 y.o. female.  Patient is a 76 year old female with a history of prior lung cancer.  She received chemo and radiation treatment.  She is currently on immunotherapy.  She is on baseline oxygen normally at 2 L/min.  He is here with her husband who helps provide history.  He states that she had a syncopal episode last week about 5 days ago.  She said that she has had these before and it did not seem to be unusual.  However she has progressively had some increased shortness of breath over the last several days and has had to increase her oxygen due to hypoxia that was noted at home.  She is currently on 5 L/min.  They noted today that her oxygen saturation was in the low 70s on her baseline 5 L and presented here for evaluation.  Patient does say that her shortness of breath is worse than baseline.  She denies any associated chest pain.  She has a cough which is mostly nonproductive.  No increased leg swelling.  No known fevers.       Home Medications Prior to Admission medications   Medication Sig Start Date End Date Taking? Authorizing Provider  acetaminophen (TYLENOL) 500 MG tablet Take 500 mg by mouth every 6 (six) hours as needed for moderate pain or fever.    [provider]  albuterol (PROAIR HFA) 108 (90 Base) MCG/ACT inhaler 2 puffs up to every 4 hours as needed only  if your can't catch your breath 03/27/21   Tanda Rockers, MD  albuterol (PROVENTIL) (2.5 MG/3ML) 0.083% nebulizer solution Take 2.5 mg by nebulization 3 (three) times daily as needed for wheezing or shortness of breath.    [provider]  amiodarone (PACERONE) 100 MG tablet TAKE 1 TABLET BY MOUTH EVERY DAY 01/07/22   Troy Sine, MD  aspirin 81 MG tablet Take 81 mg by mouth at bedtime.    [provider]   Coenzyme Q10 (CO Q 10 PO) Take 1 capsule by mouth daily.    [provider]  furosemide (LASIX) 20 MG tablet Take 1 tablet po BID for 5 days then go back to taking 1 tablet po QD 10/24/21   [name], NP  Glycopyrrolate-Formoterol (BEVESPI AEROSPHERE) 9-4.8 MCG/ACT AERO Inhale 2 puffs into the lungs 2 (two) times daily. Patient taking differently: Inhale 2 puffs into the lungs daily. 10/18/21   Cobb, Karie Schwalbe, NP  KLOR-CON M10 10 MEQ tablet TAKE 1 TABLET BY MOUTH EVERY DAY 11/12/21   [name], NP  levothyroxine (SYNTHROID) 25 MCG tablet Take 0.5 tablets (12.5 mcg total) by mouth daily. In addition to the 50 mcg. 01/09/22   [name], NP  levothyroxine (SYNTHROID) 50 MCG tablet Take 1 tablet (50 mcg total) by mouth See admin instructions. Along with 12.5 mcg qd except on Sunday (pt just takes 50 mcg) 01/09/22   [name], NP  lidocaine-prilocaine (EMLA) cream Apply 1 Application topically as needed. 08/06/21   Heilingoetter, Cassandra L, PA-C  metoprolol succinate (TOPROL-XL) 100 MG 24 hr tablet Take 1 tablet (100 mg total) by mouth daily. Take with or immediately following a meal. 10/10/21   [name], NP  naproxen sodium (ALEVE) 220 MG tablet Take 220 mg by mouth  as needed.    [provider]  Omega-3 Fatty Acids (OMEGA-3 PO) Take 1,000 mg by mouth in the morning and at bedtime.    [provider]  OXYGEN Inhale 3.5 L into the lungs continuous.    [provider]  Phenylephrine-Acetaminophen (TYLENOL SINUS CONGESTION/PAIN PO) Take by mouth daily as needed.    [provider]  prochlorperazine (COMPAZINE) 10 MG tablet Take 1 tablet (10 mg total) by mouth every 6 (six) hours as needed. 08/06/21   Heilingoetter, Cassandra L, PA-C  rosuvastatin (CRESTOR) 40 MG tablet Take 1 tablet (40 mg total) by mouth daily. 09/27/21   [name], NP      Allergies    Codeine    Review of Systems   Review of Systems   Constitutional:  Positive for fatigue. Negative for chills, diaphoresis and fever.  HENT:  Negative for congestion, rhinorrhea and sneezing.   Eyes: Negative.   Respiratory:  Positive for cough and shortness of breath. Negative for chest tightness.   Cardiovascular:  Negative for chest pain and leg swelling.  Gastrointestinal:  Negative for abdominal pain, blood in stool, diarrhea, nausea and vomiting.  Genitourinary:  Negative for difficulty urinating, flank pain, frequency and hematuria.  Musculoskeletal:  Negative for arthralgias and back pain.  Skin:  Negative for rash.  Neurological:  Positive for syncope. Negative for dizziness, speech difficulty, weakness, numbness and headaches.    Physical Exam Updated Vital Signs BP (!) 114/58   Pulse 78   Temp 99.1 F (37.3 C)   Resp 16   SpO2 94%  Physical Exam Constitutional:      Appearance: She is well-developed.  HENT:     Head: Normocephalic and atraumatic.  Eyes:     Pupils: Pupils are equal, round, and reactive to light.  Cardiovascular:     Rate and Rhythm: Normal rate and regular rhythm.     Heart sounds: Normal heart sounds.  Pulmonary:     Effort: Pulmonary effort is normal. No respiratory distress.     Breath sounds: Normal breath sounds. No wheezing or rales.     Comments: Decreased breath sounds on the left side with few rales Chest:     Chest wall: No tenderness.  Abdominal:     General: Bowel sounds are normal.     Palpations: Abdomen is soft.     Tenderness: There is no abdominal tenderness. There is no guarding or rebound.  Musculoskeletal:        General: Normal range of motion.     Cervical back: Normal range of motion and neck supple.     Comments: 1+ edema to lower extremities bilaterally, no calf tenderness  Lymphadenopathy:     Cervical: No cervical adenopathy.  Skin:    General: Skin is warm and dry.     Findings: No rash.  Neurological:     Mental Status: She is alert and oriented to person,  place, and time.     ED Results / Procedures / Treatments   Labs (all labs ordered are listed, but only abnormal results are displayed) Labs Reviewed  COMPREHENSIVE METABOLIC PANEL - Abnormal; Notable for the following components:      Result Value   Potassium 3.3 (*)    Chloride 97 (*)    Glucose, Bld 128 (*)    Creatinine, Ser 1.07 (*)    Albumin 3.1 (*)    AST 76 (*)    ALT 95 (*)    GFR, Estimated 54 (*)  All other components within normal limits  CBC WITH DIFFERENTIAL/PLATELET - Abnormal; Notable for the following components:   WBC 10.9 (*)    Hemoglobin 11.3 (*)    Neutro Abs 8.1 (*)    Monocytes Absolute 1.5 (*)    Abs Immature Granulocytes 0.11 (*)    All other components within normal limits  BLOOD GAS, ARTERIAL - Abnormal; Notable for the following components:   pH, Arterial 7.48 (*)    pCO2 arterial 54 (*)    pO2, Arterial 122 (*)    Bicarbonate 40.2 (*)    Acid-Base Excess 14.2 (*)    All other components within normal limits  I-STAT CHEM 8, ED - Abnormal; Notable for the following components:   Potassium 3.4 (*)    Chloride 95 (*)    Creatinine, Ser 1.10 (*)    Glucose, Bld 126 (*)    TCO2 33 (*)    All other components within normal limits  RESP PANEL BY RT-PCR (RSV, FLU A&B, COVID)  RVPGX2  BRAIN NATRIURETIC PEPTIDE  TROPONIN I (HIGH SENSITIVITY)  TROPONIN I (HIGH SENSITIVITY)    EKG EKG Interpretation  Date/Time:  Monday January 28 2022 15:53:59 EST Ventricular Rate:  85 PR Interval:  159 QRS Duration: 104 QT Interval:  446 QTC Calculation: 531 R Axis:   21 Text Interpretation: Sinus rhythm Low voltage, precordial leads Borderline T abnormalities, anterior leads Prolonged QT interval Confirmed by Malvin Johns [phone]) on 01/28/2022 4:19:25 PM  Radiology CT Angio Chest PE W/Cm &/Or Wo Cm  Result Date: 01/28/2022 CLINICAL DATA:  Pulmonary embolism (PE) suspected, high prob. History of non-small cell lung cancer EXAM: CT ANGIOGRAPHY CHEST  WITH CONTRAST TECHNIQUE: Multidetector CT imaging of the chest was performed using the standard protocol during bolus administration of intravenous contrast. Multiplanar CT image reconstructions and MIPs were obtained to evaluate the vascular anatomy. RADIATION DOSE REDUCTION: This exam was performed according to the departmental dose-optimization program which includes automated exposure control, adjustment of the mA and/or kV according to patient size and/or use of iterative reconstruction technique. CONTRAST:  23mL OMNIPAQUE IOHEXOL 350 MG/ML SOLN COMPARISON:  01/07/2022 FINDINGS: Cardiovascular: Satisfactory opacification of the pulmonary arteries. No evidence of pulmonary embolism to the lobar branch level. Evaluation of the more distal pulmonary arterial branches is limited in the setting of respiratory motion artifact. Thoracic aorta is nonaneurysmal. Scattered atherosclerotic vascular calcifications of the aorta and coronary arteries. Prior sternotomy and CABG normal heart size. No pericardial effusion. Right chest port terminates at the level of the proximal right atrium. Mediastinum/Nodes: Prominent soft tissue in the left perihilar region is unchanged. Multiple mildly prominent mediastinal and right hilar lymph nodes have slightly increased the previous study, and may be reactive. No axillary lymphadenopathy. Thyroid, trachea, and esophagus demonstrate no significant findings. Lungs/Pleura: Prior left upper lobectomy with associated volume loss in the left hemithorax. Persistent irregular appearing small left pleural effusion. Progressive interstitial and alveolar opacities throughout the left lower lobe. There are new predominantly ground-glass opacities with interlobular septal thickening throughout the right lung. No right-sided pleural effusion. No pneumothorax. Upper Abdomen: No acute findings within the included upper abdomen. Musculoskeletal: No chest wall abnormality. No acute or significant  osseous findings. Review of the MIP images confirms the above findings. IMPRESSION: 1. No evidence of pulmonary embolism to the lobar branch level. 2. New predominantly ground-glass opacities with interlobular septal thickening throughout the right lung, which may represent pulmonary edema or atypical infection. 3. Post-treatment changes from prior left upper lobectomy. Persistent  irregular appearing small left pleural effusion. Progressive interstitial and alveolar opacities throughout the left lower lobe, which may represent edema, infection, versus lymphangitic spread of tumor. 4. Multiple mildly prominent mediastinal and right hilar lymph nodes have slightly increased the previous study, and may be reactive or metastatic. Attention on follow-up. 5. Aortic and coronary artery atherosclerosis (ICD10-I70.0). Electronically Signed   By: Davina Poke D.O.   On: 01/28/2022 19:43   DG Chest Port 1 View  Result Date: 01/28/2022 CLINICAL DATA:  Pt arrived via POV, c/o low oxygen. States had follow up in office, told oxygen was mid 70s. Pt states she had her oxygen increased to 5L [place] at home recently. EXAM: PORTABLE CHEST - 1 VIEW COMPARISON:  08/29/2021 FINDINGS: Interval decrease in left pleural effusion. Improved aeration of the left lung base although moderate scattered airspace opacities persist. There has been some increase in ill-defined alveolar opacities at the right lung base. Stable right IJ power port to the distal SVC. CABG markers. Heart size and mediastinal contours are within normal limits. Aortic Atherosclerosis (ICD10-170.0). No pneumothorax. Sternotomy wires. IMPRESSION: 1. Interval decrease in left pleural effusion. 2. Worsening right lower lung airspace disease. Electronically Signed   By: Lucrezia Europe M.D.   On: 01/28/2022 16:19    Procedures Procedures    Medications Ordered in ED Medications  azithromycin (ZITHROMAX) 500 mg in sodium chloride 0.9 % 250 mL IVPB (500 mg Intravenous New  Bag/Given 01/28/22 2022)  iohexol (OMNIPAQUE) 350 MG/ML injection 75 mL (75 mLs Intravenous Contrast Given 01/28/22 1915)  cefTRIAXone (ROCEPHIN) 1 g in sodium chloride 0.9 % 100 mL IVPB (1 g Intravenous New Bag/Given 01/28/22 2022)    ED Course/ Medical Decision Making/ A&P                           Medical Decision Making Amount and/or Complexity of Data Reviewed Labs: ordered. Radiology: ordered.  Risk Prescription drug management. Decision regarding hospitalization.   Patient presents with ornis of breath and new oxygen requirement.  She is baseline on 2 L/min but has increased it to 5 L/min.  She is hypoxic into the 70s on 5 L/min.  She was initially placed on a nonrebreather mask on arrival.  This brought her saturations up to the mid 90s.  She does not have any significant increased work of breathing and is talking in full sentences.  Chest x-ray was performed which is interpreted by me and confirmed by the radiologist to show evidence of small to moderate left pleural effusion and potentially some new patchy infiltrate in the right lung.  CTA of the chest was performed which shows no evidence of PE.  There is potential new areas of pneumonia versus neoplasm.  She does not have any suggestions of fluid overload.  Her BNP is normal.  Her troponins are normal.  No evidence of ACS.  She is currently on nasal cannula at 10 L/min which is maintaining her oxygen saturations in the mid to low 90s.  I spoke with Dr. Posey Pronto who will admit the patient for further treatment.  Her COVID/flu test was negative.  CRITICAL CARE Performed by: Malvin Johns Total critical care time: 70 minutes Critical care time was exclusive of separately billable procedures and treating other patients. Critical care was necessary to treat or prevent imminent or life-threatening deterioration. Critical care was time spent personally by me on the following activities: development of treatment plan with patient and/or  surrogate as  well as nursing, discussions with consultants, evaluation of patient's response to treatment, examination of patient, obtaining history from patient or surrogate, ordering and performing treatments and interventions, ordering and review of laboratory studies, ordering and review of radiographic studies, pulse oximetry and re-evaluation of patient's condition.   Final Clinical Impression(s) / ED Diagnoses Final diagnoses:  Acute on chronic respiratory failure with hypoxia [facility])    Rx / DC Orders ED Discharge Orders     None         Malvin Johns, MD 01/28/22 2054

## 2022-01-28 NOTE — Assessment & Plan Note (Signed)
Oral supplement ordered.

## 2022-01-28 NOTE — ED Triage Notes (Signed)
Pt arrived via POV, c/o low oxygen. States had follow up in office, told oxygen was mid 48s. Pt states she had her oxygen increased to 5L [place] at home recently.   Spo2 72% on 5L [place] in triage, placed on NRB 15L

## 2022-01-28 NOTE — Progress Notes (Signed)
RT NOTE:  ABG obtained and sent to lab. Lab notified. 

## 2022-01-28 NOTE — Assessment & Plan Note (Signed)
Follows with oncology, Dr. Curt Bears.  History of remote LUL lobectomy.  S/p recent chemoradiation now on active immunotherapy with Durvalumab.

## 2022-01-28 NOTE — Progress Notes (Signed)
Subjective:   [name] is a 76 y.o. female who presents for Medicare Annual (Subsequent) preventive examination. -some shortness of breath -oxygen saturation at 70 - 80% on 5 liters per nasal cannula oxygen  -last Wednesday, she had a "sinking" experience. Felt dizzy. Heart rate was elevated. States that she has had weakness and shortness of breath since then. Blood pressure at home has been low. On 5 liters of home oxygen per nasal cannula, hersaturation remains between 78 and 88%.  -CT chest done 01/07/2022 showed -  1. Post LEFT upper lobectomy. 2. LEFT-sided pleural effusion appears slightly more regular and now shows signs of irregular septal thickening. Some of this could be post treatment related but there is a suggestion of dependent nodule in the LEFT chest raising the question of residual/recurrent disease. PET scan may be helpful or short interval follow-up as appropriate. 3. Septal thickening outlined above does not allow for exclusion of lymphangitic carcinomatosis. With reported history of radiotherapy this could also be related to post treatment changes. Would also correlate with any respiratory symptoms. 4. Gallbladder wall irregularity seen on previous imaging. Gallbladder is incompletely imaged. Correlate with any symptoms that would suggest chronic cholecystitis. Dedicated abdominal imaging could be performed as warranted. 5. Aortic atherosclerosis. Patient states that oncology office offered to drain fluid from her lungs back in November after this scan, but patient states that she declined at the time.   Review of Systems    Refer to HPI        Objective:    Today's Vitals   01/28/22 1428  BP: (Abnormal) 94/52  Pulse: 90  Resp: 20  SpO2: (Abnormal) 78%  Weight: 208 lb (94.3 kg)  Height: 5\' 3"  (1.6 m)   Body mass index is 36.85 kg/m.    Row Labels 11/15/2021    9:04 AM 11/15/2021    8:41 AM 11/15/2021    8:31 AM 10/29/2021   11:22 AM  10/18/2021    1:28 PM 09/03/2021    9:53 AM 08/20/2021    8:33 AM  Advanced Directives   Section Header. No data exists in this row.         Does Patient Have a Medical Advance Directive?   Yes Yes Yes Yes No No No  Type of Theatre stage manager of [place];Living will [place];Living will [place];Living will     Does patient want to make changes to medical advance directive?   No - Patient declined        Would patient like information on creating a medical advance directive?       No - Patient declined No - Patient declined No - Patient declined    Current Medications (verified) Outpatient Encounter Medications as of 01/28/2022  Medication Sig   acetaminophen (TYLENOL) 500 MG tablet Take 500 mg by mouth every 6 (six) hours as needed for moderate pain or fever.   albuterol (PROAIR HFA) 108 (90 Base) MCG/ACT inhaler 2 puffs up to every 4 hours as needed only  if your can't catch your breath   albuterol (PROVENTIL) (2.5 MG/3ML) 0.083% nebulizer solution Take 2.5 mg by nebulization 3 (three) times daily as needed for wheezing or shortness of breath.   amiodarone (PACERONE) 100 MG tablet TAKE 1 TABLET BY MOUTH EVERY DAY   aspirin 81 MG tablet Take 81 mg by mouth at bedtime.   Coenzyme Q10 (CO Q 10 PO) Take 1 capsule by mouth daily.  furosemide (LASIX) 20 MG tablet Take 1 tablet po BID for 5 days then go back to taking 1 tablet po QD   Glycopyrrolate-Formoterol (BEVESPI AEROSPHERE) 9-4.8 MCG/ACT AERO Inhale 2 puffs into the lungs 2 (two) times daily. (Patient taking differently: Inhale 2 puffs into the lungs daily.)   KLOR-CON M10 10 MEQ tablet TAKE 1 TABLET BY MOUTH EVERY DAY   levothyroxine (SYNTHROID) 25 MCG tablet Take 0.5 tablets (12.5 mcg total) by mouth daily. In addition to the 50 mcg.   levothyroxine (SYNTHROID) 50 MCG tablet Take 1 tablet (50 mcg total) by mouth See admin instructions. Along with 12.5 mcg qd except on Sunday (pt just  takes 50 mcg)   lidocaine-prilocaine (EMLA) cream Apply 1 Application topically as needed.   metoprolol succinate (TOPROL-XL) 100 MG 24 hr tablet Take 1 tablet (100 mg total) by mouth daily. Take with or immediately following a meal.   naproxen sodium (ALEVE) 220 MG tablet Take 220 mg by mouth as needed.   Omega-3 Fatty Acids (OMEGA-3 PO) Take 1,000 mg by mouth in the morning and at bedtime.   OXYGEN Inhale 3.5 L into the lungs continuous.   Phenylephrine-Acetaminophen (TYLENOL SINUS CONGESTION/PAIN PO) Take by mouth daily as needed.   prochlorperazine (COMPAZINE) 10 MG tablet Take 1 tablet (10 mg total) by mouth every 6 (six) hours as needed.   rosuvastatin (CRESTOR) 40 MG tablet Take 1 tablet (40 mg total) by mouth daily.   No facility-administered encounter medications on file as of 01/28/2022.    Allergies (verified) Codeine   History: Past Medical History:  Diagnosis Date   CAD (coronary artery disease)    Cancer ([place])    COPD (chronic obstructive pulmonary disease) ([place])    per DM note, pt denies   Dyslipidemia    GERD (gastroesophageal reflux disease)    History of radiation therapy    Left lung- 01/16/21-01/30/21- Dr. Gery Pray   History of radiation therapy    Left lung- 08/02/21-09/18/21- Dr. Gery Pray   HTN (hypertension)    Hypothyroidism    Myocardial infarct [facility])    Non-small cell lung cancer ([place]) 08/19/2010   Stage IA, status post left upper lobectomy July 2012   Tobacco abuse    Past Surgical History:  Procedure Laterality Date   CARDIAC CATHETERIZATION  07/06/2010   see CABG report - pt sent to OR   CARDIOVASCULAR STRESS TEST  09/11/2010   R/S MV - normal perfusion in all regions, EF 46%, no scintigraphic evidence of inducible myocardial ischemia; global LV systolic function mildly reduced; no significant wall motion abnormalities noted; Exercise capacity 7 METS; EKG negative for ischemia; low risk study, no signifcant change from previous study 08/2003    CORONARY ARTERY BYPASS GRAFT  07/11/2010   LIMA to LAD; SVG to 2nd branch OM; SVG to posterior descending artery   IR IMAGING GUIDED PORT INSERTION  08/16/2021   LEFT VATS  09/17/2010   TEE WITHOUT CARDIOVERSION  07/06/2010   during emergent CABG surgery; 2-3+ mitral regurgitation   VIDEO BRONCHOSCOPY N/A 07/27/2021   Procedure: VIDEO BRONCHOSCOPY;  Surgeon: Melrose Nakayama, MD;  Location: [facility];  Service: Thoracic;  Laterality: N/A;   VIDEO BRONCHOSCOPY WITH ENDOBRONCHIAL ULTRASOUND N/A 07/27/2021   Procedure: VIDEO BRONCHOSCOPY WITH ENDOBRONCHIAL ULTRASOUND;  Surgeon: Melrose Nakayama, MD;  Location: [facility] OR;  Service: Thoracic;  Laterality: N/A;   Family History  Problem Relation Age of Onset   Hypothyroidism Father    Heart attack Father  Aplastic anemia Maternal Grandfather    Stroke Paternal Grandfather    Social History   Socioeconomic History   Marital status: Married    Spouse name: Not on file   Number of children: Not on file   Years of education: Not on file   Highest education level: Not on file  Occupational History   Not on file  Tobacco Use   Smoking status: Former    Packs/day: 1.00    Years: 58.00    Total pack years: 58.00    Types: Cigarettes    Quit date: 07/19/2021    Years since quitting: 0.5   Smokeless tobacco: Former   Tobacco comments:    Pt states she is currently smoking  Vaping Use   Vaping Use: Never used  Substance and Sexual Activity   Alcohol use: No    Alcohol/week: 0.0 standard drinks of alcohol   Drug use: No   Sexual activity: Never  Other Topics Concern   Not on file  Social History Narrative   Not on file   Social Determinants of Health   Financial Resource Strain: Low Risk  (01/28/2022)   Overall Financial Resource Strain (CARDIA)    Difficulty of Paying Living Expenses: Not hard at all  Food Insecurity: No Food Insecurity (01/28/2022)   Hunger Vital Sign    Worried About Running Out of Food in the Last Year:  Never true    [place] in the Last Year: Never true  Transportation Needs: No Transportation Needs (12/21/2021)   PRAPARE - Hydrologist (Medical): No    Lack of Transportation (Non-Medical): No  Physical Activity: Inactive (01/28/2022)   Exercise Vital Sign    Days of Exercise per Week: 0 days    Minutes of Exercise per Session: 0 min  Stress: No Stress Concern Present (01/28/2022)   [place]    Feeling of Stress : Not at all  Social Connections: Socially Isolated (01/28/2022)   Social Connection and Isolation Panel [NHANES]    Frequency of Communication with Friends and Family: Once a week    Frequency of Social Gatherings with Friends and Family: Once a week    Attends Religious Services: Never    Marine scientist or Organizations: No    Attends Archivist Meetings: Never    Marital Status: Married  Physical Exam Vitals and nursing note reviewed.  Constitutional:      Appearance: Normal appearance. She is well-developed. She is obese. She is ill-appearing.  HENT:     Head: Normocephalic and atraumatic.     Nose: Nose normal.     Mouth/Throat:     Mouth: Mucous membranes are moist.     Pharynx: Oropharynx is clear.  Eyes:     Extraocular Movements: Extraocular movements intact.     Conjunctiva/sclera: Conjunctivae normal.     Pupils: Pupils are equal, round, and reactive to light.  Neck:     Comments: JVD present on left side of neck  Cardiovascular:     Rate and Rhythm: Normal rate and regular rhythm.     Pulses: Normal pulses.     Heart sounds: Normal heart sounds.  Pulmonary:     Effort: Pulmonary effort is normal.     Breath sounds: Wheezing and rhonchi present.     Comments: Oxygen saturation at 70 % on 5 liters of nasal cannula oxygen  Abdominal:  Palpations: Abdomen is soft.  Musculoskeletal:        General: Normal range of motion.      Cervical back: Normal range of motion and neck supple.  Lymphadenopathy:     Cervical: No cervical adenopathy.  Skin:    General: Skin is warm and dry.     Capillary Refill: Capillary refill takes less than 2 seconds.  Neurological:     General: No focal deficit present.     Mental Status: She is alert and oriented to person, place, and time.  Psychiatric:        Mood and Affect: Mood normal.        Behavior: Behavior normal.        Thought Content: Thought content normal.        Judgment: Judgment normal.      Tobacco Counseling Counseling given: Not Answered Tobacco comments: Pt states she is currently smoking   Clinical Intake:                 Diabetic? No         Activities of Daily Living   Row Labels 01/28/2022    2:11 PM 09/27/2021    9:43 AM  In your present state of health, do you have any difficulty performing the following activities:   Section Header. No data exists in this row.    Hearing?   0 0  Vision?   0 0  Difficulty concentrating or making decisions?   0 0  Walking or climbing stairs?   1 1  Dressing or bathing?   0 0  Doing errands, shopping?   1 0    Patient Care Team: [name], NP as PCP - General (Family Medicine) Troy Sine, MD as PCP - Cardiology (Cardiology) Melrose Nakayama, MD (Cardiothoracic Surgery) Troy Sine, MD (Cardiology) Wonda Horner, MD as Consulting Physician (Gastroenterology)  Indicate any recent Medical Services you may have received from other than [facility] providers in the past year (date may be approximate).     Assessment:   1. Encounter for Medicare annual wellness exam Encounter for medicare wellness visit.   2. Hypoxia Patient's oxygen saturation between 70 and 78% on 5 liters per minute. Strongly advised patient to be seen at ED at [facility]. She and her husband both agree that they will go to ED at [facility] for further evaluation and treatment.   3.  Malignant neoplasm of upper lobe of left lung ([place]) Reviewed most recent CT chest with the patient, done 01/07/2022, which did show increased left pleural effusion. There is concern for recurring disease. Patient to go directly to ED at [facility] for further evaluation.   4. Supplemental oxygen dependent Currently on oxygen at 5 liters per minute. She does see pulmonology on routine basis. Is scheduled to see Dr. Melvyn Novas next month.   Hearing/Vision screen No results found.   Depression Screen   Row Labels 01/28/2022    2:08 PM 09/27/2021    9:42 AM 08/17/2021   10:20 AM 02/26/2021   11:50 AM 10/25/2020    9:00 AM 07/24/2020    3:13 PM 06/20/2020    3:06 PM  PHQ 2/9 Scores   Section Header. No data exists in this row.         PHQ - 2 Score   0 0 0 0 1 0 0  PHQ- 9 Score   3 0 0 0 2 1 0  Fall Risk   Row Labels 01/28/2022    2:12 PM 09/27/2021    9:42 AM 02/26/2021   11:50 AM 07/24/2020    3:13 PM 06/20/2020    3:05 PM  Fall Risk    Section Header. No data exists in this row.       Falls in the past year?   0 0 0 0 0  Number falls in past yr:    0 0 0 0  Injury with Fall?   0 0 0 0 0  Risk for fall due to :    Impaired balance/gait  No Fall Risks   Follow up    Falls evaluation completed Falls evaluation completed Falls evaluation completed Falls evaluation completed    [place]:  Any stairs in or around the home? Yes  If so, are there any without handrails? No  Home free of loose throw rugs in walkways, pet beds, electrical cords, etc? No  Adequate lighting in your home to reduce risk of falls? Yes   ASSISTIVE DEVICES UTILIZED TO PREVENT FALLS:  Life alert? No  Use of a cane, walker or w/c? Yes  Grab bars in the bathroom? No  Shower chair or bench in shower? Yes  Elevated toilet seat or a handicapped toilet? No   TIMED UP AND GO:  Was the test performed? Yes .  Length of time to ambulate 10 feet: 10-15 sec.   Gait steady and fast without  use of assistive device  Cognitive Function:       Row Labels 01/28/2022    2:07 PM 04/06/2020   11:46 AM 01/27/2019    2:38 PM  6CIT Screen   Section Header. No data exists in this row.     What Year?   0 points 0 points 0 points  What month?   0 points 0 points 0 points  What time?   0 points 0 points 0 points  Count back from 20   0 points 0 points 0 points  Months in reverse   0 points 0 points 0 points  Repeat phrase   0 points 0 points 0 points  Total Score   0 points 0 points 0 points    Immunizations Immunization History  Administered Date(s) Administered   Pneumococcal Polysaccharide-23 01/28/2019   Tdap 02/02/2019   Zoster Recombinat (Shingrix) 06/14/2019    TDAP status: Up to date  Flu Vaccine status: Declined, Education has been provided regarding the importance of this vaccine but patient still declined. Advised may receive this vaccine at local pharmacy or Health Dept. Aware to provide a copy of the vaccination record if obtained from local pharmacy or Health Dept. Verbalized acceptance and understanding.  Pneumococcal vaccine status: Up to date  Covid-19 vaccine status: Declined, Education has been provided regarding the importance of this vaccine but patient still declined. Advised may receive this vaccine at local pharmacy or Health Dept.or vaccine clinic. Aware to provide a copy of the vaccination record if obtained from local pharmacy or Health Dept. Verbalized acceptance and understanding.  Qualifies for Shingles Vaccine? Yes   Zostavax completed Yes   Shingrix Completed?: Yes  Screening Tests Health Maintenance  Topic Date Due   COVID-19 Vaccine (1) Never done   Hepatitis C Screening  Never done   Zoster Vaccines- Shingrix (2 of 2) 08/09/2019   Pneumonia Vaccine 93+ Years old (2 - PCV) 01/28/2020   Medicare Annual Wellness (AWV)  04/06/2021   INFLUENZA  VACCINE  Never done   COLONOSCOPY (Pts 45-17yrs Insurance coverage will need to be confirmed)   02/26/2022 (Originally 11/11/2020)   DTaP/Tdap/Td (2 - Td or Tdap) 02/01/2029   DEXA SCAN  Completed   HPV VACCINES  Aged Out    Health Maintenance  Health Maintenance Due  Topic Date Due   COVID-19 Vaccine (1) Never done   Hepatitis C Screening  Never done   Zoster Vaccines- Shingrix (2 of 2) 08/09/2019   Pneumonia Vaccine 39+ Years old (2 - PCV) 01/28/2020   Medicare Annual Wellness (AWV)  04/06/2021   INFLUENZA VACCINE  Never done    Colon cancer screening postponed at today's visit - 01/28/2022   Mammogram status: Ordered postponed 01/28/2022. Pt provided with contact info and advised to call to schedule appt.   Bone Density status: Completed 04/27/2019. Results reflect: Bone density results: OSTEOPENIA. Repeat every 2-3 years.  Lung Cancer Screening: (Low Dose CT Chest recommended if Age 4-80 years, 30 pack-year currently smoking OR have quit w/in 15years.) does not qualify.   Lung Cancer Screening Referral: n/a  Additional Screening:  Hepatitis C Screening: does not qualify; Completed n/a  Vision Screening: Recommended annual ophthalmology exams for early detection of glaucoma and other disorders of the eye. Is the patient up to date with their annual eye exam?  Yes  Who is the provider or what is the name of the office in which the patient attends annual eye exams? [facility]  If pt is not established with a provider, would they like to be referred to a provider to establish care? No .   Dental Screening: Recommended annual dental exams for proper oral hygiene  Community Resource Referral / Chronic Care Management: CRR required this visit?  No   CCM required this visit?  No      Plan:     I have personally reviewed and noted the following in the patient's chart:   Medical and social history Use of alcohol, tobacco or illicit drugs  Current medications and supplements including opioid prescriptions. Patient is not currently taking opioid  prescriptions. Functional ability and status Nutritional status Physical activity Advanced directives List of other physicians Hospitalizations, surgeries, and ER visits in previous 12 months Vitals Screenings to include cognitive, depression, and falls Referrals and appointments  In addition, I have reviewed and discussed with patient certain preventive protocols, quality metrics, and best practice recommendations. A written personalized care plan for preventive services as well as general preventive health recommendations were provided to patient.     [name], NP   01/28/2022   Nurse Notes: Face to face 20 min

## 2022-01-28 NOTE — Assessment & Plan Note (Addendum)
Usually wears 2-3 L O2 via [place] at baseline, requiring 10 L via HFNC on admission to maintain SpO2 >88%.  CTA chest negative for PE but does show new groundglass opacities throughout the right lung.  Could be atypical infection, pulmonary edema, or potentially pneumonitis related to immunotherapy.  Appears euvolemic. -Continue empiric IV ceftriaxone and azithromycin -Start IV Solu-Medrol 40 mg BID -Continue supplemental O2 and wean as able -Continue home Lasix 20 mg daily

## 2022-01-28 NOTE — ED Notes (Signed)
ED TO INPATIENT HANDOFF REPORT  Name/Age/Gender [name] 76 y.o. female  Code Status    Code Status Orders  (From admission, onward)           Start     Ordered   01/28/22 2142  Full code  Continuous        01/28/22 2143           Code Status History     Date Active Date Inactive Code Status Order ID Comments User Context   08/09/2021 0030 08/11/2021 1822 Full Code [id number]  Etta Quill, DO ED       Home/SNF/Other Home  Chief Complaint Acute on chronic respiratory failure with hypoxia and hypercapnia ([place]) [J96.21, J96.22]  Level of Care/Admitting Diagnosis ED Disposition     ED Disposition  Admit   Condition  --   [place]: [place] [[id number]]  Level of Care: Stepdown [14]  Admit to SDU based on following criteria: Respiratory Distress:  Frequent assessment and/or intervention to maintain adequate ventilation/respiration, pulmonary toilet, and respiratory treatment.  May admit patient to [facility] or [facility] if equivalent level of care is available:: No  Covid Evaluation: Confirmed COVID Negative  Diagnosis: Acute on chronic respiratory failure with hypoxia and hypercapnia [facility]) [[id number]]  Admitting Physician: Lenore Cordia [[id number]]  Attending Physician: Lenore Cordia [[id number]]  Certification:: I certify this patient will need inpatient services for at least 2 midnights  Estimated Length of Stay: 3          Medical History Past Medical History:  Diagnosis Date   CAD (coronary artery disease)    Cancer ([place])    COPD (chronic obstructive pulmonary disease) ([place])    per DM note, pt denies   Dyslipidemia    GERD (gastroesophageal reflux disease)    History of radiation therapy    Left lung- 01/16/21-01/30/21- Dr. Gery Pray   History of radiation therapy    Left lung- 08/02/21-09/18/21- Dr. Gery Pray   HTN (hypertension)    Hypothyroidism    Myocardial infarct [facility])    Non-small  cell lung cancer ([place]) 08/19/2010   Stage IA, status post left upper lobectomy July 2012   Tobacco abuse     Allergies Allergies  Allergen Reactions   Codeine Other (See Comments)    Unknown reaction Patient avoids this medication    IV Location/Drains/Wounds Patient Lines/Drains/Airways Status     Active Line/Drains/Airways     Name Placement date Placement time Site Days   Implanted Port 08/16/21 Right Chest 08/16/21  1304  Chest  165            Labs/Imaging Results for orders placed or performed during the hospital encounter of 01/28/22 (from the past 48 hour(s))  Blood gas, arterial     Status: Abnormal   Collection Time: 01/28/22  3:44 PM  Result Value Ref Range   pH, Arterial 7.48 (H) 7.35 - 7.45   pCO2 arterial 54 (H) 32 - 48 mmHg   pO2, Arterial 122 (H) 83 - 108 mmHg   Bicarbonate 40.2 (H) 20.0 - 28.0 mmol/L   Acid-Base Excess 14.2 (H) 0.0 - 2.0 mmol/L   O2 Saturation 100 %   Patient temperature 37.0    [place]ens test (pass/fail) PASS PASS    Comment: Performed at [facility], [place] [address] [place], [place] [address]  Resp panel by RT-PCR (RSV, Flu A&B, Covid) Anterior Nasal Swab     Status:  None   Collection Time: 01/28/22  3:44 PM   Specimen: Anterior Nasal Swab  Result Value Ref Range   SARS Coronavirus 2 by RT PCR NEGATIVE NEGATIVE    Comment: (NOTE) SARS-CoV-2 target nucleic acids are NOT DETECTED.  The SARS-CoV-2 RNA is generally detectable in upper respiratory specimens during the acute phase of infection. The lowest concentration of SARS-CoV-2 viral copies this assay can detect is 138 copies/mL. A negative result does not preclude SARS-Cov-2 infection and should not be used as the sole basis for treatment or other patient management decisions. A negative result may occur with  improper specimen collection/handling, submission of specimen other than nasopharyngeal swab, presence of viral mutation(s) within the areas targeted  by this assay, and inadequate number of viral copies(<138 copies/mL). A negative result must be combined with clinical observations, patient history, and epidemiological information. The expected result is Negative.  Fact Sheet for Patients:  EntrepreneurPulse.com.au  Fact Sheet for Healthcare Providers:  IncredibleEmployment.be  This test is no t yet approved or cleared by the [place] FDA and  has been authorized for detection and/or diagnosis of SARS-CoV-2 by FDA under an Emergency Use Authorization (EUA). This EUA will remain  in effect (meaning this test can be used) for the duration of the COVID-19 declaration under Section 564(b)(1) of the Act, 21 U.S.C.section 360bbb-3(b)(1), unless the authorization is terminated  or revoked sooner.       Influenza A by PCR NEGATIVE NEGATIVE   Influenza B by PCR NEGATIVE NEGATIVE    Comment: (NOTE) The Xpert Xpress SARS-CoV-2/FLU/RSV plus assay is intended as an aid in the diagnosis of influenza from Nasopharyngeal swab specimens and should not be used as a sole basis for treatment. Nasal washings and aspirates are unacceptable for Xpert Xpress SARS-CoV-2/FLU/RSV testing.  Fact Sheet for Patients: EntrepreneurPulse.com.au  Fact Sheet for Healthcare Providers: IncredibleEmployment.be  This test is not yet approved or cleared by the [place] FDA and has been authorized for detection and/or diagnosis of SARS-CoV-2 by FDA under an Emergency Use Authorization (EUA). This EUA will remain in effect (meaning this test can be used) for the duration of the COVID-19 declaration under Section 564(b)(1) of the Act, 21 U.S.C. section 360bbb-3(b)(1), unless the authorization is terminated or revoked.     Resp Syncytial Virus by PCR NEGATIVE NEGATIVE    Comment: (NOTE) Fact Sheet for Patients: EntrepreneurPulse.com.au  Fact Sheet for Healthcare  Providers: IncredibleEmployment.be  This test is not yet approved or cleared by the [place] FDA and has been authorized for detection and/or diagnosis of SARS-CoV-2 by FDA under an Emergency Use Authorization (EUA). This EUA will remain in effect (meaning this test can be used) for the duration of the COVID-19 declaration under Section 564(b)(1) of the Act, 21 U.S.C. section 360bbb-3(b)(1), unless the authorization is terminated or revoked.  Performed at [facility], [place] [address] [place], [place] [address]   Brain natriuretic peptide     Status: None   Collection Time: 01/28/22  3:44 PM  Result Value Ref Range   B Natriuretic Peptide 32.2 0.0 - 100.0 pg/mL    Comment: Performed at [facility], [place] [address] [place], [place] [address]  Comprehensive metabolic panel     Status: Abnormal   Collection Time: 01/28/22  3:54 PM  Result Value Ref Range   Sodium 140 135 - 145 mmol/L   Potassium 3.3 (L) 3.5 - 5.1 mmol/L   Chloride 97 (L) 98 - 111 mmol/L  CO2 32 22 - 32 mmol/L   Glucose, Bld 128 (H) 70 - 99 mg/dL    Comment: Glucose reference range applies only to samples taken after fasting for at least 8 hours.   BUN 18 8 - 23 mg/dL   Creatinine, Ser 1.07 (H) 0.44 - 1.00 mg/dL   Calcium 9.1 8.9 - 10.3 mg/dL   Total Protein 7.5 6.5 - 8.1 g/dL   Albumin 3.1 (L) 3.5 - 5.0 g/dL   AST 76 (H) 15 - 41 U/L   ALT 95 (H) 0 - 44 U/L   Alkaline Phosphatase 80 38 - 126 U/L   Total Bilirubin 0.4 0.3 - 1.2 mg/dL   GFR, Estimated 54 (L) >60 mL/min    Comment: (NOTE) Calculated using the CKD-EPI Creatinine Equation (2021)    Anion gap 11 5 - 15    Comment: Performed at [facility], [place] [address] [place], [place] [address]  Troponin I (High Sensitivity)     Status: None   Collection Time: 01/28/22  3:54 PM  Result Value Ref Range   Troponin I (High Sensitivity) 4 <18 ng/L    Comment: (NOTE) Elevated high  sensitivity troponin I (hsTnI) values and significant  changes across serial measurements may suggest ACS but many other  chronic and acute conditions are known to elevate hsTnI results.  Refer to the "Links" section for chest pain algorithms and additional  guidance. Performed at [facility], [place] [address] [place], [place] [address]   CBC with Differential     Status: Abnormal   Collection Time: 01/28/22  3:54 PM  Result Value Ref Range   WBC 10.9 (H) 4.0 - 10.5 K/uL   RBC 4.13 3.87 - 5.11 MIL/uL   Hemoglobin 11.3 (L) 12.0 - 15.0 g/dL   HCT 37.7 36.0 - 46.0 %   MCV 91.3 80.0 - 100.0 fL   MCH 27.4 26.0 - 34.0 pg   MCHC 30.0 30.0 - 36.0 g/dL   RDW 14.6 11.5 - 15.5 %   Platelets 303 150 - 400 K/uL   nRBC 0.0 0.0 - 0.2 %   Neutrophils Relative % 74 %   Neutro Abs 8.1 (H) 1.7 - 7.7 K/uL   Lymphocytes Relative 9 %   Lymphs Abs 1.0 0.7 - 4.0 K/uL   Monocytes Relative 14 %   Monocytes Absolute 1.5 (H) 0.1 - 1.0 K/uL   Eosinophils Relative 1 %   Eosinophils Absolute 0.1 0.0 - 0.5 K/uL   Basophils Relative 1 %   Basophils Absolute 0.1 0.0 - 0.1 K/uL   Immature Granulocytes 1 %   Abs Immature Granulocytes 0.11 (H) 0.00 - 0.07 K/uL    Comment: Performed at [facility], [place] [address] [place], [place] [address]  Ginger Carne 8, ED     Status: Abnormal   Collection Time: 01/28/22  4:24 PM  Result Value Ref Range   Sodium 140 135 - 145 mmol/L   Potassium 3.4 (L) 3.5 - 5.1 mmol/L   Chloride 95 (L) 98 - 111 mmol/L   BUN 16 8 - 23 mg/dL   Creatinine, Ser 1.10 (H) 0.44 - 1.00 mg/dL   Glucose, Bld 126 (H) 70 - 99 mg/dL    Comment: Glucose reference range applies only to samples taken after fasting for at least 8 hours.   Calcium, Ion 1.15 1.15 - 1.40 mmol/L   TCO2 33 (H) 22 - 32 mmol/L   Hemoglobin 12.6 12.0 - 15.0 g/dL  HCT 37.0 36.0 - 46.0 %  Troponin I (High Sensitivity)     Status: None   Collection Time: 01/28/22  6:57 PM  Result Value  Ref Range   Troponin I (High Sensitivity) 6 <18 ng/L    Comment: (NOTE) Elevated high sensitivity troponin I (hsTnI) values and significant  changes across serial measurements may suggest ACS but many other  chronic and acute conditions are known to elevate hsTnI results.  Refer to the "Links" section for chest pain algorithms and additional  guidance. Performed at [facility], [place] [address], [place], [place] [address]    CT Angio Chest PE W/Cm &/Or Wo Cm  Result Date: 01/28/2022 CLINICAL DATA:  Pulmonary embolism (PE) suspected, high prob. History of non-small cell lung cancer EXAM: CT ANGIOGRAPHY CHEST WITH CONTRAST TECHNIQUE: Multidetector CT imaging of the chest was performed using the standard protocol during bolus administration of intravenous contrast. Multiplanar CT image reconstructions and MIPs were obtained to evaluate the vascular anatomy. RADIATION DOSE REDUCTION: This exam was performed according to the departmental dose-optimization program which includes automated exposure control, adjustment of the mA and/or kV according to patient size and/or use of iterative reconstruction technique. CONTRAST:  65mL OMNIPAQUE IOHEXOL 350 MG/ML SOLN COMPARISON:  01/07/2022 FINDINGS: Cardiovascular: Satisfactory opacification of the pulmonary arteries. No evidence of pulmonary embolism to the lobar branch level. Evaluation of the more distal pulmonary arterial branches is limited in the setting of respiratory motion artifact. Thoracic aorta is nonaneurysmal. Scattered atherosclerotic vascular calcifications of the aorta and coronary arteries. Prior sternotomy and CABG normal heart size. No pericardial effusion. Right chest port terminates at the level of the proximal right atrium. Mediastinum/Nodes: Prominent soft tissue in the left perihilar region is unchanged. Multiple mildly prominent mediastinal and right hilar lymph nodes have slightly increased the previous study, and may  be reactive. No axillary lymphadenopathy. Thyroid, trachea, and esophagus demonstrate no significant findings. Lungs/Pleura: Prior left upper lobectomy with associated volume loss in the left hemithorax. Persistent irregular appearing small left pleural effusion. Progressive interstitial and alveolar opacities throughout the left lower lobe. There are new predominantly ground-glass opacities with interlobular septal thickening throughout the right lung. No right-sided pleural effusion. No pneumothorax. Upper Abdomen: No acute findings within the included upper abdomen. Musculoskeletal: No chest wall abnormality. No acute or significant osseous findings. Review of the MIP images confirms the above findings. IMPRESSION: 1. No evidence of pulmonary embolism to the lobar branch level. 2. New predominantly ground-glass opacities with interlobular septal thickening throughout the right lung, which may represent pulmonary edema or atypical infection. 3. Post-treatment changes from prior left upper lobectomy. Persistent irregular appearing small left pleural effusion. Progressive interstitial and alveolar opacities throughout the left lower lobe, which may represent edema, infection, versus lymphangitic spread of tumor. 4. Multiple mildly prominent mediastinal and right hilar lymph nodes have slightly increased the previous study, and may be reactive or metastatic. Attention on follow-up. 5. Aortic and coronary artery atherosclerosis (ICD10-I70.0). Electronically Signed   By: Davina Poke D.O.   On: 01/28/2022 19:43   DG Chest Port 1 View  Result Date: 01/28/2022 CLINICAL DATA:  Pt arrived via POV, c/o low oxygen. States had follow up in office, told oxygen was mid 74s. Pt states she had her oxygen increased to 5L [place] at home recently. EXAM: PORTABLE CHEST - 1 VIEW COMPARISON:  08/29/2021 FINDINGS: Interval decrease in left pleural effusion. Improved aeration of the left lung base although moderate scattered  airspace opacities persist. There has  been some increase in ill-defined alveolar opacities at the right lung base. Stable right IJ power port to the distal SVC. CABG markers. Heart size and mediastinal contours are within normal limits. Aortic Atherosclerosis (ICD10-170.0). No pneumothorax. Sternotomy wires. IMPRESSION: 1. Interval decrease in left pleural effusion. 2. Worsening right lower lung airspace disease. Electronically Signed   By: Lucrezia Europe M.D.   On: 01/28/2022 16:19    Pending Labs Unresulted Labs (From admission, onward)     Start     Ordered   01/29/22 5498  Basic metabolic panel  Tomorrow morning,   R        01/28/22 2143   01/29/22 0500  CBC  Tomorrow morning,   R        01/28/22 2143   01/28/22 2142  MRSA Next Gen by PCR, Nasal  Once,   R        01/28/22 2141   01/28/22 2142  Strep pneumoniae urinary antigen  (COPD / Pneumonia / Cellulitis / Lower Extremity Wound)  Once,   R        01/28/22 2143   01/28/22 2142  Legionella Pneumophila Serogp 1 Ur Ag  (COPD / Pneumonia / Cellulitis / Lower Extremity Wound)  Once,   R        01/28/22 2143            Vitals/Pain Today's Vitals   01/28/22 1900 01/28/22 2021 01/28/22 2115 01/28/22 2130  BP: 121/61 (!) 114/58 (!) 112/58 (!) 111/49  Pulse:  78 80 79  Resp: (!) 28 16 14  (!) 24  Temp:  99.1 F (37.3 C)    TempSrc:      SpO2:  94% 95% 90%    Isolation Precautions No active isolations  Medications Medications  methylPREDNISolone sodium succinate (SOLU-MEDROL) 40 mg/mL injection 40 mg (has no administration in time range)  azithromycin (ZITHROMAX) 500 mg in sodium chloride 0.9 % 250 mL IVPB (has no administration in time range)  cefTRIAXone (ROCEPHIN) 1 g in sodium chloride 0.9 % 100 mL IVPB (has no administration in time range)  Chlorhexidine Gluconate Cloth 2 % PADS 6 each (has no administration in time range)  Oral care mouth rinse (has no administration in time range)  Oral care mouth rinse (has no  administration in time range)  enoxaparin (LOVENOX) injection 40 mg (has no administration in time range)  sodium chloride flush (NS) 0.9 % injection 3 mL (has no administration in time range)  acetaminophen (TYLENOL) tablet 650 mg (has no administration in time range)    Or  acetaminophen (TYLENOL) suppository 650 mg (has no administration in time range)  potassium chloride (KLOR-CON) packet 40 mEq (has no administration in time range)  ondansetron (ZOFRAN) tablet 4 mg (has no administration in time range)    Or  ondansetron (ZOFRAN) injection 4 mg (has no administration in time range)  senna-docusate (Senokot-S) tablet 1 tablet (has no administration in time range)  iohexol (OMNIPAQUE) 350 MG/ML injection 75 mL (75 mLs Intravenous Contrast Given 01/28/22 1915)  cefTRIAXone (ROCEPHIN) 1 g in sodium chloride 0.9 % 100 mL IVPB (0 g Intravenous Stopped 01/28/22 2105)  azithromycin (ZITHROMAX) 500 mg in sodium chloride 0.9 % 250 mL IVPB (0 mg Intravenous Stopped 01/28/22 2124)    Mobility walks with person assist

## 2022-01-28 NOTE — Assessment & Plan Note (Addendum)
Not much wheezing on exam.  Increased O2 requirement as above due to new right-sided pulmonary opacities. -Continue Bevespi and albuterol as needed -On IV steroids as above -Wean supplemental O2 as able

## 2022-01-28 NOTE — Hospital Course (Addendum)
76 year old F with PMH of COPD/chronic hypoxic RF on 3.5L recently increased to 5L, NSCLC of left lung s/p chemoradiation currently on immunotherapy, paroxysmal A-fib not on AC due to amyloid angiopathy of brain, hypothyroidism, hyperlipidemia and tobacco use disorder presented to PCP with lightheadedness, wooziness and increased oxygen requirement for about 5 days, and sent to ED due to acute on chronic respiratory failure with hypoxia with oxygen saturation in the range of 70 to 78% on 5 L by [place].   In ED, she initially required NRB but weaned down to 10 L by HFNC later.  ABG with mild hypercarbia.  BNP and serial troponin negative.  CTA chest negative for PE but new GGO opacities with interlobular septal thickening throughout the right lung, persistent irregular appearing small left pleural effusion, progressive interstitial alveolar opacity throughout the LLL and slightly increased multiple mildly prominent mediastinal and right healer lymph nodes.  Patient was started on IV ceftriaxone and Solu-Medrol for possible pneumonia/immunotherapy related pneumonitis.  Oncology consulted and recommended CAP coverage and higher dose of steroid.   Patient continues to require 10 L by HFNC but has no respiratory distress.  Pulmonology consulted.

## 2022-01-28 NOTE — Assessment & Plan Note (Addendum)
In sinus rhythm on admission with controlled rate.  Not on anticoagulation due to concern for high bleeding risk in setting of findings of amyloid angiopathy on prior MRI brain. -Continue Toprol-XL, amiodarone, and aspirin

## 2022-01-28 NOTE — ED Notes (Signed)
Accepting RN unable to get report at this time. Will try to call again in 10 mins.

## 2022-01-28 NOTE — Assessment & Plan Note (Signed)
Continue Synthroid °

## 2022-01-29 ENCOUNTER — Telehealth: Payer: Self-pay | Admitting: Nurse Practitioner

## 2022-01-29 DIAGNOSIS — E039 Hypothyroidism, unspecified: Secondary | ICD-10-CM

## 2022-01-29 DIAGNOSIS — E876 Hypokalemia: Secondary | ICD-10-CM

## 2022-01-29 DIAGNOSIS — J9622 Acute and chronic respiratory failure with hypercapnia: Secondary | ICD-10-CM | POA: Diagnosis not present

## 2022-01-29 DIAGNOSIS — J9621 Acute and chronic respiratory failure with hypoxia: Secondary | ICD-10-CM | POA: Diagnosis not present

## 2022-01-29 DIAGNOSIS — I480 Paroxysmal atrial fibrillation: Secondary | ICD-10-CM | POA: Diagnosis not present

## 2022-01-29 DIAGNOSIS — Z8679 Personal history of other diseases of the circulatory system: Secondary | ICD-10-CM

## 2022-01-29 DIAGNOSIS — C3492 Malignant neoplasm of unspecified part of left bronchus or lung: Secondary | ICD-10-CM | POA: Diagnosis not present

## 2022-01-29 DIAGNOSIS — E785 Hyperlipidemia, unspecified: Secondary | ICD-10-CM

## 2022-01-29 DIAGNOSIS — N179 Acute kidney failure, unspecified: Secondary | ICD-10-CM | POA: Insufficient documentation

## 2022-01-29 DIAGNOSIS — Z951 Presence of aortocoronary bypass graft: Secondary | ICD-10-CM

## 2022-01-29 LAB — BASIC METABOLIC PANEL
Anion gap: 8 (ref 5–15)
BUN: 15 mg/dL (ref 8–23)
CO2: 32 mmol/L (ref 22–32)
Calcium: 8.9 mg/dL (ref 8.9–10.3)
Chloride: 100 mmol/L (ref 98–111)
Creatinine, Ser: 1.04 mg/dL — ABNORMAL HIGH (ref 0.44–1.00)
GFR, Estimated: 56 mL/min — ABNORMAL LOW (ref >60)
Glucose, Bld: 141 mg/dL — ABNORMAL HIGH (ref 70–99)
Potassium: 4.8 mmol/L (ref 3.5–5.1)
Sodium: 140 mmol/L (ref 135–145)

## 2022-01-29 LAB — CBC
HCT: 36.6 % (ref 36.0–46.0)
Hemoglobin: 10.8 g/dL — ABNORMAL LOW (ref 12.0–15.0)
MCH: 27 pg (ref 26.0–34.0)
MCHC: 29.5 g/dL — ABNORMAL LOW (ref 30.0–36.0)
MCV: 91.5 fL (ref 80.0–100.0)
Platelets: 295 10*3/uL (ref 150–400)
RBC: 4 MIL/uL (ref 3.87–5.11)
RDW: 14.7 % (ref 11.5–15.5)
WBC: 9.2 10*3/uL (ref 4.0–10.5)
nRBC: 0 % (ref 0.0–0.2)

## 2022-01-29 LAB — STREP PNEUMONIAE URINARY ANTIGEN: Strep Pneumo Urinary Antigen: NEGATIVE

## 2022-01-29 LAB — MRSA NEXT GEN BY PCR, NASAL: MRSA by PCR Next Gen: NOT DETECTED

## 2022-01-29 MED ORDER — PNEUMOCOCCAL 20-VAL CONJ VACC 0.5 ML IM SUSY: 0.5000 mL | PREFILLED_SYRINGE | INTRAMUSCULAR | Status: DC

## 2022-01-29 MED ORDER — NICOTINE 21 MG/24HR TD PT24
21.0000 mg | MEDICATED_PATCH | Freq: Every day | TRANSDERMAL | Status: DC
  Administered 2022-01-29 – 2022-02-08 (×11): 21 mg via TRANSDERMAL
  Filled 2022-01-29 (×11): qty 1

## 2022-01-29 NOTE — Telephone Encounter (Signed)
Patient called to ask Mollie Germany to call her because she is in the hospital, since Monday, because her oxygen keeps dropping.  She specifically would like [place] to call her at [phone]

## 2022-01-29 NOTE — Progress Notes (Signed)
2000 patient alert x4 able to make all needs known call light in reach 2200 all meds given as ordered  2125 patient up to bath room SP02 dropped to 60s patient in resp distress patient educated that [facility] may be better patient says she wasn't to use regular bathroom 01/30/22 0630 patient will need weight when she gets up to use bathroom due to increase in 02 needs I feel its in the patients best interest to wait til she needs to get out of bed and not create more activity that will decrease oxygen

## 2022-01-29 NOTE — Plan of Care (Signed)
  Problem: Activity: Goal: Ability to tolerate increased activity will improve Outcome: Progressing   Problem: Clinical Measurements: Goal: Ability to maintain a body temperature in the normal range will improve Outcome: Progressing   Problem: Respiratory: Goal: Ability to maintain adequate ventilation will improve Outcome: Not Progressing Note: R/t sp02 in 60s with ambulation and decreases to low 80s when talking on 10L HFNC Goal: Ability to maintain a clear airway will improve Outcome: Progressing    Problem: Education: Goal: Knowledge of General Education information will improve Description: Including pain rating scale, medication(s)/side effects and non-pharmacologic comfort measures Outcome: Progressing   Problem: Health Behavior/Discharge Planning: Goal: Ability to manage health-related needs will improve Outcome: Progressing   Problem: Health Behavior/Discharge Planning: Goal: Ability to manage health-related needs will improve Outcome: Progressing   Problem: Clinical Measurements: Goal: Ability to maintain clinical measurements within normal limits will improve Outcome: Progressing Goal: Will remain free from infection Outcome: Progressing Goal: Diagnostic test results will improve Outcome: Progressing Goal: Respiratory complications will improve Outcome: Not Progressing Goal: Cardiovascular complication will be avoided Outcome: Progressing   Problem: Activity: Goal: Risk for activity intolerance will decrease Outcome: Progressing   Problem: Nutrition: Goal: Adequate nutrition will be maintained Outcome: Progressing   Problem: Coping: Goal: Level of anxiety will decrease Outcome: Progressing   Problem: Elimination: Goal: Will not experience complications related to bowel motility Outcome: Progressing Goal: Will not experience complications related to urinary retention Outcome: Progressing   Problem: Pain Managment: Goal: General experience of  comfort will improve Outcome: Progressing   Problem: Skin Integrity: Goal: Risk for impaired skin integrity will decrease Outcome: Progressing   Problem: Safety: Goal: Ability to remain free from injury will improve Outcome: Progressing

## 2022-01-29 NOTE — Progress Notes (Signed)
PROGRESS NOTE  [name] VWU:[id number] DOB: 01-07-1946   PCP: [name], NP  Patient is from: Home.  DOA: 01/28/2022 LOS: 1  Chief complaints Low oxygen, lightheadedness    Brief Narrative / Interim history: 76 year old F with PMH of COPD/chronic hypoxic RF on 2 to 3 L, NSCLC of left lung s/p chemoradiation currently on immunotherapy, paroxysmal A-fib not on AC due to amyloid angiopathy of brain, hypothyroidism, hyperlipidemia and tobacco use disorder presented to PCP with lightheadedness, wooziness and increased oxygen requirement for about 5 days, and sent to ED due to acute on chronic respiratory failure with hypoxia with oxygen saturation in the range of 70 to 78% on 5 L by nasal cannula.  In ED, she initially required NRB but weaned down to 3 L by HFNC later.  ABG with mild hypercarbia.  BNP and serial troponin negative.  CTA chest negative for PE but new GGO opacities with interlobular septal thickening throughout the right lung, persistent irregular appearing small left pleural effusion, progressive interstitial alveolar opacity throughout the LLL and slightly increased multiple mildly prominent mediastinal and right healer lymph nodes.  Patient was started on IV ceftriaxone and admitted.  Subjective: Seen and examined earlier this morning.  No major events overnight of this morning.  Desaturates to low 80s with minimal activity on 3 L by nasal cannula.  Denies shortness of breath.  Reports productive cough with whitish phlegm unchanged from baseline.  Denies chest pain, GI or UTI symptoms.  Patient's husband at bedside.  Objective: Vitals:   01/29/22 0753 01/29/22 0800 01/29/22 0900 01/29/22 1010  BP:  (!) 106/57 (!) 101/44 (!) 115/54  Pulse:  70 77 82  Resp:  (!) 21 (!) 24   Temp: (!) 97.2 F (36.2 C)     TempSrc: Axillary     SpO2:  95% (!) 89%   Weight:      Height:        Examination:  GENERAL: No apparent distress.  Nontoxic. HEENT: MMM.  Vision and  hearing grossly intact.  NECK: Supple.  No apparent JVD.  RESP:  No IWOB.  Fair aeration bilaterally. CVS:  RRR. Heart sounds normal.  ABD/GI/GU: BS+. Abd soft, NTND.  MSK/EXT:  Moves extremities. No apparent deformity. No edema.  SKIN: no apparent skin lesion or wound NEURO: Awake, alert and oriented appropriately.  No apparent focal neuro deficit. PSYCH: Calm. Normal affect.   Procedures:  None  Microbiology summarized: GNFAO-13, influenza and RSV PCR nonreactive.  Assessment and plan: Principal Problem:   Acute on chronic respiratory failure with hypoxia and hypercapnia (HCC) Active Problems:   COPD GOLD III   Non-small cell lung cancer (HCC)   Paroxysmal atrial fibrillation (HCC)   Dyslipidemia   Acquired hypothyroidism   Morbidly obese (HCC)   Hypokalemia   Hx of CABG   History of CAD (coronary artery disease)   AKI (acute kidney injury) ([place])  Acute on chronic respiratory failure with hypoxia and hypercapnia: Continues to require 10L at rest.  Easily desaturates with minimal activity is on 10 L.  On 2 to 3 L at baseline.  Surprisingly, she does not feel short of breath.cough is unchanged from baseline.  COVID-19, influenza and RSV PCR nonreactive.  Serial troponin and BNP are within normal.  Unfortunately, she continues to smoke about a pack a day.  CTA chest negative for PE but new GGO opacities with interlobular septal thickening throughout the right lung, persistent irregular appearing small left pleural effusion, progressive interstitial  alveolar opacity throughout the LLL and slightly increased multiple mildly prominent mediastinal and right healer lymph nodes.  She is at risk for pneumonia and a/or pneumonitis with immunotherapy.  -Hold immunotherapy. -Continue empiric IV ceftriaxone and azithromycin -Continue IV Solu-Medrol 40 mg BID -Continue supplemental O2 and wean as able -Continue home Lasix 20 mg daily -Incentive spirometry -Encouraged smoking cessation.   Nicotine patch ordered.   COPD GOLD III: No wheezing or significant rhonchi on exam.  Really no respiratory distress other than increased oxygen requirement.  -Continue Bevespi and albuterol as needed -On IV steroids as above -Wean supplemental O2 as able   Non-small cell lung cancer:follows with oncology, Dr. Curt Bears.  History of remote LUL lobectomy.  S/p recent chemoradiation now on active immunotherapy with Durvalumab. -Dr. Julien Nordmann added to treatment team -Hold immunotherapy   Paroxysmal atrial fibrillation: In sinus rhythm.  Not on anticoagulation due to concern for high bleeding risk in the setting of cerebral amyloid angiopathy. -Continue Toprol-XL, amiodarone, and aspirin  History of CAD/CABG/stent: Stable. -Continue home medication  AKI: improving.  Recent Labs    09/03/21 0758 09/10/21 1129 09/17/21 1047 10/18/21 1209 11/15/21 0812 12/13/21 0932 01/14/22 0925 01/28/22 1554 01/28/22 1624 01/29/22 0407  BUN 10 9 11 10 12 11 15 18 16 15   CREATININE 0.75 0.76 0.84 0.71 0.75 0.89 0.88 1.07* 1.10* 1.04*  -Continue monitoringContinue monitoring.     Hypokalemia: Resolved.   Acquired hypothyroidism -Continue Synthroid.   Dyslipidemia -Continue rosuvastatin.  Tobacco use disorder: Reports smoking about a pack a day. -Encouraged cessation  Goal of care discussion: Prefers to remain full code but does not want to have life prolonged on a ventilator if unsuccessful.  Designate her husband as Ambulance person if she is not able to  Morbid obesity Body mass index is 37.06 kg/m.           DVT prophylaxis:  enoxaparin (LOVENOX) injection 40 mg Start: 01/28/22 2200  Code Status: Full code Family Communication: Updated patient's husband at bedside Level of care: Stepdown Status is: Inpatient Remains inpatient appropriate because: Acute on chronic respiratory failure with hypoxia and hypercapnia   Final disposition: TBD Consultants:   Oncology  Sch Meds:  Scheduled Meds:  amiodarone  100 mg Oral Daily   arformoterol  15 mcg Nebulization BID   And   umeclidinium bromide  1 puff Inhalation Daily   aspirin  81 mg Oral QHS   Chlorhexidine Gluconate Cloth  6 each Topical Daily   enoxaparin (LOVENOX) injection  40 mg Subcutaneous Q24H   furosemide  20 mg Oral Daily   levothyroxine  12.5 mcg Oral Once per day on Mon Tue Wed Thu Fri Sat   levothyroxine  50 mcg Oral Q0600   methylPREDNISolone (SOLU-MEDROL) injection  40 mg Intravenous Q12H   metoprolol succinate  100 mg Oral Daily   nicotine  21 mg Transdermal Daily   mouth rinse  15 mL Mouth Rinse 4 times per day   [START ON 01/30/2022] pneumococcal 20-valent conjugate vaccine  0.5 mL Intramuscular Tomorrow-1000   potassium chloride  10 mEq Oral QODAY   rosuvastatin  40 mg Oral QHS   sodium chloride flush  3 mL Intravenous Q12H   Continuous Infusions:  azithromycin (ZITHROMAX) 500 mg in sodium chloride 0.9 % 250 mL IVPB     cefTRIAXone (ROCEPHIN)  IV     PRN Meds:.acetaminophen **OR** acetaminophen, albuterol, ondansetron **OR** ondansetron (ZOFRAN) IV, mouth rinse, senna-docusate  Antimicrobials: Anti-infectives (From admission, onward)  Start     Dose/Rate Route Frequency Ordered Stop   01/29/22 2000  azithromycin (ZITHROMAX) 500 mg in sodium chloride 0.9 % 250 mL IVPB        500 mg 250 mL/hr over 60 Minutes Intravenous Every 24 hours 01/28/22 2140     01/29/22 2000  cefTRIAXone (ROCEPHIN) 1 g in sodium chloride 0.9 % 100 mL IVPB        1 g 200 mL/hr over 30 Minutes Intravenous Every 24 hours 01/28/22 2140     01/28/22 2030  cefTRIAXone (ROCEPHIN) 1 g in sodium chloride 0.9 % 100 mL IVPB        1 g 200 mL/hr over 30 Minutes Intravenous  Once 01/28/22 2016 01/28/22 2105   01/28/22 2030  azithromycin (ZITHROMAX) 500 mg in sodium chloride 0.9 % 250 mL IVPB        500 mg 250 mL/hr over 60 Minutes Intravenous  Once 01/28/22 2016 01/28/22 2124        I  have personally reviewed the following labs and images: CBC: Recent Labs  Lab 01/28/22 1554 01/28/22 1624 01/29/22 0407  WBC 10.9*  --  9.2  NEUTROABS 8.1*  --   --   HGB 11.3* 12.6 10.8*  HCT 37.7 37.0 36.6  MCV 91.3  --  91.5  PLT 303  --  295   BMP &GFR Recent Labs  Lab 01/28/22 1554 01/28/22 1624 01/29/22 0407  NA 140 140 140  K 3.3* 3.4* 4.8  CL 97* 95* 100  CO2 32  --  32  GLUCOSE 128* 126* 141*  BUN 18 16 15   CREATININE 1.07* 1.10* 1.04*  CALCIUM 9.1  --  8.9   Estimated Creatinine Clearance: 51.2 mL/min (A) (by C-G formula based on SCr of 1.04 mg/dL (H)). Liver & Pancreas: Recent Labs  Lab 01/28/22 1554  AST 76*  ALT 95*  ALKPHOS 80  BILITOT 0.4  PROT 7.5  ALBUMIN 3.1*   No results for input(s): "LIPASE", "AMYLASE" in the last 168 hours. No results for input(s): "AMMONIA" in the last 168 hours. Diabetic: No results for input(s): "HGBA1C" in the last 72 hours. No results for input(s): "GLUCAP" in the last 168 hours. Cardiac Enzymes: No results for input(s): "CKTOTAL", "CKMB", "CKMBINDEX", "TROPONINI" in the last 168 hours. No results for input(s): "PROBNP" in the last 8760 hours. Coagulation Profile: No results for input(s): "INR", "PROTIME" in the last 168 hours. Thyroid Function Tests: No results for input(s): "TSH", "T4TOTAL", "FREET4", "T3FREE", "THYROIDAB" in the last 72 hours. Lipid Profile: No results for input(s): "CHOL", "HDL", "LDLCALC", "TRIG", "CHOLHDL", "LDLDIRECT" in the last 72 hours. Anemia Panel: No results for input(s): "VITAMINB12", "FOLATE", "FERRITIN", "TIBC", "IRON", "RETICCTPCT" in the last 72 hours. Urine analysis:    Component Value Date/Time   COLORURINE YELLOW 08/09/2021 0007   APPEARANCEUR HAZY (A) 08/09/2021 0007   LABSPEC 1.006 08/09/2021 0007   PHURINE 7.0 08/09/2021 0007   GLUCOSEU NEGATIVE 08/09/2021 0007   HGBUR NEGATIVE 08/09/2021 0007   BILIRUBINUR NEGATIVE 08/09/2021 0007   BILIRUBINUR Negative 07/24/2020  1623   KETONESUR NEGATIVE 08/09/2021 0007   PROTEINUR 30 (A) 08/09/2021 0007   UROBILINOGEN 0.2 07/24/2020 1623   UROBILINOGEN 0.2 09/13/2010 0906   NITRITE NEGATIVE 08/09/2021 0007   LEUKOCYTESUR MODERATE (A) 08/09/2021 0007   Sepsis Labs: Invalid input(s): "PROCALCITONIN", "LACTICIDVEN"  Microbiology: Recent Results (from the past 240 hour(s))  Resp panel by RT-PCR (RSV, Flu A&B, Covid) Anterior Nasal Swab     Status: None  Collection Time: 01/28/22  3:44 PM   Specimen: Anterior Nasal Swab  Result Value Ref Range Status   SARS Coronavirus 2 by RT PCR NEGATIVE NEGATIVE Final    Comment: (NOTE) SARS-CoV-2 target nucleic acids are NOT DETECTED.  The SARS-CoV-2 RNA is generally detectable in upper respiratory specimens during the acute phase of infection. The lowest concentration of SARS-CoV-2 viral copies this assay can detect is 138 copies/mL. A negative result does not preclude SARS-Cov-2 infection and should not be used as the sole basis for treatment or other patient management decisions. A negative result may occur with  improper specimen collection/handling, submission of specimen other than nasopharyngeal swab, presence of viral mutation(s) within the areas targeted by this assay, and inadequate number of viral copies(<138 copies/mL). A negative result must be combined with clinical observations, patient history, and epidemiological information. The expected result is Negative.  Fact Sheet for Patients:  EntrepreneurPulse.com.au  Fact Sheet for Healthcare Providers:  IncredibleEmployment.be  This test is no t yet approved or cleared by the [place] FDA and  has been authorized for detection and/or diagnosis of SARS-CoV-2 by FDA under an Emergency Use Authorization (EUA). This EUA will remain  in effect (meaning this test can be used) for the duration of the COVID-19 declaration under Section 564(b)(1) of the Act,  21 U.S.C.section 360bbb-3(b)(1), unless the authorization is terminated  or revoked sooner.       Influenza A by PCR NEGATIVE NEGATIVE Final   Influenza B by PCR NEGATIVE NEGATIVE Final    Comment: (NOTE) The Xpert Xpress SARS-CoV-2/FLU/RSV plus assay is intended as an aid in the diagnosis of influenza from Nasopharyngeal swab specimens and should not be used as a sole basis for treatment. Nasal washings and aspirates are unacceptable for Xpert Xpress SARS-CoV-2/FLU/RSV testing.  Fact Sheet for Patients: EntrepreneurPulse.com.au  Fact Sheet for Healthcare Providers: IncredibleEmployment.be  This test is not yet approved or cleared by the [place] FDA and has been authorized for detection and/or diagnosis of SARS-CoV-2 by FDA under an Emergency Use Authorization (EUA). This EUA will remain in effect (meaning this test can be used) for the duration of the COVID-19 declaration under Section 564(b)(1) of the Act, 21 U.S.C. section 360bbb-3(b)(1), unless the authorization is terminated or revoked.     Resp Syncytial Virus by PCR NEGATIVE NEGATIVE Final    Comment: (NOTE) Fact Sheet for Patients: EntrepreneurPulse.com.au  Fact Sheet for Healthcare Providers: IncredibleEmployment.be  This test is not yet approved or cleared by the [place] FDA and has been authorized for detection and/or diagnosis of SARS-CoV-2 by FDA under an Emergency Use Authorization (EUA). This EUA will remain in effect (meaning this test can be used) for the duration of the COVID-19 declaration under Section 564(b)(1) of the Act, 21 U.S.C. section 360bbb-3(b)(1), unless the authorization is terminated or revoked.  Performed at [facility], [place] [address], [place], [place] [address]   MRSA Next Gen by PCR, Nasal     Status: None   Collection Time: 01/28/22 10:28 PM  Result Value Ref Range Status   MRSA  by PCR Next Gen NOT DETECTED NOT DETECTED Final    Comment: (NOTE) The GeneXpert MRSA Assay (FDA approved for NASAL specimens only), is one component of a comprehensive MRSA colonization surveillance program. It is not intended to diagnose MRSA infection nor to guide or monitor treatment for MRSA infections. Test performance is not FDA approved in patients less than 42 years old. Performed at [facility],  [place] [address] [place], [place] [address]     Radiology Studies: CT Angio Chest PE W/Cm &/Or Wo Cm  Result Date: 01/28/2022 CLINICAL DATA:  Pulmonary embolism (PE) suspected, high prob. History of non-small cell lung cancer EXAM: CT ANGIOGRAPHY CHEST WITH CONTRAST TECHNIQUE: Multidetector CT imaging of the chest was performed using the standard protocol during bolus administration of intravenous contrast. Multiplanar CT image reconstructions and MIPs were obtained to evaluate the vascular anatomy. RADIATION DOSE REDUCTION: This exam was performed according to the departmental dose-optimization program which includes automated exposure control, adjustment of the mA and/or kV according to patient size and/or use of iterative reconstruction technique. CONTRAST:  39mL OMNIPAQUE IOHEXOL 350 MG/ML SOLN COMPARISON:  01/07/2022 FINDINGS: Cardiovascular: Satisfactory opacification of the pulmonary arteries. No evidence of pulmonary embolism to the lobar branch level. Evaluation of the more distal pulmonary arterial branches is limited in the setting of respiratory motion artifact. Thoracic aorta is nonaneurysmal. Scattered atherosclerotic vascular calcifications of the aorta and coronary arteries. Prior sternotomy and CABG normal heart size. No pericardial effusion. Right chest port terminates at the level of the proximal right atrium. Mediastinum/Nodes: Prominent soft tissue in the left perihilar region is unchanged. Multiple mildly prominent mediastinal and right hilar lymph nodes have  slightly increased the previous study, and may be reactive. No axillary lymphadenopathy. Thyroid, trachea, and esophagus demonstrate no significant findings. Lungs/Pleura: Prior left upper lobectomy with associated volume loss in the left hemithorax. Persistent irregular appearing small left pleural effusion. Progressive interstitial and alveolar opacities throughout the left lower lobe. There are new predominantly ground-glass opacities with interlobular septal thickening throughout the right lung. No right-sided pleural effusion. No pneumothorax. Upper Abdomen: No acute findings within the included upper abdomen. Musculoskeletal: No chest wall abnormality. No acute or significant osseous findings. Review of the MIP images confirms the above findings. IMPRESSION: 1. No evidence of pulmonary embolism to the lobar branch level. 2. New predominantly ground-glass opacities with interlobular septal thickening throughout the right lung, which may represent pulmonary edema or atypical infection. 3. Post-treatment changes from prior left upper lobectomy. Persistent irregular appearing small left pleural effusion. Progressive interstitial and alveolar opacities throughout the left lower lobe, which may represent edema, infection, versus lymphangitic spread of tumor. 4. Multiple mildly prominent mediastinal and right hilar lymph nodes have slightly increased the previous study, and may be reactive or metastatic. Attention on follow-up. 5. Aortic and coronary artery atherosclerosis (ICD10-I70.0). Electronically Signed   By: Davina Poke D.O.   On: 01/28/2022 19:43   DG Chest Port 1 View  Result Date: 01/28/2022 CLINICAL DATA:  Pt arrived via POV, c/o low oxygen. States had follow up in office, told oxygen was mid 30s. Pt states she had her oxygen increased to 5L [place] at home recently. EXAM: PORTABLE CHEST - 1 VIEW COMPARISON:  08/29/2021 FINDINGS: Interval decrease in left pleural effusion. Improved aeration of the  left lung base although moderate scattered airspace opacities persist. There has been some increase in ill-defined alveolar opacities at the right lung base. Stable right IJ power port to the distal SVC. CABG markers. Heart size and mediastinal contours are within normal limits. Aortic Atherosclerosis (ICD10-170.0). No pneumothorax. Sternotomy wires. IMPRESSION: 1. Interval decrease in left pleural effusion. 2. Worsening right lower lung airspace disease. Electronically Signed   By: Lucrezia Europe M.D.   On: 01/28/2022 16:19      Adrienna Karis T. [place]  If 7PM-7AM, please contact night-coverage [email or web address] 01/29/2022, 12:20 PM

## 2022-01-29 NOTE — TOC Progression Note (Signed)
Transition of Care [facility]) - Progression Note    Patient Details  Name: [name] MRN: [id number] Date of Birth: 06-18-1945  Transition of Care [facility]) CM/SW Contact  Servando Snare, [place] Phone Number: 01/29/2022, 9:44 AM  Clinical Narrative:     Transition of Care [facility]) Screening Note   Patient Details  Name: [name] Date of Birth: [birth year]   Transition of Care [facility]) CM/SW Contact:    Servando Snare, LCSW Phone Number: 01/29/2022, 9:45 AM    Transition of Care Department [facility]) has reviewed patient and no TOC needs have been identified at this time. We will continue to monitor patient advancement through interdisciplinary progression rounds. If new patient transition needs arise, please place a TOC consult.          Expected Discharge Plan and Services                                                 Social Determinants of Health (SDOH) Interventions Housing Interventions: Intervention Not Indicated  Readmission Risk Interventions     No data to display

## 2022-01-29 NOTE — Telephone Encounter (Signed)
Called and spoke w/ pt she wanted Katie to be aware that she is currently admitted, she reported to me that they have her on 10L/m O2 to keep sats above 88%. I told her when she was d/c to be sure and make a hospital f/u with our office  Just a small FYI for you [place].

## 2022-01-29 NOTE — Progress Notes (Signed)
DIAGNOSIS:  Stage IIIa (T3, N2, M0) non-small cell lung cancer, squamous cell carcinoma presented with left hilar mass with suspicious mediastinal lymphadenopathy on the last CT scan of the chest at [place].   PRIOR THERAPY: A course of concurrent chemoradiation with weekly carboplatin for AUC of 2 and paclitaxel 45 Mg/M2.  Status post 6 cycles.   CURRENT THERAPY: Consolidation treatment with immunotherapy with Imfinzi 1500 Mg IV every 4 weeks.  First dose October 18, 2021.  Status post 5 cycle.  Subjective: The patient is seen and examined today. Her husband was at the bedside.  The patient is currently on treatment with immunotherapy with Imfinzi every 4 weeks status post 5 cycles.  She has been tolerating this treatment well with no concerning adverse effects but she was admitted to the hospital with worsening dyspnea.  She had CT angiogram of the chest performed on January 28, 2022 and that showed no evidence for pulmonary embolism but there was new predominantly groundglass opacities with interlobular septal thickening throughout the right lung which may represent pulmonary edema or atypical infection.  There was also progressive interstitial and alveolar opacities throughout the left lower lobe again suspicious for edema/infection versus lymphangitic spread but this finding could be also secondary to immunotherapy mediated pneumonitis.  She is currently on treatment with azithromycin as well as Rocephin.  She is also on Solu-Medrol.  The patient was also treated with furosemide for the suspicious pulmonary edema. No chest pain, nausea or vomiting, abdominal pain or diarrhea.   Objective: Vital signs in last 24 hours: Temp:  [97.2 F (36.2 C)-98.8 F (37.1 C)] 97.3 F (36.3 C) (12/12 2000) Pulse Rate:  [67-82] 71 (12/12 2000) Resp:  [12-37] 37 (12/12 2000) BP: (101-129)/(38-82) 122/51 (12/12 2000) SpO2:  [84 %-100 %] 98 % (12/12 2000) Weight:  [208 lb 1.8 oz (94.4 kg)-209 lb 3.5 oz  (94.9 kg)] 209 lb 3.5 oz (94.9 kg) (12/12 0440)  Intake/Output from previous day: 12/11 0701 - 12/12 0700 In: 350 [IV Piggyback:350] Out: -  Intake/Output this shift: No intake/output data recorded.  General appearance: alert, cooperative, fatigued, and no distress Resp: rales bilaterally Cardio: regular rate and rhythm, S1, S2 normal, no murmur, click, rub or gallop GI: soft, non-tender; bowel sounds normal; no masses,  no organomegaly Extremities: extremities normal, atraumatic, no cyanosis or edema  Lab Results:  Recent Labs    01/28/22 1554 01/28/22 1624 01/29/22 0407  WBC 10.9*  --  9.2  HGB 11.3* 12.6 10.8*  HCT 37.7 37.0 36.6  PLT 303  --  295   BMET Recent Labs    01/28/22 1554 01/28/22 1624 01/29/22 0407  NA 140 140 140  K 3.3* 3.4* 4.8  CL 97* 95* 100  CO2 32  --  32  GLUCOSE 128* 126* 141*  BUN 18 16 15   CREATININE 1.07* 1.10* 1.04*  CALCIUM 9.1  --  8.9    Studies/Results: CT Angio Chest PE W/Cm &/Or Wo Cm  Result Date: 01/28/2022 CLINICAL DATA:  Pulmonary embolism (PE) suspected, high prob. History of non-small cell lung cancer EXAM: CT ANGIOGRAPHY CHEST WITH CONTRAST TECHNIQUE: Multidetector CT imaging of the chest was performed using the standard protocol during bolus administration of intravenous contrast. Multiplanar CT image reconstructions and MIPs were obtained to evaluate the vascular anatomy. RADIATION DOSE REDUCTION: This exam was performed according to the departmental dose-optimization program which includes automated exposure control, adjustment of the mA and/or kV according to patient size and/or use of iterative reconstruction  technique. CONTRAST:  41mL OMNIPAQUE IOHEXOL 350 MG/ML SOLN COMPARISON:  01/07/2022 FINDINGS: Cardiovascular: Satisfactory opacification of the pulmonary arteries. No evidence of pulmonary embolism to the lobar branch level. Evaluation of the more distal pulmonary arterial branches is limited in the setting of  respiratory motion artifact. Thoracic aorta is nonaneurysmal. Scattered atherosclerotic vascular calcifications of the aorta and coronary arteries. Prior sternotomy and CABG normal heart size. No pericardial effusion. Right chest port terminates at the level of the proximal right atrium. Mediastinum/Nodes: Prominent soft tissue in the left perihilar region is unchanged. Multiple mildly prominent mediastinal and right hilar lymph nodes have slightly increased the previous study, and may be reactive. No axillary lymphadenopathy. Thyroid, trachea, and esophagus demonstrate no significant findings. Lungs/Pleura: Prior left upper lobectomy with associated volume loss in the left hemithorax. Persistent irregular appearing small left pleural effusion. Progressive interstitial and alveolar opacities throughout the left lower lobe. There are new predominantly ground-glass opacities with interlobular septal thickening throughout the right lung. No right-sided pleural effusion. No pneumothorax. Upper Abdomen: No acute findings within the included upper abdomen. Musculoskeletal: No chest wall abnormality. No acute or significant osseous findings. Review of the MIP images confirms the above findings. IMPRESSION: 1. No evidence of pulmonary embolism to the lobar branch level. 2. New predominantly ground-glass opacities with interlobular septal thickening throughout the right lung, which may represent pulmonary edema or atypical infection. 3. Post-treatment changes from prior left upper lobectomy. Persistent irregular appearing small left pleural effusion. Progressive interstitial and alveolar opacities throughout the left lower lobe, which may represent edema, infection, versus lymphangitic spread of tumor. 4. Multiple mildly prominent mediastinal and right hilar lymph nodes have slightly increased the previous study, and may be reactive or metastatic. Attention on follow-up. 5. Aortic and coronary artery atherosclerosis  (ICD10-I70.0). Electronically Signed   By: Davina Poke D.O.   On: 01/28/2022 19:43   DG Chest Port 1 View  Result Date: 01/28/2022 CLINICAL DATA:  Pt arrived via POV, c/o low oxygen. States had follow up in office, told oxygen was mid 2s. Pt states she had her oxygen increased to 5L [place] at home recently. EXAM: PORTABLE CHEST - 1 VIEW COMPARISON:  08/29/2021 FINDINGS: Interval decrease in left pleural effusion. Improved aeration of the left lung base although moderate scattered airspace opacities persist. There has been some increase in ill-defined alveolar opacities at the right lung base. Stable right IJ power port to the distal SVC. CABG markers. Heart size and mediastinal contours are within normal limits. Aortic Atherosclerosis (ICD10-170.0). No pneumothorax. Sternotomy wires. IMPRESSION: 1. Interval decrease in left pleural effusion. 2. Worsening right lower lung airspace disease. Electronically Signed   By: Lucrezia Europe M.D.   On: 01/28/2022 16:19    Medications: I have reviewed the patient's current medications.   Assessment/Plan: This is a very pleasant 76 years old white female with stage IIIa non-small cell lung cancer, squamous cell carcinoma status post a course of concurrent chemoradiation with weekly carboplatin and paclitaxel for 6 cycles with partial response and currently undergoing consolidation treatment with immunotherapy with Imfinzi status post 5 cycles.  She has been tolerating this treatment fairly well except for the recent admission with shortness of breath and suspicious immunotherapy mediated pneumonitis versus pulmonary edema versus atypical infection. I agree with the current plan with the high-dose steroids that need to be tapered over the next several weeks.  We may need to increase her dose of steroid to the equivalent of prednisone 1 mg/kg and taper it slowly over  several weeks.  I will continue to hold her treatment with immunotherapy for now. Will also continue  with her current treatment with azithromycin and Rocephin for the possibility infection as well as furosemide for the concern about pulmonary edema. I will arrange for the patient a follow-up appointment with me after discharge for more discussion of her treatment options.   LOS: 1 day    Eilleen Kempf 01/29/2022

## 2022-01-30 DIAGNOSIS — C3492 Malignant neoplasm of unspecified part of left bronchus or lung: Secondary | ICD-10-CM | POA: Diagnosis not present

## 2022-01-30 DIAGNOSIS — J449 Chronic obstructive pulmonary disease, unspecified: Secondary | ICD-10-CM

## 2022-01-30 DIAGNOSIS — E039 Hypothyroidism, unspecified: Secondary | ICD-10-CM | POA: Diagnosis not present

## 2022-01-30 DIAGNOSIS — I480 Paroxysmal atrial fibrillation: Secondary | ICD-10-CM | POA: Diagnosis not present

## 2022-01-30 DIAGNOSIS — J9621 Acute and chronic respiratory failure with hypoxia: Secondary | ICD-10-CM | POA: Diagnosis not present

## 2022-01-30 DIAGNOSIS — D72825 Bandemia: Secondary | ICD-10-CM

## 2022-01-30 DIAGNOSIS — R739 Hyperglycemia, unspecified: Secondary | ICD-10-CM

## 2022-01-30 LAB — RENAL FUNCTION PANEL
Albumin: 2.6 g/dL — ABNORMAL LOW (ref 3.5–5.0)
Anion gap: 7 (ref 5–15)
BUN: 19 mg/dL (ref 8–23)
CO2: 31 mmol/L (ref 22–32)
Calcium: 9.3 mg/dL (ref 8.9–10.3)
Chloride: 104 mmol/L (ref 98–111)
Creatinine, Ser: 1.07 mg/dL — ABNORMAL HIGH (ref 0.44–1.00)
GFR, Estimated: 54 mL/min — ABNORMAL LOW (ref >60)
Glucose, Bld: 148 mg/dL — ABNORMAL HIGH (ref 70–99)
Phosphorus: 4 mg/dL (ref 2.5–4.6)
Potassium: 4.8 mmol/L (ref 3.5–5.1)
Sodium: 142 mmol/L (ref 135–145)

## 2022-01-30 LAB — CBC
HCT: 36.7 % (ref 36.0–46.0)
Hemoglobin: 10.9 g/dL — ABNORMAL LOW (ref 12.0–15.0)
MCH: 26.8 pg (ref 26.0–34.0)
MCHC: 29.7 g/dL — ABNORMAL LOW (ref 30.0–36.0)
MCV: 90.4 fL (ref 80.0–100.0)
Platelets: 365 10*3/uL (ref 150–400)
RBC: 4.06 MIL/uL (ref 3.87–5.11)
RDW: 14.6 % (ref 11.5–15.5)
WBC: 19.6 10*3/uL — ABNORMAL HIGH (ref 4.0–10.5)
nRBC: 0 % (ref 0.0–0.2)

## 2022-01-30 LAB — MAGNESIUM: Magnesium: 2.4 mg/dL (ref 1.7–2.4)

## 2022-01-30 LAB — LEGIONELLA PNEUMOPHILA SEROGP 1 UR AG: L. pneumophila Serogp 1 Ur Ag: NEGATIVE

## 2022-01-30 MED ORDER — SODIUM CHLORIDE 0.9 % IV SOLN
2.0000 g | INTRAVENOUS | Status: DC
  Administered 2022-01-30 – 2022-02-03 (×5): 2 g via INTRAVENOUS
  Filled 2022-01-30 (×5): qty 20

## 2022-01-30 MED ORDER — METHYLPREDNISOLONE SODIUM SUCC 125 MG IJ SOLR
80.0000 mg | Freq: Two times a day (BID) | INTRAMUSCULAR | Status: DC
  Administered 2022-01-30 – 2022-02-04 (×11): 80 mg via INTRAVENOUS
  Filled 2022-01-30 (×11): qty 2

## 2022-01-30 NOTE — Progress Notes (Signed)
PROGRESS NOTE  [name] XBW:[id number] DOB: 03-03-1945   PCP: [name], NP  Patient is from: Home.  DOA: 01/28/2022 LOS: 2  Chief complaints Low oxygen, lightheadedness    Brief Narrative / Interim history: 76 year old F with PMH of COPD/chronic hypoxic RF on 2 to 3 L, NSCLC of left lung s/p chemoradiation currently on immunotherapy, paroxysmal A-fib not on AC due to amyloid angiopathy of brain, hypothyroidism, hyperlipidemia and tobacco use disorder presented to PCP with lightheadedness, wooziness and increased oxygen requirement for about 5 days, and sent to ED due to acute on chronic respiratory failure with hypoxia with oxygen saturation in the range of 70 to 78% on 5 L by nasal cannula.  In ED, she initially required NRB but weaned down to 3 L by HFNC later.  ABG with mild hypercarbia.  BNP and serial troponin negative.  CTA chest negative for PE but new GGO opacities with interlobular septal thickening throughout the right lung, persistent irregular appearing small left pleural effusion, progressive interstitial alveolar opacity throughout the LLL and slightly increased multiple mildly prominent mediastinal and right healer lymph nodes.  Patient was started on IV ceftriaxone and admitted.  Oncology following.  Requiring 12 L by HFNC.  Subjective: Seen and examined earlier this morning.  No major events overnight of this morning.  No specific complaints.  She denies shortness of breath but is requiring 12 L by HFNC.  No significant change in her cough.  Denies chest pain, GI or UTI symptoms.   Objective: Vitals:   01/30/22 0500 01/30/22 0600 01/30/22 0606 01/30/22 0900  BP: (!) 141/52 112/60    Pulse: 64 63    Resp: 19 (!) 26    Temp:    98 F (36.7 C)  TempSrc:    Oral  SpO2: 94% 93% 97%   Weight:      Height:        Examination:   GENERAL: No apparent distress.  Nontoxic. HEENT: MMM.  Vision and hearing grossly intact.  NECK: Supple.  No apparent JVD.   RESP:  No IWOB.  Fair aeration bilaterally. CVS:  RRR. Heart sounds normal.  Some bilateral crackles. ABD/GI/GU: BS+. Abd soft, NTND.  MSK/EXT:  Moves extremities. No apparent deformity. No edema.  SKIN: no apparent skin lesion or wound NEURO: Awake and alert. Oriented appropriately.  No apparent focal neuro deficit. PSYCH: Calm. Normal affect.   Procedures:  None  Microbiology summarized: [id number], influenza and RSV PCR nonreactive.  Assessment and plan: Principal Problem:   Acute on chronic respiratory failure with hypoxia and hypercapnia (HCC) Active Problems:   COPD GOLD III   Non-small cell lung cancer (HCC)   Paroxysmal atrial fibrillation (HCC)   Dyslipidemia   Acquired hypothyroidism   Morbidly obese (HCC)   Hypokalemia   Hx of CABG   History of CAD (coronary artery disease)   AKI (acute kidney injury) ([place])  Acute on chronic respiratory failure with hypoxia and hypercapnia: Requiring 12 L.  On 2 to 3 L at baseline.  Surprisingly, she does not feel short of breath.cough is unchanged from baseline.  COVID-19, influenza and RSV PCR nonreactive.  Serial troponin and BNP are within normal.  Unfortunately, she continues to smoke about a pack a day.  CTA chest negative for PE but new GGO opacities with interlobular septal thickening throughout the right lung, persistent irregular appearing small left pleural effusion, progressive interstitial alveolar opacity throughout the LLL and slightly increased multiple mildly prominent mediastinal and right  healer lymph nodes.  She is at risk for pneumonia and/or pneumonitis with immunotherapy.  -Hold immunotherapy. -Increased ceftriaxone to 2 g daily -Increased Solu-Medrol to 80 mg twice daily -Continue azithromycin -Continue supplemental O2 and wean as able -Continue home Lasix 20 mg daily -Incentive spirometry -Encouraged smoking cessation.  Nicotine patch ordered.   COPD GOLD III: No wheezing or significant rhonchi on exam.   Really no respiratory distress other than increased oxygen requirement.  -Continue Bevespi and albuterol as needed -Steroid as above. -Wean supplemental O2 as able   Non-small cell lung cancer:follows with oncology, Dr. Curt Bears.  History of remote LUL lobectomy.  S/p recent chemoradiation now on active immunotherapy with Durvalumab. -Oncology following.  Appreciate input. -Hold immunotherapy   Paroxysmal atrial fibrillation: In sinus rhythm.  Not on anticoagulation due to concern for high bleeding risk in the setting of cerebral amyloid angiopathy. -Continue Toprol-XL, amiodarone, and aspirin  History of CAD/CABG/stent: Stable. -Continue home medication  AKI: improving.  Recent Labs    09/10/21 1129 09/17/21 1047 10/18/21 1209 11/15/21 0812 12/13/21 0932 01/14/22 0925 01/28/22 1554 01/28/22 1624 01/29/22 0407 01/30/22 0408  BUN 9 11 10 12 11 15 18 16 15 19   CREATININE 0.76 0.84 0.71 0.75 0.89 0.88 1.07* 1.10* 1.04* 1.07*  -Continue monitoringContinue monitoring.     Hypokalemia: Resolved.   Acquired hypothyroidism -Continue Synthroid.   Dyslipidemia -Continue rosuvastatin.  Leukocytosis/bandemia: Likely from steroid. -Monitor  Hyperglycemia: Likely from steroid. -Check hemoglobin A1c  Tobacco use disorder: Reports smoking about a pack a day. -Encouraged cessation  Goal of care discussion: Prefers to remain full code but does not want to have life prolonged on a ventilator if unsuccessful.  Designate her husband as Ambulance person if she is not able to  Morbid obesity Body mass index is 37.06 kg/m.           DVT prophylaxis:  enoxaparin (LOVENOX) injection 40 mg Start: 01/28/22 2200  Code Status: Full code Family Communication: None at bedside. Level of care: Stepdown Status is: Inpatient Remains inpatient appropriate because: Acute on chronic respiratory failure with hypoxia and hypercapnia   Final disposition: TBD Consultants:   Oncology  Sch Meds:  Scheduled Meds:  amiodarone  100 mg Oral Daily   arformoterol  15 mcg Nebulization BID   And   umeclidinium bromide  1 puff Inhalation Daily   aspirin  81 mg Oral QHS   Chlorhexidine Gluconate Cloth  6 each Topical Daily   enoxaparin (LOVENOX) injection  40 mg Subcutaneous Q24H   furosemide  20 mg Oral Daily   levothyroxine  12.5 mcg Oral Once per day on Mon Tue Wed Thu Fri Sat   levothyroxine  50 mcg Oral Q0600   methylPREDNISolone (SOLU-MEDROL) injection  80 mg Intravenous Q12H   metoprolol succinate  100 mg Oral Daily   nicotine  21 mg Transdermal Daily   mouth rinse  15 mL Mouth Rinse 4 times per day   potassium chloride  10 mEq Oral QODAY   rosuvastatin  40 mg Oral QHS   sodium chloride flush  3 mL Intravenous Q12H   Continuous Infusions:  azithromycin (ZITHROMAX) 500 mg in sodium chloride 0.9 % 250 mL IVPB Stopped (01/29/22 2217)   cefTRIAXone (ROCEPHIN)  IV     PRN Meds:.acetaminophen **OR** acetaminophen, albuterol, ondansetron **OR** ondansetron (ZOFRAN) IV, mouth rinse, senna-docusate  Antimicrobials: Anti-infectives (From admission, onward)    Start     Dose/Rate Route Frequency Ordered Stop   01/30/22 2000  cefTRIAXone (  ROCEPHIN) 2 g in sodium chloride 0.9 % 100 mL IVPB        2 g 200 mL/hr over 30 Minutes Intravenous Every 24 hours 01/30/22 0719     01/29/22 2000  azithromycin (ZITHROMAX) 500 mg in sodium chloride 0.9 % 250 mL IVPB        500 mg 250 mL/hr over 60 Minutes Intravenous Every 24 hours 01/28/22 2140     01/29/22 2000  cefTRIAXone (ROCEPHIN) 1 g in sodium chloride 0.9 % 100 mL IVPB  Status:  Discontinued        1 g 200 mL/hr over 30 Minutes Intravenous Every 24 hours 01/28/22 2140 01/30/22 0719   01/28/22 2030  cefTRIAXone (ROCEPHIN) 1 g in sodium chloride 0.9 % 100 mL IVPB        1 g 200 mL/hr over 30 Minutes Intravenous  Once 01/28/22 2016 01/28/22 2105   01/28/22 2030  azithromycin (ZITHROMAX) 500 mg in sodium chloride 0.9  % 250 mL IVPB        500 mg 250 mL/hr over 60 Minutes Intravenous  Once 01/28/22 2016 01/28/22 2124        I have personally reviewed the following labs and images: CBC: Recent Labs  Lab 01/28/22 1554 01/28/22 1624 01/29/22 0407 01/30/22 0408  WBC 10.9*  --  9.2 19.6*  NEUTROABS 8.1*  --   --   --   HGB 11.3* 12.6 10.8* 10.9*  HCT 37.7 37.0 36.6 36.7  MCV 91.3  --  91.5 90.4  PLT 303  --  295 365   BMP &GFR Recent Labs  Lab 01/28/22 1554 01/28/22 1624 01/29/22 0407 01/30/22 0408  NA 140 140 140 142  K 3.3* 3.4* 4.8 4.8  CL 97* 95* 100 104  CO2 32  --  32 31  GLUCOSE 128* 126* 141* 148*  BUN 18 16 15 19   CREATININE 1.07* 1.10* 1.04* 1.07*  CALCIUM 9.1  --  8.9 9.3  MG  --   --   --  2.4  PHOS  --   --   --  4.0   Estimated Creatinine Clearance: 49.8 mL/min (A) (by C-G formula based on SCr of 1.07 mg/dL (H)). Liver & Pancreas: Recent Labs  Lab 01/28/22 1554 01/30/22 0408  AST 76*  --   ALT 95*  --   ALKPHOS 80  --   BILITOT 0.4  --   PROT 7.5  --   ALBUMIN 3.1* 2.6*   No results for input(s): "LIPASE", "AMYLASE" in the last 168 hours. No results for input(s): "AMMONIA" in the last 168 hours. Diabetic: No results for input(s): "HGBA1C" in the last 72 hours. No results for input(s): "GLUCAP" in the last 168 hours. Cardiac Enzymes: No results for input(s): "CKTOTAL", "CKMB", "CKMBINDEX", "TROPONINI" in the last 168 hours. No results for input(s): "PROBNP" in the last 8760 hours. Coagulation Profile: No results for input(s): "INR", "PROTIME" in the last 168 hours. Thyroid Function Tests: No results for input(s): "TSH", "T4TOTAL", "FREET4", "T3FREE", "THYROIDAB" in the last 72 hours. Lipid Profile: No results for input(s): "CHOL", "HDL", "LDLCALC", "TRIG", "CHOLHDL", "LDLDIRECT" in the last 72 hours. Anemia Panel: No results for input(s): "VITAMINB12", "FOLATE", "FERRITIN", "TIBC", "IRON", "RETICCTPCT" in the last 72 hours. Urine analysis:    Component  Value Date/Time   COLORURINE YELLOW 08/09/2021 0007   APPEARANCEUR HAZY (A) 08/09/2021 0007   LABSPEC 1.006 08/09/2021 0007   PHURINE 7.0 08/09/2021 0007   GLUCOSEU NEGATIVE 08/09/2021 0007   HGBUR  NEGATIVE 08/09/2021 0007   BILIRUBINUR NEGATIVE 08/09/2021 0007   BILIRUBINUR Negative 07/24/2020 1623   KETONESUR NEGATIVE 08/09/2021 0007   PROTEINUR 30 (A) 08/09/2021 0007   UROBILINOGEN 0.2 07/24/2020 1623   UROBILINOGEN 0.2 09/13/2010 0906   NITRITE NEGATIVE 08/09/2021 0007   LEUKOCYTESUR MODERATE (A) 08/09/2021 0007   Sepsis Labs: Invalid input(s): "PROCALCITONIN", "LACTICIDVEN"  Microbiology: Recent Results (from the past 240 hour(s))  Resp panel by RT-PCR (RSV, Flu A&B, Covid) Anterior Nasal Swab     Status: None   Collection Time: 01/28/22  3:44 PM   Specimen: Anterior Nasal Swab  Result Value Ref Range Status   SARS Coronavirus 2 by RT PCR NEGATIVE NEGATIVE Final    Comment: (NOTE) SARS-CoV-2 target nucleic acids are NOT DETECTED.  The SARS-CoV-2 RNA is generally detectable in upper respiratory specimens during the acute phase of infection. The lowest concentration of SARS-CoV-2 viral copies this assay can detect is 138 copies/mL. A negative result does not preclude SARS-Cov-2 infection and should not be used as the sole basis for treatment or other patient management decisions. A negative result may occur with  improper specimen collection/handling, submission of specimen other than nasopharyngeal swab, presence of viral mutation(s) within the areas targeted by this assay, and inadequate number of viral copies(<138 copies/mL). A negative result must be combined with clinical observations, patient history, and epidemiological information. The expected result is Negative.  Fact Sheet for Patients:  EntrepreneurPulse.com.au  Fact Sheet for Healthcare Providers:  IncredibleEmployment.be  This test is no t yet approved or cleared by  the [place] FDA and  has been authorized for detection and/or diagnosis of SARS-CoV-2 by FDA under an Emergency Use Authorization (EUA). This EUA will remain  in effect (meaning this test can be used) for the duration of the COVID-19 declaration under Section 564(b)(1) of the Act, 21 U.S.C.section 360bbb-3(b)(1), unless the authorization is terminated  or revoked sooner.       Influenza A by PCR NEGATIVE NEGATIVE Final   Influenza B by PCR NEGATIVE NEGATIVE Final    Comment: (NOTE) The Xpert Xpress SARS-CoV-2/FLU/RSV plus assay is intended as an aid in the diagnosis of influenza from Nasopharyngeal swab specimens and should not be used as a sole basis for treatment. Nasal washings and aspirates are unacceptable for Xpert Xpress SARS-CoV-2/FLU/RSV testing.  Fact Sheet for Patients: EntrepreneurPulse.com.au  Fact Sheet for Healthcare Providers: IncredibleEmployment.be  This test is not yet approved or cleared by the [place] FDA and has been authorized for detection and/or diagnosis of SARS-CoV-2 by FDA under an Emergency Use Authorization (EUA). This EUA will remain in effect (meaning this test can be used) for the duration of the COVID-19 declaration under Section 564(b)(1) of the Act, 21 U.S.C. section 360bbb-3(b)(1), unless the authorization is terminated or revoked.     Resp Syncytial Virus by PCR NEGATIVE NEGATIVE Final    Comment: (NOTE) Fact Sheet for Patients: EntrepreneurPulse.com.au  Fact Sheet for Healthcare Providers: IncredibleEmployment.be  This test is not yet approved or cleared by the [place] FDA and has been authorized for detection and/or diagnosis of SARS-CoV-2 by FDA under an Emergency Use Authorization (EUA). This EUA will remain in effect (meaning this test can be used) for the duration of the COVID-19 declaration under Section 564(b)(1) of the Act, 21  U.S.C. section 360bbb-3(b)(1), unless the authorization is terminated or revoked.  Performed at [facility], [place] [address] [place], [place] [address]   MRSA Next Gen by PCR, Nasal  Status: None   Collection Time: 01/28/22 10:28 PM  Result Value Ref Range Status   MRSA by PCR Next Gen NOT DETECTED NOT DETECTED Final    Comment: (NOTE) The GeneXpert MRSA Assay (FDA approved for NASAL specimens only), is one component of a comprehensive MRSA colonization surveillance program. It is not intended to diagnose MRSA infection nor to guide or monitor treatment for MRSA infections. Test performance is not FDA approved in patients less than 75 years old. Performed at [facility], [place] [address], [place], [place] [address]     Radiology Studies: No results found.    Alysen Smylie T. [place]  If 7PM-7AM, please contact night-coverage [email or web address] 01/30/2022, 10:45 AM

## 2022-01-30 NOTE — TOC Initial Note (Addendum)
Transition of Care [facility]) - Initial/Assessment Note    Patient Details  Name: [name] MRN: [id number] Date of Birth: December 28, 1945  Transition of Care [facility]) CM/SW Contact:    Henrietta Dine, RN Phone [phone] 01/30/2022, 4:43 PM  Clinical Narrative:                 Pt from home w/ spouse; spoke w/ pt and husband, Richard in room; she says she plans to return at d/c; the pt says she does not have glasses, hearing aids or dentures; she also says she has home oxygen thru [employer]; the pt says she has a travel tank she is not sure if it is full; her husband will check when he gets home; the pt says she does not have [place] services or other DME; she denies food insecurity, and IPV; she also says she has transportation; notified Kristin at [employer] of pt status; TOC will con't to follow.  Expected Discharge Plan: Home/Self Care Barriers to Discharge: Continued Medical Work up   Patient Goals and CMS Choice Patient states their goals for this hospitalization and ongoing recovery are:: home      Expected Discharge Plan and Services Expected Discharge Plan: Home/Self Care   Discharge Planning Services: CM Consult   Living arrangements for the past 2 months: Single Family Home                                      Prior Living Arrangements/Services Living arrangements for the past 2 months: Single Family Home Lives with:: Spouse Patient language and need for interpreter reviewed:: Yes Do you feel safe going back to the place where you live?: Yes      Need for Family Participation in Patient Care: Yes (Comment) Care giver support system in place?: Yes (comment) Current home services: DME (Home oxygen w/ [employer]) Criminal Activity/Legal Involvement Pertinent to Current Situation/Hospitalization: No - Comment as needed  Activities of Daily Living Home Assistive Devices/Equipment: None ADL Screening (condition at time of admission) Patient's cognitive ability  adequate to safely complete daily activities?: Yes Is the patient deaf or have difficulty hearing?: No Does the patient have difficulty seeing, even when wearing glasses/contacts?: No Does the patient have difficulty concentrating, remembering, or making decisions?: No Patient able to express need for assistance with ADLs?: Yes Does the patient have difficulty dressing or bathing?: No Independently performs ADLs?: Yes (appropriate for developmental age) Does the patient have difficulty walking or climbing stairs?: No Weakness of Legs: None Weakness of Arms/Hands: None  Permission Sought/Granted Permission sought to share information with : Case Manager Permission granted to share information with : Yes, Verbal Permission Granted  Share Information with NAME: Lenor Coffin, RN, CM           Emotional Assessment Appearance:: Appears stated age Attitude/Demeanor/Rapport: Gracious Affect (typically observed): Accepting Orientation: : Oriented to Self, Oriented to Place, Oriented to  Time, Oriented to Situation Alcohol / Substance Use: Not Applicable Psych Involvement: No (comment)  Admission diagnosis:  Acute on chronic respiratory failure with hypoxia (HCC) [J96.21] Acute on chronic respiratory failure with hypoxia and hypercapnia (HCC) [E33.29, J96.22] Patient Active Problem List   Diagnosis Date Noted   Hx of CABG 01/29/2022   History of CAD (coronary artery disease) 01/29/2022   AKI (acute kidney injury) ([place]) 01/29/2022   Hypoxia 01/28/2022   Acute on chronic respiratory failure with hypoxia and hypercapnia (HCC)  01/28/2022   Encounter for antineoplastic immunotherapy 11/15/2021   Paroxysmal atrial fibrillation ([place]) 09/01/2021   Hypokalemia 09/01/2021   Port-A-Cath in place 08/20/2021   Encounter for antineoplastic chemotherapy 08/20/2021   Collapse of left lung 08/09/2021   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 ([place]) 08/06/2021   Malignant pleural effusion  07/30/2021   Vasomotor rhinitis 03/04/2021   Body mass index (BMI) of 36.0-36.9 in adult 03/04/2021   Lower extremity edema 10/25/2020   Mixed hyperlipidemia 10/25/2020   Impaired fasting glucose 10/25/2020   Vitamin D deficiency 10/25/2020   Leukocytosis 10/25/2020   Cigarette smoker 09/11/2020   Chronic respiratory failure with hypoxia and hypercapnia ([place]) 07/02/2020   COPD GOLD III 07/02/2020   DOE (dyspnea on exertion) 06/29/2020   Hospital discharge follow-up 05/31/2020   Acute respiratory failure with hypoxia and hypercapnia (HCC) 05/31/2020   Supplemental oxygen dependent 05/31/2020   History of lung cancer 05/31/2020   Acute on chronic congestive heart failure ([place]) 05/31/2020   Paroxysmal atrial fibrillation with RVR ([place]) 05/31/2020   Iron deficiency anemia 05/31/2020   Dehydration 05/31/2020   Acute blood loss anemia 64/68/0321   Acute systolic congestive heart failure ([place]) 22/48/2500   Metabolic encephalopathy 37/05/8887   Influenza A 05/05/2020   Influenza vaccination declined 01/27/2019   Goals of care, counseling/discussion 01/27/2019   Morbidly obese ([place]) 05/15/2017   Glucose intolerance (impaired glucose tolerance) 05/15/2017   Myocardial infarct ([place]) 01/16/2017   Tobacco abuse counseling 01/16/2017   Acquired hypothyroidism 01/16/2017   Family history of leukemia 01/16/2017   Palpitations 07/21/2012   Lung cancer, upper lobe ([place]) 07/09/2011   HTN (hypertension)    CAD (coronary artery disease)    Dyslipidemia    GERD (gastroesophageal reflux disease)    Non-small cell lung cancer ([place]) 08/19/2010   PCP:  [name], NP Pharmacy:   [id number] [address] [address] Phone: [phone] Fax: [phone]  [id number] [place], [address] [place] [address] Phone: [phone] Fax: [phone]     Social Determinants of Health (SDOH)  Interventions Housing Interventions: Intervention Not Indicated  Readmission Risk Interventions    01/30/2022    4:41 PM  Readmission Risk Prevention Plan  Transportation Screening Complete  PCP or Specialist Appt within 3-5 Days Complete  HRI or Home Care Consult Complete  Palliative Care Screening Not Applicable  Medication Review (RN Care Manager) Complete

## 2022-01-31 DIAGNOSIS — I480 Paroxysmal atrial fibrillation: Secondary | ICD-10-CM | POA: Diagnosis not present

## 2022-01-31 DIAGNOSIS — J9621 Acute and chronic respiratory failure with hypoxia: Secondary | ICD-10-CM | POA: Diagnosis not present

## 2022-01-31 DIAGNOSIS — E039 Hypothyroidism, unspecified: Secondary | ICD-10-CM | POA: Diagnosis not present

## 2022-01-31 DIAGNOSIS — C3492 Malignant neoplasm of unspecified part of left bronchus or lung: Secondary | ICD-10-CM | POA: Diagnosis not present

## 2022-01-31 LAB — CBC
HCT: 37 % (ref 36.0–46.0)
Hemoglobin: 11.2 g/dL — ABNORMAL LOW (ref 12.0–15.0)
MCH: 27.7 pg (ref 26.0–34.0)
MCHC: 30.3 g/dL (ref 30.0–36.0)
MCV: 91.4 fL (ref 80.0–100.0)
Platelets: 418 10*3/uL — ABNORMAL HIGH (ref 150–400)
RBC: 4.05 MIL/uL (ref 3.87–5.11)
RDW: 14.9 % (ref 11.5–15.5)
WBC: 22.9 10*3/uL — ABNORMAL HIGH (ref 4.0–10.5)
nRBC: 0 % (ref 0.0–0.2)

## 2022-01-31 LAB — RENAL FUNCTION PANEL
Albumin: 2.6 g/dL — ABNORMAL LOW (ref 3.5–5.0)
Anion gap: 9 (ref 5–15)
BUN: 25 mg/dL — ABNORMAL HIGH (ref 8–23)
CO2: 30 mmol/L (ref 22–32)
Calcium: 9 mg/dL (ref 8.9–10.3)
Chloride: 105 mmol/L (ref 98–111)
Creatinine, Ser: 1.1 mg/dL — ABNORMAL HIGH (ref 0.44–1.00)
GFR, Estimated: 52 mL/min — ABNORMAL LOW (ref >60)
Glucose, Bld: 121 mg/dL — ABNORMAL HIGH (ref 70–99)
Phosphorus: 4.3 mg/dL (ref 2.5–4.6)
Potassium: 3.9 mmol/L (ref 3.5–5.1)
Sodium: 144 mmol/L (ref 135–145)

## 2022-01-31 LAB — MAGNESIUM: Magnesium: 2.6 mg/dL — ABNORMAL HIGH (ref 1.7–2.4)

## 2022-01-31 MED ORDER — GUAIFENESIN-DM 100-10 MG/5ML PO SYRP
10.0000 mL | ORAL_SOLUTION | ORAL | Status: DC | PRN
  Administered 2022-01-31 – 2022-02-03 (×3): 10 mL via ORAL
  Filled 2022-01-31 (×3): qty 10

## 2022-01-31 MED ORDER — LIP MEDEX EX OINT
TOPICAL_OINTMENT | CUTANEOUS | Status: DC | PRN
  Filled 2022-01-31: qty 7

## 2022-01-31 NOTE — Progress Notes (Signed)
PROGRESS NOTE  [name] KGS:[id number] DOB: [birth year]   PCP: [name], NP  Patient is from: Home.  DOA: 01/28/2022 LOS: 3  Chief complaints Low oxygen, lightheadedness    Brief Narrative / Interim history: 76 year old F with PMH of COPD/chronic hypoxic RF on 2 to 3 L, NSCLC of left lung s/p chemoradiation currently on immunotherapy, paroxysmal A-fib not on AC due to amyloid angiopathy of brain, hypothyroidism, hyperlipidemia and tobacco use disorder presented to PCP with lightheadedness, wooziness and increased oxygen requirement for about 5 days, and sent to ED due to acute on chronic respiratory failure with hypoxia with oxygen saturation in the range of 70 to 78% on 5 L by nasal cannula.  In ED, she initially required NRB but weaned down to 3 L by HFNC later.  ABG with mild hypercarbia.  BNP and serial troponin negative.  CTA chest negative for PE but new GGO opacities with interlobular septal thickening throughout the right lung, persistent irregular appearing small left pleural effusion, progressive interstitial alveolar opacity throughout the LLL and slightly increased multiple mildly prominent mediastinal and right healer lymph nodes.  Patient was started on IV ceftriaxone and admitted.  Oncology following.  Requiring 12 L by HFNC.  Subjective: Seen and examined earlier this morning.  No major events overnight of this morning.  Had some productive cough early in the morning.  Denies shortness of breath other than intermittent desaturation with minimal activities.  Saturating in upper 90s on 12 L by [place].  Requested RN to wean oxygen as able.   Objective: Vitals:   01/30/22 2300 01/31/22 0400 01/31/22 0800 01/31/22 1200  BP: 119/65     Pulse: 80     Resp: (!) 30     Temp:  98.1 F (36.7 C) 97.6 F (36.4 C) 98.2 F (36.8 C)  TempSrc:  Oral Oral Oral  SpO2: 97%     Weight:      Height:        Examination:  GENERAL: No apparent distress.  Nontoxic. HEENT:  MMM.  Vision and hearing grossly intact.  NECK: Supple.  No apparent JVD.  RESP:  No IWOB.  Fair aeration bilaterally.  Crackles. CVS:  RRR. Heart sounds normal.  ABD/GI/GU: BS+. Abd soft, NTND.  MSK/EXT:  Moves extremities. No apparent deformity. No edema.  SKIN: no apparent skin lesion or wound NEURO: Awake and alert. Oriented appropriately.  No apparent focal neuro deficit. PSYCH: Calm. Normal affect.   Procedures:  None  Microbiology summarized: [id number], influenza and RSV PCR nonreactive.  Assessment and plan: Principal Problem:   Acute on chronic respiratory failure with hypoxia and hypercapnia (HCC) Active Problems:   COPD GOLD III   Non-small cell lung cancer (HCC)   Paroxysmal atrial fibrillation (HCC)   Dyslipidemia   Acquired hypothyroidism   Morbidly obese (HCC)   Hypokalemia   Hx of CABG   History of CAD (coronary artery disease)   AKI (acute kidney injury) ([place])  Acute on chronic respiratory failure with hypoxia and hypercapnia: Requiring 12 L.  On 2 to 3 L at baseline.  Surprisingly, she does not feel short of breath.cough is unchanged from baseline.  COVID-19, influenza and RSV PCR nonreactive.  Serial troponin and BNP are within normal.  Unfortunately, she continues to smoke about a pack a day.  CTA chest negative for PE but new GGO opacities with interlobular septal thickening throughout the right lung, persistent irregular appearing small left pleural effusion, progressive interstitial alveolar opacity throughout the  LLL and slightly increased multiple mildly prominent mediastinal and right healer lymph nodes.  She is at risk for pneumonia and/or pneumonitis with immunotherapy.  -Holding immunotherapy. -Continue ceftriaxone, Solu-Medrol and Azithromycin. -Advised RN to wean oxygen as able -Continue home Lasix 20 mg daily -Incentive spirometry, OOB, PT/OT -Encouraged smoking cessation.  Nicotine patch ordered.   COPD GOLD III: No wheezing or significant  rhonchi on exam.  Really no respiratory distress other than increased oxygen requirement.  -Continue Bevespi and albuterol as needed -Steroid and antibiotics as above. -Wean supplemental O2 as able   Non-small cell lung cancer:follows with oncology, Dr. Curt Bears.  History of remote LUL lobectomy.  S/p recent chemoradiation now on active immunotherapy with Durvalumab. -Oncology following.  Appreciate input. -Hold immunotherapy   Paroxysmal atrial fibrillation: In sinus rhythm.  Not on anticoagulation due to concern for high bleeding risk in the setting of cerebral amyloid angiopathy. -Continue Toprol-XL, amiodarone, and aspirin  History of CAD/CABG/stent: Stable. -Continue home medication  AKI: Creatinine is stable. Recent Labs    09/17/21 1047 10/18/21 1209 11/15/21 0812 12/13/21 0932 01/14/22 0925 01/28/22 1554 01/28/22 1624 01/29/22 0407 01/30/22 0408 01/31/22 0515  BUN 11 10 12 11 15 18 16 15 19  25*  CREATININE 0.84 0.71 0.75 0.89 0.88 1.07* 1.10* 1.04* 1.07* 1.10*  -Continue monitoringContinue monitoring.   Hypokalemia: Resolved.   Acquired hypothyroidism -Continue Synthroid.   Dyslipidemia -Continue rosuvastatin.  Leukocytosis/bandemia: Likely from steroid. -Monitor  Hyperglycemia: Likely from steroid. -Check hemoglobin A1c  Tobacco use disorder: Reports smoking about a pack a day. -Encouraged cessation  Physical deconditioning: -PT/OT  Goal of care discussion: Prefers to remain full code but does not want to have life prolonged on a ventilator if unsuccessful.  Designate her husband as Ambulance person if she is not able to  Morbid obesity Body mass index is 37.06 kg/m.           DVT prophylaxis:  enoxaparin (LOVENOX) injection 40 mg Start: 01/28/22 2200  Code Status: Full code Family Communication: None at bedside. Level of care: Stepdown Status is: Inpatient Remains inpatient appropriate because: Acute on chronic respiratory  failure with hypoxia and hypercapnia   Final disposition: TBD Consultants:  Oncology  Sch Meds:  Scheduled Meds:  amiodarone  100 mg Oral Daily   arformoterol  15 mcg Nebulization BID   And   umeclidinium bromide  1 puff Inhalation Daily   aspirin  81 mg Oral QHS   Chlorhexidine Gluconate Cloth  6 each Topical Daily   enoxaparin (LOVENOX) injection  40 mg Subcutaneous Q24H   furosemide  20 mg Oral Daily   levothyroxine  12.5 mcg Oral Once per day on Mon Tue Wed Thu Fri Sat   levothyroxine  50 mcg Oral Q0600   methylPREDNISolone (SOLU-MEDROL) injection  80 mg Intravenous Q12H   metoprolol succinate  100 mg Oral Daily   nicotine  21 mg Transdermal Daily   mouth rinse  15 mL Mouth Rinse 4 times per day   potassium chloride  10 mEq Oral QODAY   rosuvastatin  40 mg Oral QHS   sodium chloride flush  3 mL Intravenous Q12H   Continuous Infusions:  azithromycin (ZITHROMAX) 500 mg in sodium chloride 0.9 % 250 mL IVPB Stopped (01/30/22 2316)   cefTRIAXone (ROCEPHIN)  IV Stopped (01/30/22 2120)   PRN Meds:.acetaminophen **OR** acetaminophen, albuterol, ondansetron **OR** ondansetron (ZOFRAN) IV, mouth rinse, senna-docusate  Antimicrobials: Anti-infectives (From admission, onward)    Start     Dose/Rate Route Frequency  Ordered Stop   01/30/22 2000  cefTRIAXone (ROCEPHIN) 2 g in sodium chloride 0.9 % 100 mL IVPB        2 g 200 mL/hr over 30 Minutes Intravenous Every 24 hours 01/30/22 0719     01/29/22 2000  azithromycin (ZITHROMAX) 500 mg in sodium chloride 0.9 % 250 mL IVPB        500 mg 250 mL/hr over 60 Minutes Intravenous Every 24 hours 01/28/22 2140     01/29/22 2000  cefTRIAXone (ROCEPHIN) 1 g in sodium chloride 0.9 % 100 mL IVPB  Status:  Discontinued        1 g 200 mL/hr over 30 Minutes Intravenous Every 24 hours 01/28/22 2140 01/30/22 0719   01/28/22 2030  cefTRIAXone (ROCEPHIN) 1 g in sodium chloride 0.9 % 100 mL IVPB        1 g 200 mL/hr over 30 Minutes Intravenous  Once  01/28/22 2016 01/28/22 2105   01/28/22 2030  azithromycin (ZITHROMAX) 500 mg in sodium chloride 0.9 % 250 mL IVPB        500 mg 250 mL/hr over 60 Minutes Intravenous  Once 01/28/22 2016 01/28/22 2124        I have personally reviewed the following labs and images: CBC: Recent Labs  Lab 01/28/22 1554 01/28/22 1624 01/29/22 0407 01/30/22 0408 01/31/22 0515  WBC 10.9*  --  9.2 19.6* 22.9*  NEUTROABS 8.1*  --   --   --   --   HGB 11.3* 12.6 10.8* 10.9* 11.2*  HCT 37.7 37.0 36.6 36.7 37.0  MCV 91.3  --  91.5 90.4 91.4  PLT 303  --  295 365 418*   BMP &GFR Recent Labs  Lab 01/28/22 1554 01/28/22 1624 01/29/22 0407 01/30/22 0408 01/31/22 0515  NA 140 140 140 142 144  K 3.3* 3.4* 4.8 4.8 3.9  CL 97* 95* 100 104 105  CO2 32  --  32 31 30  GLUCOSE 128* 126* 141* 148* 121*  BUN 18 16 15 19  25*  CREATININE 1.07* 1.10* 1.04* 1.07* 1.10*  CALCIUM 9.1  --  8.9 9.3 9.0  MG  --   --   --  2.4 2.6*  PHOS  --   --   --  4.0 4.3   Estimated Creatinine Clearance: 48.4 mL/min (A) (by C-G formula based on SCr of 1.1 mg/dL (H)). Liver & Pancreas: Recent Labs  Lab 01/28/22 1554 01/30/22 0408 01/31/22 0515  AST 76*  --   --   ALT 95*  --   --   ALKPHOS 80  --   --   BILITOT 0.4  --   --   PROT 7.5  --   --   ALBUMIN 3.1* 2.6* 2.6*   No results for input(s): "LIPASE", "AMYLASE" in the last 168 hours. No results for input(s): "AMMONIA" in the last 168 hours. Diabetic: No results for input(s): "HGBA1C" in the last 72 hours. No results for input(s): "GLUCAP" in the last 168 hours. Cardiac Enzymes: No results for input(s): "CKTOTAL", "CKMB", "CKMBINDEX", "TROPONINI" in the last 168 hours. No results for input(s): "PROBNP" in the last 8760 hours. Coagulation Profile: No results for input(s): "INR", "PROTIME" in the last 168 hours. Thyroid Function Tests: No results for input(s): "TSH", "T4TOTAL", "FREET4", "T3FREE", "THYROIDAB" in the last 72 hours. Lipid Profile: No results  for input(s): "CHOL", "HDL", "LDLCALC", "TRIG", "CHOLHDL", "LDLDIRECT" in the last 72 hours. Anemia Panel: No results for input(s): "VITAMINB12", "FOLATE", "FERRITIN", "TIBC", "IRON", "  RETICCTPCT" in the last 72 hours. Urine analysis:    Component Value Date/Time   COLORURINE YELLOW 08/09/2021 0007   APPEARANCEUR HAZY (A) 08/09/2021 0007   LABSPEC 1.006 08/09/2021 0007   PHURINE 7.0 08/09/2021 0007   GLUCOSEU NEGATIVE 08/09/2021 0007   HGBUR NEGATIVE 08/09/2021 0007   BILIRUBINUR NEGATIVE 08/09/2021 0007   BILIRUBINUR Negative 07/24/2020 1623   KETONESUR NEGATIVE 08/09/2021 0007   PROTEINUR 30 (A) 08/09/2021 0007   UROBILINOGEN 0.2 07/24/2020 1623   UROBILINOGEN 0.2 09/13/2010 0906   NITRITE NEGATIVE 08/09/2021 0007   LEUKOCYTESUR MODERATE (A) 08/09/2021 0007   Sepsis Labs: Invalid input(s): "PROCALCITONIN", "LACTICIDVEN"  Microbiology: Recent Results (from the past 240 hour(s))  Resp panel by RT-PCR (RSV, Flu A&B, Covid) Anterior Nasal Swab     Status: None   Collection Time: 01/28/22  3:44 PM   Specimen: Anterior Nasal Swab  Result Value Ref Range Status   SARS Coronavirus 2 by RT PCR NEGATIVE NEGATIVE Final    Comment: (NOTE) SARS-CoV-2 target nucleic acids are NOT DETECTED.  The SARS-CoV-2 RNA is generally detectable in upper respiratory specimens during the acute phase of infection. The lowest concentration of SARS-CoV-2 viral copies this assay can detect is 138 copies/mL. A negative result does not preclude SARS-Cov-2 infection and should not be used as the sole basis for treatment or other patient management decisions. A negative result may occur with  improper specimen collection/handling, submission of specimen other than nasopharyngeal swab, presence of viral mutation(s) within the areas targeted by this assay, and inadequate number of viral copies(<138 copies/mL). A negative result must be combined with clinical observations, patient history, and  epidemiological information. The expected result is Negative.  Fact Sheet for Patients:  EntrepreneurPulse.com.au  Fact Sheet for Healthcare Providers:  IncredibleEmployment.be  This test is no t yet approved or cleared by the [place] FDA and  has been authorized for detection and/or diagnosis of SARS-CoV-2 by FDA under an Emergency Use Authorization (EUA). This EUA will remain  in effect (meaning this test can be used) for the duration of the COVID-19 declaration under Section 564(b)(1) of the Act, 21 U.S.C.section 360bbb-3(b)(1), unless the authorization is terminated  or revoked sooner.       Influenza A by PCR NEGATIVE NEGATIVE Final   Influenza B by PCR NEGATIVE NEGATIVE Final    Comment: (NOTE) The Xpert Xpress SARS-CoV-2/FLU/RSV plus assay is intended as an aid in the diagnosis of influenza from Nasopharyngeal swab specimens and should not be used as a sole basis for treatment. Nasal washings and aspirates are unacceptable for Xpert Xpress SARS-CoV-2/FLU/RSV testing.  Fact Sheet for Patients: EntrepreneurPulse.com.au  Fact Sheet for Healthcare Providers: IncredibleEmployment.be  This test is not yet approved or cleared by the [place] FDA and has been authorized for detection and/or diagnosis of SARS-CoV-2 by FDA under an Emergency Use Authorization (EUA). This EUA will remain in effect (meaning this test can be used) for the duration of the COVID-19 declaration under Section 564(b)(1) of the Act, 21 U.S.C. section 360bbb-3(b)(1), unless the authorization is terminated or revoked.     Resp Syncytial Virus by PCR NEGATIVE NEGATIVE Final    Comment: (NOTE) Fact Sheet for Patients: EntrepreneurPulse.com.au  Fact Sheet for Healthcare Providers: IncredibleEmployment.be  This test is not yet approved or cleared by the [place] FDA and has been  authorized for detection and/or diagnosis of SARS-CoV-2 by FDA under an Emergency Use Authorization (EUA). This EUA will remain in effect (meaning this test can be used)  for the duration of the COVID-19 declaration under Section 564(b)(1) of the Act, 21 U.S.C. section 360bbb-3(b)(1), unless the authorization is terminated or revoked.  Performed at [facility], [place] [address] [place], [place] [address]   MRSA Next Gen by PCR, Nasal     Status: None   Collection Time: 01/28/22 10:28 PM  Result Value Ref Range Status   MRSA by PCR Next Gen NOT DETECTED NOT DETECTED Final    Comment: (NOTE) The GeneXpert MRSA Assay (FDA approved for NASAL specimens only), is one component of a comprehensive MRSA colonization surveillance program. It is not intended to diagnose MRSA infection nor to guide or monitor treatment for MRSA infections. Test performance is not FDA approved in patients less than 68 years old. Performed at [facility], [place] [address], [place], [place] [address]     Radiology Studies: No results found.    Thessaly Mccullers T. [place]  If 7PM-7AM, please contact night-coverage [email or web address] 01/31/2022, 2:52 PM

## 2022-01-31 NOTE — Evaluation (Signed)
Physical Therapy Evaluation Patient Details Name: [name] MRN: [id number] DOB: [birth year] Today's Date: 01/31/2022  History of Present Illness  76 year old F with PMH of COPD/chronic hypoxic RF on 2 to 3 L, NSCLC of left lung s/p chemoradiation currently on immunotherapy, paroxysmal A-fib, amyloid angiopathy of brain, hypothyroidism, hyperlipidemia and tobacco use disorder presented to to ED 01/28/22 with lightheadedness, respiratory failure with hypoxia with oxygen saturation in the range of 70 to 78%, required NRB  Clinical Impression  Pt admitted with above diagnosis.  Pt currently with functional limitations due to the deficits listed below (see PT Problem List). Pt will benefit from skilled PT to increase their independence and safety with mobility to allow discharge to the venue listed below.     The patient is  received in recliner, Supervision to stand and step to [facility] then to bed, On 11 LPM HFNC.  Spo2 rest 90%, drop to 76% with activity. Returns to 90% with rest /relaxation, limited talk. Patient independent PTA and should progress as respiratory status improves . Patient reports that she uses 10 L O2 at  home.      Recommendations for follow up therapy are one component of a multi-disciplinary discharge planning process, led by the attending physician.  Recommendations may be updated based on patient status, additional functional criteria and insurance authorization.  Follow Up Recommendations No PT follow up      Assistance Recommended at Discharge Set up Supervision/Assistance  Patient can return home with the following  A little help with bathing/dressing/bathroom;Help with stairs or ramp for entrance;Assist for transportation;Assistance with cooking/housework    Equipment Recommendations None recommended by PT  Recommendations for Other Services       Functional Status Assessment Patient has had a recent decline in their functional status and demonstrates the  ability to make significant improvements in function in a reasonable and predictable amount of time.     Precautions / Restrictions Precautions Precaution Comments: on 11 L HFNC, states uses 10 LPM at home??      Mobility  Bed Mobility Overal bed mobility: Modified Independent                  Transfers Overall transfer level: Needs assistance Equipment used: None Transfers: Sit to/from Stand, Bed to chair/wheelchair/BSC Sit to Stand: Supervision   Step pivot transfers: Supervision       General transfer comment: from recliner  to [facility] to bed    Ambulation/Gait               General Gait Details: NT, decreased SPO2  Stairs            Wheelchair Mobility    Modified Rankin (Stroke Patients Only)       Balance Overall balance assessment: Mild deficits observed, not formally tested                                           Pertinent Vitals/Pain Pain Assessment Pain Assessment: No/denies pain    Home Living Family/patient expects to be discharged to:: Private residence Living Arrangements: Spouse/significant other Available Help at Discharge: Family;Available 24 hours/day Type of Home: House Home Access: Stairs to enter   [employer] of Steps: 1   Home Layout: One level Home Equipment: Shower seat      Prior Function Prior Level of Function : Independent/Modified Independent  Hand Dominance        Extremity/Trunk Assessment   Upper Extremity Assessment Upper Extremity Assessment: Overall WFL for tasks assessed    Lower Extremity Assessment Lower Extremity Assessment: Overall WFL for tasks assessed    Cervical / Trunk Assessment Cervical / Trunk Assessment: Normal  Communication   Communication: No difficulties  Cognition Arousal/Alertness: Awake/alert Behavior During Therapy: WFL for tasks assessed/performed Overall Cognitive Status: Within Functional Limits for  tasks assessed                                          General Comments      Exercises     Assessment/Plan    PT Assessment Patient needs continued PT services  PT Problem List Decreased mobility;Cardiopulmonary status limiting activity;Decreased activity tolerance       PT Treatment Interventions DME instruction;Therapeutic activities;Gait training;Therapeutic exercise;Patient/family education;Functional mobility training    PT Goals (Current goals can be found in the Care Plan section)  Acute Rehab PT Goals Patient Stated Goal: to go home PT Goal Formulation: With patient Time For Goal Achievement: 02/14/22 Potential to Achieve Goals: Good    Frequency Min 3X/week     Co-evaluation               AM-PAC PT "6 Clicks" Mobility  Outcome Measure Help needed turning from your back to your side while in a flat bed without using bedrails?: None Help needed moving from lying on your back to sitting on the side of a flat bed without using bedrails?: None Help needed moving to and from a bed to a chair (including a wheelchair)?: None Help needed standing up from a chair using your arms (e.g., wheelchair or bedside chair)?: A Little Help needed to walk in hospital room?: Total Help needed climbing 3-5 steps with a railing? : Total 6 Click Score: 17    End of Session Equipment Utilized During Treatment: Oxygen Activity Tolerance: Treatment limited secondary to medical complications (Comment) (SOB, desat) Patient left: in bed;with bed alarm set;with call bell/phone within reach Nurse Communication: Mobility status PT Visit Diagnosis: Difficulty in walking, not elsewhere classified (R26.2)    Time: 4356-8616 PT Time Calculation (min) (ACUTE ONLY): 23 min   Charges:   PT Evaluation $PT Eval Low Complexity: 1 Low PT Treatments $Therapeutic Activity: 8-22 mins        [place] Office [phone] Weekend  [phone]   Claretha Cooper 01/31/2022, 3:56 PM

## 2022-02-01 DIAGNOSIS — J9621 Acute and chronic respiratory failure with hypoxia: Secondary | ICD-10-CM | POA: Diagnosis not present

## 2022-02-01 DIAGNOSIS — E039 Hypothyroidism, unspecified: Secondary | ICD-10-CM | POA: Diagnosis not present

## 2022-02-01 DIAGNOSIS — J984 Other disorders of lung: Secondary | ICD-10-CM

## 2022-02-01 DIAGNOSIS — C3492 Malignant neoplasm of unspecified part of left bronchus or lung: Secondary | ICD-10-CM | POA: Diagnosis not present

## 2022-02-01 DIAGNOSIS — I480 Paroxysmal atrial fibrillation: Secondary | ICD-10-CM | POA: Diagnosis not present

## 2022-02-01 LAB — RESPIRATORY PANEL BY PCR

## 2022-02-01 LAB — CBC
HCT: 39.6 % (ref 36.0–46.0)
Hemoglobin: 11.8 g/dL — ABNORMAL LOW (ref 12.0–15.0)
MCH: 27.4 pg (ref 26.0–34.0)
MCHC: 29.8 g/dL — ABNORMAL LOW (ref 30.0–36.0)
MCV: 91.9 fL (ref 80.0–100.0)
Platelets: 432 10*3/uL — ABNORMAL HIGH (ref 150–400)
RBC: 4.31 MIL/uL (ref 3.87–5.11)
RDW: 15 % (ref 11.5–15.5)
WBC: 20.6 10*3/uL — ABNORMAL HIGH (ref 4.0–10.5)
nRBC: 0 % (ref 0.0–0.2)

## 2022-02-01 LAB — RENAL FUNCTION PANEL
Albumin: 2.9 g/dL — ABNORMAL LOW (ref 3.5–5.0)
Anion gap: 8 (ref 5–15)
BUN: 22 mg/dL (ref 8–23)
CO2: 31 mmol/L (ref 22–32)
Calcium: 8.6 mg/dL — ABNORMAL LOW (ref 8.9–10.3)
Chloride: 103 mmol/L (ref 98–111)
Creatinine, Ser: 0.83 mg/dL (ref 0.44–1.00)
GFR, Estimated: >60 mL/min (ref >60)
Glucose, Bld: 209 mg/dL — ABNORMAL HIGH (ref 70–99)
Phosphorus: 2.7 mg/dL (ref 2.5–4.6)
Potassium: 3.5 mmol/L (ref 3.5–5.1)
Sodium: 142 mmol/L (ref 135–145)

## 2022-02-01 LAB — MAGNESIUM: Magnesium: 2.6 mg/dL — ABNORMAL HIGH (ref 1.7–2.4)

## 2022-02-01 LAB — BRAIN NATRIURETIC PEPTIDE: B Natriuretic Peptide: 252.8 pg/mL — ABNORMAL HIGH (ref 0.0–100.0)

## 2022-02-01 MED ORDER — ARFORMOTEROL TARTRATE 15 MCG/2ML IN NEBU
15.0000 ug | INHALATION_SOLUTION | Freq: Two times a day (BID) | RESPIRATORY_TRACT | Status: DC
  Administered 2022-02-01 – 2022-02-08 (×11): 15 ug via RESPIRATORY_TRACT
  Filled 2022-02-01 (×17): qty 2

## 2022-02-01 MED ORDER — FUROSEMIDE 10 MG/ML IJ SOLN
40.0000 mg | Freq: Every day | INTRAMUSCULAR | Status: DC
  Administered 2022-02-01 – 2022-02-07 (×7): 40 mg via INTRAVENOUS
  Filled 2022-02-01 (×7): qty 4

## 2022-02-01 MED ORDER — IPRATROPIUM-ALBUTEROL 0.5-2.5 (3) MG/3ML IN SOLN
3.0000 mL | RESPIRATORY_TRACT | Status: DC | PRN
  Administered 2022-02-06 – 2022-02-07 (×2): 3 mL via RESPIRATORY_TRACT
  Filled 2022-02-01 (×3): qty 3

## 2022-02-01 MED ORDER — BUDESONIDE 0.5 MG/2ML IN SUSP
0.5000 mg | Freq: Two times a day (BID) | RESPIRATORY_TRACT | Status: DC
  Administered 2022-02-01 – 2022-02-08 (×14): 0.5 mg via RESPIRATORY_TRACT
  Filled 2022-02-01 (×15): qty 2

## 2022-02-01 MED ORDER — REVEFENACIN 175 MCG/3ML IN SOLN
175.0000 ug | Freq: Every day | RESPIRATORY_TRACT | Status: DC
  Administered 2022-02-02 – 2022-02-08 (×7): 175 ug via RESPIRATORY_TRACT
  Filled 2022-02-01 (×8): qty 3

## 2022-02-01 MED ORDER — POTASSIUM CHLORIDE CRYS ER 20 MEQ PO TBCR
40.0000 meq | EXTENDED_RELEASE_TABLET | Freq: Once | ORAL | Status: AC
  Administered 2022-02-01: 40 meq via ORAL
  Filled 2022-02-01: qty 2

## 2022-02-01 NOTE — Progress Notes (Signed)
PROGRESS NOTE  EARLE BURSON FMB:[id number] DOB: 18-Nov-1945   PCP: [name], NP  Patient is from: Home.  DOA: 01/28/2022 LOS: 4  Chief complaints Low oxygen, lightheadedness    Brief Narrative / Interim history: 76 year old F with PMH of COPD/chronic hypoxic RF on 3.5L recently increased to 5L, NSCLC of left lung s/p chemoradiation currently on immunotherapy, paroxysmal A-fib not on AC due to amyloid angiopathy of brain, hypothyroidism, hyperlipidemia and tobacco use disorder presented to PCP with lightheadedness, wooziness and increased oxygen requirement for about 5 days, and sent to ED due to acute on chronic respiratory failure with hypoxia with oxygen saturation in the range of 70 to 78% on 5 L by [place].   In ED, she initially required NRB but weaned down to 10 L by HFNC later.  ABG with mild hypercarbia.  BNP and serial troponin negative.  CTA chest negative for PE but new GGO opacities with interlobular septal thickening throughout the right lung, persistent irregular appearing small left pleural effusion, progressive interstitial alveolar opacity throughout the LLL and slightly increased multiple mildly prominent mediastinal and right healer lymph nodes.  Patient was started on IV ceftriaxone and Solu-Medrol for possible pneumonia/immunotherapy related pneumonitis.  Oncology consulted and recommended CAP coverage and higher dose of steroid.  Patient continues to require 10 L by HFNC but has no respiratory distress.  Pulmonology consulted.  Subjective: Seen and examined earlier this morning.  No major events overnight of this morning.  Denies shortness of breath but some productive cough last night.  Denies chest pain, GI or UTI symptoms.  Objective: Vitals:   02/01/22 0900 02/01/22 1000 02/01/22 1200 02/01/22 1241  BP: 139/77 134/81    Pulse: 65 65    Resp: 19 15    Temp:   98 F (36.7 C)   TempSrc:   Oral   SpO2: 90% 97%  (!) 87%  Weight:      Height:         Examination:  GENERAL: No apparent distress.  Nontoxic. HEENT: MMM.  Vision and hearing grossly intact.  NECK: Supple.  No apparent JVD.  RESP:  No IWOB.  Fair aeration bilaterally. CVS:  RRR. Heart sounds normal.  ABD/GI/GU: BS+. Abd soft, NTND.  MSK/EXT:  Moves extremities. No apparent deformity. No edema.  SKIN: no apparent skin lesion or wound NEURO: Awake and alert. Oriented appropriately.  No apparent focal neuro deficit. PSYCH: Calm. Normal affect.   Procedures:  None  Microbiology summarized: [id number], influenza and RSV PCR nonreactive.  Assessment and plan: Principal Problem:   Acute on chronic respiratory failure with hypoxia and hypercapnia (HCC) Active Problems:   COPD GOLD III   Non-small cell lung cancer (HCC)   Paroxysmal atrial fibrillation (HCC)   Dyslipidemia   Acquired hypothyroidism   Morbidly obese (HCC)   Hypokalemia   Hx of CABG   History of CAD (coronary artery disease)   AKI (acute kidney injury) ([place])  Acute on chronic respiratory failure with hypoxia and hypercapnia:  On 3.5 L at baseline but lately required up to 5 L at home.  She has no respiratory distress but requiring 10 L to maintain saturation in upper 80s.  COVID-19, influenza and RSV PCR nonreactive.  Serial troponin and BNP are within normal.  Unfortunately, she continues to smoke about a pack a day.  CTA chest negative for PE but new GGO opacities with interlobular septal thickening throughout the right lung, persistent irregular appearing small left pleural effusion, progressive interstitial  alveolar opacity throughout the LLL and slightly increased multiple mildly prominent mediastinal and right healer lymph nodes.  She is at risk for pneumonia and/or pneumonitis with immunotherapy.  -Continue holding immunotherapy until outpatient follow-up with oncology. -Continue ceftriaxone, Solu-Medrol and Azithromycin. -Continue Brovana.  Change Incruse Ellipta to [employer].  Add budesonide.   DuoNebs as needed. -Wean oxygen as able.  Minimum oxygen to keep saturation above 87%. -Continue home Lasix 20 mg daily -Incentive spirometry, OOB, PT/OT -Encouraged smoking cessation.  Nicotine patch ordered. -Consulted pulmonology.  COPD GOLD III:  no respiratory distress other than increased oxygen requirement.  -Management as above.   Non-small cell lung cancer:follows with oncology, Dr. Curt Bears.  History of remote LUL lobectomy.  S/p recent chemoradiation now on active immunotherapy with Durvalumab. -Appreciate input by oncology-antibiotics and steroid as above, and hold immunotherapy   Paroxysmal atrial fibrillation: In sinus rhythm.  Not on anticoagulation due to concern for high bleeding risk in the setting of cerebral amyloid angiopathy. -Continue Toprol-XL, amiodarone, and aspirin  History of CAD/CABG/stent: Stable. -Continue home medication  Elevated creatinine: AKI ruled out.  Stable. Recent Labs    09/17/21 1047 10/18/21 1209 11/15/21 0812 12/13/21 0932 01/14/22 0925 01/28/22 1554 01/28/22 1624 01/29/22 0407 01/30/22 0408 01/31/22 0515  BUN 11 10 12 11 15 18 16 15 19  25*  CREATININE 0.84 0.71 0.75 0.89 0.88 1.07* 1.10* 1.04* 1.07* 1.10*  -Continue monitoring  Hypokalemia: Resolved.   Acquired hypothyroidism -Continue Synthroid.   Dyslipidemia -Continue rosuvastatin.  Leukocytosis/bandemia: Likely from steroid. -Monitor  Hyperglycemia: Likely from steroid.  Glucose within acceptable range.  6.2% in 2022. -Monitor blood glucose with daily labs. -Check hemoglobin A1c  Tobacco use disorder: Reports smoking about a pack a day. -Encouraged cessation  Physical deconditioning: -PT/OT  Goal of care discussion: Prefers to remain full code but does not want to have life prolonged on a ventilator if unsuccessful.  Designate her husband as Ambulance person if she is not able to  Morbid obesity Body mass index is 37.06 kg/m.            DVT prophylaxis:  enoxaparin (LOVENOX) injection 40 mg Start: 01/28/22 2200  Code Status: Full code Family Communication: Updated patient's husband over the phone. Level of care: Progressive Status is: Inpatient Remains inpatient appropriate because: Acute on chronic respiratory failure with hypoxia and hypercapnia   Final disposition: Home once medically stable. Consultants:  Oncology Pulmonology  Sch Meds:  Scheduled Meds:  amiodarone  100 mg Oral Daily   arformoterol  15 mcg Nebulization BID   And   umeclidinium bromide  1 puff Inhalation Daily   aspirin  81 mg Oral QHS   Chlorhexidine Gluconate Cloth  6 each Topical Daily   enoxaparin (LOVENOX) injection  40 mg Subcutaneous Q24H   furosemide  20 mg Oral Daily   levothyroxine  12.5 mcg Oral Once per day on Mon Tue Wed Thu Fri Sat   levothyroxine  50 mcg Oral Q0600   methylPREDNISolone (SOLU-MEDROL) injection  80 mg Intravenous Q12H   metoprolol succinate  100 mg Oral Daily   nicotine  21 mg Transdermal Daily   mouth rinse  15 mL Mouth Rinse 4 times per day   potassium chloride  10 mEq Oral QODAY   rosuvastatin  40 mg Oral QHS   sodium chloride flush  3 mL Intravenous Q12H   Continuous Infusions:  azithromycin (ZITHROMAX) 500 mg in sodium chloride 0.9 % 250 mL IVPB Stopped (01/31/22 2202)   cefTRIAXone (ROCEPHIN)  IV Stopped (01/31/22 2045)   PRN Meds:.acetaminophen **OR** acetaminophen, albuterol, guaiFENesin-dextromethorphan, lip balm, ondansetron **OR** ondansetron (ZOFRAN) IV, mouth rinse, senna-docusate  Antimicrobials: Anti-infectives (From admission, onward)    Start     Dose/Rate Route Frequency Ordered Stop   01/30/22 2000  cefTRIAXone (ROCEPHIN) 2 g in sodium chloride 0.9 % 100 mL IVPB        2 g 200 mL/hr over 30 Minutes Intravenous Every 24 hours 01/30/22 0719     01/29/22 2000  azithromycin (ZITHROMAX) 500 mg in sodium chloride 0.9 % 250 mL IVPB        500 mg 250 mL/hr over 60 Minutes Intravenous  Every 24 hours 01/28/22 2140     01/29/22 2000  cefTRIAXone (ROCEPHIN) 1 g in sodium chloride 0.9 % 100 mL IVPB  Status:  Discontinued        1 g 200 mL/hr over 30 Minutes Intravenous Every 24 hours 01/28/22 2140 01/30/22 0719   01/28/22 2030  cefTRIAXone (ROCEPHIN) 1 g in sodium chloride 0.9 % 100 mL IVPB        1 g 200 mL/hr over 30 Minutes Intravenous  Once 01/28/22 2016 01/28/22 2105   01/28/22 2030  azithromycin (ZITHROMAX) 500 mg in sodium chloride 0.9 % 250 mL IVPB        500 mg 250 mL/hr over 60 Minutes Intravenous  Once 01/28/22 2016 01/28/22 2124        I have personally reviewed the following labs and images: CBC: Recent Labs  Lab 01/28/22 1554 01/28/22 1624 01/29/22 0407 01/30/22 0408 01/31/22 0515  WBC 10.9*  --  9.2 19.6* 22.9*  NEUTROABS 8.1*  --   --   --   --   HGB 11.3* 12.6 10.8* 10.9* 11.2*  HCT 37.7 37.0 36.6 36.7 37.0  MCV 91.3  --  91.5 90.4 91.4  PLT 303  --  295 365 418*   BMP &GFR Recent Labs  Lab 01/28/22 1554 01/28/22 1624 01/29/22 0407 01/30/22 0408 01/31/22 0515  NA 140 140 140 142 144  K 3.3* 3.4* 4.8 4.8 3.9  CL 97* 95* 100 104 105  CO2 32  --  32 31 30  GLUCOSE 128* 126* 141* 148* 121*  BUN 18 16 15 19  25*  CREATININE 1.07* 1.10* 1.04* 1.07* 1.10*  CALCIUM 9.1  --  8.9 9.3 9.0  MG  --   --   --  2.4 2.6*  PHOS  --   --   --  4.0 4.3   Estimated Creatinine Clearance: 48.4 mL/min (A) (by C-G formula based on SCr of 1.1 mg/dL (H)). Liver & Pancreas: Recent Labs  Lab 01/28/22 1554 01/30/22 0408 01/31/22 0515  AST 76*  --   --   ALT 95*  --   --   ALKPHOS 80  --   --   BILITOT 0.4  --   --   PROT 7.5  --   --   ALBUMIN 3.1* 2.6* 2.6*   No results for input(s): "LIPASE", "AMYLASE" in the last 168 hours. No results for input(s): "AMMONIA" in the last 168 hours. Diabetic: No results for input(s): "HGBA1C" in the last 72 hours. No results for input(s): "GLUCAP" in the last 168 hours. Cardiac Enzymes: No results for  input(s): "CKTOTAL", "CKMB", "CKMBINDEX", "TROPONINI" in the last 168 hours. No results for input(s): "PROBNP" in the last 8760 hours. Coagulation Profile: No results for input(s): "INR", "PROTIME" in the last 168 hours. Thyroid Function Tests: No results for  input(s): "TSH", "T4TOTAL", "FREET4", "T3FREE", "THYROIDAB" in the last 72 hours. Lipid Profile: No results for input(s): "CHOL", "HDL", "LDLCALC", "TRIG", "CHOLHDL", "LDLDIRECT" in the last 72 hours. Anemia Panel: No results for input(s): "VITAMINB12", "FOLATE", "FERRITIN", "TIBC", "IRON", "RETICCTPCT" in the last 72 hours. Urine analysis:    Component Value Date/Time   COLORURINE YELLOW 08/09/2021 0007   APPEARANCEUR HAZY (A) 08/09/2021 0007   LABSPEC 1.006 08/09/2021 0007   PHURINE 7.0 08/09/2021 0007   GLUCOSEU NEGATIVE 08/09/2021 0007   HGBUR NEGATIVE 08/09/2021 0007   BILIRUBINUR NEGATIVE 08/09/2021 0007   BILIRUBINUR Negative 07/24/2020 1623   KETONESUR NEGATIVE 08/09/2021 0007   PROTEINUR 30 (A) 08/09/2021 0007   UROBILINOGEN 0.2 07/24/2020 1623   UROBILINOGEN 0.2 09/13/2010 0906   NITRITE NEGATIVE 08/09/2021 0007   LEUKOCYTESUR MODERATE (A) 08/09/2021 0007   Sepsis Labs: Invalid input(s): "PROCALCITONIN", "LACTICIDVEN"  Microbiology: Recent Results (from the past 240 hour(s))  Resp panel by RT-PCR (RSV, Flu A&B, Covid) Anterior Nasal Swab     Status: None   Collection Time: 01/28/22  3:44 PM   Specimen: Anterior Nasal Swab  Result Value Ref Range Status   SARS Coronavirus 2 by RT PCR NEGATIVE NEGATIVE Final    Comment: (NOTE) SARS-CoV-2 target nucleic acids are NOT DETECTED.  The SARS-CoV-2 RNA is generally detectable in upper respiratory specimens during the acute phase of infection. The lowest concentration of SARS-CoV-2 viral copies this assay can detect is 138 copies/mL. A negative result does not preclude SARS-Cov-2 infection and should not be used as the sole basis for treatment or other patient  management decisions. A negative result may occur with  improper specimen collection/handling, submission of specimen other than nasopharyngeal swab, presence of viral mutation(s) within the areas targeted by this assay, and inadequate number of viral copies(<138 copies/mL). A negative result must be combined with clinical observations, patient history, and epidemiological information. The expected result is Negative.  Fact Sheet for Patients:  EntrepreneurPulse.com.au  Fact Sheet for Healthcare Providers:  IncredibleEmployment.be  This test is no t yet approved or cleared by the [place] FDA and  has been authorized for detection and/or diagnosis of SARS-CoV-2 by FDA under an Emergency Use Authorization (EUA). This EUA will remain  in effect (meaning this test can be used) for the duration of the COVID-19 declaration under Section 564(b)(1) of the Act, 21 U.S.C.section 360bbb-3(b)(1), unless the authorization is terminated  or revoked sooner.       Influenza A by PCR NEGATIVE NEGATIVE Final   Influenza B by PCR NEGATIVE NEGATIVE Final    Comment: (NOTE) The Xpert Xpress SARS-CoV-2/FLU/RSV plus assay is intended as an aid in the diagnosis of influenza from Nasopharyngeal swab specimens and should not be used as a sole basis for treatment. Nasal washings and aspirates are unacceptable for Xpert Xpress SARS-CoV-2/FLU/RSV testing.  Fact Sheet for Patients: EntrepreneurPulse.com.au  Fact Sheet for Healthcare Providers: IncredibleEmployment.be  This test is not yet approved or cleared by the [place] FDA and has been authorized for detection and/or diagnosis of SARS-CoV-2 by FDA under an Emergency Use Authorization (EUA). This EUA will remain in effect (meaning this test can be used) for the duration of the COVID-19 declaration under Section 564(b)(1) of the Act, 21 U.S.C. section 360bbb-3(b)(1),  unless the authorization is terminated or revoked.     Resp Syncytial Virus by PCR NEGATIVE NEGATIVE Final    Comment: (NOTE) Fact Sheet for Patients: EntrepreneurPulse.com.au  Fact Sheet for Healthcare Providers: IncredibleEmployment.be  This test is not  yet approved or cleared by the [place] and has been authorized for detection and/or diagnosis of SARS-CoV-2 by FDA under an Emergency Use Authorization (EUA). This EUA will remain in effect (meaning this test can be used) for the duration of the COVID-19 declaration under Section 564(b)(1) of the Act, 21 U.S.C. section 360bbb-3(b)(1), unless the authorization is terminated or revoked.  Performed at [facility], [place] [address], [place], [place] [address]   MRSA Next Gen by PCR, Nasal     Status: None   Collection Time: 01/28/22 10:28 PM  Result Value Ref Range Status   MRSA by PCR Next Gen NOT DETECTED NOT DETECTED Final    Comment: (NOTE) The GeneXpert MRSA Assay (FDA approved for NASAL specimens only), is one component of a comprehensive MRSA colonization surveillance program. It is not intended to diagnose MRSA infection nor to guide or monitor treatment for MRSA infections. Test performance is not FDA approved in patients less than 61 years old. Performed at [facility], [place] [address], [place], [place] [address]     Radiology Studies: No results found.    Adrian Specht T. [place]  If 7PM-7AM, please contact night-coverage [email or web address] 02/01/2022, 12:48 PM

## 2022-02-01 NOTE — Telephone Encounter (Signed)
Leaving triage message open to be sure pt has HFU after d/c

## 2022-02-01 NOTE — Telephone Encounter (Signed)
Yes, please make sure she has follow up upon discharge. Hope she recovers quickly! Thanks!

## 2022-02-01 NOTE — Consult Note (Signed)
NAME:  [name], MRN:  [id number], DOB:  [birth year], LOS: 4 ADMISSION DATE:  01/28/2022, CONSULTATION DATE:  02/01/22 REFERRING MD:  Cyndia Skeeters - TRH, CHIEF COMPLAINT:  hypoxia  History of Present Illness:  76 yo F with Stg IIIa NSCLC s/p carboplatin and paclitaxel, and currently on immunotherapy Imfinzi q4wk, hx COPD, active tobacco use, chronic hypoxic respiratory failure on baseline 3-5L, Afib not on AC, cerebral amyloid angiopathy, who was admitted 12/11 for worse hypoxia, after having SpO2 70-80 on 5LNc at her PCP office earlier that day.  Admitted to [facility], CTA neg for PE but shows new GGOs, interlobular septal thickening, interstitial opacities. Started on azithro rocephin, steroids, lasix  Onc consulted 12/12, adding Possible immunotherapy related pneumonitis to ddx   PCCM consulted 12/15 for persistent hypoxia, on 10 HFNC at time of consultation   Pertinent  Medical History  Stg IIIa NSCLC  COPD Chronic hypoxia  Afib Amyloid angiopathy of brain  Significant Hospital Events: Including procedures, antibiotic start and stop dates in addition to other pertinent events   12/11 admit for hypoxia. Azithro rocephin, steroids, lasix 12/12 onc consult. Steroids increased  12/15 persistently elevated O2 req -- 10 HFNC, PCCM consulted   Interim History / Subjective:  10L HFNC   Objective   Blood pressure 134/81, pulse 65, temperature 98 F (36.7 C), temperature source Oral, resp. rate 15, height 5\' 3"  (1.6 m), weight 94.9 kg, SpO2 (!) 87 %.        Intake/Output Summary (Last 24 hours) at 02/01/2022 1306 Last data filed at 02/01/2022 0900 Gross per 24 hour  Intake 416.67 ml  Output 3 ml  Net 413.67 ml   Filed Weights   01/28/22 2235 01/29/22 0440  Weight: 94.4 kg 94.9 kg    Examination: General: well appearing older adult F NAD  HENT: NCAT Lungs: No crackles, wheeze, rhonchi. Conversational dyspnea Cardiovascular: rr s1s2 Abdomen: round soft ndnt  Extremities: trace  BLE edema Neuro: AAOx3  GU: defer  Resolved Hospital Problem list     Assessment & Plan:   Acute on chronic hypoxic respiratory failure Stg IIIa NSCLC, currently on immunotherapy ( Imfinzi)  COPD  -imaging reviewed. Primary concern is that this might be immunotherapy related pneumonitis. Ddx atypical PNA, some pulm edema. CTA neg for PE.  P -cont steroids -- stay on IV for now -cont rocephin azithro -IS, mobility -wean O2 as able, goal > 88% -brovana, budesonide, yupelri  -will send an RVP, infectious workup thus far has been unremarkable    Best Practice (right click and "Reselect all SmartList Selections" daily)   Per primary   Labs   CBC: Recent Labs  Lab 01/28/22 1554 01/28/22 1624 01/29/22 0407 01/30/22 0408 01/31/22 0515  WBC 10.9*  --  9.2 19.6* 22.9*  NEUTROABS 8.1*  --   --   --   --   HGB 11.3* 12.6 10.8* 10.9* 11.2*  HCT 37.7 37.0 36.6 36.7 37.0  MCV 91.3  --  91.5 90.4 91.4  PLT 303  --  295 365 418*    Basic Metabolic Panel: Recent Labs  Lab 01/28/22 1554 01/28/22 1624 01/29/22 0407 01/30/22 0408 01/31/22 0515  NA 140 140 140 142 144  K 3.3* 3.4* 4.8 4.8 3.9  CL 97* 95* 100 104 105  CO2 32  --  32 31 30  GLUCOSE 128* 126* 141* 148* 121*  BUN 18 16 15 19  25*  CREATININE 1.07* 1.10* 1.04* 1.07* 1.10*  CALCIUM 9.1  --  8.9 9.3 9.0  MG  --   --   --  2.4 2.6*  PHOS  --   --   --  4.0 4.3   GFR: Estimated Creatinine Clearance: 48.4 mL/min (A) (by C-G formula based on SCr of 1.1 mg/dL (H)). Recent Labs  Lab 01/28/22 1554 01/29/22 0407 01/30/22 0408 01/31/22 0515  WBC 10.9* 9.2 19.6* 22.9*    Liver Function Tests: Recent Labs  Lab 01/28/22 1554 01/30/22 0408 01/31/22 0515  AST 76*  --   --   ALT 95*  --   --   ALKPHOS 80  --   --   BILITOT 0.4  --   --   PROT 7.5  --   --   ALBUMIN 3.1* 2.6* 2.6*   No results for input(s): "LIPASE", "AMYLASE" in the last 168 hours. No results for input(s): "AMMONIA" in the last 168  hours.  ABG    Component Value Date/Time   PHART 7.48 (H) 01/28/2022 1544   PCO2ART 54 (H) 01/28/2022 1544   PO2ART 122 (H) 01/28/2022 1544   HCO3 40.2 (H) 01/28/2022 1544   TCO2 33 (H) 01/28/2022 1624   ACIDBASEDEF 1.0 07/07/2010 1023   O2SAT 100 01/28/2022 1544     Coagulation Profile: No results for input(s): "INR", "PROTIME" in the last 168 hours.  Cardiac Enzymes: No results for input(s): "CKTOTAL", "CKMB", "CKMBINDEX", "TROPONINI" in the last 168 hours.  HbA1C: Hgb A1c MFr Bld  Date/Time Value Ref Range Status  10/25/2020 09:35 AM 6.2 (H) 4.8 - 5.6 % Final    Comment:             Prediabetes: 5.7 - 6.4          Diabetes: >6.4          Glycemic control for adults with diabetes: <7.0   06/14/2019 10:52 AM 6.1 (H) 4.8 - 5.6 % Final    Comment:             Prediabetes: 5.7 - 6.4          Diabetes: >6.4          Glycemic control for adults with diabetes: <7.0     CBG: No results for input(s): "GLUCAP" in the last 168 hours.  Review of Systems:   Review of Systems  Constitutional:  Positive for malaise/fatigue.  HENT: Negative.    Respiratory:  Positive for shortness of breath.   Cardiovascular: Negative.   Gastrointestinal: Negative.   Musculoskeletal: Negative.   Skin: Negative.   Neurological:  Positive for dizziness.  Endo/Heme/Allergies: Negative.   Psychiatric/Behavioral: Negative.       Past Medical History:  She,  has a past medical history of CAD (coronary artery disease), Cancer ([place]), COPD (chronic obstructive pulmonary disease) ([place]), Dyslipidemia, GERD (gastroesophageal reflux disease), History of radiation therapy, History of radiation therapy, HTN (hypertension), Hypothyroidism, Myocardial infarct ([place]), Non-small cell lung cancer ([place]) (08/19/2010), and Tobacco abuse.   Surgical History:   Past Surgical History:  Procedure Laterality Date   CARDIAC CATHETERIZATION  07/06/2010   see CABG report - pt sent to OR   CARDIOVASCULAR STRESS TEST   09/11/2010   R/S MV - normal perfusion in all regions, EF 46%, no scintigraphic evidence of inducible myocardial ischemia; global LV systolic function mildly reduced; no significant wall motion abnormalities noted; Exercise capacity 7 METS; EKG negative for ischemia; low risk study, no signifcant change from previous study 08/2003   CORONARY ARTERY BYPASS GRAFT  07/11/2010  LIMA to LAD; SVG to 2nd branch OM; SVG to posterior descending artery   IR IMAGING GUIDED PORT INSERTION  08/16/2021   LEFT VATS  09/17/2010   TEE WITHOUT CARDIOVERSION  07/06/2010   during emergent CABG surgery; 2-3+ mitral regurgitation   VIDEO BRONCHOSCOPY N/A 07/27/2021   Procedure: VIDEO BRONCHOSCOPY;  Surgeon: Melrose Nakayama, MD;  Location: [place];  Service: Thoracic;  Laterality: N/A;   VIDEO BRONCHOSCOPY WITH ENDOBRONCHIAL ULTRASOUND N/A 07/27/2021   Procedure: VIDEO BRONCHOSCOPY WITH ENDOBRONCHIAL ULTRASOUND;  Surgeon: Melrose Nakayama, MD;  Location: [facility] OR;  Service: Thoracic;  Laterality: N/A;     Social History:   reports that she quit smoking about 6 months ago. Her smoking use included cigarettes. She has a 58.00 pack-year smoking history. She has quit using smokeless tobacco. She reports that she does not drink alcohol and does not use drugs.   Family History:  Her family history includes Aplastic anemia in her maternal grandfather; Heart attack in her father; Hypothyroidism in her father; Stroke in her paternal grandfather.   Allergies Allergies  Allergen Reactions   Codeine Other (See Comments)    Unknown reaction Patient avoids this medication     Home Medications  Prior to Admission medications   Medication Sig Start Date End Date Taking? Authorizing Provider  acetaminophen (TYLENOL) 500 MG tablet Take 500 mg by mouth every 6 (six) hours as needed for moderate pain or fever.   Yes [provider]  albuterol (PROAIR HFA) 108 (90 Base) MCG/ACT inhaler 2 puffs up to every 4 hours as  needed only  if your can't catch your breath Patient taking differently: Inhale 2 puffs into the lungs every 4 (four) hours as needed for wheezing or shortness of breath. 03/27/21  Yes Tanda Rockers, MD  albuterol (PROVENTIL) (2.5 MG/3ML) 0.083% nebulizer solution Take 2.5 mg by nebulization 3 (three) times daily as needed for wheezing or shortness of breath.   Yes [provider]  amiodarone (PACERONE) 100 MG tablet TAKE 1 TABLET BY MOUTH EVERY DAY 01/07/22  Yes Troy Sine, MD  aspirin 81 MG tablet Take 81 mg by mouth at bedtime.   Yes [provider]  Coenzyme Q10 (CO Q 10 PO) Take 1 capsule by mouth daily.   Yes [provider]  furosemide (LASIX) 20 MG tablet Take 1 tablet po BID for 5 days then go back to taking 1 tablet po QD Patient taking differently: Take 20 mg by mouth daily. 10/24/21  Yes Boscia, Greer Ee, NP  Glycopyrrolate-Formoterol (BEVESPI AEROSPHERE) 9-4.8 MCG/ACT AERO Inhale 2 puffs into the lungs 2 (two) times daily. Patient taking differently: Inhale 2 puffs into the lungs daily. 10/18/21  Yes Cobb, Karie Schwalbe, NP  KLOR-CON M10 10 MEQ tablet TAKE 1 TABLET BY MOUTH EVERY DAY Patient taking differently: Take 10 mEq by mouth every other day. 11/12/21  Yes Boscia, Greer Ee, NP  levothyroxine (SYNTHROID) 25 MCG tablet Take 0.5 tablets (12.5 mcg total) by mouth daily. In addition to the 50 mcg. 01/09/22  Yes Boscia, Greer Ee, NP  levothyroxine (SYNTHROID) 50 MCG tablet Take 1 tablet (50 mcg total) by mouth See admin instructions. Along with 12.5 mcg qd except on Sunday (pt just takes 50 mcg) 01/09/22  Yes [name], NP  lidocaine-prilocaine (EMLA) cream Apply 1 Application topically as needed. Patient taking differently: Apply 1 Application topically as needed (pain). 08/06/21  Yes Heilingoetter, Cassandra L, PA-C  metoprolol succinate (TOPROL-XL) 100 MG 24 hr  tablet Take 1 tablet (100 mg total) by mouth daily. Take with or immediately following a  meal. 10/10/21  Yes Boscia, Heather E, NP  naproxen sodium (ALEVE) 220 MG tablet Take 220 mg by mouth 2 (two) times daily as needed (pain).   Yes [provider]  Omega-3 Fatty Acids (OMEGA-3 PO) Take 1,000 mg by mouth in the morning and at bedtime.   Yes [provider]  rosuvastatin (CRESTOR) 40 MG tablet Take 1 tablet (40 mg total) by mouth daily. 09/27/21  Yes Boscia, Heather E, NP  OXYGEN Inhale 3.5 L into the lungs continuous.    [provider]     Critical care time: n/a       Eliseo Gum MSN, AGACNP-BC [place] for pager  02/01/2022, 1:58 PM

## 2022-02-01 NOTE — Evaluation (Signed)
Occupational Therapy Evaluation Patient Details Name: [name] MRN: [id number] DOB: [birth year] Today's Date: 02/01/2022   History of Present Illness 76 year old F with PMH of COPD/chronic hypoxic RF on 2 to 3 L, NSCLC of left lung s/p chemoradiation currently on immunotherapy, paroxysmal A-fib, amyloid angiopathy of brain, hypothyroidism, hyperlipidemia and tobacco use disorder presented to to ED 01/28/22 with lightheadedness, respiratory failure with hypoxia with oxygen saturation in the range of 70 to 78%, required NRB   Clinical Impression   Patient evaluated by Occupational Therapy with no further acute OT needs identified. All education has been completed, including handouts and discussion on energy conservation and possible DME such as a rollator and adaptive equipment options which would support energy conservation, and the patient has no further questions.  See below for any follow-up Occupational Therapy or equipment needs. OT is signing off. Thank you for this referral.       Recommendations for follow up therapy are one component of a multi-disciplinary discharge planning process, led by the attending physician.  Recommendations may be updated based on patient status, additional functional criteria and insurance authorization.   Follow Up Recommendations  No OT follow up     Assistance Recommended at Discharge Set up Supervision/Assistance  Patient can return home with the following Assistance with cooking/housework    Functional Status Assessment  Patient has had a recent decline in their functional status and demonstrates the ability to make significant improvements in function in a reasonable and predictable amount of time.  Equipment Recommendations  Other (comment) (Rollator.)    Recommendations for Other Services       Precautions / Restrictions Precautions Precaution Comments: on 11 L HFNC, states uses 10 LPM at home??  Pt clarified that she was confused  and uses 3.5L at baseline. Restrictions Weight Bearing Restrictions: No      Mobility Bed Mobility               General bed mobility comments: Pt received in recliner.    Transfers       Sit to Stand: Modified independent (Device/Increase time)           General transfer comment: Pt ambulated within room, completed 360 turns x 2 without LOB and no use of AD.      Balance Overall balance assessment: Mild deficits observed, not formally tested                                         ADL either performed or assessed with clinical judgement   ADL Overall ADL's : At baseline                                       General ADL Comments: Pt able to perform ADLs and functional mobility at her baseline with exception of expected help for lines and equipment, and with desaturation as low as 74 on 8-10L (Began at 8L and bumped to 10 due to frequent desatting to low 80s and 70s. RN notified) and cues to pace self, rest and breath as needed.     Vision Baseline Vision/History: 1 Wears glasses (readers) Patient Visual Report: No change from baseline Vision Assessment?: No apparent visual deficits     Perception     Praxis      Pertinent Vitals/Pain Pain  Assessment Pain Assessment: No/denies pain     Hand Dominance Right   Extremity/Trunk Assessment Upper Extremity Assessment Upper Extremity Assessment: Overall WFL for tasks assessed   Lower Extremity Assessment Lower Extremity Assessment: Overall WFL for tasks assessed   Cervical / Trunk Assessment Cervical / Trunk Assessment: Normal   Communication Communication Communication: No difficulties   Cognition Arousal/Alertness: Awake/alert Behavior During Therapy: Agitated Overall Cognitive Status: Within Functional Limits for tasks assessed                                       General Comments       Exercises     Shoulder Instructions      Home  Living Family/patient expects to be discharged to:: Private residence Living Arrangements: Spouse/significant other Available Help at Discharge: Family;Available 24 hours/day Type of Home: House Home Access: Stairs to enter [employer] of Steps: 1 Entrance Stairs-Rails: Right;Left Home Layout: One level     Bathroom Shower/Tub: Occupational psychologist: Standard     Home Equipment: Shower seat - built in;Hand held shower head          Prior Functioning/Environment Prior Level of Function : Independent/Modified Independent;Driving       Physical Assist : ADLs (physical)   ADLs (physical): IADLs   ADLs Comments: Spouse does all cooking, cleaning, laundry and pt folds the laundry.        OT Problem List: Decreased activity tolerance      OT Treatment/Interventions:      OT Goals(Current goals can be found in the care plan section) Acute Rehab OT Goals Patient Stated Goal: To go home OT Goal Formulation: All assessment and education complete, DC therapy Potential to Achieve Goals: Good ADL Goals Additional ADL Goal #1: Patient will identify at least 3 energy conservation strategies to employ at home in order to maximize function and quality of life and decrease caregiver burden while preventing exacerbation of symptoms and rehospitalization.  OT Frequency:      Co-evaluation              AM-PAC OT "6 Clicks" Daily Activity     Outcome Measure Help from another person eating meals?: None Help from another person taking care of personal grooming?: None Help from another person toileting, which includes using toliet, bedpan, or urinal?: None Help from another person bathing (including washing, rinsing, drying)?: None Help from another person to put on and taking off regular upper body clothing?: None Help from another person to put on and taking off regular lower body clothing?: None 6 Click Score: 24   End of Session Equipment Utilized  During Treatment:  (none) Nurse Communication: Other (comment) (Change in pt's O2)  Activity Tolerance: Patient tolerated treatment well Patient left: in chair;with call bell/phone within reach  OT Visit Diagnosis: Dizziness and giddiness (R42)                Time: 5726-2035 OT Time Calculation (min): 35 min Charges:  OT General Charges $OT Visit: 1 Visit OT Evaluation $OT Eval Low Complexity: 1 Low OT Treatments $Self Care/Home Management : 8-22 mins  Anderson Malta, OT Acute Rehab Services Office: [phone] 02/01/2022  Julien Girt 02/01/2022, 12:55 PM

## 2022-02-02 ENCOUNTER — Inpatient Hospital Stay (HOSPITAL_COMMUNITY): Payer: Medicare HMO

## 2022-02-02 DIAGNOSIS — J9622 Acute and chronic respiratory failure with hypercapnia: Secondary | ICD-10-CM | POA: Diagnosis not present

## 2022-02-02 DIAGNOSIS — J984 Other disorders of lung: Secondary | ICD-10-CM | POA: Diagnosis not present

## 2022-02-02 DIAGNOSIS — J9621 Acute and chronic respiratory failure with hypoxia: Secondary | ICD-10-CM | POA: Diagnosis not present

## 2022-02-02 LAB — RENAL FUNCTION PANEL
Albumin: 2.7 g/dL — ABNORMAL LOW (ref 3.5–5.0)
Anion gap: 10 (ref 5–15)
BUN: 25 mg/dL — ABNORMAL HIGH (ref 8–23)
CO2: 32 mmol/L (ref 22–32)
Calcium: 8.4 mg/dL — ABNORMAL LOW (ref 8.9–10.3)
Chloride: 103 mmol/L (ref 98–111)
Creatinine, Ser: 1 mg/dL (ref 0.44–1.00)
GFR, Estimated: 59 mL/min — ABNORMAL LOW (ref >60)
Glucose, Bld: 123 mg/dL — ABNORMAL HIGH (ref 70–99)
Phosphorus: 4.1 mg/dL (ref 2.5–4.6)
Potassium: 3.9 mmol/L (ref 3.5–5.1)
Sodium: 145 mmol/L (ref 135–145)

## 2022-02-02 LAB — CBC
HCT: 36.1 % (ref 36.0–46.0)
Hemoglobin: 10.9 g/dL — ABNORMAL LOW (ref 12.0–15.0)
MCH: 27.4 pg (ref 26.0–34.0)
MCHC: 30.2 g/dL (ref 30.0–36.0)
MCV: 90.7 fL (ref 80.0–100.0)
Platelets: 374 10*3/uL (ref 150–400)
RBC: 3.98 MIL/uL (ref 3.87–5.11)
RDW: 15.1 % (ref 11.5–15.5)
WBC: 17.1 10*3/uL — ABNORMAL HIGH (ref 4.0–10.5)
nRBC: 0 % (ref 0.0–0.2)

## 2022-02-02 LAB — MAGNESIUM: Magnesium: 2.8 mg/dL — ABNORMAL HIGH (ref 1.7–2.4)

## 2022-02-02 MED ORDER — SODIUM CHLORIDE 0.9% FLUSH: 10.0000 mL | INTRAVENOUS | Status: DC | PRN

## 2022-02-02 NOTE — Progress Notes (Signed)
Mobility Specialist - Progress Note   02/02/22 1100  Mobility  Activity Transferred to/from [facility]  Level of Assistance Modified independent, requires aide device or extra time  Assistive Device BSC  Range of Motion/Exercises Active  Activity Response Tolerated well  Mobility Referral No  $Mobility charge 1 Mobility   Pt was found on recliner chair stating she needed to use the BSC. Pt was able to transfer to and from without difficulty. At EOS was left with necessities in reach and NT and RN notified.  Ferd Hibbs Mobility Specialist

## 2022-02-02 NOTE — Plan of Care (Signed)
  Problem: Activity: Goal: Ability to tolerate increased activity will improve Outcome: Progressing   Problem: Clinical Measurements: Goal: Ability to maintain a body temperature in the normal range will improve Outcome: Progressing   Problem: Respiratory: Goal: Ability to maintain adequate ventilation will improve Outcome: Progressing   

## 2022-02-02 NOTE — Plan of Care (Signed)
  Problem: Activity: Goal: Ability to tolerate increased activity will improve Outcome: Progressing   Problem: Clinical Measurements: Goal: Ability to maintain a body temperature in the normal range will improve Outcome: Progressing   Problem: Respiratory: Goal: Ability to maintain adequate ventilation will improve Outcome: Progressing Goal: Ability to maintain a clear airway will improve Outcome: Progressing   Problem: Education: Goal: Knowledge of General Education information will improve Description: Including pain rating scale, medication(s)/side effects and non-pharmacologic comfort measures Outcome: Progressing   Problem: Health Behavior/Discharge Planning: Goal: Ability to manage health-related needs will improve Outcome: Progressing   Problem: Clinical Measurements: Goal: Ability to maintain clinical measurements within normal limits will improve Outcome: Progressing Goal: Will remain free from infection Outcome: Progressing Goal: Diagnostic test results will improve Outcome: Progressing Goal: Respiratory complications will improve Outcome: Progressing Goal: Cardiovascular complication will be avoided Outcome: Progressing   Problem: Health Behavior/Discharge Planning: Goal: Ability to manage health-related needs will improve Outcome: Progressing   Problem: Activity: Goal: Risk for activity intolerance will decrease Outcome: Progressing   Problem: Nutrition: Goal: Adequate nutrition will be maintained Outcome: Progressing   Problem: Coping: Goal: Level of anxiety will decrease Outcome: Progressing   Problem: Elimination: Goal: Will not experience complications related to bowel motility Outcome: Progressing Goal: Will not experience complications related to urinary retention Outcome: Progressing   Problem: Pain Managment: Goal: General experience of comfort will improve Outcome: Progressing   Problem: Safety: Goal: Ability to remain free from  injury will improve Outcome: Progressing   Problem: Skin Integrity: Goal: Risk for impaired skin integrity will decrease Outcome: Progressing

## 2022-02-02 NOTE — Progress Notes (Addendum)
1910 patient alert x 4 husband at bedside patient on 8L [place] HF bed alarm ringing patient trying to readjust lines and tubes since getting back into bed assistance provided.  2000 IV ABT given as ordered  2100 All meds given PO and IV as ordered    02/03/22 0430 AST 207 was 76 on the 11th and ALT 422 was 95 on the 11th no pain report in abd or with palpation no pain reported at all.

## 2022-02-02 NOTE — Progress Notes (Signed)
Progress Note   Patient: [name] DGU:[id number] DOB: 08/01/1945 DOA: 01/28/2022     5 DOS: the patient was seen and examined on 02/02/2022   Brief hospital course: 76 year old F with PMH of COPD/chronic hypoxic RF on 3.5L recently increased to 5L, NSCLC of left lung s/p chemoradiation currently on immunotherapy, paroxysmal A-fib not on AC due to amyloid angiopathy of brain, hypothyroidism, hyperlipidemia and tobacco use disorder presented to PCP with lightheadedness, wooziness and increased oxygen requirement for about 5 days, and sent to ED due to acute on chronic respiratory failure with hypoxia with oxygen saturation in the range of 70 to 78% on 5 L by [place].   In ED, she initially required NRB but weaned down to 10 L by HFNC later.  ABG with mild hypercarbia.  BNP and serial troponin negative.  CTA chest negative for PE but new GGO opacities with interlobular septal thickening throughout the right lung, persistent irregular appearing small left pleural effusion, progressive interstitial alveolar opacity throughout the LLL and slightly increased multiple mildly prominent mediastinal and right healer lymph nodes.  Patient was started on IV ceftriaxone and Solu-Medrol for possible pneumonia/immunotherapy related pneumonitis.  Oncology consulted and recommended CAP coverage and higher dose of steroid.   Patient continues to require 10 L by HFNC but has no respiratory distress.  Pulmonology consulted.  Assessment and Plan: Acute on chronic respiratory failure with hypoxia and hypercapnia:  On 3.5 L at baseline but lately required up to 5 L at home.  She has no respiratory distress but requiring 10 L to maintain saturation in upper 80s.  COVID-19, influenza and RSV PCR nonreactive.  Serial troponin and BNP are within normal.  Unfortunately, she continues to smoke about a pack a day.  CTA chest negative for PE but new GGO opacities with interlobular septal thickening throughout the right lung,  persistent irregular appearing small left pleural effusion, progressive interstitial alveolar opacity throughout the LLL and slightly increased multiple mildly prominent mediastinal and right healer lymph nodes.  She is at risk for pneumonia and/or pneumonitis with immunotherapy.  -Continue holding immunotherapy until outpatient follow-up with oncology. -Continue ceftriaxone, Solu-Medrol and Azithromycin. -Continue Brovana.  Change Incruse Ellipta to [employer].  Add budesonide.  DuoNebs as needed. -Wean oxygen as able.  Minimum oxygen to keep saturation above 87%. -Continue home Lasix 20 mg daily -Incentive spirometry, OOB, PT/OT -Encouraged smoking cessation.  Nicotine patch ordered. -Consulted pulmonology. Concerns that this might be immunotherapy related or amiodarone related pneumonitis.  -Discussed with Cardiology who agrees with holding further amiodarone. Cardiology to see formally   COPD GOLD III:  no respiratory distress other than increased oxygen requirement.  -Management as above.   Non-small cell lung cancer:follows with oncology, Dr. Curt Bears.  History of remote LUL lobectomy.  S/p recent chemoradiation now on active immunotherapy with Durvalumab. -Appreciate input by oncology-antibiotics and steroid as above, and hold immunotherapy   Paroxysmal atrial fibrillation: In sinus rhythm.  Not on anticoagulation due to concern for high bleeding risk in the setting of cerebral amyloid angiopathy. -Continue Toprol-XL and aspirin -Amiodarone now stopped, per above. Cardiology to see officially   History of CAD/CABG/stent: Stable. -Continue home medication   Elevated creatinine: AKI ruled out.  Stable. -Continue monitoring   Hypokalemia: Resolved.   Acquired hypothyroidism -Continue Synthroid.   Dyslipidemia -Continue rosuvastatin.   Leukocytosis/bandemia: Likely from steroid. -Monitor   Hyperglycemia: Likely from steroid.  Glucose within acceptable range.  6.2% in  2022. -Monitor blood glucose with daily labs. -  Check hemoglobin A1c   Tobacco use disorder: Reports smoking about a pack a day. -Encouraged cessation   Physical deconditioning: -PT/OT   Goal of care discussion: Prefers to remain full code but does not want to have life prolonged on a ventilator if unsuccessful.  Designate her husband as Ambulance person if she is not able to   Morbid obesity Body mass index is 37.06 kg/m.       Subjective: Reports breathing better today  Physical Exam: Vitals:   02/02/22 0600 02/02/22 0808 02/02/22 0811 02/02/22 1432  BP: 120/86   129/63  Pulse: 67   72  Resp: 18 (!) 22 (!) 21 (!) 21  Temp: 98.2 F (36.8 C)   97.8 F (36.6 C)  TempSrc: Oral   Oral  SpO2: 94% 94% 94% 93%  Weight:      Height:       General exam: Awake, laying in bed, in nad Respiratory system: Normal respiratory effort, no wheezing Cardiovascular system: regular rate, s1, s2 Gastrointestinal system: Soft, nondistended, positive BS Central nervous system: CN2-12 grossly intact, strength intact Extremities: Perfused, no clubbing Skin: Normal skin turgor, no notable skin lesions seen Psychiatry: Mood normal // no visual hallucinations   Data Reviewed:  Labs reviewed: Na 145, K 3.9, Cr 1.00, WBC 17.1, Hgb 10.9, Plts 374   Family Communication: Pt in room, family at bedside  Disposition: Status is: Inpatient Remains inpatient appropriate because: Severity of illness  Planned Discharge Destination: Home    Author: Marylu Lund, MD 02/02/2022 6:33 PM  For on call review [email or web address].

## 2022-02-02 NOTE — Progress Notes (Signed)
NAME:  [name], MRN:  [id number], DOB:  1945/02/20, LOS: 5 ADMISSION DATE:  01/28/2022, CONSULTATION DATE:  02/01/22 REFERRING MD:  Cyndia Skeeters - TRH, CHIEF COMPLAINT:  hypoxia  History of Present Illness:  76 yo F with Stg IIIa NSCLC s/p carboplatin and paclitaxel, and currently on immunotherapy Imfinzi q4wk, hx COPD, active tobacco use, chronic hypoxic respiratory failure on baseline 3-5L, Afib not on AC, cerebral amyloid angiopathy, who was admitted 12/11 for worse hypoxia, after having SpO2 70-80 on 5LNc at her PCP office earlier that day.  Admitted to [facility], CTA neg for PE but shows new GGOs, interlobular septal thickening, interstitial opacities. Started on azithro rocephin, steroids, lasix  Onc consulted 12/12, adding Possible immunotherapy related pneumonitis to ddx   PCCM consulted 12/15 for persistent hypoxia, on 10 HFNC at time of consultation  She was started on Imfinzi 8/31, status post 5 cycles  Pertinent  Medical History  Stg IIIa NSCLC  COPD Chronic hypoxia  Afib Amyloid angiopathy of brain  Significant Hospital Events: Including procedures, antibiotic start and stop dates in addition to other pertinent events   12/11 admit for hypoxia. Azithro rocephin, steroids, lasix 12/12 onc consult. Steroids increased  12/15 persistently elevated O2 req -- 10 HFNC, PCCM consulted   Interim History / Subjective:   Remains on 10 L high flow nasal cannula although she feels that she is less short of breath and oxygen saturation does not drop as much with movement or exertion Afebrile  Objective   Blood pressure 120/86, pulse 67, temperature 98.2 F (36.8 C), temperature source Oral, resp. rate (!) 21, height 5\' 3"  (1.6 m), weight 95.2 kg, SpO2 94 %.        Intake/Output Summary (Last 24 hours) at 02/02/2022 6734 Last data filed at 02/02/2022 0530 Gross per 24 hour  Intake 710.12 ml  Output 1352 ml  Net -641.88 ml    Filed Weights   01/28/22 2235 01/29/22 0440  02/02/22 0500  Weight: 94.4 kg 94.9 kg 95.2 kg    Examination: General: well appearing older adult woman sitting up in bed, able to speak in full sentences without dyspnea HENT: No JVD ED, mild pallor, no icterus Lungs: No crackles, wheeze, rhonchi.  No accessory muscle use Cardiovascular: rr s1s2 Abdomen: round soft ndnt  Extremities: No edema Neuro: AAOx3    Labs show normal electrolytes, mild decrease leukocytosis stable anemia CT angiogram chest reviewed  Resolved Hospital Problem list     Assessment & Plan:   Acute on chronic hypoxic respiratory failure Stg IIIa NSCLC COPD  -Primary concern is that this might be immunotherapy related or amiodarone related pneumonitis.  Ddx atypical PNA, some pulm edema. CTA neg for PE.  Doubt that she had minimal groundglass changes on the right on CT scan 8/19. Amiodarone was started 07/2021   -RVP negative, urine strep antigen negative, urine Legionella negative  P -cont with Solu-Medrol 80 every 12, once she has clinical improvement with decreased FiO2 levels we can drop to 1 mg/kg of prednisone -cont rocephin azithro -IS, mobility -wean O2 as able, goal > 88% -brovana, budesonide, yupelri  -Obtain chest x-ray  Paroxysmal atrial fibrillation -on amiodarone. Will have to talk with cardiology if this can be stopped  Updated patient in detail today, explained that this might mean that she would not be a candidate for further immunotherapy  Best Practice (right click and "Reselect all SmartList Selections" daily)   Per primary   Labs   CBC: Recent Labs  Lab 01/28/22 1554 01/28/22 1624 01/29/22 0407 01/30/22 0408 01/31/22 0515 02/01/22 1239 02/02/22 0249  WBC 10.9*  --  9.2 19.6* 22.9* 20.6* 17.1*  NEUTROABS 8.1*  --   --   --   --   --   --   HGB 11.3*   < > 10.8* 10.9* 11.2* 11.8* 10.9*  HCT 37.7   < > 36.6 36.7 37.0 39.6 36.1  MCV 91.3  --  91.5 90.4 91.4 91.9 90.7  PLT 303  --  295 365 418* 432* 374   < > =  values in this interval not displayed.     Basic Metabolic Panel: Recent Labs  Lab 01/29/22 0407 01/30/22 0408 01/31/22 0515 02/01/22 1239 02/02/22 0249  NA 140 142 144 142 145  K 4.8 4.8 3.9 3.5 3.9  CL 100 104 105 103 103  CO2 32 31 30 31  32  GLUCOSE 141* 148* 121* 209* 123*  BUN 15 19 25* 22 25*  CREATININE 1.04* 1.07* 1.10* 0.83 1.00  CALCIUM 8.9 9.3 9.0 8.6* 8.4*  MG  --  2.4 2.6* 2.6* 2.8*  PHOS  --  4.0 4.3 2.7 4.1    GFR: Estimated Creatinine Clearance: 53.3 mL/min (by C-G formula based on SCr of 1 mg/dL). Recent Labs  Lab 01/30/22 0408 01/31/22 0515 02/01/22 1239 02/02/22 0249  WBC 19.6* 22.9* 20.6* 17.1*     Liver Function Tests: Recent Labs  Lab 01/28/22 1554 01/30/22 0408 01/31/22 0515 02/01/22 1239 02/02/22 0249  AST 76*  --   --   --   --   ALT 95*  --   --   --   --   ALKPHOS 80  --   --   --   --   BILITOT 0.4  --   --   --   --   PROT 7.5  --   --   --   --   ALBUMIN 3.1* 2.6* 2.6* 2.9* 2.7*    No results for input(s): "LIPASE", "AMYLASE" in the last 168 hours. No results for input(s): "AMMONIA" in the last 168 hours.  ABG    Component Value Date/Time   PHART 7.48 (H) 01/28/2022 1544   PCO2ART 54 (H) 01/28/2022 1544   PO2ART 122 (H) 01/28/2022 1544   HCO3 40.2 (H) 01/28/2022 1544   TCO2 33 (H) 01/28/2022 1624   ACIDBASEDEF 1.0 07/07/2010 1023   O2SAT 100 01/28/2022 1544     Coagulation Profile: No results for input(s): "INR", "PROTIME" in the last 168 hours.  Cardiac Enzymes: No results for input(s): "CKTOTAL", "CKMB", "CKMBINDEX", "TROPONINI" in the last 168 hours.  HbA1C: Hgb A1c MFr Bld  Date/Time Value Ref Range Status  10/25/2020 09:35 AM 6.2 (H) 4.8 - 5.6 % Final    Comment:             Prediabetes: 5.7 - 6.4          Diabetes: >6.4          Glycemic control for adults with diabetes: <7.0   06/14/2019 10:52 AM 6.1 (H) 4.8 - 5.6 % Final    Comment:             Prediabetes: 5.7 - 6.4          Diabetes: >6.4           Glycemic control for adults with diabetes: <7.0     CBG: No results for input(s): "GLUCAP" in the last 168 hours. 02/02/2022, 8:22 AM  Kara Mead MD. Shade Flood.  Pulmonary & Critical care Pager : [phone]  If no response to pager , please call [phone] until 7 pm After 7:00 pm call Elink  [phone]   02/02/2022

## 2022-02-03 DIAGNOSIS — J9622 Acute and chronic respiratory failure with hypercapnia: Secondary | ICD-10-CM | POA: Diagnosis not present

## 2022-02-03 DIAGNOSIS — J849 Interstitial pulmonary disease, unspecified: Secondary | ICD-10-CM | POA: Diagnosis not present

## 2022-02-03 DIAGNOSIS — I480 Paroxysmal atrial fibrillation: Secondary | ICD-10-CM | POA: Diagnosis not present

## 2022-02-03 DIAGNOSIS — J9621 Acute and chronic respiratory failure with hypoxia: Secondary | ICD-10-CM | POA: Diagnosis not present

## 2022-02-03 LAB — COMPREHENSIVE METABOLIC PANEL
ALT: 422 U/L — ABNORMAL HIGH (ref 0–44)
AST: 202 U/L — ABNORMAL HIGH (ref 15–41)
Albumin: 2.9 g/dL — ABNORMAL LOW (ref 3.5–5.0)
Alkaline Phosphatase: 97 U/L (ref 38–126)
Anion gap: 8 (ref 5–15)
BUN: 32 mg/dL — ABNORMAL HIGH (ref 8–23)
CO2: 32 mmol/L (ref 22–32)
Calcium: 8.4 mg/dL — ABNORMAL LOW (ref 8.9–10.3)
Chloride: 102 mmol/L (ref 98–111)
Creatinine, Ser: 1 mg/dL (ref 0.44–1.00)
GFR, Estimated: 59 mL/min — ABNORMAL LOW (ref >60)
Glucose, Bld: 128 mg/dL — ABNORMAL HIGH (ref 70–99)
Potassium: 3.4 mmol/L — ABNORMAL LOW (ref 3.5–5.1)
Sodium: 142 mmol/L (ref 135–145)
Total Bilirubin: 0.4 mg/dL (ref 0.3–1.2)
Total Protein: 6.6 g/dL (ref 6.5–8.1)

## 2022-02-03 LAB — CBC
HCT: 38.2 % (ref 36.0–46.0)
Hemoglobin: 11.5 g/dL — ABNORMAL LOW (ref 12.0–15.0)
MCH: 27.3 pg (ref 26.0–34.0)
MCHC: 30.1 g/dL (ref 30.0–36.0)
MCV: 90.5 fL (ref 80.0–100.0)
Platelets: 387 10*3/uL (ref 150–400)
RBC: 4.22 MIL/uL (ref 3.87–5.11)
RDW: 15 % (ref 11.5–15.5)
WBC: 19.3 10*3/uL — ABNORMAL HIGH (ref 4.0–10.5)
nRBC: 0 % (ref 0.0–0.2)

## 2022-02-03 LAB — MAGNESIUM: Magnesium: 3 mg/dL — ABNORMAL HIGH (ref 1.7–2.4)

## 2022-02-03 NOTE — Progress Notes (Signed)
1915 patient alert x 4 in chair at bedside on home computer on 7L [place]

## 2022-02-03 NOTE — Plan of Care (Signed)
  Problem: Activity: Goal: Ability to tolerate increased activity will improve Outcome: Progressing   Problem: Respiratory: Goal: Ability to maintain adequate ventilation will improve Outcome: Progressing Goal: Ability to maintain a clear airway will improve Outcome: Progressing   Problem: Education: Goal: Knowledge of General Education information will improve Description: Including pain rating scale, medication(s)/side effects and non-pharmacologic comfort measures Outcome: Progressing   Problem: Clinical Measurements: Goal: Ability to maintain clinical measurements within normal limits will improve Outcome: Progressing

## 2022-02-03 NOTE — Progress Notes (Signed)
Physical Therapy Treatment Patient Details Name: [name] MRN: [id number] DOB: 1945/06/17 Today's Date: 02/03/2022   History of Present Illness 76 year old F with PMH of COPD/chronic hypoxic RF on 2 to 3 L, NSCLC of left lung s/p chemoradiation currently on immunotherapy, paroxysmal A-fib, amyloid angiopathy of brain, hypothyroidism, hyperlipidemia and tobacco use disorder presented to to ED 01/28/22 with lightheadedness, respiratory failure with hypoxia with oxygen saturation in the range of 70 to 78%, required NRB    PT Comments    Progressing slowly. O2 93% on 7L at rest, 80% on 8L with ambulation.    Recommendations for follow up therapy are one component of a multi-disciplinary discharge planning process, led by the attending physician.  Recommendations may be updated based on patient status, additional functional criteria and insurance authorization.  Follow Up Recommendations  No PT follow up     Assistance Recommended at Discharge PRN  Patient can return home with the following A little help with bathing/dressing/bathroom;Help with stairs or ramp for entrance;Assist for transportation;Assistance with cooking/housework   Equipment Recommendations  None recommended by PT    Recommendations for Other Services       Precautions / Restrictions Precautions Precautions: Fall Precaution Comments: monitor O2 Restrictions Weight Bearing Restrictions: No     Mobility  Bed Mobility               General bed mobility comments: oob in recliner    Transfers Overall transfer level: Needs assistance   Transfers: Sit to/from Stand Sit to Stand: Supervision           General transfer comment: Supv-has some lightheadedness upon standing.    Ambulation/Gait Ambulation/Gait assistance: Min guard Gait Distance (Feet): 50 Feet (x2) Assistive device:  (hallway handrail) Gait Pattern/deviations: Step-through pattern, Decreased stride length       General  Gait Details: Min guard A for mobility. Intermittent unsteadiness. O2 80% on 8L, dyspnea 3/4. Standing rest break taken after ~50 feet.   Stairs             Wheelchair Mobility    Modified Rankin (Stroke Patients Only)       Balance Overall balance assessment: Needs assistance         Standing balance support: During functional activity Standing balance-Leahy Scale: Fair                              Cognition Arousal/Alertness: Awake/alert Behavior During Therapy: WFL for tasks assessed/performed Overall Cognitive Status: Within Functional Limits for tasks assessed                                          Exercises      General Comments        Pertinent Vitals/Pain Pain Assessment Pain Assessment: No/denies pain    Home Living                          Prior Function            PT Goals (current goals can now be found in the care plan section) Progress towards PT goals: Progressing toward goals    Frequency    Min 3X/week      PT Plan Current plan remains appropriate    Co-evaluation  AM-PAC PT "6 Clicks" Mobility   Outcome Measure  Help needed turning from your back to your side while in a flat bed without using bedrails?: None Help needed moving from lying on your back to sitting on the side of a flat bed without using bedrails?: None Help needed moving to and from a bed to a chair (including a wheelchair)?: None Help needed standing up from a chair using your arms (e.g., wheelchair or bedside chair)?: None Help needed to walk in hospital room?: A Little Help needed climbing 3-5 steps with a railing? : A Little 6 Click Score: 22    End of Session Equipment Utilized During Treatment: Gait belt;Oxygen Activity Tolerance: Patient limited by fatigue (limited by drop in O2 sats) Patient left: in chair;with call bell/phone within reach;with family/visitor present   PT Visit  Diagnosis: Difficulty in walking, not elsewhere classified (R26.2)     Time: 4268-3419 PT Time Calculation (min) (ACUTE ONLY): 17 min  Charges:  $Gait Training: 8-22 mins                         Doreatha Massed, PT Acute Rehabilitation  Office: [phone]

## 2022-02-03 NOTE — Progress Notes (Signed)
Progress Note   Patient: [name] [id number] DOB: 25-May-1945 DOA: 01/28/2022     6 DOS: the patient was seen and examined on 02/03/2022   Brief hospital course: 76 year old F with PMH of COPD/chronic hypoxic RF on 3.5L recently increased to 5L, NSCLC of left lung s/p chemoradiation currently on immunotherapy, paroxysmal A-fib not on AC due to amyloid angiopathy of brain, hypothyroidism, hyperlipidemia and tobacco use disorder presented to PCP with lightheadedness, wooziness and increased oxygen requirement for about 5 days, and sent to ED due to acute on chronic respiratory failure with hypoxia with oxygen saturation in the range of 70 to 78% on 5 L by [place].   In ED, she initially required NRB but weaned down to 10 L by HFNC later.  ABG with mild hypercarbia.  BNP and serial troponin negative.  CTA chest negative for PE but new GGO opacities with interlobular septal thickening throughout the right lung, persistent irregular appearing small left pleural effusion, progressive interstitial alveolar opacity throughout the LLL and slightly increased multiple mildly prominent mediastinal and right healer lymph nodes.  Patient was started on IV ceftriaxone and Solu-Medrol for possible pneumonia/immunotherapy related pneumonitis.  Oncology consulted and recommended CAP coverage and higher dose of steroid.   Patient continues to require 10 L by HFNC but has no respiratory distress.  Pulmonology consulted.  Assessment and Plan: Acute on chronic respiratory failure with hypoxia and hypercapnia:  On 3.5 L at baseline but lately required up to 5 L at home.  She has no respiratory distress but requiring 10 L to maintain saturation in upper 80s.  COVID-19, influenza and RSV PCR nonreactive.  Serial troponin and BNP are within normal.  Unfortunately, she continues to smoke about a pack a day.  CTA chest negative for PE but new GGO opacities with interlobular septal thickening throughout the right lung,  persistent irregular appearing small left pleural effusion, progressive interstitial alveolar opacity throughout the LLL and slightly increased multiple mildly prominent mediastinal and right healer lymph nodes.  She is at risk for pneumonia and/or pneumonitis with immunotherapy.  -Continue holding immunotherapy until outpatient follow-up with oncology. -Continue ceftriaxone, Solu-Medrol and Azithromycin. -Continue Brovana.  Change Incr[employer] Ellipta to [employer].  Add budesonide.  DuoNebs as needed. -Wean oxygen as able.  Minimum oxygen to keep saturation above 87%. -Continue home Lasix 20 mg daily -Incentive spirometry, OOB, PT/OT -Encouraged smoking cessation.  Nicotine patch ordered. -Consulted pulmonology. Concerns that this might be immunotherapy related or amiodarone related pneumonitis.  -Cardiology now on board, agrees with holding amiodarone -Pt breathing better today, down to 7L this AM   COPD GOLD III:  no respiratory distress other than increased oxygen requirement.  -Management as above.   Non-small cell lung cancer:follows with oncology, Dr. Curt Bears.  History of remote LUL lobectomy.  S/p recent chemoradiation now on active immunotherapy with Durvalumab. -Appreciate input by oncology-antibiotics and steroid as above, and hold immunotherapy   Paroxysmal atrial fibrillation: In sinus rhythm.  Not on anticoagulation due to concern for high bleeding risk in the setting of cerebral amyloid angiopathy. -Continue Toprol-XL and aspirin -Amiodarone now stopped, per above. Cardiology to see officially   History of CAD/CABG/stent: Stable. -Continue home medication   Elevated creatinine: AKI ruled out.  Stable. -Continue monitoring   Hypokalemia: Resolved.   Acquired hypothyroidism -Continue Synthroid.   Dyslipidemia -Continue rosuvastatin.   Leukocytosis/bandemia: Likely from steroid. -Monitor   Hyperglycemia: Likely from steroid.  Glucose within acceptable range.  6.2%  in 2022. -Monitor blood  glucose with daily labs. -Check hemoglobin A1c   Tobacco use disorder: Reports smoking about a pack a day. -Encouraged cessation   Physical deconditioning: -PT/OT   Goal of care discussion: Prefers to remain full code but does not want to have life prolonged on a ventilator if unsuccessful.  Designate her husband as Ambulance person if she is not able to   Morbid obesity Body mass index is 37.06 kg/m.       Subjective: States breathing better today  Physical Exam: Vitals:   02/03/22 0445 02/03/22 0811 02/03/22 0813 02/03/22 1342  BP: 137/74   124/73  Pulse: 66   76  Resp: 18   (!) 22  Temp: 97.6 F (36.4 C)   98 F (36.7 C)  TempSrc: Oral   Oral  SpO2: 91% 93% 93% 95%  Weight:      Height:       General exam: Conversant, in no acute distress Respiratory system: normal chest rise, clear, no audible wheezing Cardiovascular system: regular rhythm, s1-s2 Gastrointestinal system: Nondistended, nontender, pos BS Central nervous system: No seizures, no tremors Extremities: No cyanosis, no joint deformities Skin: No rashes, no pallor Psychiatry: Affect normal // no auditory hallucinations   Data Reviewed:  Labs reviewed: Na 142, K 3.4, Cr 1.00, WBC 19.3   Family Communication: Pt in room, family at bedside  Disposition: Status is: Inpatient Remains inpatient appropriate because: Severity of illness  Planned Discharge Destination: Home    Author: Marylu Lund, MD 02/03/2022 4:45 PM  For on call review [email or web address].

## 2022-02-03 NOTE — Progress Notes (Signed)
NAME:  [name], MRN:  [id number], DOB:  1945-11-02, LOS: 6 ADMISSION DATE:  01/28/2022, CONSULTATION DATE:  02/01/22 REFERRING MD:  Cyndia Skeeters - TRH, CHIEF COMPLAINT:  hypoxia  History of Present Illness:  76 yo F with Stg IIIa NSCLC s/p carboplatin and paclitaxel, and currently on immunotherapy Imfinzi q4wk, hx COPD, active tobacco use, chronic hypoxic respiratory failure on baseline 3-5L, Afib not on AC, cerebral amyloid angiopathy, who was admitted 12/11 for worse hypoxia, after having SpO2 70-80 on 5LNc at her PCP office earlier that day.  Admitted to [facility], CTA neg for PE but shows new GGOs, interlobular septal thickening, interstitial opacities. Started on azithro rocephin, steroids, lasix  Onc consulted 12/12, adding Possible immunotherapy related pneumonitis to ddx   PCCM consulted 12/15 for persistent hypoxia, on 10 HFNC at time of consultation  She was started on Imfinzi 8/31, status post 5 cycles  Pertinent  Medical History  Stg IIIa NSCLC  COPD Chronic hypoxia  Afib Amyloid angiopathy of brain  Significant Hospital Events: Including procedures, antibiotic start and stop dates in addition to other pertinent events   12/11 admit for hypoxia. Azithro rocephin, steroids, lasix 12/12 onc consult. Steroids increased  12/15 persistently elevated O2 req -- 10 HFNC, PCCM consulted  12/16 amiodarone stopped  Interim History / Subjective:   Appears improved. Down to 7 L nasal cannula. Decrease coughing  Objective   Blood pressure 137/74, pulse 66, temperature 97.6 F (36.4 C), temperature source Oral, resp. rate 18, height 5\' 3"  (1.6 m), weight 93.4 kg, SpO2 93 %.        Intake/Output Summary (Last 24 hours) at 02/03/2022 0947 Last data filed at 02/03/2022 0802 Gross per 24 hour  Intake 767.11 ml  Output 2775 ml  Net -2007.89 ml    Filed Weights   01/29/22 0440 02/02/22 0500 02/03/22 0434  Weight: 94.9 kg 95.2 kg 93.4 kg    Examination: General: well appearing  older adult woman sitting up in bed, on her laptop, able to speak in full sentences without dyspnea HENT: No JVD ED, mild pallor, no icterus Lungs: Scattered bibasal crackles, no accessory muscle use Cardiovascular: rr s1s2 Abdomen: round soft ndnt  Extremities: No edema Neuro: AAOx3    Labs show mild hypokalemia, stable leukocytosis and anemia, increased LFTs CT angiogram chest reviewed Chest x-ray 12/16 shows improved aeration of the right lung  Resolved Hospital Problem list     Assessment & Plan:   Acute on chronic hypoxic respiratory failure Stg IIIa NSCLC COPD  -Primary concern is that this might be immunotherapy related or amiodarone related pneumonitis.  Ddx atypical PNA, some pulm edema. CTA neg for PE.  She had minimal groundglass changes on the right on CT scan 8/19, likely increased in November and then flagrant in December Amiodarone was started 07/2021, immunotherapy first dose was 8/31   -RVP negative, urine strep antigen negative, urine Legionella negative  P -Decrease to Solu-Medrol 60 every 12, once she has clinical improvement with decreased FiO2 levels we can drop to 1 mg/kg of prednisone -cont rocephin azithro x 5 days total -IS, mobility -wean O2 as able, goal > 88% -brovana, budesonide, yupelri    Paroxysmal atrial fibrillation -stopped amiodarone 12/16 Cardiology will follow in A-fib clinic  Updated patient in detail today, explained that pneumonitis may be related to amiodarone rather than immunotherapy, this is a likelihood based on imaging and not definitive.  Risk of restarting immunotherapy would have to be weighed against benefits for cancer and  this would be discussed by oncology  Best Practice (right click and "Reselect all SmartList Selections" daily)   Per primary   Labs   CBC: Recent Labs  Lab 01/28/22 1554 01/28/22 1624 01/30/22 0408 01/31/22 0515 02/01/22 1239 02/02/22 0249 02/03/22 0240  WBC 10.9*   < > 19.6* 22.9* 20.6*  17.1* 19.3*  NEUTROABS 8.1*  --   --   --   --   --   --   HGB 11.3*   < > 10.9* 11.2* 11.8* 10.9* 11.5*  HCT 37.7   < > 36.7 37.0 39.6 36.1 38.2  MCV 91.3   < > 90.4 91.4 91.9 90.7 90.5  PLT 303   < > 365 418* 432* 374 387   < > = values in this interval not displayed.     Basic Metabolic Panel: Recent Labs  Lab 01/30/22 0408 01/31/22 0515 02/01/22 1239 02/02/22 0249 02/03/22 0240  NA 142 144 142 145 142  K 4.8 3.9 3.5 3.9 3.4*  CL 104 105 103 103 102  CO2 31 30 31  32 32  GLUCOSE 148* 121* 209* 123* 128*  BUN 19 25* 22 25* 32*  CREATININE 1.07* 1.10* 0.83 1.00 1.00  CALCIUM 9.3 9.0 8.6* 8.4* 8.4*  MG 2.4 2.6* 2.6* 2.8* 3.0*  PHOS 4.0 4.3 2.7 4.1  --     GFR: Estimated Creatinine Clearance: 52.8 mL/min (by C-G formula based on SCr of 1 mg/dL). Recent Labs  Lab 01/31/22 0515 02/01/22 1239 02/02/22 0249 02/03/22 0240  WBC 22.9* 20.6* 17.1* 19.3*     Liver Function Tests: Recent Labs  Lab 01/28/22 1554 01/30/22 0408 01/31/22 0515 02/01/22 1239 02/02/22 0249 02/03/22 0240  AST 76*  --   --   --   --  202*  ALT 95*  --   --   --   --  422*  ALKPHOS 80  --   --   --   --  97  BILITOT 0.4  --   --   --   --  0.4  PROT 7.5  --   --   --   --  6.6  ALBUMIN 3.1* 2.6* 2.6* 2.9* 2.7* 2.9*    No results for input(s): "LIPASE", "AMYLASE" in the last 168 hours. No results for input(s): "AMMONIA" in the last 168 hours.  ABG    Component Value Date/Time   PHART 7.48 (H) 01/28/2022 1544   PCO2ART 54 (H) 01/28/2022 1544   PO2ART 122 (H) 01/28/2022 1544   HCO3 40.2 (H) 01/28/2022 1544   TCO2 33 (H) 01/28/2022 1624   ACIDBASEDEF 1.0 07/07/2010 1023   O2SAT 100 01/28/2022 1544     Coagulation Profile: No results for input(s): "INR", "PROTIME" in the last 168 hours.  Cardiac Enzymes: No results for input(s): "CKTOTAL", "CKMB", "CKMBINDEX", "TROPONINI" in the last 168 hours.  HbA1C: Hgb A1c MFr Bld  Date/Time Value Ref Range Status  10/25/2020 09:35 AM 6.2  (H) 4.8 - 5.6 % Final    Comment:             Prediabetes: 5.7 - 6.4          Diabetes: >6.4          Glycemic control for adults with diabetes: <7.0   06/14/2019 10:52 AM 6.1 (H) 4.8 - 5.6 % Final    Comment:             Prediabetes: 5.7 - 6.4  Diabetes: >6.4          Glycemic control for adults with diabetes: <7.0     CBG: No results for input(s): "GLUCAP" in the last 168 hours. 02/03/2022, 9:47 AM   Kara Mead MD. FCCP. [facility] Pager : [phone]  If no response to pager , please call [phone] until 7 pm After 7:00 pm call Elink  [phone]   02/03/2022

## 2022-02-03 NOTE — Consult Note (Signed)
Cardiology Consultation   Patient ID: [name] MRN: [id number]; DOB: 1945-11-26  Admit date: 01/28/2022 Date of Consult: 02/03/2022  PCP:  [name], NP   [place] [facility] Cardiologist:  Shelva Majestic, MD        Patient Profile:   [name] is a 76 y.o. female with a hx of CAD status post CABG in 2012, ischemic cardiomyopathy (EF 35% in 2012, improved to 40 to 55% 10/2020) NSCLC, COPD, tobacco use, chronic respiratory failure on 3L, paroxysmal atrial fibrillation not on anticoagulation, cerebral amyloid angiopathy who is being seen 02/03/2022 for the evaluation of atrial fibrillation at the request of Dr Wyline Copas.  History of Present Illness:   [name] is a 76 year old female with history of CAD status post CABG in 2012, ischemic cardiomyopathy (EF 35% in 2012, improved to 50 to 55% 10/2020) NSCLC, COPD, tobacco use, chronic respiratory failure on 3L, paroxysmal atrial fibrillation not on anticoagulation, cerebral amyloid angiopathy who we are consulted for evaluation of atrial fibrillation.  She was admitted 01/28/2022 with worsening hypoxia, noted to have SpO2 down to 70s on 5 L at PCP office.  CTPA showed no PE but showed new groundglass opacities and interstitial opacities.  She was started on antibiotics, steroids, and Lasix.  PCCM was consulted, thought to represent either immunotherapy or amiodarone related pneumonitis.  She is started on high-dose Solu-Medrol and Rocephin/azithromycin.  During admission 07/2021 she was found to have paroxysmal atrial flutter, started on amiodarone at that time.  Anticoagulation was deferred given concern for amyloid angiopathy on brain MRI with evidence of chronic peripheral microhemorrhage.  Goal has been to maintain sinus rhythm.  She was loaded with amiodarone and has been on 200 mg daily.   Past Medical History:  Diagnosis Date   CAD (coronary artery disease)    Cancer ([place])    COPD (chronic obstructive  pulmonary disease) ([place])    per DM note, pt denies   Dyslipidemia    GERD (gastroesophageal reflux disease)    History of radiation therapy    Left lung- 01/16/21-01/30/21- Dr. Gery Pray   History of radiation therapy    Left lung- 08/02/21-09/18/21- Dr. Gery Pray   HTN (hypertension)    Hypothyroidism    Myocardial infarct [facility])    Non-small cell lung cancer ([place]) 08/19/2010   Stage IA, status post left upper lobectomy July 2012   Tobacco abuse     Past Surgical History:  Procedure Laterality Date   CARDIAC CATHETERIZATION  07/06/2010   see CABG report - pt sent to OR   CARDIOVASCULAR STRESS TEST  09/11/2010   R/S MV - normal perfusion in all regions, EF 46%, no scintigraphic evidence of inducible myocardial ischemia; global LV systolic function mildly reduced; no significant wall motion abnormalities noted; Exercise capacity 7 METS; EKG negative for ischemia; low risk study, no signifcant change from previous study 08/2003   CORONARY ARTERY BYPASS GRAFT  07/11/2010   LIMA to LAD; SVG to 2nd branch OM; SVG to posterior descending artery   IR IMAGING GUIDED PORT INSERTION  08/16/2021   LEFT VATS  09/17/2010   TEE WITHOUT CARDIOVERSION  07/06/2010   during emergent CABG surgery; 2-3+ mitral regurgitation   VIDEO BRONCHOSCOPY N/A 07/27/2021   Procedure: VIDEO BRONCHOSCOPY;  Surgeon: Melrose Nakayama, MD;  Location[facility] OR;  Service: Thoracic;  Laterality: N/A;   VIDEO BRONCHOSCOPY WITH ENDOBRONCHIAL ULTRASOUND N/A 07/27/2021   Procedure: VIDEO BRONCHOSCOPY WITH ENDOBRONCHIAL ULTRASOUND;  Surgeon: Modesto Charon  C, MD;  Location: [facility] OR;  Service: Thoracic;  Laterality: N/A;      Inpatient Medications: Scheduled Meds:  arformoterol  15 mcg Nebulization BID   aspirin  81 mg Oral QHS   budesonide (PULMICORT) nebulizer solution  0.5 mg Nebulization BID   Chlorhexidine Gluconate Cloth  6 each Topical Daily   enoxaparin (LOVENOX) injection  40 mg Subcutaneous Q24H   furosemide   40 mg Intravenous Daily   levothyroxine  12.5 mcg Oral Once per day on Mon Tue Wed Thu Fri Sat   levothyroxine  50 mcg Oral Q0600   methylPREDNISolone (SOLU-MEDROL) injection  80 mg Intravenous Q12H   metoprolol succinate  100 mg Oral Daily   nicotine  21 mg Transdermal Daily   mouth rinse  15 mL Mouth Rinse 4 times per day   potassium chloride  10 mEq Oral QODAY   revefenacin  175 mcg Nebulization Daily   rosuvastatin  40 mg Oral QHS   sodium chloride flush  3 mL Intravenous Q12H   Continuous Infusions:  azithromycin (ZITHROMAX) 500 mg in sodium chloride 0.9 % 250 mL IVPB Stopped (02/02/22 2208)   cefTRIAXone (ROCEPHIN)  IV Stopped (02/02/22 2059)   PRN Meds: acetaminophen **OR** acetaminophen, guaiFENesin-dextromethorphan, ipratropium-albuterol, lip balm, ondansetron **OR** ondansetron (ZOFRAN) IV, mouth rinse, senna-docusate, sodium chloride flush  Allergies:    Allergies  Allergen Reactions   Codeine Other (See Comments)    Unknown reaction Patient avoids this medication    Social History:   Social History   Socioeconomic History   Marital status: Married    Spouse name: Not on file   Number of children: Not on file   Years of education: Not on file   Highest education level: Not on file  Occupational History   Not on file  Tobacco Use   Smoking status: Former    Packs/day: 1.00    Years: 58.00    Total pack years: 58.00    Types: Cigarettes    Quit date: 07/19/2021    Years since quitting: 0.5   Smokeless tobacco: Former   Tobacco comments:    Pt states she is currently smoking  Vaping Use   Vaping Use: Never used  Substance and Sexual Activity   Alcohol use: No    Alcohol/week: 0.0 standard drinks of alcohol   Drug use: No   Sexual activity: Never  Other Topics Concern   Not on file  Social History Narrative   Not on file   Social Determinants of Health   Financial Resource Strain: Low Risk  (01/28/2022)   Overall Financial Resource Strain (CARDIA)     Difficulty of Paying Living Expenses: Not hard at all  Food Insecurity: No Food Insecurity (01/28/2022)   Hunger Vital Sign    Worried About Running Out of Food in the Last Year: Never true    [place] in the Last Year: Never true  Transportation Needs: No Transportation Needs (01/28/2022)   PRAPARE - Hydrologist (Medical): No    Lack of Transportation (Non-Medical): No  Physical Activity: Inactive (01/28/2022)   Exercise Vital Sign    Days of Exercise per Week: 0 days    Minutes of Exercise per Session: 0 min  Stress: No Stress Concern Present (01/28/2022)   [place]    Feeling of Stress : Not at all  Social Connections: Socially Isolated (01/28/2022)   Social Connection and Isolation Panel [  NHANES]    Frequency of Communication with Friends and Family: Once a week    Frequency of Social Gatherings with Friends and Family: Once a week    Attends Religious Services: Never    Marine scientist or Organizations: No    Attends Archivist Meetings: Never    Marital Status: Married  Human resources officer Violence: Not At Risk (01/28/2022)   Humiliation, Afraid, Rape, and Kick questionnaire    Fear of Current or Ex-Partner: No    Emotionally Abused: No    Physically Abused: No    Sexually Abused: No    Family History:    Family History  Problem Relation Age of Onset   Hypothyroidism Father    Heart attack Father    Aplastic anemia Maternal Grandfather    Stroke Paternal Grandfather      ROS:  Please see the history of present illness.   All other ROS reviewed and negative.     Physical Exam/Data:   Vitals:   02/03/22 0434 02/03/22 0445 02/03/22 0811 02/03/22 0813  BP:  137/74    Pulse:  66    Resp:  18    Temp:  97.6 F (36.4 C)    TempSrc:  Oral    SpO2:  91% 93% 93%  Weight: 93.4 kg     Height:        Intake/Output Summary (Last 24 hours) at  02/03/2022 0935 Last data filed at 02/03/2022 0802 Gross per 24 hour  Intake 767.11 ml  Output 2775 ml  Net -2007.89 ml      02/03/2022    4:34 AM 02/02/2022    5:00 AM 01/29/2022    4:40 AM  Last 3 Weights  Weight (lbs) 205 lb 14.4 oz 209 lb 14.1 oz 209 lb 3.5 oz  Weight (kg) 93.396 kg 95.2 kg 94.9 kg     Body mass index is 36.47 kg/m.  General:   in no acute distress HEENT: normal Neck: no JVD Cardiac:  normal S1, S2; RRR; 2/6 systolic murmur Lungs:  bibasilar crackles Abd: soft, nontender Ext: no edema Musculoskeletal:  No deformities, BUE and BLE strength normal and equal Skin: warm and dry  Neuro:  no focal abnormalities noted Psych:  Normal affect   EKG:  The EKG was personally reviewed and demonstrates:  NSR, rate 85, nonspecific T wave flattening Telemetry:  Telemetry was personally reviewed and demonstrates:  NSR  Relevant CV Studies:   Laboratory Data:  High Sensitivity Troponin:   Recent Labs  Lab 01/28/22 1554 01/28/22 1857  TROPONINIHS 4 6     Chemistry Recent Labs  Lab 02/01/22 1239 02/02/22 0249 02/03/22 0240  NA 142 145 142  K 3.5 3.9 3.4*  CL 103 103 102  CO2 31 32 32  GLUCOSE 209* 123* 128*  BUN 22 25* 32*  CREATININE 0.83 1.00 1.00  CALCIUM 8.6* 8.4* 8.4*  MG 2.6* 2.8* 3.0*  GFRNONAA >60 59* 59*  ANIONGAP 8 10 8     Recent Labs  Lab 01/28/22 1554 01/30/22 0408 02/01/22 1239 02/02/22 0249 02/03/22 0240  PROT 7.5  --   --   --  6.6  ALBUMIN 3.1*   < > 2.9* 2.7* 2.9*  AST 76*  --   --   --  202*  ALT 95*  --   --   --  422*  ALKPHOS 80  --   --   --  97  BILITOT 0.4  --   --   --  0.4   < > = values in this interval not displayed.   Lipids No results for input(s): "CHOL", "TRIG", "HDL", "LABVLDL", "LDLCALC", "CHOLHDL" in the last 168 hours.  Hematology Recent Labs  Lab 02/01/22 1239 02/02/22 0249 02/03/22 0240  WBC 20.6* 17.1* 19.3*  RBC 4.31 3.98 4.22  HGB 11.8* 10.9* 11.5*  HCT 39.6 36.1 38.2  MCV 91.9 90.7 90.5   MCH 27.4 27.4 27.3  MCHC 29.8* 30.2 30.1  RDW 15.0 15.1 15.0  PLT 432* 374 387   Thyroid No results for input(s): "TSH", "FREET4" in the last 168 hours.  BNP Recent Labs  Lab 01/28/22 1544 02/01/22 1239  BNP 32.2 252.8*    DDimer No results for input(s): "DDIMER" in the last 168 hours.   Radiology/Studies:  DG CHEST PORT 1 VIEW  Result Date: 02/02/2022 CLINICAL DATA:  Respiratory failure EXAM: PORTABLE CHEST 1 VIEW COMPARISON:  01/28/2022 chest x-ray and CT, chest x-ray 08/07/2021 FINDINGS: Right-sided central venous port tip over the SVC. Post sternotomy changes. Postsurgical changes on the left. Pleural effusion and airspace disease are grossly unchanged. Improved aeration of right thorax compared to prior. Enlarged cardiomediastinal silhouette IMPRESSION: Improved aeration of the right thorax compared to prior. Pleural effusion and airspace disease on the left are grossly unchanged. Electronically Signed   By: Donavan Foil M.D.   On: 02/02/2022 17:07     Assessment and Plan:   Paroxysmal atrial fibrillation/flutter: During admission 07/2021 she was found to have paroxysmal atrial flutter, started on amiodarone at that time.  Anticoagulation was deferred given concern for amyloid angiopathy on brain MRI with evidence of chronic peripheral microhemorrhage.  Goal has been to maintain sinus rhythm.  She was loaded with amiodarone and has been on 200 mg daily.  Now presenting with concern for possible amiodarone related pneumonitis vs immunotherapy related.  She is in sinus rhythm -Discontinue amiodarone.  Amiodarone has a long half-life, about 60 days.  Will plan to follow-up in A-fib clinic as outpatient to consider alternative antiarrhythmic to maintain sinus rhythm given she is not on anticoagulation due to amyloid angiopathy on brain MRI -Continue metoprolol 100 mg daily  Acute on chronic hypoxic respiratory failure: Thought to be due to pneumonitis from either amiodarone or  immunotherapy.  PCCM following, on IV Solu-Medrol and antibiotics  For questions or updates, please contact [place] Please consult [email or web address] for contact info under    Signed, Donato Heinz, MD  02/03/2022 9:35 AM

## 2022-02-04 ENCOUNTER — Inpatient Hospital Stay (HOSPITAL_COMMUNITY): Payer: Medicare HMO

## 2022-02-04 DIAGNOSIS — J9622 Acute and chronic respiratory failure with hypercapnia: Secondary | ICD-10-CM | POA: Diagnosis not present

## 2022-02-04 DIAGNOSIS — T50905A Adverse effect of unspecified drugs, medicaments and biological substances, initial encounter: Secondary | ICD-10-CM

## 2022-02-04 DIAGNOSIS — J9621 Acute and chronic respiratory failure with hypoxia: Secondary | ICD-10-CM | POA: Diagnosis not present

## 2022-02-04 DIAGNOSIS — J984 Other disorders of lung: Secondary | ICD-10-CM | POA: Diagnosis not present

## 2022-02-04 LAB — CBC
HCT: 36.6 % (ref 36.0–46.0)
Hemoglobin: 11.1 g/dL — ABNORMAL LOW (ref 12.0–15.0)
MCH: 27.5 pg (ref 26.0–34.0)
MCHC: 30.3 g/dL (ref 30.0–36.0)
MCV: 90.8 fL (ref 80.0–100.0)
Platelets: 329 10*3/uL (ref 150–400)
RBC: 4.03 MIL/uL (ref 3.87–5.11)
RDW: 15.3 % (ref 11.5–15.5)
WBC: 17.9 10*3/uL — ABNORMAL HIGH (ref 4.0–10.5)
nRBC: 0 % (ref 0.0–0.2)

## 2022-02-04 LAB — COMPREHENSIVE METABOLIC PANEL
ALT: 366 U/L — ABNORMAL HIGH (ref 0–44)
AST: 134 U/L — ABNORMAL HIGH (ref 15–41)
Albumin: 2.8 g/dL — ABNORMAL LOW (ref 3.5–5.0)
Alkaline Phosphatase: 81 U/L (ref 38–126)
Anion gap: 7 (ref 5–15)
BUN: 30 mg/dL — ABNORMAL HIGH (ref 8–23)
CO2: 33 mmol/L — ABNORMAL HIGH (ref 22–32)
Calcium: 8.3 mg/dL — ABNORMAL LOW (ref 8.9–10.3)
Chloride: 101 mmol/L (ref 98–111)
Creatinine, Ser: 0.98 mg/dL (ref 0.44–1.00)
GFR, Estimated: >60 mL/min (ref >60)
Glucose, Bld: 144 mg/dL — ABNORMAL HIGH (ref 70–99)
Potassium: 4 mmol/L (ref 3.5–5.1)
Sodium: 141 mmol/L (ref 135–145)
Total Bilirubin: 0.4 mg/dL (ref 0.3–1.2)
Total Protein: 6.2 g/dL — ABNORMAL LOW (ref 6.5–8.1)

## 2022-02-04 LAB — HEMOGLOBIN A1C
Hgb A1c MFr Bld: 6.4 % — ABNORMAL HIGH (ref 4.8–5.6)
Mean Plasma Glucose: 137 mg/dL

## 2022-02-04 MED ORDER — ROSUVASTATIN CALCIUM 20 MG PO TABS
40.0000 mg | ORAL_TABLET | Freq: Every day | ORAL | Status: DC
  Administered 2022-02-04: 40 mg via ORAL
  Filled 2022-02-04: qty 2

## 2022-02-04 MED ORDER — METHYLPREDNISOLONE SODIUM SUCC 125 MG IJ SOLR
60.0000 mg | Freq: Two times a day (BID) | INTRAMUSCULAR | Status: DC
  Administered 2022-02-04 – 2022-02-06 (×4): 60 mg via INTRAVENOUS
  Filled 2022-02-04 (×5): qty 2

## 2022-02-04 NOTE — Care Management Important Message (Signed)
Important Message  Patient Details  Name: [name] MRN: [id number] Date of Birth: 16-Dec-1945   Medicare Important Message Given:  Yes     Memory Argue 02/04/2022, 3:06 PM

## 2022-02-04 NOTE — Progress Notes (Signed)
Progress Note   Patient: [name] XBW:[id number] DOB: 1945-06-22 DOA: 01/28/2022     7 DOS: the patient was seen and examined on 02/04/2022   Brief hospital course: 76 year old F with PMH of COPD/chronic hypoxic RF on 3.5L recently increased to 5L, NSCLC of left lung s/p chemoradiation currently on immunotherapy, paroxysmal A-fib not on AC due to amyloid angiopathy of brain, hypothyroidism, hyperlipidemia and tobacco use disorder presented to PCP with lightheadedness, wooziness and increased oxygen requirement for about 5 days, and sent to ED due to acute on chronic respiratory failure with hypoxia with oxygen saturation in the range of 70 to 78% on 5 L by [place].   In ED, she initially required NRB but weaned down to 10 L by HFNC later.  ABG with mild hypercarbia.  BNP and serial troponin negative.  CTA chest negative for PE but new GGO opacities with interlobular septal thickening throughout the right lung, persistent irregular appearing small left pleural effusion, progressive interstitial alveolar opacity throughout the LLL and slightly increased multiple mildly prominent mediastinal and right healer lymph nodes.  Patient was started on IV ceftriaxone and Solu-Medrol for possible pneumonia/immunotherapy related pneumonitis.  Oncology consulted and recommended CAP coverage and higher dose of steroid.   Patient continues to require 10 L by HFNC but has no respiratory distress.  Pulmonology consulted.  Assessment and Plan: Acute on chronic respiratory failure with hypoxia and hypercapnia:  On 3.5 L at baseline but lately required up to 5 L at home.  She has no respiratory distress but requiring 10 L to maintain saturation in upper 80s.  COVID-19, influenza and RSV PCR nonreactive.  Serial troponin and BNP are within normal.  Unfortunately, she continues to smoke about a pack a day.  CTA chest negative for PE but new GGO opacities with interlobular septal thickening throughout the right lung,  persistent irregular appearing small left pleural effusion, progressive interstitial alveolar opacity throughout the LLL and slightly increased multiple mildly prominent mediastinal and right healer lymph nodes.  She is at risk for pneumonia and/or pneumonitis with immunotherapy.  -Continue holding immunotherapy until outpatient follow-up with oncology. -Continue ceftriaxone, Solu-Medrol and Azithromycin. -Continue Brovana.  Change Incruse Ellipta to [employer].  Add budesonide.  DuoNebs as needed. -Wean oxygen as able.  Minimum oxygen to keep saturation above 87%. -Continue home Lasix 20 mg daily -Incentive spirometry, OOB, PT/OT -Encouraged smoking cessation.  Nicotine patch ordered. -Consulted pulmonology. Concerns that this might be immunotherapy related or amiodarone related pneumonitis.  -Cardiology now on board, agrees with holding amiodarone -Pt reports breathing well today, currently remains on 7L. Per Pulmonary, would not discharge if still needing over 5L   COPD GOLD III:  no respiratory distress other than increased oxygen requirement.  -Management as above.   Non-small cell lung cancer:follows with oncology, Dr. Curt Bears.  History of remote LUL lobectomy.  S/p recent chemoradiation now on active immunotherapy with Durvalumab. -Appreciate input by oncology-antibiotics and steroid as above, and hold immunotherapy   Paroxysmal atrial fibrillation: In sinus rhythm.  Not on anticoagulation due to concern for high bleeding risk in the setting of cerebral amyloid angiopathy. -Continue Toprol-XL and aspirin -Amiodarone now stopped, per above. Cardiology to follow as outpatient   History of CAD/CABG/stent: Stable. -Continue home medication   Elevated creatinine: AKI ruled out.  Stable. -Continue monitoring   Hypokalemia: Resolved.   Acquired hypothyroidism -Continue Synthroid.   Dyslipidemia -Continue rosuvastatin.   Leukocytosis/bandemia: Likely from steroid. -Monitor    Hyperglycemia: Likely from steroid.  Glucose within acceptable range.  6.2% in 2022. -Monitor blood glucose with daily labs. -Check hemoglobin A1c   Tobacco use disorder: Reports smoking about a pack a day. -Encouraged cessation   Physical deconditioning: -PT/OT   Goal of care discussion: Prefers to remain full code but does not want to have life prolonged on a ventilator if unsuccessful.  Designate her husband as Ambulance person if she is not able to   Morbid obesity Body mass index is 37.06 kg/m.       Subjective: States breathing better today  Physical Exam: Vitals:   02/04/22 0402 02/04/22 0406 02/04/22 0906 02/04/22 1518  BP: 134/66   136/79  Pulse: 61   70  Resp: 18   16  Temp: (!) 97.5 F (36.4 C)   98.9 F (37.2 C)  TempSrc: Oral     SpO2: 94%  91% 94%  Weight:  92.6 kg    Height:       General exam: Conversant, in no acute distress Respiratory system: normal chest rise, clear, no audible wheezing Cardiovascular system: regular rhythm, s1-s2 Gastrointestinal system: Nondistended, nontender, pos BS Central nervous system: No seizures, no tremors Extremities: No cyanosis, no joint deformities Skin: No rashes, no pallor Psychiatry: Affect normal // no auditory hallucinations   Data Reviewed:  Labs reviewed: Na 141, K 4.0, Cr 0.98, WBC 17.9   Family Communication: Pt in room, family at bedside  Disposition: Status is: Inpatient Remains inpatient appropriate because: Severity of illness  Planned Discharge Destination: Home    Author: Marylu Lund, MD 02/04/2022 6:18 PM  For on call review [email or web address].

## 2022-02-04 NOTE — Progress Notes (Signed)
NAME:  [name], MRN:  [id number], DOB:  08-13-1945, LOS: 7 ADMISSION DATE:  01/28/2022, CONSULTATION DATE:  02/01/22 REFERRING MD:  Cyndia Skeeters - TRH, CHIEF COMPLAINT:  hypoxia  History of Present Illness:  76 yo F with Stg IIIa NSCLC s/p carboplatin and paclitaxel, and currently on immunotherapy Imfinzi q4wk, hx COPD, active tobacco use, chronic hypoxic respiratory failure on baseline 3-5L, Afib not on AC, cerebral amyloid angiopathy, who was admitted 12/11 for worse hypoxia, after having SpO2 70-80 on 5LNc at her PCP office earlier that day.  Admitted to [facility], CTA neg for PE but shows new GGOs, interlobular septal thickening, interstitial opacities. Started on azithro rocephin, steroids, lasix  Onc consulted 12/12, adding Possible immunotherapy related pneumonitis to ddx   PCCM consulted 12/15 for persistent hypoxia, on 10 HFNC at time of consultation  She was started on Imfinzi 8/31, status post 5 cycles  Pertinent  Medical History  Stg IIIa NSCLC  COPD Chronic hypoxia  Afib Amyloid angiopathy of brain  Significant Hospital Events: Including procedures, antibiotic start and stop dates in addition to other pertinent events   12/11 admit for hypoxia. Azithro rocephin, steroids, lasix 12/12 onc consult. Steroids increased  12/15 persistently elevated O2 req -- 10 HFNC, PCCM consulted  12/16 amiodarone stopped  Interim History / Subjective:   Breathing unchanged. Remains on 7 L nasal cannula. Out of bed to chair  Objective   Blood pressure 134/66, pulse 61, temperature (!) 97.5 F (36.4 C), temperature source Oral, resp. rate 18, height 5\' 3"  (1.6 m), weight 92.6 kg, SpO2 91 %.        Intake/Output Summary (Last 24 hours) at 02/04/2022 1133 Last data filed at 02/04/2022 1130 Gross per 24 hour  Intake 215.53 ml  Output 2250 ml  Net -2034.47 ml    Filed Weights   02/02/22 0500 02/03/22 0434 02/04/22 0406  Weight: 95.2 kg 93.4 kg 92.6 kg    Examination: General:  well appearing older adult woman out of bed to chair, able to speak in full sentences without dyspnea HENT: No JVD ED, mild pallor, no icterus Lungs: No accessory muscle use, bibasal fine crackles Cardiovascular: rr s1s2 Abdomen: round soft ndnt  Extremities: No edema Neuro: AAOx3    Labs show normal electrolytes, decreasing LFTs, decreasing leukocytosis CT angiogram chest reviewed Chest x-ray 12/16 shows improved aeration of the right lung  Resolved Hospital Problem list     Assessment & Plan:   Acute on chronic hypoxic respiratory failure Stg IIIa NSCLC COPD  -Primary concern is that this might be immunotherapy related or amiodarone related pneumonitis. Amiodarone was started 07/2021, immunotherapy first dose was 8/31 Ddx atypical PNA, some pulm edema. CTA neg for PE.  She had minimal groundglass changes on the right on CT scan 8/19, likely increased in November and then flagrant in December   -RVP negative, urine strep antigen negative, urine Legionella negative  P -Decrease to Solu-Medrol 60 every 12, once she has clinical improvement with decreased FiO2 levels we can drop to 1 mg/kg of prednisone -cont rocephin azithro x 5 days total -IS, mobility -wean O2 as able, goal > 88% -brovana, budesonide, yupelri  -Consider discharge only when oxygen requirements decreased to 5 L or lower   Paroxysmal atrial fibrillation -stopped amiodarone 12/16 Discussed with cardiology , they will follow in A-fib clinic  Updated patient in detail today, explained that pneumonitis may be related to amiodarone rather than immunotherapy, this is a likelihood based on imaging and not definitive.  Risk of restarting immunotherapy would have to be weighed against benefits for cancer and this would be discussed by oncology  PCCM will be available as needed  Best Practice (right click and "Reselect all SmartList Selections" daily)   Per primary   Labs   CBC: Recent Labs  Lab 01/28/22 1554  01/28/22 1624 01/31/22 0515 02/01/22 1239 02/02/22 0249 02/03/22 0240 02/04/22 0255  WBC 10.9*   < > 22.9* 20.6* 17.1* 19.3* 17.9*  NEUTROABS 8.1*  --   --   --   --   --   --   HGB 11.3*   < > 11.2* 11.8* 10.9* 11.5* 11.1*  HCT 37.7   < > 37.0 39.6 36.1 38.2 36.6  MCV 91.3   < > 91.4 91.9 90.7 90.5 90.8  PLT 303   < > 418* 432* 374 387 329   < > = values in this interval not displayed.     Basic Metabolic Panel: Recent Labs  Lab 01/30/22 0408 01/31/22 0515 02/01/22 1239 02/02/22 0249 02/03/22 0240 02/04/22 0255  NA 142 144 142 145 142 141  K 4.8 3.9 3.5 3.9 3.4* 4.0  CL 104 105 103 103 102 101  CO2 31 30 31  32 32 33*  GLUCOSE 148* 121* 209* 123* 128* 144*  BUN 19 25* 22 25* 32* 30*  CREATININE 1.07* 1.10* 0.83 1.00 1.00 0.98  CALCIUM 9.3 9.0 8.6* 8.4* 8.4* 8.3*  MG 2.4 2.6* 2.6* 2.8* 3.0*  --   PHOS 4.0 4.3 2.7 4.1  --   --     GFR: Estimated Creatinine Clearance: 53.6 mL/min (by C-G formula based on SCr of 0.98 mg/dL). Recent Labs  Lab 02/01/22 1239 02/02/22 0249 02/03/22 0240 02/04/22 0255  WBC 20.6* 17.1* 19.3* 17.9*     Liver Function Tests: Recent Labs  Lab 01/28/22 1554 01/30/22 0408 01/31/22 0515 02/01/22 1239 02/02/22 0249 02/03/22 0240 02/04/22 0255  AST 76*  --   --   --   --  202* 134*  ALT 95*  --   --   --   --  422* 366*  ALKPHOS 80  --   --   --   --  97 81  BILITOT 0.4  --   --   --   --  0.4 0.4  PROT 7.5  --   --   --   --  6.6 6.2*  ALBUMIN 3.1*   < > 2.6* 2.9* 2.7* 2.9* 2.8*   < > = values in this interval not displayed.    No results for input(s): "LIPASE", "AMYLASE" in the last 168 hours. No results for input(s): "AMMONIA" in the last 168 hours.  ABG    Component Value Date/Time   PHART 7.48 (H) 01/28/2022 1544   PCO2ART 54 (H) 01/28/2022 1544   PO2ART 122 (H) 01/28/2022 1544   HCO3 40.2 (H) 01/28/2022 1544   TCO2 33 (H) 01/28/2022 1624   ACIDBASEDEF 1.0 07/07/2010 1023   O2SAT 100 01/28/2022 1544      Coagulation Profile: No results for input(s): "INR", "PROTIME" in the last 168 hours.  Cardiac Enzymes: No results for input(s): "CKTOTAL", "CKMB", "CKMBINDEX", "TROPONINI" in the last 168 hours.  HbA1C: Hgb A1c MFr Bld  Date/Time Value Ref Range Status  10/25/2020 09:35 AM 6.2 (H) 4.8 - 5.6 % Final    Comment:             Prediabetes: 5.7 - 6.4  Diabetes: >6.4          Glycemic control for adults with diabetes: <7.0   06/14/2019 10:52 AM 6.1 (H) 4.8 - 5.6 % Final    Comment:             Prediabetes: 5.7 - 6.4          Diabetes: >6.4          Glycemic control for adults with diabetes: <7.0     CBG: No results for input(s): "GLUCAP" in the last 168 hours. 02/04/2022, 11:33 AM   Kara Mead MD. FCCP. [facility] Pager : [phone]  If no response to pager , please call [phone] until 7 pm After 7:00 pm call Elink  [phone]   02/04/2022

## 2022-02-05 DIAGNOSIS — J9621 Acute and chronic respiratory failure with hypoxia: Secondary | ICD-10-CM | POA: Diagnosis not present

## 2022-02-05 DIAGNOSIS — J9622 Acute and chronic respiratory failure with hypercapnia: Secondary | ICD-10-CM | POA: Diagnosis not present

## 2022-02-05 LAB — CBC
HCT: 37.8 % (ref 36.0–46.0)
Hemoglobin: 11.4 g/dL — ABNORMAL LOW (ref 12.0–15.0)
MCH: 27.1 pg (ref 26.0–34.0)
MCHC: 30.2 g/dL (ref 30.0–36.0)
MCV: 90 fL (ref 80.0–100.0)
Platelets: 337 10*3/uL (ref 150–400)
RBC: 4.2 MIL/uL (ref 3.87–5.11)
RDW: 15.5 % (ref 11.5–15.5)
WBC: 19.6 10*3/uL — ABNORMAL HIGH (ref 4.0–10.5)
nRBC: 0 % (ref 0.0–0.2)

## 2022-02-05 LAB — COMPREHENSIVE METABOLIC PANEL
ALT: 335 U/L — ABNORMAL HIGH (ref 0–44)
AST: 116 U/L — ABNORMAL HIGH (ref 15–41)
Albumin: 2.5 g/dL — ABNORMAL LOW (ref 3.5–5.0)
Alkaline Phosphatase: 73 U/L (ref 38–126)
Anion gap: 7 (ref 5–15)
BUN: 36 mg/dL — ABNORMAL HIGH (ref 8–23)
CO2: 32 mmol/L (ref 22–32)
Calcium: 8.4 mg/dL — ABNORMAL LOW (ref 8.9–10.3)
Chloride: 101 mmol/L (ref 98–111)
Creatinine, Ser: 0.97 mg/dL (ref 0.44–1.00)
GFR, Estimated: >60 mL/min (ref >60)
Glucose, Bld: 115 mg/dL — ABNORMAL HIGH (ref 70–99)
Potassium: 3.7 mmol/L (ref 3.5–5.1)
Sodium: 140 mmol/L (ref 135–145)
Total Bilirubin: 0.4 mg/dL (ref 0.3–1.2)
Total Protein: 5.9 g/dL — ABNORMAL LOW (ref 6.5–8.1)

## 2022-02-05 NOTE — Progress Notes (Signed)
Received report on patient from off-going RN at 1530. Will resume patient's care for the rest of the shift and will continue to monitor patient for any changes. RN agrees with patient's most recent assessment.

## 2022-02-05 NOTE — Progress Notes (Signed)
Progress Note   Patient: [name] AQT:[id number] DOB: 1945/12/26 DOA: 01/28/2022     8 DOS: the patient was seen and examined on 02/05/2022   Brief hospital course: 76 year old F with PMH of COPD/chronic hypoxic RF on 3.5L recently increased to 5L, NSCLC of left lung s/p chemoradiation currently on immunotherapy, paroxysmal A-fib not on AC due to amyloid angiopathy of brain, hypothyroidism, hyperlipidemia and tobacco use disorder presented to PCP with lightheadedness, wooziness and increased oxygen requirement for about 5 days, and sent to ED due to acute on chronic respiratory failure with hypoxia with oxygen saturation in the range of 70 to 78% on 5 L by [place].   In ED, she initially required NRB but weaned down to 10 L by HFNC later.  ABG with mild hypercarbia.  BNP and serial troponin negative.  CTA chest negative for PE but new GGO opacities with interlobular septal thickening throughout the right lung, persistent irregular appearing small left pleural effusion, progressive interstitial alveolar opacity throughout the LLL and slightly increased multiple mildly prominent mediastinal and right healer lymph nodes.  Patient was started on IV ceftriaxone and Solu-Medrol for possible pneumonia/immunotherapy related pneumonitis.  Oncology consulted and recommended CAP coverage and higher dose of steroid.   Patient continues to require 10 L by HFNC but has no respiratory distress.  Pulmonology consulted.  Assessment and Plan: Acute on chronic respiratory failure with hypoxia and hypercapnia:  On 3.5 L at baseline but lately required up to 5 L at home.  She has no respiratory distress but requiring 10 L to maintain saturation in upper 80s.  COVID-19, influenza and RSV PCR nonreactive.  Serial troponin and BNP are within normal.  Unfortunately, she continues to smoke about a pack a day.  CTA chest negative for PE but new GGO opacities with interlobular septal thickening throughout the right lung,  persistent irregular appearing small left pleural effusion, progressive interstitial alveolar opacity throughout the LLL and slightly increased multiple mildly prominent mediastinal and right healer lymph nodes.   -Continue holding immunotherapy until outpatient follow-up with oncology. -Continue ceftriaxone, Solu-Medrol and Azithromycin. -Continue Brovana.  Change Incruse Ellipta to [employer].  Add budesonide.  DuoNebs as needed. -Wean oxygen as able.  Minimum oxygen to keep saturation above 87%. -Continue home Lasix 20 mg daily -Incentive spirometry, OOB, PT/OT -Encouraged smoking cessation.  Nicotine patch ordered. -Consulted pulmonology. Concerns that this might be immunotherapy related or amiodarone related pneumonitis.  -Seen by Cardiology who agreed with holding amiodarone -Pt reports breathing better today than yesterday, now down to [facility] -Per Pulmonary, would not discharge if still needing over 5L   COPD GOLD III:  no respiratory distress other than increased oxygen requirement.  -Management as above.   Non-small cell lung cancer:follows with oncology, Dr. Curt Bears.  History of remote LUL lobectomy.  S/p recent chemoradiation now on active immunotherapy with Durvalumab. -Appreciate input by oncology-antibiotics and steroid as above, and hold immunotherapy   Paroxysmal atrial fibrillation: In sinus rhythm.  Not on anticoagulation due to concern for high bleeding risk in the setting of cerebral amyloid angiopathy. -Continue Toprol-XL and aspirin -Amiodarone now stopped, per above. Cardiology to follow as outpatient   History of CAD/CABG/stent: Stable. -Continue home medication   Elevated creatinine: AKI ruled out.  Stable. -Continue monitoring   Hypokalemia: Normalized.   Acquired hypothyroidism -Continue Synthroid.   Dyslipidemia -Held statin for now given recent increased LFT's -Likely resume statin if LFT's further improved and when discharged    Leukocytosis/bandemia: Likely from  steroid. -Monitor   Hyperglycemia: Likely from steroid.  Glucose within acceptable range.  6.2% in 2022. -Monitor blood glucose with daily labs. -Check hemoglobin A1c   Tobacco use disorder: Reports smoking about a pack a day. -Encouraged cessation   Physical deconditioning: -PT/OT   Goal of care discussion: Prefers to remain full code but does not want to have life prolonged on a ventilator if unsuccessful.  Designate her husband as Ambulance person if she is not able to   Morbid obesity Body mass index is 37.06 kg/m.  Elevated LFT -Peak AST/ALT of 202/422 -LFT's trending down -Reviewed RUQ [place], notable only for cholelithiasis without evidence of obstruction or cholecystitis -Holding statin for now, likely resume once LFT's normalize       Subjective: Reports breathing better today than yesterday  Physical Exam: Vitals:   02/05/22 0403 02/05/22 0849 02/05/22 1000 02/05/22 1321  BP: 116/69   118/65  Pulse: 64   75  Resp: 19   20  Temp: 97.9 F (36.6 C)   98.7 F (37.1 C)  TempSrc: Oral   Oral  SpO2: 92% 93% 91% 94%  Weight: 93.1 kg     Height:       General exam: Awake, laying in bed, in nad Respiratory system: Normal respiratory effort, no wheezing Cardiovascular system: regular rate, s1, s2 Gastrointestinal system: Soft, nondistended, positive BS Central nervous system: CN2-12 grossly intact, strength intact Extremities: Perfused, no clubbing Skin: Normal skin turgor, no notable skin lesions seen Psychiatry: Mood normal // no visual hallucinations   Data Reviewed:  Labs reviewed: Na 140, K 3.7, Cr 0.97, WBC 19.6   Family Communication: Pt in room, family at bedside  Disposition: Status is: Inpatient Remains inpatient appropriate because: Severity of illness  Planned Discharge Destination: Home    Author: Marylu Lund, MD 02/05/2022 3:06 PM  For on call review [email or web address].

## 2022-02-05 NOTE — Progress Notes (Signed)
Physical Therapy Treatment Patient Details Name: [name] MRN: [id number] DOB: 03-18-1945 Today's Date: 02/05/2022   History of Present Illness 76 year old F with PMH of COPD/chronic hypoxic RF on 2 to 3 L, NSCLC of left lung s/p chemoradiation currently on immunotherapy, paroxysmal A-fib, amyloid angiopathy of brain, hypothyroidism, hyperlipidemia and tobacco use disorder presented to to ED 01/28/22 with lightheadedness, respiratory failure with hypoxia with oxygen saturation in the range of 70 to 78%, required NRB    PT Comments    Pt is progressing this session. Improved activity tolerance , able to maintain sats in 80s to 90s on 6L HFNC.  Pt is motivated to mobilize  Recommendations for follow up therapy are one component of a multi-disciplinary discharge planning process, led by the attending physician.  Recommendations may be updated based on patient status, additional functional criteria and insurance authorization.  Follow Up Recommendations  No PT follow up     Assistance Recommended at Discharge PRN  Patient can return home with the following A little help with bathing/dressing/bathroom;Help with stairs or ramp for entrance;Assist for transportation;Assistance with cooking/housework   Equipment Recommendations  None recommended by PT    Recommendations for Other Services       Precautions / Restrictions Precautions Precautions: Fall Precaution Comments: monitor O2 Restrictions Weight Bearing Restrictions: No     Mobility  Bed Mobility                    Transfers Overall transfer level: Modified independent Equipment used: None               General transfer comment: in chair    Ambulation/Gait Ambulation/Gait assistance: Supervision Gait Distance (Feet): 120 Feet (60') Assistive device: IV Pole, None Gait Pattern/deviations: Step-through pattern, Decreased stride length       General Gait Details: supervision for safety; SpO2=  81% brielfy on 6L, dyspnea 2-3/4. multiple standing rests and encouraged proper breathing. pt recovers quickly to 90s on 6L with standing rest; maintains sats in 90s at rest. able to converse throughout   Stairs             Wheelchair Mobility    Modified Rankin (Stroke Patients Only)       Balance                                            Cognition Arousal/Alertness: Awake/alert Behavior During Therapy: WFL for tasks assessed/performed Overall Cognitive Status: Within Functional Limits for tasks assessed                                          Exercises      General Comments        Pertinent Vitals/Pain Pain Assessment Pain Assessment: No/denies pain    Home Living                          Prior Function            PT Goals (current goals can now be found in the care plan section) Acute Rehab PT Goals Patient Stated Goal: to go home PT Goal Formulation: With patient Time For Goal Achievement: 02/14/22 Potential to Achieve Goals: Good Progress towards PT goals: Progressing toward goals  Frequency    Min 3X/week      PT Plan Current plan remains appropriate    Co-evaluation              AM-PAC PT "6 Clicks" Mobility   Outcome Measure  Help needed turning from your back to your side while in a flat bed without using bedrails?: None Help needed moving from lying on your back to sitting on the side of a flat bed without using bedrails?: None Help needed moving to and from a bed to a chair (including a wheelchair)?: None Help needed standing up from a chair using your arms (e.g., wheelchair or bedside chair)?: None Help needed to walk in hospital room?: None Help needed climbing 3-5 steps with a railing? : A Little 6 Click Score: 23    End of Session Equipment Utilized During Treatment: Oxygen Activity Tolerance: Patient tolerated treatment well;Treatment limited secondary to medical  complications (Comment) (decr O2) Patient left: in chair;with call bell/phone within reach;with family/visitor present Nurse Communication: Mobility status PT Visit Diagnosis: Difficulty in walking, not elsewhere classified (R26.2)     Time: 1583-0940 PT Time Calculation (min) (ACUTE ONLY): 51 min  Charges:  $Gait Training: 38-52 mins                     Asha Grumbine, PT  Acute Rehab Dept [facility]) [phone]  [facility] Weekend Pager (Saturday/Sunday only)  [phone]  02/05/2022    [facility] 02/05/2022, 3:59 PM

## 2022-02-05 NOTE — Progress Notes (Signed)
Patient's 02 decreased to 6L/HFNC per patient's request. O2 remaining 90-91% at rest while sitting in chair. O2 dropped to 84% 6L HFNC when transferring to [facility], but pt quickly recovered to 90% and was asymptomatic. Pt able to have a full conversation comfortably.

## 2022-02-06 DIAGNOSIS — J9621 Acute and chronic respiratory failure with hypoxia: Secondary | ICD-10-CM | POA: Diagnosis not present

## 2022-02-06 DIAGNOSIS — J9622 Acute and chronic respiratory failure with hypercapnia: Secondary | ICD-10-CM | POA: Diagnosis not present

## 2022-02-06 LAB — CBC
HCT: 36.7 % (ref 36.0–46.0)
Hemoglobin: 11.1 g/dL — ABNORMAL LOW (ref 12.0–15.0)
MCH: 27.3 pg (ref 26.0–34.0)
MCHC: 30.2 g/dL (ref 30.0–36.0)
MCV: 90.4 fL (ref 80.0–100.0)
Platelets: 287 10*3/uL (ref 150–400)
RBC: 4.06 MIL/uL (ref 3.87–5.11)
RDW: 15.7 % — ABNORMAL HIGH (ref 11.5–15.5)
WBC: 18.8 10*3/uL — ABNORMAL HIGH (ref 4.0–10.5)
nRBC: 0 % (ref 0.0–0.2)

## 2022-02-06 LAB — COMPREHENSIVE METABOLIC PANEL
ALT: 293 U/L — ABNORMAL HIGH (ref 0–44)
AST: 90 U/L — ABNORMAL HIGH (ref 15–41)
Albumin: 2.5 g/dL — ABNORMAL LOW (ref 3.5–5.0)
Alkaline Phosphatase: 74 U/L (ref 38–126)
Anion gap: 6 (ref 5–15)
BUN: 34 mg/dL — ABNORMAL HIGH (ref 8–23)
CO2: 35 mmol/L — ABNORMAL HIGH (ref 22–32)
Calcium: 8.2 mg/dL — ABNORMAL LOW (ref 8.9–10.3)
Chloride: 101 mmol/L (ref 98–111)
Creatinine, Ser: 0.91 mg/dL (ref 0.44–1.00)
GFR, Estimated: >60 mL/min (ref >60)
Glucose, Bld: 117 mg/dL — ABNORMAL HIGH (ref 70–99)
Potassium: 3.7 mmol/L (ref 3.5–5.1)
Sodium: 142 mmol/L (ref 135–145)
Total Bilirubin: 0.5 mg/dL (ref 0.3–1.2)
Total Protein: 5.6 g/dL — ABNORMAL LOW (ref 6.5–8.1)

## 2022-02-06 MED ORDER — PREDNISONE 50 MG PO TABS
60.0000 mg | ORAL_TABLET | Freq: Every day | ORAL | Status: DC
  Administered 2022-02-07 – 2022-02-08 (×2): 60 mg via ORAL
  Filled 2022-02-06 (×2): qty 1

## 2022-02-06 MED ORDER — ALBUTEROL SULFATE (2.5 MG/3ML) 0.083% IN NEBU: 2.5000 mg | INHALATION_SOLUTION | RESPIRATORY_TRACT | Status: DC

## 2022-02-06 NOTE — Plan of Care (Signed)
  Problem: Activity: Goal: Ability to tolerate increased activity will improve Outcome: Progressing   Problem: Respiratory: Goal: Ability to maintain adequate ventilation will improve Outcome: Progressing Goal: Ability to maintain a clear airway will improve Outcome: Progressing   Problem: Health Behavior/Discharge Planning: Goal: Ability to manage health-related needs will improve Outcome: Progressing   Problem: Clinical Measurements: Goal: Ability to maintain clinical measurements within normal limits will improve Outcome: Progressing   Problem: Coping: Goal: Level of anxiety will decrease Outcome: Progressing

## 2022-02-06 NOTE — Telephone Encounter (Signed)
Patient still currently admitted.

## 2022-02-06 NOTE — Progress Notes (Signed)
Mobility Specialist - Progress Note  Pre-mobility: 92% SpO2 During mobility: 77% SpO2 Post-mobility: 91% SPO2   02/06/22 1233  Oxygen Therapy  O2 Device Nasal Cannula  O2 Flow Rate (L/min) 6 L/min  Mobility  Activity Ambulated independently in hallway  Level of Assistance Standby assist, set-up cues, supervision of patient - no hands on  Assistive Device None  Distance Ambulated (ft) 120 ft  Range of Motion/Exercises Active  Activity Response Tolerated well  Mobility Referral Yes  $Mobility charge 1 Mobility   Pt was found on recliner chair and agreeable to ambulate. Stated feeling a little lightheaded and had a LOB during ambulation that she was able to self correct. Had x2 standing rest breaks during session as well. At EOS returned to recliner chair with necessities in reach and family in room.  Ferd Hibbs Mobility Specialist

## 2022-02-06 NOTE — Progress Notes (Signed)
PROGRESS NOTE   [name]  TDD:[id number] DOB: [birth year] DOA: 01/28/2022 PCP: [name], NP  Brief Narrative:  76 year old female COPD 3.5 L O2-continue tobacco Prior MI unclear Stage IIIc NSCLC L lung s/p LUL lobectomy 08/2010-XRT 12/22 for recurrence-sees Dr. Hewitt Blade A-fib developing 08/11/2021 no AC 2/2 amyloid angiopathy brain Hypothyroid HTN HLD  Last hospitalization 6/21-6/24/2023--chest tube placed 6/10 [facility] for malignant pleural effusion--developed further shortness of breath and admitted to [facility] found to have SVT new A-fib was not discharged on anticoagulation as above  Represented 01/28/2022 lightheadedness wooziness with exertion-requiring more oxygen up to 6 L as opposed to usual 2-3 and sent to emergency room for evaluation--chest CT showed groundglass opacities small pleural effusion and this was felt to be pneumonia-was placed on 10 L high flow nasal cannula  Immunotherapy was held patient was given both antibiotics as well as Lasix--ultimately felt this could be immune mediated pneumonitis  12/11 admit for hypoxia. Azithro rocephin, steroids, lasix 12/12 onc consult. Steroids increased  12/15 persistently elevated O2 req -- 10 HFNC, PCCM consulted  12/16 amiodarone stopped    Hospital-Problem based course  Acute superimposed on chronic respiratory failure baseline 3.5 L-etiology?  Pneumonia versus amiodarone versus spread of lymphangitic NSCLC -Needing up to 10 L previously now better and slept 5 to 6 L range - Had opacities in lung unclear etiology completed azithromycin and ceftriaxone 12/17 - Solu-Medrol changed to prednisone 60, aim to keep dry with Lasix 20  COPD GOLD stg iii Still smoker  NSCLC stage IIIc -Meds have been changed by pulmonary to include Yupelri, DuoNeb, guaifenesin, budesonide, Brovana - I have added albuterol -Outpatient follow-up with Dr. Earlie Server  Paroxysmal A-fib-in sinus Not on  anticoagulation secondary to cerebral angiopathy - Continue metoprolol 100 daily XL is in sinus today can discontinue monitors  DVT prophylaxis: Lovenox Code Status: Full Family Communication: Husband bedside Disposition:  Status is: Inpatient Remains inpatient appropriate because:   Needs to have less oxygen requirement Therapy not recommending anything other than outpatient with no PT follow-up   Consultants:  Pulmonology, cardiology  Procedures:   Antimicrobials:     Subjective: Awake coherent alert some cough his sitting out in chair however seems comfortable-does seem to have an "hanging" in the chest at times  Objective: Vitals:   02/05/22 1321 02/05/22 2100 02/05/22 2128 02/06/22 0533  BP: 118/65 125/63  126/65  Pulse: 75 76 67 61  Resp: 20 18 18 19   Temp: 98.7 F (37.1 C) 98.9 F (37.2 C)  97.9 F (36.6 C)  TempSrc: Oral Oral  Oral  SpO2: 94% 94% 91% 93%  Weight:    93.8 kg  Height:        Intake/Output Summary (Last 24 hours) at 02/06/2022 0724 Last data filed at 02/06/2022 0500 Gross per 24 hour  Intake --  Output 2250 ml  Net -2250 ml   Filed Weights   02/04/22 0406 02/05/22 0403 02/06/22 0533  Weight: 92.6 kg 93.1 kg 93.8 kg    Examination:  Alert pleasant no distress--on 5 L but needed 6 L to walk EOMI NCAT no focal deficit CTA B with no rales rhonchi Abdomen soft nontender  Neuro intact moving 4 limbs equally S1-S2 no murmur seems to be in sinus on monitor  Data Reviewed: personally reviewed   CBC    Component Value Date/Time   WBC 18.8 (H) 02/06/2022 0318   RBC 4.06 02/06/2022 0318   HGB 11.1 (L) 02/06/2022 6237  HGB 11.2 (L) 01/14/2022 0925   HGB 13.3 05/02/2021 1055   HCT 36.7 02/06/2022 0318   HCT 40.8 05/02/2021 1055   PLT 287 02/06/2022 0318   PLT 244 01/14/2022 0925   PLT 220 05/02/2021 1055   MCV 90.4 02/06/2022 0318   MCV 88 05/02/2021 1055   MCH 27.3 02/06/2022 0318   MCHC 30.2 02/06/2022 0318   RDW 15.7 (H)  02/06/2022 0318   RDW 13.3 05/02/2021 1055   LYMPHSABS 1.0 01/28/2022 1554   LYMPHSABS 2.2 10/25/2020 0935   MONOABS 1.5 (H) 01/28/2022 1554   EOSABS 0.1 01/28/2022 1554   EOSABS 0.1 10/25/2020 0935   BASOSABS 0.1 01/28/2022 1554   BASOSABS 0.1 10/25/2020 0935      Latest Ref Rng & Units 02/06/2022    3:18 AM 02/05/2022    5:30 AM 02/04/2022    2:55 AM  CMP  Glucose 70 - 99 mg/dL 117  115  144   BUN 8 - 23 mg/dL 34  36  30   Creatinine 0.44 - 1.00 mg/dL 0.91  0.97  0.98   Sodium 135 - 145 mmol/L 142  140  141   Potassium 3.5 - 5.1 mmol/L 3.7  3.7  4.0   Chloride 98 - 111 mmol/L 101  101  101   CO2 22 - 32 mmol/L 35  32  33   Calcium 8.9 - 10.3 mg/dL 8.2  8.4  8.3   Total Protein 6.5 - 8.1 g/dL 5.6  5.9  6.2   Total Bilirubin 0.3 - 1.2 mg/dL 0.5  0.4  0.4   Alkaline Phos 38 - 126 U/L 74  73  81   AST 15 - 41 U/L 90  116  134   ALT 0 - 44 U/L 293  335  366      Radiology Studies: US Abdomen Limited RUQ (LIVER/GB)  Result Date: 02/04/2022 CLINICAL DATA:  Elevated LFTs EXAM: ULTRASOUND ABDOMEN LIMITED RIGHT UPPER QUADRANT COMPARISON:  None Available. FINDINGS: Gallbladder: Nondilated gallbladder. Cholelithiasis with significant shadowing. No wall thickening. Negative sonographic Murphy sign. Largest gallstone measures up to 1.2 cm. Common bile duct: Diameter: 6.1 mm, normal.  No intrahepatic ductal dilation. Liver: Normal echogenicity. There is a left hepatic cyst measuring up to 1.2 cm and a right hepatic cyst measuring up to 1.0 cm, as seen on recent CT. Portal vein is patent on color Doppler imaging with normal direction of blood flow towards the liver. Other: None. IMPRESSION: Cholelithiasis. No evidence of acute cholecystitis or biliary obstruction. Unremarkable sonographic appearance of the liver. Electronically Signed   By: Maurine Simmering M.D.   On: 02/04/2022 20:57     Scheduled Meds:  albuterol  2.5 mg Nebulization Q4H   arformoterol  15 mcg Nebulization BID   aspirin  81  mg Oral QHS   budesonide (PULMICORT) nebulizer solution  0.5 mg Nebulization BID   Chlorhexidine Gluconate Cloth  6 each Topical Daily   enoxaparin (LOVENOX) injection  40 mg Subcutaneous Q24H   furosemide  40 mg Intravenous Daily   levothyroxine  12.5 mcg Oral Once per day on Mon Tue Wed Thu Fri Sat   levothyroxine  50 mcg Oral Q0600   metoprolol succinate  100 mg Oral Daily   nicotine  21 mg Transdermal Daily   mouth rinse  15 mL Mouth Rinse 4 times per day   potassium chloride  10 mEq Oral QODAY   [START ON 02/07/2022] predniSONE  60 mg Oral QAC breakfast  revefenacin  175 mcg Nebulization Daily   sodium chloride flush  3 mL Intravenous Q12H   Continuous Infusions:   LOS: 9 days   Time spent: [place], MD [facility] To contact the attending provider between 7A-7P or the covering provider during after hours 7P-7A, please log into the web site [email or web address] and access using universal [place] password for that web site. If you do not have the password, please call the hospital operator.  02/06/2022, 7:24 AM

## 2022-02-07 ENCOUNTER — Other Ambulatory Visit: Payer: Medicare HMO

## 2022-02-07 ENCOUNTER — Ambulatory Visit: Payer: Medicare HMO | Admitting: Internal Medicine

## 2022-02-07 ENCOUNTER — Ambulatory Visit: Payer: Medicare HMO

## 2022-02-07 DIAGNOSIS — J9621 Acute and chronic respiratory failure with hypoxia: Secondary | ICD-10-CM | POA: Diagnosis not present

## 2022-02-07 DIAGNOSIS — J9622 Acute and chronic respiratory failure with hypercapnia: Secondary | ICD-10-CM | POA: Diagnosis not present

## 2022-02-07 LAB — COMPREHENSIVE METABOLIC PANEL
ALT: 322 U/L — ABNORMAL HIGH (ref 0–44)
AST: 116 U/L — ABNORMAL HIGH (ref 15–41)
Albumin: 2.4 g/dL — ABNORMAL LOW (ref 3.5–5.0)
Alkaline Phosphatase: 76 U/L (ref 38–126)
Anion gap: 6 (ref 5–15)
BUN: 33 mg/dL — ABNORMAL HIGH (ref 8–23)
CO2: 35 mmol/L — ABNORMAL HIGH (ref 22–32)
Calcium: 8.4 mg/dL — ABNORMAL LOW (ref 8.9–10.3)
Chloride: 100 mmol/L (ref 98–111)
Creatinine, Ser: 1.06 mg/dL — ABNORMAL HIGH (ref 0.44–1.00)
GFR, Estimated: 55 mL/min — ABNORMAL LOW (ref >60)
Glucose, Bld: 104 mg/dL — ABNORMAL HIGH (ref 70–99)
Potassium: 4.3 mmol/L (ref 3.5–5.1)
Sodium: 141 mmol/L (ref 135–145)
Total Bilirubin: 0.4 mg/dL (ref 0.3–1.2)
Total Protein: 5.4 g/dL — ABNORMAL LOW (ref 6.5–8.1)

## 2022-02-07 MED ORDER — FUROSEMIDE 10 MG/ML IJ SOLN
40.0000 mg | Freq: Two times a day (BID) | INTRAMUSCULAR | Status: DC
  Administered 2022-02-07 – 2022-02-08 (×2): 40 mg via INTRAVENOUS
  Filled 2022-02-07 (×2): qty 4

## 2022-02-07 NOTE — Progress Notes (Signed)
PROGRESS NOTE   [name]  JQZ:[id number] DOB: 11-02-1945 DOA: 01/28/2022 PCP: [name], NP  Brief Narrative:  76 year old female COPD 3.5 L O2-continue tobacco Prior MI unclear Stage IIIc NSCLC L lung s/p LUL lobectomy 08/2010-XRT 12/22 for recurrence-sees Dr. Hewitt Blade A-fib developing 08/11/2021 no AC 2/2 amyloid angiopathy brain Hypothyroid HTN HLD  Last hospitalization 6/21-6/24/2023--chest tube placed 6/10 [facility] for malignant pleural effusion--developed further shortness of breath and admitted to [facility] found to have SVT new A-fib was not discharged on anticoagulation as above  Represented 01/28/2022 lightheadedness wooziness with exertion-requiring more oxygen up to 6 L as opposed to usual 2-3 and sent to emergency room for evaluation--chest CT showed groundglass opacities small pleural effusion and this was felt to be pneumonia-was placed on 10 L high flow nasal cannula  Immunotherapy was held patient was given both antibiotics as well as Lasix--ultimately felt this could be immune mediated pneumonitis  12/11 admit for hypoxia. Azithro rocephin, steroids, lasix 12/12 onc consult. Steroids increased  12/15 persistently elevated O2 req -- 10 HFNC, PCCM consulted  12/16 amiodarone stopped    Hospital-Problem based course  Acute superimposed on chronic respiratory failure baseline 3.5 L-etiology?  Pneumonia versus amiodarone versus spread of lymphangitic NSCLC -Needing up to 10 L previously now better and slept 5 to 6 L range -Ambulating she needed up to 8 L today so we are still not ready for discharge -I will increase Lasix to 40 IV twice daily and see if this makes a difference to her breathing - Had opacities in lung unclear etiology completed azithromycin and ceftriaxone 12/17 - Solu-Medrol changed to prednisone 60 on 12/21  COPD GOLD stg iii Still smoker  NSCLC stage IIIc -Meds have been changed by pulmonary to include Yupelri,  DuoNeb, guaifenesin, budesonide, Brovana - I have added albuterol -Outpatient follow-up with Dr. Earlie Server  Paroxysmal A-fib-in sinus Not on anticoagulation secondary to cerebral angiopathy needs outpatient physical therapy - Continue metoprolol 100 daily XL is in sinus today can discontinue monitors  DVT prophylaxis: Lovenox Code Status: Full Family Communication: Husband bedside Disposition:  Status is: Inpatient Remains inpatient appropriate because:   Needs to have less oxygen requirement With ambulation and about 5 L of oxygen    Consultants:  Pulmonology, cardiology  Procedures:   Antimicrobials:     Subjective:  Fair no distress, walked today but needed 8 L oxygen No pain fever chills sputum  Objective: Vitals:   02/07/22 0500 02/07/22 0916 02/07/22 1200 02/07/22 1249  BP:    114/76  Pulse:    72  Resp:    20  Temp:    98.2 F (36.8 C)  TempSrc:    Oral  SpO2:  93% 93% 95%  Weight: 93.5 kg     Height:        Intake/Output Summary (Last 24 hours) at 02/07/2022 1435 Last data filed at 02/07/2022 1300 Gross per 24 hour  Intake 720 ml  Output 1150 ml  Net -430 ml    Filed Weights   02/05/22 0403 02/06/22 0533 02/07/22 0500  Weight: 93.1 kg 93.8 kg 93.5 kg    Examination:  Alert pleasant no distress--on 5 L at present--desats to 85-87 when oxygen flow is 3L to 87  EOMI NCAT no focal deficit CTA B with no rales rhonchi no wheeze Abdomen soft nontender  Neuro intact moving 4 limbs equally S1-S2 no murmur seems to be in sinus on monitor  Data Reviewed: personally reviewed  CBC    Component Value Date/Time   WBC 18.8 (H) 02/06/2022 0318   RBC 4.06 02/06/2022 0318   HGB 11.1 (L) 02/06/2022 0318   HGB 11.2 (L) 01/14/2022 0925   HGB 13.3 05/02/2021 1055   HCT 36.7 02/06/2022 0318   HCT 40.8 05/02/2021 1055   PLT 287 02/06/2022 0318   PLT 244 01/14/2022 0925   PLT 220 05/02/2021 1055   MCV 90.4 02/06/2022 0318   MCV 88 05/02/2021 1055    MCH 27.3 02/06/2022 0318   MCHC 30.2 02/06/2022 0318   RDW 15.7 (H) 02/06/2022 0318   RDW 13.3 05/02/2021 1055   LYMPHSABS 1.0 01/28/2022 1554   LYMPHSABS 2.2 10/25/2020 0935   MONOABS 1.5 (H) 01/28/2022 1554   EOSABS 0.1 01/28/2022 1554   EOSABS 0.1 10/25/2020 0935   BASOSABS 0.1 01/28/2022 1554   BASOSABS 0.1 10/25/2020 0935      Latest Ref Rng & Units 02/07/2022    4:16 AM 02/06/2022    3:18 AM 02/05/2022    5:30 AM  CMP  Glucose 70 - 99 mg/dL 104  117  115   BUN 8 - 23 mg/dL 33  34  36   Creatinine 0.44 - 1.00 mg/dL 1.06  0.91  0.97   Sodium 135 - 145 mmol/L 141  142  140   Potassium 3.5 - 5.1 mmol/L 4.3  3.7  3.7   Chloride 98 - 111 mmol/L 100  101  101   CO2 22 - 32 mmol/L 35  35  32   Calcium 8.9 - 10.3 mg/dL 8.4  8.2  8.4   Total Protein 6.5 - 8.1 g/dL 5.4  5.6  5.9   Total Bilirubin 0.3 - 1.2 mg/dL 0.4  0.5  0.4   Alkaline Phos 38 - 126 U/L 76  74  73   AST 15 - 41 U/L 116  90  116   ALT 0 - 44 U/L 322  293  335      Radiology Studies: No results found.   Scheduled Meds:  arformoterol  15 mcg Nebulization BID   aspirin  81 mg Oral QHS   budesonide (PULMICORT) nebulizer solution  0.5 mg Nebulization BID   Chlorhexidine Gluconate Cloth  6 each Topical Daily   enoxaparin (LOVENOX) injection  40 mg Subcutaneous Q24H   furosemide  40 mg Intravenous Daily   levothyroxine  12.5 mcg Oral Once per day on Mon Tue Wed Thu Fri Sat   levothyroxine  50 mcg Oral Q0600   metoprolol succinate  100 mg Oral Daily   nicotine  21 mg Transdermal Daily   mouth rinse  15 mL Mouth Rinse 4 times per day   potassium chloride  10 mEq Oral QODAY   predniSONE  60 mg Oral QAC breakfast   revefenacin  175 mcg Nebulization Daily   sodium chloride flush  3 mL Intravenous Q12H   Continuous Infusions:   LOS: 10 days   Time spent: 24  Nita Sells, MD [facility] To contact the attending provider between 7A-7P or the covering provider during after hours 7P-7A, please  log into the web site [email or web address] and access using universal [place] password for that web site. If you do not have the password, please call the hospital operator.  02/07/2022, 2:35 PM

## 2022-02-07 NOTE — Progress Notes (Signed)
Mobility Specialist - Progress Note  Pre-mobility: 94% SpO2 (5L) During mobility: 86% SpO2 (6L) Post-mobility: 92% SPO2 (5L)   02/07/22 1350  Oxygen Therapy  O2 Device Nasal Cannula  O2 Flow Rate (L/min) 6 L/min  Patient Activity (if Appropriate) Ambulating  Mobility  Activity Ambulated independently in hallway  Level of Assistance Modified independent, requires aide device or extra time  Assistive Device None  Distance Ambulated (ft) 150 ft  Range of Motion/Exercises Active  Activity Response Tolerated well  Mobility Referral Yes  $Mobility charge 1 Mobility   Pt was found on recliner chair and agreeable to ambulate. Pt took about 3 brief standing rest breaks due to SPO2 dropping to 86% but was able to bring it up to 88%-90% within seconds. Once back in room pt stated wanting to know more information about placing an order for a rollator and who she could reach out to. Was left on recliner chair with necessities in reach and RN notified of pt questions and concerns.   Ferd Hibbs Mobility Specialist

## 2022-02-07 NOTE — Plan of Care (Signed)
  Problem: Activity: Goal: Ability to tolerate increased activity will improve Outcome: Progressing   Problem: Clinical Measurements: Goal: Ability to maintain a body temperature in the normal range will improve Outcome: Progressing   Problem: Respiratory: Goal: Ability to maintain adequate ventilation will improve Outcome: Progressing Goal: Ability to maintain a clear airway will improve Outcome: Progressing   Problem: Education: Goal: Knowledge of General Education information will improve Description: Including pain rating scale, medication(s)/side effects and non-pharmacologic comfort measures Outcome: Progressing   Problem: Health Behavior/Discharge Planning: Goal: Ability to manage health-related needs will improve Outcome: Progressing   Problem: Clinical Measurements: Goal: Ability to maintain clinical measurements within normal limits will improve Outcome: Progressing Goal: Will remain free from infection Outcome: Progressing Goal: Diagnostic test results will improve Outcome: Progressing Goal: Respiratory complications will improve Outcome: Progressing Goal: Cardiovascular complication will be avoided Outcome: Progressing   Problem: Activity: Goal: Risk for activity intolerance will decrease Outcome: Progressing   Problem: Safety: Goal: Ability to remain free from injury will improve Outcome: Progressing

## 2022-02-08 ENCOUNTER — Inpatient Hospital Stay (HOSPITAL_COMMUNITY): Payer: Medicare HMO

## 2022-02-08 DIAGNOSIS — J9621 Acute and chronic respiratory failure with hypoxia: Secondary | ICD-10-CM | POA: Diagnosis not present

## 2022-02-08 DIAGNOSIS — J9622 Acute and chronic respiratory failure with hypercapnia: Secondary | ICD-10-CM | POA: Diagnosis not present

## 2022-02-08 DIAGNOSIS — J90 Pleural effusion, not elsewhere classified: Secondary | ICD-10-CM | POA: Diagnosis not present

## 2022-02-08 LAB — BASIC METABOLIC PANEL
Anion gap: 7 (ref 5–15)
BUN: 27 mg/dL — ABNORMAL HIGH (ref 8–23)
CO2: 34 mmol/L — ABNORMAL HIGH (ref 22–32)
Calcium: 8.1 mg/dL — ABNORMAL LOW (ref 8.9–10.3)
Chloride: 97 mmol/L — ABNORMAL LOW (ref 98–111)
Creatinine, Ser: 0.92 mg/dL (ref 0.44–1.00)
GFR, Estimated: >60 mL/min (ref >60)
Glucose, Bld: 111 mg/dL — ABNORMAL HIGH (ref 70–99)
Potassium: 3.5 mmol/L (ref 3.5–5.1)
Sodium: 138 mmol/L (ref 135–145)

## 2022-02-08 MED ORDER — PREDNISONE 10 MG PO TABS: ORAL_TABLET | ORAL | 0 refills | Status: DC

## 2022-02-08 MED ORDER — IPRATROPIUM-ALBUTEROL 0.5-2.5 (3) MG/3ML IN SOLN: 3.0000 mL | RESPIRATORY_TRACT | 1 refills | Status: DC | PRN

## 2022-02-08 MED ORDER — ARFORMOTEROL TARTRATE 15 MCG/2ML IN NEBU: 15.0000 ug | INHALATION_SOLUTION | Freq: Two times a day (BID) | RESPIRATORY_TRACT | 0 refills | Status: DC

## 2022-02-08 MED ORDER — HEPARIN SOD (PORK) LOCK FLUSH 100 UNIT/ML IV SOLN
500.0000 [IU] | INTRAVENOUS | Status: AC | PRN
  Administered 2022-02-08: 500 [IU]

## 2022-02-08 MED ORDER — SULFAMETHOXAZOLE-TRIMETHOPRIM 800-160 MG PO TABS: 1.0000 | ORAL_TABLET | ORAL | 0 refills | Status: DC

## 2022-02-08 MED ORDER — REVEFENACIN 175 MCG/3ML IN SOLN: 175.0000 ug | Freq: Every day | RESPIRATORY_TRACT | 1 refills | Status: DC

## 2022-02-08 MED ORDER — BUDESONIDE 0.5 MG/2ML IN SUSP: 0.5000 mg | Freq: Two times a day (BID) | RESPIRATORY_TRACT | 6 refills | Status: DC

## 2022-02-08 NOTE — Progress Notes (Signed)
Physical Therapy Treatment Patient Details Name: [name] MRN: [id number] DOB: [birth year] Today's Date: 02/08/2022   History of Present Illness 76 year old F with PMH of COPD/chronic hypoxic RF on 2 to 3 L, NSCLC of left lung s/p chemoradiation currently on immunotherapy, paroxysmal A-fib, amyloid angiopathy of brain, hypothyroidism, hyperlipidemia and tobacco use disorder presented to to ED 01/28/22 with lightheadedness, respiratory failure with hypoxia with oxygen saturation in the range of 70 to 78%, required NRB    PT Comments    Pt seen for PT tx with pt agreeable. Session focused on trialing rollator during gait to simulate community mobility. PT educates & provides demonstration re: brake management & parking rollator for rests breaks against wall if one is able. Pt is able to ambulate in hallway with mod I with good awareness & management of rollator brakes. Pt on 6L/min via nasal cannula with SpO2 dropping as low as 82% with pt able to recover to >/= 88% within 1 minute. Updated DME recommendations to include rollator for safe community ambulation.     Recommendations for follow up therapy are one component of a multi-disciplinary discharge planning process, led by the attending physician.  Recommendations may be updated based on patient status, additional functional criteria and insurance authorization.  Follow Up Recommendations  No PT follow up     Assistance Recommended at Discharge PRN  Patient can return home with the following A little help with bathing/dressing/bathroom;Help with stairs or ramp for entrance;Assist for transportation;Assistance with Landscape architect (4 wheels)    Recommendations for Other Services       Precautions / Restrictions Precautions Precautions: Fall Precaution Comments: monitor O2 Restrictions Weight Bearing Restrictions: No     Mobility  Bed Mobility               General bed  mobility comments: Not tested, pt received & lef in recliner    Transfers Overall transfer level: Modified independent Equipment used: Rollator (4 wheels) Transfers: Sit to/from Stand Sit to Stand: Modified independent (Device/Increase time)           General transfer comment: good awareness & ability to manage rollator brakes after PT provides education & demonstration    Ambulation/Gait Ambulation/Gait assistance: Modified independent (Device/Increase time) Gait Distance (Feet): 100 Feet Assistive device: Rollator (4 wheels) Gait Pattern/deviations: Decreased step length - right, Decreased step length - left, Decreased stride length Gait velocity: decreased     General Gait Details: 2 seated rest breaks   Stairs             Wheelchair Mobility    Modified Rankin (Stroke Patients Only)       Balance Overall balance assessment: Needs assistance Sitting-balance support: Feet supported Sitting balance-Leahy Scale: Good     Standing balance support: During functional activity, Bilateral upper extremity supported Standing balance-Leahy Scale: Good                              Cognition Arousal/Alertness: Awake/alert Behavior During Therapy: WFL for tasks assessed/performed Overall Cognitive Status: Within Functional Limits for tasks assessed                                 General Comments: Pt with good insight & understanding of situation & education.        Exercises Other Exercises Other Exercises: Educated pt on use  of incentive spirometer & acapella flutter valve, encouraged pt to use both & educated her on purpose of each one.    General Comments        Pertinent Vitals/Pain Pain Assessment Pain Assessment: No/denies pain    Home Living                          Prior Function            PT Goals (current goals can now be found in the care plan section) Acute Rehab PT Goals Patient Stated Goal:  decrease supplemental O2 needs PT Goal Formulation: With patient Time For Goal Achievement: 02/14/22 Potential to Achieve Goals: Good Progress towards PT goals: Progressing toward goals    Frequency    Min 3X/week      PT Plan Equipment recommendations need to be updated    Co-evaluation              AM-PAC PT "6 Clicks" Mobility   Outcome Measure  Help needed turning from your back to your side while in a flat bed without using bedrails?: None Help needed moving from lying on your back to sitting on the side of a flat bed without using bedrails?: None Help needed moving to and from a bed to a chair (including a wheelchair)?: None Help needed standing up from a chair using your arms (e.g., wheelchair or bedside chair)?: None Help needed to walk in hospital room?: None Help needed climbing 3-5 steps with a railing? : A Little 6 Click Score: 23    End of Session Equipment Utilized During Treatment: Oxygen Activity Tolerance: Patient tolerated treatment well Patient left: in bed;with call bell/phone within reach;with family/visitor present Nurse Communication: Mobility status PT Visit Diagnosis: Difficulty in walking, not elsewhere classified (R26.2)     Time: 0383-3383 PT Time Calculation (min) (ACUTE ONLY): 18 min  Charges:  $Therapeutic Activity: 8-22 mins                     Lavone Nian, PT, DPT 02/08/22, 1:58 PM  Waunita Schooner 02/08/2022, 1:56 PM

## 2022-02-08 NOTE — Progress Notes (Signed)
NAME:  [name], MRN:  [id number], DOB:  May 17, 1945, LOS: 4 ADMISSION DATE:  01/28/2022, CONSULTATION DATE:  02/01/22 REFERRING MD:  Cyndia Skeeters - TRH, CHIEF COMPLAINT:  hypoxia  History of Present Illness:  76 yo F with Stg IIIa NSCLC s/p carboplatin and paclitaxel, and currently on immunotherapy Imfinzi q4wk, hx COPD, active tobacco use, chronic hypoxic respiratory failure on baseline 3-5L, Afib not on AC, cerebral amyloid angiopathy, who was admitted 12/11 for worse hypoxia, after having SpO2 70-80 on 5LNc at her PCP office earlier that day.  Admitted to [facility], CTA neg for PE but shows new GGOs, interlobular septal thickening, interstitial opacities. Started on azithro rocephin, steroids, lasix  Onc consulted 12/12, adding Possible immunotherapy related pneumonitis to ddx   PCCM consulted 12/15 for persistent hypoxia, on 10 HFNC at time of consultation  She was started on Imfinzi 8/31, status post 5 cycles  Pertinent  Medical History  Stg IIIa NSCLC  COPD Chronic hypoxia  Afib Amyloid angiopathy of brain  Significant Hospital Events: Including procedures, antibiotic start and stop dates in addition to other pertinent events   12/11 admit for hypoxia. Azithro rocephin, steroids, lasix 12/12 onc consult. Steroids increased  12/15 persistently elevated O2 req -- 10 HFNC, PCCM consulted  12/16 amiodarone stopped 12/22 re-eval needs 5L  Interim History / Subjective:   Stable on 5L. CR repeated today looks improved.   Objective   Blood pressure 121/67, pulse 60, temperature 98 F (36.7 C), temperature source Oral, resp. rate 18, height 5\' 3"  (1.6 m), weight 93.5 kg, SpO2 94 %.        Intake/Output Summary (Last 24 hours) at 02/08/2022 1212 Last data filed at 02/08/2022 0900 Gross per 24 hour  Intake 944 ml  Output 1750 ml  Net -806 ml    Filed Weights   02/05/22 0403 02/06/22 0533 02/07/22 0500  Weight: 93.1 kg 93.8 kg 93.5 kg    Examination: General: well appearing  older adult woman sitting up in bed, on her laptop, able to speak in full sentences without dyspnea HENT: No JVD ED, mild pallor, no icterus Lungs: Scattered bibasal crackles, no accessory muscle use Cardiovascular: rr s1s2 Abdomen: round soft ndnt  Extremities: No edema Neuro: AAOx3    Labs reviewed CT angiogram chest reviewed Chest x-ray 12/22 shows improved aeration of the right lung  Resolved Hospital Problem list     Assessment & Plan:   Acute on chronic hypoxic respiratory failure Stg IIIa NSCLC COPD  -likely immunotherapy related or amiodarone related pneumonitis.  Ddx atypical PNA, some pulm edema. CTA neg for PE.  She had minimal groundglass changes on the right on CT scan 8/19, likely increased in November and then flagrant in December. Amiodarone was started 07/2021, immunotherapy first dose was 8/31. -RVP negative, urine strep antigen negative, urine Legionella negative. Likely will have higher O2 need with recent lung injury. Time will tell if can improve in future.  P -Taper predniosne by 10 mg every week, start at 50 mg at discharge -Recommend bactrim SS tab once daily on MWF only - thrice weekly - for PJP ppx while on high dose prednisone and can stop when prednisone dose reaches 20 mg -s/p abx for CAP -IS, mobility -wean O2 as able, goal > 88% -brovana, budesonide, yupelri; resume bevespi on discharge -Please ambulate and document O2 flow needed with exertion and ensure she has adequate O2 supplies, tanks to meet this demand prior to d/c  PCCM will sign off and send  message for clinic f/u  Best Practice (right click and "Reselect all SmartList Selections" daily)   Per primary   Labs   CBC: Recent Labs  Lab 02/02/22 0249 02/03/22 0240 02/04/22 0255 02/05/22 0530 02/06/22 0318  WBC 17.1* 19.3* 17.9* 19.6* 18.8*  HGB 10.9* 11.5* 11.1* 11.4* 11.1*  HCT 36.1 38.2 36.6 37.8 36.7  MCV 90.7 90.5 90.8 90.0 90.4  PLT 374 387 329 337 287     Basic  Metabolic Panel: Recent Labs  Lab 02/01/22 1239 02/02/22 0249 02/03/22 0240 02/04/22 0255 02/05/22 0530 02/06/22 0318 02/07/22 0416 02/08/22 0317  NA 142 145 142 141 140 142 141 138  K 3.5 3.9 3.4* 4.0 3.7 3.7 4.3 3.5  CL 103 103 102 101 101 101 100 97*  CO2 31 32 32 33* 32 35* 35* 34*  GLUCOSE 209* 123* 128* 144* 115* 117* 104* 111*  BUN 22 25* 32* 30* 36* 34* 33* 27*  CREATININE 0.83 1.00 1.00 0.98 0.97 0.91 1.06* 0.92  CALCIUM 8.6* 8.4* 8.4* 8.3* 8.4* 8.2* 8.4* 8.1*  MG 2.6* 2.8* 3.0*  --   --   --   --   --   PHOS 2.7 4.1  --   --   --   --   --   --     GFR: Estimated Creatinine Clearance: 57.4 mL/min (by C-G formula based on SCr of 0.92 mg/dL). Recent Labs  Lab 02/03/22 0240 02/04/22 0255 02/05/22 0530 02/06/22 0318  WBC 19.3* 17.9* 19.6* 18.8*     Liver Function Tests: Recent Labs  Lab 02/03/22 0240 02/04/22 0255 02/05/22 0530 02/06/22 0318 02/07/22 0416  AST 202* 134* 116* 90* 116*  ALT 422* 366* 335* 293* 322*  ALKPHOS 97 81 73 74 76  BILITOT 0.4 0.4 0.4 0.5 0.4  PROT 6.6 6.2* 5.9* 5.6* 5.4*  ALBUMIN 2.9* 2.8* 2.5* 2.5* 2.4*    No results for input(s): "LIPASE", "AMYLASE" in the last 168 hours. No results for input(s): "AMMONIA" in the last 168 hours.  ABG    Component Value Date/Time   PHART 7.48 (H) 01/28/2022 1544   PCO2ART 54 (H) 01/28/2022 1544   PO2ART 122 (H) 01/28/2022 1544   HCO3 40.2 (H) 01/28/2022 1544   TCO2 33 (H) 01/28/2022 1624   ACIDBASEDEF 1.0 07/07/2010 1023   O2SAT 100 01/28/2022 1544     Coagulation Profile: No results for input(s): "INR", "PROTIME" in the last 168 hours.  Cardiac Enzymes: No results for input(s): "CKTOTAL", "CKMB", "CKMBINDEX", "TROPONINI" in the last 168 hours.  HbA1C: Hgb A1c MFr Bld  Date/Time Value Ref Range Status  02/02/2022 02:49 AM 6.4 (H) 4.8 - 5.6 % Final    Comment:    (NOTE)         Prediabetes: 5.7 - 6.4         Diabetes: >6.4         Glycemic control for adults with diabetes:  <7.0   10/25/2020 09:35 AM 6.2 (H) 4.8 - 5.6 % Final    Comment:             Prediabetes: 5.7 - 6.4          Diabetes: >6.4          Glycemic control for adults with diabetes: <7.0     CBG: No results for input(s): "GLUCAP" in the last 168 hours. 02/08/2022, 12:12 PM   Lanier Clam, MD [facility] See Amion for contact info  If no  response, please call [phone] until 7 pm After 7:00 pm call Elink  [phone]   02/08/2022

## 2022-02-08 NOTE — TOC Progression Note (Signed)
Transition of Care [facility]) - Progression Note    Patient Details  Name: [name] MRN: [id number] Date of Birth: [birth year]  Transition of Care [facility]) CM/SW [place], LCSW Phone Number: 02/08/2022, 1:36 PM  Clinical Narrative:    CSW contacted kristin with [employer] in regards to a smaller portable tank. Will need orders for portable tank evaluation.MD made aware.   Expected Discharge Plan: Home/Self Care Barriers to Discharge: Continued Medical Work up  Expected Discharge Plan and Services   Discharge Planning Services: CM Consult   Living arrangements for the past 2 months: [place] Determinants of Health (SDOH) Interventions SDOH Screenings   Food Insecurity: No Food Insecurity (01/28/2022)  Housing: Low Risk  (01/28/2022)  Transportation Needs: No Transportation Needs (01/28/2022)  Utilities: Not At Risk (01/28/2022)  Alcohol Screen: Low Risk  (01/28/2022)  Depression (PHQ2-9): Low Risk  (01/28/2022)  Financial Resource Strain: Low Risk  (01/28/2022)  Physical Activity: Inactive (01/28/2022)  Social Connections: Socially Isolated (01/28/2022)  Stress: No Stress Concern Present (01/28/2022)  Tobacco Use: Medium Risk (01/28/2022)    Readmission Risk Interventions    01/30/2022    4:41 PM  Readmission Risk Prevention Plan  Transportation Screening Complete  PCP or Specialist Appt within 3-5 Days Complete  HRI or Home Care Consult Complete  Palliative Care Screening Not Applicable  Medication Review (RN Care Manager) Complete

## 2022-02-08 NOTE — Progress Notes (Signed)
Pt's husband came up to unit, post discharge. Stated pharmacy would not fill RX, for prednisone & unsure as to why. Documenting nurse called [facility] [phone]. Spoke with pharmacists, who stated they needed a total quantity for number needed. Secure chat sent to Dr. Verlon Au, who responded with 204 tablets needed. Called Alex back (phamacists @ [facility], informed of MD's requested quantity of 204. Alex stated that was all they needed.

## 2022-02-08 NOTE — Plan of Care (Signed)

## 2022-02-08 NOTE — Progress Notes (Signed)
Discharge instructions provided, with details r/t prednisone tapering written out with dates on [facility].  Pt verbalized understanding of all information. Attached pt's [place] to oxygen tank @ 5 L. Pt's walker delivered & family took home. Pt was wheeled to car for transportation home without event.

## 2022-02-08 NOTE — Care Management Important Message (Signed)
Important Message  Patient Details IM Letter placed in room. Name: [name] MRN: [id number] Date of Birth: 21-Dec-1945   Medicare Important Message Given:  Yes     Kerin Salen 02/08/2022, 12:27 PM

## 2022-02-08 NOTE — Progress Notes (Signed)
Physical Therapy Treatment Patient Details Name: [name] MRN: [id number] DOB: 03-21-1945 Today's Date: 02/08/2022   History of Present Illness 76 year old F with PMH of COPD/chronic hypoxic RF on 2 to 3 L, NSCLC of left lung s/p chemoradiation currently on immunotherapy, paroxysmal A-fib, amyloid angiopathy of brain, hypothyroidism, hyperlipidemia and tobacco use disorder presented to to ED 01/28/22 with lightheadedness, respiratory failure with hypoxia with oxygen saturation in the range of 70 to 78%, required NRB    PT Comments    Pt seen for PT tx with pt agreeable. Spent significant time discussing d/c with pt requesting smaller portable continuous O2 tank that fits in bag. Discussed use of rollator vs transport chair vs scooters in store for public use; pt defers DME at this time & plans to use equipment available in the community. Discussed energy conservation techniques when out at stores in the community. Also educated her on potential benefits of pulmonary rehab -- PT initiated this conversation with MD & will defer to them re: final decision. Pt is able to ambulate in hallway without AD with mod I with slow, steady gait speed (pt does elect to hold to rail at times out of "habit") & no overt LOB noted. Anticipate pt can d/c home with assistance from family PRN.  On 4.5L/min when connected to wall, 6L/min when ambulatory, lowest SpO2 during gait was 85%, upon return to room was 81%. Pt with good awareness of when O2 drops 2/2 symptoms (pt reports feeling lightheaded during those times) & use of pulse ox on finger. Pt takes standing breaks PRN to allow SpO2 to increase to >/= 88%.     Recommendations for follow up therapy are one component of a multi-disciplinary discharge planning process, led by the attending physician.  Recommendations may be updated based on patient status, additional functional criteria and insurance authorization.  Follow Up Recommendations  No PT follow  up     Assistance Recommended at Discharge PRN  Patient can return home with the following A little help with bathing/dressing/bathroom;Help with stairs or ramp for entrance;Assist for transportation;Assistance with cooking/housework   Equipment Recommendations  None recommended by PT    Recommendations for Other Services       Precautions / Restrictions Precautions Precautions: Fall Precaution Comments: monitor O2 Restrictions Weight Bearing Restrictions: No     Mobility  Bed Mobility               General bed mobility comments: Not tested, pt received & lef in recliner    Transfers Overall transfer level: Modified independent Equipment used: None Transfers: Sit to/from Stand Sit to Stand: Modified independent (Device/Increase time)                Ambulation/Gait Ambulation/Gait assistance: Modified independent (Device/Increase time) Gait Distance (Feet): 120 Feet Assistive device: None Gait Pattern/deviations: Decreased step length - right, Decreased step length - left, Decreased stride length Gait velocity: decreased     General Gait Details: ~4-5 standing rest breaks 2/2 decreased SpO2   Stairs             Wheelchair Mobility    Modified Rankin (Stroke Patients Only)       Balance Overall balance assessment: Needs assistance Sitting-balance support: Feet supported Sitting balance-Leahy Scale: Good     Standing balance support: During functional activity, No upper extremity supported Standing balance-Leahy Scale: Fair  Cognition Arousal/Alertness: Awake/alert Behavior During Therapy: WFL for tasks assessed/performed Overall Cognitive Status: Within Functional Limits for tasks assessed                                 General Comments: Pt with good insight & understanding of situation & education.        Exercises Other Exercises Other Exercises: Educated pt on use of  incentive spirometer & acapella flutter valve, encouraged pt to use both & educated her on purpose of each one.    General Comments        Pertinent Vitals/Pain Pain Assessment Pain Assessment: No/denies pain    Home Living                          Prior Function            PT Goals (current goals can now be found in the care plan section) Acute Rehab PT Goals Patient Stated Goal: decrease supplemental O2 needs PT Goal Formulation: With patient Time For Goal Achievement: 02/14/22 Potential to Achieve Goals: Good Progress towards PT goals: Progressing toward goals    Frequency    Min 3X/week      PT Plan Current plan remains appropriate    Co-evaluation              AM-PAC PT "6 Clicks" Mobility   Outcome Measure  Help needed turning from your back to your side while in a flat bed without using bedrails?: None Help needed moving from lying on your back to sitting on the side of a flat bed without using bedrails?: None Help needed moving to and from a bed to a chair (including a wheelchair)?: None Help needed standing up from a chair using your arms (e.g., wheelchair or bedside chair)?: None Help needed to walk in hospital room?: None Help needed climbing 3-5 steps with a railing? : A Little 6 Click Score: 23    End of Session Equipment Utilized During Treatment: Oxygen Activity Tolerance: Patient tolerated treatment well Patient left: in chair;with call bell/phone within reach Nurse Communication: Mobility status PT Visit Diagnosis: Difficulty in walking, not elsewhere classified (R26.2)     Time: 4158-3094 PT Time Calculation (min) (ACUTE ONLY): 38 min  Charges:  $Therapeutic Activity: 38-52 mins                     Lavone Nian, PT, DPT 02/08/22, 12:49 PM   Waunita Schooner 02/08/2022, 12:45 PM

## 2022-02-08 NOTE — Discharge Summary (Signed)
Physician Discharge Summary  GENOLA YUILLE BJY:[id number] DOB: August 08, 1945 DOA: 01/28/2022  PCP: [name], NP  Admit date: 01/28/2022 Discharge date: 02/08/2022  Time spent: 30 minutes  Recommendations for Outpatient Follow-up:  Requires Chem-7 CBC and magnesium 1 week Requires oxygen with portable small tank to be delivered as well as rollator No tapering doses of steroids and initiation of Bactrim for PJP prophylaxis-please follow and ensure that she discontinues PJP prophylaxis as per meds below Will need outpatient follow-up with pulmonology/oncology to address breathing issues as well as lung cancer  Discharge Diagnoses:  MAIN problem for hospitalization   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 NSCLC stage IIIc status post lobectomy on immunotherapy which has been now held Paroxysmal A-fib Hypothyroidism HTN Chronic hypoxic respiratory failure now requiring up to 6 L of oxygen with mobilization  Please see below for itemized issues addressed in [facility]psital- refer to other progress notes for clarity if needed  Discharge Condition:  Improved  Diet recommendation:  Heart healthy  Filed Weights   02/05/22 0403 02/06/22 0533 02/07/22 0500  Weight: 93.1 kg 93.8 kg 93.5 kg    History of present illness:   76 year old female COPD 3.5 L O2-continue tobacco Prior MI unclear Stage IIIc NSCLC L lung s/p LUL lobectomy 08/2010-XRT 12/22 for recurrence-sees Dr. Earlie Server  P A-fib developing 08/11/2021 no AC 2/2 amyloid angiopathy brain Hypothyroid HTN HLD   Last hospitalization 6/21-6/24/2023--chest tube placed 6/10 [facility] for malignant pleural effusion--developed further shortness of breath and admitted to [facility] found to have SVT new A-fib was not discharged on anticoagulation as above   Represented 01/28/2022 lightheadedness wooziness with exertion-requiring more oxygen up to 6 L as opposed to usual 2-3 and sent to emergency room for  evaluation--chest CT showed groundglass opacities small pleural effusion and this was felt to be pneumonia-was placed on 10 L high flow nasal cannula   Immunotherapy was held patient was given both antibiotics as well as Lasix--ultimately felt this could be immune mediated pneumonitis   12/11 admit for hypoxia. Azithro rocephin, steroids, lasix 12/12 onc consult. Steroids increased  12/15 persistently elevated O2 req -- 10 HFNC, PCCM consulted  12/16 amiodarone stopped  Hospital Course:   cute superimposed on chronic respiratory failure baseline 3.5 L-etiology?  Pneumonia versus amiodarone versus spread of lymphangitic NSCLC -Needing up to 10 L previously now better and slept 5 to 6 L range-on walking around on day of discharge desats with activity down to 85% but needs frequent breaks and can discharge on 6 L as per pulmonology who reviewed her on day of discharge -will continue Lasix at prior to admission doses as does not seem to be any component of her issues in terms of breathing - Had opacities in lung unclear etiology completed azithromycin and ceftriaxone 12/17 - Solu-Medrol changed to prednisone 60 on 12/21 - Tapering dose of steroids as per pulmonology who saw her as well as PJP prophylaxis with Bactrim Monday Wednesday Friday and she should discontinue Bactrim once the dose of prednisone is 20 mg - She will need outpatient follow-up with regards to this and decision about tapering further off steroids   COPD GOLD stg iii Still smoker  NSCLC stage IIIc -Meds have been changed by pulmonary to include Yupelri, DuoNeb, guaifenesin, budesonide, Brovana - I have added albuterol -Outpatient follow-up with Dr. Earlie Server as well as pulmonology   Paroxysmal A-fib-in sinus Not on anticoagulation secondary to cerebral angiopathy needs outpatient physical therapy - Continue metoprolol 100 daily XL  is in sinus today can discontinue monitors   Discharge Exam: Vitals:   02/08/22 0931  02/08/22 1401  BP:  106/65  Pulse:  82  Resp:  20  Temp:  99 F (37.2 C)  SpO2: 94% 91%    Subj on day of d/c   Awake coherent no distress ambulated earlier today Discussed with therapy and social work  General Exam on discharge  Chest is clear no added sound no rales rhonchi, ROM intact moving 4 limbs equally Thick neck No lymphadenopathy no thyromegaly Abdomen soft no rebound no guarding Neuro intact   Discharge Instructions   Discharge Instructions     Diet - low sodium heart healthy   Complete by: As directed    Discharge instructions   Complete by: As directed    We will arange a prtable oxgen for you Look at your steroid doses carefully--u will need to taper the doses slowly--you will also need Bactrim 2 times a week as when u ar eon steroids this can be risky and predispose you to infection Please get all the inhalers Make sure that u get labs in ~ 1 week and follow with your primary Hopefully your lungs will improve with time Bes tof lukc   Increase activity slowly   Complete by: As directed       Allergies as of 02/08/2022       Reactions   Codeine Other (See Comments)   Unknown reaction Patient avoids this medication        Medication List     STOP taking these medications    amiodarone 100 MG tablet Commonly known as: PACERONE   Bevespi Aerosphere 9-4.8 MCG/ACT Aero Generic drug: Glycopyrrolate-Formoterol   OXYGEN       TAKE these medications    acetaminophen 500 MG tablet Commonly known as: TYLENOL Take 500 mg by mouth every 6 (six) hours as needed for moderate pain or fever.   albuterol (2.5 MG/3ML) 0.083% nebulizer solution Commonly known as: PROVENTIL Take 2.5 mg by nebulization 3 (three) times daily as needed for wheezing or shortness of breath. What changed: Another medication with the same name was changed. Make sure you understand how and when to take each.   albuterol 108 (90 Base) MCG/ACT inhaler Commonly known as:  ProAir HFA 2 puffs up to every 4 hours as needed only  if your can't catch your breath What changed:  how much to take how to take this when to take this reasons to take this additional instructions   arformoterol 15 MCG/2ML Nebu Commonly known as: BROVANA Take 2 mLs (15 mcg total) by nebulization 2 (two) times daily.   aspirin 81 MG tablet Take 81 mg by mouth at bedtime.   budesonide 0.5 MG/2ML nebulizer solution Commonly known as: PULMICORT Take 2 mLs (0.5 mg total) by nebulization 2 (two) times daily.   CO Q 10 PO Take 1 capsule by mouth daily.   furosemide 20 MG tablet Commonly known as: LASIX Take 1 tablet po BID for 5 days then go back to taking 1 tablet po QD What changed:  how much to take how to take this when to take this additional instructions   ipratropium-albuterol 0.5-2.5 (3) MG/3ML Soln Commonly known as: DUONEB Take 3 mLs by nebulization every 4 (four) hours as needed.   Klor-Con M10 10 MEQ tablet Generic drug: potassium chloride TAKE 1 TABLET BY MOUTH EVERY DAY What changed: when to take this   levothyroxine 25 MCG tablet Commonly known  as: SYNTHROID Take 0.5 tablets (12.5 mcg total) by mouth daily. In addition to the 50 mcg.   levothyroxine 50 MCG tablet Commonly known as: SYNTHROID Take 1 tablet (50 mcg total) by mouth See admin instructions. Along with 12.5 mcg qd except on Sunday (pt just takes 50 mcg)   lidocaine-prilocaine cream Commonly known as: EMLA Apply 1 Application topically as needed. What changed: reasons to take this   metoprolol succinate 100 MG 24 hr tablet Commonly known as: TOPROL-XL Take 1 tablet (100 mg total) by mouth daily. Take with or immediately following a meal.   naproxen sodium 220 MG tablet Commonly known as: ALEVE Take 220 mg by mouth 2 (two) times daily as needed (pain).   OMEGA-3 PO Take 1,000 mg by mouth in the morning and at bedtime.   predniSONE 10 MG tablet Commonly known as: DELTASONE Take 5  tablets (50 mg total) by mouth daily for 7 days, THEN 4 tablets (40 mg total) daily for 7 days, THEN 3 tablets (30 mg total) daily for 7 days, THEN 2 tablets (20 mg total) daily. Start taking on: February 08, 2022   revefenacin 175 MCG/3ML nebulizer solution Commonly known as: YUPELRI Take 3 mLs (175 mcg total) by nebulization daily. Start taking on: February 09, 2022   rosuvastatin 40 MG tablet Commonly known as: CRESTOR Take 1 tablet (40 mg total) by mouth daily.   sulfamethoxazole-trimethoprim 800-160 MG tablet Commonly known as: BACTRIM DS Take 1 tablet by mouth every Monday, Wednesday, and Friday for 21 days.               Durable Medical Equipment  (From admission, onward)           Start     Ordered   02/08/22 1359  For home use only DME 4 wheeled rolling walker with seat  Once       Question:  Patient needs a walker to treat with the following condition  Answer:  General weakness   02/08/22 1358   02/08/22 1338  DME Oxygen  Once       Question Answer Comment  Length of Need Lifetime   Mode or (Route) Nasal cannula   Liters per Minute 6   Frequency Continuous (stationary and portable oxygen unit needed)   Oxygen conserving device Yes   Oxygen delivery system Gas      02/08/22 1339   02/05/22 1720  For home use only DME Walker rolling  Once       Question Answer Comment  Walker: Other   Comments rollator   Patient needs a walker to treat with the following condition Difficulty in walking, not elsewhere classified      12 /19/23 1721           Allergies  Allergen Reactions   Codeine Other (See Comments)    Unknown reaction Patient avoids this medication    Follow-up Information     Fenton, Clint R, PA Follow up on 02/19/2022.   Specialty: Cardiology Why: at 3:00 pm  this is the atrial fib clinic  call for parking instructions Contact information: [place] [address] [phone]                  The results of  significant diagnostics from this hospitalization (including imaging, microbiology, ancillary and laboratory) are listed below for reference.    Significant Diagnostic Studies: US Abdomen Limited RUQ (LIVER/GB)  Result Date: 02/04/2022 CLINICAL DATA:  Elevated LFTs EXAM: ULTRASOUND  ABDOMEN LIMITED RIGHT UPPER QUADRANT COMPARISON:  None Available. FINDINGS: Gallbladder: Nondilated gallbladder. Cholelithiasis with significant shadowing. No wall thickening. Negative sonographic Murphy sign. Largest gallstone measures up to 1.2 cm. Common bile duct: Diameter: 6.1 mm, normal.  No intrahepatic ductal dilation. Liver: Normal echogenicity. There is a left hepatic cyst measuring up to 1.2 cm and a right hepatic cyst measuring up to 1.0 cm, as seen on recent CT. Portal vein is patent on color Doppler imaging with normal direction of blood flow towards the liver. Other: None. IMPRESSION: Cholelithiasis. No evidence of acute cholecystitis or biliary obstruction. Unremarkable sonographic appearance of the liver. Electronically Signed   By: Maurine Simmering M.D.   On: 02/04/2022 20:57   DG CHEST PORT 1 VIEW  Result Date: 02/02/2022 CLINICAL DATA:  Respiratory failure EXAM: PORTABLE CHEST 1 VIEW COMPARISON:  01/28/2022 chest x-ray and CT, chest x-ray 08/07/2021 FINDINGS: Right-sided central venous port tip over the SVC. Post sternotomy changes. Postsurgical changes on the left. Pleural effusion and airspace disease are grossly unchanged. Improved aeration of right thorax compared to prior. Enlarged cardiomediastinal silhouette IMPRESSION: Improved aeration of the right thorax compared to prior. Pleural effusion and airspace disease on the left are grossly unchanged. Electronically Signed   By: Donavan Foil M.D.   On: 02/02/2022 17:07   CT Angio Chest PE W/Cm &/Or Wo Cm  Result Date: 01/28/2022 CLINICAL DATA:  Pulmonary embolism (PE) suspected, high prob. History of non-small cell lung cancer EXAM: CT ANGIOGRAPHY CHEST  WITH CONTRAST TECHNIQUE: Multidetector CT imaging of the chest was performed using the standard protocol during bolus administration of intravenous contrast. Multiplanar CT image reconstructions and MIPs were obtained to evaluate the vascular anatomy. RADIATION DOSE REDUCTION: This exam was performed according to the departmental dose-optimization program which includes automated exposure control, adjustment of the mA and/or kV according to patient size and/or use of iterative reconstruction technique. CONTRAST:  16mL OMNIPAQUE IOHEXOL 350 MG/ML SOLN COMPARISON:  01/07/2022 FINDINGS: Cardiovascular: Satisfactory opacification of the pulmonary arteries. No evidence of pulmonary embolism to the lobar branch level. Evaluation of the more distal pulmonary arterial branches is limited in the setting of respiratory motion artifact. Thoracic aorta is nonaneurysmal. Scattered atherosclerotic vascular calcifications of the aorta and coronary arteries. Prior sternotomy and CABG normal heart size. No pericardial effusion. Right chest port terminates at the level of the proximal right atrium. Mediastinum/Nodes: Prominent soft tissue in the left perihilar region is unchanged. Multiple mildly prominent mediastinal and right hilar lymph nodes have slightly increased the previous study, and may be reactive. No axillary lymphadenopathy. Thyroid, trachea, and esophagus demonstrate no significant findings. Lungs/Pleura: Prior left upper lobectomy with associated volume loss in the left hemithorax. Persistent irregular appearing small left pleural effusion. Progressive interstitial and alveolar opacities throughout the left lower lobe. There are new predominantly ground-glass opacities with interlobular septal thickening throughout the right lung. No right-sided pleural effusion. No pneumothorax. Upper Abdomen: No acute findings within the included upper abdomen. Musculoskeletal: No chest wall abnormality. No acute or significant  osseous findings. Review of the MIP images confirms the above findings. IMPRESSION: 1. No evidence of pulmonary embolism to the lobar branch level. 2. New predominantly ground-glass opacities with interlobular septal thickening throughout the right lung, which may represent pulmonary edema or atypical infection. 3. Post-treatment changes from prior left upper lobectomy. Persistent irregular appearing small left pleural effusion. Progressive interstitial and alveolar opacities throughout the left lower lobe, which may represent edema, infection, versus lymphangitic spread of tumor. 4. Multiple mildly  prominent mediastinal and right hilar lymph nodes have slightly increased the previous study, and may be reactive or metastatic. Attention on follow-up. 5. Aortic and coronary artery atherosclerosis (ICD10-I70.0). Electronically Signed   By: Davina Poke D.O.   On: 01/28/2022 19:43   DG Chest Port 1 View  Result Date: 01/28/2022 CLINICAL DATA:  Pt arrived via POV, c/o low oxygen. States had follow up in office, told oxygen was mid 31s. Pt states she had her oxygen increased to 5L  at home recently. EXAM: PORTABLE CHEST - 1 VIEW COMPARISON:  08/29/2021 FINDINGS: Interval decrease in left pleural effusion. Improved aeration of the left lung base although moderate scattered airspace opacities persist. There has been some increase in ill-defined alveolar opacities at the right lung base. Stable right IJ power port to the distal SVC. CABG markers. Heart size and mediastinal contours are within normal limits. Aortic Atherosclerosis (ICD10-170.0). No pneumothorax. Sternotomy wires. IMPRESSION: 1. Interval decrease in left pleural effusion. 2. Worsening right lower lung airspace disease. Electronically Signed   By: Lucrezia Europe M.D.   On: 01/28/2022 16:19   ECHOCARDIOGRAM COMPLETE  Result Date: 01/21/2022    ECHOCARDIOGRAM REPORT   Patient Name:   CIENNA DUMAIS Date of Exam: 01/21/2022 Medical Rec #:  [id number]          Height:       64.0 in Accession #:    [id number]        Weight:       216.0 lb Date of Birth:  February 08, 1946         BSA:          2.021 m Patient Age:    76 years          BP:           116/68 mmHg Patient Gender: F                 HR:           90 bpm. Exam Location:  [facility] Procedure: 2D Echo, Cardiac Doppler and Color Doppler Indications:    I25.5 Ischemic cardiomyopathy  History:        Patient has prior history of Echocardiogram examinations, most                 recent 11/14/2020. CHF, CAD and Previous Myocardial Infarction,                 Prior CABG, Arrythmias:Atrial Fibrillation; Risk                 Factors:Hypertension, Dyslipidemia and Former Smoker. Lower                 extremity edema. Lung cancer. Radiation therapy. Anemia.                 Obesity.  Sonographer:    Diamond Nickel RCS Referring Phys: Viola  1. Difficult study due to poor visualization of LV endocardium.  2. Left ventricular ejection fraction, by estimation, is 55 to 60%. The left ventricle has normal function. Left ventricular endocardial border not optimally defined to evaluate regional wall motion. Left ventricular diastolic parameters are consistent with Grade I diastolic dysfunction (impaired relaxation). The average left ventricular global longitudinal strain is -18.1 %. The global longitudinal strain is normal.  3. Right ventricular systolic function is mildly reduced. The right ventricular size is normal. Tricuspid regurgitation signal is inadequate for assessing PA pressure.  4. The mitral  valve is grossly normal. Trivial mitral valve regurgitation.  5. The aortic valve was not well visualized. Aortic valve regurgitation is not visualized. No aortic stenosis is present.  6. The inferior vena cava is normal in size with greater than 50% respiratory variability, suggesting right atrial pressure of 3 mmHg. Comparison(s): Compared to prior TTE in 10/2020, the EF appears slightly improved from  50-55% to 55%. Otherwise, there is no significant change. FINDINGS  Left Ventricle: Left ventricular ejection fraction, by estimation, is 55 to 60%. The left ventricle has normal function. Left ventricular endocardial border not optimally defined to evaluate regional wall motion. The average left ventricular global longitudinal strain is -18.1 %. The global longitudinal strain is normal. The global longitudinal strain is normal despite suboptimal segment tracking. The left ventricular internal cavity size was normal in size. There is no left ventricular hypertrophy. Left ventricular diastolic parameters are consistent with Grade I diastolic dysfunction (impaired relaxation). Right Ventricle: The right ventricular size is normal. Right vetricular wall thickness was not well visualized. Right ventricular systolic function is mildly reduced. Tricuspid regurgitation signal is inadequate for assessing PA pressure. Left Atrium: Left atrial size was normal in size. Right Atrium: Right atrial size was normal in size. Pericardium: There is no evidence of pericardial effusion. Mitral Valve: The mitral valve is grossly normal. There is mild thickening of the mitral valve leaflet(s). There is mild calcification of the mitral valve leaflet(s). Trivial mitral valve regurgitation. Tricuspid Valve: The tricuspid valve is normal in structure. Tricuspid valve regurgitation is trivial. Aortic Valve: The aortic valve was not well visualized. Aortic valve regurgitation is not visualized. No aortic stenosis is present. Pulmonic Valve: The pulmonic valve was not well visualized. Pulmonic valve regurgitation is not visualized. Aorta: The aortic root and ascending aorta are structurally normal, with no evidence of dilitation. Venous: The inferior vena cava is normal in size with greater than 50% respiratory variability, suggesting right atrial pressure of 3 mmHg. IAS/Shunts: The atrial septum is grossly normal.  LEFT VENTRICLE PLAX 2D  LVIDd:         5.10 cm   Diastology LVIDs:         2.90 cm   LV e' medial:    7.62 cm/s LV PW:         0.90 cm   LV E/e' medial:  11.4 LV IVS:        0.90 cm   LV e' lateral:   8.49 cm/s LVOT diam:     1.90 cm   LV E/e' lateral: 10.2 LV SV:         58 LV SV Index:   29        2D Longitudinal Strain LVOT Area:     2.84 cm  2D Strain GLS Avg:     -18.1 %  RIGHT VENTRICLE RV S prime:     6.96 cm/s TAPSE (M-mode): 1.1 cm LEFT ATRIUM             Index        RIGHT ATRIUM           Index LA diam:        3.30 cm 1.63 cm/m   RA Area:     15.00 cm LA Vol (A2C):   41.4 ml 20.48 ml/m  RA Volume:   35.10 ml  17.36 ml/m LA Vol (A4C):   25.7 ml 12.71 ml/m LA Biplane Vol: 33.3 ml 16.47 ml/m  AORTIC VALVE LVOT Vmax:   98.60 cm/s  LVOT Vmean:  59.900 cm/s LVOT VTI:    0.204 m  AORTA Ao Root diam: 2.60 cm Ao Asc diam:  3.10 cm MITRAL VALVE MV Area (PHT): 3.27 cm     SHUNTS MV Decel Time: 232 msec     Systemic VTI:  0.20 m MV E velocity: 87.00 cm/s   Systemic Diam: 1.90 cm MV A velocity: 111.00 cm/s MV E/A ratio:  0.78 Gwyndolyn Kaufman MD Electronically signed by Gwyndolyn Kaufman MD Signature Date/Time: 01/21/2022/4:45:45 PM    Final     Microbiology: Recent Results (from the past 240 hour(s))  Respiratory (~20 pathogens) panel by PCR     Status: None   Collection Time: 02/01/22  1:04 PM   Specimen: Nasopharyngeal Swab; Respiratory  Result Value Ref Range Status   Adenovirus NOT DETECTED NOT DETECTED Final   Coronavirus 229E NOT DETECTED NOT DETECTED Final    Comment: (NOTE) The Coronavirus on the Respiratory Panel, DOES NOT test for the novel  Coronavirus (2019 nCoV)    Coronavirus HKU1 NOT DETECTED NOT DETECTED Final   Coronavirus NL63 NOT DETECTED NOT DETECTED Final   Coronavirus OC43 NOT DETECTED NOT DETECTED Final   Metapneumovirus NOT DETECTED NOT DETECTED Final   Rhinovirus / Enterovirus NOT DETECTED NOT DETECTED Final   Influenza A NOT DETECTED NOT DETECTED Final   Influenza B NOT DETECTED NOT  DETECTED Final   Parainfluenza Virus 1 NOT DETECTED NOT DETECTED Final   Parainfluenza Virus 2 NOT DETECTED NOT DETECTED Final   Parainfluenza Virus 3 NOT DETECTED NOT DETECTED Final   Parainfluenza Virus 4 NOT DETECTED NOT DETECTED Final   Respiratory Syncytial Virus NOT DETECTED NOT DETECTED Final   Bordetella pertussis NOT DETECTED NOT DETECTED Final   Bordetella Parapertussis NOT DETECTED NOT DETECTED Final   Chlamydophila pneumoniae NOT DETECTED NOT DETECTED Final   Mycoplasma pneumoniae NOT DETECTED NOT DETECTED Final    Comment: Performed at [facility], [address] [place], [place] [address]     Labs: Basic Metabolic Panel: Recent Labs  Lab 02/02/22 0249 02/03/22 0240 02/04/22 0255 02/05/22 0530 02/06/22 0318 02/07/22 0416 02/08/22 0317  NA 145 142 141 140 142 141 138  K 3.9 3.4* 4.0 3.7 3.7 4.3 3.5  CL 103 102 101 101 101 100 97*  CO2 32 32 33* 32 35* 35* 34*  GLUCOSE 123* 128* 144* 115* 117* 104* 111*  BUN 25* 32* 30* 36* 34* 33* 27*  CREATININE 1.00 1.00 0.98 0.97 0.91 1.06* 0.92  CALCIUM 8.4* 8.4* 8.3* 8.4* 8.2* 8.4* 8.1*  MG 2.8* 3.0*  --   --   --   --   --   PHOS 4.1  --   --   --   --   --   --    Liver Function Tests: Recent Labs  Lab 02/03/22 0240 02/04/22 0255 02/05/22 0530 02/06/22 0318 02/07/22 0416  AST 202* 134* 116* 90* 116*  ALT 422* 366* 335* 293* 322*  ALKPHOS 97 81 73 74 76  BILITOT 0.4 0.4 0.4 0.5 0.4  PROT 6.6 6.2* 5.9* 5.6* 5.4*  ALBUMIN 2.9* 2.8* 2.5* 2.5* 2.4*   No results for input(s): "LIPASE", "AMYLASE" in the last 168 hours. No results for input(s): "AMMONIA" in the last 168 hours. CBC: Recent Labs  Lab 02/02/22 0249 02/03/22 0240 02/04/22 0255 02/05/22 0530 02/06/22 0318  WBC 17.1* 19.3* 17.9* 19.6* 18.8*  HGB 10.9* 11.5* 11.1* 11.4* 11.1*  HCT 36.1 38.2 36.6 37.8 36.7  MCV  90.7 90.5 90.8 90.0 90.4  PLT 374 387 329 337 287   Cardiac Enzymes: No results for input(s): "CKTOTAL", "CKMB", "CKMBINDEX",  "TROPONINI" in the last 168 hours. BNP: BNP (last 3 results) Recent Labs    08/08/21 2154 01/28/22 1544 02/01/22 1239  BNP 360.1* 32.2 252.8*    ProBNP (last 3 results) No results for input(s): "PROBNP" in the last 8760 hours.  CBG: No results for input(s): "GLUCAP" in the last 168 hours.     Signed:  Nita Sells MD   [facility] 02/08/2022, 2:11 PM

## 2022-02-12 ENCOUNTER — Telehealth: Payer: Self-pay

## 2022-02-12 ENCOUNTER — Inpatient Hospital Stay: Payer: Medicare HMO | Admitting: Physician Assistant

## 2022-02-12 ENCOUNTER — Telehealth: Payer: Self-pay | Admitting: Internal Medicine

## 2022-02-12 ENCOUNTER — Inpatient Hospital Stay: Payer: Medicare HMO

## 2022-02-12 NOTE — Telephone Encounter (Signed)
Pt has upcoming appt 1/3.

## 2022-02-12 NOTE — Patient Outreach (Signed)
  Care Coordination TOC Note Transition Care Management Follow-up Telephone Call Date of discharge and from where: [facility] 01/28/22-02/08/22 How have you been since you were released from the hospital? "I am feeling okay" Any questions or concerns? No  Items Reviewed: Did the pt receive and understand the discharge instructions provided? Yes  Medications obtained and verified? Yes  Other? No  Any new allergies since your discharge? No  Dietary orders reviewed? No Do you have support at home? Yes   Home Care and Equipment/Supplies: Were home health services ordered? no If so, what is the name of the agency? N/a  Has the agency set up a time to come to the patient's home? no Were any new equipment or medical supplies ordered?  No What is the name of the medical supply agency? N/a Were you able to get the supplies/equipment? not applicable Do you have any questions related to the use of the equipment or supplies? No  Functional Questionnaire: (I = Independent and D = Dependent) ADLs: I  Bathing/Dressing- I  Meal Prep- I  Eating- I  Maintaining continence- I  Transferring/Ambulation- I  Managing Meds- I  Follow up appointments reviewed:  PCP Hospital f/u appt confirmed? No   Specialist Hospital f/u appt confirmed? Yes  Scheduled to see Dr, Julien Nordmann on 02/14/22 @ 11:30. Are transportation arrangements needed? No  If their condition worsens, is the pt aware to call PCP or go to the Emergency Dept.? Yes Was the patient provided with contact information for the PCP's office or ED? Yes Was to pt encouraged to call back with questions or concerns? Yes  SDOH assessments and interventions completed:   Yes SDOH Interventions Today    Flowsheet Row Most Recent Value  SDOH Interventions   Housing Interventions Intervention Not Indicated  Transportation Interventions Intervention Not Indicated  Financial Strain Interventions Intervention Not Indicated       Care  Coordination Interventions:  No Care Coordination interventions needed at this time.   Encounter Outcome:  Pt. Visit Completed

## 2022-02-12 NOTE — Telephone Encounter (Signed)
Called patient to notify of changed appointment dates/times. Patient notified.

## 2022-02-14 ENCOUNTER — Inpatient Hospital Stay: Payer: Medicare HMO

## 2022-02-14 ENCOUNTER — Other Ambulatory Visit: Payer: Self-pay

## 2022-02-14 ENCOUNTER — Inpatient Hospital Stay: Payer: Medicare HMO | Attending: Internal Medicine | Admitting: Internal Medicine

## 2022-02-14 VITALS — BP 103/54 | HR 72 | Temp 97.8°F | Resp 18 | Wt 210.2 lb

## 2022-02-14 DIAGNOSIS — I509 Heart failure, unspecified: Secondary | ICD-10-CM | POA: Insufficient documentation

## 2022-02-14 DIAGNOSIS — I110 Hypertensive heart disease with heart failure: Secondary | ICD-10-CM | POA: Diagnosis not present

## 2022-02-14 DIAGNOSIS — Z95828 Presence of other vascular implants and grafts: Secondary | ICD-10-CM

## 2022-02-14 DIAGNOSIS — Z902 Acquired absence of lung [part of]: Secondary | ICD-10-CM | POA: Diagnosis not present

## 2022-02-14 DIAGNOSIS — J90 Pleural effusion, not elsewhere classified: Secondary | ICD-10-CM | POA: Insufficient documentation

## 2022-02-14 DIAGNOSIS — C3492 Malignant neoplasm of unspecified part of left bronchus or lung: Secondary | ICD-10-CM | POA: Diagnosis not present

## 2022-02-14 DIAGNOSIS — Z923 Personal history of irradiation: Secondary | ICD-10-CM | POA: Insufficient documentation

## 2022-02-14 DIAGNOSIS — C3490 Malignant neoplasm of unspecified part of unspecified bronchus or lung: Secondary | ICD-10-CM

## 2022-02-14 DIAGNOSIS — Z951 Presence of aortocoronary bypass graft: Secondary | ICD-10-CM | POA: Diagnosis not present

## 2022-02-14 LAB — CMP (CANCER CENTER ONLY)
ALT: 251 U/L — ABNORMAL HIGH (ref 0–44)
AST: 89 U/L — ABNORMAL HIGH (ref 15–41)
Albumin: 3.2 g/dL — ABNORMAL LOW (ref 3.5–5.0)
Alkaline Phosphatase: 82 U/L (ref 38–126)
Anion gap: 4 — ABNORMAL LOW (ref 5–15)
BUN: 19 mg/dL (ref 8–23)
CO2: 41 mmol/L — ABNORMAL HIGH (ref 22–32)
Calcium: 9 mg/dL (ref 8.9–10.3)
Chloride: 99 mmol/L (ref 98–111)
Creatinine: 0.85 mg/dL (ref 0.44–1.00)
GFR, Estimated: >60 mL/min (ref >60)
Glucose, Bld: 91 mg/dL (ref 70–99)
Potassium: 3.9 mmol/L (ref 3.5–5.1)
Sodium: 144 mmol/L (ref 135–145)
Total Bilirubin: 0.3 mg/dL (ref 0.3–1.2)
Total Protein: 5.8 g/dL — ABNORMAL LOW (ref 6.5–8.1)

## 2022-02-14 LAB — CBC WITH DIFFERENTIAL (CANCER CENTER ONLY)
Abs Immature Granulocytes: 0.31 10*3/uL — ABNORMAL HIGH (ref 0.00–0.07)
Basophils Absolute: 0 10*3/uL (ref 0.0–0.1)
Basophils Relative: 0 %
Eosinophils Absolute: 0 10*3/uL (ref 0.0–0.5)
Eosinophils Relative: 0 %
HCT: 36.3 % (ref 36.0–46.0)
Hemoglobin: 11.3 g/dL — ABNORMAL LOW (ref 12.0–15.0)
Immature Granulocytes: 2 %
Lymphocytes Relative: 5 %
Lymphs Abs: 0.8 10*3/uL (ref 0.7–4.0)
MCH: 28 pg (ref 26.0–34.0)
MCHC: 31.1 g/dL (ref 30.0–36.0)
MCV: 89.9 fL (ref 80.0–100.0)
Monocytes Absolute: 1.3 10*3/uL — ABNORMAL HIGH (ref 0.1–1.0)
Monocytes Relative: 7 %
Neutro Abs: 16 10*3/uL — ABNORMAL HIGH (ref 1.7–7.7)
Neutrophils Relative %: 86 %
Platelet Count: 179 10*3/uL (ref 150–400)
RBC: 4.04 MIL/uL (ref 3.87–5.11)
RDW: 17.4 % — ABNORMAL HIGH (ref 11.5–15.5)
WBC Count: 18.4 10*3/uL — ABNORMAL HIGH (ref 4.0–10.5)
nRBC: 0 % (ref 0.0–0.2)

## 2022-02-14 LAB — TSH: TSH: 2.635 u[IU]/mL (ref 0.350–4.500)

## 2022-02-14 MED ORDER — SODIUM CHLORIDE 0.9% FLUSH: 10.0000 mL | INTRAVENOUS | Status: DC | PRN

## 2022-02-14 MED ORDER — HEPARIN SOD (PORK) LOCK FLUSH 100 UNIT/ML IV SOLN: 500.0000 [IU] | Freq: Once | INTRAVENOUS | Status: DC

## 2022-02-14 MED ORDER — SODIUM CHLORIDE 0.9% FLUSH
10.0000 mL | Freq: Once | INTRAVENOUS | Status: AC
  Administered 2022-02-14: 10 mL

## 2022-02-14 NOTE — Progress Notes (Signed)
[place] [phone]   [phone]  OFFICE PROGRESS NOTE  [name], NP [place] [address]  DIAGNOSIS:  Stage IIIa (T3, N2, M0) non-small cell lung cancer, squamous cell carcinoma presented with left hilar mass with suspicious mediastinal lymphadenopathy on the last CT scan of the chest at [place].  PRIOR THERAPY: A course of concurrent chemoradiation with weekly carboplatin for AUC of 2 and paclitaxel 45 Mg/M2.  Status post 6 cycles.  CURRENT THERAPY: Consolidation treatment with immunotherapy with Imfinzi 1500 Mg IV every 4 weeks.  First dose October 18, 2021.  Status post 4 cycles.  INTERVAL HISTORY: [name] 76 y.o. female returns to the clinic today for follow-up visit accompanied by her husband.  The patient continues to complain of the baseline shortness of breath and she is currently on home oxygen.  She was admitted to [facility] less than 2 weeks ago complaining of significant shortness of breath with cough.  She had imaging studies at that time including CT angiogram of the chest that showed no evidence for pulmonary embolism but there was new predominantly groundglass opacity with interlobular septal thickening throughout the right lung suspicious for pulmonary edema or atypical infection.  She also had posttreatment changes from the prior left upper lobectomy and persistent irregular appearing small left pleural effusion with progressive interstitial and alveolar opacities throughout the left lower lobe again suspicious for edema versus infection versus lymphangitic tumor spread.  The patient had multiple mildly prominent mediastinal and right hilar lymph nodes slightly increased from the previous study and likely reactive or metastatic.  She was treated for the pulmonary edema as well as suspicious pneumonia.  The patient also started high-dose prednisone because of the suspicious  drug-induced pneumonitis either from amiodarone which was discontinued or her previous treatment with Imfinzi.  She is feeling much better today.  She is currently on a tapered dose of prednisone 50 mg p.o. daily and this will be tapered over the next few weeks.  She has no current fever or chills.  She has no nausea, vomiting, diarrhea or constipation.  She is here today for reevaluation and recommendation regarding her condition.   MEDICAL HISTORY: Past Medical History:  Diagnosis Date   CAD (coronary artery disease)    Cancer ([place])    COPD (chronic obstructive pulmonary disease) ([place])    per DM note, pt denies   Dyslipidemia    GERD (gastroesophageal reflux disease)    History of radiation therapy    Left lung- 01/16/21-01/30/21- Dr. Gery Pray   History of radiation therapy    Left lung- 08/02/21-09/18/21- Dr. Gery Pray   HTN (hypertension)    Hypothyroidism    Myocardial infarct [facility])    Non-small cell lung cancer ([place]) 08/19/2010   Stage IA, status post left upper lobectomy July 2012   Tobacco abuse     ALLERGIES:  is allergic to codeine.  MEDICATIONS:  Current Outpatient Medications  Medication Sig Dispense Refill   acetaminophen (TYLENOL) 500 MG tablet Take 500 mg by mouth every 6 (six) hours as needed for moderate pain or fever.     albuterol (PROAIR HFA) 108 (90 Base) MCG/ACT inhaler 2 puffs up to every 4 hours as needed only  if your can't catch your breath (Patient taking differently: Inhale 2 puffs into the lungs every 4 (four) hours as needed for wheezing or shortness of breath.)  albuterol (PROVENTIL) (2.5 MG/3ML) 0.083% nebulizer solution Take 2.5 mg by nebulization 3 (three) times daily as needed for wheezing or shortness of breath.     arformoterol (BROVANA) 15 MCG/2ML NEBU Take 2 mLs (15 mcg total) by nebulization 2 (two) times daily. 120 mL 0   aspirin 81 MG tablet Take 81 mg by mouth at bedtime.     budesonide (PULMICORT) 0.5 MG/2ML nebulizer solution  Take 2 mLs (0.5 mg total) by nebulization 2 (two) times daily. 36 mL 6   Coenzyme Q10 (CO Q 10 PO) Take 1 capsule by mouth daily.     furosemide (LASIX) 20 MG tablet Take 1 tablet po BID for 5 days then go back to taking 1 tablet po QD (Patient taking differently: Take 20 mg by mouth daily.) 90 tablet 1   ipratropium-albuterol (DUONEB) 0.5-2.5 (3) MG/3ML SOLN Take 3 mLs by nebulization every 4 (four) hours as needed. 360 mL 1   KLOR-CON M10 10 MEQ tablet TAKE 1 TABLET BY MOUTH EVERY DAY (Patient taking differently: Take 10 mEq by mouth every other day.) 90 tablet 1   levothyroxine (SYNTHROID) 25 MCG tablet Take 0.5 tablets (12.5 mcg total) by mouth daily. In addition to the 50 mcg. 45 tablet 1   levothyroxine (SYNTHROID) 50 MCG tablet Take 1 tablet (50 mcg total) by mouth See admin instructions. Along with 12.5 mcg qd except on Sunday (pt just takes 50 mcg) 90 tablet 1   lidocaine-prilocaine (EMLA) cream Apply 1 Application topically as needed. (Patient taking differently: Apply 1 Application topically as needed (pain).) 30 g 2   metoprolol succinate (TOPROL-XL) 100 MG 24 hr tablet Take 1 tablet (100 mg total) by mouth daily. Take with or immediately following a meal. 90 tablet 1   naproxen sodium (ALEVE) 220 MG tablet Take 220 mg by mouth 2 (two) times daily as needed (pain).     Omega-3 Fatty Acids (OMEGA-3 PO) Take 1,000 mg by mouth in the morning and at bedtime.     predniSONE (DELTASONE) 10 MG tablet Take 5 tablets (50 mg total) by mouth daily for 7 days, THEN 4 tablets (40 mg total) daily for 7 days, THEN 3 tablets (30 mg total) daily for 7 days, THEN 2 tablets (20 mg total) daily. 204 tablet 0   revefenacin (YUPELRI) 175 MCG/3ML nebulizer solution Take 3 mLs (175 mcg total) by nebulization daily. 90 mL 1   rosuvastatin (CRESTOR) 40 MG tablet Take 1 tablet (40 mg total) by mouth daily. 90 tablet 3   sulfamethoxazole-trimethoprim (BACTRIM DS) 800-160 MG tablet Take 1 tablet by mouth every Monday,  Wednesday, and Friday for 21 days. 9 tablet 0   No current facility-administered medications for this visit.    SURGICAL HISTORY:  Past Surgical History:  Procedure Laterality Date   CARDIAC CATHETERIZATION  07/06/2010   see CABG report - pt sent to OR   CARDIOVASCULAR STRESS TEST  09/11/2010   R/S MV - normal perfusion in all regions, EF 46%, no scintigraphic evidence of inducible myocardial ischemia; global LV systolic function mildly reduced; no significant wall motion abnormalities noted; Exercise capacity 7 METS; EKG negative for ischemia; low risk study, no signifcant change from previous study 08/2003   CORONARY ARTERY BYPASS GRAFT  07/11/2010   LIMA to LAD; SVG to 2nd branch OM; SVG to posterior descending artery   IR IMAGING GUIDED PORT INSERTION  08/16/2021   LEFT VATS  09/17/2010   TEE WITHOUT CARDIOVERSION  07/06/2010   during emergent CABG  surgery; 2-3+ mitral regurgitation   VIDEO BRONCHOSCOPY N/A 07/27/2021   Procedure: VIDEO BRONCHOSCOPY;  Surgeon: Melrose Nakayama, MD;  Location: [facility] OR;  Service: Thoracic;  Laterality: N/A;   VIDEO BRONCHOSCOPY WITH ENDOBRONCHIAL ULTRASOUND N/A 07/27/2021   Procedure: VIDEO BRONCHOSCOPY WITH ENDOBRONCHIAL ULTRASOUND;  Surgeon: Melrose Nakayama, MD;  Location: [facility] OR;  Service: Thoracic;  Laterality: N/A;    REVIEW OF SYSTEMS:  Constitutional: positive for fatigue Eyes: negative Ears, nose, mouth, throat, and face: negative Respiratory: positive for cough and dyspnea on exertion Cardiovascular: negative Gastrointestinal: negative Genitourinary:negative Integument/breast: negative Hematologic/lymphatic: negative Musculoskeletal:positive for muscle weakness Neurological: negative Behavioral/Psych: negative Endocrine: negative Allergic/Immunologic: negative   PHYSICAL EXAMINATION: General appearance: alert, cooperative, fatigued, and no distress Head: Normocephalic, without obvious abnormality, atraumatic Neck: no adenopathy, no  JVD, supple, symmetrical, trachea midline, and thyroid not enlarged, symmetric, no tenderness/mass/nodules Lymph nodes: Cervical, supraclavicular, and axillary nodes normal. Resp: clear to auscultation bilaterally Back: symmetric, no curvature. ROM normal. No CVA tenderness. Cardio: regular rate and rhythm, S1, S2 normal, no murmur, click, rub or gallop GI: soft, non-tender; bowel sounds normal; no masses,  no organomegaly Extremities: extremities normal, atraumatic, no cyanosis or edema Neurologic: Alert and oriented X 3, normal strength and tone. Normal symmetric reflexes. Normal coordination and gait  ECOG PERFORMANCE STATUS: 1 - Symptomatic but completely ambulatory  Blood pressure (!) 103/54, pulse 72, temperature 97.8 F (36.6 C), temperature source Tympanic, resp. rate 18, weight 210 lb 3 oz (95.3 kg), SpO2 99 %.  LABORATORY DATA: Lab Results  Component Value Date   WBC 18.4 (H) 02/14/2022   HGB 11.3 (L) 02/14/2022   HCT 36.3 02/14/2022   MCV 89.9 02/14/2022   PLT 179 02/14/2022      Chemistry      Component Value Date/Time   NA 138 02/08/2022 0317   NA 143 05/02/2021 1055   K 3.5 02/08/2022 0317   CL 97 (L) 02/08/2022 0317   CO2 34 (H) 02/08/2022 0317   BUN 27 (H) 02/08/2022 0317   BUN 13 05/02/2021 1055   CREATININE 0.92 02/08/2022 0317   CREATININE 0.88 01/14/2022 0925   CREATININE 0.81 07/02/2016 1323      Component Value Date/Time   CALCIUM 8.1 (L) 02/08/2022 0317   ALKPHOS 76 02/07/2022 0416   AST 116 (H) 02/07/2022 0416   AST 33 01/14/2022 0925   ALT 322 (H) 02/07/2022 0416   ALT 39 01/14/2022 0925   BILITOT 0.4 02/07/2022 0416   BILITOT 0.3 01/14/2022 0925       RADIOGRAPHIC STUDIES: DG CHEST PORT 1 VIEW  Result Date: 02/08/2022 CLINICAL DATA:  Left upper lobectomy EXAM: PORTABLE CHEST 1 VIEW COMPARISON:  02/02/2022 FINDINGS: Left upper lobectomy with left lung volume loss. Left basilar airspace disease. Mild interstitial thickening of the right  lung. Small left pleural effusion. No pneumothorax. Stable cardiomediastinal silhouette. Prior CABG. Right-sided Port-A-Cath in satisfactory position with the tip projecting over the SVC. No acute osseous abnormality. IMPRESSION: 1. Left upper lobectomy with left lung volume loss. Left basilar airspace disease which may reflect atelectasis versus pneumonia. Electronically Signed   By: Kathreen Devoid M.D.   On: 02/08/2022 15:05   US Abdomen Limited RUQ (LIVER/GB)  Result Date: 02/04/2022 CLINICAL DATA:  Elevated LFTs EXAM: ULTRASOUND ABDOMEN LIMITED RIGHT UPPER QUADRANT COMPARISON:  None Available. FINDINGS: Gallbladder: Nondilated gallbladder. Cholelithiasis with significant shadowing. No wall thickening. Negative sonographic Murphy sign. Largest gallstone measures up to 1.2 cm. Common bile duct: Diameter: 6.1 mm, normal.  No intrahepatic ductal dilation. Liver: Normal echogenicity. There is a left hepatic cyst measuring up to 1.2 cm and a right hepatic cyst measuring up to 1.0 cm, as seen on recent CT. Portal vein is patent on color Doppler imaging with normal direction of blood flow towards the liver. Other: None. IMPRESSION: Cholelithiasis. No evidence of acute cholecystitis or biliary obstruction. Unremarkable sonographic appearance of the liver. Electronically Signed   By: Maurine Simmering M.D.   On: 02/04/2022 20:57   DG CHEST PORT 1 VIEW  Result Date: 02/02/2022 CLINICAL DATA:  Respiratory failure EXAM: PORTABLE CHEST 1 VIEW COMPARISON:  01/28/2022 chest x-ray and CT, chest x-ray 08/07/2021 FINDINGS: Right-sided central venous port tip over the SVC. Post sternotomy changes. Postsurgical changes on the left. Pleural effusion and airspace disease are grossly unchanged. Improved aeration of right thorax compared to prior. Enlarged cardiomediastinal silhouette IMPRESSION: Improved aeration of the right thorax compared to prior. Pleural effusion and airspace disease on the left are grossly unchanged.  Electronically Signed   By: Donavan Foil M.D.   On: 02/02/2022 17:07   CT Angio Chest PE W/Cm &/Or Wo Cm  Result Date: 01/28/2022 CLINICAL DATA:  Pulmonary embolism (PE) suspected, high prob. History of non-small cell lung cancer EXAM: CT ANGIOGRAPHY CHEST WITH CONTRAST TECHNIQUE: Multidetector CT imaging of the chest was performed using the standard protocol during bolus administration of intravenous contrast. Multiplanar CT image reconstructions and MIPs were obtained to evaluate the vascular anatomy. RADIATION DOSE REDUCTION: This exam was performed according to the departmental dose-optimization program which includes automated exposure control, adjustment of the mA and/or kV according to patient size and/or use of iterative reconstruction technique. CONTRAST:  41mL OMNIPAQUE IOHEXOL 350 MG/ML SOLN COMPARISON:  01/07/2022 FINDINGS: Cardiovascular: Satisfactory opacification of the pulmonary arteries. No evidence of pulmonary embolism to the lobar branch level. Evaluation of the more distal pulmonary arterial branches is limited in the setting of respiratory motion artifact. Thoracic aorta is nonaneurysmal. Scattered atherosclerotic vascular calcifications of the aorta and coronary arteries. Prior sternotomy and CABG normal heart size. No pericardial effusion. Right chest port terminates at the level of the proximal right atrium. Mediastinum/Nodes: Prominent soft tissue in the left perihilar region is unchanged. Multiple mildly prominent mediastinal and right hilar lymph nodes have slightly increased the previous study, and may be reactive. No axillary lymphadenopathy. Thyroid, trachea, and esophagus demonstrate no significant findings. Lungs/Pleura: Prior left upper lobectomy with associated volume loss in the left hemithorax. Persistent irregular appearing small left pleural effusion. Progressive interstitial and alveolar opacities throughout the left lower lobe. There are new predominantly ground-glass  opacities with interlobular septal thickening throughout the right lung. No right-sided pleural effusion. No pneumothorax. Upper Abdomen: No acute findings within the included upper abdomen. Musculoskeletal: No chest wall abnormality. No acute or significant osseous findings. Review of the MIP images confirms the above findings. IMPRESSION: 1. No evidence of pulmonary embolism to the lobar branch level. 2. New predominantly ground-glass opacities with interlobular septal thickening throughout the right lung, which may represent pulmonary edema or atypical infection. 3. Post-treatment changes from prior left upper lobectomy. Persistent irregular appearing small left pleural effusion. Progressive interstitial and alveolar opacities throughout the left lower lobe, which may represent edema, infection, versus lymphangitic spread of tumor. 4. Multiple mildly prominent mediastinal and right hilar lymph nodes have slightly increased the previous study, and may be reactive or metastatic. Attention on follow-up. 5. Aortic and coronary artery atherosclerosis (ICD10-I70.0). Electronically Signed   By: Davina Poke D.O.  On: 01/28/2022 19:43   DG Chest Port 1 View  Result Date: 01/28/2022 CLINICAL DATA:  Pt arrived via POV, c/o low oxygen. States had follow up in office, told oxygen was mid 39s. Pt states she had her oxygen increased to 5L [place] at home recently. EXAM: PORTABLE CHEST - 1 VIEW COMPARISON:  08/29/2021 FINDINGS: Interval decrease in left pleural effusion. Improved aeration of the left lung base although moderate scattered airspace opacities persist. There has been some increase in ill-defined alveolar opacities at the right lung base. Stable right IJ power port to the distal SVC. CABG markers. Heart size and mediastinal contours are within normal limits. Aortic Atherosclerosis (ICD10-170.0). No pneumothorax. Sternotomy wires. IMPRESSION: 1. Interval decrease in left pleural effusion. 2. Worsening right  lower lung airspace disease. Electronically Signed   By: Lucrezia Europe M.D.   On: 01/28/2022 16:19   ECHOCARDIOGRAM COMPLETE  Result Date: 01/21/2022    ECHOCARDIOGRAM REPORT   Patient Name:   ELLISA DEVIVO Date of Exam: 01/21/2022 Medical Rec #:  [id number]         Height:       64.0 in Accession #:    [id number]        Weight:       216.0 lb Date of Birth:  Aug 14, 1945         BSA:          2.021 m Patient Age:    20 years          BP:           116/68 mmHg Patient Gender: F                 HR:           90 bpm. Exam Location:  [facility] Procedure: 2D Echo, Cardiac Doppler and Color Doppler Indications:    I25.5 Ischemic cardiomyopathy  History:        Patient has prior history of Echocardiogram examinations, most                 recent 11/14/2020. CHF, CAD and Previous Myocardial Infarction,                 Prior CABG, Arrythmias:Atrial Fibrillation; Risk                 Factors:Hypertension, Dyslipidemia and Former Smoker. Lower                 extremity edema. Lung cancer. Radiation therapy. Anemia.                 Obesity.  Sonographer:    Diamond Nickel RCS Referring Phys: [place]  1. Difficult study due to poor visualization of LV endocardium.  2. Left ventricular ejection fraction, by estimation, is 55 to 60%. The left ventricle has normal function. Left ventricular endocardial border not optimally defined to evaluate regional wall motion. Left ventricular diastolic parameters are consistent with Grade I diastolic dysfunction (impaired relaxation). The average left ventricular global longitudinal strain is -18.1 %. The global longitudinal strain is normal.  3. Right ventricular systolic function is mildly reduced. The right ventricular size is normal. Tricuspid regurgitation signal is inadequate for assessing PA pressure.  4. The mitral valve is grossly normal. Trivial mitral valve regurgitation.  5. The aortic valve was not well visualized. Aortic valve regurgitation is not  visualized. No aortic stenosis is present.  6. The inferior vena cava is normal in size with  greater than 50% respiratory variability, suggesting right atrial pressure of 3 mmHg. Comparison(s): Compared to prior TTE in 10/2020, the EF appears slightly improved from 50-55% to 55%. Otherwise, there is no significant change. FINDINGS  Left Ventricle: Left ventricular ejection fraction, by estimation, is 55 to 60%. The left ventricle has normal function. Left ventricular endocardial border not optimally defined to evaluate regional wall motion. The average left ventricular global longitudinal strain is -18.1 %. The global longitudinal strain is normal. The global longitudinal strain is normal despite suboptimal segment tracking. The left ventricular internal cavity size was normal in size. There is no left ventricular hypertrophy. Left ventricular diastolic parameters are consistent with Grade I diastolic dysfunction (impaired relaxation). Right Ventricle: The right ventricular size is normal. Right vetricular wall thickness was not well visualized. Right ventricular systolic function is mildly reduced. Tricuspid regurgitation signal is inadequate for assessing PA pressure. Left Atrium: Left atrial size was normal in size. Right Atrium: Right atrial size was normal in size. Pericardium: There is no evidence of pericardial effusion. Mitral Valve: The mitral valve is grossly normal. There is mild thickening of the mitral valve leaflet(s). There is mild calcification of the mitral valve leaflet(s). Trivial mitral valve regurgitation. Tricuspid Valve: The tricuspid valve is normal in structure. Tricuspid valve regurgitation is trivial. Aortic Valve: The aortic valve was not well visualized. Aortic valve regurgitation is not visualized. No aortic stenosis is present. Pulmonic Valve: The pulmonic valve was not well visualized. Pulmonic valve regurgitation is not visualized. Aorta: The aortic root and ascending aorta are  structurally normal, with no evidence of dilitation. Venous: The inferior vena cava is normal in size with greater than 50% respiratory variability, suggesting right atrial pressure of 3 mmHg. IAS/Shunts: The atrial septum is grossly normal.  LEFT VENTRICLE PLAX 2D LVIDd:         5.10 cm   Diastology LVIDs:         2.90 cm   LV e' medial:    7.62 cm/s LV PW:         0.90 cm   LV E/e' medial:  11.4 LV IVS:        0.90 cm   LV e' lateral:   8.49 cm/s LVOT diam:     1.90 cm   LV E/e' lateral: 10.2 LV SV:         58 LV SV Index:   29        2D Longitudinal Strain LVOT Area:     2.84 cm  2D Strain GLS Avg:     -18.1 %  RIGHT VENTRICLE RV S prime:     6.96 cm/s TAPSE (M-mode): 1.1 cm LEFT ATRIUM             Index        RIGHT ATRIUM           Index LA diam:        3.30 cm 1.63 cm/m   RA Area:     15.00 cm LA Vol (A2C):   41.4 ml 20.48 ml/m  RA Volume:   35.10 ml  17.36 ml/m LA Vol (A4C):   25.7 ml 12.71 ml/m LA Biplane Vol: 33.3 ml 16.47 ml/m  AORTIC VALVE LVOT Vmax:   98.60 cm/s LVOT Vmean:  59.900 cm/s LVOT VTI:    0.204 m  AORTA Ao Root diam: 2.60 cm Ao Asc diam:  3.10 cm MITRAL VALVE MV Area (PHT): 3.27 cm     SHUNTS MV Decel Time:  232 msec     Systemic VTI:  0.20 m MV E velocity: 87.00 cm/s   Systemic Diam: 1.90 cm MV A velocity: 111.00 cm/s MV E/A ratio:  0.78 Gwyndolyn Kaufman MD Electronically signed by Gwyndolyn Kaufman MD Signature Date/Time: 01/21/2022/4:45:45 PM    Final     ASSESSMENT AND PLAN: This is a very pleasant 76 years old white female with Stage IIb/IIIa (T3, N0/N2, M0) non-small cell lung cancer, squamous cell carcinoma presented with left hilar mass with suspicious mediastinal lymphadenopathy on the last CT scan of the chest at [facility]. She also has a history of stage Ia non-small cell lung cancer, adenocarcinoma status post surgical resection in July 2012 as well as recurrent stage Ia non-small cell lung cancer, squamous cell carcinoma status post SBRT completed January 30, 2021. The patient completed a course of concurrent chemoradiation with weekly carboplatin for AUC of 2 and paclitaxel 45 Mg/M2 status post 6 cycles.   She is currently undergoing consolidation treatment with immunotherapy with Imfinzi 1500 Mg IV every 4 weeks.  Status post 4 cycle started on 8/31 2023. The patient has been tolerating this treatment well except for the recent admission to the hospital with worsening dyspnea that was suspicious to be secondary to either pulmonary edema versus infection versus drug-induced pneumonitis from amiodarone or Imfinzi. She is currently on a tapered dose of prednisone and she is feeling much better. Her treatment with amiodarone was discontinued. I recommended for the patient to continue with the taper dose of prednisone over the next few weeks.  I will hold her treatment with Imfinzi for now until I see the patient back for follow-up visit in 1 months with repeat CT scan of the chest for reevaluation of her disease before resuming her treatment with immunotherapy at that time. The patient was advised to call immediately if she has any other concerning symptoms in the interval.  The patient voices understanding of current disease status and treatment options and is in agreement with the current care plan.  All questions were answered. The patient knows to call the clinic with any problems, questions or concerns. We can certainly see the patient much sooner if necessary.  The total time spent in the appointment was 30 minutes.  Disclaimer: This note was dictated with voice recognition software. Similar sounding words can inadvertently be transcribed and may not be corrected upon review.

## 2022-02-14 NOTE — Progress Notes (Signed)
This patient's port was flushed, blood return noted and heparin locked, then de accessed.  The patient tolerated well no discomfort noted.  Left clinic in wheelchair with spouse.  No further concerns at this time.

## 2022-02-19 ENCOUNTER — Ambulatory Visit (HOSPITAL_COMMUNITY)
Admit: 2022-02-19 | Discharge: 2022-02-19 | Disposition: A | Discharge status: Home / Self Care | Payer: Medicare HMO | Attending: Physician Assistant | Admitting: Physician Assistant

## 2022-02-19 ENCOUNTER — Encounter (HOSPITAL_COMMUNITY): Payer: Self-pay | Admitting: Physician Assistant

## 2022-02-19 VITALS — BP 102/60 | HR 82 | Ht 63.0 in | Wt 211.0 lb

## 2022-02-19 DIAGNOSIS — C3490 Malignant neoplasm of unspecified part of unspecified bronchus or lung: Secondary | ICD-10-CM | POA: Insufficient documentation

## 2022-02-19 DIAGNOSIS — Z951 Presence of aortocoronary bypass graft: Secondary | ICD-10-CM | POA: Insufficient documentation

## 2022-02-19 DIAGNOSIS — D6869 Other thrombophilia: Secondary | ICD-10-CM | POA: Insufficient documentation

## 2022-02-19 DIAGNOSIS — I4892 Unspecified atrial flutter: Secondary | ICD-10-CM | POA: Diagnosis not present

## 2022-02-19 DIAGNOSIS — I480 Paroxysmal atrial fibrillation: Secondary | ICD-10-CM | POA: Insufficient documentation

## 2022-02-19 DIAGNOSIS — Z7901 Long term (current) use of anticoagulants: Secondary | ICD-10-CM | POA: Insufficient documentation

## 2022-02-19 DIAGNOSIS — I2510 Atherosclerotic heart disease of native coronary artery without angina pectoris: Secondary | ICD-10-CM | POA: Insufficient documentation

## 2022-02-19 NOTE — Progress Notes (Signed)
Primary Care Physician: [name], NP Primary Cardiologist: Dr Claiborne Billings Primary Electrophysiologist: none Referring Physician: Dr Loistine Simas is a 77 y.o. female with a history of CAD status post CABG in 2012, ischemic cardiomyopathy (EF 35% in 2012, improved to 31 to 55% 10/2020) NSCLC, COPD, tobacco use, chronic respiratory failure on 3L, cerebral amyloid angiopathy, atrial fibrillation who presents for consultation in the [facility]. During admission 07/2021 she was found to have paroxysmal atrial flutter, started on amiodarone at that time. Anticoagulation was deferred given concern for amyloid angiopathy on brain MRI with evidence of chronic peripheral microhemorrhage. She was admitted 01/28/2022 with worsening hypoxia, noted to have SpO2 down to 70s on 5 L at PCP office. CTPA showed no PE but showed new groundglass opacities and interstitial opacities. She was started on antibiotics, steroids, and Lasix. PCCM was consulted, thought to represent either immunotherapy or amiodarone related pneumonitis. She was started on high-dose Solu-Medrol and Rocephin/azithromycin. Amiodarone was discontinued. Patient has a CHADS2VASC score of 4. Patient referred to the AF clinic to discuss rhythm control options. She remains in SR today.   Today, she denies symptoms of palpitations, chest pain, shortness of breath, orthopnea, PND, lower extremity edema, dizziness, presyncope, syncope, snoring, daytime somnolence, bleeding, or neurologic sequela. The patient is tolerating medications without difficulties and is otherwise without complaint today.    Atrial Fibrillation Risk Factors:  she does not have symptoms or diagnosis of sleep apnea. she does not have a history of rheumatic fever.   she has a BMI of Body mass index is 37.38 kg/m.Marland Kitchen Filed Weights   02/19/22 1440  Weight: 95.7 kg    Family History  Problem Relation Age of Onset   Hypothyroidism  Father    Heart attack Father    Aplastic anemia Maternal Grandfather    Stroke Paternal Grandfather      Atrial Fibrillation Management history:  Previous antiarrhythmic drugs: amiodarone Previous cardioversions: none Previous ablations: none CHADS2VASC score: 4 Anticoagulation history: none   Past Medical History:  Diagnosis Date   CAD (coronary artery disease)    Cancer ([place])    COPD (chronic obstructive pulmonary disease) ([place])    per DM note, pt denies   Dyslipidemia    GERD (gastroesophageal reflux disease)    History of radiation therapy    Left lung- 01/16/21-01/30/21- Dr. Gery Pray   History of radiation therapy    Left lung- 08/02/21-09/18/21- Dr. Gery Pray   HTN (hypertension)    Hypothyroidism    Myocardial infarct [facility])    Non-small cell lung cancer ([place]) 08/19/2010   Stage IA, status post left upper lobectomy July 2012   Tobacco abuse    Past Surgical History:  Procedure Laterality Date   CARDIAC CATHETERIZATION  07/06/2010   see CABG report - pt sent to OR   CARDIOVASCULAR STRESS TEST  09/11/2010   R/S MV - normal perfusion in all regions, EF 46%, no scintigraphic evidence of inducible myocardial ischemia; global LV systolic function mildly reduced; no significant wall motion abnormalities noted; Exercise capacity 7 METS; EKG negative for ischemia; low risk study, no signifcant change from previous study 08/2003   CORONARY ARTERY BYPASS GRAFT  07/11/2010   LIMA to LAD; SVG to 2nd branch OM; SVG to posterior descending artery   IR IMAGING GUIDED PORT INSERTION  08/16/2021   LEFT VATS  09/17/2010   TEE WITHOUT CARDIOVERSION  07/06/2010   during emergent CABG surgery; 2-3+ mitral  regurgitation   VIDEO BRONCHOSCOPY N/A 07/27/2021   Procedure: VIDEO BRONCHOSCOPY;  Surgeon: Melrose Nakayama, MD;  Location: [facility];  Service: Thoracic;  Laterality: N/A;   VIDEO BRONCHOSCOPY WITH ENDOBRONCHIAL ULTRASOUND N/A 07/27/2021   Procedure: VIDEO BRONCHOSCOPY WITH  ENDOBRONCHIAL ULTRASOUND;  Surgeon: Melrose Nakayama, MD;  Location: [facility] OR;  Service: Thoracic;  Laterality: N/A;    Current Outpatient Medications  Medication Sig Dispense Refill   acetaminophen (TYLENOL) 500 MG tablet Take 500 mg by mouth every 6 (six) hours as needed for moderate pain or fever.     albuterol (PROAIR HFA) 108 (90 Base) MCG/ACT inhaler 2 puffs up to every 4 hours as needed only  if your can't catch your breath (Patient taking differently: Inhale 2 puffs into the lungs every 4 (four) hours as needed for wheezing or shortness of breath.)     albuterol (PROVENTIL) (2.5 MG/3ML) 0.083% nebulizer solution Take 2.5 mg by nebulization 3 (three) times daily as needed for wheezing or shortness of breath.     arformoterol (BROVANA) 15 MCG/2ML NEBU Take 2 mLs (15 mcg total) by nebulization 2 (two) times daily. 120 mL 0   aspirin 81 MG tablet Take 81 mg by mouth at bedtime.     BEVESPI AEROSPHERE 9-4.8 MCG/ACT AERO Inhale 2 puffs into the lungs 2 (two) times daily.     budesonide (PULMICORT) 0.5 MG/2ML nebulizer solution Take 2 mLs (0.5 mg total) by nebulization 2 (two) times daily. 36 mL 6   Coenzyme Q10 (CO Q 10 PO) Take 1 capsule by mouth daily.     furosemide (LASIX) 20 MG tablet Take 1 tablet po BID for 5 days then go back to taking 1 tablet po QD (Patient taking differently: Take 20 mg by mouth daily.) 90 tablet 1   ipratropium-albuterol (DUONEB) 0.5-2.5 (3) MG/3ML SOLN Take 3 mLs by nebulization every 4 (four) hours as needed. 360 mL 1   KLOR-CON M10 10 MEQ tablet TAKE 1 TABLET BY MOUTH EVERY DAY (Patient taking differently: Take 10 mEq by mouth every other day.) 90 tablet 1   levothyroxine (SYNTHROID) 25 MCG tablet Take 0.5 tablets (12.5 mcg total) by mouth daily. In addition to the 50 mcg. 45 tablet 1   levothyroxine (SYNTHROID) 50 MCG tablet Take 1 tablet (50 mcg total) by mouth See admin instructions. Along with 12.5 mcg qd except on Sunday (pt just takes 50 mcg) 90 tablet 1    lidocaine-prilocaine (EMLA) cream Apply 1 Application topically as needed. (Patient taking differently: Apply 1 Application topically as needed (pain).) 30 g 2   metoprolol succinate (TOPROL-XL) 100 MG 24 hr tablet Take 1 tablet (100 mg total) by mouth daily. Take with or immediately following a meal. 90 tablet 1   naproxen sodium (ALEVE) 220 MG tablet Take 220 mg by mouth 2 (two) times daily as needed (pain).     Omega-3 Fatty Acids (OMEGA-3 PO) Take 1,000 mg by mouth in the morning and at bedtime.     predniSONE (DELTASONE) 10 MG tablet Take 5 tablets (50 mg total) by mouth daily for 7 days, THEN 4 tablets (40 mg total) daily for 7 days, THEN 3 tablets (30 mg total) daily for 7 days, THEN 2 tablets (20 mg total) daily. 204 tablet 0   revefenacin (YUPELRI) 175 MCG/3ML nebulizer solution Take 3 mLs (175 mcg total) by nebulization daily. 90 mL 1   rosuvastatin (CRESTOR) 40 MG tablet Take 1 tablet (40 mg total) by mouth daily. 90 tablet 3  sulfamethoxazole-trimethoprim (BACTRIM DS) 800-160 MG tablet Take 1 tablet by mouth every Monday, Wednesday, and Friday for 21 days. 9 tablet 0   No current facility-administered medications for this encounter.    Allergies  Allergen Reactions   Codeine Other (See Comments)    Unknown reaction Patient avoids this medication    Social History   Socioeconomic History   Marital status: Married    Spouse name: Not on file   Number of children: Not on file   Years of education: Not on file   Highest education level: Not on file  Occupational History   Not on file  Tobacco Use   Smoking status: Former    Packs/day: 1.00    Years: 58.00    Total pack years: 58.00    Types: Cigarettes    Quit date: 07/19/2021    Years since quitting: 0.5   Smokeless tobacco: Former   Tobacco comments:    Former smoker 02/19/22  Vaping Use   Vaping Use: Never used  Substance and Sexual Activity   Alcohol use: No    Alcohol/week: 0.0 standard drinks of alcohol    Drug use: No   Sexual activity: Never  Other Topics Concern   Not on file  Social History Narrative   Not on file   Social Determinants of Health   Financial Resource Strain: Low Risk  (02/12/2022)   Overall Financial Resource Strain (CARDIA)    Difficulty of Paying Living Expenses: Not very hard  Food Insecurity: No Food Insecurity (01/28/2022)   Hunger Vital Sign    Worried About Running Out of Food in the Last Year: Never true    [place] in the Last Year: Never true  Transportation Needs: No Transportation Needs (02/12/2022)   PRAPARE - Hydrologist (Medical): No    Lack of Transportation (Non-Medical): No  Physical Activity: Inactive (01/28/2022)   Exercise Vital Sign    Days of Exercise per Week: 0 days    Minutes of Exercise per Session: 0 min  Stress: No Stress Concern Present (01/28/2022)   [place]    Feeling of Stress : Not at all  Social Connections: Socially Isolated (01/28/2022)   Social Connection and Isolation Panel [NHANES]    Frequency of Communication with Friends and Family: Once a week    Frequency of Social Gatherings with Friends and Family: Once a week    Attends Religious Services: Never    Marine scientist or Organizations: No    Attends Archivist Meetings: Never    Marital Status: Married  Human resources officer Violence: Not At Risk (01/28/2022)   Humiliation, Afraid, Rape, and Kick questionnaire    Fear of Current or Ex-Partner: No    Emotionally Abused: No    Physically Abused: No    Sexually Abused: No     ROS- All systems are reviewed and negative except as per the HPI above.  Physical Exam: Vitals:   02/19/22 1440  BP: 102/60  Pulse: 82  Weight: 95.7 kg  Height: 5\' 3"  (1.6 m)    GEN- The patient is a well appearing elderly female, alert and oriented x 3 today.   Head- normocephalic, atraumatic Eyes-  Sclera clear,  conjunctiva pink Ears- hearing intact Oropharynx- clear Neck- supple  Lungs- slight wheezing on L lung, normal work of breathing, on O2 nasal canula Heart- Regular rate and rhythm, no murmurs, rubs or gallops  GI- soft, NT, ND, + BS Extremities- no clubbing, cyanosis, or edema MS- no significant deformity or atrophy Skin- no rash or lesion Psych- euthymic mood, full affect Neuro- strength and sensation are intact  Wt Readings from Last 3 Encounters:  02/19/22 95.7 kg  02/14/22 95.3 kg  02/07/22 93.5 kg    EKG today demonstrates  SR, NST Vent. rate 82 BPM PR interval 126 ms QRS duration 80 ms QT/QTcB 380/443 ms  Echo 01/21/22 demonstrated   1. Difficult study due to poor visualization of LV endocardium.   2. Left ventricular ejection fraction, by estimation, is 55 to 60%. The  left ventricle has normal function. Left ventricular endocardial border  not optimally defined to evaluate regional wall motion. Left ventricular  diastolic parameters are consistent with Grade I diastolic dysfunction (impaired relaxation). The average left ventricular global longitudinal strain is -18.1 %. The global longitudinal strain is normal.   3. Right ventricular systolic function is mildly reduced. The right  ventricular size is normal. Tricuspid regurgitation signal is inadequate  for assessing PA pressure.   4. The mitral valve is grossly normal. Trivial mitral valve  regurgitation.   5. The aortic valve was not well visualized. Aortic valve regurgitation  is not visualized. No aortic stenosis is present.   6. The inferior vena cava is normal in size with greater than 50%  respiratory variability, suggesting right atrial pressure of 3 mmHg.   Comparison(s): Compared to prior TTE in 10/2020, the EF appears slightly improved from 50-55% to 55%. Otherwise, there is no significant change.   Epic records are reviewed at length today  CHA2DS2-VASc Score = 4  The patient's score is based  upon: CHF History: 0 (EF normalized) HTN History: 0 Diabetes History: 0 Stroke History: 0 Vascular Disease History: 1 Age Score: 2 Gender Score: 1       ASSESSMENT AND PLAN: 1. Paroxysmal Atrial Fibrillation/atrial flutter The patient's CHA2DS2-VASc score is 4, indicating a 4.8% annual risk of stroke.   Patient in [place] today.  Amiodarone discontinued due to concern about possible lung toxicity. Multaq should also not be used if amio lung toxicity suspected. Would avoid class IC with h/o CAD. Not an ablation candidate with other comorbidities. Dofetilide is likely the only rhythm control option.  Patient agreeable to dofetilide admission after amio washout. Will have her return in about 6 weeks to check an amiodarone level. She is on Bactrim, scheduled to d/c on 03/01/22. Not felt to be a candidate for anticoagulation with h/o cerebral amyloid angiopathy on MRI with evidence of microhemorrhage.  Continue Toprol 100 mg daily  2. Secondary Hypercoagulable State (ICD10:  D68.69) The patient is at significant risk for stroke/thromboembolism based upon her CHA2DS2-VASc Score of 4.  However, the patient is not on anticoagulation due to her high bleeding risk. Unlikely Watchman candidate with other comorbidities.   3. CAD S/p CABG 2012 No anginal symptoms.  4. Non small cell lung cancer Plans per pulmonology and oncology   Follow up in the AF clinic in 6 weeks.    [facility] [address] [place], [place] [address] [phone] 02/19/2022 4:14 PM

## 2022-02-20 ENCOUNTER — Encounter: Payer: Self-pay | Admitting: Nurse Practitioner

## 2022-02-20 ENCOUNTER — Ambulatory Visit (INDEPENDENT_AMBULATORY_CARE_PROVIDER_SITE_OTHER): Payer: Medicare HMO | Admitting: Nurse Practitioner

## 2022-02-20 ENCOUNTER — Ambulatory Visit (INDEPENDENT_AMBULATORY_CARE_PROVIDER_SITE_OTHER): Payer: Medicare HMO

## 2022-02-20 VITALS — BP 128/78 | HR 77 | Ht 63.0 in | Wt 211.0 lb

## 2022-02-20 DIAGNOSIS — C3492 Malignant neoplasm of unspecified part of left bronchus or lung: Secondary | ICD-10-CM

## 2022-02-20 DIAGNOSIS — J9612 Chronic respiratory failure with hypercapnia: Secondary | ICD-10-CM

## 2022-02-20 DIAGNOSIS — J9611 Chronic respiratory failure with hypoxia: Secondary | ICD-10-CM

## 2022-02-20 DIAGNOSIS — J9621 Acute and chronic respiratory failure with hypoxia: Secondary | ICD-10-CM

## 2022-02-20 DIAGNOSIS — J449 Chronic obstructive pulmonary disease, unspecified: Secondary | ICD-10-CM | POA: Diagnosis not present

## 2022-02-20 DIAGNOSIS — J984 Other disorders of lung: Secondary | ICD-10-CM

## 2022-02-20 DIAGNOSIS — J90 Pleural effusion, not elsewhere classified: Secondary | ICD-10-CM | POA: Diagnosis not present

## 2022-02-20 DIAGNOSIS — J9622 Acute and chronic respiratory failure with hypercapnia: Secondary | ICD-10-CM | POA: Diagnosis not present

## 2022-02-20 DIAGNOSIS — C3490 Malignant neoplasm of unspecified part of unspecified bronchus or lung: Secondary | ICD-10-CM | POA: Diagnosis not present

## 2022-02-20 DIAGNOSIS — T50905A Adverse effect of unspecified drugs, medicaments and biological substances, initial encounter: Secondary | ICD-10-CM | POA: Insufficient documentation

## 2022-02-20 MED ORDER — SULFAMETHOXAZOLE-TRIMETHOPRIM 800-160 MG PO TABS: 1.0000 | ORAL_TABLET | ORAL | 0 refills | Status: AC

## 2022-02-20 NOTE — Assessment & Plan Note (Addendum)
Recent hospitalization with significant O2 requirements. Able to wean to 6 lpm upon discharge. She is currently only using between 3.5-4 lpm. States that most of the time her O2 levels are above 90% but she is still getting short winded with exertion, especially dressing. Walking oximetry today; required 6 lpm to maintain sats >88-90%. Reviewed importance of avoiding hypoxia and adjusting O2 accordingly. She verbalized understanding. She will increase O2 to 6 lpm with activity for goal >88-90%. We will order her an Oxymizer to help conserve O2 with portable tanks.

## 2022-02-20 NOTE — Patient Instructions (Signed)
-  Continue arformoterol 2 mL neb Twice daily  -Continue budesonide 2 mL Twice daily. Brush tongue and rinse mouth afterwards -Continue Yupelri 3 mL once daily Stop Bevespi. -Continue Albuterol inhaler 2 puffs or 3 mL neb every 6 hours as needed for shortness of breath or wheezing. Notify if symptoms persist despite rescue inhaler/neb use. -Continue supplemental oxygen 3-4 lpm with rest and 6 lpm with activity for goal >88-90%. Orders sent for Oxymizer cannula - you can use this with your portable tanks on 3 lpm -Continue prednisone taper - decrease by 10 mg every week until complete -Continue bactrim Monday, Wednesday and Friday until you are taking 20 mg of prednisone daily or less, then you can stop the bactrim   Chest x ray today   Follow up in 4-5 weeks with Dr. Melvyn Novas or Katie Jaythan Hinely,NP. If symptoms do not improve or worsen, please contact office for sooner follow up or seek emergency care.

## 2022-02-20 NOTE — Assessment & Plan Note (Signed)
She was hospitalized in June for complete left lung atelectasis due to obstructing hilar mass. She completed chemo and radiation in August with significant clinical and radiographic improvement. She was transitioned to consolidation immunotherapy with Imfinzi. 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 She is currently under surveillance. Plan for repeat CT in February with Dr. Julien Nordmann.

## 2022-02-20 NOTE — Assessment & Plan Note (Signed)
Severe COPD. Transitioned to triple therapy nebs upon discharge. She is still using Bevespi as well. Instructed to discontinue; medication education provided. She will continue with triple therapy regimen.

## 2022-02-20 NOTE — Progress Notes (Signed)
@Patient  ID: [name], female    DOB: [birth year], 77 y.o.   MRN: [id number]  Chief Complaint  Patient presents with   Follow-up    Pt HFU, she states she is feeling better. Having increased SOB and low o2 sats. She is currently using 3.5-4 L/m continuous. She also has a productive cough w/ creamy color mucous     Referring provider: [name], NP  HPI: 77 year old female, active smoker followed for COPD, left recurrent lung cancer, left lung collapse.  She is a patient Dr. Gustavus Bryant and last seen in office on 10/03/2021 by [facility] NP.  Past medical history significant for CAD, CHF, PAF, hypertension, GERD, hypothyroidism, IDA, HLD.  She has a history of left upper lobe adenocarcinoma post lobectomy 2012.  Found to have an enlarging left lower lobe nodule in 2022 and underwent SBRT with resolution.  In May 2023, she was found to have a hypermetabolic large left hilar mass with associated left-sided volume loss.  She underwent bronchoscopy with Dr. Roxan Hockey on 6/9 and was diagnosed with recurrent non-small cell cancer.  The following day she was admitted to [facility] for leg swelling and shortness of breath.  She was found to have a large left-sided pleural effusion which chest tube was placed for.  Effusion did not resolve despite drainage and diuresis.  She was started on steroids and discharged with plans to see radiation oncology the same day.  Radiation was started 6/15 and was repeated daily with plans to start chemotherapy in near future.  She presented to radiation therapy on 6/20 and was noted to be tachycardic-sent to [facility] long ED and found to be in SVT.  CXR during the stay was also significant for complete opacification of the left hemithorax; no intervention.  She was treated with IV metoprolol and discharged home.  She went back to the ED on 6/21 with palpitations and hot flashes.  She was found to be in SVT again and given 2 doses of adenosine, which was unsuccessful.  She  then received IV beta-blocker with rate control and A-fib was seen; cardiology consulted.  During her hospital stay in June 2023, a repeat CT scan showed full left lung atelectasis, proximal left hilar mass and pleural fluid with suspected partial loculations.  PCCM was consulted to discuss possible intervention.  It was decided that there was no indication for chest tube placement as she did not have any previous improvement with this due to proximal obstruction.  There was also no role for VATS decortication.  Recommended waiting until she completed radiation and started chemotherapy to reevaluate to see if proximal obstruction had resolved/improved and if she would be a candidate for VATS or Pleurx catheter. She was discharged on 6/24 with plans to follow up outpatient.   She went back to the hospital 01/28/2022 with increasing oxygen requirements and was admitted for acute on chronic respiratory failure secondary to drug induced pneumonitis, possibly related to amiodarone and immunotherapy Imfinzi. Question possible pneumonia as well - treated with rocephin and azithromycin. She was treated with high dose steroids and transitioned to prolonged taper upon discharge as well as Bactim for PJP prophylaxis.   TEST/EVENTS:  04/2015 spiro: ratio 59, FEV1 39 09/11/2020 PFTs: FVC 47, FEV1 52, ratio 78, TLC 59, DLCOcor 48 08/07/2021 CT chest with contrast: Atherosclerosis/CAD.  There is no evidence of central or segmental PE.  The known left hilar mass is poorly visualized and measuring up to 4 cm.  There is interval complete  hypodense heterogeneous opacification of the left lung.  There is interval development of at least a small volume of left pleural fluid.  The right lung is clear. 10/06/2021 CT chest with contrast: some regression of large neoplasm in left lung with re-expansion, indicating positive response to therapy. Ew thick-walled cavitary lesion and LUL nodule. Fullness in the left hilar region, likely  residual malignant tissue and/or LAD. Small partially loculated pleural effusion, decreased in size, likely malignant.   08/22/2021: [name] with Ellissa Ayo NP for hospital follow-up.  Since she was discharged, she has started concurrent chemoradiation.  She completed cycle 1 of carboplatin/paclitaxel and did not have any major side effects.  She started cycle 2 this week. She reports that her breathing is overall stable since discharge, possibly with slight improvement in activity tolerance. She does not feel back to her baseline and still gets winded with daily activities such as dressing. She also has begun to cough up some clear sputum again, which is normal for her. She denies any hemoptysis, wheezing, fevers, night sweats, weight loss. She rarely uses her albuterol rescue or nebulizer, but has felt like the nebulizer helps in the past. She was also previously on Incruse but stopped it because she wasn't sure how to use it properly. She did feel like it helped her breathing though. Started on [employer] - instructed on proper use. I did educate her that this may be of little benefit right now given the proximal obstructing mass; however, hopefully if this is resolved/improved, she will notice a difference in her baseline dyspnea. She was unable to maintain saturations on pulsed O2. Instructed she will need to use 4 lpm continuous - sent best fit portable. Re-evaluate use of POC in the future. Check outpatient ABG to determine need for BiPAP/NIV   10/03/2021: OV with Amardeep Beckers NP for follow up. Since I saw her last, she has completed radiation therapy on 8/1 and cycle 6 of chemo, which will be her final. She should have upcoming CT chest next week. Today, she reports that her breathing has improved. She still has dyspnea with minimal exertion but feels as though she can do more activities such as dressing herself and eating without becoming as winded. She is feeling better overall actually. Feels like she tolerated  chemo/radiation without much difficulty. She does have some occasional pain in her left breast that comes and goes, related to radiation. Her cough is at baseline, usually just dry and doesn't bother her much. She occasionally notices some wheezing. She has not been using her Stiolto regularly. She rarely uses her rescue. She continues on supplemental oxygen - using POC at 3 lpm. Not routinely monitoring her oxygen at home. Required 4 lpm on walking oximetry to maintain sats. Encouraged to restart Stiolto daily.   01/03/2022: OV with Eulan Heyward NP for follow up with her husband. She is doing better than when she is here last. Feels like her breathing has improved. Still gets short winded with longer distances and climbing. Lives a relatively sedentary lifestyle. No increased cough or chest congestion. She is now on immunotherapy. Having some trouble with orthostatic dizziness but otherwise, tolerating well. Pain on her left side from her surgery is unchanged; she was told by Dr. Roxan Hockey that it may never resolve. Switched from [employer] to [employer] after our last visit due to insurance coverage. Not using consistently. No acute concerns or complaints today.   02/20/2022: Today - follow up Patient presents today for hospital follow up. She was admitted 01/28/2022-02/08/2022 for  acute on chronic respiratory failure secondary to drug induced pneumonitis. She was discharged on prolonged prednisone taper, currently on 40 mg daily. She tells me that she is feeling better. Still doesn't feel like her breathing is quite back to her normal and she's more fatigued, but overall, feels like she has improved. She still has a cough, which is occasionally productive with white sputum. Denies any hemoptysis, fevers, chills, night sweats, lower extremity swelling. She is trying to wean her oxygen back down; currently using 3.5 lpm at home and 4 lpm on portable tanks. She was discharged on triple therapy nebs, which she is using. Also  restarted her Bevespi once she got home. She would like to see if she could get a face mask for her neb treatments.  Allergies  Allergen Reactions   Codeine Other (See Comments)    Unknown reaction Patient avoids this medication    Immunization History  Administered Date(s) Administered   Pneumococcal Polysaccharide-23 01/28/2019   Tdap 02/02/2019   Zoster Recombinat (Shingrix) 06/14/2019    Past Medical History:  Diagnosis Date   CAD (coronary artery disease)    Cancer (HCC)    COPD (chronic obstructive pulmonary disease) ([place])    per DM note, pt denies   Dyslipidemia    GERD (gastroesophageal reflux disease)    History of radiation therapy    Left lung- 01/16/21-01/30/21- Dr. Gery Pray   History of radiation therapy    Left lung- 08/02/21-09/18/21- Dr. Gery Pray   HTN (hypertension)    Hypothyroidism    Myocardial infarct [facility])    Non-small cell lung cancer ([place]) 08/19/2010   Stage IA, status post left upper lobectomy July 2012   Tobacco abuse     Tobacco History: Social History   Tobacco Use  Smoking Status Former   Packs/day: 1.00   Years: 58.00   Total pack years: 58.00   Types: Cigarettes   Quit date: 07/19/2021   Years since quitting: 0.5  Smokeless Tobacco Former  Tobacco Comments   Former smoker 02/19/22   Counseling given: Not Answered Tobacco comments: Former smoker 02/19/22   Outpatient Medications Prior to Visit  Medication Sig Dispense Refill   acetaminophen (TYLENOL) 500 MG tablet Take 500 mg by mouth every 6 (six) hours as needed for moderate pain or fever.     albuterol (PROAIR HFA) 108 (90 Base) MCG/ACT inhaler 2 puffs up to every 4 hours as needed only  if your can't catch your breath (Patient taking differently: Inhale 2 puffs into the lungs every 4 (four) hours as needed for wheezing or shortness of breath.)     albuterol (PROVENTIL) (2.5 MG/3ML) 0.083% nebulizer solution Take 2.5 mg by nebulization 3 (three) times daily as needed  for wheezing or shortness of breath.     arformoterol (BROVANA) 15 MCG/2ML NEBU Take 2 mLs (15 mcg total) by nebulization 2 (two) times daily. 120 mL 0   aspirin 81 MG tablet Take 81 mg by mouth at bedtime.     budesonide (PULMICORT) 0.5 MG/2ML nebulizer solution Take 2 mLs (0.5 mg total) by nebulization 2 (two) times daily. 36 mL 6   Coenzyme Q10 (CO Q 10 PO) Take 1 capsule by mouth daily.     furosemide (LASIX) 20 MG tablet Take 1 tablet po BID for 5 days then go back to taking 1 tablet po QD (Patient taking differently: Take 20 mg by mouth daily.) 90 tablet 1   ipratropium-albuterol (DUONEB) 0.5-2.5 (3) MG/3ML SOLN Take 3 mLs by  nebulization every 4 (four) hours as needed. 360 mL 1   KLOR-CON M10 10 MEQ tablet TAKE 1 TABLET BY MOUTH EVERY DAY (Patient taking differently: Take 10 mEq by mouth every other day.) 90 tablet 1   levothyroxine (SYNTHROID) 25 MCG tablet Take 0.5 tablets (12.5 mcg total) by mouth daily. In addition to the 50 mcg. 45 tablet 1   levothyroxine (SYNTHROID) 50 MCG tablet Take 1 tablet (50 mcg total) by mouth See admin instructions. Along with 12.5 mcg qd except on Sunday (pt just takes 50 mcg) 90 tablet 1   lidocaine-prilocaine (EMLA) cream Apply 1 Application topically as needed. (Patient taking differently: Apply 1 Application topically as needed (pain).) 30 g 2   metoprolol succinate (TOPROL-XL) 100 MG 24 hr tablet Take 1 tablet (100 mg total) by mouth daily. Take with or immediately following a meal. 90 tablet 1   naproxen sodium (ALEVE) 220 MG tablet Take 220 mg by mouth 2 (two) times daily as needed (pain).     Omega-3 Fatty Acids (OMEGA-3 PO) Take 1,000 mg by mouth in the morning and at bedtime.     predniSONE (DELTASONE) 10 MG tablet Take 5 tablets (50 mg total) by mouth daily for 7 days, THEN 4 tablets (40 mg total) daily for 7 days, THEN 3 tablets (30 mg total) daily for 7 days, THEN 2 tablets (20 mg total) daily. 204 tablet 0   revefenacin (YUPELRI) 175 MCG/3ML  nebulizer solution Take 3 mLs (175 mcg total) by nebulization daily. 90 mL 1   rosuvastatin (CRESTOR) 40 MG tablet Take 1 tablet (40 mg total) by mouth daily. 90 tablet 3   BEVESPI AEROSPHERE 9-4.8 MCG/ACT AERO Inhale 2 puffs into the lungs 2 (two) times daily.     sulfamethoxazole-trimethoprim (BACTRIM DS) 800-160 MG tablet Take 1 tablet by mouth every Monday, Wednesday, and Friday for 21 days. 9 tablet 0   No facility-administered medications prior to visit.     Review of Systems:   Constitutional: No weight loss or gain, night sweats, fevers, chills. +fatigue, lassitude (improving). HEENT: No headaches, difficulty swallowing, tooth/dental problems, or sore throat. No sneezing, itching, ear ache, nasal congestion, or post nasal drip CV:  No chest pain, orthopnea, PND, swelling in lower extremities, anasarca, dizziness, palpitations, syncope Resp: +shortness of breath with exertion; productive cough; chest congestion (improving). No excess mucus or change in color of mucus. No hemoptysis. No wheezing.  No chest wall deformity Skin: No rash, lesions, ulcerations MSK:  No joint pain or swelling.  No decreased range of motion.  No back pain. Neuro: No dizziness or lightheadedness.  Psych: No depression or anxiety. Mood stable.     Physical Exam:  BP 128/78   Pulse 77   Ht 5\' 3"  (1.6 m)   Wt 211 lb (95.7 kg)   SpO2 91% Comment: 4L  BMI 37.38 kg/m   GEN: Pleasant, interactive, chronically-ill appearing; obese; in no acute distress. HEENT:  Normocephalic and atraumatic. PERRLA. Sclera white. Nasal turbinates pink, moist and patent bilaterally. No rhinorrhea present. Oropharynx pink and moist, without exudate or edema. No lesions, ulcerations, or postnasal drip.  NECK:  Supple w/ fair ROM. No JVD present. Normal carotid impulses w/o bruits. Thyroid symmetrical with no goiter or nodules palpated. No lymphadenopathy.   CV: RRR, no m/r/g, no peripheral edema. Pulses intact, +2 bilaterally.  No cyanosis, pallor or clubbing. PULMONARY:  Unlabored, regular breathing. Scattered rhonchi bilaterally. No accessory muscle use. No dullness to percussion. GI: BS present and normoactive.  Soft, non-tender to palpation.  Neuro: A/Ox3. No focal deficits noted.   Skin: Warm, no lesions or rashe Psych: Normal affect and behavior. Judgement and thought content appropriate.     Lab Results:  CBC    Component Value Date/Time   WBC 18.4 (H) 02/14/2022 1050   WBC 18.8 (H) 02/06/2022 0318   RBC 4.04 02/14/2022 1050   HGB 11.3 (L) 02/14/2022 1050   HGB 13.3 05/02/2021 1055   HCT 36.3 02/14/2022 1050   HCT 40.8 05/02/2021 1055   PLT 179 02/14/2022 1050   PLT 220 05/02/2021 1055   MCV 89.9 02/14/2022 1050   MCV 88 05/02/2021 1055   MCH 28.0 02/14/2022 1050   MCHC 31.1 02/14/2022 1050   RDW 17.4 (H) 02/14/2022 1050   RDW 13.3 05/02/2021 1055   LYMPHSABS 0.8 02/14/2022 1050   LYMPHSABS 2.2 10/25/2020 0935   MONOABS 1.3 (H) 02/14/2022 1050   EOSABS 0.0 02/14/2022 1050   EOSABS 0.1 10/25/2020 0935   BASOSABS 0.0 02/14/2022 1050   BASOSABS 0.1 10/25/2020 0935    BMET    Component Value Date/Time   NA 144 02/14/2022 1050   NA 143 05/02/2021 1055   K 3.9 02/14/2022 1050   CL 99 02/14/2022 1050   CO2 41 (H) 02/14/2022 1050   GLUCOSE 91 02/14/2022 1050   BUN 19 02/14/2022 1050   BUN 13 05/02/2021 1055   CREATININE 0.85 02/14/2022 1050   CREATININE 0.81 07/02/2016 1323   CALCIUM 9.0 02/14/2022 1050   GFRNONAA >60 02/14/2022 1050   GFRAA 98 04/04/2020 1418    BNP    Component Value Date/Time   BNP 252.8 (H) 02/01/2022 1239     Imaging:  DG Chest 2 View  Result Date: 02/20/2022 CLINICAL DATA:  Pneumonitis.  Non-small cell lung cancer. EXAM: CHEST - 2 VIEW COMPARISON:  02/08/2022 and CT chest 01/28/2022. FINDINGS: Right IJ power port tip is in the SVC. Heart is enlarged, similar. Thoracic aorta is calcified. Increasing airspace opacification in the left perihilar region with  possible fluid in the left costophrenic angle. Mild interstitial prominence in the right lung. Elevated left hemidiaphragm. IMPRESSION: 1. Airspace opacification in the left perihilar region may be due to pneumonia. 2. Small left pleural effusion. 3. Interstitial prominence in the right lung, possibly due to edema. 4. Consider repeat CT chest with contrast in further evaluation, as clinically indicated. 5.  Aortic atherosclerosis (ICD10-I70.0). Electronically Signed   By: Lorin Picket M.D.   On: 02/20/2022 14:42   DG CHEST PORT 1 VIEW  Result Date: 02/08/2022 CLINICAL DATA:  Left upper lobectomy EXAM: PORTABLE CHEST 1 VIEW COMPARISON:  02/02/2022 FINDINGS: Left upper lobectomy with left lung volume loss. Left basilar airspace disease. Mild interstitial thickening of the right lung. Small left pleural effusion. No pneumothorax. Stable cardiomediastinal silhouette. Prior CABG. Right-sided Port-A-Cath in satisfactory position with the tip projecting over the SVC. No acute osseous abnormality. IMPRESSION: 1. Left upper lobectomy with left lung volume loss. Left basilar airspace disease which may reflect atelectasis versus pneumonia. Electronically Signed   By: Kathreen Devoid M.D.   On: 02/08/2022 15:05   US Abdomen Limited RUQ (LIVER/GB)  Result Date: 02/04/2022 CLINICAL DATA:  Elevated LFTs EXAM: ULTRASOUND ABDOMEN LIMITED RIGHT UPPER QUADRANT COMPARISON:  None Available. FINDINGS: Gallbladder: Nondilated gallbladder. Cholelithiasis with significant shadowing. No wall thickening. Negative sonographic Murphy sign. Largest gallstone measures up to 1.2 cm. Common bile duct: Diameter: 6.1 mm, normal.  No intrahepatic ductal dilation. Liver: Normal echogenicity. There is  a left hepatic cyst measuring up to 1.2 cm and a right hepatic cyst measuring up to 1.0 cm, as seen on recent CT. Portal vein is patent on color Doppler imaging with normal direction of blood flow towards the liver. Other: None. IMPRESSION:  Cholelithiasis. No evidence of acute cholecystitis or biliary obstruction. Unremarkable sonographic appearance of the liver. Electronically Signed   By: Maurine Simmering M.D.   On: 02/04/2022 20:57   DG CHEST PORT 1 VIEW  Result Date: 02/02/2022 CLINICAL DATA:  Respiratory failure EXAM: PORTABLE CHEST 1 VIEW COMPARISON:  01/28/2022 chest x-ray and CT, chest x-ray 08/07/2021 FINDINGS: Right-sided central venous port tip over the SVC. Post sternotomy changes. Postsurgical changes on the left. Pleural effusion and airspace disease are grossly unchanged. Improved aeration of right thorax compared to prior. Enlarged cardiomediastinal silhouette IMPRESSION: Improved aeration of the right thorax compared to prior. Pleural effusion and airspace disease on the left are grossly unchanged. Electronically Signed   By: Donavan Foil M.D.   On: 02/02/2022 17:07   CT Angio Chest PE W/Cm &/Or Wo Cm  Result Date: 01/28/2022 CLINICAL DATA:  Pulmonary embolism (PE) suspected, high prob. History of non-small cell lung cancer EXAM: CT ANGIOGRAPHY CHEST WITH CONTRAST TECHNIQUE: Multidetector CT imaging of the chest was performed using the standard protocol during bolus administration of intravenous contrast. Multiplanar CT image reconstructions and MIPs were obtained to evaluate the vascular anatomy. RADIATION DOSE REDUCTION: This exam was performed according to the departmental dose-optimization program which includes automated exposure control, adjustment of the mA and/or kV according to patient size and/or use of iterative reconstruction technique. CONTRAST:  36mL OMNIPAQUE IOHEXOL 350 MG/ML SOLN COMPARISON:  01/07/2022 FINDINGS: Cardiovascular: Satisfactory opacification of the pulmonary arteries. No evidence of pulmonary embolism to the lobar branch level. Evaluation of the more distal pulmonary arterial branches is limited in the setting of respiratory motion artifact. Thoracic aorta is nonaneurysmal. Scattered atherosclerotic  vascular calcifications of the aorta and coronary arteries. Prior sternotomy and CABG normal heart size. No pericardial effusion. Right chest port terminates at the level of the proximal right atrium. Mediastinum/Nodes: Prominent soft tissue in the left perihilar region is unchanged. Multiple mildly prominent mediastinal and right hilar lymph nodes have slightly increased the previous study, and may be reactive. No axillary lymphadenopathy. Thyroid, trachea, and esophagus demonstrate no significant findings. Lungs/Pleura: Prior left upper lobectomy with associated volume loss in the left hemithorax. Persistent irregular appearing small left pleural effusion. Progressive interstitial and alveolar opacities throughout the left lower lobe. There are new predominantly ground-glass opacities with interlobular septal thickening throughout the right lung. No right-sided pleural effusion. No pneumothorax. Upper Abdomen: No acute findings within the included upper abdomen. Musculoskeletal: No chest wall abnormality. No acute or significant osseous findings. Review of the MIP images confirms the above findings. IMPRESSION: 1. No evidence of pulmonary embolism to the lobar branch level. 2. New predominantly ground-glass opacities with interlobular septal thickening throughout the right lung, which may represent pulmonary edema or atypical infection. 3. Post-treatment changes from prior left upper lobectomy. Persistent irregular appearing small left pleural effusion. Progressive interstitial and alveolar opacities throughout the left lower lobe, which may represent edema, infection, versus lymphangitic spread of tumor. 4. Multiple mildly prominent mediastinal and right hilar lymph nodes have slightly increased the previous study, and may be reactive or metastatic. Attention on follow-up. 5. Aortic and coronary artery atherosclerosis (ICD10-I70.0). Electronically Signed   By: Davina Poke D.O.   On: 01/28/2022 19:43   DG  Chest  Port 1 View  Result Date: 01/28/2022 CLINICAL DATA:  Pt arrived via POV, c/o low oxygen. States had follow up in office, told oxygen was mid 68s. Pt states she had her oxygen increased to 5L [place] at home recently. EXAM: PORTABLE CHEST - 1 VIEW COMPARISON:  08/29/2021 FINDINGS: Interval decrease in left pleural effusion. Improved aeration of the left lung base although moderate scattered airspace opacities persist. There has been some increase in ill-defined alveolar opacities at the right lung base. Stable right IJ power port to the distal SVC. CABG markers. Heart size and mediastinal contours are within normal limits. Aortic Atherosclerosis (ICD10-170.0). No pneumothorax. Sternotomy wires. IMPRESSION: 1. Interval decrease in left pleural effusion. 2. Worsening right lower lung airspace disease. Electronically Signed   By: Lucrezia Europe M.D.   On: 01/28/2022 16:19    durvalumab (IMFINZI) 1,500 mg in sodium chloride 0.9 % 100 mL chemo infusion     Date Action Dose Route User   Discharged on 02/08/2022   Admitted on 01/28/2022   01/14/2022 1129 Infusion Verify (none) Intravenous Gillian Shields, RN   01/14/2022 1129 Rate/Dose Change (none) Intravenous Gillian Shields, RN   01/14/2022 1129 New Bag/Given 1,500 mg Intravenous Gillian Shields, RN      0.9 %  sodium chloride infusion     Date Action Dose Route User   Discharged on 02/08/2022   Admitted on 01/28/2022   01/14/2022 1242 Rate/Dose Change (none) Intravenous Gillian Shields, RN   01/14/2022 1237 Rate/Dose Change (none) Intravenous Gillian Shields, RN   01/14/2022 1048 New Bag/Given (none) Intravenous Marquis Lunch M, RN      sodium chloride flush (NS) 0.9 % injection 10 mL     Date Action Dose Route User   Discharged on 02/08/2022   Admitted on 01/28/2022   01/14/2022 0934 Given 10 mL Intracatheter Cates, Porsche L, LPN      sodium chloride flush (NS) 0.9 % injection 10 mL     Date Action Dose Route User   02/14/2022 1050 Given 10 mL  Lynnae January, RN          Latest Ref Rng & Units 09/11/2020   12:46 PM  PFT Results  FVC-Pre L 1.38   FVC-Predicted Pre % 47   FVC-Post L 1.44   FVC-Predicted Post % 50   Pre FEV1/FVC % % 83   Post FEV1/FCV % % 78   FEV1-Pre L 1.14   FEV1-Predicted Pre % 52   FEV1-Post L 1.12   DLCO uncorrected ml/min/mmHg 9.54   DLCO UNC% % 49   DLCO corrected ml/min/mmHg 9.38   DLCO COR %Predicted % 48   DLVA Predicted % 83   TLC L 3.02   TLC % Predicted % 59   RV % Predicted % 61     No results found for: "NITRICOXIDE"      Assessment & Plan:   Drug-induced pneumonitis Hospitalized 12/11-12/22 for drug induced pneumonitis and possible CAP. Completed abx course while admitted and discharged on prolonged steroid taper. She is currently on 40 mg daily, will decrease to 30 mg on 1/6. She will continue PJP prophylaxis with Bactrim M/W/F until she is down to 20 mg of prednisone daily. Clinically improving. F/u CXR today.   Patient Instructions  -Continue arformoterol 2 mL neb Twice daily  -Continue budesonide 2 mL Twice daily. Brush tongue and rinse mouth afterwards -Continue Yupelri 3 mL once daily Stop Bevespi. -Continue Albuterol inhaler 2 puffs or 3  mL neb every 6 hours as needed for shortness of breath or wheezing. Notify if symptoms persist despite rescue inhaler/neb use. -Continue supplemental oxygen 3-4 lpm with rest and 6 lpm with activity for goal >88-90%. Orders sent for Oxymizer cannula - you can use this with your portable tanks on 3 lpm -Continue prednisone taper - decrease by 10 mg every week until complete -Continue bactrim Monday, Wednesday and Friday until you are taking 20 mg of prednisone daily or less, then you can stop the bactrim   Chest x ray today   Follow up in 4-5 weeks with Dr. Melvyn Novas or Katie Nirvi Boehler,NP. If symptoms do not improve or worsen, please contact office for sooner follow up or seek emergency care.   Acute on chronic respiratory  failure with hypoxia and hypercapnia (HCC) Recent hospitalization with significant O2 requirements. Able to wean to 6 lpm upon discharge. She is currently only using between 3.5-4 lpm. States that most of the time her O2 levels are above 90% but she is still getting short winded with exertion, especially dressing. Walking oximetry today; required 6 lpm to maintain sats >88-90%. Reviewed importance of avoiding hypoxia and adjusting O2 accordingly. She verbalized understanding. She will increase O2 to 6 lpm with activity for goal >88-90%. We will order her an Oxymizer to help conserve O2 with portable tanks.  COPD GOLD III Severe COPD. Transitioned to triple therapy nebs upon discharge. She is still using Bevespi as well. Instructed to discontinue; medication education provided. She will continue with triple therapy regimen.   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 She completed chemo and radiation in August with significant clinical and radiographic improvement. She was transitioned to consolidation immunotherapy with Imfinzi. 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 She is currently under surveillance. Plan for repeat CT in February with Dr. Julien Nordmann.     I spent 28 minutes of dedicated to the care of this patient on the date of this encounter to include pre-visit review of records, face-to-face time with the patient discussing conditions above, post visit ordering of testing, clinical documentation with the electronic health record, making appropriate referrals as documented, and communicating necessary findings to members of the patients care team.  Clayton Bibles, NP 02/20/2022  Pt aware and understands NP's role.

## 2022-02-20 NOTE — Assessment & Plan Note (Signed)
Hospitalized 12/11-12/22 for drug induced pneumonitis and possible CAP. Completed abx course while admitted and discharged on prolonged steroid taper. She is currently on 40 mg daily, will decrease to 30 mg on 1/6. She will continue PJP prophylaxis with Bactrim M/W/F until she is down to 20 mg of prednisone daily. Clinically improving. F/u CXR today.   Patient Instructions  -Continue arformoterol 2 mL neb Twice daily  -Continue budesonide 2 mL Twice daily. Brush tongue and rinse mouth afterwards -Continue Yupelri 3 mL once daily Stop Bevespi. -Continue Albuterol inhaler 2 puffs or 3 mL neb every 6 hours as needed for shortness of breath or wheezing. Notify if symptoms persist despite rescue inhaler/neb use. -Continue supplemental oxygen 3-4 lpm with rest and 6 lpm with activity for goal >88-90%. Orders sent for Oxymizer cannula - you can use this with your portable tanks on 3 lpm -Continue prednisone taper - decrease by 10 mg every week until complete -Continue bactrim Monday, Wednesday and Friday until you are taking 20 mg of prednisone daily or less, then you can stop the bactrim   Chest x ray today   Follow up in 4-5 weeks with Dr. Melvyn Novas or Katie Jo-Anne Kluth,NP. If symptoms do not improve or worsen, please contact office for sooner follow up or seek emergency care.

## 2022-02-21 ENCOUNTER — Encounter: Payer: Self-pay | Admitting: Nurse Practitioner

## 2022-02-21 DIAGNOSIS — J9610 Chronic respiratory failure, unspecified whether with hypoxia or hypercapnia: Secondary | ICD-10-CM | POA: Diagnosis not present

## 2022-02-25 DIAGNOSIS — J9601 Acute respiratory failure with hypoxia: Secondary | ICD-10-CM | POA: Diagnosis not present

## 2022-03-01 NOTE — Progress Notes (Signed)
CXR showed that the left lung area looks a little worse than previous. If she is having any worsening symptoms, I will likely restart her on abx. If she is continuing to feel better, then we will repeat her CXR at her follow up. Thanks.

## 2022-03-08 ENCOUNTER — Other Ambulatory Visit: Payer: Self-pay

## 2022-03-08 ENCOUNTER — Telehealth: Payer: Self-pay | Admitting: Internal Medicine

## 2022-03-08 ENCOUNTER — Telehealth: Payer: Self-pay | Admitting: Nurse Practitioner

## 2022-03-08 MED ORDER — BUDESONIDE 0.5 MG/2ML IN SUSP: 0.5000 mg | Freq: Two times a day (BID) | RESPIRATORY_TRACT | 6 refills | Status: DC

## 2022-03-08 MED ORDER — REVEFENACIN 175 MCG/3ML IN SOLN: 175.0000 ug | Freq: Every day | RESPIRATORY_TRACT | 1 refills | Status: DC

## 2022-03-08 MED ORDER — ALBUTEROL SULFATE (2.5 MG/3ML) 0.083% IN NEBU: 2.5000 mg | INHALATION_SOLUTION | Freq: Three times a day (TID) | RESPIRATORY_TRACT | 3 refills | Status: DC | PRN

## 2022-03-08 NOTE — Telephone Encounter (Signed)
Spoke to patient she advised she needed her nebulizer medication filled. I have sent those to [facility]. Pt is aware nothing further needed.

## 2022-03-08 NOTE — Telephone Encounter (Signed)
Called patient regarding upcoming February appointments, patient is notified.

## 2022-03-08 NOTE — Telephone Encounter (Signed)
Patient would like the nurse to call regarding her medication refills.  CB# [phone]

## 2022-03-11 ENCOUNTER — Other Ambulatory Visit: Payer: Medicare HMO

## 2022-03-11 ENCOUNTER — Ambulatory Visit: Payer: Medicare HMO | Admitting: Internal Medicine

## 2022-03-11 ENCOUNTER — Telehealth: Payer: Self-pay | Admitting: Nurse Practitioner

## 2022-03-11 ENCOUNTER — Ambulatory Visit: Payer: Medicare HMO

## 2022-03-11 NOTE — Telephone Encounter (Signed)
PT states issues w/RX and wants to speak to Ms. Cobb or Triage. Pls call @ [phone]

## 2022-03-12 ENCOUNTER — Other Ambulatory Visit: Payer: Self-pay | Admitting: Internal Medicine

## 2022-03-13 MED ORDER — ARFORMOTEROL TARTRATE 15 MCG/2ML IN NEBU: 15.0000 ug | INHALATION_SOLUTION | Freq: Two times a day (BID) | RESPIRATORY_TRACT | 11 refills | Status: DC

## 2022-03-13 NOTE — Telephone Encounter (Signed)
Called patient and she states her Brovana nebulizer medication was never sent in for her. Verified pharmacy with her and resent it in. Nothing further needed

## 2022-03-19 ENCOUNTER — Ambulatory Visit (HOSPITAL_COMMUNITY)
Admission: RE | Admit: 2022-03-19 | Discharge: 2022-03-19 | Disposition: A | Discharge status: Home / Self Care | Payer: Medicare HMO | Source: Ambulatory Visit | Attending: Internal Medicine | Admitting: Internal Medicine

## 2022-03-19 DIAGNOSIS — C3490 Malignant neoplasm of unspecified part of unspecified bronchus or lung: Secondary | ICD-10-CM | POA: Insufficient documentation

## 2022-03-19 DIAGNOSIS — J439 Emphysema, unspecified: Secondary | ICD-10-CM | POA: Diagnosis not present

## 2022-03-19 MED ORDER — HEPARIN SOD (PORK) LOCK FLUSH 100 UNIT/ML IV SOLN
INTRAVENOUS | Status: AC
  Filled 2022-03-19: qty 5

## 2022-03-19 MED ORDER — SODIUM CHLORIDE (PF) 0.9 % IJ SOLN
INTRAMUSCULAR | Status: AC
  Filled 2022-03-19: qty 50

## 2022-03-19 MED ORDER — HEPARIN SOD (PORK) LOCK FLUSH 100 UNIT/ML IV SOLN
500.0000 [IU] | Freq: Once | INTRAVENOUS | Status: AC
  Administered 2022-03-19: 500 [IU] via INTRAVENOUS

## 2022-03-19 MED ORDER — IOHEXOL 300 MG/ML  SOLN
75.0000 mL | Freq: Once | INTRAMUSCULAR | Status: AC | PRN
  Administered 2022-03-19: 75 mL via INTRAVENOUS

## 2022-03-20 ENCOUNTER — Ambulatory Visit: Payer: Medicare HMO | Admitting: Nurse Practitioner

## 2022-03-20 ENCOUNTER — Encounter: Payer: Self-pay | Admitting: Nurse Practitioner

## 2022-03-20 VITALS — BP 88/60 | HR 94 | Ht 63.0 in | Wt 216.0 lb

## 2022-03-20 DIAGNOSIS — C3492 Malignant neoplasm of unspecified part of left bronchus or lung: Secondary | ICD-10-CM | POA: Diagnosis not present

## 2022-03-20 DIAGNOSIS — J984 Other disorders of lung: Secondary | ICD-10-CM | POA: Diagnosis not present

## 2022-03-20 DIAGNOSIS — I5032 Chronic diastolic (congestive) heart failure: Secondary | ICD-10-CM | POA: Diagnosis not present

## 2022-03-20 DIAGNOSIS — J9612 Chronic respiratory failure with hypercapnia: Secondary | ICD-10-CM | POA: Diagnosis not present

## 2022-03-20 DIAGNOSIS — J449 Chronic obstructive pulmonary disease, unspecified: Secondary | ICD-10-CM | POA: Diagnosis not present

## 2022-03-20 DIAGNOSIS — I959 Hypotension, unspecified: Secondary | ICD-10-CM

## 2022-03-20 DIAGNOSIS — J9611 Chronic respiratory failure with hypoxia: Secondary | ICD-10-CM

## 2022-03-20 LAB — CBC WITH DIFFERENTIAL/PLATELET
Basophils Absolute: 0 10*3/uL (ref 0.0–0.1)
Basophils Relative: 0.4 % (ref 0.0–3.0)
Eosinophils Absolute: 0.1 10*3/uL (ref 0.0–0.7)
Eosinophils Relative: 0.8 % (ref 0.0–5.0)
HCT: 34 % — ABNORMAL LOW (ref 36.0–46.0)
Hemoglobin: 10.9 g/dL — ABNORMAL LOW (ref 12.0–15.0)
Lymphocytes Relative: 21.2 % (ref 12.0–46.0)
Lymphs Abs: 2.2 10*3/uL (ref 0.7–4.0)
MCHC: 32 g/dL (ref 30.0–36.0)
MCV: 88.6 fl (ref 78.0–100.0)
Monocytes Absolute: 1 10*3/uL (ref 0.1–1.0)
Monocytes Relative: 9.6 % (ref 3.0–12.0)
Neutro Abs: 7.1 10*3/uL (ref 1.4–7.7)
Neutrophils Relative %: 68 % (ref 43.0–77.0)
Platelets: 280 10*3/uL (ref 150.0–400.0)
RBC: 3.83 Mil/uL — ABNORMAL LOW (ref 3.87–5.11)
RDW: 21.6 % — ABNORMAL HIGH (ref 11.5–15.5)
WBC: 10.5 10*3/uL (ref 4.0–10.5)

## 2022-03-20 LAB — BASIC METABOLIC PANEL
BUN: 17 mg/dL (ref 6–23)
CO2: 36 mEq/L — ABNORMAL HIGH (ref 19–32)
Calcium: 9.5 mg/dL (ref 8.4–10.5)
Chloride: 99 mEq/L (ref 96–112)
Creatinine, Ser: 1.26 mg/dL — ABNORMAL HIGH (ref 0.40–1.20)
GFR: 41.8 mL/min — ABNORMAL LOW (ref >60.00)
Glucose, Bld: 145 mg/dL — ABNORMAL HIGH (ref 70–99)
Potassium: 4.7 mEq/L (ref 3.5–5.1)
Sodium: 144 mEq/L (ref 135–145)

## 2022-03-20 LAB — BRAIN NATRIURETIC PEPTIDE: Pro B Natriuretic peptide (BNP): 84 pg/mL (ref 0.0–100.0)

## 2022-03-20 NOTE — Assessment & Plan Note (Signed)
Asymptomatic hypotension. She has no symptoms to correlate to infectious process. She appears euvolemic on exam. She is five pounds up; however suspect this is more so related to prolonged steroid use vs volume overload as recent imaging does not reflect this either. She is actually in a regular rhythm today, and rate is controlled. We will check labs today - BNP, BMET, CBC with diff. Advised her to hold diuretic therapy for the next 2-3 days and monitor BP/weights. Would be hesitant to adjust her metoprolol as she is not on any other antiarrhythmic currently. I have also sent a message to cardiology for their recommendations. She was provided with strict ED precautions and understands to make follow up with cardiology if her BP remains low.

## 2022-03-20 NOTE — Progress Notes (Signed)
@Patient  ID: [name], female    DOB: 1945/08/26, 77 y.o.   MRN: [id number]  No chief complaint on file.   Referring provider: [name], NP  HPI: 77 year old female, active smoker followed for COPD, left recurrent lung cancer, left lung collapse.  She is a patient Dr. Gustavus Zimmerman and last seen in office on 02/20/2022 by Megan [facility] NP.  Past medical history significant for CAD, CHF, PAF, hypertension, GERD, hypothyroidism, IDA, HLD.  She has a history of left upper lobe adenocarcinoma post lobectomy 2012.  Found to have an enlarging left lower lobe nodule in 2022 and underwent SBRT with resolution.  In May 2023, she was found to have a hypermetabolic large left hilar mass with associated left-sided volume loss.  She underwent bronchoscopy with Dr. Roxan Zimmerman on 6/9 and was diagnosed with recurrent non-small cell cancer.  The following day she was admitted to [facility] for leg swelling and shortness of breath.  She was found to have a large left-sided pleural effusion which chest tube was placed for.  Effusion did not resolve despite drainage and diuresis.  She was started on steroids and discharged with plans to see radiation oncology the same day.  Radiation was started 6/15 and was repeated daily with plans to start chemotherapy in near future.  She presented to radiation therapy on 6/20 and was noted to be tachycardic-sent to [facility] long ED and found to be in SVT.  CXR during the stay was also significant for complete opacification of the left hemithorax; no intervention.  She was treated with IV metoprolol and discharged home.  She went back to the ED on 6/21 with palpitations and hot flashes.  She was found to be in SVT again and given 2 doses of adenosine, which was unsuccessful.  She then received IV beta-blocker with rate control and A-fib was seen; cardiology consulted.  During her hospital stay in June 2023, a repeat CT scan showed full left lung atelectasis, proximal left hilar mass and  pleural fluid with suspected partial loculations.  PCCM was consulted to discuss possible intervention.  It was decided that there was no indication for chest tube placement as she did not have any previous improvement with this due to proximal obstruction.  There was also no role for VATS decortication.  Recommended waiting until she completed radiation and started chemotherapy to reevaluate to see if proximal obstruction had resolved/improved and if she would be a candidate for VATS or Pleurx catheter. She was discharged on 6/24 with plans to follow up outpatient.   She went back to the hospital 01/28/2022 with increasing oxygen requirements and was admitted for acute on chronic respiratory failure secondary to drug induced pneumonitis, possibly related to amiodarone and immunotherapy Imfinzi. Question possible pneumonia as well - treated with rocephin and azithromycin. She was treated with high dose steroids and transitioned to prolonged taper upon discharge as well as Bactim for PJP prophylaxis.   TEST/EVENTS:  04/2015 spiro: ratio 59, FEV1 39 09/11/2020 PFTs: FVC 47, FEV1 52, ratio 78, TLC 59, DLCOcor 48 08/07/2021 CT chest with contrast: Atherosclerosis/CAD.  There is no evidence of central or segmental PE.  The known left hilar mass is poorly visualized and measuring up to 4 cm.  There is interval complete hypodense heterogeneous opacification of the left lung.  There is interval development of at least a small volume of left pleural fluid.  The right lung is clear. 10/06/2021 CT chest with contrast: some regression of large neoplasm in left lung  with re-expansion, indicating positive response to therapy. [name] thick-walled cavitary lesion and LUL nodule. Fullness in the left hilar region, likely residual malignant tissue and/or LAD. Small partially loculated pleural effusion, decreased in size, likely malignant.   08/22/2021: [name] with Megan Goyne NP for hospital follow-up.  Since she was discharged, she has started  concurrent chemoradiation.  She completed cycle 1 of carboplatin/paclitaxel and did not have any major side effects.  She started cycle 2 this week. She reports that her breathing is overall stable since discharge, possibly with slight improvement in activity tolerance. She does not feel back to her baseline and still gets winded with daily activities such as dressing. She also has begun to cough up some clear sputum again, which is normal for her. She denies any hemoptysis, wheezing, fevers, night sweats, weight loss. She rarely uses her albuterol rescue or nebulizer, but has felt like the nebulizer helps in the past. She was also previously on Incruse but stopped it because she wasn't sure how to use it properly. She did feel like it helped her breathing though. Started on [employer] - instructed on proper use. I did educate her that this may be of little benefit right now given the proximal obstructing mass; however, hopefully if this is resolved/improved, she will notice a difference in her baseline dyspnea. She was unable to maintain saturations on pulsed O2. Instructed she will need to use 4 lpm continuous - sent best fit portable. Re-evaluate use of POC in the future. Check outpatient ABG to determine need for BiPAP/NIV   10/03/2021: OV with Megan Pitera NP for follow up. Since I saw her last, she has completed radiation therapy on 8/1 and cycle 6 of chemo, which will be her final. She should have upcoming CT chest next week. Today, she reports that her breathing has improved. She still has dyspnea with minimal exertion but feels as though she can do more activities such as dressing herself and eating without becoming as winded. She is feeling better overall actually. Feels like she tolerated chemo/radiation without much difficulty. She does have some occasional pain in her left breast that comes and goes, related to radiation. Her cough is at baseline, usually just dry and doesn't bother her much. She occasionally  notices some wheezing. She has not been using her Stiolto regularly. She rarely uses her rescue. She continues on supplemental oxygen - using POC at 3 lpm. Not routinely monitoring her oxygen at home. Required 4 lpm on walking oximetry to maintain sats. Encouraged to restart Stiolto daily.   01/03/2022: OV with Zailen Albarran NP for follow up with her husband. She is doing better than when she is here last. Feels like her breathing has improved. Still gets short winded with longer distances and climbing. Lives a relatively sedentary lifestyle. No increased cough or chest congestion. She is now on immunotherapy. Having some trouble with orthostatic dizziness but otherwise, tolerating well. Pain on her left side from her surgery is unchanged; she was told by Dr. Roxan Zimmerman that it may never resolve. Switched from [employer] to [employer] after our last visit due to insurance coverage. Not using consistently. No acute concerns or complaints today.   02/20/2022: [name] with Raykwon Hobbs NP for hospital follow up. She was admitted 01/28/2022-02/08/2022 for acute on chronic respiratory failure secondary to drug induced pneumonitis. She was discharged on prolonged prednisone taper, currently on 40 mg daily. She tells me that she is feeling better. Still doesn't feel like her breathing is quite back to her normal and she's  more fatigued, but overall, feels like she has improved. She still has a cough, which is occasionally productive with white sputum. Denies any hemoptysis, fevers, chills, night sweats, lower extremity swelling. She is trying to wean her oxygen back down; currently using 3.5 lpm at home and 4 lpm on portable tanks. She was discharged on triple therapy nebs, which she is using. Also restarted her Bevespi once she got home. She would like to see if she could get a face mask for her neb treatments.  03/20/2022: Today - follow up Patient presents today for follow up with her husband. Since she was here last, she completed prednisone  taper and bactrim. She feels like her breathing has been stable since completing it. Shortness of breath is at her baseline. She denies any increased cough or chest congestion. She has gained about 5 lb since she was here last. Her feet do become swollen if she doesn't elevate them, but this is normal for her. She doesn't feel like swelling is any worse. No orthopnea. She is also having some lower blood pressure readings. Usually between 100-120. Last night when they checked at home, she had systolics in the 17'P. She was again noted to be 88/60 upon initial workup in office. She denies lightheadedness/dizziness, palpitations, syncope. She takes her metoprolol and lasix daily; last time was this morning.   Allergies  Allergen Reactions   Codeine Other (See Comments)    Unknown reaction Patient avoids this medication    Immunization History  Administered Date(s) Administered   Pneumococcal Polysaccharide-23 01/28/2019   Tdap 02/02/2019   Zoster Recombinat (Shingrix) 06/14/2019    Past Medical History:  Diagnosis Date   CAD (coronary artery disease)    Cancer (HCC)    COPD (chronic obstructive pulmonary disease) ([place])    per DM note, pt denies   Dyslipidemia    GERD (gastroesophageal reflux disease)    History of radiation therapy    Left lung- 01/16/21-01/30/21- Dr. Gery Pray   History of radiation therapy    Left lung- 08/02/21-09/18/21- Dr. Gery Pray   HTN (hypertension)    Hypothyroidism    Myocardial infarct [facility])    Non-small cell lung cancer ([place]) 08/19/2010   Stage IA, status post left upper lobectomy July 2012   Tobacco abuse     Tobacco History: Social History   Tobacco Use  Smoking Status Former   Packs/day: 1.00   Years: 58.00   Total pack years: 58.00   Types: Cigarettes   Quit date: 07/19/2021   Years since quitting: 0.6  Smokeless Tobacco Former  Tobacco Comments   Former smoker 02/19/22   Counseling given: Not Answered Tobacco comments: Former  smoker 02/19/22   Outpatient Medications Prior to Visit  Medication Sig Dispense Refill   acetaminophen (TYLENOL) 500 MG tablet Take 500 mg by mouth every 6 (six) hours as needed for moderate pain or fever.     arformoterol (BROVANA) 15 MCG/2ML NEBU Take 2 mLs (15 mcg total) by nebulization 2 (two) times daily. 120 mL 11   aspirin 81 MG tablet Take 81 mg by mouth at bedtime.     budesonide (PULMICORT) 0.5 MG/2ML nebulizer solution Take 2 mLs (0.5 mg total) by nebulization 2 (two) times daily. 36 mL 6   Coenzyme Q10 (CO Q 10 PO) Take 1 capsule by mouth daily.     furosemide (LASIX) 20 MG tablet Take 1 tablet po BID for 5 days then go back to taking 1 tablet po QD (Patient taking  differently: Take 20 mg by mouth daily.) 90 tablet 1   ipratropium-albuterol (DUONEB) 0.5-2.5 (3) MG/3ML SOLN Take 3 mLs by nebulization every 4 (four) hours as needed. 360 mL 1   KLOR-CON M10 10 MEQ tablet TAKE 1 TABLET BY MOUTH EVERY DAY (Patient taking differently: Take 10 mEq by mouth every other day.) 90 tablet 1   levothyroxine (SYNTHROID) 25 MCG tablet Take 0.5 tablets (12.5 mcg total) by mouth daily. In addition to the 50 mcg. 45 tablet 1   levothyroxine (SYNTHROID) 50 MCG tablet Take 1 tablet (50 mcg total) by mouth See admin instructions. Along with 12.5 mcg qd except on Sunday (pt just takes 50 mcg) 90 tablet 1   lidocaine-prilocaine (EMLA) cream Apply 1 Application topically as needed. (Patient taking differently: Apply 1 Application topically as needed (pain).) 30 g 2   metoprolol succinate (TOPROL-XL) 100 MG 24 hr tablet Take 1 tablet (100 mg total) by mouth daily. Take with or immediately following a meal. 90 tablet 1   naproxen sodium (ALEVE) 220 MG tablet Take 220 mg by mouth 2 (two) times daily as needed (pain).     Omega-3 Fatty Acids (OMEGA-3 PO) Take 1,000 mg by mouth in the morning and at bedtime.     revefenacin (YUPELRI) 175 MCG/3ML nebulizer solution Take 3 mLs (175 mcg total) by nebulization daily.  90 mL 1   rosuvastatin (CRESTOR) 40 MG tablet Take 1 tablet (40 mg total) by mouth daily. 90 tablet 3   albuterol (PROAIR HFA) 108 (90 Base) MCG/ACT inhaler 2 puffs up to every 4 hours as needed only  if your can't catch your breath (Patient not taking: Reported on 03/20/2022)     albuterol (PROVENTIL) (2.5 MG/3ML) 0.083% nebulizer solution Take 3 mLs (2.5 mg total) by nebulization 3 (three) times daily as needed for wheezing or shortness of breath. (Patient not taking: Reported on 03/20/2022) 75 mL 3   predniSONE (DELTASONE) 10 MG tablet Take 5 tablets (50 mg total) by mouth daily for 7 days, THEN 4 tablets (40 mg total) daily for 7 days, THEN 3 tablets (30 mg total) daily for 7 days, THEN 2 tablets (20 mg total) daily. 204 tablet 0   No facility-administered medications prior to visit.     Review of Systems:   Constitutional: No weight loss or gain, night sweats, fevers, chills, lassitude. +fatigue (baseline). HEENT: No headaches, difficulty swallowing, tooth/dental problems, or sore throat. No sneezing, itching, ear ache, nasal congestion, or post nasal drip CV:  +swelling in lower extremities (baseline). No chest pain, orthopnea, PND, anasarca, dizziness, palpitations, syncope Resp: +shortness of breath with exertion (baseline); occasional AM productive cough; No chest congestion. No excess mucus or change in color of mucus. No hemoptysis. No wheezing.  No chest wall deformity GI:  No heartburn, indigestion, abdominal pain, nausea, vomiting, diarrhea, change in bowel habits, loss of appetite, bloody stools.  GU: No dysuria, change in color of urine, urgency or frequency.  No flank pain, no hematuria  Skin: No rash, lesions, ulcerations MSK:  No joint pain or swelling.   Neuro: No dizziness or lightheadedness.  Psych: No depression or anxiety. Mood stable.     Physical Exam:  BP (!) 88/60   Pulse 94   Ht 5\' 3"  (1.6 m)   Wt 216 lb (98 kg)   SpO2 90% Comment: 4  BMI 38.26 kg/m    GEN: Pleasant, interactive, chronically-ill appearing; obese; in no acute distress. HEENT:  Normocephalic and atraumatic. PERRLA. Sclera white. Nasal  turbinates pink, moist and patent bilaterally. No rhinorrhea present. Oropharynx pink and moist, without exudate or edema. No lesions, ulcerations, or postnasal drip.  NECK:  Supple w/ fair ROM. No JVD present. Normal carotid impulses w/o bruits. Thyroid symmetrical with no goiter or nodules palpated. No lymphadenopathy.   CV: RRR, no m/r/g, no peripheral edema. Pulses intact, +2 bilaterally. No cyanosis, pallor or clubbing. PULMONARY:  Unlabored, regular breathing. Diminished bases otherwise clear b/l A&P w/o wheezes/rales/rhonchi. No accessory muscle use. No dullness to percussion. GI: BS present and normoactive. Soft, non-tender to palpation.  Neuro: A/Ox3. No focal deficits noted.   Skin: Warm, no lesions or rashe Psych: Normal affect and behavior. Judgement and thought content appropriate.     Lab Results:  CBC    Component Value Date/Time   WBC 10.5 03/20/2022 1424   RBC 3.83 (L) 03/20/2022 1424   HGB 10.9 (L) 03/20/2022 1424   HGB 11.3 (L) 02/14/2022 1050   HGB 13.3 05/02/2021 1055   HCT 34.0 (L) 03/20/2022 1424   HCT 40.8 05/02/2021 1055   PLT 280.0 03/20/2022 1424   PLT 179 02/14/2022 1050   PLT 220 05/02/2021 1055   MCV 88.6 03/20/2022 1424   MCV 88 05/02/2021 1055   MCH 28.0 02/14/2022 1050   MCHC 32.0 03/20/2022 1424   RDW 21.6 (H) 03/20/2022 1424   RDW 13.3 05/02/2021 1055   LYMPHSABS 2.2 03/20/2022 1424   LYMPHSABS 2.2 10/25/2020 0935   MONOABS 1.0 03/20/2022 1424   EOSABS 0.1 03/20/2022 1424   EOSABS 0.1 10/25/2020 0935   BASOSABS 0.0 03/20/2022 1424   BASOSABS 0.1 10/25/2020 0935    BMET    Component Value Date/Time   NA 144 03/20/2022 1424   NA 143 05/02/2021 1055   K 4.7 03/20/2022 1424   CL 99 03/20/2022 1424   CO2 36 (H) 03/20/2022 1424   GLUCOSE 145 (H) 03/20/2022 1424   BUN 17 03/20/2022 1424    BUN 13 05/02/2021 1055   CREATININE 1.26 (H) 03/20/2022 1424   CREATININE 0.85 02/14/2022 1050   CREATININE 0.81 07/02/2016 1323   CALCIUM 9.5 03/20/2022 1424   GFRNONAA >60 02/14/2022 1050   GFRAA 98 04/04/2020 1418    BNP    Component Value Date/Time   BNP 252.8 (H) 02/01/2022 1239     Imaging:  CT Chest W Contrast  Result Date: 03/19/2022 CLINICAL DATA:  Non-small-cell lung cancer. Restaging. * Tracking Code: BO * EXAM: CT CHEST WITH CONTRAST TECHNIQUE: Multidetector CT imaging of the chest was performed during intravenous contrast administration. RADIATION DOSE REDUCTION: This exam was performed according to the departmental dose-optimization program which includes automated exposure control, adjustment of the mA and/or kV according to patient size and/or use of iterative reconstruction technique. CONTRAST:  47mL OMNIPAQUE IOHEXOL 300 MG/ML  SOLN COMPARISON:  Chest CTA 01/28/2022.  Chest CT 01/07/2022. FINDINGS: Cardiovascular: The heart size is normal. No substantial pericardial effusion. Coronary artery calcification is evident. Moderate atherosclerotic calcification is noted in the wall of the thoracic aorta. Status post CABG. Right Port-A-Cath tip is positioned in the distal SVC. Mediastinum/Nodes: No mediastinal lymphadenopathy. There is no hilar lymphadenopathy. The esophagus has normal imaging features. There is no axillary lymphadenopathy. Lungs/Pleura: Centrilobular and paraseptal emphysema evident. No suspicious pulmonary nodule or mass in the right lung. Status post left upper lobectomy. Pleuroparenchymal scarring in the left lung is similar to prior, likely treatment related. Stable chronic pleural fluid/thickening. Upper Abdomen: Tiny hypodensities in the lateral segment left liver and right liver  adjacent to the gallbladder fossa are stable, consistent with benign etiology such as cysts. No followup imaging is recommended. Stable appearance of mild gallbladder wall  irregularity without substantial pericholecystic edema or fluid. No calcified gallstones evident. Ill-defined hypoenhancing lesion is seen in the posterior interpolar left kidney measuring 3.6 x 2.8 x 3.4 cm (axial image 155/2). This finding is more prominent than on the most recent comparison study of 01/28/2022 and was not included on the exam from 01/07/2022. Musculoskeletal: No worrisome lytic or sclerotic osseous abnormality. IMPRESSION: 1. Status post left upper lobectomy. Persistent chronic left pleural fluid and/or pleural thickening. The extensive septal thickening in the left lung is similar to prior and may be treatment related although lymphangitic tumor could have this appearance. Generally, imaging features are stable in the interval. 2. Ill-defined hypoenhancing lesion in the posterior interpolar left kidney measuring 3.6 x 2.8 x 3.4 cm. This finding is more prominent than on the most recent comparison study of 01/28/2022 and was not included on the exam from 01/07/2022. Imaging features are concerning for neoplasm. CT abdomen with and without contrast or MRI of the abdomen with and without contrast recommended to further evaluate. 3. Stable appearance of mild gallbladder wall irregularity without substantial pericholecystic edema or fluid. No calcified gallstones evident. 4. Aortic Atherosclerosis (ICD10-I70.0) and Emphysema (ICD10-J43.9). Electronically Signed   By: [name] M.D.   On: 03/19/2022 11:08    sodium chloride flush (NS) 0.9 % injection 10 mL     Date Action Dose Route User   02/14/2022 1050 Given 10 mL Lynnae January, RN          Latest Ref Rng & Units 09/11/2020   12:46 PM  PFT Results  FVC-Pre L 1.38   FVC-Predicted Pre % 47   FVC-Post L 1.44   FVC-Predicted Post % 50   Pre FEV1/FVC % % 83   Post FEV1/FCV % % 78   FEV1-Pre L 1.14   FEV1-Predicted Pre % 52   FEV1-Post L 1.12   DLCO uncorrected ml/min/mmHg 9.54   DLCO UNC% % 49   DLCO corrected  ml/min/mmHg 9.38   DLCO COR %Predicted % 48   DLVA Predicted % 83   TLC L 3.02   TLC % Predicted % 59   RV % Predicted % 61     No results found for: "NITRICOXIDE"      Assessment & Plan:   Drug-induced pneumonitis Hospitalized 12/11-12/22 for drug induced pneumonitis and possible CAP. Completed abx course while admitted and discharged on prolonged steroid taper, which she completed last week. She is clinically improved and stable since coming off oral steroids. Recent CT chest from 03/19/2022 showed significant improvement in right lung and stable changes in the left.   Patient Instructions  -Continue arformoterol 2 mL neb Twice daily  -Continue budesonide 2 mL Twice daily. Brush tongue and rinse mouth afterwards -Continue Yupelri 3 mL once daily -Continue Albuterol inhaler 2 puffs or 3 mL neb every 6 hours as needed for shortness of breath or wheezing. Notify if symptoms persist despite rescue inhaler/neb use. -Continue supplemental oxygen 3-4 lpm with rest and 6 lpm with activity for goal >88-90%. Orders sent for Oxymizer cannula - you can use this with your portable tanks on 3 lpm  Hold your lasix for the next 2-3 days. Monitor your weights at home and your blood pressure. Goal blood pressure greater than 100/60 and less than 140/90. If you gain 2-3 lb overnight, resume your lasix. I  will also send a message to your heart doctor so they can determine if adjustments to your metoprolol need to be made  Labs today   Follow up in 6-8 weeks with Dr. Melvyn Novas or Alanson Aly. If symptoms do not improve or worsen, please contact office for sooner follow up or seek emergency care.   COPD GOLD III Severe COPD. Transitioned to triple therapy nebs upon discharge; doing well on these. Action plan in place.  Chronic respiratory failure with hypoxia and hypercapnia (HCC) Clinically improved. Previously requiring up to 6 lpm with activity. She was able to maintain saturations on 2 lpm via  Oxymizer (4 lpm equivalent) in office today. 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 She completed chemo and radiation in August with significant clinical and radiographic improvement. She was transitioned to consolidation immunotherapy with Imfinzi. 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 She is currently under surveillance. Recent repeat CT with stable changes in the left lung, resolving/improving pneumonitis, and resolved LAD. She does have a new lesion on the left kidney, which is concerning for neoplasm. She has follow up with Dr. Julien Nordmann scheduled for tomorrow to discuss results/next steps.   Hypotension Asymptomatic hypotension. She has no symptoms to correlate to infectious process. She appears euvolemic on exam. She is five pounds up; however suspect this is more so related to prolonged steroid use vs volume overload as recent imaging does not reflect this either. She is actually in a regular rhythm today, and rate is controlled. We will check labs today - BNP, BMET, CBC with diff. Advised her to hold diuretic therapy for the next 2-3 days and monitor BP/weights. Would be hesitant to adjust her metoprolol as she is not on any other antiarrhythmic currently. I have also sent a message to cardiology for their recommendations. She was provided with strict ED precautions and understands to make follow up with cardiology if her BP remains low.    I spent 42 minutes of dedicated to the care of this patient on the date of this encounter to include pre-visit review of records, face-to-face time with the patient discussing conditions above, post visit ordering of testing, clinical documentation with the electronic health record, making appropriate referrals as  documented, and communicating necessary findings to members of the patients care team.  Clayton Bibles, NP 03/22/2022  Pt aware and understands NP's role.

## 2022-03-20 NOTE — Assessment & Plan Note (Signed)
Clinically improved. Previously requiring up to 6 lpm with activity. She was able to maintain saturations on 2 lpm via Oxymizer (4 lpm equivalent) in office today. Advised to continue monitoring levels at home for goal >88-90%

## 2022-03-20 NOTE — Assessment & Plan Note (Signed)
She was hospitalized in June for complete left lung atelectasis due to obstructing hilar mass. She completed chemo and radiation in August with significant clinical and radiographic improvement. She was transitioned to consolidation immunotherapy with Imfinzi. 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 She is currently under surveillance. Recent repeat CT with stable changes in the left lung, resolving/improving pneumonitis, and resolved LAD. She does have a new lesion on the left kidney, which is concerning for neoplasm. She has follow up with Dr. Julien Nordmann scheduled for tomorrow to discuss results/next steps.

## 2022-03-20 NOTE — Patient Instructions (Addendum)
-  Continue arformoterol 2 mL neb Twice daily  -Continue budesonide 2 mL Twice daily. Brush tongue and rinse mouth afterwards -Continue Yupelri 3 mL once daily -Continue Albuterol inhaler 2 puffs or 3 mL neb every 6 hours as needed for shortness of breath or wheezing. Notify if symptoms persist despite rescue inhaler/neb use. -Continue supplemental oxygen 3-4 lpm with rest and 4-6 lpm with activity for goal >88-90%. Orders sent for Oxymizer cannula - you can use this with your portable tanks on 2-3 lpm  Hold your lasix for the next 2-3 days. Monitor your weights at home and your blood pressure. Goal blood pressure greater than 100/60 and less than 140/90. If you gain 2-3 lb overnight, resume your lasix. I will also send a message to your heart doctor so they can determine if adjustments to your metoprolol need to be made  Labs today   Follow up in 6-8 weeks with Dr. Melvyn Novas or Alanson Aly. If symptoms do not improve or worsen, please contact office for sooner follow up or seek emergency care.

## 2022-03-20 NOTE — Assessment & Plan Note (Signed)
Hospitalized 12/11-12/22 for drug induced pneumonitis and possible CAP. Completed abx course while admitted and discharged on prolonged steroid taper, which she completed last week. She is clinically improved and stable since coming off oral steroids. Recent CT chest from 03/19/2022 showed significant improvement in right lung and stable changes in the left.   Patient Instructions  -Continue arformoterol 2 mL neb Twice daily  -Continue budesonide 2 mL Twice daily. Brush tongue and rinse mouth afterwards -Continue Yupelri 3 mL once daily -Continue Albuterol inhaler 2 puffs or 3 mL neb every 6 hours as needed for shortness of breath or wheezing. Notify if symptoms persist despite rescue inhaler/neb use. -Continue supplemental oxygen 3-4 lpm with rest and 6 lpm with activity for goal >88-90%. Orders sent for Oxymizer cannula - you can use this with your portable tanks on 3 lpm  Hold your lasix for the next 2-3 days. Monitor your weights at home and your blood pressure. Goal blood pressure greater than 100/60 and less than 140/90. If you gain 2-3 lb overnight, resume your lasix. I will also send a message to your heart doctor so they can determine if adjustments to your metoprolol need to be made  Labs today   Follow up in 6-8 weeks with Dr. Melvyn Novas or Alanson Aly. If symptoms do not improve or worsen, please contact office for sooner follow up or seek emergency care.

## 2022-03-20 NOTE — Assessment & Plan Note (Signed)
Severe COPD. Transitioned to triple therapy nebs upon discharge; doing well on these. Action plan in place.

## 2022-03-21 ENCOUNTER — Ambulatory Visit: Payer: Medicare HMO | Admitting: Internal Medicine

## 2022-03-21 ENCOUNTER — Inpatient Hospital Stay: Payer: Medicare HMO

## 2022-03-21 ENCOUNTER — Inpatient Hospital Stay: Payer: Medicare HMO | Attending: Internal Medicine | Admitting: Internal Medicine

## 2022-03-21 ENCOUNTER — Ambulatory Visit: Payer: Medicare HMO

## 2022-03-21 ENCOUNTER — Other Ambulatory Visit: Payer: Medicare HMO

## 2022-03-21 VITALS — BP 104/60 | HR 90 | Temp 99.1°F | Resp 17 | Wt 216.2 lb

## 2022-03-21 DIAGNOSIS — N2889 Other specified disorders of kidney and ureter: Secondary | ICD-10-CM | POA: Insufficient documentation

## 2022-03-21 DIAGNOSIS — I10 Essential (primary) hypertension: Secondary | ICD-10-CM | POA: Diagnosis not present

## 2022-03-21 DIAGNOSIS — C3492 Malignant neoplasm of unspecified part of left bronchus or lung: Secondary | ICD-10-CM | POA: Diagnosis not present

## 2022-03-21 DIAGNOSIS — Z923 Personal history of irradiation: Secondary | ICD-10-CM | POA: Diagnosis not present

## 2022-03-21 DIAGNOSIS — J449 Chronic obstructive pulmonary disease, unspecified: Secondary | ICD-10-CM | POA: Diagnosis not present

## 2022-03-21 DIAGNOSIS — C3490 Malignant neoplasm of unspecified part of unspecified bronchus or lung: Secondary | ICD-10-CM

## 2022-03-21 NOTE — Progress Notes (Signed)
[name] [phone]   [phone]  OFFICE PROGRESS NOTE  Megan Freshwater, NP [place] [address]  DIAGNOSIS:  Stage IIIa (T3, N2, M0) non-small cell lung cancer, squamous cell carcinoma presented with left hilar mass with suspicious mediastinal lymphadenopathy on the last CT scan of the chest at [place].  PRIOR THERAPY:  1) A course of concurrent chemoradiation with weekly carboplatin for AUC of 2 and paclitaxel 45 Mg/M2.  Status post 6 cycles. 2) Consolidation treatment with immunotherapy with Imfinzi 1500 Mg IV every 4 weeks.  First dose October 18, 2021.  Status post 4 cycles.  Last dose was given on January 14, 2022 discontinued secondary to suspicious immunotherapy mediated pneumonitis  CURRENT THERAPY: Observation.  INTERVAL HISTORY: [name] 77 y.o. female returns to the clinic today for follow-up visit accompanied by her husband.  The patient continues to have the baseline shortness of breath and she is currently on home oxygen.  She was treated with a tapered dose of prednisone over the last few weeks because of suspicious immunotherapy mediated pneumonitis.  She does not have any chest pain but continues to have cough with no hemoptysis.  She has no nausea, vomiting, diarrhea or constipation.  She has no headache or visual changes.  She has no recent weight loss or night sweats.  She had repeat CT scan of the chest performed recently and she is here for evaluation and discussion of her scan results.   MEDICAL HISTORY: Past Medical History:  Diagnosis Date   CAD (coronary artery disease)    Cancer ([facility])    COPD (chronic obstructive pulmonary disease) ([facility])    per DM note, pt denies   Dyslipidemia    GERD (gastroesophageal reflux disease)    History of radiation therapy    Left lung- 01/16/21-01/30/21- Dr. Gery Pray   History of radiation therapy    Left lung- 08/02/21-09/18/21- Dr. Gery Pray   HTN (hypertension)    Hypothyroidism    Myocardial infarct [facility])    Non-small cell lung cancer ([facility]) 08/19/2010   Stage IA, status post left upper lobectomy July 2012   Tobacco abuse     ALLERGIES:  is allergic to codeine.  MEDICATIONS:  Current Outpatient Medications  Medication Sig Dispense Refill   acetaminophen (TYLENOL) 500 MG tablet Take 500 mg by mouth every 6 (six) hours as needed for moderate pain or fever.     albuterol (PROAIR HFA) 108 (90 Base) MCG/ACT inhaler 2 puffs up to every 4 hours as needed only  if your can't catch your breath (Patient not taking: Reported on 03/20/2022)     albuterol (PROVENTIL) (2.5 MG/3ML) 0.083% nebulizer solution Take 3 mLs (2.5 mg total) by nebulization 3 (three) times daily as needed for wheezing or shortness of breath. (Patient not taking: Reported on 03/20/2022) 75 mL 3   arformoterol (BROVANA) 15 MCG/2ML NEBU Take 2 mLs (15 mcg total) by nebulization 2 (two) times daily. 120 mL 11   aspirin 81 MG tablet Take 81 mg by mouth at bedtime.     budesonide (PULMICORT) 0.5 MG/2ML nebulizer solution Take 2 mLs (0.5 mg total) by nebulization 2 (two) times daily. 36 mL 6   Coenzyme Q10 (CO Q 10 PO) Take 1 capsule by mouth daily.     furosemide (LASIX) 20 MG tablet Take 1 tablet po BID for 5 days then go back to taking 1 tablet po QD (  Patient taking differently: Take 20 mg by mouth daily.) 90 tablet 1   ipratropium-albuterol (DUONEB) 0.5-2.5 (3) MG/3ML SOLN Take 3 mLs by nebulization every 4 (four) hours as needed. 360 mL 1   KLOR-CON M10 10 MEQ tablet TAKE 1 TABLET BY MOUTH EVERY DAY (Patient taking differently: Take 10 mEq by mouth every other day.) 90 tablet 1   levothyroxine (SYNTHROID) 25 MCG tablet Take 0.5 tablets (12.5 mcg total) by mouth daily. In addition to the 50 mcg. 45 tablet 1   levothyroxine (SYNTHROID) 50 MCG tablet Take 1 tablet (50 mcg total) by mouth See admin instructions. Along with 12.5 mcg qd except on Sunday (pt just takes 50  mcg) 90 tablet 1   lidocaine-prilocaine (EMLA) cream Apply 1 Application topically as needed. (Patient taking differently: Apply 1 Application topically as needed (pain).) 30 g 2   metoprolol succinate (TOPROL-XL) 100 MG 24 hr tablet Take 1 tablet (100 mg total) by mouth daily. Take with or immediately following a meal. 90 tablet 1   naproxen sodium (ALEVE) 220 MG tablet Take 220 mg by mouth 2 (two) times daily as needed (pain).     Omega-3 Fatty Acids (OMEGA-3 PO) Take 1,000 mg by mouth in the morning and at bedtime.     revefenacin (YUPELRI) 175 MCG/3ML nebulizer solution Take 3 mLs (175 mcg total) by nebulization daily. 90 mL 1   rosuvastatin (CRESTOR) 40 MG tablet Take 1 tablet (40 mg total) by mouth daily. 90 tablet 3   No current facility-administered medications for this visit.    SURGICAL HISTORY:  Past Surgical History:  Procedure Laterality Date   CARDIAC CATHETERIZATION  07/06/2010   see CABG report - pt sent to OR   CARDIOVASCULAR STRESS TEST  09/11/2010   R/S MV - normal perfusion in all regions, EF 46%, no scintigraphic evidence of inducible myocardial ischemia; global LV systolic function mildly reduced; no significant wall motion abnormalities noted; Exercise capacity 7 METS; EKG negative for ischemia; low risk study, no signifcant change from previous study 08/2003   CORONARY ARTERY BYPASS GRAFT  07/11/2010   LIMA to LAD; SVG to 2nd branch OM; SVG to posterior descending artery   IR IMAGING GUIDED PORT INSERTION  08/16/2021   LEFT VATS  09/17/2010   TEE WITHOUT CARDIOVERSION  07/06/2010   during emergent CABG surgery; 2-3+ mitral regurgitation   VIDEO BRONCHOSCOPY N/A 07/27/2021   Procedure: VIDEO BRONCHOSCOPY;  Surgeon: Melrose Nakayama, MD;  Location: [facility];  Service: Thoracic;  Laterality: N/A;   VIDEO BRONCHOSCOPY WITH ENDOBRONCHIAL ULTRASOUND N/A 07/27/2021   Procedure: VIDEO BRONCHOSCOPY WITH ENDOBRONCHIAL ULTRASOUND;  Surgeon: Melrose Nakayama, MD;  Location: [facility];  Service: Thoracic;  Laterality: N/A;    REVIEW OF SYSTEMS:  Constitutional: positive for fatigue Eyes: negative Ears, nose, mouth, throat, and face: negative Respiratory: positive for cough and dyspnea on exertion Cardiovascular: negative Gastrointestinal: negative Genitourinary:negative Integument/breast: negative Hematologic/lymphatic: negative Musculoskeletal:positive for muscle weakness Neurological: negative Behavioral/Psych: negative Endocrine: negative Allergic/Immunologic: negative   PHYSICAL EXAMINATION: General appearance: alert, cooperative, fatigued, and no distress Head: Normocephalic, without obvious abnormality, atraumatic Neck: no adenopathy, no JVD, supple, symmetrical, trachea midline, and thyroid not enlarged, symmetric, no tenderness/mass/nodules Lymph nodes: Cervical, supraclavicular, and axillary nodes normal. Resp: wheezes bilaterally Back: symmetric, no curvature. ROM normal. No CVA tenderness. Cardio: regular rate and rhythm, S1, S2 normal, no murmur, click, rub or gallop GI: soft, non-tender; bowel sounds normal; no masses,  no organomegaly Extremities: extremities normal, atraumatic, no cyanosis or  edema Neurologic: Alert and oriented X 3, normal strength and tone. Normal symmetric reflexes. Normal coordination and gait  ECOG PERFORMANCE STATUS: 1 - Symptomatic but completely ambulatory  Blood pressure 104/60, pulse 90, temperature 99.1 F (37.3 C), temperature source Oral, resp. rate 17, weight 216 lb 3.2 oz (98.1 kg), SpO2 95 %.  LABORATORY DATA: Lab Results  Component Value Date   WBC 10.5 03/20/2022   HGB 10.9 (L) 03/20/2022   HCT 34.0 (L) 03/20/2022   MCV 88.6 03/20/2022   PLT 280.0 03/20/2022      Chemistry      Component Value Date/Time   NA 144 03/20/2022 1424   NA 143 05/02/2021 1055   K 4.7 03/20/2022 1424   CL 99 03/20/2022 1424   CO2 36 (H) 03/20/2022 1424   BUN 17 03/20/2022 1424   BUN 13 05/02/2021 1055   CREATININE  1.26 (H) 03/20/2022 1424   CREATININE 0.85 02/14/2022 1050   CREATININE 0.81 07/02/2016 1323      Component Value Date/Time   CALCIUM 9.5 03/20/2022 1424   ALKPHOS 82 02/14/2022 1050   AST 89 (H) 02/14/2022 1050   ALT 251 (H) 02/14/2022 1050   BILITOT 0.3 02/14/2022 1050       RADIOGRAPHIC STUDIES: CT Chest W Contrast  Result Date: 03/19/2022 CLINICAL DATA:  Non-small-cell lung cancer. Restaging. * Tracking Code: BO * EXAM: CT CHEST WITH CONTRAST TECHNIQUE: Multidetector CT imaging of the chest was performed during intravenous contrast administration. RADIATION DOSE REDUCTION: This exam was performed according to the departmental dose-optimization program which includes automated exposure control, adjustment of the mA and/or kV according to patient size and/or use of iterative reconstruction technique. CONTRAST:  59mL OMNIPAQUE IOHEXOL 300 MG/ML  SOLN COMPARISON:  Chest CTA 01/28/2022.  Chest CT 01/07/2022. FINDINGS: Cardiovascular: The heart size is normal. No substantial pericardial effusion. Coronary artery calcification is evident. Moderate atherosclerotic calcification is noted in the wall of the thoracic aorta. Status post CABG. Right Port-A-Cath tip is positioned in the distal SVC. Mediastinum/Nodes: No mediastinal lymphadenopathy. There is no hilar lymphadenopathy. The esophagus has normal imaging features. There is no axillary lymphadenopathy. Lungs/Pleura: Centrilobular and paraseptal emphysema evident. No suspicious pulmonary nodule or mass in the right lung. Status post left upper lobectomy. Pleuroparenchymal scarring in the left lung is similar to prior, likely treatment related. Stable chronic pleural fluid/thickening. Upper Abdomen: Tiny hypodensities in the lateral segment left liver and right liver adjacent to the gallbladder fossa are stable, consistent with benign etiology such as cysts. No followup imaging is recommended. Stable appearance of mild gallbladder wall irregularity  without substantial pericholecystic edema or fluid. No calcified gallstones evident. Ill-defined hypoenhancing lesion is seen in the posterior interpolar left kidney measuring 3.6 x 2.8 x 3.4 cm (axial image 155/2). This finding is more prominent than on the most recent comparison study of 01/28/2022 and was not included on the exam from 01/07/2022. Musculoskeletal: No worrisome lytic or sclerotic osseous abnormality. IMPRESSION: 1. Status post left upper lobectomy. Persistent chronic left pleural fluid and/or pleural thickening. The extensive septal thickening in the left lung is similar to prior and may be treatment related although lymphangitic tumor could have this appearance. Generally, imaging features are stable in the interval. 2. Ill-defined hypoenhancing lesion in the posterior interpolar left kidney measuring 3.6 x 2.8 x 3.4 cm. This finding is more prominent than on the most recent comparison study of 01/28/2022 and was not included on the exam from 01/07/2022. Imaging features are concerning for neoplasm. CT  abdomen with and without contrast or MRI of the abdomen with and without contrast recommended to further evaluate. 3. Stable appearance of mild gallbladder wall irregularity without substantial pericholecystic edema or fluid. No calcified gallstones evident. 4. Aortic Atherosclerosis (ICD10-I70.0) and Emphysema (ICD10-J43.9). Electronically Signed   By: [name] M.D.   On: 03/19/2022 11:08   DG Chest 2 View  Result Date: 02/20/2022 CLINICAL DATA:  Pneumonitis.  Non-small cell lung cancer. EXAM: CHEST - 2 VIEW COMPARISON:  02/08/2022 and CT chest 01/28/2022. FINDINGS: Right IJ power port tip is in the SVC. Heart is enlarged, similar. Thoracic aorta is calcified. Increasing airspace opacification in the left perihilar region with possible fluid in the left costophrenic angle. Mild interstitial prominence in the right lung. Elevated left hemidiaphragm. IMPRESSION: 1. Airspace opacification in  the left perihilar region may be due to pneumonia. 2. Small left pleural effusion. 3. Interstitial prominence in the right lung, possibly due to edema. 4. Consider repeat CT chest with contrast in further evaluation, as clinically indicated. 5.  Aortic atherosclerosis (ICD10-I70.0). Electronically Signed   By: Lorin Picket M.D.   On: 02/20/2022 14:42    ASSESSMENT AND PLAN: This is a very pleasant 77 years old white female with Stage IIb/IIIa (T3, N0/N2, M0) non-small cell lung cancer, squamous cell carcinoma presented with left hilar mass with suspicious mediastinal lymphadenopathy on the last CT scan of the chest at [facility]. She also has a history of stage Ia non-small cell lung cancer, adenocarcinoma status post surgical resection in July 2012 as well as recurrent stage Ia non-small cell lung cancer, squamous cell carcinoma status post SBRT completed January 30, 2021. The patient completed a course of concurrent chemoradiation with weekly carboplatin for AUC of 2 and paclitaxel 45 Mg/M2 status post 6 cycles.   She underwent consolidation treatment with immunotherapy with Imfinzi 1500 Mg IV every 4 weeks.  Status post 4 cycle started on 8/31 2023. The patient has been tolerating this treatment well except for the recent admission to the hospital with worsening dyspnea that was suspicious to be secondary to either pulmonary edema versus infection versus drug-induced pneumonitis from amiodarone or Imfinzi. Her treatment with amiodarone was discontinued. The patient was also treated with a tapered dose of prednisone over the last few weeks. She tolerated the taper prednisone fairly well. The patient had repeat CT scan of the chest performed recently.  I personally and independently reviewed the scan images and discussed the result with the patient and her husband. Her scan showed significant improvement in the airspace disease that was seen on the previous one.  She continues to have radiation  fibrosis and scarring. I recommended for the patient to discontinue her current treatment with the consolidation immunotherapy at this point because of the concern about the pneumonitis. I will see her back for follow-up visit in 3 months for evaluation with repeat CT scan of the chest for restaging of her disease. For the COPD, she will continue her routine follow-up visit and evaluation by pulmonary medicine. For the suspicious renal mass, I will refer the patient to urology for evaluation. The patient was advised to call immediately if she has any concerning symptoms in the interval.  The patient voices understanding of current disease status and treatment options and is in agreement with the current care plan.  All questions were answered. The patient knows to call the clinic with any problems, questions or concerns. We can certainly see the patient much sooner if necessary.  The  total time spent in the appointment was 30 minutes.  Disclaimer: This note was dictated with voice recognition software. Similar sounding words can inadvertently be transcribed and may not be corrected upon review.

## 2022-03-22 ENCOUNTER — Encounter: Payer: Self-pay | Admitting: Nurse Practitioner

## 2022-03-26 NOTE — Progress Notes (Signed)
Kidney function has declined some. Her hgb has also decreased to 10.9 from 11.3. She usually has repeat labs drawn at the cancer center. If she doesn't in the next few weeks, please have her come in for redraw. Thanks.

## 2022-03-27 NOTE — Progress Notes (Signed)
Called and spoke with patient, advised her of results/recommendations per Joellen Jersey.  She verbalized understanding.  She stated she is not to f/u at the cancer center in the next few weeks so she will schedule an office visit with her pcp, who is a [facility] physician and have her labs rechecked in a few weeks.  She wanted to know if Joellen Jersey ordered an ONO?  She states that [facility] called her and told her that Joellen Jersey had ordered it.  I reviewed Katie's last note and did not see any mention of an ONO.  Katie, please advise if patient is to have an ONO?  Thank you.

## 2022-03-28 DIAGNOSIS — J9601 Acute respiratory failure with hypoxia: Secondary | ICD-10-CM | POA: Diagnosis not present

## 2022-03-28 NOTE — Progress Notes (Signed)
Called and spoke with patient and advised her that Joellen Jersey did not order an ONO on her and we would contact [employer] to make them aware.  She verbalized understanding.

## 2022-03-28 NOTE — Progress Notes (Signed)
I don't recall needing one on her and no mention in my last note. Ok to d/c. Thanks.

## 2022-03-29 NOTE — Progress Notes (Signed)
Community message sent to [name] to make her aware that an ONO was not ordered on this patient and the equipment needs to be picked up.  No order in epic for ONO.  Nothing further needed.

## 2022-04-03 DIAGNOSIS — H26493 Other secondary cataract, bilateral: Secondary | ICD-10-CM | POA: Diagnosis not present

## 2022-04-03 DIAGNOSIS — H04123 Dry eye syndrome of bilateral lacrimal glands: Secondary | ICD-10-CM | POA: Diagnosis not present

## 2022-04-03 DIAGNOSIS — H524 Presbyopia: Secondary | ICD-10-CM | POA: Diagnosis not present

## 2022-04-03 DIAGNOSIS — H52203 Unspecified astigmatism, bilateral: Secondary | ICD-10-CM | POA: Diagnosis not present

## 2022-04-08 ENCOUNTER — Ambulatory Visit: Payer: Medicare HMO | Admitting: Internal Medicine

## 2022-04-09 ENCOUNTER — Ambulatory Visit (HOSPITAL_COMMUNITY)
Admission: RE | Admit: 2022-04-09 | Discharge: 2022-04-09 | Disposition: A | Discharge status: Home / Self Care | Payer: Medicare HMO | Source: Ambulatory Visit | Attending: Physician Assistant | Admitting: Physician Assistant

## 2022-04-09 ENCOUNTER — Encounter (HOSPITAL_COMMUNITY): Payer: Self-pay | Admitting: Physician Assistant

## 2022-04-09 VITALS — BP 88/50 | HR 86 | Ht 63.0 in | Wt 216.8 lb

## 2022-04-09 DIAGNOSIS — Z85118 Personal history of other malignant neoplasm of bronchus and lung: Secondary | ICD-10-CM | POA: Diagnosis not present

## 2022-04-09 DIAGNOSIS — Z87891 Personal history of nicotine dependence: Secondary | ICD-10-CM | POA: Diagnosis not present

## 2022-04-09 DIAGNOSIS — J9610 Chronic respiratory failure, unspecified whether with hypoxia or hypercapnia: Secondary | ICD-10-CM | POA: Diagnosis not present

## 2022-04-09 DIAGNOSIS — I480 Paroxysmal atrial fibrillation: Secondary | ICD-10-CM | POA: Insufficient documentation

## 2022-04-09 DIAGNOSIS — Z79899 Other long term (current) drug therapy: Secondary | ICD-10-CM | POA: Insufficient documentation

## 2022-04-09 DIAGNOSIS — I4892 Unspecified atrial flutter: Secondary | ICD-10-CM | POA: Diagnosis not present

## 2022-04-09 DIAGNOSIS — I2510 Atherosclerotic heart disease of native coronary artery without angina pectoris: Secondary | ICD-10-CM | POA: Diagnosis not present

## 2022-04-09 DIAGNOSIS — Z951 Presence of aortocoronary bypass graft: Secondary | ICD-10-CM | POA: Diagnosis not present

## 2022-04-09 DIAGNOSIS — Z8249 Family history of ischemic heart disease and other diseases of the circulatory system: Secondary | ICD-10-CM | POA: Insufficient documentation

## 2022-04-09 DIAGNOSIS — D6869 Other thrombophilia: Secondary | ICD-10-CM | POA: Diagnosis not present

## 2022-04-09 DIAGNOSIS — J449 Chronic obstructive pulmonary disease, unspecified: Secondary | ICD-10-CM | POA: Insufficient documentation

## 2022-04-09 DIAGNOSIS — Z9981 Dependence on supplemental oxygen: Secondary | ICD-10-CM | POA: Insufficient documentation

## 2022-04-09 DIAGNOSIS — I10 Essential (primary) hypertension: Secondary | ICD-10-CM | POA: Diagnosis not present

## 2022-04-09 MED ORDER — METOPROLOL SUCCINATE ER 100 MG PO TB24: 50.0000 mg | ORAL_TABLET | Freq: Every day | ORAL | 1 refills | Status: DC

## 2022-04-09 NOTE — Progress Notes (Signed)
Primary Care Physician: [name], NP Primary Cardiologist: Dr Claiborne Billings Primary Electrophysiologist: none Referring Physician: Dr Loistine Simas is a 77 y.o. female with a history of CAD status post CABG in 2012, ischemic cardiomyopathy (EF 35% in 2012, improved to 53 to 55% 10/2020) NSCLC, COPD, tobacco use, chronic respiratory failure on 3L, cerebral amyloid angiopathy, atrial fibrillation who presents for follow up in the [facility]. During admission 07/2021 she was found to have paroxysmal atrial flutter, started on amiodarone at that time. Anticoagulation was deferred given concern for amyloid angiopathy on brain MRI with evidence of chronic peripheral microhemorrhage. She was admitted 01/28/2022 with worsening hypoxia, noted to have SpO2 down to 70s on 5 L at PCP office. CTPA showed no PE but showed new groundglass opacities and interstitial opacities. She was started on antibiotics, steroids, and Lasix. PCCM was consulted, thought to represent either immunotherapy or amiodarone related pneumonitis. She was started on high-dose Solu-Medrol and Rocephin/azithromycin. Amiodarone was discontinued. Patient has a CHADS2VASC score of 4. Patient referred to the AF clinic to discuss rhythm control options.   On follow up today, patient reports that she has done reasonably well since her last visit. She has not had any tachypalpitations. She did have a CT scan which showed interval improvement in her airspace disease but it did show a mass on her kidney, referred to urology. She has noted her systolic BP has been under 100 most days.   Today, she denies symptoms of palpitations, chest pain, shortness of breath, orthopnea, PND, lower extremity edema, dizziness, presyncope, syncope, snoring, daytime somnolence, bleeding, or neurologic sequela. The patient is tolerating medications without difficulties and is otherwise without complaint today.    Atrial  Fibrillation Risk Factors:  she does not have symptoms or diagnosis of sleep apnea. she does not have a history of rheumatic fever.   she has a BMI of Body mass index is 38.4 kg/m.Marland Kitchen Filed Weights   04/09/22 1439  Weight: 98.3 kg    Family History  Problem Relation Age of Onset   Hypothyroidism Father    Heart attack Father    Aplastic anemia Maternal Grandfather    Stroke Paternal Grandfather      Atrial Fibrillation Management history:  Previous antiarrhythmic drugs: amiodarone Previous cardioversions: none Previous ablations: none CHADS2VASC score: 4 Anticoagulation history: none   Past Medical History:  Diagnosis Date   CAD (coronary artery disease)    Cancer ([place])    COPD (chronic obstructive pulmonary disease) ([place])    per DM note, pt denies   Dyslipidemia    GERD (gastroesophageal reflux disease)    History of radiation therapy    Left lung- 01/16/21-01/30/21- Dr. Gery Pray   History of radiation therapy    Left lung- 08/02/21-09/18/21- Dr. Gery Pray   HTN (hypertension)    Hypothyroidism    Myocardial infarct [facility]    Non-small cell lung cancer ([place]) 08/19/2010   Stage IA, status post left upper lobectomy July 2012   Tobacco abuse    Past Surgical History:  Procedure Laterality Date   CARDIAC CATHETERIZATION  07/06/2010   see CABG report - pt sent to OR   CARDIOVASCULAR STRESS TEST  09/11/2010   R/S MV - normal perfusion in all regions, EF 46%, no scintigraphic evidence of inducible myocardial ischemia; global LV systolic function mildly reduced; no significant wall motion abnormalities noted; Exercise capacity 7 METS; EKG negative for ischemia; low risk study, no signifcant  change from previous study 08/2003   CORONARY ARTERY BYPASS GRAFT  07/11/2010   LIMA to LAD; SVG to 2nd branch OM; SVG to posterior descending artery   IR IMAGING GUIDED PORT INSERTION  08/16/2021   LEFT VATS  09/17/2010   TEE WITHOUT CARDIOVERSION  07/06/2010   during emergent  CABG surgery; 2-3+ mitral regurgitation   VIDEO BRONCHOSCOPY N/A 07/27/2021   Procedure: VIDEO BRONCHOSCOPY;  Surgeon: Melrose Nakayama, MD;  Location: [facility];  Service: Thoracic;  Laterality: N/A;   VIDEO BRONCHOSCOPY WITH ENDOBRONCHIAL ULTRASOUND N/A 07/27/2021   Procedure: VIDEO BRONCHOSCOPY WITH ENDOBRONCHIAL ULTRASOUND;  Surgeon: Melrose Nakayama, MD;  Location: [facility] OR;  Service: Thoracic;  Laterality: N/A;    Current Outpatient Medications  Medication Sig Dispense Refill   acetaminophen (TYLENOL) 500 MG tablet Take 500 mg by mouth every 6 (six) hours as needed for moderate pain or fever.     albuterol (PROVENTIL) (2.5 MG/3ML) 0.083% nebulizer solution Take 3 mLs (2.5 mg total) by nebulization 3 (three) times daily as needed for wheezing or shortness of breath. 75 mL 3   arformoterol (BROVANA) 15 MCG/2ML NEBU Take 2 mLs (15 mcg total) by nebulization 2 (two) times daily. 120 mL 11   aspirin 81 MG tablet Take 81 mg by mouth at bedtime.     budesonide (PULMICORT) 0.5 MG/2ML nebulizer solution Take 2 mLs (0.5 mg total) by nebulization 2 (two) times daily. 36 mL 6   Coenzyme Q10 (CO Q 10 PO) Take 1 capsule by mouth daily.     furosemide (LASIX) 20 MG tablet Take 1 tablet po BID for 5 days then go back to taking 1 tablet po QD (Patient taking differently: Take 20 mg by mouth daily.) 90 tablet 1   ipratropium-albuterol (DUONEB) 0.5-2.5 (3) MG/3ML SOLN Take 3 mLs by nebulization every 4 (four) hours as needed. 360 mL 1   KLOR-CON M10 10 MEQ tablet TAKE 1 TABLET BY MOUTH EVERY DAY (Patient taking differently: Take 10 mEq by mouth every other day.) 90 tablet 1   levothyroxine (SYNTHROID) 25 MCG tablet Take 0.5 tablets (12.5 mcg total) by mouth daily. In addition to the 50 mcg. 45 tablet 1   levothyroxine (SYNTHROID) 50 MCG tablet Take 1 tablet (50 mcg total) by mouth See admin instructions. Along with 12.5 mcg qd except on Sunday (pt just takes 50 mcg) 90 tablet 1   lidocaine-prilocaine (EMLA)  cream Apply 1 Application topically as needed. (Patient taking differently: Apply 1 Application topically as needed (pain).) 30 g 2   metoprolol succinate (TOPROL-XL) 100 MG 24 hr tablet Take 1 tablet (100 mg total) by mouth daily. Take with or immediately following a meal. 90 tablet 1   naproxen sodium (ALEVE) 220 MG tablet Take 220 mg by mouth 2 (two) times daily as needed (pain).     Omega-3 Fatty Acids (OMEGA-3 PO) Take 1,000 mg by mouth in the morning and at bedtime.     revefenacin (YUPELRI) 175 MCG/3ML nebulizer solution Take 3 mLs (175 mcg total) by nebulization daily. 90 mL 1   rosuvastatin (CRESTOR) 40 MG tablet Take 1 tablet (40 mg total) by mouth daily. 90 tablet 3   No current facility-administered medications for this encounter.    Allergies  Allergen Reactions   Codeine Other (See Comments)    Unknown reaction Patient avoids this medication    Social History   Socioeconomic History   Marital status: Married    Spouse name: Not on file   Number  of children: Not on file   Years of education: Not on file   Highest education level: Not on file  Occupational History   Not on file  Tobacco Use   Smoking status: Former    Packs/day: 1.00    Years: 58.00    Total pack years: 58.00    Types: Cigarettes    Quit date: 07/19/2021    Years since quitting: 0.7   Smokeless tobacco: Former   Tobacco comments:    Former smoker 02/19/22  Vaping Use   Vaping Use: Never used  Substance and Sexual Activity   Alcohol use: No    Alcohol/week: 0.0 standard drinks of alcohol   Drug use: No   Sexual activity: Never  Other Topics Concern   Not on file  Social History Narrative   Not on file   Social Determinants of Health   Financial Resource Strain: Low Risk  (02/12/2022)   Overall Financial Resource Strain (CARDIA)    Difficulty of Paying Living Expenses: Not very hard  Food Insecurity: No Food Insecurity (01/28/2022)   Hunger Vital Sign    Worried About Running Out of  Food in the Last Year: Never true    [place] in the Last Year: Never true  Transportation Needs: No Transportation Needs (02/12/2022)   PRAPARE - Hydrologist (Medical): No    Lack of Transportation (Non-Medical): No  Physical Activity: Inactive (01/28/2022)   Exercise Vital Sign    Days of Exercise per Week: 0 days    Minutes of Exercise per Session: 0 min  Stress: No Stress Concern Present (01/28/2022)   [place]    Feeling of Stress : Not at all  Social Connections: Socially Isolated (01/28/2022)   Social Connection and Isolation Panel [NHANES]    Frequency of Communication with Friends and Family: Once a week    Frequency of Social Gatherings with Friends and Family: Once a week    Attends Religious Services: Never    Marine scientist or Organizations: No    Attends Archivist Meetings: Never    Marital Status: Married  Human resources officer Violence: Not At Risk (01/28/2022)   Humiliation, Afraid, Rape, and Kick questionnaire    Fear of Current or Ex-Partner: No    Emotionally Abused: No    Physically Abused: No    Sexually Abused: No     ROS- All systems are reviewed and negative except as per the HPI above.  Physical Exam: Vitals:   04/09/22 1439  BP: (!) 88/50  Pulse: 86  Weight: 98.3 kg  Height: 5\' 3"  (1.6 m)     GEN- The patient is a well appearing elderly female, alert and oriented x 3 today.   HEENT-head normocephalic, atraumatic, sclera clear, conjunctiva pink, hearing intact, trachea midline. Lungs- Clear to ausculation bilaterally, normal work of breathing, on O2 nasal canula  Heart- Regular rate and rhythm, no murmurs, rubs or gallops  GI- soft, NT, ND, + BS Extremities- no clubbing, cyanosis, or edema MS- no significant deformity or atrophy Skin- no rash or lesion Psych- euthymic mood, full affect Neuro- strength and sensation are  intact   Wt Readings from Last 3 Encounters:  04/09/22 98.3 kg  03/21/22 98.1 kg  03/20/22 98 kg    EKG today demonstrates  SR, NST Vent. rate 86 BPM PR interval 162 ms QRS duration 88 ms QT/QTcB 398/476 ms  Echo 01/21/22  demonstrated   1. Difficult study due to poor visualization of LV endocardium.   2. Left ventricular ejection fraction, by estimation, is 55 to 60%. The  left ventricle has normal function. Left ventricular endocardial border  not optimally defined to evaluate regional wall motion. Left ventricular  diastolic parameters are consistent with Grade I diastolic dysfunction (impaired relaxation). The average left ventricular global longitudinal strain is -18.1 %. The global longitudinal strain is normal.   3. Right ventricular systolic function is mildly reduced. The right  ventricular size is normal. Tricuspid regurgitation signal is inadequate  for assessing PA pressure.   4. The mitral valve is grossly normal. Trivial mitral valve  regurgitation.   5. The aortic valve was not well visualized. Aortic valve regurgitation  is not visualized. No aortic stenosis is present.   6. The inferior vena cava is normal in size with greater than 50%  respiratory variability, suggesting right atrial pressure of 3 mmHg.   Comparison(s): Compared to prior TTE in 10/2020, the EF appears slightly improved from 50-55% to 55%. Otherwise, there is no significant change.   Epic records are reviewed at length today  CHA2DS2-VASc Score = 4  The patient's score is based upon: CHF History: 0 (EF normalized) HTN History: 0 Diabetes History: 0 Stroke History: 0 Vascular Disease History: 1 Age Score: 2 Gender Score: 1       ASSESSMENT AND PLAN: 1. Paroxysmal Atrial Fibrillation/atrial flutter The patient's CHA2DS2-VASc score is 4, indicating a 4.8% annual risk of stroke.   Patient in [place] today.  Amiodarone discontinued due to concern about possible lung toxicity. Multaq should  also not be used if amio lung toxicity suspected. Would avoid class IC with h/o CAD. Not an ablation candidate with other comorbidities. Dofetilide is likely the only rhythm control option.  Patient would like to defer dofetilide admission for now.  Will check amiodarone level today in case she decides to proceed with admission.  Not felt to be a candidate for anticoagulation with h/o cerebral amyloid angiopathy on MRI with evidence of microhemorrhage.  Decrease Toprol to 50 mg daily given hypotension.   2. Secondary Hypercoagulable State (ICD10:  D68.69) The patient is at significant risk for stroke/thromboembolism based upon her CHA2DS2-VASc Score of 4.  However, the patient is not on anticoagulation due to her high bleeding risk. Unlikely Watchman candidate with other comorbidities.   3. CAD S/p CABG 2012 No anginal symptoms.  4. Non small cell lung cancer Plans per pulmonology and oncology   Follow up with Dr Claiborne Billings as scheduled. AF clinic in 4 months to revisit rhythm control.    [facility] [address] [place], [place] [address] [phone] 04/09/2022 2:56 PM

## 2022-04-09 NOTE — Patient Instructions (Signed)
Decrease metoprolol to 50mg  daily (1/2 of your 100mg  tablet)

## 2022-04-14 ENCOUNTER — Encounter: Payer: Self-pay | Admitting: Nurse Practitioner

## 2022-04-16 LAB — AMIODARONE LEVEL
Amiodarone Lvl: 199 ng/mL — ABNORMAL LOW (ref 1000–2500)
N-Desethyl-Amiodarone: 179 ng/mL

## 2022-04-17 NOTE — Progress Notes (Signed)
Established patient visit   Patient: [name]   DOB: [birth year]   77 y.o. Female  MRN: [id number] Visit Date: 04/18/2022   Chief Complaint  Patient presents with   Medical Management of Chronic Issues   Subjective    HPI  Follow up -patient has asked to Hgb -due to have routine, fasting labs  -wants check BP  -recently hospitalized for two weeks or so due to respiratory failure.  --states that she was asked several times about living will. Was asked about her living will several times while in the hospital.  -imaging done during hospitalization did who a new mass on kidneys. Believed to be metastasis. Currently, no subsequent imaging is ordered.    Medications: Outpatient Medications Prior to Visit  Medication Sig   acetaminophen (TYLENOL) 500 MG tablet Take 500 mg by mouth every 6 (six) hours as needed for moderate pain or fever.   arformoterol (BROVANA) 15 MCG/2ML NEBU Take 2 mLs (15 mcg total) by nebulization 2 (two) times daily.   aspirin 81 MG tablet Take 81 mg by mouth at bedtime.   budesonide (PULMICORT) 0.5 MG/2ML nebulizer solution Take 2 mLs (0.5 mg total) by nebulization 2 (two) times daily.   Coenzyme Q10 (CO Q 10 PO) Take 1 capsule by mouth daily.   KLOR-CON M10 10 MEQ tablet TAKE 1 TABLET BY MOUTH EVERY DAY (Patient taking differently: Take 10 mEq by mouth every other day.)   levothyroxine (SYNTHROID) 25 MCG tablet Take 0.5 tablets (12.5 mcg total) by mouth daily. In addition to the 50 mcg.   lidocaine-prilocaine (EMLA) cream Apply 1 Application topically as needed. (Patient taking differently: Apply 1 Application topically as needed (pain).)   metoprolol succinate (TOPROL-XL) 100 MG 24 hr tablet Take 0.5 tablets (50 mg total) by mouth daily. Take with or immediately following a meal.   naproxen sodium (ALEVE) 220 MG tablet Take 220 mg by mouth 2 (two) times daily as needed (pain).   Omega-3 Fatty Acids (OMEGA-3 PO) Take 1,000 mg by mouth in the morning and at  bedtime.   revefenacin (YUPELRI) 175 MCG/3ML nebulizer solution Take 3 mLs (175 mcg total) by nebulization daily.   rosuvastatin (CRESTOR) 40 MG tablet Take 1 tablet (40 mg total) by mouth daily.   [DISCONTINUED] furosemide (LASIX) 20 MG tablet Take 1 tablet po BID for 5 days then go back to taking 1 tablet po QD (Patient taking differently: Take 20 mg by mouth daily.)   [DISCONTINUED] levothyroxine (SYNTHROID) 50 MCG tablet Take 1 tablet (50 mcg total) by mouth See admin instructions. Along with 12.5 mcg qd except on Sunday (pt just takes 50 mcg)   [DISCONTINUED] albuterol (PROVENTIL) (2.5 MG/3ML) 0.083% nebulizer solution Take 3 mLs (2.5 mg total) by nebulization 3 (three) times daily as needed for wheezing or shortness of breath.   [DISCONTINUED] ipratropium-albuterol (DUONEB) 0.5-2.5 (3) MG/3ML SOLN Take 3 mLs by nebulization every 4 (four) hours as needed.   No facility-administered medications prior to visit.    Review of Systems See HPI    Last CBC Lab Results  Component Value Date   WBC 13.5 (H) 05/06/2022   HGB 10.6 (L) 05/06/2022   HCT 34.7 (L) 05/06/2022   MCV 88.0 05/06/2022   MCH 27.1 04/19/2022   RDW 20.4 (H) 05/06/2022   PLT 362.0 [id number]   Last metabolic panel Lab Results  Component Value Date   GLUCOSE 86 04/19/2022   NA 145 (H) 04/19/2022   K 4.0 04/19/2022   CL  99 04/19/2022   CO2 28 04/19/2022   BUN 9 04/19/2022   CREATININE 1.00 04/19/2022   EGFR 59 (L) 04/19/2022   CALCIUM 9.3 04/19/2022   PHOS 4.1 02/02/2022   PROT 6.0 04/19/2022   ALBUMIN 3.5 (L) 04/19/2022   LABGLOB 2.5 04/19/2022   AGRATIO 1.4 04/19/2022   BILITOT 0.3 04/19/2022   ALKPHOS 73 04/19/2022   AST 31 04/19/2022   ALT 24 04/19/2022   ANIONGAP 4 (L) 02/14/2022   Last lipids Lab Results  Component Value Date   CHOL 99 (L) 04/19/2022   HDL 43 04/19/2022   LDLCALC 37 04/19/2022   TRIG 100 04/19/2022   CHOLHDL 2.3 04/19/2022   Last hemoglobin A1c Lab Results  Component Value  Date   HGBA1C 5.8 (H) 04/19/2022   Last thyroid functions Lab Results  Component Value Date   TSH 3.340 04/19/2022   T3TOTAL 131 08/18/2018   T4TOTAL 9.6 10/18/2021   Last vitamin D Lab Results  Component Value Date   VD25OH 42.2 10/25/2020       Objective     Today's Vitals   04/18/22 1358  BP: 109/61  Pulse: 87  SpO2: 90%  Weight: 213 lb 12.8 oz (97 kg)  Height: 5\' 3"  (1.6 m)   Body mass index is 37.87 kg/m.  BP Readings from Last 3 Encounters:  05/06/22 116/64  04/18/22 109/61  04/09/22 (Abnormal) 88/50    Wt Readings from Last 3 Encounters:  05/06/22 215 lb 12.8 oz (97.9 kg)  04/18/22 213 lb 12.8 oz (97 kg)  04/09/22 216 lb 12.8 oz (98.3 kg)    Physical Exam Vitals and nursing note reviewed.  Constitutional:      Appearance: Normal appearance. She is well-developed. She is obese. She is not ill-appearing.  HENT:     Head: Normocephalic and atraumatic.     Nose: Nose normal.     Mouth/Throat:     Mouth: Mucous membranes are moist.     Pharynx: Oropharynx is clear.  Eyes:     Extraocular Movements: Extraocular movements intact.     Conjunctiva/sclera: Conjunctivae normal.     Pupils: Pupils are equal, round, and reactive to light.  Neck:     Comments: JVD present on left side of neck  Cardiovascular:     Rate and Rhythm: Normal rate and regular rhythm.     Pulses: Normal pulses.     Heart sounds: Normal heart sounds.  Pulmonary:     Effort: Pulmonary effort is normal.     Breath sounds: Wheezing and rhonchi present.     Comments: Oxygen saturation improved since her most recent visit. They are now 90%. She is currently using 3-4 lpm of nasal cannula oxygen . Abdominal:     Palpations: Abdomen is soft.  Musculoskeletal:        General: Normal range of motion.     Cervical back: Normal range of motion and neck supple.  Lymphadenopathy:     Cervical: No cervical adenopathy.  Skin:    General: Skin is warm and dry.     Capillary Refill:  Capillary refill takes less than 2 seconds.  Neurological:     General: No focal deficit present.     Mental Status: She is alert and oriented to person, place, and time.  Psychiatric:        Mood and Affect: Mood normal.        Behavior: Behavior normal.        Thought Content: Thought content  normal.        Judgment: Judgment normal.       Assessment & Plan    Acute on chronic respiratory failure with hypoxia and hypercapnia (HCC) Assessment & Plan: Recent hospitalization for acute on chronic respiratory failure. Improved. Will continue to see pulmonology for maintenance.    Malignant neoplasm of upper lobe of left lung [facility]) Assessment & Plan: Continues regular visits with oncology and CT surgery for surveillance.    Supplemental oxygen dependent Assessment & Plan: Continue nasal cannula oxygen use as prescribed per pulmonology    Chronic congestive heart failure, unspecified heart failure type [facility]) Assessment & Plan: Continue regular visits with cardiology as scheduled and all medications as prescribed    Lower extremity edema Assessment & Plan: May take low dose furosemide as needed and as prescribed   Orders: -     Furosemide; Take 1 tablet (20 mg total) by mouth daily.  Dispense: 90 tablet; Refill: 1  Other specified disorders of kidney and ureter Assessment & Plan: Ct abdomen ordered for further evaluation of mass found on kidneys during recent hospitalization   Orders: -     [place]; Future     Return in about 4 weeks (around 05/16/2022) for shortness of breath . review CT and labs - needs FBW tomorrow or next few days .         [name], NP  [facility] at [facility] [phone] (phone) [phone] (fax)  [place]

## 2022-04-18 ENCOUNTER — Other Ambulatory Visit: Payer: Medicare HMO

## 2022-04-18 ENCOUNTER — Ambulatory Visit: Payer: Medicare HMO

## 2022-04-18 ENCOUNTER — Encounter: Payer: Self-pay | Admitting: Nurse Practitioner

## 2022-04-18 ENCOUNTER — Ambulatory Visit: Payer: Medicare HMO | Admitting: Physician Assistant

## 2022-04-18 ENCOUNTER — Ambulatory Visit (INDEPENDENT_AMBULATORY_CARE_PROVIDER_SITE_OTHER): Payer: Medicare HMO | Admitting: Nurse Practitioner

## 2022-04-18 VITALS — BP 109/61 | HR 87 | Ht 63.0 in | Wt 213.8 lb

## 2022-04-18 DIAGNOSIS — R600 Localized edema: Secondary | ICD-10-CM | POA: Diagnosis not present

## 2022-04-18 DIAGNOSIS — J9621 Acute and chronic respiratory failure with hypoxia: Secondary | ICD-10-CM | POA: Diagnosis not present

## 2022-04-18 DIAGNOSIS — Z9981 Dependence on supplemental oxygen: Secondary | ICD-10-CM

## 2022-04-18 DIAGNOSIS — N2889 Other specified disorders of kidney and ureter: Secondary | ICD-10-CM | POA: Diagnosis not present

## 2022-04-18 DIAGNOSIS — J9622 Acute and chronic respiratory failure with hypercapnia: Secondary | ICD-10-CM | POA: Diagnosis not present

## 2022-04-18 DIAGNOSIS — I509 Heart failure, unspecified: Secondary | ICD-10-CM

## 2022-04-18 DIAGNOSIS — C3412 Malignant neoplasm of upper lobe, left bronchus or lung: Secondary | ICD-10-CM

## 2022-04-18 MED ORDER — FUROSEMIDE 20 MG PO TABS: 20.0000 mg | ORAL_TABLET | Freq: Every day | ORAL | 1 refills | Status: DC

## 2022-04-19 ENCOUNTER — Other Ambulatory Visit: Payer: Self-pay

## 2022-04-19 ENCOUNTER — Other Ambulatory Visit: Payer: Medicare HMO

## 2022-04-19 DIAGNOSIS — D509 Iron deficiency anemia, unspecified: Secondary | ICD-10-CM | POA: Diagnosis not present

## 2022-04-19 DIAGNOSIS — E039 Hypothyroidism, unspecified: Secondary | ICD-10-CM

## 2022-04-19 DIAGNOSIS — R5383 Other fatigue: Secondary | ICD-10-CM | POA: Diagnosis not present

## 2022-04-19 DIAGNOSIS — Z0000 Encounter for general adult medical examination without abnormal findings: Secondary | ICD-10-CM | POA: Diagnosis not present

## 2022-04-20 LAB — FERRITIN: Ferritin: 181 ng/mL — ABNORMAL HIGH (ref 15–150)

## 2022-04-20 LAB — COMPREHENSIVE METABOLIC PANEL
ALT: 24 IU/L (ref 0–32)
AST: 31 IU/L (ref 0–40)
Albumin/Globulin Ratio: 1.4 (ref 1.2–2.2)
Albumin: 3.5 g/dL — ABNORMAL LOW (ref 3.8–4.8)
Alkaline Phosphatase: 73 IU/L (ref 44–121)
BUN/Creatinine Ratio: 9 — ABNORMAL LOW (ref 12–28)
BUN: 9 mg/dL (ref 8–27)
Bilirubin Total: 0.3 mg/dL (ref 0.0–1.2)
CO2: 28 mmol/L (ref 20–29)
Calcium: 9.3 mg/dL (ref 8.7–10.3)
Chloride: 99 mmol/L (ref 96–106)
Creatinine, Ser: 1 mg/dL (ref 0.57–1.00)
Globulin, Total: 2.5 g/dL (ref 1.5–4.5)
Glucose: 86 mg/dL (ref 70–99)
Potassium: 4 mmol/L (ref 3.5–5.2)
Sodium: 145 mmol/L — ABNORMAL HIGH (ref 134–144)
Total Protein: 6 g/dL (ref 6.0–8.5)
eGFR: 59 mL/min/{1.73_m2} — ABNORMAL LOW (ref >59)

## 2022-04-20 LAB — TSH+FREE T4
Free T4: 1.46 ng/dL (ref 0.82–1.77)
TSH: 3.34 u[IU]/mL (ref 0.450–4.500)

## 2022-04-20 LAB — CBC
Hematocrit: 31.2 % — ABNORMAL LOW (ref 34.0–46.6)
Hemoglobin: 9.5 g/dL — ABNORMAL LOW (ref 11.1–15.9)
MCH: 27.1 pg (ref 26.6–33.0)
MCHC: 30.4 g/dL — ABNORMAL LOW (ref 31.5–35.7)
MCV: 89 fL (ref 79–97)
Platelets: 358 10*3/uL (ref 150–450)
RBC: 3.51 x10E6/uL — ABNORMAL LOW (ref 3.77–5.28)
RDW: 17.3 % — ABNORMAL HIGH (ref 11.7–15.4)
WBC: 10.2 10*3/uL (ref 3.4–10.8)

## 2022-04-20 LAB — LIPID PANEL
Chol/HDL Ratio: 2.3 ratio (ref 0.0–4.4)
Cholesterol, Total: 99 mg/dL — ABNORMAL LOW (ref 100–199)
HDL: 43 mg/dL (ref >39)
LDL Chol Calc (NIH): 37 mg/dL (ref 0–99)
Triglycerides: 100 mg/dL (ref 0–149)
VLDL Cholesterol Cal: 19 mg/dL (ref 5–40)

## 2022-04-20 LAB — HEMOGLOBIN A1C
Est. average glucose Bld gHb Est-mCnc: 120 mg/dL
Hgb A1c MFr Bld: 5.8 % — ABNORMAL HIGH (ref 4.8–5.6)

## 2022-04-23 ENCOUNTER — Encounter: Payer: Self-pay | Admitting: Nurse Practitioner

## 2022-04-25 ENCOUNTER — Ambulatory Visit
Admission: RE | Admit: 2022-04-25 | Discharge: 2022-04-25 | Disposition: A | Discharge status: Home / Self Care | Payer: Medicare HMO | Source: Ambulatory Visit | Attending: Nurse Practitioner | Admitting: Nurse Practitioner

## 2022-04-25 DIAGNOSIS — N2889 Other specified disorders of kidney and ureter: Secondary | ICD-10-CM

## 2022-04-25 DIAGNOSIS — R1900 Intra-abdominal and pelvic swelling, mass and lump, unspecified site: Secondary | ICD-10-CM | POA: Diagnosis not present

## 2022-04-25 DIAGNOSIS — N281 Cyst of kidney, acquired: Secondary | ICD-10-CM | POA: Diagnosis not present

## 2022-04-25 DIAGNOSIS — R590 Localized enlarged lymph nodes: Secondary | ICD-10-CM | POA: Diagnosis not present

## 2022-04-25 MED ORDER — IOPAMIDOL (ISOVUE-300) INJECTION 61%
100.0000 mL | Freq: Once | INTRAVENOUS | Status: AC | PRN
  Administered 2022-04-25: 100 mL via INTRAVENOUS

## 2022-04-26 ENCOUNTER — Ambulatory Visit (HOSPITAL_COMMUNITY): Payer: Medicare HMO

## 2022-04-26 ENCOUNTER — Telehealth: Payer: Self-pay | Admitting: *Deleted

## 2022-04-26 ENCOUNTER — Encounter (HOSPITAL_COMMUNITY): Payer: Self-pay

## 2022-04-26 DIAGNOSIS — J9601 Acute respiratory failure with hypoxia: Secondary | ICD-10-CM | POA: Diagnosis not present

## 2022-04-26 NOTE — Telephone Encounter (Signed)
Tech from [facility] calling to report findings on Imaging that was performed on 04/25/22.  He stated that he wanted to be sure that provider saw Impression 1 and 2. Report is in chart will route to PCP and other provider here in office.

## 2022-05-01 ENCOUNTER — Other Ambulatory Visit: Payer: Self-pay | Admitting: Nurse Practitioner

## 2022-05-01 NOTE — Telephone Encounter (Signed)
Routing to be sure you saw this.     Velva Harman, PA  You5 days ago    Reviewed impression, I will leave this open for Nira Conn to see when she gets back as this is her patient and she will likely want to follow-up with them.

## 2022-05-06 ENCOUNTER — Ambulatory Visit: Payer: Medicare HMO | Admitting: Internal Medicine

## 2022-05-06 ENCOUNTER — Encounter: Payer: Self-pay | Admitting: Internal Medicine

## 2022-05-06 VITALS — BP 116/64 | HR 90 | Temp 98.6°F | Ht 63.0 in | Wt 215.8 lb

## 2022-05-06 DIAGNOSIS — J9612 Chronic respiratory failure with hypercapnia: Secondary | ICD-10-CM | POA: Diagnosis not present

## 2022-05-06 DIAGNOSIS — J9611 Chronic respiratory failure with hypoxia: Secondary | ICD-10-CM | POA: Diagnosis not present

## 2022-05-06 DIAGNOSIS — J984 Other disorders of lung: Secondary | ICD-10-CM

## 2022-05-06 DIAGNOSIS — T50905A Adverse effect of unspecified drugs, medicaments and biological substances, initial encounter: Secondary | ICD-10-CM

## 2022-05-06 DIAGNOSIS — J449 Chronic obstructive pulmonary disease, unspecified: Secondary | ICD-10-CM | POA: Diagnosis not present

## 2022-05-06 NOTE — Patient Instructions (Signed)
Please remember to go to the lab department   for your tests - we will call you with the results when they are available.  Make sure you check your oxygen saturation  AT  your highest level of activity (not after you stop)   to be sure it stays over 90% and adjust  02 flow upward to maintain this level if needed but remember to turn it back to previous settings when you stop (to conserve your supply).    Please schedule a follow up visit in 2  months but call sooner if needed

## 2022-05-06 NOTE — Progress Notes (Unsigned)
[name], female    DOB: [birth year]   MRN: [id number]   Brief patient profile:  76 yowf quit smoking 07/19/21 s/p LULobectomy 2012 stage 1A Lamar s adjuvant rx  temporarily quit smoking 03/21/20 with admit to [facility] / acute resp failure:   05/05/2020 Discharge Date:  05/22/2020 Consultants: Pulmonology, GI, wound care, cardiology  Principal Problem: Acute respiratory failure with hypoxia and hypercapnia Active Problems: Influenza A Type 2 MI (myocardial infarction) Metabolic encephalopathy Acute systolic congestive heart failure Coronary artery disease involving native coronary artery of native heart Dilated cardiomyopathy Acute blood loss anemia COPD with acute exacerbation Resolved Problems: Acute respiratory failure with hypercapnia  Hospital Course: [name] is a 77 y.o. female who presented with complaints of shortness of breath and altered mental status. Was found to have acute hypercapnic respiratory failure was placed on BiPAP in the ER. Patient had been tested positive for the flu. Patient required intubation and was admitted to ICU. Critical care was consulted. She was started on Tamiflu for influenza. Treatment for diabetes and hypothyroidism continued. She was found to have acute on chronic systolic heart failure as well. Patient has a cardiologist [place] where she was from where she will need to follow-up at discharge. Patient had came here on a trip to [place] from [place] when this started happening. She was found to be in atrial fibrillation and placed on amiodarone and beta-blocker temporarily. Though these were held briefly during the times when she had low blood pressures. Patient also was found to have anemia which required transfusion. Has anemia was found to be iron deficiency. GI saw the patient and felt endoscopic evaluation would be good when she is medically optimized. Patient was stabilized and transferred to the floor. Her mental  status had improved very slowly her oxygenation requirements have improved as well. She has history of lung cancer and lobectomy in the past and she continued to smoke prior to this admission. She is advised to stop smoking and she will need to follow-up with pulmonology outpatient. She has improved significantly now after prolonged course. At this point, she is on 3 L nasal cannula and has been set up for home O2. Patient would like to be discharged and will be discharged home in improved condition to follow-up closely with PCP cardiology and GI and pulmonology in the near future  Physical exam: No apparent distress awake alert and oriented x3 Regular rate and rhythm Chest clear to auscultation bilaterally Abdomen soft nontender nondistended  Pertinent Labs and Studies: Recent Labs  05/20/20 0520 05/21/20 0832 05/22/20 0302  WBC 18.0* 15.2* 16.4*  HGB 7.3* 7.6* 8.7*  HCT 25.0* 26.4* 29.4*  LABPLAT 444* 424* 394*  MCV 96.2* 96.4* 93.9  NEUTOPHILPCT 79.40 -- 82.00  LYMPHOPCT 9.90 -- 8.80  MONOPCT 8.30 -- 6.80  BASOPCT 0.20 -- 0.20  EOSPCT 0.10* -- 0.10*   Recent Labs  05/20/20 0520 05/21/20 0058  NA 141 141  K 3.4* 3.8  CL 99 99  CO2 >40* >40*  BUN 11 13  CREATININE 1.08* 1.24*  GLUCOSE 116* 164*  EGFRAA 59* 50*  EGFRNONAA 51* 43*  CALCIUM 8.4 8.5   No results for input(s): PROTIME, INR, PTT in the last 72 hours. No results for input(s): AST, ALT, BILITOT, BILIDIR, ALKPHOS, LIPASE, AMYLASE, ALBUMIN, AMMONIA in the last 72 hours. Recent Labs  05/20/20 0523 05/21/20 0058  BNP 123.2* 87.0     ID and Culture Results: Recent Labs  05/21/20 0058  GLUCOSE 164*    additional hx:  MI in 2012 and underwent emergent CABG.  She was noted to have a left upper lobe lung mass.  She had a left upper lobectomy a couple of months after her CABG.  The lung lesion turned out to be a stage Ia non-small cell carcinoma.  She did not require any adjuvant therapy          History of Present Illness  06/29/2020  Pulmonary/ 1st office eval/[name]  Chief Complaint  Patient presents with   Pulmonary Consult     Referred by Megan Pol, NP. Pt with hx of left VATS- Dr [name] 2012. She states hospitalized in March 2022 with PNA, influenza and UTI. She states remote hx of taking Amiodarone.   Dyspnea:  Strolling ok / can do [employer]/ no HC parking  Cough: none  Sleep: bed is flat / on side  SABA use: prn not using  Rec Make sure you check your oxygen saturation at your highest level of activity to be sure it stays over 90% and keep track of it at least once a week, more often if breathing getting worse, and let me know if losing ground.  Add:  V/q and venous doppler ordered 06/30/20 > neg   09/11/2020  f/u ov/[name] re:  doe/ 02 dep resp failure s/p Influenza A 04/2020 in [place] on way back from [place]  - has resumed smoking since last ov per husband though she downplays this Chief Complaint  Patient presents with   Follow-up    Patient is here for PFT review and does not have concerns with her breathing.  Dyspnea:  still does shopping at [employer] and [employer] not wearing 02  Cough: none  Sleeping: flat bed / one pillow / ends up in recliner due to toss and turning  SABA use: not easing  02: sleeps on 3 lpm  Covid status:   no vaccination Rec Make sure you check your oxygen saturation  at your highest level of activity  to be sure it stays over 88% and adjust  02 flow upward to maintain this level if needed but remember to turn it back to previous settings when you stop (to conserve your supply).  Let me know if ADAPT is not helpful with your travel plans or your equipment    03/27/2021  f/u ov/[name] re: GOLD 3   maint on no inhalers/ 02 hs and prn daytime   Chief Complaint  Patient presents with   Follow-up    Breathing is unchanged. She has prod cough with clear sputum in the am.   Dyspnea:  pushing cart [employer]/ uses HC parking s 02 Cough: rattle  worse in ams > clear mucus  Sleeping: freq in recliner but starts in bed 45 degree no change pattern over last sev years  SABA use: not using but does have it  02: 2lpm hs / does not check with ex  Covid status:   never vax   Rec Only use your albuterol as a rescue medication  Ok to try albuterol 15 min before an activity (on alternating days)  that you know would usually make you short of breath  Make sure you check your oxygen saturation  AT  your highest level of activity (not after you stop)   to be sure it stays over 90%  Pulmonary follow up is as needed   05/06/2022 re-established  ov/Mikalah Skyles re: GOLD 3  maint on brovana  /budesonide/yupelri  last  pred x months  Chief Complaint  Patient presents with   Follow-up    Doing well.  SaO2 dropping with exertion as well as low activity. Drops to Low 80's  Dyspnea:  housebound/ room on 4.5 drops across the house down to 80%  Cough: minimal Sleeping: flat bed on side one pillow / fine prone  SABA use: none  02: 4.5 lpm not titrating      No obvious day to day or daytime variability or assoc excess/ purulent sputum or mucus plugs or hemoptysis or cp or chest tightness, subjective wheeze or overt sinus or hb symptoms.   Sleeping  without nocturnal  or early am exacerbation  of respiratory  c/o's or need for noct saba. Also denies any obvious fluctuation of symptoms with weather or environmental changes or other aggravating or alleviating factors except as outlined above   No unusual exposure hx or h/o childhood pna/ asthma or knowledge of premature birth.  Current Allergies, Complete Past Medical History, Past Surgical History, Family History, and Social History were reviewed in [employer] record.  ROS  The following are not active complaints unless bolded Hoarseness, sore throat, dysphagia, dental problems, itching, sneezing,  nasal congestion or discharge of excess mucus or purulent secretions, ear ache,   fever,  chills, sweats, unintended wt loss or wt gain, classically pleuritic or exertional cp,  orthopnea pnd or arm/hand swelling  or leg swelling, presyncope, palpitations, abdominal pain, anorexia, nausea, vomiting, diarrhea  or change in bowel habits or change in bladder habits, change in stools or change in urine, dysuria, hematuria,  rash, arthralgias, visual complaints, headache, numbness, weakness or ataxia or problems with walking or coordination,  change in mood or  memory.        Current Meds  Medication Sig   acetaminophen (TYLENOL) 500 MG tablet Take 500 mg by mouth every 6 (six) hours as needed for moderate pain or fever.   arformoterol (BROVANA) 15 MCG/2ML NEBU Take 2 mLs (15 mcg total) by nebulization 2 (two) times daily.   aspirin 81 MG tablet Take 81 mg by mouth at bedtime.   budesonide (PULMICORT) 0.5 MG/2ML nebulizer solution Take 2 mLs (0.5 mg total) by nebulization 2 (two) times daily.   Coenzyme Q10 (CO Q 10 PO) Take 1 capsule by mouth daily.   furosemide (LASIX) 20 MG tablet Take 1 tablet (20 mg total) by mouth daily.   KLOR-CON M10 10 MEQ tablet TAKE 1 TABLET BY MOUTH EVERY DAY (Patient taking differently: Take 10 mEq by mouth every other day.)   levothyroxine (SYNTHROID) 25 MCG tablet Take 0.5 tablets (12.5 mcg total) by mouth daily. In addition to the 50 mcg.   levothyroxine (SYNTHROID) 50 MCG tablet TAKE 1 TABLET BY MOUTH DAILY ALONG WITH 12.5 MCG EXCEPT ON SUNDAY TAKE 50 MCG   lidocaine-prilocaine (EMLA) cream Apply 1 Application topically as needed. (Patient taking differently: Apply 1 Application topically as needed (pain).)   metoprolol succinate (TOPROL-XL) 100 MG 24 hr tablet Take 0.5 tablets (50 mg total) by mouth daily. Take with or immediately following a meal.   naproxen sodium (ALEVE) 220 MG tablet Take 220 mg by mouth 2 (two) times daily as needed (pain).   Omega-3 Fatty Acids (OMEGA-3 PO) Take 1,000 mg by mouth in the morning and at bedtime.   revefenacin (YUPELRI)  175 MCG/3ML nebulizer solution Take 3 mLs (175 mcg total) by nebulization daily.   rosuvastatin (CRESTOR) 40 MG tablet Take 1 tablet (40 mg total)  by mouth daily.       Past Medical History:  Diagnosis Date   CAD (coronary artery disease)    Cancer ([place])    Dyslipidemia    GERD (gastroesophageal reflux disease)    HTN (hypertension)    Myocardial infarct (HCC)    Non-small cell lung cancer ([place]) 08/2010   Stage IA, status post left upper lobectomy July 2012   Tobacco abuse       Objective:    wts   05/06/2022       215  03/27/2021         213   09/11/20 214 lb 12.8 oz (97.4 kg)  07/27/20 213 lb 3.2 oz (96.7 kg)  07/24/20 213 lb 9.6 oz (96.9 kg)    Vital signs reviewed  05/06/2022  - Note at rest 02 sats  96% on 3lpm    General appearance:    pleasant obese wf    Crackles  insp bases   Min barr***          Assessment

## 2022-05-07 LAB — CBC WITH DIFFERENTIAL/PLATELET
Basophils Absolute: 0.1 10*3/uL (ref 0.0–0.1)
Basophils Relative: 0.8 % (ref 0.0–3.0)
Eosinophils Absolute: 0.2 10*3/uL (ref 0.0–0.7)
Eosinophils Relative: 1.6 % (ref 0.0–5.0)
HCT: 34.7 % — ABNORMAL LOW (ref 36.0–46.0)
Hemoglobin: 10.6 g/dL — ABNORMAL LOW (ref 12.0–15.0)
Lymphocytes Relative: 14.3 % (ref 12.0–46.0)
Lymphs Abs: 1.9 10*3/uL (ref 0.7–4.0)
MCHC: 30.4 g/dL (ref 30.0–36.0)
MCV: 88 fl (ref 78.0–100.0)
Monocytes Absolute: 1.6 10*3/uL — ABNORMAL HIGH (ref 0.1–1.0)
Monocytes Relative: 11.6 % (ref 3.0–12.0)
Neutro Abs: 9.7 10*3/uL — ABNORMAL HIGH (ref 1.4–7.7)
Neutrophils Relative %: 71.7 % (ref 43.0–77.0)
Platelets: 362 10*3/uL (ref 150.0–400.0)
RBC: 3.95 Mil/uL (ref 3.87–5.11)
RDW: 20.4 % — ABNORMAL HIGH (ref 11.5–15.5)
WBC: 13.5 10*3/uL — ABNORMAL HIGH (ref 4.0–10.5)

## 2022-05-07 LAB — SEDIMENTATION RATE: Sed Rate: 22 mm/hr (ref 0–30)

## 2022-05-07 NOTE — Assessment & Plan Note (Addendum)
See admit 01/28/22 amio vs imfinzi suspected, both stopped and rx steroids off by end of Jan 2024    Lab Results       EOS                                                             0.2                                   05/06/2022   Component Value Date   ESRSEDRATE 22 05/06/2022   ESRSEDRATE 81 (H) 06/29/2020       >>> No indication for more  prednisone at this this time  No neede

## 2022-05-07 NOTE — Assessment & Plan Note (Addendum)
HC03   06/29/2020  = 36 -  06/29/2020   Walked RA  2 laps @ approx 270ft each @ relatively pace  stopped due to end of study,  no sob but sats 86%   - HC03  04/19/22  = 36   Advised: Make sure you check your oxygen saturation  AT  your highest level of activity (not after you stop)   to be sure it stays over 90% and adjust  02 flow upward to maintain this level if needed but remember to turn it back to previous settings when you stop (to conserve your supply).          Each maintenance medication was reviewed in detail including emphasizing most importantly the difference between maintenance and prns and under what circumstances the prns are to be triggered using an action plan format where appropriate.  Total time for H and P, chart review, counseling, reviewing nebs/hfa/02  device(s) and generating customized AVS unique to this office visit / same day charting = 33 min

## 2022-05-07 NOTE — Assessment & Plan Note (Signed)
Quit smoking 03/21/20  - Spirometry 05/18/15   FEV1 0.8 (39%)  Ratio 0.56    Group D (now reclassified as E) in terms of symptom/risk and laba/lama/ICS  therefore appropriate rx at this point >>>  continue triple rx per neb as long as affordable and if not consider stiolto esp if steroid dep systemically or breztri (or its equivalent beneric symbicort /spiriva) if not

## 2022-05-13 DIAGNOSIS — N2889 Other specified disorders of kidney and ureter: Secondary | ICD-10-CM | POA: Insufficient documentation

## 2022-05-13 NOTE — Assessment & Plan Note (Signed)
Ct abdomen ordered for further evaluation of mass found on kidneys during recent hospitalization

## 2022-05-13 NOTE — Assessment & Plan Note (Signed)
May take low dose furosemide as needed and as prescribed

## 2022-05-13 NOTE — Assessment & Plan Note (Signed)
Continue nasal cannula oxygen use as prescribed per pulmonology

## 2022-05-13 NOTE — Assessment & Plan Note (Signed)
Recent hospitalization for acute on chronic respiratory failure. Improved. Will continue to see pulmonology for maintenance.

## 2022-05-13 NOTE — Assessment & Plan Note (Signed)
Continue regular visits with cardiology as scheduled and all medications as prescribed

## 2022-05-13 NOTE — Assessment & Plan Note (Signed)
Continues regular visits with oncology and CT surgery for surveillance.

## 2022-05-15 ENCOUNTER — Telehealth: Payer: Self-pay | Admitting: Internal Medicine

## 2022-05-15 NOTE — Telephone Encounter (Signed)
Called patient regarding April appointments, patient is notified. 

## 2022-05-16 ENCOUNTER — Ambulatory Visit: Payer: Medicare HMO

## 2022-05-16 ENCOUNTER — Other Ambulatory Visit: Payer: Medicare HMO

## 2022-05-16 ENCOUNTER — Ambulatory Visit: Payer: Medicare HMO | Admitting: Internal Medicine

## 2022-05-20 ENCOUNTER — Ambulatory Visit: Payer: Medicare HMO | Admitting: Nurse Practitioner

## 2022-05-21 ENCOUNTER — Ambulatory Visit (INDEPENDENT_AMBULATORY_CARE_PROVIDER_SITE_OTHER): Payer: Medicare HMO | Admitting: Nurse Practitioner

## 2022-05-21 ENCOUNTER — Encounter: Payer: Self-pay | Admitting: Nurse Practitioner

## 2022-05-21 VITALS — BP 122/72 | HR 87 | Ht 63.0 in | Wt 211.4 lb

## 2022-05-21 DIAGNOSIS — D72829 Elevated white blood cell count, unspecified: Secondary | ICD-10-CM | POA: Diagnosis not present

## 2022-05-21 DIAGNOSIS — I480 Paroxysmal atrial fibrillation: Secondary | ICD-10-CM | POA: Diagnosis not present

## 2022-05-21 DIAGNOSIS — Z9981 Dependence on supplemental oxygen: Secondary | ICD-10-CM | POA: Diagnosis not present

## 2022-05-21 DIAGNOSIS — C3412 Malignant neoplasm of upper lobe, left bronchus or lung: Secondary | ICD-10-CM

## 2022-05-21 DIAGNOSIS — N2889 Other specified disorders of kidney and ureter: Secondary | ICD-10-CM

## 2022-05-21 MED ORDER — AMOXICILLIN-POT CLAVULANATE 875-125 MG PO TABS: 1.0000 | ORAL_TABLET | Freq: Two times a day (BID) | ORAL | 0 refills | Status: DC

## 2022-05-21 NOTE — Progress Notes (Signed)
Established patient visit   Patient: [name]   DOB: 1945-08-03   77 y.o. Female  MRN: [id number] Visit Date: 05/21/2022   Chief Complaint  Patient presents with   Medical Management of Chronic Issues   Subjective    HPI  Follow up  Had CT abdomen due to mass found on adrenal glands -CT  results are as follows: -IMPRESSION: 1. Exophytic posterior left interpolar renal mass corresponds to finding noted on prior CT and is suspicious for neoplasm, either primary or metastatic. Adjacent ill-defined infiltrative component extends into the renal pelvis. In total, this area measures 3.9 x 3.1 cm. The renal veins are patent. 2. Heterogeneously enhancing 1.5 x 1.5 cm mass in the anterior upper pole right kidney, also suspicious for neoplasm, either primary or metastatic. 3. Interval development of left para-aortic lymphadenopathy suspicious for nodal metastases. 4. Similar partially imaged left lower lobe consolidation and diffuse right basilar reticular opacities. Similar chronic left pleural effusion/thickening. 5.  Aortic Atherosclerosis (ICD10-I70.0). She does see oncology on routine basis.  She is scheduled for follow up CT chest 06/12/2022   Medications: Outpatient Medications Prior to Visit  Medication Sig   acetaminophen (TYLENOL) 500 MG tablet Take 500 mg by mouth every 6 (six) hours as needed for moderate pain or fever.   arformoterol (BROVANA) 15 MCG/2ML NEBU Take 2 mLs (15 mcg total) by nebulization 2 (two) times daily.   aspirin 81 MG tablet Take 81 mg by mouth at bedtime.   Coenzyme Q10 (CO Q 10 PO) Take 1 capsule by mouth daily.   furosemide (LASIX) 20 MG tablet Take 1 tablet (20 mg total) by mouth daily.   KLOR-CON M10 10 MEQ tablet TAKE 1 TABLET BY MOUTH EVERY DAY (Patient taking differently: Take 10 mEq by mouth every other day.)   levothyroxine (SYNTHROID) 25 MCG tablet Take 0.5 tablets (12.5 mcg total) by mouth daily. In addition to the 50 mcg.    levothyroxine (SYNTHROID) 50 MCG tablet TAKE 1 TABLET BY MOUTH DAILY ALONG WITH 12.5 MCG EXCEPT ON SUNDAY TAKE 50 MCG   lidocaine-prilocaine (EMLA) cream Apply 1 Application topically as needed. (Patient taking differently: Apply 1 Application topically as needed (pain).)   metoprolol succinate (TOPROL-XL) 100 MG 24 hr tablet Take 0.5 tablets (50 mg total) by mouth daily. Take with or immediately following a meal.   naproxen sodium (ALEVE) 220 MG tablet Take 220 mg by mouth 2 (two) times daily as needed (pain).   Omega-3 Fatty Acids (OMEGA-3 PO) Take 1,000 mg by mouth in the morning and at bedtime.   rosuvastatin (CRESTOR) 40 MG tablet Take 1 tablet (40 mg total) by mouth daily.   [DISCONTINUED] budesonide (PULMICORT) 0.5 MG/2ML nebulizer solution Take 2 mLs (0.5 mg total) by nebulization 2 (two) times daily.   [DISCONTINUED] revefenacin (YUPELRI) 175 MCG/3ML nebulizer solution Take 3 mLs (175 mcg total) by nebulization daily.   No facility-administered medications prior to visit.    Review of Systems See HPI    Last CBC Lab Results  Component Value Date   WBC 11.7 (H) 06/17/2022   HGB 9.7 (L) 06/17/2022   HCT 32.4 (L) 06/17/2022   MCV 85.9 06/17/2022   MCH 25.7 (L) 06/17/2022   RDW 17.6 (H) 06/17/2022   PLT 355 06/17/2022   Last metabolic panel Lab Results  Component Value Date   GLUCOSE 100 (H) 06/17/2022   NA 141 06/17/2022   K 3.7 06/17/2022   CL 98 06/17/2022   CO2 37 (H) 06/17/2022  BUN 12 06/17/2022   CREATININE 0.91 06/17/2022   EGFR 59 (L) 04/19/2022   CALCIUM 9.9 06/17/2022   PHOS 4.1 02/02/2022   PROT 7.1 06/17/2022   ALBUMIN 3.4 (L) 06/17/2022   LABGLOB 2.5 04/19/2022   AGRATIO 1.4 04/19/2022   BILITOT 0.4 06/17/2022   ALKPHOS 69 06/17/2022   AST 29 06/17/2022   ALT 35 06/17/2022   ANIONGAP 6 06/17/2022   Last lipids Lab Results  Component Value Date   CHOL 99 (L) 04/19/2022   HDL 43 04/19/2022   LDLCALC 37 04/19/2022   TRIG 100 04/19/2022    CHOLHDL 2.3 04/19/2022   Last hemoglobin A1c Lab Results  Component Value Date   HGBA1C 5.8 (H) 04/19/2022   Last thyroid functions Lab Results  Component Value Date   TSH 3.011 06/17/2022   T3TOTAL 131 08/18/2018   T4TOTAL 9.6 10/18/2021   Last vitamin D Lab Results  Component Value Date   VD25OH 42.2 10/25/2020        Objective     Today's Vitals   05/21/22 1558  BP: 122/72  Pulse: 87  SpO2: 91%  Weight: 211 lb 6.4 oz (95.9 kg)  Height: 5\' 3"  (1.6 m)   Body mass index is 37.45 kg/m.  BP Readings from Last 3 Encounters:  06/20/22 (Abnormal) 116/55  05/27/22 (Abnormal) 120/54  05/21/22 122/72    Wt Readings from Last 3 Encounters:  06/20/22 214 lb 4.8 oz (97.2 kg)  05/27/22 214 lb 12.8 oz (97.4 kg)  05/21/22 211 lb 6.4 oz (95.9 kg)    Physical Exam Vitals and nursing note reviewed.  Constitutional:      Appearance: Normal appearance. She is well-developed. She is obese. She is not ill-appearing.  HENT:     Head: Normocephalic and atraumatic.     Nose: Nose normal.     Mouth/Throat:     Mouth: Mucous membranes are moist.     Pharynx: Oropharynx is clear.  Eyes:     Extraocular Movements: Extraocular movements intact.     Conjunctiva/sclera: Conjunctivae normal.     Pupils: Pupils are equal, round, and reactive to light.  Neck:     Comments: JVD present on left side of neck  Cardiovascular:     Rate and Rhythm: Normal rate and regular rhythm.     Pulses: Normal pulses.     Heart sounds: Normal heart sounds.  Pulmonary:     Effort: Pulmonary effort is normal.     Breath sounds: Wheezing and rhonchi present.     Comments: Oxygen saturation improved since her most recent visit. They are now 90%. She is currently using 3-4 lpm of nasal cannula oxygen . Abdominal:     Palpations: Abdomen is soft.  Musculoskeletal:        General: Normal range of motion.     Cervical back: Normal range of motion and neck supple.  Lymphadenopathy:     Cervical: No  cervical adenopathy.  Skin:    General: Skin is warm and dry.     Capillary Refill: Capillary refill takes less than 2 seconds.  Neurological:     General: No focal deficit present.     Mental Status: She is alert and oriented to person, place, and time.  Psychiatric:        Mood and Affect: Mood normal.        Behavior: Behavior normal.        Thought Content: Thought content normal.  Judgment: Judgment normal.       Assessment & Plan    Leukocytosis, unspecified type Assessment & Plan: Treat with augmentin 875 mg twice daily for  7 days. Cbc to be recheck during next visit with oncology.   Orders: -     Amoxicillin-Pot Clavulanate; Take 1 tablet by mouth 2 (two) times daily.  Dispense: 14 tablet; Refill: 0  Malignant neoplasm of upper lobe of left lung [facility]) Assessment & Plan: Continue regular visits with oncology and treatments as scheduled.    Mass of left kidney Assessment & Plan: Reviewed CT scan results with the patient and her husband.  Possible metastasis. Follow up with oncology as scheduled     Supplemental oxygen dependent Assessment & Plan: Continue nasal cannula oxygen use as prescribed.    Paroxysmal atrial fibrillation with RVR (HCC) Assessment & Plan: Stable. Continue regular visits with cardiology as scheduled       Return in about 3 months (around 08/20/2022) for mood, blood pressure.         [name], NP  [facility] at [facility] [phone] (phone) [phone] (fax)  [facility]

## 2022-05-23 ENCOUNTER — Other Ambulatory Visit: Payer: Self-pay | Admitting: Physician Assistant

## 2022-05-23 NOTE — Telephone Encounter (Signed)
Pt had appointment on 05/21/22

## 2022-05-24 ENCOUNTER — Other Ambulatory Visit: Payer: Self-pay | Admitting: *Deleted

## 2022-05-24 MED ORDER — BUDESONIDE 0.5 MG/2ML IN SUSP: 0.5000 mg | Freq: Two times a day (BID) | RESPIRATORY_TRACT | 11 refills | Status: DC

## 2022-05-27 ENCOUNTER — Ambulatory Visit: Payer: Medicare HMO | Attending: Cardiovascular Disease | Admitting: Cardiovascular Disease

## 2022-05-27 ENCOUNTER — Encounter: Payer: Self-pay | Admitting: Cardiovascular Disease

## 2022-05-27 VITALS — BP 120/54 | HR 86 | Ht 64.0 in | Wt 214.8 lb

## 2022-05-27 DIAGNOSIS — I480 Paroxysmal atrial fibrillation: Secondary | ICD-10-CM | POA: Diagnosis not present

## 2022-05-27 DIAGNOSIS — E039 Hypothyroidism, unspecified: Secondary | ICD-10-CM | POA: Diagnosis not present

## 2022-05-27 DIAGNOSIS — E785 Hyperlipidemia, unspecified: Secondary | ICD-10-CM

## 2022-05-27 DIAGNOSIS — Z85118 Personal history of other malignant neoplasm of bronchus and lung: Secondary | ICD-10-CM

## 2022-05-27 DIAGNOSIS — Z951 Presence of aortocoronary bypass graft: Secondary | ICD-10-CM

## 2022-05-27 DIAGNOSIS — I2510 Atherosclerotic heart disease of native coronary artery without angina pectoris: Secondary | ICD-10-CM

## 2022-05-27 DIAGNOSIS — J439 Emphysema, unspecified: Secondary | ICD-10-CM | POA: Diagnosis not present

## 2022-05-27 DIAGNOSIS — M25473 Effusion, unspecified ankle: Secondary | ICD-10-CM

## 2022-05-27 DIAGNOSIS — I2583 Coronary atherosclerosis due to lipid rich plaque: Secondary | ICD-10-CM | POA: Diagnosis not present

## 2022-05-27 DIAGNOSIS — J9601 Acute respiratory failure with hypoxia: Secondary | ICD-10-CM | POA: Diagnosis not present

## 2022-05-27 NOTE — Progress Notes (Signed)
Patient ID: [name], female   DOB: 02/14/1946, 77 y.o.   MRN: [id number] n        HPI: [name] is a 77 y.o. female who presents to the office today for a 4 month follow up cardiology evaluation.  Ms. Cumbo suffered initial anterior wall myocardial infarction with ventricular fibrillation arrest requiring resuscitation and PTCA of her LAD in 1997. On 07/05/2010 she presented with increasing shortness of breath and was found to have critical life threatening anatomy with 99% left main equivalent disease.   Later that afternoon she underwent emergent CABG revascularization surgery by Dr. Dorris Fetch with a LIMA to the LAD, a vein graft to the second branch of the obtuse marginal vessel, and vein graft to the PDA.  Chest x-rays disclosed a left upper lung nodule and ultimately this proved to be adenocarcinoma for which she ultimately underwent upper lobe resection in July 2012. Additional problems include hypertension, hyperlipidemia, obesity and probable metabolic syndrome. A nuclear perfusion study in July 2012 showed a post stress ejection fraction of 46% but otherwise fairly normal perfusion. EF at cath was 35% prior to surgery.   In December 2013 an NMR lipoprofile revealed that despite an LDL of 50 she had increased LDL particle #1312, and increased small LDL particle number at 761. Her insulin resistance was significantly elevated at 70. HDL particle number was low at 26.8 and a triglyceride of 152.evaluation.  An echo Doppler study on 08/04/2012 showed an ejection fraction at 50 - 55%. She had mild LVH. There was grade 1 diastolic dysfunction with distal anterior, anteroseptal and apical LAD scar. There is mild tricuspid regurgitation and mild mitral regurgitation. PA pressure estimated 32 mm.  A LexiScan study in 07/2012 was low risk. Ejection fraction was 56%. There is no evidence for ischemia. She had laboratory which showed an LDL particle number at 830 with a target LDL of 38  HDL 42 triglycerides 170. Her insulin resistance was elevated at 60. Hemoglobin A1c was 5.7.  When I last saw her in October 2017 she was still smoking one pack of cigarettes every 3 day.  She sees Dr. Dorris Fetch on a yearly basis and  saw him in August 2017..  A follow-up chest CT was reviewed and she was stable without evidence of local recurrence or metastatic disease status post left upper lobectomy for stage IA non-small cell carcinoma.  Over the past year, she denies any episodes of recurrent chest pain or awareness of palpitations.  She underwent a nuclear perfusion study on 11/05/2016, which revealed normal perfusion and function without scar or ischemia. She saw Dr. Dorris Fetch in August 2018 and during that evaluation, she was significantly hypertensive with a blood pressure 170/87.  She was on Toprol-XL 100 mg, rosuvastatin 40 mg, and levothyroxine 50 g.  Unfortunately she still smokes and is smoking anywhere from 1-3/4 of a pack daily.  She had laboratory in May and on one chemistry evaluation.  Her potassium was 5.5, a follow-up evaluation showed normal potassium.  Cholesterol was 123, HDL 39, and LDL 59.    When I saw her in October 2018, I initiated amlodipine 5 mg.  She was undergoing low dose CT imaging for CA.  We again discussed complete smoking cessation.    I saw her in January 2019. Over the prior year she hadshe has been going back and forth between [place] and [place].    She denies any significant shortness of breath.  She has not been very active.  Unfortunately she continues to smoke cigarettes and typically a pack of cigarettes may last 2 to 3 days. She has seen Dr. Dorris Fetch for cardiothoracic surgery follow-up in September 2019.  Laboratory in December 2019 showed an LDL cholesterol at 44, HDL 41, total cholesterol 295 and triglycerides 146.  She continued to be on amlodipine 5 mg and Toprol-XL 100 mg for hypertension and her CAD and  rosuvastatin 40 mg for  hyperlipidemia in addition to omega-3 fatty acid.  She has hypothyroidism on levothyroxine.  She continues to be on daily aspirin.    She was evaluated in January 2020 at which time she remained stable from a cardiac standpoint.  She had seen Dr. Dorris Fetch in follow-up.  She sees Dr. Sharee Holster for primary care.  She just completed lab work on January 22, 2019.  Lipid studies were excellent with total cholesterol 115, triglycerides 116, HDL 41, and LDL 53.  Renal function was stable with a BUN of 12 and creatinine 0.79.  TSH was borderline increased at 4.68.  CBC was stable.  She remains asymptomatic with reference to chest pain or shortness of breath.  She has not been successful with weight loss.    I saw her in a telemedicine evaluation in June 2021.  Unfortunately she was still smoking cigarettes.  She underwent a follow-up echo Doppler study on July 27, 2019 which showed an EF of 50 to 55% with septal hypokinesis consistent with her prior CABG revascularization.  There was mild asymmetric LVH and grade 2 diastolic dysfunction.  Tissue Doppler was abnormal with elevated left ventricular end-diastolic pressure.  She was without recurrent anginal symptoms.  I saw her in February 2022.  At that time she can continue to smoke cigarettes with a pack lasting approximately 1-1/2 days.  She had no intention to quit.  She had seen Dr. Dorris Fetch for follow-up evaluation.  Recent laboratory had showed a potassium of 5.6 and she was drinking significant amount of orange juice.  She denied palpitations.  Was having swelling and I recommended HCTZ 12.5 mg to take for swelling and then changed to on an as-needed basis.  I recommended follow-up laboratory for her potassium.  Following that evaluation, she drove to [place].  Apparently on her way home she became ill and was hospitalized in [facility] for 2 weeks.  Apparently she required 2 units of packed red blood cells.  During her hospitalization, he apparently  went into atrial fibrillation and was placed on amiodarone and beta-blocker temporarily.  He is were held at times during low blood pressures.  She was found to have iron deficiency edema.  Her medications were adjusted.  Upon return she was evaluated by Dr. Sandrea Hughs her pulmonologist.  She stopped smoking in February 2022.  She recently saw her primary provider, Mayer Masker, PAC.  Amiodarone was discontinued on July 24, 2020.  I last saw her on July 27, 2020. At that time, she felt well and denied any chest pain.  She was unaware of any recurrent atrial fibrillation.    I saw her on November 29, 2020.  Prior to that evaluation she had been seen by Dr. Charlett Lango for an annual follow-up visit.  A recent CT scan showed a new 9 mm nodular subpleural lesion in the left lower lobe.  There is concern for possible new primary bronchogenic carcinoma but also in the differential is a nodule leftover from her recent pneumonia in [place] or other infectious or inflammatory nodules.  As result, a  short-term interval follow-up with a skin scan has been ordered and is scheduled for December 21, 2020 with follow-up with Dr. Dorris Fetch on December 26, 2020.  The patient admits that she has continued to smoke cigarettes and still smokes 1 pack of cigarettes every 3 days.  She states at home she has supplemental oxygen and uses it to sleep.  She rarely notes her oxygen saturation greater than 88%.  She has underlying COPD and emphysema.  She is unaware of any chest tightness but admits to exertional dyspnea.    On her short-term interval follow-up PET/CT the nodule was markedly hypermetabolic and there was no evidence for regional or distant metastases.  She findings were consistent with non-small cell CA.  She was not felt to be a good surgical candidate and had limited pulmonary reserve.  Stereotactic radiation was recommended.  She underwent a Lexiscan Myoview study on December 28, 2020 which was low risk and  demonstrated septal and apical thinning versus small prior infarct.  There was no ischemia.  Gated EF was 46% with global hypokinesis.  I last saw her on March 30, 2021 at which time she had undergone 5 radiation treatments.  She denies chest pain or shortness of breath.  Unfortunately she still smokes cigarettes, currently 1 pack lasting 3 days.  She has not had recent laboratory.    Since I last saw her, she was hospitalized in June 2023 with increasing shortness of breath and hypoxia.  Chest x-ray demonstrated opacification of the left hemothorax.  She developed paroxysmal atrial flutter was placed on amiodarone.  Bronchoscopy for left hilar mass demonstrated total occlusion of the left lower lobe bronchus.  She was not anticoagulated due to concern for amyloid angiopathy which was seen on her MRI of her head with evidence of chronic peripheral microhemorrhage.  She had mildly elevated troponins felt to be related to demand ischemia.    She was seen in follow-up by Edd Fabian, NP in November 2023 and was on maintenance immunotherapy every month for her cancer and was on 3 L of supplemental oxygen.  She was rehospitalized from December 11 through December 22 with acute hypoxia superimposed on chronic respiratory failure with concern of pneumonia versus amiodarone toxicity versus spread of lymphangitic non-small cell cancer.  CT did not reveal any PE but showed new groundglass opacities and interstitial opacities.  She was started on antibiotics, steroids and Lasix.  She received high-dose Solu-Medrol and Rocephin/azithromycin.  Amiodarone was discontinued.  An echo Doppler study on December 4 showed EF 55 to 60% with grade 1 diastolic dysfunction.  RV systolic function was mildly reduced.  There was trivial MR.  She is followed by Dr. Shirline Frees for her non-small cell CA.  She continues to be on immunotherapy.  She is on 4 to 5 L of supplemental oxygen.  She quit tobacco in June 2023.  She recently  saw Dr. Sherene Sires on May 06, 2022 for pulmonary follow-up evaluation.  Presently, she denies any chest pain.  She does admit to some occasional ankle edema.  An ECG in February 2024 showed sinus rhythm at 86 bpm.  She is on furosemide 20 mg daily and takes metoprolol succinate 50 mg daily.  She is on [place], Pulmicort, and Yupelri.  She continues to be on rosuvastatin for hyperlipidemia.  Lipid studies in March 2024 showed total cholesterol 99, HDL 43, LDL 37 and triglycerides 100.  Hemoglobin A1c was 5.8.  Creatinine was 1.0.  LFTs were normal.  She presents for  cardiology evaluation.   Past Medical History:  Diagnosis Date   CAD (coronary artery disease)    Cancer    COPD (chronic obstructive pulmonary disease)    per DM note, pt denies   Dyslipidemia    GERD (gastroesophageal reflux disease)    History of radiation therapy    Left lung- 01/16/21-01/30/21- Dr. Antony BlackbirdJames Kinard   History of radiation therapy    Left lung- 08/02/21-09/18/21- Dr. Antony BlackbirdJames Kinard   HTN (hypertension)    Hypothyroidism    Myocardial infarct    Non-small cell lung cancer 08/19/2010   Stage IA, status post left upper lobectomy July 2012   Tobacco abuse     Past Surgical History:  Procedure Laterality Date   CARDIAC CATHETERIZATION  07/06/2010   see CABG report - pt sent to OR   CARDIOVASCULAR STRESS TEST  09/11/2010   R/S MV - normal perfusion in all regions, EF 46%, no scintigraphic evidence of inducible myocardial ischemia; global LV systolic function mildly reduced; no significant wall motion abnormalities noted; Exercise capacity 7 METS; EKG negative for ischemia; low risk study, no signifcant change from previous study 08/2003   CORONARY ARTERY BYPASS GRAFT  07/11/2010   LIMA to LAD; SVG to 2nd branch OM; SVG to posterior descending artery   IR IMAGING GUIDED PORT INSERTION  08/16/2021   LEFT VATS  09/17/2010   TEE WITHOUT CARDIOVERSION  07/06/2010   during emergent CABG surgery; 2-3+ mitral regurgitation    VIDEO BRONCHOSCOPY N/A 07/27/2021   Procedure: VIDEO BRONCHOSCOPY;  Surgeon: Loreli SlotHendrickson, Steven C, MD;  Location: [facility] OR;  Service: Thoracic;  Laterality: N/A;   VIDEO BRONCHOSCOPY WITH ENDOBRONCHIAL ULTRASOUND N/A 07/27/2021   Procedure: VIDEO BRONCHOSCOPY WITH ENDOBRONCHIAL ULTRASOUND;  Surgeon: Loreli SlotHendrickson, Steven C, MD;  Location: [facility] OR;  Service: Thoracic;  Laterality: N/A;    Allergies  Allergen Reactions   Amiodarone Shortness Of Breath    Contraindicated for patients on chemotherapy or immunotherapy    Codeine Other (See Comments)    Unknown reaction Patient avoids this medication    Current Outpatient Medications  Medication Sig Dispense Refill   acetaminophen (TYLENOL) 500 MG tablet Take 500 mg by mouth every 6 (six) hours as needed for moderate pain or fever.     amoxicillin-clavulanate (AUGMENTIN) 875-125 MG tablet Take 1 tablet by mouth 2 (two) times daily. 14 tablet 0   arformoterol (BROVANA) 15 MCG/2ML NEBU Take 2 mLs (15 mcg total) by nebulization 2 (two) times daily. 120 mL 11   aspirin 81 MG tablet Take 81 mg by mouth at bedtime.     budesonide (PULMICORT) 0.5 MG/2ML nebulizer solution Take 2 mLs (0.5 mg total) by nebulization 2 (two) times daily. 120 mL 11   Coenzyme Q10 (CO Q 10 PO) Take 1 capsule by mouth daily.     furosemide (LASIX) 20 MG tablet Take 1 tablet (20 mg total) by mouth daily. 90 tablet 1   KLOR-CON M10 10 MEQ tablet TAKE 1 TABLET BY MOUTH EVERY DAY (Patient taking differently: Take 10 mEq by mouth every other day.) 90 tablet 1   levothyroxine (SYNTHROID) 25 MCG tablet Take 0.5 tablets (12.5 mcg total) by mouth daily. In addition to the 50 mcg. 45 tablet 1   levothyroxine (SYNTHROID) 50 MCG tablet TAKE 1 TABLET BY MOUTH DAILY ALONG WITH 12.5 MCG EXCEPT ON SUNDAY TAKE 50 MCG 90 tablet 1   lidocaine-prilocaine (EMLA) cream Apply 1 Application topically as needed. (Patient taking differently: Apply 1 Application topically as needed (pain).)  30 g 2   metoprolol  succinate (TOPROL-XL) 100 MG 24 hr tablet Take 0.5 tablets (50 mg total) by mouth daily. Take with or immediately following a meal. 90 tablet 1   naproxen sodium (ALEVE) 220 MG tablet Take 220 mg by mouth 2 (two) times daily as needed (pain).     Omega-3 Fatty Acids (OMEGA-3 PO) Take 1,000 mg by mouth in the morning and at bedtime.     revefenacin (YUPELRI) 175 MCG/3ML nebulizer solution Take 3 mLs (175 mcg total) by nebulization daily. 90 mL 1   rosuvastatin (CRESTOR) 40 MG tablet Take 1 tablet (40 mg total) by mouth daily. 90 tablet 3   No current facility-administered medications for this visit.    Also she is married has 3 children 5 grandchildren. She is an ex-smoker. She does not routinely exercise. There is no alcohol use.  ROS General: Negative; No fevers, chills, or night sweats; positive for purposeful weight loss HEENT: Negative; No changes in vision or hearing, sinus congestion, difficulty swallowing Pulmonary: Non-small cell CA Cardiovascular: See HPI;  GI: Negative; No nausea, vomiting, diarrhea, or abdominal pain GU: Negative; No dysuria, hematuria, or difficulty voiding Musculoskeletal: Negative; no myalgias, joint pain, or weakness Hematologic/Oncology: Negative; no easy bruising, bleeding Endocrine: Positive for insulin resistance; no heat/cold intolerance; no diabetes Neuro: Negative; no changes in balance, headaches Skin: Negative; No rashes or skin lesions Psychiatric: Negative; No behavioral problems, depression Sleep: Negative; No snoring, daytime sleepiness, hypersomnolence, bruxism, restless legs, hypnogognic hallucinations, no cataplexy Other comprehensive 14 point system review is negative.   PE BP (!) 120/54   Pulse 86   Ht  (1.626 m)   Wt 214 lb 12.8 oz (97.4 kg)   SpO2 90%   BMI 36.87 kg/m    Repeat blood pressure by me 110/60  Wt Readings from Last 3 Encounters:  05/27/22 214 lb 12.8 oz (97.4 kg)  05/21/22 211 lb 6.4 oz (95.9 kg)  05/06/22  215 lb 12.8 oz (97.9 kg)   General: Alert, oriented, no distress.  On supplemental oxygen at 4 to 5 L Skin: normal turgor, no rashes, warm and dry HEENT: Normocephalic, atraumatic. Pupils equal round and reactive to light; sclera anicteric; extraocular muscles intact;  Nose without nasal septal hypertrophy Mouth/Parynx benign; Mallinpatti scale 3 Neck: No JVD, no carotid bruits; normal carotid upstroke Lungs: Decreased breath sounds.  No wheezing Chest wall: without tenderness to palpitation Heart: PMI not displaced, RRR, s1 s2 normal, 1/6 systolic murmur, no diastolic murmur, no rubs, gallops, thrills, or heaves Abdomen: soft, nontender; no hepatosplenomehaly, BS+; abdominal aorta nontender and not dilated by palpation. Back: no CVA tenderness Pulses 2+ Musculoskeletal: full range of motion, normal strength, no joint deformities Extremities: 1-2+ ankle edema no clubbing cyanosis, Homan's sign negative  Neurologic: grossly nonfocal; Cranial nerves grossly wnl Psychologic: Normal mood and affect    I personally reviewed March 22, 2022  ECG (independently read by me): NSR at 86  March 30, 2021 ECG (independently read by me): NSR at 82, Inferior Q III, PRWP, QTc 462 msec  November 29, 2020 ECG (independently read by me):  NSR at 76, Q III, QTc 463 msec  July 24, 2020 ECG (independently read by me):  NSR at 72; PRWP; QTc 466 msec  February 2020 ECG (independently read by me): NSR at 74; QS V1-3, no ectopy, normal intervls  December 2020 ECG (independently read by me): Normal sinus rhythm at 74 bpm.  Poor R wave progression with QS complex V1  through V4.  No ectopy.  Normal intervals.  January 2020 ECG (independently read by me): Normal sinus rhythm at 73 bpm.  Anteroseptal QS complex  January 2019 ECG (independently read by me): normal sinus rhythm at 76 bpm.  Q wave in lead 3.  Nonspecific T changes.  October 2018 ECG (independently read by me): Normal sinus rhythm at 73 bpm.  Q  wave in 3 and very small Q wave in aVF.  Nonspecific T changes anteriorly.  Normal intervals.  October 2017 ECG (independently read by me): Normal sinus rhythm at 65 bpm.  Nonspecific T changes.  Q wave in lead 3.  October 2016 ECG (independently read by me): Normal sinus rhythm at 77 bpm.  Nonspecific T changes anteriorly which are old.  Normal intervals.  September 2015 ECG (independently read by me): Normal sinus rhythm at 70 beats per minute.  Poor progression anteriorly.  Small Q-wave in lead 3.  Nonspecific T changes anteriorly, unchanged  04/16/2013 ECG (independently read by me): Normal sinus rhythm at 65 beats per minute. T wave changes V1 through V6, nonspecific, unchanged. QTc interval 455 ms  Prior ECG of October 15 2012: NSR @ 62 bpm with T-wave abnormality anteriorly V1 through V5 unchanged.  LABS:    Latest Ref Rng & Units 04/19/2022    9:44 AM 03/20/2022    2:24 PM 02/14/2022   10:50 AM  BMP  Glucose 70 - 99 mg/dL 86  597  91   BUN 8 - 27 mg/dL 9  17  19    Creatinine 0.57 - 1.00 mg/dL 4.16  3.84  5.36   BUN/Creat Ratio 12 - 28 9     Sodium 134 - 144 mmol/L 145  144  144   Potassium 3.5 - 5.2 mmol/L 4.0  4.7  3.9   Chloride 96 - 106 mmol/L 99  99  99   CO2 20 - 29 mmol/L 28  36  41   Calcium 8.7 - 10.3 mg/dL 9.3  9.5  9.0       Latest Ref Rng & Units 04/19/2022    9:44 AM 02/14/2022   10:50 AM 02/07/2022    4:16 AM  Hepatic Function  Total Protein 6.0 - 8.5 g/dL 6.0  5.8  5.4   Albumin 3.8 - 4.8 g/dL 3.5  3.2  2.4   AST 0 - 40 IU/L 31  89  116   ALT 0 - 32 IU/L 24  251  322   Alk Phosphatase 44 - 121 IU/L 73  82  76   Total Bilirubin 0.0 - 1.2 mg/dL 0.3  0.3  0.4       Latest Ref Rng & Units 05/06/2022    3:49 PM 04/19/2022    9:44 AM 03/20/2022    2:24 PM  CBC  WBC 4.0 - 10.5 K/uL 13.5  10.2  10.5   Hemoglobin 12.0 - 15.0 g/dL 46.8  9.5  03.2   Hematocrit 36.0 - 46.0 % 34.7  31.2  34.0   Platelets 150.0 - 400.0 K/uL 362.0  358  280.0    Lab Results   Component Value Date   MCV 88.0 05/06/2022   MCV 89 04/19/2022   MCV 88.6 03/20/2022   Lab Results  Component Value Date   TSH 3.340 04/19/2022   Lab Results  Component Value Date   HGBA1C 5.8 (H) 04/19/2022   Lipid Panel     Component Value Date/Time   CHOL 99 (L) 04/19/2022 0944  CHOL 114 07/22/2012 0840   TRIG 100 04/19/2022 0944   TRIG 170 (H) 07/22/2012 0840   HDL 43 04/19/2022 0944   HDL 42 07/22/2012 0840   CHOLHDL 2.3 04/19/2022 0944   CHOLHDL 3.2 06/26/2016 0859   VLDL 25 06/26/2016 0859   LDLCALC 37 04/19/2022 0944   LDLCALC 38 07/22/2012 0840      RADIOLOGY: No results found.  IMPRESSION:  1. Coronary artery disease due to lipid rich plaque   2. Hx of CABG: 2012   3. History of lung cancer   4. Hyperlipidemia with target LDL less than 70   5. Ankle edema   6. Paroxysmal atrial fibrillation   7. Acquired hypothyroidism   8. Pulmonary emphysema, unspecified emphysema type      ASSESSMENT AND PLAN: Ms. Constantino is a 77 year old Caucasian female who is 27 years following her initial anterior wall myocardial infarction with VF arrest for which she underwent PTCA emergently of her LAD in 1997. She is 12 years following emergency CABG surgery for life-threatening anatomy with 99% left main disease and total occlusion of her RCA at the ostium with left to right collaterals. Her ejection fraction did improve and is now 50-55% with small area of subtle LAD scar.  A follow-up myocardial perfusion study on 11/05/2016 continued to show normal perfusion without scar or ischemia .  She was fortuitously found to have cancer, stage Ia, non-small cell carcinoma.  Unfortunately, she had continued to smoke cigarettes.  She has been aggressively treated for blood pressure control and lipid management.  She went into atrial fibrillation when she was hospitalized in [place] traveling back from [place].  Her echo Doppler study in September 2022 showed low normal LV function  with EF at 50 to 55% with mildly decreased LV global strain with grade 1 diastolic dysfunction.  Unfortunately, she developed a new 9 mm subpleural nodule in the left lower lobe which was significantly hypermetabolic and consistent with the metachronus T1, N0, stage Ia non-small cell carcinoma.  In follow-up of her CAD/CABG, a Lexiscan Myoview study in November 2022 was low risk without ischemia.  I have reviewed her subsequent hospitalization since my last evaluation.  She had developed acute hypoxia secondary to left lower lobe collapse due to endobronchial obstruction tumor obstruction.  She developed atrial flutter treated with IV amiodarone.  Subsequently, she required rehospitalization for acute on chronic respiratory failure felt contributed by immunotherapy versus amiodarone toxicity.  She is now been off amiodarone therapy.  She has been maintaining sinus rhythm.  Her echo Doppler study from December 2023 showed EF estimated 55 to 60% with grade 1 diastolic dysfunction.  RV systolic function was mildly reduced.  There were no significant valvular abnormalities except trivial mitral regurgitation.  Presently, she is on supplemental oxygen at 4 to 5 L/min.  She recently saw Dr. Sharee Pimple for pulmonary evaluation.  She is followed by Dr. Shirline Frees for her non-small cell therapy And treated with radiation as well as immunotherapy.  Her blood pressure today is stable and on repeat by me was 110/60.  She continues to be on furosemide 20 mg and has experienced some occasional ankle swelling.  I have suggested that she can take an extra Lasix 20 mg on a as needed basis depending upon her ankle edema.  She is on levothyroxine 75 mcg for hypothyroidism.  She continues to be on metoprolol succinate 50 mg daily without recurrent PAF.  She is on rosuvastatin 40 mg for hyperlipidemia with most recent lipid  studies excellent with an LDL cholesterol at 37.  She has a follow-up appointment to see Dr. Shirline Frees on May 2 for  oncologic follow-up and will have follow-up evaluation with Dr. Sherene Sires next month.  She is scheduled to see Jorja Loa for atrial fibrillation clinic in June.  I have recommended she see Edd Fabian, NP in 6 months and I will see her in 1 year for follow-up evaluation.  She has undergone 5 radiation treatments so far.  She is without angina and denies significant shortness of breath.  Unfortunately she still smokes cigarettes although reduced now at 1 pack lasting 3 days.  She has COPD and emphysema.  I have recommended follow-up laboratory with a comprehensive metabolic panel, CBC, TSH, hemoglobin A1c and lipid studies.  She sees Vincent Gros, NP at [facility] for primary care.  I will see her in 6 months for reevaluation or sooner as needed.   Lennette Bihari, MD, [facility]  06/02/2022 10:00 AM

## 2022-05-27 NOTE — Patient Instructions (Signed)
Medication Instructions:  Your physician recommends that you continue on your current medications as directed. Please refer to the Current Medication list given to you today.  *If you need a refill on your cardiac medications before your next appointment, please call your pharmacy*   Follow-Up: At [facility], you and your health needs are our priority.  As part of our continuing mission to provide you with exceptional heart care, we have created designated Provider Care Teams.  These Care Teams include your primary Cardiologist (physician) and Advanced Practice Providers (APPs -  Physician Assistants and Nurse Practitioners) who all work together to provide you with the care you need, when you need it.  We recommend signing up for the patient portal called "MyChart".  Sign up information is provided on this After Visit Summary.  MyChart is used to connect with patients for Virtual Visits (Telemedicine).  Patients are able to view lab/test results, encounter notes, upcoming appointments, etc.  Non-urgent messages can be sent to your provider as well.   To learn more about what you can do with MyChart, go to [email or web address].    Your next appointment:   6 months with Edd Fabian NP 12 months with Dr. Tresa Endo

## 2022-06-02 ENCOUNTER — Encounter: Payer: Self-pay | Admitting: Cardiovascular Disease

## 2022-06-03 ENCOUNTER — Other Ambulatory Visit: Payer: Self-pay | Admitting: Internal Medicine

## 2022-06-06 ENCOUNTER — Telehealth: Payer: Self-pay | Admitting: Internal Medicine

## 2022-06-06 NOTE — Telephone Encounter (Signed)
Rescheduled per 05/01 appointment due to provider pal, patient is notified.

## 2022-06-17 ENCOUNTER — Inpatient Hospital Stay: Payer: Medicare HMO | Attending: Internal Medicine

## 2022-06-17 ENCOUNTER — Encounter (HOSPITAL_COMMUNITY): Payer: Self-pay

## 2022-06-17 ENCOUNTER — Ambulatory Visit (HOSPITAL_COMMUNITY)
Admission: RE | Admit: 2022-06-17 | Discharge: 2022-06-17 | Disposition: A | Discharge status: Home / Self Care | Payer: Medicare HMO | Source: Ambulatory Visit | Attending: Internal Medicine | Admitting: Internal Medicine

## 2022-06-17 DIAGNOSIS — J90 Pleural effusion, not elsewhere classified: Secondary | ICD-10-CM | POA: Insufficient documentation

## 2022-06-17 DIAGNOSIS — Z95828 Presence of other vascular implants and grafts: Secondary | ICD-10-CM

## 2022-06-17 DIAGNOSIS — N2889 Other specified disorders of kidney and ureter: Secondary | ICD-10-CM | POA: Insufficient documentation

## 2022-06-17 DIAGNOSIS — Z923 Personal history of irradiation: Secondary | ICD-10-CM | POA: Diagnosis not present

## 2022-06-17 DIAGNOSIS — C3490 Malignant neoplasm of unspecified part of unspecified bronchus or lung: Secondary | ICD-10-CM

## 2022-06-17 DIAGNOSIS — J439 Emphysema, unspecified: Secondary | ICD-10-CM | POA: Diagnosis not present

## 2022-06-17 DIAGNOSIS — R0602 Shortness of breath: Secondary | ICD-10-CM | POA: Diagnosis not present

## 2022-06-17 DIAGNOSIS — C3432 Malignant neoplasm of lower lobe, left bronchus or lung: Secondary | ICD-10-CM | POA: Insufficient documentation

## 2022-06-17 DIAGNOSIS — Z79899 Other long term (current) drug therapy: Secondary | ICD-10-CM | POA: Diagnosis not present

## 2022-06-17 DIAGNOSIS — C3492 Malignant neoplasm of unspecified part of left bronchus or lung: Secondary | ICD-10-CM

## 2022-06-17 LAB — CMP (CANCER CENTER ONLY)
ALT: 35 U/L (ref 0–44)
AST: 29 U/L (ref 15–41)
Albumin: 3.4 g/dL — ABNORMAL LOW (ref 3.5–5.0)
Alkaline Phosphatase: 69 U/L (ref 38–126)
Anion gap: 6 (ref 5–15)
BUN: 12 mg/dL (ref 8–23)
CO2: 37 mmol/L — ABNORMAL HIGH (ref 22–32)
Calcium: 9.9 mg/dL (ref 8.9–10.3)
Chloride: 98 mmol/L (ref 98–111)
Creatinine: 0.91 mg/dL (ref 0.44–1.00)
GFR, Estimated: >60 mL/min (ref >60)
Glucose, Bld: 100 mg/dL — ABNORMAL HIGH (ref 70–99)
Potassium: 3.7 mmol/L (ref 3.5–5.1)
Sodium: 141 mmol/L (ref 135–145)
Total Bilirubin: 0.4 mg/dL (ref 0.3–1.2)
Total Protein: 7.1 g/dL (ref 6.5–8.1)

## 2022-06-17 LAB — CBC WITH DIFFERENTIAL (CANCER CENTER ONLY)
Abs Immature Granulocytes: 0.05 10*3/uL (ref 0.00–0.07)
Basophils Absolute: 0.1 10*3/uL (ref 0.0–0.1)
Basophils Relative: 1 %
Eosinophils Absolute: 0.1 10*3/uL (ref 0.0–0.5)
Eosinophils Relative: 1 %
HCT: 32.4 % — ABNORMAL LOW (ref 36.0–46.0)
Hemoglobin: 9.7 g/dL — ABNORMAL LOW (ref 12.0–15.0)
Immature Granulocytes: 0 %
Lymphocytes Relative: 13 %
Lymphs Abs: 1.5 10*3/uL (ref 0.7–4.0)
MCH: 25.7 pg — ABNORMAL LOW (ref 26.0–34.0)
MCHC: 29.9 g/dL — ABNORMAL LOW (ref 30.0–36.0)
MCV: 85.9 fL (ref 80.0–100.0)
Monocytes Absolute: 1 10*3/uL (ref 0.1–1.0)
Monocytes Relative: 9 %
Neutro Abs: 8.9 10*3/uL — ABNORMAL HIGH (ref 1.7–7.7)
Neutrophils Relative %: 76 %
Platelet Count: 355 10*3/uL (ref 150–400)
RBC: 3.77 MIL/uL — ABNORMAL LOW (ref 3.87–5.11)
RDW: 17.6 % — ABNORMAL HIGH (ref 11.5–15.5)
WBC Count: 11.7 10*3/uL — ABNORMAL HIGH (ref 4.0–10.5)
nRBC: 0 % (ref 0.0–0.2)

## 2022-06-17 LAB — TSH: TSH: 3.011 u[IU]/mL (ref 0.350–4.500)

## 2022-06-17 MED ORDER — HEPARIN SOD (PORK) LOCK FLUSH 100 UNIT/ML IV SOLN
INTRAVENOUS | Status: AC
  Filled 2022-06-17: qty 5

## 2022-06-17 MED ORDER — SODIUM CHLORIDE 0.9% FLUSH
10.0000 mL | Freq: Once | INTRAVENOUS | Status: AC
  Administered 2022-06-17: 10 mL

## 2022-06-17 MED ORDER — IOHEXOL 300 MG/ML  SOLN
75.0000 mL | Freq: Once | INTRAMUSCULAR | Status: AC | PRN
  Administered 2022-06-17: 75 mL via INTRAVENOUS

## 2022-06-17 MED ORDER — HEPARIN SOD (PORK) LOCK FLUSH 100 UNIT/ML IV SOLN
500.0000 [IU] | Freq: Once | INTRAVENOUS | Status: AC
  Administered 2022-06-17: 500 [IU] via INTRAVENOUS

## 2022-06-17 MED ORDER — SODIUM CHLORIDE (PF) 0.9 % IJ SOLN
INTRAMUSCULAR | Status: AC
  Filled 2022-06-17: qty 50

## 2022-06-19 ENCOUNTER — Ambulatory Visit: Payer: Medicare HMO | Admitting: Internal Medicine

## 2022-06-20 ENCOUNTER — Inpatient Hospital Stay: Payer: Medicare HMO | Attending: Internal Medicine | Admitting: Internal Medicine

## 2022-06-20 VITALS — BP 116/55 | HR 94 | Temp 97.9°F | Resp 22 | Wt 214.3 lb

## 2022-06-20 DIAGNOSIS — C3492 Malignant neoplasm of unspecified part of left bronchus or lung: Secondary | ICD-10-CM | POA: Insufficient documentation

## 2022-06-20 DIAGNOSIS — Z923 Personal history of irradiation: Secondary | ICD-10-CM | POA: Insufficient documentation

## 2022-06-20 DIAGNOSIS — J90 Pleural effusion, not elsewhere classified: Secondary | ICD-10-CM | POA: Diagnosis not present

## 2022-06-20 DIAGNOSIS — J449 Chronic obstructive pulmonary disease, unspecified: Secondary | ICD-10-CM | POA: Insufficient documentation

## 2022-06-20 DIAGNOSIS — C3490 Malignant neoplasm of unspecified part of unspecified bronchus or lung: Secondary | ICD-10-CM

## 2022-06-20 DIAGNOSIS — Z9221 Personal history of antineoplastic chemotherapy: Secondary | ICD-10-CM | POA: Insufficient documentation

## 2022-06-20 DIAGNOSIS — N2889 Other specified disorders of kidney and ureter: Secondary | ICD-10-CM | POA: Insufficient documentation

## 2022-06-20 NOTE — Assessment & Plan Note (Signed)
Continue nasal cannula oxygen use as prescribed.

## 2022-06-20 NOTE — Assessment & Plan Note (Signed)
Reviewed CT scan results with the patient and her husband.  Possible metastasis. Follow up with oncology as scheduled

## 2022-06-20 NOTE — Assessment & Plan Note (Signed)
Continue regular visits with oncology and treatments as scheduled.

## 2022-06-20 NOTE — Progress Notes (Signed)
[facility] Telephone:([phone]) [phone]   Fax:([phone]) [phone]  OFFICE PROGRESS NOTE  [name], NP [address] [place] [address]  DIAGNOSIS:  Stage IIIa (T3, N2, M0) non-small cell lung cancer, squamous cell carcinoma presented with left hilar mass with suspicious mediastinal lymphadenopathy on the last CT scan of the chest at [facility] health.  PRIOR THERAPY:  1) A course of concurrent chemoradiation with weekly carboplatin for AUC of 2 and paclitaxel 45 Mg/M2.  Status post 6 cycles. 2) Consolidation treatment with immunotherapy with Imfinzi 1500 Mg IV every 4 weeks.  First dose October 18, 2021.  Status post 4 cycles.  Last dose was given on January 14, 2022 discontinued secondary to suspicious immunotherapy mediated pneumonitis  CURRENT THERAPY: Observation.  INTERVAL HISTORY: [name] 77 y.o. female returns to the clinic today for follow-up visit accompanied by her husband.  The patient continues to have the baseline shortness of breath and she is currently on home oxygen around 5 L/min.  She denied having any chest pain but has mild cough with no hemoptysis.  She is followed by Dr. Sherene Sires for her COPD.  She denied having any nausea, vomiting, diarrhea or constipation.  She has no headache or visual changes.  She denied having any recent weight loss or night sweats.  She is here today for evaluation with repeat CT scan of the chest for restaging of her disease.  MEDICAL HISTORY: Past Medical History:  Diagnosis Date   CAD (coronary artery disease)    Cancer (HCC)    COPD (chronic obstructive pulmonary disease) (HCC)    per DM note, pt denies   Dyslipidemia    GERD (gastroesophageal reflux disease)    History of radiation therapy    Left lung- 01/16/21-01/30/21- Dr. Antony Blackbird   History of radiation therapy    Left lung- 08/02/21-09/18/21- Dr. Antony Blackbird   HTN (hypertension)    Hypothyroidism    Myocardial infarct [facility])     Non-small cell lung cancer (HCC) 08/19/2010   Stage IA, status post left upper lobectomy July 2012   Tobacco abuse     ALLERGIES:  is allergic to amiodarone and codeine.  MEDICATIONS:  Current Outpatient Medications  Medication Sig Dispense Refill   acetaminophen (TYLENOL) 500 MG tablet Take 500 mg by mouth every 6 (six) hours as needed for moderate pain or fever.     amoxicillin-clavulanate (AUGMENTIN) 875-125 MG tablet Take 1 tablet by mouth 2 (two) times daily. 14 tablet 0   arformoterol (BROVANA) 15 MCG/2ML NEBU Take 2 mLs (15 mcg total) by nebulization 2 (two) times daily. 120 mL 11   aspirin 81 MG tablet Take 81 mg by mouth at bedtime.     budesonide (PULMICORT) 0.5 MG/2ML nebulizer solution Take 2 mLs (0.5 mg total) by nebulization 2 (two) times daily. 120 mL 11   Coenzyme Q10 (CO Q 10 PO) Take 1 capsule by mouth daily.     furosemide (LASIX) 20 MG tablet Take 1 tablet (20 mg total) by mouth daily. 90 tablet 1   KLOR-CON M10 10 MEQ tablet TAKE 1 TABLET BY MOUTH EVERY DAY (Patient taking differently: Take 10 mEq by mouth every other day.) 90 tablet 1   levothyroxine (SYNTHROID) 25 MCG tablet Take 0.5 tablets (12.5 mcg total) by mouth daily. In addition to the 50 mcg. 45 tablet 1   levothyroxine (SYNTHROID) 50 MCG tablet TAKE 1 TABLET BY MOUTH DAILY ALONG WITH 12.5 MCG  EXCEPT ON SUNDAY TAKE 50 MCG 90 tablet 1   lidocaine-prilocaine (EMLA) cream Apply 1 Application topically as needed. (Patient taking differently: Apply 1 Application topically as needed (pain).) 30 g 2   metoprolol succinate (TOPROL-XL) 100 MG 24 hr tablet Take 0.5 tablets (50 mg total) by mouth daily. Take with or immediately following a meal. 90 tablet 1   naproxen sodium (ALEVE) 220 MG tablet Take 220 mg by mouth 2 (two) times daily as needed (pain).     Omega-3 Fatty Acids (OMEGA-3 PO) Take 1,000 mg by mouth in the morning and at bedtime.     rosuvastatin (CRESTOR) 40 MG tablet Take 1 tablet (40 mg total) by mouth  daily. 90 tablet 3   YUPELRI 175 MCG/3ML nebulizer solution INHALE VIA NEBULIZER DAILY 90 mL 1   No current facility-administered medications for this visit.    SURGICAL HISTORY:  Past Surgical History:  Procedure Laterality Date   CARDIAC CATHETERIZATION  07/06/2010   see CABG report - pt sent to OR   CARDIOVASCULAR STRESS TEST  09/11/2010   R/S MV - normal perfusion in all regions, EF 46%, no scintigraphic evidence of inducible myocardial ischemia; global LV systolic function mildly reduced; no significant wall motion abnormalities noted; Exercise capacity 7 METS; EKG negative for ischemia; low risk study, no signifcant change from previous study 08/2003   CORONARY ARTERY BYPASS GRAFT  07/11/2010   LIMA to LAD; SVG to 2nd branch OM; SVG to posterior descending artery   IR IMAGING GUIDED PORT INSERTION  08/16/2021   LEFT VATS  09/17/2010   TEE WITHOUT CARDIOVERSION  07/06/2010   during emergent CABG surgery; 2-3+ mitral regurgitation   VIDEO BRONCHOSCOPY N/A 07/27/2021   Procedure: VIDEO BRONCHOSCOPY;  Surgeon: Loreli Slot, MD;  Location: [facility] OR;  Service: Thoracic;  Laterality: N/A;   VIDEO BRONCHOSCOPY WITH ENDOBRONCHIAL ULTRASOUND N/A 07/27/2021   Procedure: VIDEO BRONCHOSCOPY WITH ENDOBRONCHIAL ULTRASOUND;  Surgeon: Loreli Slot, MD;  Location: [facility] OR;  Service: Thoracic;  Laterality: N/A;    REVIEW OF SYSTEMS:  Constitutional: positive for fatigue Eyes: negative Ears, nose, mouth, throat, and face: negative Respiratory: positive for cough and dyspnea on exertion Cardiovascular: negative Gastrointestinal: negative Genitourinary:negative Integument/breast: negative Hematologic/lymphatic: negative Musculoskeletal:positive for muscle weakness Neurological: negative Behavioral/Psych: negative Endocrine: negative Allergic/Immunologic: negative   PHYSICAL EXAMINATION: General appearance: alert, cooperative, fatigued, and no distress Head: Normocephalic, without  obvious abnormality, atraumatic Neck: no adenopathy, no JVD, supple, symmetrical, trachea midline, and thyroid not enlarged, symmetric, no tenderness/mass/nodules Lymph nodes: Cervical, supraclavicular, and axillary nodes normal. Resp: rales LUL Back: symmetric, no curvature. ROM normal. No CVA tenderness. Cardio: regular rate and rhythm, S1, S2 normal, no murmur, click, rub or gallop GI: soft, non-tender; bowel sounds normal; no masses,  no organomegaly Extremities: extremities normal, atraumatic, no cyanosis or edema Neurologic: Alert and oriented X 3, normal strength and tone. Normal symmetric reflexes. Normal coordination and gait  ECOG PERFORMANCE STATUS: 1 - Symptomatic but completely ambulatory  Blood pressure (!) 116/55, pulse 94, temperature 97.9 F (36.6 C), temperature source Temporal, resp. rate (!) 22, weight 214 lb 4.8 oz (97.2 kg), SpO2 90 %.  LABORATORY DATA: Lab Results  Component Value Date   WBC 11.7 (H) 06/17/2022   HGB 9.7 (L) 06/17/2022   HCT 32.4 (L) 06/17/2022   MCV 85.9 06/17/2022   PLT 355 06/17/2022      Chemistry      Component Value Date/Time   NA 141 06/17/2022 1018   NA  145 (H) 04/19/2022 0944   K 3.7 06/17/2022 1018   CL 98 06/17/2022 1018   CO2 37 (H) 06/17/2022 1018   BUN 12 06/17/2022 1018   BUN 9 04/19/2022 0944   CREATININE 0.91 06/17/2022 1018   CREATININE 0.81 07/02/2016 1323      Component Value Date/Time   CALCIUM 9.9 06/17/2022 1018   ALKPHOS 69 06/17/2022 1018   AST 29 06/17/2022 1018   ALT 35 06/17/2022 1018   BILITOT 0.4 06/17/2022 1018       RADIOGRAPHIC STUDIES: CT Chest W Contrast  Result Date: 06/19/2022 CLINICAL DATA:  Non-small-cell lung cancer staging. Status post XRT and chemotherapy. Shortness of breath worse with exertion. * Tracking Code: BO * EXAM: CT chest WITH CONTRAST TECHNIQUE: Multidetector CT imaging of the chest was performed following the standard protocol during bolus administration of intravenous  contrast. RADIATION DOSE REDUCTION: This exam was performed according to the departmental dose-optimization program which includes automated exposure control, adjustment of the mA and/or kV according to patient size and/or use of iterative reconstruction technique. CONTRAST:  75mL OMNIPAQUE IOHEXOL 300 MG/ML  SOLN COMPARISON:  Chest CT 03/19/2022.  Older exams as well FINDINGS: CT CHEST FINDINGS Cardiovascular: Heart is nonenlarged. No pericardial effusion. Status post median sternotomy. The thoracic aorta has a normal course and caliber with scattered vascular calcifications. Right IJ chest port with tip along the central SVC. Mediastinum/Nodes: No specific abnormal lymph node enlargement identified in the axillary region, hilum or mediastinum. There is an exception of an enlarged right hilar node. On series 2, image 70 this measures 2.2 by 1.4 cm. On the prior this node would have measured 1.9 x 1.1 cm. Slightly larger today. There are some small subcarinal nodes which are not pathologically enlarged by size criteria. Lungs/Pleura: Centrilobular emphysematous lung changes are identified. The right lung has areas of interstitial septal thickening and some scattered peripheral ground-glass. No right-sided consolidation, pneumothorax or effusion. The left lung once again has volume loss with the extensive opacification. There are bronchograms seen along the left lower lobe. The levels of opacification and consolidation left lower lobe is slightly progressive. There is also some areas of pleural fluid tracking along the interlobar fissure as well, slightly increased from previous overall. Increasing interstitial changes along the residual aerated left upper lobe Upper abdomen: The adrenal glands are preserved in the upper abdomen. There is a mass lesion suggested along the left kidney. Previously this measured 3.6 x 2.8 cm and today lesion is not completely included in the imaging field but is larger at 5.0 x 3.4 cm.  There is also a lesion along the anterior aspect of the right kidney which may be new measuring 2.2 by 1.8 cm. Please correlate with the CT scan of the abdomen from 04/25/2022 describing bilateral renal masses. Musculoskeletal: Scattered degenerative changes along the spine. IMPRESSION: Increasing right hilar lymph node. There are some small nodes elsewhere in the mediastinum in addition which are not pathologic by size criteria but slightly more prominent than on prior. Increasing left-sided pleural effusion and consolidative lung opacity. Additional areas of interstitial septal thickening ground-glass. Increasing mass lesions at the edge of the imaging field along the kidneys. Please correlate with more recent CT of the abdomen from 04/25/2022. Electronically Signed   By: Karen Kays M.D.   On: 06/19/2022 15:36    ASSESSMENT AND PLAN: This is a very pleasant 77 years old white female with Stage IIb/IIIa (T3, N0/N2, M0) non-small cell lung cancer, squamous cell carcinoma  presented with left hilar mass with suspicious mediastinal lymphadenopathy on the last CT scan of the chest at [facility]. She also has a history of stage Ia non-small cell lung cancer, adenocarcinoma status post surgical resection in July 2012 as well as recurrent stage Ia non-small cell lung cancer, squamous cell carcinoma status post SBRT completed January 30, 2021. The patient completed a course of concurrent chemoradiation with weekly carboplatin for AUC of 2 and paclitaxel 45 Mg/M2 status post 6 cycles.   She underwent consolidation treatment with immunotherapy with Imfinzi 1500 Mg IV every 4 weeks.  Status post 4 cycle started on 8/31 2023. The patient has been tolerating this treatment well except for the recent admission to the hospital with worsening dyspnea that was suspicious to be secondary to either pulmonary edema versus infection versus drug-induced pneumonitis from amiodarone or Imfinzi. Her treatment with amiodarone  was discontinued. The patient was also treated with a tapered dose of prednisone over several weeks.  The patient is currently on observation and she continues to have significant shortness of breath secondary to her COPD. She had repeat CT scan of the chest performed recently.  I personally and independently reviewed the scan images and discussed the results with the patient and her husband. Her scan showed increasing right hilar lymph node.  There was increasing left-sided pleural effusion and consolidative lung opacity and additional 2 areas of interstitial septal thickening and groundglass. I recommended for the patient to have a PET scan for further evaluation of her disease and to rule out any disease recurrence or progression. I will see her back for follow-up visit in 3 weeks for evaluation and discussion of her PET scan results and further recommendation regarding her condition. For the COPD, she is followed by Dr. Sherene Sires. For the suspicious renal mass she was referred to urology. The patient was advised to call immediately if she has any other concerning symptoms in the interval.  The patient voices understanding of current disease status and treatment options and is in agreement with the current care plan.  All questions were answered. The patient knows to call the clinic with any problems, questions or concerns. We can certainly see the patient much sooner if necessary.  The total time spent in the appointment was 30 minutes.  Disclaimer: This note was dictated with voice recognition software. Similar sounding words can inadvertently be transcribed and may not be corrected upon review.

## 2022-06-20 NOTE — Assessment & Plan Note (Signed)
Stable. Continue regular visits with cardiology as scheduled.  

## 2022-06-20 NOTE — Assessment & Plan Note (Signed)
Treat with augmentin 875 mg twice daily for  7 days. Cbc to be recheck during next visit with oncology.

## 2022-06-21 ENCOUNTER — Telehealth: Payer: Self-pay | Admitting: Internal Medicine

## 2022-06-26 DIAGNOSIS — J9601 Acute respiratory failure with hypoxia: Secondary | ICD-10-CM | POA: Diagnosis not present

## 2022-07-01 NOTE — Progress Notes (Unsigned)
[name], female    DOB: [birth year]   MRN: [id number]   Brief patient profile:  76 yowf quit smoking 07/19/21 s/p LULobectomy 2012 stage 1A NSC s adjuvant rx  temporarily quit smoking 03/21/20 with admit to [facility] / acute resp failure:   05/05/2020 Discharge Date:  05/22/2020 Consultants: Pulmonology, GI, wound care, cardiology  Principal Problem: Acute respiratory failure with hypoxia and hypercapnia Active Problems: Influenza A Type 2 MI (myocardial infarction) Metabolic encephalopathy Acute systolic congestive heart failure Coronary artery disease involving native coronary artery of native heart Dilated cardiomyopathy Acute blood loss anemia COPD with acute exacerbation Resolved Problems: Acute respiratory failure with hypercapnia  Hospital Course: [name] is a 77 y.o. female who presented with complaints of shortness of breath and altered mental status. Was found to have acute hypercapnic respiratory failure was placed on BiPAP in the ER. Patient had been tested positive for the flu. Patient required intubation and was admitted to ICU. Critical care was consulted. She was started on Tamiflu for influenza. Treatment for diabetes and hypothyroidism continued. She was found to have acute on chronic systolic heart failure as well. Patient has a cardiologist [place] where she was from where she will need to follow-up at discharge. Patient had came here on a trip to [place] from [place] when this started happening. She was found to be in atrial fibrillation and placed on amiodarone and beta-blocker temporarily. Though these were held briefly during the times when she had low blood pressures. Patient also was found to have anemia which required transfusion. Has anemia was found to be iron deficiency. GI saw the patient and felt endoscopic evaluation would be good when she is medically optimized. Patient was stabilized and transferred to the floor. Her mental  status had improved very slowly her oxygenation requirements have improved as well. She has history of lung cancer and lobectomy in the past and she continued to smoke prior to this admission. She is advised to stop smoking and she will need to follow-up with pulmonology outpatient. She has improved significantly now after prolonged course. At this point, she is on 3 L nasal cannula and has been set up for home O2. Patient would like to be discharged and will be discharged home in improved condition to follow-up closely with PCP cardiology and GI and pulmonology in the near future   additional hx:  MI in 2012 and underwent emergent CABG.  She was noted to have a left upper lobe lung mass.  She had a left upper lobectomy a couple of months after her CABG.  The lung lesion turned out to be a stage Ia non-small cell carcinoma.  She did not require any adjuvant therapy     History of Present Illness  06/29/2020  Pulmonary/ 1st office eval/Connor Meacham  Chief Complaint  Patient presents with   Pulmonary Consult     Referred by Vincent Gros, NP. Pt with hx of left VATS- Dr Dorris Fetch 2012. She states hospitalized in March 2022 with PNA, influenza and UTI. She states remote hx of taking Amiodarone.   Dyspnea:  Strolling ok / can do [employer]/ no HC parking  Cough: none  Sleep: bed is flat / on side  SABA use: prn not using  Rec Make sure you check your oxygen saturation at your highest level of activity to be sure it stays over 90% and keep track of it at least once a week, more often if breathing getting worse, and let me know  if losing ground.  Add:  V/q and venous doppler ordered 06/30/20 > neg   09/11/2020  f/u ov/Amorie Rentz re:  doe/ 02 dep resp failure s/p Influenza A 04/2020 in [place] on way back from [place]  - has resumed smoking since last ov per husband though she downplays this Chief Complaint  Patient presents with   Follow-up    Patient is here for PFT review and does not have concerns with her  breathing.  Dyspnea:  still does shopping at [employer] and [employer] not wearing 02  Cough: none  Sleeping: flat bed / one pillow / ends up in recliner due to toss and turning  SABA use: not easing  02: sleeps on 3 lpm  Covid status:   no vaccination Rec Make sure you check your oxygen saturation  at your highest level of activity  to be sure it stays over 88% and adjust  02 flow upward to maintain this level if needed but remember to turn it back to previous settings when you stop (to conserve your supply).  Let me know if ADAPT is not helpful with your travel plans or your equipment    03/27/2021  f/u ov/Madeline Pho re: GOLD 3   maint on no inhalers/ 02 hs and prn daytime   Chief Complaint  Patient presents with   Follow-up    Breathing is unchanged. She has prod cough with clear sputum in the am.   Dyspnea:  pushing cart [employer]/ uses HC parking s 02 Cough: rattle worse in ams > clear mucus  Sleeping: freq in recliner but starts in bed 45 degree no change pattern over last sev years  SABA use: not using but does have it  02: 2lpm hs / does not check with ex  Covid status:   never vax   Rec Only use your albuterol as a rescue medication  Ok to try albuterol 15 min before an activity (on alternating days)  that you know would usually make you short of breath  Make sure you check your oxygen saturation  AT  your highest level of activity (not after you stop)   to be sure it stays over 90%  Seen by NP 03/20/22 with following summary    She has a history of left upper lobe adenocarcinoma post lobectomy 2012.  Found to have an enlarging left lower lobe nodule in 2022 and underwent SBRT with resolution.  In May 2023, she was found to have a hypermetabolic large left hilar mass with associated left-sided volume loss.  She underwent bronchoscopy with Dr. Dorris Fetch on 6/9 and was diagnosed with recurrent non-small cell cancer.  The following day she was admitted to [facility] for leg swelling and shortness of  breath.  She was found to have a large left-sided pleural effusion which chest tube was placed for.  Effusion did not resolve despite drainage and diuresis.  She was started on steroids and discharged with plans to see radiation oncology the same day.  Radiation was started 6/15 and was repeated daily with plans to start chemotherapy in near future.  She presented to radiation therapy on 6/20 and was noted to be tachycardic-sent to [facility] long ED and found to be in SVT.  CXR during the stay was also significant for complete opacification of the left hemithorax; no intervention.  She was treated with IV metoprolol and discharged home.  She went back to the ED on 6/21 with palpitations and hot flashes.  She was found to be in  SVT again and given 2 doses of adenosine, which was unsuccessful.  She then received IV beta-blocker with rate control and A-fib was seen; cardiology consulted.   During her hospital stay in June 2023, a repeat CT scan showed full left lung atelectasis, proximal left hilar mass and pleural fluid with suspected partial loculations.  PCCM was consulted to discuss possible intervention.  It was decided that there was no indication for chest tube placement as she did not have any previous improvement with this due to proximal obstruction.  There was also no role for VATS decortication.  Recommended waiting until she completed radiation and started chemotherapy to reevaluate to see if proximal obstruction had resolved/improved and if she would be a candidate for VATS or Pleurx catheter. She was discharged on 6/24 with plans to follow up outpatient.    She went back to the hospital 01/28/2022 with increasing oxygen requirements and was admitted for acute on chronic respiratory failure secondary to drug induced pneumonitis, possibly related to amiodarone and immunotherapy Imfinzi. Question possible pneumonia as well - treated with rocephin and azithromycin. She was treated with high dose steroids  and transitioned to prolonged taper upon discharge as well as Bactim for PJP prophylaxis  05/06/2022 re-established  ov/Devante Capano re: GOLD 3  maint on brovana  /budesonide/yupelri  last pred x months prior to ov  Chief Complaint  Patient presents with   Follow-up    Doing well.  SaO2 dropping with exertion as well as low activity. Drops to Low 80's  Dyspnea:  housebound/ room on 4.5 drops across the house down to 80%  Cough: minimal Sleeping: flat bed on side one pillow / fine prone  SABA use: none  02: 4.5 lpm not titrating Rec Please remember to go to the lab department   for your tests - we will call you with the results when they are available. Make sure you check your oxygen saturation  AT  your highest level of activity (not after you stop)   to be sure it stays over 90%   07/03/2022  f/u ov/Casyn Becvar re: ***   maint on ***  No chief complaint on file.   Dyspnea:  *** Cough: *** Sleeping: *** SABA use: *** 02: *** Covid status:   *** Lung cancer screening :  ***    No obvious day to day or daytime variability or assoc excess/ purulent sputum or mucus plugs or hemoptysis or cp or chest tightness, subjective wheeze or overt sinus or hb symptoms.   *** without nocturnal  or early am exacerbation  of respiratory  c/o's or need for noct saba. Also denies any obvious fluctuation of symptoms with weather or environmental changes or other aggravating or alleviating factors except as outlined above   No unusual exposure hx or h/o childhood pna/ asthma or knowledge of premature birth.  Current Allergies, Complete Past Medical History, Past Surgical History, Family History, and Social History were reviewed in [employer] record.  ROS  The following are not active complaints unless bolded Hoarseness, sore throat, dysphagia, dental problems, itching, sneezing,  nasal congestion or discharge of excess mucus or purulent secretions, ear ache,   fever, chills, sweats, unintended  wt loss or wt gain, classically pleuritic or exertional cp,  orthopnea pnd or arm/hand swelling  or leg swelling, presyncope, palpitations, abdominal pain, anorexia, nausea, vomiting, diarrhea  or change in bowel habits or change in bladder habits, change in stools or change in urine, dysuria, hematuria,  rash, arthralgias,  visual complaints, headache, numbness, weakness or ataxia or problems with walking or coordination,  change in mood or  memory.        No outpatient medications have been marked as taking for the 07/03/22 encounter (Appointment) with Nyoka Cowden, MD.            Past Medical History:  Diagnosis Date   CAD (coronary artery disease)    Cancer (HCC)    Dyslipidemia    GERD (gastroesophageal reflux disease)    HTN (hypertension)    Myocardial infarct (HCC)    Non-small cell lung cancer (HCC) 08/2010   Stage IA, status post left upper lobectomy July 2012   Tobacco abuse       Objective:    wts   07/03/2022       *** 05/06/2022       215  03/27/2021         213   09/11/20 214 lb 12.8 oz (97.4 kg)  07/27/20 213 lb 3.2 oz (96.7 kg)  07/24/20 213 lb 9.6 oz (96.9 kg)     Vital signs reviewed  07/03/2022  - Note at rest 02 sats  ***% on ***   General appearance:    ***      Min barr  1+ sym ankle pitting edema ***            I personally reviewed images and agree with radiology impression as follows:   Chest CTcutes on abd study 3/7/4    Similar partially imaged left lower lobe consolidation and diffuse right basilar reticular opacities. Similar chronic left pleural effusion/thickening.       Lab Results  Component Value Date   ESRSEDRATE 22 05/06/2022   ESRSEDRATE 81 (H) 06/29/2020       Assessment

## 2022-07-03 ENCOUNTER — Ambulatory Visit: Payer: Medicare HMO | Admitting: Internal Medicine

## 2022-07-03 ENCOUNTER — Encounter: Payer: Self-pay | Admitting: Internal Medicine

## 2022-07-03 VITALS — BP 118/74 | HR 87 | Temp 99.0°F | Ht 64.0 in | Wt 211.6 lb

## 2022-07-03 DIAGNOSIS — J9612 Chronic respiratory failure with hypercapnia: Secondary | ICD-10-CM

## 2022-07-03 DIAGNOSIS — J449 Chronic obstructive pulmonary disease, unspecified: Secondary | ICD-10-CM

## 2022-07-03 DIAGNOSIS — J984 Other disorders of lung: Secondary | ICD-10-CM | POA: Diagnosis not present

## 2022-07-03 DIAGNOSIS — J9611 Chronic respiratory failure with hypoxia: Secondary | ICD-10-CM | POA: Diagnosis not present

## 2022-07-03 DIAGNOSIS — T50905A Adverse effect of unspecified drugs, medicaments and biological substances, initial encounter: Secondary | ICD-10-CM

## 2022-07-03 NOTE — Patient Instructions (Signed)
Try just using nebulizer in AM with the second dose optional   We will walk you today to show you how to adjust your 02 to keep your saturations above 90%   Please schedule a follow up visit in 3 months but call sooner if needed

## 2022-07-04 NOTE — Assessment & Plan Note (Signed)
Quit smoking 03/21/20  - Spirometry 05/18/15   FEV1 0.8 (39%)  Ratio 0.56    Group D (now reclassified as E) in terms of symptom/risk and laba/lama/ICS  therefore appropriate rx at this point >>>  neb triple rx - prob ok to just use in am unless worse symptoms in pm or noct

## 2022-07-04 NOTE — Assessment & Plan Note (Signed)
HC03   06/29/2020  = 36 -  06/29/2020   Walked RA  2 laps @ approx 238ft each @ relatively pace  stopped due to end of study,  no sob but sats 86%   - HC03  04/19/22  = 36   Patient was only able to complete 250 ft at a slow, guarded pace. She was only able to complete 125 ft at 3L before her O2 dropped to 83%.   increased her O2 to 4L, she recovered to 96%. She ended the walk on 4L of O2 at 96%   Again advised: Make sure you check your oxygen saturation  AT  your highest level of activity (not after you stop)   to be sure it stays over 90% and adjust  02 flow upward to maintain this level if needed but remember to turn it back to previous settings when you stop (to conserve your supply).    Each maintenance medication was reviewed in detail including emphasizing most importantly the difference between maintenance and prns and under what circumstances the prns are to be triggered using an action plan format where appropriate.  Total time for H and P, chart review, counseling, reviewing neb/02 device(s) , directly observing portions of ambulatory 02 saturation study/ and generating customized AVS unique to this office visit / same day charting = 25 min

## 2022-07-04 NOTE — Assessment & Plan Note (Signed)
See admit 01/28/22 amio vs imfinzi suspected, both stopped and rx steroids off by end of Jan 2024  Unfortunately ddx also includes recurrent ca at this point > defer decision re repeat TBBx to oncology / probably no need for further prednisone at this point

## 2022-07-07 ENCOUNTER — Other Ambulatory Visit: Payer: Self-pay | Admitting: Nurse Practitioner

## 2022-07-07 DIAGNOSIS — E039 Hypothyroidism, unspecified: Secondary | ICD-10-CM

## 2022-07-08 NOTE — Telephone Encounter (Signed)
I went ahead and filled this. Can you make sure she schedules a follow up. Would be great if this can be done prior to me leaving. Also, she needs to be last in the morning or afternoon. Please and thank you

## 2022-07-18 NOTE — Addendum Note (Signed)
Addended by: Vincent Gros on: 07/18/2022 06:22 AM   Modules accepted: Level of Service

## 2022-07-25 ENCOUNTER — Encounter (HOSPITAL_COMMUNITY)
Admission: RE | Admit: 2022-07-25 | Discharge: 2022-07-25 | Disposition: A | Discharge status: Home / Self Care | Payer: Medicare HMO | Source: Ambulatory Visit | Attending: Internal Medicine | Admitting: Internal Medicine

## 2022-07-25 DIAGNOSIS — C3490 Malignant neoplasm of unspecified part of unspecified bronchus or lung: Secondary | ICD-10-CM | POA: Insufficient documentation

## 2022-07-25 LAB — GLUCOSE, CAPILLARY: Glucose-Capillary: 100 mg/dL — ABNORMAL HIGH (ref 70–99)

## 2022-07-25 MED ORDER — FLUDEOXYGLUCOSE F - 18 (FDG) INJECTION
10.5600 | Freq: Once | INTRAVENOUS | Status: AC
  Administered 2022-07-25: 10.56 via INTRAVENOUS

## 2022-07-26 ENCOUNTER — Encounter: Payer: Self-pay | Admitting: Nurse Practitioner

## 2022-07-26 ENCOUNTER — Ambulatory Visit (INDEPENDENT_AMBULATORY_CARE_PROVIDER_SITE_OTHER): Payer: Medicare HMO | Admitting: Nurse Practitioner

## 2022-07-26 VITALS — BP 118/68 | HR 83 | Ht 64.0 in | Wt 208.1 lb

## 2022-07-26 DIAGNOSIS — Z9981 Dependence on supplemental oxygen: Secondary | ICD-10-CM

## 2022-07-26 DIAGNOSIS — I10 Essential (primary) hypertension: Secondary | ICD-10-CM

## 2022-07-26 DIAGNOSIS — N2889 Other specified disorders of kidney and ureter: Secondary | ICD-10-CM

## 2022-07-26 DIAGNOSIS — C3412 Malignant neoplasm of upper lobe, left bronchus or lung: Secondary | ICD-10-CM | POA: Diagnosis not present

## 2022-07-26 DIAGNOSIS — I480 Paroxysmal atrial fibrillation: Secondary | ICD-10-CM

## 2022-07-26 NOTE — Progress Notes (Signed)
Established patient visit   Patient: [name]   DOB: 1945-12-06   77 y.o. Female  MRN: [id number] Visit Date: 07/26/2022   Chief Complaint  Patient presents with   Medical Management of Chronic Issues   Subjective    HPI  Follow up  HTN  -bp slightly elevated today  Lower back pain  -able to take alever with improvement  Malignant neoplasm of lung.,  -growing mass on kidney --PET scan yesterday - results are not available.  -new dx a-fib --not currently on medication  --had severe negative reaction from taking amiodarone and immunotherapy together.   -no changes or new symptoms today    Medications: Outpatient Medications Prior to Visit  Medication Sig   acetaminophen (TYLENOL) 500 MG tablet Take 500 mg by mouth every 6 (six) hours as needed for moderate pain or fever.   arformoterol (BROVANA) 15 MCG/2ML NEBU Take 2 mLs (15 mcg total) by nebulization 2 (two) times daily. (Patient taking differently: Take 15 mcg by nebulization daily.)   aspirin 81 MG tablet Take 81 mg by mouth at bedtime.   budesonide (PULMICORT) 0.5 MG/2ML nebulizer solution Take 2 mLs (0.5 mg total) by nebulization 2 (two) times daily. (Patient taking differently: Take 0.5 mg by nebulization daily.)   Coenzyme Q10 (CO Q 10 PO) Take 1 capsule by mouth daily.   levothyroxine (SYNTHROID) 50 MCG tablet TAKE 1 TABLET BY MOUTH DAILY ALONG WITH 12.5 MCG EXCEPT ON SUNDAY TAKE 50 MCG   lidocaine-prilocaine (EMLA) cream Apply 1 Application topically as needed. (Patient taking differently: Apply 1 Application topically as needed (pain).)   naproxen sodium (ALEVE) 220 MG tablet Take 220 mg by mouth 2 (two) times daily as needed (pain).   Omega-3 Fatty Acids (OMEGA-3 PO) Take 1,000 mg by mouth in the morning and at bedtime.   rosuvastatin (CRESTOR) 40 MG tablet Take 1 tablet (40 mg total) by mouth daily.   [DISCONTINUED] furosemide (LASIX) 20 MG tablet Take 1 tablet (20 mg total) by mouth daily.    [DISCONTINUED] KLOR-CON M10 10 MEQ tablet TAKE 1 TABLET BY MOUTH EVERY DAY (Patient taking differently: Take 10 mEq by mouth every other day.)   [DISCONTINUED] levothyroxine (SYNTHROID) 25 MCG tablet TAKE 0.5 TABLETS (12.5 MCG TOTAL) BY MOUTH DAILY. IN ADDITION TO THE 50 MCG.   [DISCONTINUED] metoprolol succinate (TOPROL-XL) 100 MG 24 hr tablet Take 0.5 tablets (50 mg total) by mouth daily. Take with or immediately following a meal.   [DISCONTINUED] YUPELRI 175 MCG/3ML nebulizer solution INHALE VIA NEBULIZER DAILY (Patient taking differently: daily.)   No facility-administered medications prior to visit.    Review of Systems See HPI    Last CBC Lab Results  Component Value Date   WBC 11.7 (H) 06/17/2022   HGB 9.7 (L) 06/17/2022   HCT 32.4 (L) 06/17/2022   MCV 85.9 06/17/2022   MCH 25.7 (L) 06/17/2022   RDW 17.6 (H) 06/17/2022   PLT 355 06/17/2022   Last metabolic panel Lab Results  Component Value Date   GLUCOSE 100 (H) 06/17/2022   NA 141 06/17/2022   K 3.7 06/17/2022   CL 98 06/17/2022   CO2 37 (H) 06/17/2022   BUN 12 06/17/2022   CREATININE 0.91 06/17/2022   GFRNONAA >60 06/17/2022   CALCIUM 9.9 06/17/2022   PHOS 4.1 02/02/2022   PROT 7.1 06/17/2022   ALBUMIN 3.4 (L) 06/17/2022   LABGLOB 2.5 04/19/2022   AGRATIO 1.4 04/19/2022   BILITOT 0.4 06/17/2022   ALKPHOS 69  06/17/2022   AST 29 06/17/2022   ALT 35 06/17/2022   ANIONGAP 6 06/17/2022   Last lipids Lab Results  Component Value Date   CHOL 99 (L) 04/19/2022   HDL 43 04/19/2022   LDLCALC 37 04/19/2022   TRIG 100 04/19/2022   CHOLHDL 2.3 04/19/2022   Last hemoglobin A1c Lab Results  Component Value Date   HGBA1C 5.8 (H) 04/19/2022   Last thyroid functions Lab Results  Component Value Date   TSH 3.011 06/17/2022   T3TOTAL 131 08/18/2018   T4TOTAL 9.6 10/18/2021   Last vitamin D Lab Results  Component Value Date   VD25OH 42.2 10/25/2020       Objective     Today's Vitals   07/26/22  1124 07/26/22 1211  BP: 138/85 118/68  Pulse: 83   SpO2: 92%   Weight: 208 lb 1.9 oz (94.4 kg)   Height: 5\' 4"  (1.626 m)    Body mass index is 35.72 kg/m.  BP Readings from Last 3 Encounters:  08/09/22 (Abnormal) 100/54  08/07/22 122/68  07/29/22 102/66    Wt Readings from Last 3 Encounters:  08/09/22 205 lb 12.8 oz (93.4 kg)  08/07/22 204 lb (92.5 kg)  07/29/22 207 lb (93.9 kg)    Physical Exam Vitals and nursing note reviewed.  Constitutional:      Appearance: Normal appearance. She is well-developed. She is obese. She is not ill-appearing.  HENT:     Head: Normocephalic and atraumatic.     Nose: Nose normal.     Mouth/Throat:     Mouth: Mucous membranes are moist.     Pharynx: Oropharynx is clear.  Eyes:     Extraocular Movements: Extraocular movements intact.     Conjunctiva/sclera: Conjunctivae normal.     Pupils: Pupils are equal, round, and reactive to light.  Neck:     Comments: JVD present on left side of neck  Cardiovascular:     Rate and Rhythm: Normal rate and regular rhythm.     Pulses: Normal pulses.     Heart sounds: Normal heart sounds.  Pulmonary:     Effort: Pulmonary effort is normal.     Breath sounds: Wheezing and rhonchi present.     Comments: Oxygen saturation continues to iprove.  They are now 92%. She is currently using 3-4 lpm of nasal cannula oxygen . Abdominal:     Palpations: Abdomen is soft.  Musculoskeletal:        General: Normal range of motion.     Cervical back: Normal range of motion and neck supple.  Lymphadenopathy:     Cervical: No cervical adenopathy.  Skin:    General: Skin is warm and dry.     Capillary Refill: Capillary refill takes less than 2 seconds.  Neurological:     General: No focal deficit present.     Mental Status: She is alert and oriented to person, place, and time. Mental status is at baseline.  Psychiatric:        Mood and Affect: Mood normal.        Behavior: Behavior normal.        Thought Content:  Thought content normal.        Judgment: Judgment normal.       Assessment & Plan    Primary hypertension Assessment & Plan: Blood pressure improved and stable.  -continue medication as prescribed  -reassess in 3 months    Paroxysmal atrial fibrillation with RVR (HCC) Assessment & Plan: Regular heart rate  and rhythm  -follow up with cardiology as scheduled   Malignant neoplasm of upper lobe of left lung [facility]) Assessment & Plan: Patient followed by oncology -PET scan done yesterday, though results are not yet available.    Supplemental oxygen dependent Assessment & Plan: Continue nasal cannula oxygen use as prescribed.    Mass of left kidney Assessment & Plan: Mass of left kidney growing.  -PET scan yesterday.  -discuss with oncology at next visit.       Return in about 3 months (around 10/26/2022) for mood, blood pressure 3-4 months. needs MWV whenever possible.         [name], NP  [facility] [phone] (phone) [phone] (fax)  [facility]

## 2022-07-27 DIAGNOSIS — J9601 Acute respiratory failure with hypoxia: Secondary | ICD-10-CM | POA: Diagnosis not present

## 2022-07-29 ENCOUNTER — Telehealth: Payer: Self-pay | Admitting: Medical Oncology

## 2022-07-29 ENCOUNTER — Encounter: Payer: Self-pay | Admitting: Nurse Practitioner

## 2022-07-29 ENCOUNTER — Telehealth: Payer: Self-pay | Admitting: Pulmonary Disease

## 2022-07-29 ENCOUNTER — Inpatient Hospital Stay: Payer: Medicare HMO | Attending: Internal Medicine | Admitting: Internal Medicine

## 2022-07-29 ENCOUNTER — Other Ambulatory Visit: Payer: Self-pay

## 2022-07-29 ENCOUNTER — Other Ambulatory Visit: Payer: Self-pay | Admitting: Nurse Practitioner

## 2022-07-29 VITALS — BP 102/66 | HR 91 | Temp 98.9°F | Resp 18 | Ht 64.0 in | Wt 207.0 lb

## 2022-07-29 DIAGNOSIS — R509 Fever, unspecified: Secondary | ICD-10-CM | POA: Diagnosis not present

## 2022-07-29 DIAGNOSIS — Z923 Personal history of irradiation: Secondary | ICD-10-CM | POA: Diagnosis not present

## 2022-07-29 DIAGNOSIS — R600 Localized edema: Secondary | ICD-10-CM

## 2022-07-29 DIAGNOSIS — C3492 Malignant neoplasm of unspecified part of left bronchus or lung: Secondary | ICD-10-CM | POA: Insufficient documentation

## 2022-07-29 DIAGNOSIS — E876 Hypokalemia: Secondary | ICD-10-CM

## 2022-07-29 DIAGNOSIS — J90 Pleural effusion, not elsewhere classified: Secondary | ICD-10-CM | POA: Insufficient documentation

## 2022-07-29 DIAGNOSIS — E039 Hypothyroidism, unspecified: Secondary | ICD-10-CM

## 2022-07-29 MED ORDER — FUROSEMIDE 20 MG PO TABS
20.0000 mg | ORAL_TABLET | Freq: Every day | ORAL | 1 refills | Status: DC
Start: 2022-07-29 — End: 2022-08-29

## 2022-07-29 MED ORDER — POTASSIUM CHLORIDE CRYS ER 10 MEQ PO TBCR
10.0000 meq | EXTENDED_RELEASE_TABLET | Freq: Every day | ORAL | 1 refills | Status: DC
Start: 2022-07-29 — End: 2022-08-29

## 2022-07-29 MED ORDER — LEVOTHYROXINE SODIUM 25 MCG PO TABS
12.5000 ug | ORAL_TABLET | Freq: Every day | ORAL | 1 refills | Status: DC
Start: 2022-07-29 — End: 2022-08-29

## 2022-07-29 MED ORDER — AMOXICILLIN-POT CLAVULANATE 875-125 MG PO TABS: 1.0000 | ORAL_TABLET | Freq: Two times a day (BID) | ORAL | 0 refills | Status: DC

## 2022-07-29 NOTE — Telephone Encounter (Signed)
Faxed PET scan results and recent note.

## 2022-07-29 NOTE — Telephone Encounter (Signed)
Per Dr. Tonia Brooms, pt needs to see SG to discuss bronch. Maralyn Sago has an opening on 6/21. Called and left VM for pt to call back to schedule OV.

## 2022-07-29 NOTE — Telephone Encounter (Signed)
I set appt as directed. PT had questions about the procedure. Please call @ [phone].

## 2022-07-29 NOTE — Telephone Encounter (Signed)
I LVM that she has an appt on June 19th  @ 1130 with Dr. Marcelle Overlie.

## 2022-07-29 NOTE — Telephone Encounter (Signed)
Called and spoke to pt. Pt states she wants to leave for [facility] for the fourth of July. Patient had questions about what activities she would be able to do and if she would be able to travel by the 4th if she had a bronch. Advised pt that Maralyn Sago will review this information with her at the time of the appointment based on when the bronch is scheduled. Will forward to Sarah and Dr. Tonia Brooms as an Lorain Childes. Pt has been scheduled for 6/21.

## 2022-07-29 NOTE — Progress Notes (Signed)
[facility] [phone]   [phone]  OFFICE PROGRESS NOTE  [name], NP [address] [place] [address]  DIAGNOSIS: Likely metastatic non-small cell lung cancer initially diagnosed as stage IIIA (T3, N2, M0) non-small cell lung cancer, squamous cell carcinoma presented with left hilar mass with suspicious mediastinal lymphadenopathy on the last CT scan of the chest at [facility] health.  PRIOR THERAPY:  1) A course of concurrent chemoradiation with weekly carboplatin for AUC of 2 and paclitaxel 45 Mg/M2.  Status post 6 cycles. 2) Consolidation treatment with immunotherapy with Imfinzi 1500 Mg IV every 4 weeks.  First dose October 18, 2021.  Status post 4 cycles.  Last dose was given on January 14, 2022 discontinued secondary to suspicious immunotherapy mediated pneumonitis  CURRENT THERAPY: Observation.  INTERVAL HISTORY: [name] 77 y.o. female returns to the clinic today for follow-up visit accompanied by her husband and daughter.  The patient continues to have the baseline shortness of breath and currently on home oxygen.  She also has mild cough with no significant chest pain or hemoptysis.  She had low-grade fever the last few days.  She denied having any current nausea, vomiting, diarrhea or constipation.  She has no headache or visual changes.  She has no recent weight loss or night sweats.  She was found on previous CT scan of the chest to have increasing right hilar lymph node in addition to increasing left-sided pleural effusion and consolidation.  The patient had a PET scan performed recently and she is here for evaluation and discussion of her scan results and further recommendation regarding her condition.  MEDICAL HISTORY: Past Medical History:  Diagnosis Date   CAD (coronary artery disease)    Cancer (HCC)    COPD (chronic obstructive pulmonary disease) (HCC)    per DM note, pt denies   Dyslipidemia     GERD (gastroesophageal reflux disease)    History of radiation therapy    Left lung- 01/16/21-01/30/21- Dr. Antony Blackbird   History of radiation therapy    Left lung- 08/02/21-09/18/21- Dr. Antony Blackbird   HTN (hypertension)    Hypothyroidism    Myocardial infarct [facility])    Non-small cell lung cancer (HCC) 08/19/2010   Stage IA, status post left upper lobectomy July 2012   Tobacco abuse     ALLERGIES:  is allergic to amiodarone and codeine.  MEDICATIONS:  Current Outpatient Medications  Medication Sig Dispense Refill   acetaminophen (TYLENOL) 500 MG tablet Take 500 mg by mouth every 6 (six) hours as needed for moderate pain or fever.     arformoterol (BROVANA) 15 MCG/2ML NEBU Take 2 mLs (15 mcg total) by nebulization 2 (two) times daily. 120 mL 11   aspirin 81 MG tablet Take 81 mg by mouth at bedtime.     budesonide (PULMICORT) 0.5 MG/2ML nebulizer solution Take 2 mLs (0.5 mg total) by nebulization 2 (two) times daily. 120 mL 11   Coenzyme Q10 (CO Q 10 PO) Take 1 capsule by mouth daily.     furosemide (LASIX) 20 MG tablet Take 1 tablet (20 mg total) by mouth daily. 90 tablet 1   levothyroxine (SYNTHROID) 25 MCG tablet Take 0.5 tablets (12.5 mcg total) by mouth daily. In addition to the 50 mcg. 45 tablet 1   levothyroxine (SYNTHROID) 50 MCG tablet TAKE 1 TABLET BY MOUTH DAILY ALONG WITH 12.5 MCG EXCEPT ON SUNDAY TAKE 50 MCG 90 tablet 1  lidocaine-prilocaine (EMLA) cream Apply 1 Application topically as needed. (Patient taking differently: Apply 1 Application topically as needed (pain).) 30 g 2   metoprolol succinate (TOPROL-XL) 100 MG 24 hr tablet Take 0.5 tablets (50 mg total) by mouth daily. Take with or immediately following a meal. 90 tablet 1   naproxen sodium (ALEVE) 220 MG tablet Take 220 mg by mouth 2 (two) times daily as needed (pain).     Omega-3 Fatty Acids (OMEGA-3 PO) Take 1,000 mg by mouth in the morning and at bedtime.     potassium chloride (KLOR-CON M10) 10 MEQ tablet Take  1 tablet (10 mEq total) by mouth daily. 90 tablet 1   rosuvastatin (CRESTOR) 40 MG tablet Take 1 tablet (40 mg total) by mouth daily. 90 tablet 3   YUPELRI 175 MCG/3ML nebulizer solution INHALE VIA NEBULIZER DAILY 90 mL 1   No current facility-administered medications for this visit.    SURGICAL HISTORY:  Past Surgical History:  Procedure Laterality Date   CARDIAC CATHETERIZATION  07/06/2010   see CABG report - pt sent to OR   CARDIOVASCULAR STRESS TEST  09/11/2010   R/S MV - normal perfusion in all regions, EF 46%, no scintigraphic evidence of inducible myocardial ischemia; global LV systolic function mildly reduced; no significant wall motion abnormalities noted; Exercise capacity 7 METS; EKG negative for ischemia; low risk study, no signifcant change from previous study 08/2003   CORONARY ARTERY BYPASS GRAFT  07/11/2010   LIMA to LAD; SVG to 2nd branch OM; SVG to posterior descending artery   IR IMAGING GUIDED PORT INSERTION  08/16/2021   LEFT VATS  09/17/2010   TEE WITHOUT CARDIOVERSION  07/06/2010   during emergent CABG surgery; 2-3+ mitral regurgitation   VIDEO BRONCHOSCOPY N/A 07/27/2021   Procedure: VIDEO BRONCHOSCOPY;  Surgeon: Loreli Slot, MD;  Location: [facility] OR;  Service: Thoracic;  Laterality: N/A;   VIDEO BRONCHOSCOPY WITH ENDOBRONCHIAL ULTRASOUND N/A 07/27/2021   Procedure: VIDEO BRONCHOSCOPY WITH ENDOBRONCHIAL ULTRASOUND;  Surgeon: Loreli Slot, MD;  Location: [facility] OR;  Service: Thoracic;  Laterality: N/A;    REVIEW OF SYSTEMS:  Constitutional: positive for fatigue, fevers, and night sweats Eyes: negative Ears, nose, mouth, throat, and face: negative Respiratory: positive for cough and dyspnea on exertion Cardiovascular: negative Gastrointestinal: negative Genitourinary:negative Integument/breast: negative Hematologic/lymphatic: negative Musculoskeletal:positive for muscle weakness Neurological: negative Behavioral/Psych: negative Endocrine:  negative Allergic/Immunologic: negative   PHYSICAL EXAMINATION: General appearance: alert, cooperative, fatigued, and no distress Head: Normocephalic, without obvious abnormality, atraumatic Neck: no adenopathy, no JVD, supple, symmetrical, trachea midline, and thyroid not enlarged, symmetric, no tenderness/mass/nodules Lymph nodes: Cervical, supraclavicular, and axillary nodes normal. Resp: rales LUL Back: symmetric, no curvature. ROM normal. No CVA tenderness. Cardio: regular rate and rhythm, S1, S2 normal, no murmur, click, rub or gallop GI: soft, non-tender; bowel sounds normal; no masses,  no organomegaly Extremities: extremities normal, atraumatic, no cyanosis or edema Neurologic: Alert and oriented X 3, normal strength and tone. Normal symmetric reflexes. Normal coordination and gait  ECOG PERFORMANCE STATUS: 1 - Symptomatic but completely ambulatory  Blood pressure 102/66, pulse 91, temperature 98.9 F (37.2 C), temperature source Oral, resp. rate 18, height 5\' 4"  (1.626 m), weight 207 lb (93.9 kg), SpO2 94 %.  LABORATORY DATA: Lab Results  Component Value Date   WBC 11.7 (H) 06/17/2022   HGB 9.7 (L) 06/17/2022   HCT 32.4 (L) 06/17/2022   MCV 85.9 06/17/2022   PLT 355 06/17/2022      Chemistry  Component Value Date/Time   NA 141 06/17/2022 1018   NA 145 (H) 04/19/2022 0944   K 3.7 06/17/2022 1018   CL 98 06/17/2022 1018   CO2 37 (H) 06/17/2022 1018   BUN 12 06/17/2022 1018   BUN 9 04/19/2022 0944   CREATININE 0.91 06/17/2022 1018   CREATININE 0.81 07/02/2016 1323      Component Value Date/Time   CALCIUM 9.9 06/17/2022 1018   ALKPHOS 69 06/17/2022 1018   AST 29 06/17/2022 1018   ALT 35 06/17/2022 1018   BILITOT 0.4 06/17/2022 1018       RADIOGRAPHIC STUDIES: NM PET Image Restage (PS) Skull Base to Thigh (F-18 FDG)  Result Date: 07/27/2022 CLINICAL DATA:  Subsequent treatment strategy for non-small cell lung cancer. EXAM: NUCLEAR MEDICINE PET SKULL BASE  TO THIGH TECHNIQUE: 10.56 mCi F-18 FDG was injected intravenously. Full-ring PET imaging was performed from the skull base to thigh after the radiotracer. CT data was obtained and used for attenuation correction and anatomic localization. Fasting blood glucose: 100 mg/dl COMPARISON:  CT chest 40/98/1191 and CT AP 04/25/2022 FINDINGS: Mediastinal blood pool activity: SUV max 3.06 Liver activity: SUV max NA NECK: No hypermetabolic lymph nodes in the neck. Incidental CT findings: None. CHEST: Again seen is a moderate loculated left pleural effusion with components extending over the left apex and along the left lung base. Marked volume loss is identified from the left upper lobe which may be postsurgical Similar appearance of extensive consolidative changes identified within the left mid and left lower lung. Diffuse tracer uptake within the consolidated portions of the left lung have an SUV max of 7.07, image 72 of the fused PET-CT images. No signs of tracer avid supraclavicular, axillary or hilar lymph nodes. There is chronic occlusion of the left upper lobe pulmonary vein with increased uptake with SUV max of 5.21. There is a nodule within the anterior right upper lobe which measures 1.1 cm and has an SUV max of 13.35, image 18/7. This is a new finding when compared with the previous exam. Incidental CT findings: Emphysema. Cardiac enlargement. Aortic atherosclerosis. Status post CABG procedure. No significant pericardial effusion. ABDOMEN/PELVIS: No abnormal tracer uptake within the liver, pancreas, spleen, or adrenal glands. Multiple tracer avid left retroperitoneal lymph nodes. Index tracer avid left periaortic lymph node measures 1.8 cm and has an SUV max of 20.38. On 04/25/2022 this measured 1.7 cm. Within the left posterior pelvis there is a intensely tracer avid lesion along the left side of the vaginal dome which measures 2.5 cm and has an SUV max of 16.12. This is new when compared with the prior PET-CT from  06/08/2021. Tracer avid mass lesion within the anterior cortex of the interpolar right kidney measures approximately 3.1 cm and has an SUV max of 15.76. This corresponds to previously noted 1.2 cm enhancing kidney lesion noted on 04/25/2022. Arising off the medial cortex of the inferior pole of the left kidney is a 4.8 cm centrally necrotic mass with peripheral increased uptake. This measures 4.8 cm with SUV max of 14.59. On the exam from 04/25/2022 this measured 3.4 cm. Incidental CT findings: Aortic atherosclerosis. Sigmoid diverticulosis without acute diverticulitis. SKELETON: No focal hypermetabolic activity to suggest skeletal metastasis. Incidental CT findings: None. IMPRESSION: 1. Interval progression of disease. 2. There is a new tracer avid nodule within the right upper lobe which is worrisome for either metastatic disease or new primary bronchogenic carcinoma. 3. Tracer avid bilateral kidney lesions are identified and have increased in  size when compared with 04/25/2022. Findings are concerning for either renal metastasis versus bilateral primary renal neoplasms. Given the rapid interval increase in size from previous exam findings are favored to represent metastatic disease. 4. Multifocal tracer avid left retroperitoneal lymph nodes compatible with nodal metastasis. Similar in size to 04/25/2022. 5. Tracer avid soft tissue nodule within the left side of pelvis along the left vaginal cuff also worrisome for malignancy. 6. There is diffuse increased FDG uptake associated with the consolidated left lung which is nonspecific and may reflect underlying inflammatory or infectious process. Underlying tumor cannot be excluded. 7. Similar appearance of chronic, loculated left pleural effusion with basilar and apical components. 8.  Aortic Atherosclerosis (ICD10-I70.0). Electronically Signed   By: Signa Kell M.D.   On: 07/27/2022 14:14    ASSESSMENT AND PLAN: This is a very pleasant 77 years old white female  with Stage IIb/IIIa (T3, N0/N2, M0) non-small cell lung cancer, squamous cell carcinoma presented with left hilar mass with suspicious mediastinal lymphadenopathy on the last CT scan of the chest at [facility] health. She also has a history of stage Ia non-small cell lung cancer, adenocarcinoma status post surgical resection in July 2012 as well as recurrent stage Ia non-small cell lung cancer, squamous cell carcinoma status post SBRT completed January 30, 2021. The patient completed a course of concurrent chemoradiation with weekly carboplatin for AUC of 2 and paclitaxel 45 Mg/M2 status post 6 cycles.   She underwent consolidation treatment with immunotherapy with Imfinzi 1500 Mg IV every 4 weeks.  Status post 4 cycle started on 8/31 2023. The patient has been tolerating this treatment well except for the recent admission to the hospital with worsening dyspnea that was suspicious to be secondary to either pulmonary edema versus infection versus drug-induced pneumonitis from amiodarone or Imfinzi. Her treatment with amiodarone was discontinued. The patient has been in observation since that time. Her CT scan of the chest on 06/17/2022 showed increasing right hilar lymph node.  There was increasing left-sided pleural effusion and consolidative lung opacity and additional 2 areas of interstitial septal thickening and groundglass. I did order a PET scan which was performed on 07/25/2022 and that showed evidence for interval disease progression with new tracer avid nodule within the right upper lobe worrisome for either metastatic disease or new primary bronchogenic carcinoma.  There was also tracer avid bilateral kidney lesions increasing in size and concerning for either renal metastasis versus bilateral primary renal neoplasm but giving the rapid interval increase in size this favored to represent metastatic disease.  There was also multifocal tracer avid left retroperitoneal lymph nodes compatible with nodal  metastasis.  The patient also incidentally had tracer avid soft tissue nodule within the left side of the pelvis along the left vaginal cuff worrisome for malignancy and diffuse increased FDG uptake associated with the consolidated left lung that is nonspecific and may reflect underlying inflammatory/infectious process but underlying tumor cannot be excluded. I had a lengthy discussion with the patient and her family about her recent scan results and further recommendations to identify the etiology of her hypermetabolic activity. I recommended for the patient to see Dr. Tonia Brooms for consideration of bronchoscopy and biopsy of the left upper lobe lung nodule. Depending on the biopsy results, I will discuss with the patient her treatment options including palliative care and hospice versus consideration of systemic chemotherapy with or without immunotherapy. For the hypermetabolic soft tissue area and the pelvic area/vagina, I will send the patient to her gynecologist for  evaluation. For the fever that has been going on for the last few days, I will start the patient empirically on Augmentin 875 mg p.o. twice daily for 1 week. The patient will come back for follow-up visit in 1 months for evaluation.  I tried to see her sooner but she is traveling to [place] and would like to come back after her visit. She was advised to call immediately if she has any other concerning symptoms in the interval. The patient voices understanding of current disease status and treatment options and is in agreement with the current care plan.  All questions were answered. The patient knows to call the clinic with any problems, questions or concerns. We can certainly see the patient much sooner if necessary.  The total time spent in the appointment was 40 minutes.  Disclaimer: This note was dictated with voice recognition software. Similar sounding words can inadvertently be transcribed and may not be corrected upon  review.

## 2022-07-31 ENCOUNTER — Telehealth: Payer: Self-pay | Admitting: Internal Medicine

## 2022-07-31 NOTE — Telephone Encounter (Signed)
Scheduled per 06/10 los, patient has been called and notified. 

## 2022-08-01 ENCOUNTER — Telehealth: Payer: Self-pay | Admitting: *Deleted

## 2022-08-01 NOTE — Telephone Encounter (Signed)
Called and spoke to pt. Informed her she would be able to travel per Dr. Tonia Brooms. Pt verbalized understanding and denied any further questions or concerns at this time.

## 2022-08-01 NOTE — Progress Notes (Signed)
  Care Coordination   Note   08/01/2022 Name: [name] MRN: [id number] DOB: 27-Oct-1945  ELIZ NIGG is a 77 y.o. year old female who sees Hermenia Fiscal, Kathlynn Grate, NP for primary care. I reached out to Hadassah Pais by phone today to offer care coordination services.  Ms. Schiro was given information about Care Coordination services today including:   The Care Coordination services include support from the care team which includes your Nurse Coordinator, Clinical Social Worker, or Pharmacist.  The Care Coordination team is here to help remove barriers to the health concerns and goals most important to you. Care Coordination services are voluntary, and the patient may decline or stop services at any time by request to their care team member.   Care Coordination Consent Status: Patient did not agree to participate in care coordination services at this time.    Encounter Outcome:  Pt. Refused  [facility] Coordination Care Guide  Direct Dial: [phone]

## 2022-08-06 ENCOUNTER — Other Ambulatory Visit: Payer: Self-pay | Admitting: Internal Medicine

## 2022-08-07 ENCOUNTER — Encounter (HOSPITAL_COMMUNITY): Payer: Self-pay | Admitting: Physician Assistant

## 2022-08-07 ENCOUNTER — Ambulatory Visit (HOSPITAL_COMMUNITY)
Admission: RE | Admit: 2022-08-07 | Discharge: 2022-08-07 | Disposition: A | Discharge status: Home / Self Care | Payer: Medicare HMO | Source: Ambulatory Visit | Attending: Physician Assistant | Admitting: Physician Assistant

## 2022-08-07 VITALS — BP 122/68 | HR 86 | Ht 64.0 in | Wt 204.0 lb

## 2022-08-07 DIAGNOSIS — Z87891 Personal history of nicotine dependence: Secondary | ICD-10-CM | POA: Diagnosis not present

## 2022-08-07 DIAGNOSIS — I2510 Atherosclerotic heart disease of native coronary artery without angina pectoris: Secondary | ICD-10-CM | POA: Diagnosis not present

## 2022-08-07 DIAGNOSIS — Z951 Presence of aortocoronary bypass graft: Secondary | ICD-10-CM | POA: Insufficient documentation

## 2022-08-07 DIAGNOSIS — I10 Essential (primary) hypertension: Secondary | ICD-10-CM | POA: Diagnosis not present

## 2022-08-07 DIAGNOSIS — I4892 Unspecified atrial flutter: Secondary | ICD-10-CM | POA: Insufficient documentation

## 2022-08-07 DIAGNOSIS — Z85118 Personal history of other malignant neoplasm of bronchus and lung: Secondary | ICD-10-CM | POA: Diagnosis not present

## 2022-08-07 DIAGNOSIS — Z79899 Other long term (current) drug therapy: Secondary | ICD-10-CM | POA: Diagnosis not present

## 2022-08-07 DIAGNOSIS — I480 Paroxysmal atrial fibrillation: Secondary | ICD-10-CM | POA: Diagnosis not present

## 2022-08-07 DIAGNOSIS — J449 Chronic obstructive pulmonary disease, unspecified: Secondary | ICD-10-CM | POA: Diagnosis not present

## 2022-08-07 DIAGNOSIS — Z9981 Dependence on supplemental oxygen: Secondary | ICD-10-CM | POA: Diagnosis not present

## 2022-08-07 DIAGNOSIS — D6869 Other thrombophilia: Secondary | ICD-10-CM | POA: Diagnosis not present

## 2022-08-07 DIAGNOSIS — R1900 Intra-abdominal and pelvic swelling, mass and lump, unspecified site: Secondary | ICD-10-CM | POA: Diagnosis not present

## 2022-08-07 NOTE — Progress Notes (Signed)
Primary Care Physician: Megan Jews, [name] Primary Cardiologist: Dr Tresa Endo Primary Electrophysiologist: none Referring Physician: Dr [name] is a 77 y.o. female with a history of CAD status post CABG in 2012, ischemic cardiomyopathy (EF 35% in 2012, improved to 50 to 55% 10/2020) NSCLC, COPD, tobacco use, chronic respiratory failure on 3L, cerebral amyloid angiopathy, atrial fibrillation who presents for follow up in the [facility]. During admission 07/2021 she was found to have paroxysmal atrial flutter, started on amiodarone at that time. Anticoagulation was deferred given concern for amyloid angiopathy on brain MRI with evidence of chronic peripheral microhemorrhage. She was admitted 01/28/2022 with worsening hypoxia, noted to have SpO2 down to 70s on 5 L at PCP office. CTPA showed no PE but showed new groundglass opacities and interstitial opacities. She was started on antibiotics, steroids, and Lasix. PCCM was consulted, thought to represent either immunotherapy or amiodarone related pneumonitis. She was started on high-dose Solu-Medrol and Rocephin/azithromycin. Amiodarone was discontinued. Patient has a CHADS2VASC score of 4. Patient referred to the AF clinic to discuss rhythm control options. At her visit 04/09/22, patient opted for watchful waiting.   On follow up today, patient reports that she has done well from a cardiac standpoint. She has not had any sustained elevated heart rates or tachypalpitations. She is not on anticoagulation due to her bleeding risk.   Today, she denies symptoms of palpitations, chest pain, shortness of breath, orthopnea, PND, lower extremity edema, dizziness, presyncope, syncope, snoring, daytime somnolence, bleeding, or neurologic sequela. The patient is tolerating medications without difficulties and is otherwise without complaint today.    Atrial Fibrillation Risk Factors:  she does not have symptoms or  diagnosis of sleep apnea. she does not have a history of rheumatic fever.   Atrial Fibrillation Management history:  Previous antiarrhythmic drugs: amiodarone Previous cardioversions: none Previous ablations: none Anticoagulation history: none   Past Medical History:  Diagnosis Date   CAD (coronary artery disease)    Cancer (HCC)    COPD (chronic obstructive pulmonary disease) (HCC)    per DM note, pt denies   Dyslipidemia    GERD (gastroesophageal reflux disease)    History of radiation therapy    Left lung- 01/16/21-01/30/21- Dr. Antony Blackbird   History of radiation therapy    Left lung- 08/02/21-09/18/21- Dr. Antony Blackbird   HTN (hypertension)    Hypothyroidism    Myocardial infarct [facility])    Non-small cell lung cancer (HCC) 08/19/2010   Stage IA, status post left upper lobectomy July 2012   Tobacco abuse     ROS- All systems are reviewed and negative except as per the HPI above.  Physical Exam: Vitals:   08/07/22 1437  BP: 122/68  Pulse: 86  Weight: 92.5 kg  Height: 5\' 4"  (1.626 m)    GEN: Well nourished, well developed in no acute distress NECK: No JVD; No carotid bruits CARDIAC: Regular rate and rhythm, no murmurs, rubs, gallops RESPIRATORY:  Clear to auscultation without rales, wheezing or rhonchi, on O2 nasal canula  ABDOMEN: Soft, non-tender, non-distended EXTREMITIES:  No edema; No deformity    Wt Readings from Last 3 Encounters:  08/07/22 92.5 kg  07/29/22 93.9 kg  07/26/22 94.4 kg    EKG today demonstrates  SR Vent. rate 86 BPM PR interval 168 ms QRS duration 88 ms QT/QTcB 408/488 ms  Echo 01/21/22 demonstrated   1. Difficult study due to poor visualization of LV endocardium.  2. Left ventricular ejection fraction, by estimation, is 55 to 60%. The  left ventricle has normal function. Left ventricular endocardial border  not optimally defined to evaluate regional wall motion. Left ventricular  diastolic parameters are consistent with Grade  I diastolic dysfunction (impaired relaxation). The average left ventricular global longitudinal strain is -18.1 %. The global longitudinal strain is normal.   3. Right ventricular systolic function is mildly reduced. The right  ventricular size is normal. Tricuspid regurgitation signal is inadequate  for assessing PA pressure.   4. The mitral valve is grossly normal. Trivial mitral valve  regurgitation.   5. The aortic valve was not well visualized. Aortic valve regurgitation  is not visualized. No aortic stenosis is present.   6. The inferior vena cava is normal in size with greater than 50%  respiratory variability, suggesting right atrial pressure of 3 mmHg.   Comparison(s): Compared to prior TTE in 10/2020, the EF appears slightly improved from 50-55% to 55%. Otherwise, there is no significant change.   Epic records are reviewed at length today  CHA2DS2-VASc Score = 4  The patient's score is based upon: CHF History: 0 (EF normalized) HTN History: 0 Diabetes History: 0 Stroke History: 0 Vascular Disease History: 1 Age Score: 2 Gender Score: 1       ASSESSMENT AND PLAN: Paroxysmal Atrial Fibrillation/atrial flutter The patient's CHA2DS2-VASc score is 4, indicating a 4.8% annual risk of stroke.   Amiodarone discontinued due to concern about possible lung toxicity. Multaq should also not be used if amio lung toxicity suspected. Would avoid class IC with h/o CAD. Not an ablation candidate with other comorbidities. Dofetilide is likely the only rhythm control option. She has deferred dofetilide admission for now and opted for watchful waiting.   Not felt to be a candidate for anticoagulation with h/o cerebral amyloid angiopathy on MRI with evidence of microhemorrhage.  Continue Toprol 50 mg daily   Secondary Hypercoagulable State (ICD10:  D68.69) The patient is at significant risk for stroke/thromboembolism based upon her CHA2DS2-VASc Score of 4.  However, the patient is not on  anticoagulation due to her high bleeding risk. Unlikely Watchman candidate with other comorbidities.   CAD S/p CABG 2012 No anginal symptoms.  Non small cell lung cancer Plans per pulmonology and oncology   Follow up with Edd Fabian and Dr Tresa Endo per recalls. AF clinic as needed.    Megan Loa PA-C [facility] [address] [place], [place] [address] [phone] 08/07/2022 2:47 PM

## 2022-08-08 ENCOUNTER — Other Ambulatory Visit (HOSPITAL_COMMUNITY): Payer: Self-pay | Admitting: Obstetrics and Gynecology

## 2022-08-08 DIAGNOSIS — R1900 Intra-abdominal and pelvic swelling, mass and lump, unspecified site: Secondary | ICD-10-CM

## 2022-08-09 ENCOUNTER — Encounter: Payer: Self-pay | Admitting: Emergency Medicine

## 2022-08-09 ENCOUNTER — Other Ambulatory Visit: Payer: Self-pay | Admitting: Nurse Practitioner

## 2022-08-09 ENCOUNTER — Telehealth: Payer: Self-pay | Admitting: Acute Care

## 2022-08-09 ENCOUNTER — Ambulatory Visit: Payer: Medicare HMO | Admitting: Acute Care

## 2022-08-09 ENCOUNTER — Encounter: Payer: Self-pay | Admitting: Acute Care

## 2022-08-09 VITALS — BP 100/54 | HR 73 | Temp 98.4°F | Ht 64.0 in | Wt 205.8 lb

## 2022-08-09 DIAGNOSIS — Z85118 Personal history of other malignant neoplasm of bronchus and lung: Secondary | ICD-10-CM | POA: Diagnosis not present

## 2022-08-09 DIAGNOSIS — R911 Solitary pulmonary nodule: Secondary | ICD-10-CM

## 2022-08-09 DIAGNOSIS — I10 Essential (primary) hypertension: Secondary | ICD-10-CM

## 2022-08-09 NOTE — Progress Notes (Signed)
History of Present Illness [name] is a 77 y.o. female with history of lung cancer 2012  with recurrence 01/2021. She is here for evaluation after  imaging evidence for interval disease progression. She has been referred to Dr. Tonia Brooms by Dr. Arbutus Ped  for biopsy of nodule within the right upper lobe concerning for malignancy.  Synopsis 77 year old white female with Stage IIb/IIIa (T3, N0/N2, M0) non-small cell lung cancer, squamous cell carcinoma presented with left hilar mass with suspicious mediastinal lymphadenopathy on the last CT scan of the chest at [facility]. She also has a history of stage Ia non-small cell lung cancer, adenocarcinoma status post surgical resection in July 2012 as well as recurrent stage Ia non-small cell lung cancer, squamous cell carcinoma status post SBRT completed January 30, 2021. The patient completed a course of concurrent chemoradiation with weekly carboplatin for AUC of 2 and paclitaxel 45 Mg/M2 status post 6 cycles.   She underwent consolidation treatment with immunotherapy with Imfinzi 1500 Mg IV every 4 weeks.  Status post 4 cycle started on 8/31 2023. The patient  tolerated this treatment well with exception  of admission to the hospital with worsening dyspnea that was thought  to be secondary to either pulmonary edema versus infection versus drug-induced pneumonitis from amiodarone or Imfinzi. Her treatment with amiodarone was discontinued. The patient has been in observation since that time. Surveillance CT scan of the chest on 06/17/2022 showed increasing right hilar lymph node.  There was increasing left-sided pleural effusion and consolidative lung opacity and additional 2 areas of interstitial septal thickening and groundglass. Dr. Arbutus Ped ordered a PET scan which was performed on 07/25/2022  that showed evidence for interval disease progression with new tracer avid nodule within the right upper lobe worrisome for either metastatic disease or new  primary bronchogenic carcinoma.  There was also tracer avid bilateral kidney lesions increasing in size and concerning for either renal metastasis versus bilateral primary renal neoplasm but giving the rapid interval increase in size this favored to represent metastatic disease.  There was also multifocal tracer avid left retroperitoneal lymph nodes compatible with nodal metastasis.  The patient also incidentally had tracer avid soft tissue nodule within the left side of the pelvis along the left vaginal cuff worrisome for malignancy and diffuse increased FDG uptake associated with the consolidated left lung that is nonspecific and may reflect underlying inflammatory/infectious process but underlying tumor cannot be excluded. Dr. Arbutus Ped had a lengthy discussion with the patient and her family about her recent scan results and further recommendations to identify the etiology of her hypermetabolic activity. He  recommended the patient  see Dr. Tonia Brooms for consideration of bronchoscopy and biopsy of the left upper lobe lung nodule to determine if this is recurrence of previous cancer vs a new primary cancer.. Depending on the biopsy results, Dr. Arbutus Ped will discuss with the patient her treatment options including palliative care and hospice versus consideration of systemic chemotherapy with or without immunotherapy.   08/09/2022 Pt. Presents today for consult for bronchoscopy with biopsies of the new right lung nodule in setting or previous lung cancer . She is currently on oxygen at 4 L continuously. She has been on oxygen for about 2 years. She has a history of a lung biopsy last year and she did develop a pleural effusion  after that biopsy which required a chest tube.  We discussed that she does have some increased risk as she has underlying lung disease, and previous surgical  resection in 2012. We discussed the risk for infection, bleeding and adverse reaction to anesthesia. She verbalized understanding of  the above. We have her tentatively scheduled for a bronch with Dr. Delton Coombes 08/27/2022. Hold ASA the night before surgery. Pt is suspected of having OSA but has never been diagnosed . She does snore at night.  She is followed by Dr. Tresa Endo for cardiology. I have asked the patient to let him know about the bronchoscopy, to ensure they do not need to make any medication changes. I have sent a telephone message to ensure they are aware and are in agreement with procedure.   Test Results: PET Scan 07/25/2022 Interval progression of disease. 2. There is a new tracer avid nodule within the right upper lobe which is worrisome for either metastatic disease or new primary bronchogenic carcinoma. 3. Tracer avid bilateral kidney lesions are identified and have increased in size when compared with 04/25/2022. Findings are concerning for either renal metastasis versus bilateral primary renal neoplasms. Given the rapid interval increase in size from previous exam findings are favored to represent metastatic disease. 4. Multifocal tracer avid left retroperitoneal lymph nodes compatible with nodal metastasis. Similar in size to 04/25/2022. 5. Tracer avid soft tissue nodule within the left side of pelvis along the left vaginal cuff also worrisome for malignancy. 6. There is diffuse increased FDG uptake associated with the consolidated left lung which is nonspecific and may reflect underlying inflammatory or infectious process. Underlying tumor cannot be excluded. 7. Similar appearance of chronic, loculated left pleural effusion with basilar and apical components. 8.  Aortic Atherosclerosis (ICD10-I70.0).   06/17/2022 CT Chest with contrast Increasing right hilar lymph node. There are some small nodes elsewhere in the mediastinum in addition which are not pathologic by size criteria but slightly more prominent than on prior.   Increasing left-sided pleural effusion and consolidative lung opacity. Additional  areas of interstitial septal thickening ground-glass.   Increasing mass lesions at the edge of the imaging field along the kidneys. Please correlate with more recent CT of the abdomen from 04/25/2022.      Latest Ref Rng & Units 06/17/2022   10:18 AM 05/06/2022    3:49 PM 04/19/2022    9:44 AM  CBC  WBC 4.0 - 10.5 K/uL 11.7  13.5  10.2   Hemoglobin 12.0 - 15.0 g/dL 9.7  84.1  9.5   Hematocrit 36.0 - 46.0 % 32.4  34.7  31.2   Platelets 150 - 400 K/uL 355  362.0  358        Latest Ref Rng & Units 06/17/2022   10:18 AM 04/19/2022    9:44 AM 03/20/2022    2:24 PM  BMP  Glucose 70 - 99 mg/dL 324  86  401   BUN 8 - 23 mg/dL 12  9  17    Creatinine 0.44 - 1.00 mg/dL 0.27  2.53  6.64   BUN/Creat Ratio 12 - 28  9    Sodium 135 - 145 mmol/L 141  145  144   Potassium 3.5 - 5.1 mmol/L 3.7  4.0  4.7   Chloride 98 - 111 mmol/L 98  99  99   CO2 22 - 32 mmol/L 37  28  36   Calcium 8.9 - 10.3 mg/dL 9.9  9.3  9.5     BNP    Component Value Date/Time   BNP 252.8 (H) 02/01/2022 1239    ProBNP    Component Value Date/Time   PROBNP 84.0 03/20/2022  1424    PFT    Component Value Date/Time   FEV1PRE 1.14 09/11/2020 1246   FEV1POST 1.12 09/11/2020 1246   FVCPRE 1.38 09/11/2020 1246   FVCPOST 1.44 09/11/2020 1246   TLC 3.02 09/11/2020 1246   DLCOUNC 9.54 09/11/2020 1246   PREFEV1FVCRT 83 09/11/2020 1246   PSTFEV1FVCRT 78 09/11/2020 1246    NM PET Image Restage (PS) Skull Base to Thigh (F-18 FDG)  Result Date: 07/27/2022 CLINICAL DATA:  Subsequent treatment strategy for non-small cell lung cancer. EXAM: NUCLEAR MEDICINE PET SKULL BASE TO THIGH TECHNIQUE: 10.56 mCi F-18 FDG was injected intravenously. Full-ring PET imaging was performed from the skull base to thigh after the radiotracer. CT data was obtained and used for attenuation correction and anatomic localization. Fasting blood glucose: 100 mg/dl COMPARISON:  CT chest 84/69/6295 and CT AP 04/25/2022 FINDINGS: Mediastinal blood pool  activity: SUV max 3.06 Liver activity: SUV max NA NECK: No hypermetabolic lymph nodes in the neck. Incidental CT findings: None. CHEST: Again seen is a moderate loculated left pleural effusion with components extending over the left apex and along the left lung base. Marked volume loss is identified from the left upper lobe which may be postsurgical Similar appearance of extensive consolidative changes identified within the left mid and left lower lung. Diffuse tracer uptake within the consolidated portions of the left lung have an SUV max of 7.07, image 72 of the fused PET-CT images. No signs of tracer avid supraclavicular, axillary or hilar lymph nodes. There is chronic occlusion of the left upper lobe pulmonary vein with increased uptake with SUV max of 5.21. There is a nodule within the anterior right upper lobe which measures 1.1 cm and has an SUV max of 13.35, image 18/7. This is a new finding when compared with the previous exam. Incidental CT findings: Emphysema. Cardiac enlargement. Aortic atherosclerosis. Status post CABG procedure. No significant pericardial effusion. ABDOMEN/PELVIS: No abnormal tracer uptake within the liver, pancreas, spleen, or adrenal glands. Multiple tracer avid left retroperitoneal lymph nodes. Index tracer avid left periaortic lymph node measures 1.8 cm and has an SUV max of 20.38. On 04/25/2022 this measured 1.7 cm. Within the left posterior pelvis there is a intensely tracer avid lesion along the left side of the vaginal dome which measures 2.5 cm and has an SUV max of 16.12. This is new when compared with the prior PET-CT from 06/08/2021. Tracer avid mass lesion within the anterior cortex of the interpolar right kidney measures approximately 3.1 cm and has an SUV max of 15.76. This corresponds to previously noted 1.2 cm enhancing kidney lesion noted on 04/25/2022. Arising off the medial cortex of the inferior pole of the left kidney is a 4.8 cm centrally necrotic mass with  peripheral increased uptake. This measures 4.8 cm with SUV max of 14.59. On the exam from 04/25/2022 this measured 3.4 cm. Incidental CT findings: Aortic atherosclerosis. Sigmoid diverticulosis without acute diverticulitis. SKELETON: No focal hypermetabolic activity to suggest skeletal metastasis. Incidental CT findings: None. IMPRESSION: 1. Interval progression of disease. 2. There is a new tracer avid nodule within the right upper lobe which is worrisome for either metastatic disease or new primary bronchogenic carcinoma. 3. Tracer avid bilateral kidney lesions are identified and have increased in size when compared with 04/25/2022. Findings are concerning for either renal metastasis versus bilateral primary renal neoplasms. Given the rapid interval increase in size from previous exam findings are favored to represent metastatic disease. 4. Multifocal tracer avid left retroperitoneal lymph nodes compatible  with nodal metastasis. Similar in size to 04/25/2022. 5. Tracer avid soft tissue nodule within the left side of pelvis along the left vaginal cuff also worrisome for malignancy. 6. There is diffuse increased FDG uptake associated with the consolidated left lung which is nonspecific and may reflect underlying inflammatory or infectious process. Underlying tumor cannot be excluded. 7. Similar appearance of chronic, loculated left pleural effusion with basilar and apical components. 8.  Aortic Atherosclerosis (ICD10-I70.0). Electronically Signed   By: Signa Kell M.D.   On: 07/27/2022 14:14     Past medical hx Past Medical History:  Diagnosis Date   CAD (coronary artery disease)    Cancer (HCC)    COPD (chronic obstructive pulmonary disease) (HCC)    per DM note, pt denies   Dyslipidemia    GERD (gastroesophageal reflux disease)    History of radiation therapy    Left lung- 01/16/21-01/30/21- Dr. Antony Blackbird   History of radiation therapy    Left lung- 08/02/21-09/18/21- Dr. Antony Blackbird   HTN  (hypertension)    Hypothyroidism    Myocardial infarct [facility])    Non-small cell lung cancer (HCC) 08/19/2010   Stage IA, status post left upper lobectomy July 2012   Tobacco abuse      Social History   Tobacco Use   Smoking status: Former    Packs/day: 1.00    Years: 58.00    Additional pack years: 0.00    Total pack years: 58.00    Types: Cigarettes    Quit date: 07/19/2021    Years since quitting: 1.0   Smokeless tobacco: Former   Tobacco comments:    Former smoker 02/19/22  Vaping Use   Vaping Use: Never used  Substance Use Topics   Alcohol use: No    Alcohol/week: 0.0 standard drinks of alcohol   Drug use: No    [name] reports that she quit smoking about 12 months ago. Her smoking use included cigarettes. She has a 58.00 pack-year smoking history. She has quit using smokeless tobacco. She reports that she does not drink alcohol and does not use drugs.  Tobacco Cessation: Counseling given: Not Answered Tobacco comments: Former smoker 02/19/22   Past surgical hx, Family hx, Social hx all reviewed.  Current Outpatient Medications on File Prior to Visit  Medication Sig   acetaminophen (TYLENOL) 500 MG tablet Take 500 mg by mouth every 6 (six) hours as needed for moderate pain or fever.   amoxicillin-clavulanate (AUGMENTIN) 875-125 MG tablet Take 1 tablet by mouth 2 (two) times daily.   arformoterol (BROVANA) 15 MCG/2ML NEBU Take 2 mLs (15 mcg total) by nebulization 2 (two) times daily. (Patient taking differently: Take 15 mcg by nebulization daily.)   aspirin 81 MG tablet Take 81 mg by mouth at bedtime.   budesonide (PULMICORT) 0.5 MG/2ML nebulizer solution Take 2 mLs (0.5 mg total) by nebulization 2 (two) times daily. (Patient taking differently: Take 0.5 mg by nebulization daily.)   Coenzyme Q10 (CO Q 10 PO) Take 1 capsule by mouth daily.   furosemide (LASIX) 20 MG tablet Take 1 tablet (20 mg total) by mouth daily.   levothyroxine (SYNTHROID) 25 MCG tablet Take 0.5  tablets (12.5 mcg total) by mouth daily. In addition to the 50 mcg.   levothyroxine (SYNTHROID) 50 MCG tablet TAKE 1 TABLET BY MOUTH DAILY ALONG WITH 12.5 MCG EXCEPT ON SUNDAY TAKE 50 MCG   lidocaine-prilocaine (EMLA) cream Apply 1 Application topically as needed. (Patient taking differently: Apply 1 Application topically as  needed (pain).)   naproxen sodium (ALEVE) 220 MG tablet Take 220 mg by mouth 2 (two) times daily as needed (pain).   Omega-3 Fatty Acids (OMEGA-3 PO) Take 1,000 mg by mouth in the morning and at bedtime.   potassium chloride (KLOR-CON M10) 10 MEQ tablet Take 1 tablet (10 mEq total) by mouth daily.   revefenacin (YUPELRI) 175 MCG/3ML nebulizer solution INHALE VIA NEBULIZER DAILY   rosuvastatin (CRESTOR) 40 MG tablet Take 1 tablet (40 mg total) by mouth daily.   No current facility-administered medications on file prior to visit.     Allergies  Allergen Reactions   Amiodarone Shortness Of Breath    Contraindicated for patients on chemotherapy or immunotherapy    Codeine Other (See Comments)    Unknown reaction Patient avoids this medication    Review Of Systems:  Constitutional:   No  weight loss, night sweats,  Fevers, chills, fatigue, or  lassitude.  HEENT:   No headaches,  Difficulty swallowing,  Tooth/dental problems, or  Sore throat,                No sneezing, itching, ear ache, nasal congestion, post nasal drip,   CV:  No chest pain,  Orthopnea, PND, swelling in lower extremities, anasarca, dizziness, palpitations, syncope.   GI  No heartburn, indigestion, abdominal pain, nausea, vomiting, diarrhea, change in bowel habits, loss of appetite, bloody stools.   Resp: + baseline  shortness of breath with exertion less at rest.  No excess mucus, no productive cough,  No non-productive cough,  No coughing up of blood.  No change in color of mucus.  No wheezing.  No chest wall deformity  Skin: no rash or lesions.  GU: no dysuria, change in color of urine, no  urgency or frequency.  No flank pain, no hematuria   MS:  No joint pain or swelling.  No decreased range of motion.  No back pain.  Psych:  No change in mood or affect. No depression or anxiety.  No memory loss.   Vital Signs BP (!) 100/54   Pulse 73   Temp 98.4 F (36.9 C) (Oral)   Ht 5\' 4"  (1.626 m)   Wt 205 lb 12.8 oz (93.4 kg)   SpO2 94%   BMI 35.33 kg/m    Physical Exam:  General- No distress,  A&Ox3, elderly female in wheelchair ENT: No sinus tenderness, TM clear, pale nasal mucosa, no oral exudate,no post nasal drip, no LAN Cardiac: S1, S2, regular rate and rhythm, no murmur Chest: No wheeze/ rales/ dullness; no accessory muscle use, no nasal flaring, no sternal retractions, diminished per bases  Abd.: Soft Non-tender, ND, BS +. Body mass index is 35.33 kg/m.  Ext: No clubbing cyanosis, edema, muscle wasting Neuro: physically deconditioned, MAE x 4, A&O x 3, in wheelchair Skin: No rashes, warm and dry, no lesions  Psych: normal mood and behavior   Assessment/Plan New Pulmonary Nodules in setting of previous 1A non small cell carcinoma ( 2012) with resection and re-currant disease 01/2023 with concurrent chemo/radiation and immunotherapy. Need for biopsy of new nodules of concern to rule out new primary vs disease progression  Plan We will schedule you for a bronchoscopy with Dr. Delton Coombes 08/27/2022.  This will be done at [facility]. Hold aspirin the night before surgery. You will get a letter to give you additional information about the procedure. I have checked with your cardiologist, and they have cleared you for this procedure.  You will have  a follow up with Dr. Delton Coombes after the procedure.  Enjoy your time in [place].  Good luck with your treatment. Please contact office for sooner follow up if symptoms do not improve or worsen or seek emergency care    I spent 45 minutes dedicated to the care of this patient on the date of this encounter to include  pre-visit review of records, face-to-face time with the patient discussing conditions above, post visit ordering of testing, clinical documentation with the electronic health record, making appropriate referrals as documented, and communicating necessary information to the patient's healthcare team.   Bevelyn Ngo, NP 08/09/2022  1:02 PM

## 2022-08-09 NOTE — Telephone Encounter (Signed)
     Primary Cardiologist: Nicki Guadalajara, MD  Chart reviewed as part of pre-operative protocol coverage. Given past medical history and time since last visit, based on ACC/AHA guidelines, BAYLEN DEA would be at acceptable risk for the planned procedure without further cardiovascular testing.     I will route this recommendation to the requesting party via Epic fax function and remove from pre-op pool.  Please call with questions.  Thomasene Ripple. Emmalou Hunger NP-C     08/09/2022, 12:13 PM [facility] [address] Office [phone] Fax [phone]\

## 2022-08-09 NOTE — Patient Instructions (Addendum)
It is good to see you today. We will schedule you for a bronchoscopy with Dr. Delton Coombes 08/27/2022.  This will be done at [facility]. Hold aspirin the night before surgery. You will get a letter to give you additional information about the procedure. You will have a follow up with Dr. Delton Coombes after the procedure.  Enjoy your time in [place].  Good luck with your treatment. Please contact office for sooner follow up if symptoms do not improve or worsen or seek emergency care

## 2022-08-09 NOTE — Telephone Encounter (Signed)
Just wanted you to know we are planning on bronch with biopsies for [name] for re-current lung cancer, This will be done under general anesthesia . Just making sure you all are ok for pre-op clearance. Thanks

## 2022-08-12 NOTE — Progress Notes (Signed)
Oley Balm, MD  Leodis Rains D PROCEDURE / BIOPSY REVIEW Date: 08/12/22  Requested Biopsy site: LN Reason for request:  Multifocal tracer avid left retroperitoneal lymph nodes compatible with nodal metastasis. Imaging review: Best seen on PET 07/25/22 Im 128 Se 4  Decision: Approved Imaging modality to perform: CT Schedule with: Moderate Sedation Schedule for: Any VIR  Additional comments: @VIR : prone  Please contact me with questions, concerns, or if issue pertaining to this request arise.  Dayne Oley Balm, MD [facility]

## 2022-08-15 NOTE — Assessment & Plan Note (Signed)
Regular heart rate and rhythm  -follow up with cardiology as scheduled

## 2022-08-15 NOTE — Assessment & Plan Note (Addendum)
Patient followed by oncology -PET scan done yesterday, though results are not yet available.

## 2022-08-15 NOTE — Assessment & Plan Note (Signed)
Mass of left kidney growing.  -PET scan yesterday.  -discuss with oncology at next visit.

## 2022-08-15 NOTE — Assessment & Plan Note (Signed)
Continue nasal cannula oxygen use as prescribed.  

## 2022-08-15 NOTE — Assessment & Plan Note (Signed)
Blood pressure improved and stable.  -continue medication as prescribed  -reassess in 3 months

## 2022-08-21 ENCOUNTER — Telehealth: Payer: Self-pay | Admitting: Internal Medicine

## 2022-08-21 DIAGNOSIS — J9611 Chronic respiratory failure with hypoxia: Secondary | ICD-10-CM

## 2022-08-21 NOTE — Telephone Encounter (Signed)
Called patient but she did not answer. Left message for her to call us back.   Dr. Sherene Sires, are you ok with [place] sending an order to [employer] to supply her with tanks while she is in [place]?

## 2022-08-21 NOTE — Telephone Encounter (Signed)
Patient is returning phone call. Patient phone number is [phone]/

## 2022-08-21 NOTE — Telephone Encounter (Addendum)
Patient called the [facility] line and left a VM. Called pt back, pt wanted to confirm her appt with oncology for 7/8, I did verify these appts with pt.   While on the phone pt states they are currently in [place] and using an oxygen concentrator. However, the 5 tanks they brought to [facility] are either out or running low. Patient states she doubts there is enough oxygen to last the car ride home on Sunday 7/6. Pt is requesting we call [employer] to either fill or replace tanks for their car ride home. Patient's current DME is [employer].   Will forward to triage to call [employer] to help facilitate.

## 2022-08-21 NOTE — Telephone Encounter (Signed)
Spoke with the pt and notified of response per Dr. Sherene Sires- she states [employer] was unable to help and wants order sent to [facility]. Urgent referral placed.

## 2022-08-21 NOTE — Telephone Encounter (Signed)
Pt. Leaving Sunday to come back to [place] from [facility] and they need a script for emergency O2 to be sent to [facility] med. Equ. In [place] [phone] but husband stated he possibly will not have enough O2 to get her home

## 2022-08-21 NOTE — Telephone Encounter (Signed)
Yes that's fine but most companies have travel contingencies for this purpose, check with dme company

## 2022-08-23 ENCOUNTER — Encounter: Payer: Self-pay | Admitting: Family Medicine

## 2022-08-23 ENCOUNTER — Telehealth: Payer: Self-pay | Admitting: Medical Oncology

## 2022-08-23 NOTE — Telephone Encounter (Signed)
I called [employer] and spoke to [place].  She states they need an O2 Travel Form from [employer] and then they can provide the patient with whatever she needs and they will then bill [employer]. They will contact the patient once they get the form.  Their fax # is [phone].  I called [employer] and spoke to [facility].  She states she will fill out the form and send to them.  I gave her their phone and fax #.  I called the patient and made her aware of the process.  Nothing further needed.

## 2022-08-23 NOTE — Telephone Encounter (Signed)
Pt is travelling in [place] with family. She is experiencing weakness and episodes of her oxygen sat dropping  to low 70's when she walks to bathroom ON 5 liters oxygen. .She has disease progression and is scheduled for a biopsy next week.    Son is asking if she should come back to [place]. I recommended she come back to [facility] for medical care.

## 2022-08-23 NOTE — Progress Notes (Signed)
Attempted to call patient for SDW call for bronchoscopy on Tuesday 7/9/20024 with Dr. Delton Coombes.  Patient states she is in [place] and oxygen sat has been falling when she attempts to stand. States she has an appointment on Monday 09/02/2022 at 1330 and will know afterwards if surgery is to continue.

## 2022-08-24 DIAGNOSIS — N390 Urinary tract infection, site not specified: Secondary | ICD-10-CM | POA: Diagnosis not present

## 2022-08-26 ENCOUNTER — Inpatient Hospital Stay (HOSPITAL_COMMUNITY): Payer: Medicare HMO

## 2022-08-26 ENCOUNTER — Inpatient Hospital Stay (HOSPITAL_BASED_OUTPATIENT_CLINIC_OR_DEPARTMENT_OTHER): Payer: Medicare HMO | Admitting: Internal Medicine

## 2022-08-26 ENCOUNTER — Inpatient Hospital Stay: Payer: Medicare HMO | Attending: Internal Medicine

## 2022-08-26 ENCOUNTER — Emergency Department (HOSPITAL_COMMUNITY): Payer: Medicare HMO

## 2022-08-26 ENCOUNTER — Telehealth: Payer: Self-pay | Admitting: Medical Oncology

## 2022-08-26 ENCOUNTER — Inpatient Hospital Stay (HOSPITAL_COMMUNITY): Payer: Medicare HMO | Admitting: Physician Assistant

## 2022-08-26 ENCOUNTER — Other Ambulatory Visit: Payer: Self-pay

## 2022-08-26 ENCOUNTER — Encounter (HOSPITAL_COMMUNITY): Payer: Self-pay | Admitting: Emergency Medicine

## 2022-08-26 ENCOUNTER — Inpatient Hospital Stay (HOSPITAL_COMMUNITY)
Admission: EM | Admit: 2022-08-26 | Discharge: 2022-09-19 | DRG: 682 | Disposition: E | Payer: Medicare HMO | Attending: Internal Medicine | Admitting: Internal Medicine

## 2022-08-26 ENCOUNTER — Encounter: Payer: Self-pay | Admitting: *Deleted

## 2022-08-26 VITALS — BP 114/60 | HR 75 | Temp 97.6°F | Resp 18 | Ht 64.0 in

## 2022-08-26 DIAGNOSIS — G4733 Obstructive sleep apnea (adult) (pediatric): Secondary | ICD-10-CM | POA: Diagnosis present

## 2022-08-26 DIAGNOSIS — J440 Chronic obstructive pulmonary disease with acute lower respiratory infection: Secondary | ICD-10-CM | POA: Diagnosis not present

## 2022-08-26 DIAGNOSIS — J9811 Atelectasis: Secondary | ICD-10-CM | POA: Diagnosis present

## 2022-08-26 DIAGNOSIS — J9621 Acute and chronic respiratory failure with hypoxia: Secondary | ICD-10-CM | POA: Diagnosis present

## 2022-08-26 DIAGNOSIS — Z6836 Body mass index (BMI) 36.0-36.9, adult: Secondary | ICD-10-CM

## 2022-08-26 DIAGNOSIS — I2583 Coronary atherosclerosis due to lipid rich plaque: Secondary | ICD-10-CM

## 2022-08-26 DIAGNOSIS — J910 Malignant pleural effusion: Secondary | ICD-10-CM | POA: Diagnosis not present

## 2022-08-26 DIAGNOSIS — Z902 Acquired absence of lung [part of]: Secondary | ICD-10-CM

## 2022-08-26 DIAGNOSIS — R918 Other nonspecific abnormal finding of lung field: Secondary | ICD-10-CM | POA: Diagnosis not present

## 2022-08-26 DIAGNOSIS — C3490 Malignant neoplasm of unspecified part of unspecified bronchus or lung: Secondary | ICD-10-CM | POA: Diagnosis not present

## 2022-08-26 DIAGNOSIS — C3492 Malignant neoplasm of unspecified part of left bronchus or lung: Secondary | ICD-10-CM

## 2022-08-26 DIAGNOSIS — I132 Hypertensive heart and chronic kidney disease with heart failure and with stage 5 chronic kidney disease, or end stage renal disease: Secondary | ICD-10-CM | POA: Diagnosis not present

## 2022-08-26 DIAGNOSIS — N186 End stage renal disease: Secondary | ICD-10-CM | POA: Diagnosis present

## 2022-08-26 DIAGNOSIS — Z7951 Long term (current) use of inhaled steroids: Secondary | ICD-10-CM

## 2022-08-26 DIAGNOSIS — J432 Centrilobular emphysema: Secondary | ICD-10-CM | POA: Diagnosis not present

## 2022-08-26 DIAGNOSIS — E872 Acidosis, unspecified: Secondary | ICD-10-CM | POA: Diagnosis not present

## 2022-08-26 DIAGNOSIS — Z66 Do not resuscitate: Secondary | ICD-10-CM | POA: Diagnosis present

## 2022-08-26 DIAGNOSIS — J189 Pneumonia, unspecified organism: Secondary | ICD-10-CM | POA: Diagnosis not present

## 2022-08-26 DIAGNOSIS — R7401 Elevation of levels of liver transaminase levels: Secondary | ICD-10-CM | POA: Diagnosis present

## 2022-08-26 DIAGNOSIS — C7901 Secondary malignant neoplasm of right kidney and renal pelvis: Secondary | ICD-10-CM | POA: Diagnosis not present

## 2022-08-26 DIAGNOSIS — E875 Hyperkalemia: Secondary | ICD-10-CM | POA: Diagnosis present

## 2022-08-26 DIAGNOSIS — C7902 Secondary malignant neoplasm of left kidney and renal pelvis: Secondary | ICD-10-CM | POA: Diagnosis present

## 2022-08-26 DIAGNOSIS — I2510 Atherosclerotic heart disease of native coronary artery without angina pectoris: Secondary | ICD-10-CM | POA: Diagnosis not present

## 2022-08-26 DIAGNOSIS — K7689 Other specified diseases of liver: Secondary | ICD-10-CM | POA: Diagnosis present

## 2022-08-26 DIAGNOSIS — N2889 Other specified disorders of kidney and ureter: Secondary | ICD-10-CM | POA: Diagnosis present

## 2022-08-26 DIAGNOSIS — J9601 Acute respiratory failure with hypoxia: Secondary | ICD-10-CM | POA: Diagnosis not present

## 2022-08-26 DIAGNOSIS — N179 Acute kidney failure, unspecified: Secondary | ICD-10-CM | POA: Diagnosis not present

## 2022-08-26 DIAGNOSIS — R748 Abnormal levels of other serum enzymes: Secondary | ICD-10-CM

## 2022-08-26 DIAGNOSIS — E782 Mixed hyperlipidemia: Secondary | ICD-10-CM | POA: Diagnosis present

## 2022-08-26 DIAGNOSIS — Z923 Personal history of irradiation: Secondary | ICD-10-CM

## 2022-08-26 DIAGNOSIS — I10 Essential (primary) hypertension: Secondary | ICD-10-CM

## 2022-08-26 DIAGNOSIS — R0902 Hypoxemia: Secondary | ICD-10-CM | POA: Diagnosis not present

## 2022-08-26 DIAGNOSIS — Z951 Presence of aortocoronary bypass graft: Secondary | ICD-10-CM

## 2022-08-26 DIAGNOSIS — D630 Anemia in neoplastic disease: Secondary | ICD-10-CM | POA: Diagnosis present

## 2022-08-26 DIAGNOSIS — N1330 Unspecified hydronephrosis: Secondary | ICD-10-CM | POA: Diagnosis not present

## 2022-08-26 DIAGNOSIS — R609 Edema, unspecified: Secondary | ICD-10-CM | POA: Diagnosis not present

## 2022-08-26 DIAGNOSIS — E8809 Other disorders of plasma-protein metabolism, not elsewhere classified: Secondary | ICD-10-CM | POA: Diagnosis not present

## 2022-08-26 DIAGNOSIS — Z515 Encounter for palliative care: Secondary | ICD-10-CM | POA: Diagnosis not present

## 2022-08-26 DIAGNOSIS — D49511 Neoplasm of unspecified behavior of right kidney: Secondary | ICD-10-CM | POA: Diagnosis not present

## 2022-08-26 DIAGNOSIS — K219 Gastro-esophageal reflux disease without esophagitis: Secondary | ICD-10-CM | POA: Diagnosis present

## 2022-08-26 DIAGNOSIS — Z7982 Long term (current) use of aspirin: Secondary | ICD-10-CM

## 2022-08-26 DIAGNOSIS — E039 Hypothyroidism, unspecified: Secondary | ICD-10-CM | POA: Diagnosis not present

## 2022-08-26 DIAGNOSIS — I5032 Chronic diastolic (congestive) heart failure: Secondary | ICD-10-CM | POA: Diagnosis not present

## 2022-08-26 DIAGNOSIS — N170 Acute kidney failure with tubular necrosis: Secondary | ICD-10-CM | POA: Diagnosis not present

## 2022-08-26 DIAGNOSIS — J9622 Acute and chronic respiratory failure with hypercapnia: Secondary | ICD-10-CM | POA: Diagnosis present

## 2022-08-26 DIAGNOSIS — L89152 Pressure ulcer of sacral region, stage 2: Secondary | ICD-10-CM | POA: Diagnosis present

## 2022-08-26 DIAGNOSIS — I480 Paroxysmal atrial fibrillation: Secondary | ICD-10-CM | POA: Diagnosis not present

## 2022-08-26 DIAGNOSIS — E669 Obesity, unspecified: Secondary | ICD-10-CM | POA: Diagnosis not present

## 2022-08-26 DIAGNOSIS — K8020 Calculus of gallbladder without cholecystitis without obstruction: Secondary | ICD-10-CM | POA: Diagnosis not present

## 2022-08-26 DIAGNOSIS — D649 Anemia, unspecified: Secondary | ICD-10-CM | POA: Diagnosis present

## 2022-08-26 DIAGNOSIS — Z743 Need for continuous supervision: Secondary | ICD-10-CM | POA: Diagnosis not present

## 2022-08-26 DIAGNOSIS — Z9981 Dependence on supplemental oxygen: Secondary | ICD-10-CM

## 2022-08-26 DIAGNOSIS — I252 Old myocardial infarction: Secondary | ICD-10-CM

## 2022-08-26 DIAGNOSIS — I509 Heart failure, unspecified: Secondary | ICD-10-CM

## 2022-08-26 DIAGNOSIS — R7989 Other specified abnormal findings of blood chemistry: Secondary | ICD-10-CM | POA: Diagnosis not present

## 2022-08-26 DIAGNOSIS — G9341 Metabolic encephalopathy: Secondary | ICD-10-CM | POA: Diagnosis not present

## 2022-08-26 DIAGNOSIS — J439 Emphysema, unspecified: Secondary | ICD-10-CM | POA: Diagnosis not present

## 2022-08-26 DIAGNOSIS — Z79890 Hormone replacement therapy (postmenopausal): Secondary | ICD-10-CM

## 2022-08-26 DIAGNOSIS — N390 Urinary tract infection, site not specified: Secondary | ICD-10-CM | POA: Diagnosis present

## 2022-08-26 DIAGNOSIS — Z9221 Personal history of antineoplastic chemotherapy: Secondary | ICD-10-CM

## 2022-08-26 DIAGNOSIS — R590 Localized enlarged lymph nodes: Secondary | ICD-10-CM | POA: Diagnosis not present

## 2022-08-26 DIAGNOSIS — Z87891 Personal history of nicotine dependence: Secondary | ICD-10-CM

## 2022-08-26 DIAGNOSIS — Z1152 Encounter for screening for COVID-19: Secondary | ICD-10-CM | POA: Diagnosis not present

## 2022-08-26 DIAGNOSIS — K769 Liver disease, unspecified: Secondary | ICD-10-CM | POA: Diagnosis not present

## 2022-08-26 DIAGNOSIS — Z8249 Family history of ischemic heart disease and other diseases of the circulatory system: Secondary | ICD-10-CM

## 2022-08-26 DIAGNOSIS — J90 Pleural effusion, not elsewhere classified: Secondary | ICD-10-CM | POA: Diagnosis present

## 2022-08-26 DIAGNOSIS — R7889 Finding of other specified substances, not normally found in blood: Secondary | ICD-10-CM | POA: Diagnosis not present

## 2022-08-26 DIAGNOSIS — D49512 Neoplasm of unspecified behavior of left kidney: Secondary | ICD-10-CM | POA: Diagnosis not present

## 2022-08-26 DIAGNOSIS — Z823 Family history of stroke: Secondary | ICD-10-CM

## 2022-08-26 LAB — CBC WITH DIFFERENTIAL/PLATELET
Abs Immature Granulocytes: 0.5 10*3/uL — ABNORMAL HIGH (ref 0.00–0.07)
Abs Immature Granulocytes: 0.38 10*3/uL — ABNORMAL HIGH (ref 0.00–0.07)
Basophils Absolute: 0.1 10*3/uL (ref 0.0–0.1)
Basophils Absolute: 0.1 10*3/uL (ref 0.0–0.1)
Basophils Relative: 0 %
Basophils Relative: 0 %
Eosinophils Absolute: 0.2 10*3/uL (ref 0.0–0.5)
Eosinophils Absolute: 0.2 10*3/uL (ref 0.0–0.5)
Eosinophils Relative: 1 %
Eosinophils Relative: 1 %
HCT: 25 % — ABNORMAL LOW (ref 36.0–46.0)
HCT: 25 % — ABNORMAL LOW (ref 36.0–46.0)
Hemoglobin: 7.3 g/dL — ABNORMAL LOW (ref 12.0–15.0)
Hemoglobin: 7.3 g/dL — ABNORMAL LOW (ref 12.0–15.0)
Immature Granulocytes: 3 %
Immature Granulocytes: 2 %
Lymphocytes Relative: 5 %
Lymphocytes Relative: 6 %
Lymphs Abs: 0.9 10*3/uL (ref 0.7–4.0)
Lymphs Abs: 1 10*3/uL (ref 0.7–4.0)
MCH: 23.8 pg — ABNORMAL LOW (ref 26.0–34.0)
MCH: 24 pg — ABNORMAL LOW (ref 26.0–34.0)
MCHC: 29.2 g/dL — ABNORMAL LOW (ref 30.0–36.0)
MCHC: 29.2 g/dL — ABNORMAL LOW (ref 30.0–36.0)
MCV: 81.4 fL (ref 80.0–100.0)
MCV: 82.2 fL (ref 80.0–100.0)
Monocytes Absolute: 1.8 10*3/uL — ABNORMAL HIGH (ref 0.1–1.0)
Monocytes Absolute: 1.7 10*3/uL — ABNORMAL HIGH (ref 0.1–1.0)
Monocytes Relative: 10 %
Monocytes Relative: 9 %
Neutro Abs: 15.1 10*3/uL — ABNORMAL HIGH (ref 1.7–7.7)
Neutro Abs: 15 10*3/uL — ABNORMAL HIGH (ref 1.7–7.7)
Neutrophils Relative %: 81 %
Neutrophils Relative %: 82 %
Platelets: 435 10*3/uL — ABNORMAL HIGH (ref 150–400)
Platelets: 424 10*3/uL — ABNORMAL HIGH (ref 150–400)
RBC: 3.07 MIL/uL — ABNORMAL LOW (ref 3.87–5.11)
RBC: 3.04 MIL/uL — ABNORMAL LOW (ref 3.87–5.11)
RDW: 20.3 % — ABNORMAL HIGH (ref 11.5–15.5)
RDW: 20.4 % — ABNORMAL HIGH (ref 11.5–15.5)
WBC: 18.5 10*3/uL — ABNORMAL HIGH (ref 4.0–10.5)
WBC: 18.3 10*3/uL — ABNORMAL HIGH (ref 4.0–10.5)
nRBC: 0 % (ref 0.0–0.2)
nRBC: 0 % (ref 0.0–0.2)

## 2022-08-26 LAB — VITAMIN B12: Vitamin B-12: 995 pg/mL — ABNORMAL HIGH (ref 180–914)

## 2022-08-26 LAB — IRON AND TIBC
Iron: 18 ug/dL — ABNORMAL LOW (ref 28–170)
Saturation Ratios: 7 % — ABNORMAL LOW (ref 10.4–31.8)
TIBC: 259 ug/dL (ref 250–450)
UIBC: 241 ug/dL

## 2022-08-26 LAB — RENAL FUNCTION PANEL
Albumin: 2 g/dL — ABNORMAL LOW (ref 3.5–5.0)
Anion gap: 11 (ref 5–15)
BUN: 115 mg/dL — ABNORMAL HIGH (ref 8–23)
CO2: 22 mmol/L (ref 22–32)
Calcium: 8 mg/dL — ABNORMAL LOW (ref 8.9–10.3)
Chloride: 103 mmol/L (ref 98–111)
Creatinine, Ser: 6.17 mg/dL — ABNORMAL HIGH (ref 0.44–1.00)
GFR, Estimated: 7 mL/min — ABNORMAL LOW (ref >60)
Glucose, Bld: 106 mg/dL — ABNORMAL HIGH (ref 70–99)
Phosphorus: 7.8 mg/dL — ABNORMAL HIGH (ref 2.5–4.6)
Potassium: 4.7 mmol/L (ref 3.5–5.1)
Sodium: 136 mmol/L (ref 135–145)

## 2022-08-26 LAB — URINALYSIS, ROUTINE W REFLEX MICROSCOPIC
Bilirubin Urine: NEGATIVE
Glucose, UA: NEGATIVE mg/dL
Ketones, ur: NEGATIVE mg/dL
Leukocytes,Ua: NEGATIVE
Nitrite: NEGATIVE
Protein, ur: 30 mg/dL — AB
Specific Gravity, Urine: 1.008 (ref 1.005–1.030)
pH: 5 (ref 5.0–8.0)

## 2022-08-26 LAB — PROTIME-INR
INR: 1.3 — ABNORMAL HIGH (ref 0.8–1.2)
Prothrombin Time: 15.9 seconds — ABNORMAL HIGH (ref 11.4–15.2)

## 2022-08-26 LAB — LACTATE DEHYDROGENASE: LDH: 348 U/L — ABNORMAL HIGH (ref 98–192)

## 2022-08-26 LAB — CBC WITH DIFFERENTIAL (CANCER CENTER ONLY)
Abs Immature Granulocytes: 0.39 10*3/uL — ABNORMAL HIGH (ref 0.00–0.07)
Basophils Absolute: 0.1 10*3/uL (ref 0.0–0.1)
Basophils Relative: 0 %
Eosinophils Absolute: 0.2 10*3/uL (ref 0.0–0.5)
Eosinophils Relative: 1 %
HCT: 26 % — ABNORMAL LOW (ref 36.0–46.0)
Hemoglobin: 7.7 g/dL — ABNORMAL LOW (ref 12.0–15.0)
Immature Granulocytes: 2 %
Lymphocytes Relative: 5 %
Lymphs Abs: 0.9 10*3/uL (ref 0.7–4.0)
MCH: 24.2 pg — ABNORMAL LOW (ref 26.0–34.0)
MCHC: 29.6 g/dL — ABNORMAL LOW (ref 30.0–36.0)
MCV: 81.8 fL (ref 80.0–100.0)
Monocytes Absolute: 1.7 10*3/uL — ABNORMAL HIGH (ref 0.1–1.0)
Monocytes Relative: 10 %
Neutro Abs: 13.6 10*3/uL — ABNORMAL HIGH (ref 1.7–7.7)
Neutrophils Relative %: 82 %
Platelet Count: 434 10*3/uL — ABNORMAL HIGH (ref 150–400)
RBC: 3.18 MIL/uL — ABNORMAL LOW (ref 3.87–5.11)
RDW: 19.9 % — ABNORMAL HIGH (ref 11.5–15.5)
WBC Count: 16.9 10*3/uL — ABNORMAL HIGH (ref 4.0–10.5)
nRBC: 0 % (ref 0.0–0.2)

## 2022-08-26 LAB — CMP (CANCER CENTER ONLY)
ALT: 205 U/L — ABNORMAL HIGH (ref 0–44)
AST: 208 U/L (ref 15–41)
Albumin: 2.6 g/dL — ABNORMAL LOW (ref 3.5–5.0)
Alkaline Phosphatase: 147 U/L — ABNORMAL HIGH (ref 38–126)
Anion gap: 13 (ref 5–15)
BUN: 111 mg/dL — ABNORMAL HIGH (ref 8–23)
CO2: 25 mmol/L (ref 22–32)
Calcium: 8.8 mg/dL — ABNORMAL LOW (ref 8.9–10.3)
Chloride: 102 mmol/L (ref 98–111)
Creatinine: 6.66 mg/dL — ABNORMAL HIGH (ref 0.44–1.00)
GFR, Estimated: 6 mL/min — ABNORMAL LOW (ref >60)
Glucose, Bld: 79 mg/dL (ref 70–99)
Potassium: 5.2 mmol/L — ABNORMAL HIGH (ref 3.5–5.1)
Sodium: 140 mmol/L (ref 135–145)
Total Bilirubin: 0.3 mg/dL (ref 0.3–1.2)
Total Protein: 6.8 g/dL (ref 6.5–8.1)

## 2022-08-26 LAB — TSH
TSH: 12.388 u[IU]/mL — ABNORMAL HIGH (ref 0.350–4.500)
TSH: 9.165 u[IU]/mL — ABNORMAL HIGH (ref 0.350–4.500)

## 2022-08-26 LAB — RETICULOCYTES
Immature Retic Fract: 36.4 % — ABNORMAL HIGH (ref 2.3–15.9)
RBC.: 3.11 MIL/uL — ABNORMAL LOW (ref 3.87–5.11)
Retic Count, Absolute: 76.8 10*3/uL (ref 19.0–186.0)
Retic Ct Pct: 2.5 % (ref 0.4–3.1)

## 2022-08-26 LAB — URIC ACID: Uric Acid, Serum: 7.3 mg/dL — ABNORMAL HIGH (ref 2.5–7.1)

## 2022-08-26 LAB — PROCALCITONIN: Procalcitonin: 0.49 ng/mL

## 2022-08-26 LAB — FERRITIN: Ferritin: 402 ng/mL — ABNORMAL HIGH (ref 11–307)

## 2022-08-26 LAB — CK: Total CK: 1956 U/L — ABNORMAL HIGH (ref 38–234)

## 2022-08-26 LAB — FOLATE: Folate: 6.4 ng/mL (ref >5.9)

## 2022-08-26 LAB — TYPE AND SCREEN: ABO/RH(D): O NEG

## 2022-08-26 MED ORDER — ONDANSETRON HCL 4 MG/2ML IJ SOLN
4.0000 mg | Freq: Four times a day (QID) | INTRAMUSCULAR | Status: DC | PRN
  Administered 2022-08-26: 4 mg via INTRAVENOUS
  Filled 2022-08-26: qty 2

## 2022-08-26 MED ORDER — ACETAMINOPHEN 325 MG PO TABS: 650.0000 mg | ORAL_TABLET | Freq: Four times a day (QID) | ORAL | Status: DC | PRN

## 2022-08-26 MED ORDER — LEVOTHYROXINE SODIUM 50 MCG PO TABS: 75.0000 ug | ORAL_TABLET | Freq: Every day | ORAL | Status: DC

## 2022-08-26 MED ORDER — PIPERACILLIN-TAZOBACTAM IN DEX 2-0.25 GM/50ML IV SOLN
2.2500 g | Freq: Four times a day (QID) | INTRAVENOUS | Status: DC
  Administered 2022-08-27 – 2022-08-28 (×6): 2.25 g via INTRAVENOUS
  Filled 2022-08-26 (×8): qty 50

## 2022-08-26 MED ORDER — SODIUM CHLORIDE 0.9% IV SOLUTION: Freq: Once | INTRAVENOUS | Status: AC

## 2022-08-26 MED ORDER — ONDANSETRON HCL 4 MG PO TABS: 4.0000 mg | ORAL_TABLET | Freq: Four times a day (QID) | ORAL | Status: DC | PRN

## 2022-08-26 MED ORDER — LEVOTHYROXINE SODIUM 75 MCG PO TABS
75.0000 ug | ORAL_TABLET | Freq: Every day | ORAL | Status: DC
  Administered 2022-08-27 – 2022-08-28 (×2): 75 ug via ORAL
  Filled 2022-08-26 (×2): qty 1

## 2022-08-26 MED ORDER — ARFORMOTEROL TARTRATE 15 MCG/2ML IN NEBU
15.0000 ug | INHALATION_SOLUTION | Freq: Every day | RESPIRATORY_TRACT | Status: DC
  Administered 2022-08-26 – 2022-08-28 (×3): 15 ug via RESPIRATORY_TRACT
  Filled 2022-08-26 (×3): qty 2

## 2022-08-26 MED ORDER — GUAIFENESIN ER 600 MG PO TB12
600.0000 mg | ORAL_TABLET | Freq: Two times a day (BID) | ORAL | Status: DC
  Administered 2022-08-26 – 2022-08-28 (×3): 600 mg via ORAL
  Filled 2022-08-26 (×4): qty 1

## 2022-08-26 MED ORDER — HYDROCODONE-ACETAMINOPHEN 5-325 MG PO TABS
1.0000 | ORAL_TABLET | ORAL | Status: DC | PRN
  Administered 2022-08-26: 1 via ORAL
  Filled 2022-08-26: qty 1

## 2022-08-26 MED ORDER — ACETAMINOPHEN 650 MG RE SUPP: 650.0000 mg | Freq: Four times a day (QID) | RECTAL | Status: DC | PRN

## 2022-08-26 MED ORDER — ALBUMIN HUMAN 25 % IV SOLN
12.5000 g | Freq: Once | INTRAVENOUS | Status: AC
  Administered 2022-08-26: 12.5 g via INTRAVENOUS
  Filled 2022-08-26: qty 50

## 2022-08-26 MED ORDER — SODIUM CHLORIDE 0.9 % IV BOLUS
1000.0000 mL | Freq: Once | INTRAVENOUS | Status: AC
  Administered 2022-08-26: 1000 mL via INTRAVENOUS

## 2022-08-26 MED ORDER — ALBUTEROL SULFATE (2.5 MG/3ML) 0.083% IN NEBU: 2.5000 mg | INHALATION_SOLUTION | RESPIRATORY_TRACT | Status: DC | PRN

## 2022-08-26 MED ORDER — SODIUM CHLORIDE 0.9 % IV SOLN: INTRAVENOUS | Status: DC

## 2022-08-26 MED ORDER — BUDESONIDE 0.5 MG/2ML IN SUSP
0.5000 mg | Freq: Two times a day (BID) | RESPIRATORY_TRACT | Status: DC
  Administered 2022-08-26 – 2022-08-28 (×4): 0.5 mg via RESPIRATORY_TRACT
  Filled 2022-08-26 (×4): qty 2

## 2022-08-26 NOTE — ED Provider Notes (Signed)
[place] EMERGENCY DEPARTMENT AT [facility] Provider Note   CSN: [id number] Arrival date & time: 09/14/2022  1724     History  Chief Complaint  Patient presents with   Fatigue    [name] is a 77 y.o. female.  HPI Patient presents for abnormal lab work results.  Medical history includes CAD, COPD, HLD, GERD, HTN, lung cancer, atrial fibrillation.  She underwent lung resection in 2012.  She has been on chemotherapy up until December of last year.  At that time, she was diagnosed with atrial fibrillation.  She was started on amiodarone but suffered [place] fibrosis.  She is no longer on amiodarone.  Since then, she has been on 4 to 5 L of supplemental oxygen at baseline.  She states that her breathing currently feels normal to her.  She had recent imaging findings concerning of lung cancer recurrence/worsening.  She met with her oncologist, Dr. Arbutus Ped today for preoperative evaluation for planned biopsy tomorrow.  While at her outpatient visit today, they did check lab work.  Results of lab work were notable for acute renal failure, mild hyperkalemia, azotemia, leukocytosis, and acute on chronic anemia.  Patient was informed of lab results and advised to come to the ED.  Home medications include Crestor, potassium supplement, Naprosyn, Lasix, Synthroid.  She also reports that she was in [place] last week and was diagnosed with a UTI.  She has been on an antibiotic for the past 2 days.  She does not recall the name of that antibiotic.  She states that she uses Aleve only occasionally.    Home Medications Prior to Admission medications   Medication Sig Start Date End Date Taking? Authorizing Provider  acetaminophen (TYLENOL) 500 MG tablet Take 500 mg by mouth every 6 (six) hours as needed for headache or fever.   Yes [provider]  arformoterol (BROVANA) 15 MCG/2ML NEBU Take 2 mLs (15 mcg total) by nebulization 2 (two) times daily. Patient taking  differently: Take 15 mcg by nebulization daily. 03/13/22  Yes Nyoka Cowden, MD  aspirin 81 MG tablet Take 81 mg by mouth at bedtime.   Yes [provider]  budesonide (PULMICORT) 0.5 MG/2ML nebulizer solution Take 2 mLs (0.5 mg total) by nebulization 2 (two) times daily. 05/24/22  Yes Nyoka Cowden, MD  cephALEXin (KEFLEX) 500 MG capsule Take 500 mg by mouth 2 (two) times daily. 08/24/22  Yes [provider]  Coenzyme Q10 (CO Q 10 PO) Take 1 capsule by mouth daily.   Yes [provider]  furosemide (LASIX) 20 MG tablet Take 1 tablet (20 mg total) by mouth daily. 07/29/22  Yes Boscia, Kathlynn Grate, NP  levothyroxine (SYNTHROID) 25 MCG tablet Take 0.5 tablets (12.5 mcg total) by mouth daily. In addition to the 50 mcg. 07/29/22  Yes Boscia, Kathlynn Grate, NP  levothyroxine (SYNTHROID) 50 MCG tablet TAKE 1 TABLET BY MOUTH DAILY ALONG WITH 12.5 MCG EXCEPT ON SUNDAY TAKE 50 MCG Patient taking differently: Take 50 mcg by mouth as directed. 05/01/22  Yes [name], NP  lidocaine-prilocaine (EMLA) cream Apply 1 Application topically as needed. Patient taking differently: Apply 1 Application topically as needed (pain). 08/06/21  Yes Heilingoetter, Cassandra L, PA-C  metoprolol succinate (TOPROL-XL) 100 MG 24 hr tablet TAKE 1 TABLET BY MOUTH DAILY. TAKE WITH OR IMMEDIATELY FOLLOWING A MEAL. Patient taking differently: Take 50 mg by mouth daily. TAKE WITH OR IMMEDIATELY FOLLOWING A MEAL. 08/09/22  Yes Boscia, Kathlynn Grate,  NP  naproxen sodium (ALEVE) 220 MG tablet Take 220 mg by mouth 2 (two) times daily as needed (pain).   Yes [provider]  Omega-3 Fatty Acids (OMEGA-3 PO) Take 1,000 mg by mouth in the morning and at bedtime.   Yes [provider]  potassium chloride (KLOR-CON M10) 10 MEQ tablet Take 1 tablet (10 mEq total) by mouth daily. Patient taking differently: Take 10 mEq by mouth every Monday, Wednesday, and Friday. 07/29/22  Yes [name], NP   revefenacin (YUPELRI) 175 MCG/3ML nebulizer solution INHALE VIA NEBULIZER DAILY 08/07/22  Yes Nyoka Cowden, MD  rosuvastatin (CRESTOR) 40 MG tablet Take 1 tablet (40 mg total) by mouth daily. 09/27/21  Yes [name], NP      Allergies    Amiodarone and Codeine    Review of Systems   Review of Systems  Constitutional:  Positive for fatigue (Chronic).  Respiratory:  Positive for shortness of breath (Chronic).   All other systems reviewed and are negative.   Physical Exam Updated Vital Signs BP (!) 115/56 (BP Location: Right Arm)   Pulse 77   Temp 98 F (36.7 C) (Oral)   Resp 18   Wt 96 kg   SpO2 98%   BMI 36.33 kg/m  Physical Exam Vitals and nursing note reviewed.  Constitutional:      General: She is not in acute distress.    Appearance: Normal appearance. She is well-developed. She is not ill-appearing, toxic-appearing or diaphoretic.  HENT:     Head: Normocephalic and atraumatic.     Right Ear: External ear normal.     Left Ear: External ear normal.     Nose: Nose normal.     Mouth/Throat:     Mouth: Mucous membranes are moist.  Eyes:     Extraocular Movements: Extraocular movements intact.     Conjunctiva/sclera: Conjunctivae normal.  Cardiovascular:     Rate and Rhythm: Normal rate and regular rhythm.     Heart sounds: No murmur heard. Pulmonary:     Effort: Pulmonary effort is normal. No respiratory distress.     Breath sounds: Wheezing present. No rhonchi or rales.  Chest:     Chest wall: No tenderness.  Abdominal:     General: There is no distension.     Palpations: Abdomen is soft.     Tenderness: There is no abdominal tenderness. There is no right CVA tenderness or left CVA tenderness.  Musculoskeletal:        General: No swelling. Normal range of motion.     Cervical back: Normal range of motion and neck supple.     Right lower leg: Edema present.     Left lower leg: Edema present.  Skin:    General: Skin is warm and dry.      Coloration: Skin is not jaundiced or pale.  Neurological:     General: No focal deficit present.     Mental Status: She is alert and oriented to person, place, and time.  Psychiatric:        Mood and Affect: Mood normal.        Behavior: Behavior normal.        Thought Content: Thought content normal.        Judgment: Judgment normal.     ED Results / Procedures / Treatments   Labs (all labs ordered are listed, but only abnormal results are displayed) Labs Reviewed  RENAL FUNCTION PANEL - Abnormal; Notable for the  following components:      Result Value   Glucose, Bld 106 (*)    BUN 115 (*)    Creatinine, Ser 6.17 (*)    Calcium 8.0 (*)    Phosphorus 7.8 (*)    Albumin 2.0 (*)    GFR, Estimated 7 (*)    All other components within normal limits  CBC WITH DIFFERENTIAL/PLATELET - Abnormal; Notable for the following components:   WBC 18.5 (*)    RBC 3.07 (*)    Hemoglobin 7.3 (*)    HCT 25.0 (*)    MCH 23.8 (*)    MCHC 29.2 (*)    RDW 20.3 (*)    Platelets 435 (*)    Neutro Abs 15.1 (*)    Monocytes Absolute 1.8 (*)    Abs Immature Granulocytes 0.50 (*)    All other components within normal limits  CK - Abnormal; Notable for the following components:   Total CK 1,956 (*)    All other components within normal limits  URINALYSIS, ROUTINE W REFLEX MICROSCOPIC - Abnormal; Notable for the following components:   APPearance HAZY (*)    Hgb urine dipstick LARGE (*)    Protein, ur 30 (*)    Bacteria, UA MANY (*)    All other components within normal limits  TSH - Abnormal; Notable for the following components:   TSH 9.165 (*)    All other components within normal limits  PROTIME-INR - Abnormal; Notable for the following components:   Prothrombin Time 15.9 (*)    INR 1.3 (*)    All other components within normal limits  VITAMIN B12 - Abnormal; Notable for the following components:   Vitamin B-12 995 (*)    All other components within normal limits  IRON AND TIBC -  Abnormal; Notable for the following components:   Iron 18 (*)    Saturation Ratios 7 (*)    All other components within normal limits  FERRITIN - Abnormal; Notable for the following components:   Ferritin 402 (*)    All other components within normal limits  RETICULOCYTES - Abnormal; Notable for the following components:   RBC. 3.11 (*)    Immature Retic Fract 36.4 (*)    All other components within normal limits  CBC WITH DIFFERENTIAL/PLATELET - Abnormal; Notable for the following components:   WBC 18.3 (*)    RBC 3.04 (*)    Hemoglobin 7.3 (*)    HCT 25.0 (*)    MCH 24.0 (*)    MCHC 29.2 (*)    RDW 20.4 (*)    Platelets 424 (*)    Neutro Abs 15.0 (*)    Monocytes Absolute 1.7 (*)    Abs Immature Granulocytes 0.38 (*)    All other components within normal limits  LACTATE DEHYDROGENASE - Abnormal; Notable for the following components:   LDH 348 (*)    All other components within normal limits  URIC ACID - Abnormal; Notable for the following components:   Uric Acid, Serum 7.3 (*)    All other components within normal limits  EXPECTORATED SPUTUM ASSESSMENT W GRAM STAIN, RFLX TO RESP C  MRSA NEXT GEN BY PCR, NASAL  SARS CORONAVIRUS 2 BY RT PCR  PROCALCITONIN  FOLATE  LACTIC ACID, PLASMA  LACTIC ACID, PLASMA  URINALYSIS, COMPLETE (UACMP) WITH MICROSCOPIC  CREATININE, URINE, RANDOM  OSMOLALITY, URINE  OSMOLALITY  SODIUM, URINE, RANDOM  CBC WITH DIFFERENTIAL/PLATELET  PREALBUMIN  AMMONIA  OCCULT BLOOD X 1 CARD TO LAB,  STOOL  CK  CK  BLOOD GAS, VENOUS  HEPATITIS PANEL, ACUTE  MAGNESIUM  PHOSPHORUS  COMPREHENSIVE METABOLIC PANEL  CBC  LEGIONELLA PNEUMOPHILA SEROGP 1 UR AG  STREP PNEUMONIAE URINARY ANTIGEN  CK  POC OCCULT BLOOD, ED  TYPE AND SCREEN  PREPARE RBC (CROSSMATCH)    EKG None  Radiology DG Chest 2 View  Result Date: 09/07/2022 CLINICAL DATA:  Hypoxia EXAM: CHEST - 2 VIEW COMPARISON:  02/20/2022, PET CT 02/23/2022 FINDINGS: Right-sided central venous  port tip over the cavoatrial region. Post sternotomy changes. Progressive airspace disease and pleural fluid on the left with essentially complete opacification of left thorax. Emphysema. No acute airspace disease on the right. IMPRESSION: Progressive airspace disease and pleural fluid on the left with essentially complete opacification of left thorax. Emphysema. Electronically Signed   By: Jasmine Pang M.D.   On: 09/11/2022 22:14   CT ABDOMEN PELVIS WO CONTRAST  Result Date: 09/16/2022 CLINICAL DATA:  Diffuse abdominal pain. Patient with history of lung cancer. EXAM: CT ABDOMEN AND PELVIS WITHOUT CONTRAST TECHNIQUE: Multidetector CT imaging of the abdomen and pelvis was performed following the standard protocol without IV contrast. RADIATION DOSE REDUCTION: This exam was performed according to the departmental dose-optimization program which includes automated exposure control, adjustment of the mA and/or kV according to patient size and/or use of iterative reconstruction technique. COMPARISON:  PET CT 07/25/2022, chest CT 06/17/2022 abdominal CT 04/25/2022 FINDINGS: Lower chest: Chronic left pleural effusion and basilar opacification. Multiple pulmonary nodules in the right lung base. Largest measures 14 mm series 5, image 20. 9 mm right lower lobe nodule, series 5, image 12. 6 mm pleural based right lower lobe nodule series 5, image 32. These nodules appear increased from prior exams. Baseline fibrotic changes. Hepatobiliary: The low-density liver lesions on prior contrast-enhanced exam are not well-defined in the absence of IV contrast. Unremarkable gallbladder. No biliary dilatation. Pancreas: No ductal dilatation or inflammation. Spleen: Normal in size without focal abnormality. Adrenals/Urinary Tract: No adrenal nodule. Heterogeneous hypodense mass in the medial left kidney measures approximately 5.1 cm, FDG avid on prior PET. There is a 4.2 cm right renal mass in the anterior mid kidney that is increased  in size from prior abdominal CT, series 2, image 31. Bilateral perinephric fat stranding. No hydronephrosis. Calcifications at the renal hila felt to be vascular. Unremarkable urinary bladder. Stomach/Bowel: Detailed bowel assessment is limited in the absence of contrast. The stomach is partially distended, normal for degree of distension. There is no bowel obstruction or inflammation. Normal appendix visualized small to moderate colonic stool burden. Mild sigmoid diverticulosis without diverticulitis. Vascular/Lymphatic: Aortic atherosclerosis. Increasing left retroperitoneal nodal tissue it is now contiguous with the left psoas muscle. This is poorly defined in the absence of IV contrast, however measures approximately 3.5 x 2.6 cm series 2, image 39. Additional left retroperitoneal nodes at the level of the left kidney measuring 11 and 12 mm. Reproductive: Soft tissue thickening in the left vaginal apex series 2, image 79 corresponding to FDG avid lesion on prior PET. This is increased, approximately 3.4 cm series 2, image 79, previously 2.5 cm. Other: Increasing bilateral perinephric stranding. No ascites. No abdominal wall hernia. Musculoskeletal: No suspicious bone lesion. No acute osseous findings. IMPRESSION: 1. Increasing perinephric edema is nonspecific. This may be progressing chronic change or infection. 2. Progressive metastatic disease with increasing left retroperitoneal adenopathy and enlarging right lower lobe pulmonary nodules. 3. Increasing size of bilateral renal masses, favored to be metastatic rather than primary renal  malignancy. 4. Increasing soft tissue associated with the left vaginal apex, likely neoplastic. Aortic Atherosclerosis (ICD10-I70.0). Electronically Signed   By: Narda Rutherford M.D.   On: 08/31/2022 19:48    Procedures Procedures    Medications Ordered in ED Medications  arformoterol (BROVANA) nebulizer solution 15 mcg (15 mcg Nebulization Given 09/11/2022 2253)   budesonide (PULMICORT) nebulizer solution 0.5 mg (0.5 mg Nebulization Given 08/20/2022 2339)  0.9 %  sodium chloride infusion (0 mLs Intravenous Paused 09/14/2022 2349)  acetaminophen (TYLENOL) tablet 650 mg (has no administration in time range)    Or  acetaminophen (TYLENOL) suppository 650 mg (has no administration in time range)  HYDROcodone-acetaminophen (NORCO/VICODIN) 5-325 MG per tablet 1-2 tablet (1 tablet Oral Given 09/10/2022 2254)  ondansetron (ZOFRAN) tablet 4 mg ( Oral See Alternative 09/14/2022 2256)    Or  ondansetron (ZOFRAN) injection 4 mg (4 mg Intravenous Given 08/30/2022 2256)  albuterol (PROVENTIL) (2.5 MG/3ML) 0.083% nebulizer solution 2.5 mg (has no administration in time range)  guaiFENesin (MUCINEX) 12 hr tablet 600 mg (600 mg Oral Given 09/14/2022 2254)  0.9 %  sodium chloride infusion (Manually program via Guardrails IV Fluids) (has no administration in time range)  levothyroxine (SYNTHROID) tablet 75 mcg (has no administration in time range)  piperacillin-tazobactam (ZOSYN) IVPB 2.25 g (has no administration in time range)  sodium chloride 0.9 % bolus 1,000 mL (0 mLs Intravenous Stopped 09/04/2022 2105)  albumin human 25 % solution 12.5 g (12.5 g Intravenous New Bag/Given 09/08/2022 2343)    ED Course/ Medical Decision Making/ A&P                             Medical Decision Making Amount and/or Complexity of Data Reviewed Labs: ordered. Radiology: ordered.  Risk Decision regarding hospitalization.   This patient presents to the ED for concern of abnormal lab work, this involves an extensive number of treatment options, and is a complaint that carries with it a high risk of complications and morbidity.  The differential diagnosis includes dehydration, rhabdomyolysis, medication side effects, neoplasm, obstructive uropathy   Co morbidities that complicate the patient evaluation  CAD, COPD, HLD, GERD, HTN, lung cancer, atrial fibrillation   Additional history  obtained:  Additional history obtained from N/A External records from outside source obtained and reviewed including EMR   Lab Tests:  I Ordered, and personally interpreted labs.  The pertinent results include: Leukocytosis is present.  Acute on chronic anemia is present.  Acute renal failure is present with elevated BUN.  Hyperkalemia identified on earlier lab work has resolved.  CK is elevated, concerning for statin myopathy.   Imaging Studies ordered:  I ordered imaging studies including CT of abdomen and pelvis I independently visualized and interpreted imaging which showed bilateral renal masses, favored to be metastatic; increasing soft tissue at vaginal apex concerning for neoplasm, progressive retroperitoneal adenopathy; increasing perinephric edema I agree with the radiologist interpretation   Cardiac Monitoring: / EKG:  The patient was maintained on a cardiac monitor.  I personally viewed and interpreted the cardiac monitored which showed an underlying rhythm of: Sinus rhythm   Consultations Obtained:  I requested consultation with the nephrologist, Dr. Thedore Mins,  and discussed lab and imaging findings as well as pertinent plan - they recommend: Medical management and admission to [facility] for now.  Nephrology will follow in consult.   Problem List / ED Course / Critical interventions / Medication management  Patient presents for abnormal lab  findings today.  She had a preoperative visit with her oncologist, Dr. Shirline Frees in preparation of the lung biopsy.  Lab work today showed new findings of acute on chronic anemia, azotemia, acute renal failure, and mild hyperkalemia.  Vital signs on arrival are notable for widened pulse pressure, consistent with anemia.  On exam, patient is currently on 5 L of supplemental oxygen which she states is baseline for her.  She does have mildly increased work of breathing and wheezing on lung auscultation.  She also states that this is baseline  for her and declines any breathing treatment at this time.  Lab work was ordered to confirm abnormal findings earlier today.  Noncontrasted CT scan was ordered to assess for possible obstructive uropathy.  IV fluids were started.  Acute renal failure is confirmed on lab work.  CT imaging shows what appears to be bilateral renal masses.  Patient was admitted to medicine for further management. I ordered medication including IV fluids for hydration Reevaluation of the patient after these medicines showed that the patient improved I have reviewed the patients home medicines and have made adjustments as needed   Social Determinants of Health:  Has access to outpatient care         Final Clinical Impression(s) / ED Diagnoses Final diagnoses:  Acute renal failure, unspecified acute renal failure type (HCC)  Low hemoglobin  Azotemia  Elevated CK    Rx / DC Orders ED Discharge Orders     None         Gloris Manchester, MD 09/12/2022 2355

## 2022-08-26 NOTE — Progress Notes (Signed)
CRITICAL VALUE STICKER  CRITICAL VALUE:AST [id number]  RECEIVER (on-site recipient of call):Kim Katrinka Blazing, RN  DATE & TIME NOTIFIED: 09/12/2022 @ 3:25 pm  MESSENGER (representative from lab):Melissa from lab  MD NOTIFIED: Dr Arbutus Ped  TIME OF NOTIFICATION:3:28 pm  RESPONSE: Md to communicate action if warranted to his nurse

## 2022-08-26 NOTE — Telephone Encounter (Signed)
LVM on pts home, mobile and husband's and son's mobile  for pt to go to the ED now for abnormal creatinine and AST.

## 2022-08-26 NOTE — Assessment & Plan Note (Signed)
Plan was for patient to undergo bronchoscopy but she has progressive increasing oxygen requirement with known history of lung involvement.  Would let pulmonology note the patient is here

## 2022-08-26 NOTE — Assessment & Plan Note (Signed)
Urology is aware will see in AM

## 2022-08-26 NOTE — Assessment & Plan Note (Signed)
Hold Crestor given elevated LFTs 

## 2022-08-26 NOTE — Assessment & Plan Note (Signed)
Chronic stable hold aspirin given progressive anemia

## 2022-08-26 NOTE — H&P (Incomplete)
[name] AVW:[id number] DOB: [birth year] DOA: 08/30/2022     PCP: Sandre Kitty, MD   Outpatient Specialists:   CARDS:   Dr. Nicki Guadalajara, MD    Pulmonary  Dr.Wert  Oncology   Dr. Arbutus Ped GI  Dr.    Graylin Shiver, MD    Patient arrived to ER on 09/15/2022 at 1724 Referred by Attending Gloris Manchester, MD   Patient coming from:    home Lives With family    Chief Complaint:   Chief Complaint  Patient presents with   Fatigue    HPI: [name] is a 77 y.o. female with medical history significant of lung cancer, CAD status post CABG, COPD on 4.5-5 L, HLD, GERD, hypothyroidism, hypertension, history of tobacco abuse    Presented with abnormal labs Patient with known non-small cell lung cancer status postresection 2012 and with recurrence history of COPD chronically on 6 L recent PET scan showed evidence of disease progression either metastatic disease or new primary bronchogenic carcinoma History of CAD status post CABG Patient was referred to pulmonology for bronchoscopy Patient was seen by Dr. Arbutus Ped and had preliminary tests done that showed significant abnormalities elevated LFTs elevation BUN and creatinine she was referred to emergency department for further care and evaluation with plan to try to have her bronchoscopy done during hospitalization    Reports feeling tired, no energy, no bleeding No CP , DOE, gets hypoxic easily Have not had goo po intake lately No change in urine output  Reports she has been having some night sweats and fever has been coughing yellow mucus No wheezing   Denies significant ETOH intake   Does not smoke quit  Was supposed to have some surgical resection done of the vaginal/uterine mass on Friday   Lab Results  Component Value Date   SARSCOV2NAA NEGATIVE 01/28/2022   SARSCOV2NAA NEGATIVE 07/25/2021   SARSCOV2NAA NEGATIVE 09/08/2020      Regarding pertinent Chronic problems:     Hyperlipidemia - on statins  [facility] Lipid Panel     Component Value Date/Time   CHOL 99 (L) 04/19/2022 0944   CHOL 114 07/22/2012 0840   TRIG 100 04/19/2022 0944   TRIG 170 (H) 07/22/2012 0840   HDL 43 04/19/2022 0944   HDL 42 07/22/2012 0840   CHOLHDL 2.3 04/19/2022 0944   CHOLHDL 3.2 06/26/2016 0859   VLDL 25 06/26/2016 0859   LDLCALC 37 04/19/2022 0944   LDLCALC 38 07/22/2012 0840   LABVLDL 19 04/19/2022 0944     HTN on toprol   chronic CHF diastolic - last echo  Recent Results (from the past 82956 hour(s))  ECHOCARDIOGRAM COMPLETE   Collection Time: 01/21/22  3:48 PM  Result Value   Area-P 1/2 3.27   S' Lateral 2.90   Narrative      ECHOCARDIOGRAM REPORT     IMPRESSIONS   1. Difficult study due to poor visualization of LV endocardium.  2. Left ventricular ejection fraction, by estimation, is 55 to 60%. The left ventricle has normal function. Left ventricular endocardial border not optimally defined to evaluate regional wall motion. Left ventricular diastolic parameters are consistent  with Grade I diastolic dysfunction (impaired relaxation). The average left ventricular global longitudinal strain is -18.1 %. The global longitudinal strain is normal.  3. Right ventricular systolic function is mildly reduced. The right ventricular size is normal. Tricuspid regurgitation signal is inadequate for assessing PA pressure.  4. The mitral valve is grossly normal. Trivial mitral  valve regurgitation.  5. The aortic valve was not well visualized. Aortic valve regurgitation is not visualized. No aortic stenosis is present.  6. The inferior vena cava is normal in size with greater than 50% respiratory variability, suggesting right atrial pressure of 3 mmHg.  Comparison(s): Compared to prior TTE in 10/2020, the EF appears slightly improved from 50-55% to 55%. Otherwise, there is no significant change.             CAD  - On Aspirin, statin, betablocker,                  -  followed by cardiology          Hypothyroidism:  Lab Results  Component Value Date   TSH 12.388 (H) 08/27/2022   on synthroid 65.5   obesity-   BMI Readings from Last 1 Encounters:  09/13/2022 35.33 kg/m       COPD -  followed by pulmonology   on baseline oxygen 6 L,      OSA -on nocturnal oxygen,      Chronic anemia - baseline hg Hemoglobin & Hematocrit  Recent Labs    06/17/22 1018 09/05/2022 1356 09/10/2022 1849  HGB 9.7* 7.7* 7.3*     While in ER:     Lab Orders         Renal function panel         CBC with Differential         CK         Urinalysis, Routine w reflex microscopic -Urine, Clean Catch       CXR -Progressive airspace disease and pleural fluid on the left with essentially complete opacification of left thorax. Emphysema.    CTabd/pelvis - Increasing perinephric edema is nonspecific. This may be progressing chronic change or infection.  Progressive metastatic disease with increasing left retroperitoneal adenopathy and enlarging right lower lobe pulmonary nodules.  Increasing size of bilateral renal masses, favored to be metastatic rather than primary renal malignancy.  . Increasing soft tissue associated with the left vaginal apex, likely neoplastic.    CT Chest 1. Multiple new right-sided lung nodules consistent with progressive metastatic disease. 2. Chronic left-sided volume loss and consolidation in the left mid to lower lung field with moderate loculated left pleural effusion, not significantly changed. 3. Slight interval enlargement of azygoesophageal recess lymph node and small prevascular lymph nodes. 4. Enlarging bilateral renal masses. 5. Aortic and coronary artery atherosclerosis. 6. Emphysema. 7. Cholelithiasis.   RUQ [place] no cholecystitis  Following Medications were ordered in ER: Medications  sodium chloride 0.9 % bolus 1,000 mL (1,000 mLs Intravenous New Bag/Given 09/13/2022 1846)    _______________________________________________________ ER Provider Called:   Nephrology   Dr.Sigh They Recommend admit to medicine  rehydrate Will see in AM      ED Triage Vitals  Enc Vitals Group     BP 08/25/2022 1737 (!) 112/54     Pulse Rate 09/18/2022 1737 82     Resp 08/21/2022 1737 16     Temp 08/20/2022 1737 98.4 F (36.9 C)     Temp Source 09/05/2022 1737 Oral     SpO2 08/21/2022 1737 95 %     Weight --      Height --      Head Circumference --      Peak Flow --      Pain Score 09/08/2022 1749 0     Pain Loc --      Pain Edu? --  Excl. in GC? --   TMAX(24)@     _________________________________________ Significant initial  Findings: Abnormal Labs Reviewed  RENAL FUNCTION PANEL - Abnormal; Notable for the following components:      Result Value   Glucose, Bld 106 (*)    BUN 115 (*)    Creatinine, Ser 6.17 (*)    Calcium 8.0 (*)    Phosphorus 7.8 (*)    Albumin 2.0 (*)    GFR, Estimated 7 (*)    All other components within normal limits  CBC WITH DIFFERENTIAL/PLATELET - Abnormal; Notable for the following components:   WBC 18.5 (*)    RBC 3.07 (*)    Hemoglobin 7.3 (*)    HCT 25.0 (*)    MCH 23.8 (*)    MCHC 29.2 (*)    RDW 20.3 (*)    Platelets 435 (*)    Neutro Abs 15.1 (*)    Monocytes Absolute 1.8 (*)    Abs Immature Granulocytes 0.50 (*)    All other components within normal limits  CK - Abnormal; Notable for the following components:   Total CK 1,956 (*)    All other components within normal limits    _________________________ Troponin   Cardiac Panel (last 3 results) Recent Labs    08/30/2022 1849  CKTOTAL 1,956*     ECG: Ordered    BNP (last 3 results) Recent Labs    01/28/22 1544 02/01/22 1239  BNP 32.2 252.8*     COVID-19 Labs  No results for input(s): "DDIMER", "FERRITIN", "LDH", "CRP" in the last 72 hours.  Lab Results  Component Value Date   SARSCOV2NAA NEGATIVE 01/28/2022   SARSCOV2NAA NEGATIVE 07/25/2021   SARSCOV2NAA NEGATIVE 09/08/2020      The recent clinical data is shown below. Vitals:    08/21/2022 1737 08/22/2022 2000  BP: (!) 112/54 (!) 115/56  Pulse: 82 77  Resp: 16 18  Temp: 98.4 F (36.9 C)   TempSrc: Oral   SpO2: 95% 98%    WBC     Component Value Date/Time   WBC 18.5 (H) 09/04/2022 1849   LYMPHSABS 0.9 09/13/2022 1849   LYMPHSABS 2.2 10/25/2020 0935   MONOABS 1.8 (H) 08/19/2022 1849   EOSABS 0.2 09/06/2022 1849   EOSABS 0.1 10/25/2020 0935   BASOSABS 0.1 09/15/2022 1849   BASOSABS 0.1 10/25/2020 0935    Lactic Acid, Venous    Component Value Date/Time   LATICACIDVEN 1.0 08/08/2021 2310     Lactic Acid, Venous    Component Value Date/Time   LATICACIDVEN 1.0 08/08/2021 2310    Procalcitonin   Ordered      UA  ordered   Urine analysis:    Component Value Date/Time   COLORURINE YELLOW 08/09/2021 0007   APPEARANCEUR HAZY (A) 08/09/2021 0007   LABSPEC 1.006 08/09/2021 0007   PHURINE 7.0 08/09/2021 0007   GLUCOSEU NEGATIVE 08/09/2021 0007   HGBUR NEGATIVE 08/09/2021 0007   BILIRUBINUR NEGATIVE 08/09/2021 0007   BILIRUBINUR Negative 07/24/2020 1623   KETONESUR NEGATIVE 08/09/2021 0007   PROTEINUR 30 (A) 08/09/2021 0007   UROBILINOGEN 0.2 07/24/2020 1623   UROBILINOGEN 0.2 09/13/2010 0906   NITRITE NEGATIVE 08/09/2021 0007   LEUKOCYTESUR MODERATE (A) 08/09/2021 0007      ___  __________________________________________________________ Recent Labs  Lab 09/06/2022 1356  1849  NA 140 136  K 5.2* 4.7  CO2 25 22  GLUCOSE 79 106*  BUN 111* 115*  CREATININE 6.66* 6.17*  CALCIUM 8.8* 8.0*  PHOS  --  7.8*    Cr  Up from baseline see below Lab Results  Component Value Date   CREATININE 6.17 (H) 09/11/2022   CREATININE 6.66 (H) 08/31/2022   CREATININE 0.91 06/17/2022    Recent Labs  Lab 09/08/2022 1356 08/21/2022 1849  AST 208*  --   ALT 205*  --   ALKPHOS 147*  --   BILITOT 0.3  --   PROT 6.8  --   ALBUMIN 2.6* 2.0*   Lab Results  Component Value Date   CALCIUM 8.0 (L) 09/13/2022   PHOS 7.8 (H) 08/23/2022    Plt: Lab  Results  Component Value Date   PLT 435 (H) 08/25/2022       Recent Labs  Lab 09/15/2022 1356 09/09/2022 1849  WBC 16.9* 18.5*  NEUTROABS 13.6* 15.1*  HGB 7.7* 7.3*  HCT 26.0* 25.0*  MCV 81.8 81.4  PLT 434* 435*    HG/HCT    Down  from baseline see below    Component Value Date/Time   HGB 7.3 (L) 09/14/2022 1849   HGB 7.7 (L) 08/31/2022 1356   HGB 9.5 (L) 04/19/2022 0944   HCT 25.0 (L) 08/23/2022 1849   HCT 31.2 (L) 04/19/2022 0944   MCV 81.4 08/25/2022 1849   MCV 89 04/19/2022 0944    _______________________________________________ Hospitalist was called for admission for AKI,    The following Work up has been ordered so far:  Orders Placed This Encounter  Procedures   CT ABDOMEN PELVIS WO CONTRAST   Renal function panel   CBC with Differential   CK   Urinalysis, Routine w reflex microscopic -Urine, Clean Catch   Consult to hospitalist     OTHER Significant initial  Findings:  labs showing:     DM  labs:  HbA1C: Recent Labs    02/02/22 0249 04/19/22 0944  HGBA1C 6.4* 5.8*    CBG (last 3)  No results for input(s): "GLUCAP" in the last 72 hours.        Cultures:    Component Value Date/Time   SDES BLOOD BLOOD RIGHT WRIST 08/08/2021 2332   SPECREQUEST  08/08/2021 2332    BOTTLES DRAWN AEROBIC AND ANAEROBIC Blood Culture adequate volume   CULT  08/08/2021 2332    NO GROWTH 5 DAYS Performed at [facility], [address] [place], [place] [address]    REPTSTATUS 08/13/2021 FINAL 08/08/2021 2332     Radiological Exams on Admission: US Abdomen Limited RUQ (LIVER/GB)  Result Date: 09/15/2022 CLINICAL DATA:  Elevated liver function test. EXAM: ULTRASOUND ABDOMEN LIMITED RIGHT UPPER QUADRANT COMPARISON:  None Available. FINDINGS: Gallbladder: Numerous shadowing echogenic gallstones are seen within the gallbladder lumen. The largest measures approximately 1.2 cm. The gallbladder wall measures 2.3 mm in thickness. No sonographic Murphy sign noted by  sonographer. Common bile duct: Diameter: 5.8 mm Liver: 1.4 cm x 1.3 cm x 1.5 cm thinly septated cyst is seen within the left lobe of the liver. A similar appearing lesion is seen within this region on the prior study. Within normal limits in parenchymal echogenicity. Portal vein is patent on color Doppler imaging with normal direction of blood flow towards the liver. Other: None. IMPRESSION: 1. Cholelithiasis. 2. Predominantly stable, complex hepatic cyst within the left lobe. Electronically Signed   By: Aram Candela M.D.   On: 08/26/2022 00:19   CT CHEST WO CONTRAST  Result Date: 08/21/2022 CLINICAL DATA:  Known or suspected pleural effusion. Non-small cell lung cancer patient undergoing treatment. EXAM: CT CHEST  WITHOUT CONTRAST TECHNIQUE: Multidetector CT imaging of the chest was performed following the standard protocol without IV contrast. RADIATION DOSE REDUCTION: This exam was performed according to the departmental dose-optimization program which includes automated exposure control, adjustment of the mA and/or kV according to patient size and/or use of iterative reconstruction technique. COMPARISON:  PET-CT 07/25/2022, chest CT with contrast 06/17/2022. FINDINGS: Cardiovascular: There is a right chest MediPort with access needle in place, IJ approach catheter again terminates just above the superior cavoatrial junction. There is mild cardiomegaly with CABG changes chronically seen, heavy native three-vessel coronary plaques and atherosclerosis in the aorta and great vessels without aneurysm. Pulmonary arteries and veins are normal caliber. Mediastinum/Nodes: Chronic left-sided volume loss and mediastinal shift. There is a mildly enlarged azygoesophageal recess lymph node measuring 1.6 x 1.2 cm, previously 1.6 x 0.9 cm. Shotty subcentimeter in short axis prevascular nodes are very slightly more prominent than previously, for example a lymph node anterior to the brachiocephalic arch to the right is 7.3  mm short axis, previously 5 mm. There is a lymph node in between the SVC and innominate artery origin which is 0.8 cm in short axis today and was previously 5 mm short axis. There is no appreciable bulky or encasing adenopathy although assessment of the hilar structures is limited without contrast. There is no axillary or supraclavicular adenopathy. Unremarkable thoracic esophagus. Unremarkable thyroid gland. Lungs/Pleura: There is centrilobular emphysema. As above there is left-sided volume loss possibly postsurgical. Extensive consolidation continues to be seen in left mid to lower lung field and moderate loculated pleural effusion was apical, anterior and basilar components is not significantly changed. Scar-like opacities are again shown in the left upper lung field. There is a 1.6 cm right upper lobe solid nodule which was previously 1 cm now demonstrating partial cavitation. There are additional scattered new nodules, for example in the anterior right upper lobe base there are 3 new nodules measuring 0.9 cm on 4:58, 5 mm on 4:66, and 4 mm on 4:52. There is a new right middle lobe nodule measuring 6 mm on 4:74 with new right lower lobe nodules largest is 1.5 cm on 4:91, another in the anterior basal segment measures 1 cm on 4:88 and there are small subcentimeter right lower lobe nodules on 4:80 on 4:104. Findings are consistent with progressive metastatic disease. Background subpleural chronic reticulation is noted. No right pleural effusion. Upper Abdomen: Heterogeneous renal masses are larger than last month's PET-CT. Anterior interpolar right renal mass measures 4.2 cm on 2:61, previously 3.1 cm. Posterior left mid pole mass is 5.5 cm on 3:63, incompletely seen but previously no more than 3.1 cm. No liver mass is seen without contrast. There is cholelithiasis without wall thickening. Musculoskeletal: No destructive bone lesions. Osteopenia and degenerative change of the spine. Chronic healed median  sternotomy. No appreciable chest wall mass. IMPRESSION: 1. Multiple new right-sided lung nodules consistent with progressive metastatic disease. 2. Chronic left-sided volume loss and consolidation in the left mid to lower lung field with moderate loculated left pleural effusion, not significantly changed. 3. Slight interval enlargement of azygoesophageal recess lymph node and small prevascular lymph nodes. 4. Enlarging bilateral renal masses. 5. Aortic and coronary artery atherosclerosis. 6. Emphysema. 7. Cholelithiasis. Aortic Atherosclerosis (ICD10-I70.0) and Emphysema (ICD10-J43.9). Electronically Signed   By: Almira Bar M.D.   On: 09/11/2022 23:59   DG Chest 2 View  Result Date: 09/08/2022 CLINICAL DATA:  Hypoxia EXAM: CHEST - 2 VIEW COMPARISON:  02/20/2022, PET CT 02/23/2022 FINDINGS: Right-sided  central venous port tip over the cavoatrial region. Post sternotomy changes. Progressive airspace disease and pleural fluid on the left with essentially complete opacification of left thorax. Emphysema. No acute airspace disease on the right. IMPRESSION: Progressive airspace disease and pleural fluid on the left with essentially complete opacification of left thorax. Emphysema. Electronically Signed   By: Jasmine Pang M.D.   On: 09/04/2022 22:14   CT ABDOMEN PELVIS WO CONTRAST  Result Date:  CLINICAL DATA:  Diffuse abdominal pain. Patient with history of lung cancer. EXAM: CT ABDOMEN AND PELVIS WITHOUT CONTRAST TECHNIQUE: Multidetector CT imaging of the abdomen and pelvis was performed following the standard protocol without IV contrast. RADIATION DOSE REDUCTION: This exam was performed according to the departmental dose-optimization program which includes automated exposure control, adjustment of the mA and/or kV according to patient size and/or use of iterative reconstruction technique. COMPARISON:  PET CT 07/25/2022, chest CT 06/17/2022 abdominal CT 04/25/2022 FINDINGS: Lower chest: Chronic left  pleural effusion and basilar opacification. Multiple pulmonary nodules in the right lung base. Largest measures 14 mm series 5, image 20. 9 mm right lower lobe nodule, series 5, image 12. 6 mm pleural based right lower lobe nodule series 5, image 32. These nodules appear increased from prior exams. Baseline fibrotic changes. Hepatobiliary: The low-density liver lesions on prior contrast-enhanced exam are not well-defined in the absence of IV contrast. Unremarkable gallbladder. No biliary dilatation. Pancreas: No ductal dilatation or inflammation. Spleen: Normal in size without focal abnormality. Adrenals/Urinary Tract: No adrenal nodule. Heterogeneous hypodense mass in the medial left kidney measures approximately 5.1 cm, FDG avid on prior PET. There is a 4.2 cm right renal mass in the anterior mid kidney that is increased in size from prior abdominal CT, series 2, image 31. Bilateral perinephric fat stranding. No hydronephrosis. Calcifications at the renal hila felt to be vascular. Unremarkable urinary bladder. Stomach/Bowel: Detailed bowel assessment is limited in the absence of contrast. The stomach is partially distended, normal for degree of distension. There is no bowel obstruction or inflammation. Normal appendix visualized small to moderate colonic stool burden. Mild sigmoid diverticulosis without diverticulitis. Vascular/Lymphatic: Aortic atherosclerosis. Increasing left retroperitoneal nodal tissue it is now contiguous with the left psoas muscle. This is poorly defined in the absence of IV contrast, however measures approximately 3.5 x 2.6 cm series 2, image 39. Additional left retroperitoneal nodes at the level of the left kidney measuring 11 and 12 mm. Reproductive: Soft tissue thickening in the left vaginal apex series 2, image 79 corresponding to FDG avid lesion on prior PET. This is increased, approximately 3.4 cm series 2, image 79, previously 2.5 cm. Other: Increasing bilateral perinephric  stranding. No ascites. No abdominal wall hernia. Musculoskeletal: No suspicious bone lesion. No acute osseous findings. IMPRESSION: 1. Increasing perinephric edema is nonspecific. This may be progressing chronic change or infection. 2. Progressive metastatic disease with increasing left retroperitoneal adenopathy and enlarging right lower lobe pulmonary nodules. 3. Increasing size of bilateral renal masses, favored to be metastatic rather than primary renal malignancy. 4. Increasing soft tissue associated with the left vaginal apex, likely neoplastic. Aortic Atherosclerosis (ICD10-I70.0). Electronically Signed   By: Narda Rutherford M.D.   On: 09/08/2022 19:48   _______________________________________________________________________________________________________ Latest  Blood pressure (!) 115/56, pulse 77, temperature 98.4 F (36.9 C), temperature source Oral, resp. rate 18, SpO2 98 %.   Vitals  labs and radiology finding personally reviewed  Review of Systems:    Pertinent positives include:   fatigue,  Constitutional:  No  weight loss, night sweats, Fevers, chills, weight loss  HEENT:  No headaches, Difficulty swallowing,Tooth/dental problems,Sore throat,  No sneezing, itching, ear ache, nasal congestion, post nasal drip,  Cardio-vascular:  No chest pain, Orthopnea, PND, anasarca, dizziness, palpitations.no Bilateral lower extremity swelling  GI:  No heartburn, indigestion, abdominal pain, nausea, vomiting, diarrhea, change in bowel habits, loss of appetite, melena, blood in stool, hematemesis Resp:  no shortness of breath at rest. No dyspnea on exertion, No excess mucus, no productive cough, No non-productive cough, No coughing up of blood.No change in color of mucus.No wheezing. Skin:  no rash or lesions. No jaundice GU:  no dysuria, change in color of urine, no urgency or frequency. No straining to urinate.  No flank pain.  Musculoskeletal:  No joint pain or no joint swelling. No  decreased range of motion. No back pain.  Psych:  No change in mood or affect. No depression or anxiety. No memory loss.  Neuro: no localizing neurological complaints, no tingling, no weakness, no double vision, no gait abnormality, no slurred speech, no confusion  All systems reviewed and apart from HOPI all are negative _______________________________________________________________________________________________ Past Medical History:   Past Medical History:  Diagnosis Date   CAD (coronary artery disease)    Cancer (HCC)    COPD (chronic obstructive pulmonary disease) (HCC)    per DM note, pt denies   Dyslipidemia    GERD (gastroesophageal reflux disease)    History of radiation therapy    Left lung- 01/16/21-01/30/21- Dr. Antony Blackbird   History of radiation therapy    Left lung- 08/02/21-09/18/21- Dr. Antony Blackbird   HTN (hypertension)    Hypothyroidism    [facility]    Non-small cell lung cancer (HCC) 08/19/2010   Stage IA, status post left upper lobectomy July 2012   Tobacco abuse     Past Surgical History:  Procedure Laterality Date   CARDIAC CATHETERIZATION  07/06/2010   see CABG report - pt sent to OR   CARDIOVASCULAR STRESS TEST  09/11/2010   R/S MV - normal perfusion in all regions, EF 46%, no scintigraphic evidence of inducible myocardial ischemia; global LV systolic function mildly reduced; no significant wall motion abnormalities noted; Exercise capacity 7 METS; EKG negative for ischemia; low risk study, no signifcant change from previous study 08/2003   CORONARY ARTERY BYPASS GRAFT  07/11/2010   LIMA to LAD; SVG to 2nd branch OM; SVG to posterior descending artery   IR IMAGING GUIDED PORT INSERTION  08/16/2021   LEFT VATS  09/17/2010   TEE WITHOUT CARDIOVERSION  07/06/2010   during emergent CABG surgery; 2-3+ mitral regurgitation   VIDEO BRONCHOSCOPY N/A 07/27/2021   Procedure: VIDEO BRONCHOSCOPY;  Surgeon: Loreli Slot, MD;  Location: [facility] OR;   Service: Thoracic;  Laterality: N/A;   VIDEO BRONCHOSCOPY WITH ENDOBRONCHIAL ULTRASOUND N/A 07/27/2021   Procedure: VIDEO BRONCHOSCOPY WITH ENDOBRONCHIAL ULTRASOUND;  Surgeon: Loreli Slot, MD;  Location: [facility] OR;  Service: Thoracic;  Laterality: N/A;    Social History:       reports that she quit smoking about 13 months ago. Her smoking use included cigarettes. She has a 58.00 pack-year smoking history. She has quit using smokeless tobacco. She reports that she does not drink alcohol and does not use drugs.   Family History:  Family History  Problem Relation Age of Onset   Hypothyroidism Father    Heart attack Father    Aplastic anemia Maternal Grandfather    Stroke Paternal Grandfather  ______________________________________________________________________________________________ Allergies: Allergies  Allergen Reactions   Amiodarone Shortness Of Breath    Contraindicated for patients on chemotherapy or immunotherapy    Codeine Other (See Comments)    Unknown reaction Patient avoids this medication     Prior to Admission medications   Medication Sig Start Date End Date Taking? Authorizing Provider  acetaminophen (TYLENOL) 500 MG tablet Take 500 mg by mouth every 6 (six) hours as needed for headache or fever.    [provider]  arformoterol (BROVANA) 15 MCG/2ML NEBU Take 2 mLs (15 mcg total) by nebulization 2 (two) times daily. Patient taking differently: Take 15 mcg by nebulization daily. 03/13/22   Nyoka Cowden, MD  aspirin 81 MG tablet Take 81 mg by mouth at bedtime.    [provider]  budesonide (PULMICORT) 0.5 MG/2ML nebulizer solution Take 2 mLs (0.5 mg total) by nebulization 2 (two) times daily. 05/24/22   Nyoka Cowden, MD  cephALEXin (KEFLEX) 500 MG capsule Take 500 mg by mouth 2 (two) times daily. 08/24/22   [provider]  Coenzyme Q10 (CO Q 10 PO) Take 1 capsule by mouth daily.    [provider]  furosemide (LASIX) 20  MG tablet Take 1 tablet (20 mg total) by mouth daily. 07/29/22   [name], NP  levothyroxine (SYNTHROID) 25 MCG tablet Take 0.5 tablets (12.5 mcg total) by mouth daily. In addition to the 50 mcg. 07/29/22   [name], NP  levothyroxine (SYNTHROID) 50 MCG tablet TAKE 1 TABLET BY MOUTH DAILY ALONG WITH 12.5 MCG EXCEPT ON SUNDAY TAKE 50 MCG Patient taking differently: Take 50 mcg by mouth daily before breakfast. 05/01/22   [name], NP  lidocaine-prilocaine (EMLA) cream Apply 1 Application topically as needed. Patient taking differently: Apply 1 Application topically as needed (pain). 08/06/21   Heilingoetter, Cassandra L, PA-C  metoprolol succinate (TOPROL-XL) 100 MG 24 hr tablet TAKE 1 TABLET BY MOUTH DAILY. TAKE WITH OR IMMEDIATELY FOLLOWING A MEAL. Patient taking differently: Take 50 mg by mouth daily. TAKE WITH OR IMMEDIATELY FOLLOWING A MEAL. 08/09/22   [name], NP  naproxen sodium (ALEVE) 220 MG tablet Take 220 mg by mouth 2 (two) times daily as needed (pain).    [provider]  Omega-3 Fatty Acids (OMEGA-3 PO) Take 1,000 mg by mouth in the morning and at bedtime.    [provider]  potassium chloride (KLOR-CON M10) 10 MEQ tablet Take 1 tablet (10 mEq total) by mouth daily. Patient taking differently: Take 10 mEq by mouth every Monday, Wednesday, and Friday. 07/29/22   [name], NP  revefenacin (YUPELRI) 175 MCG/3ML nebulizer solution INHALE VIA NEBULIZER DAILY 08/07/22   Nyoka Cowden, MD  rosuvastatin (CRESTOR) 40 MG tablet Take 1 tablet (40 mg total) by mouth daily. 09/27/21   [name], NP    ___________________________________________________________________________________________________ Physical Exam:        8:00 PM 09/08/2022    5:37 PM 08/24/2022    2:22 PM  Vitals with BMI  Height   5\' 4"   Systolic 115 112 161  Diastolic 56 54 60  Pulse 77 82 75     1. General:  in No  Acute distress     Chronically ill  -appearing 2. Psychological: Alert and   Oriented 3. Head/ENT:   Dry Mucous Membranes  Head Non traumatic, neck supple                            Poor Dentition 4. SKIN:  decreased Skin turgor,  Skin clean Dry and intact no rash    5. Heart: Regular rate and rhythm no  Murmur, no Rub or gallop 6. Lungs: , no wheezes or crackles  decreased BS on the left base 7. Abdomen: Soft,  non-tender, Non distended  bowel sounds present 8. Lower extremities: no clubbing, cyanosis, no  edema 9. Neurologically Grossly intact, moving all 4 extremities equally  10. MSK: Normal range of motion    Chart has been reviewed  ______________________________________________________________________________________________  Assessment/Plan  77 y.o. female with medical history significant of lung cancer, CAD status post CABG, COPD on 6 L, HLD, GERD, hypothyroidism, hypertension, history of tobacco abuse    Admitted for AKI, anemia    Present on Admission:  AKI (acute kidney injury) (HCC)  Paroxysmal atrial fibrillation with RVR (HCC)  HTN (hypertension)  CAD (coronary artery disease)  Anemia  Transaminitis  Hypothyroidism  Acute on chronic respiratory failure with hypoxia and hypercapnia (HCC)  Mixed hyperlipidemia  Pneumonia  Pleural effusion  Mass of left kidney  Hyperphosphatemia  Hypoalbuminemia     Paroxysmal atrial fibrillation with RVR (HCC) Resume metoprolol if able to tolerate not on anticoagulation continue to monitor hold aspirin given progressive anemia  Chronic congestive heart failure (HCC) Rehydrate given AKI and continue to follow CK avoid over aggressive fluid resuscitation to avoid fluid overload  HTN (hypertension) Allow permissive hypertension  AKI (acute kidney injury) (HCC) Obtain urine electrolytes gently rehydrate Noted elevated CK may respond to IV fluids.  Discussed case with urology given bilateral renal masses Dr  Annabell Howells:  "I think she does have some hydro on the left probably from the mass. It is particularly apparent in the upper pole. The lower pole is more compressed. Not sure why radiology didn't mention it. The right is draining well. with unilateral obstruction, I wouldn't expect that big a jump in Cr. On the PET scan from June 6th there was tracer in the right ureter all the way to the bladder but on the left there is not and the dilated collecting system on the left had no tracer uptake so this is not acute obstruction. With the elevated CK, it would be reasonable to treat with hydration initially but if she doesn't respond the placement of a left ureteral stent could be considered. Would you mind putting in a formal consult request with our hospital NP, Elmon Kirschner, in the morning."  Keep n.p.o. post breakfast in case may need to stenting  CAD (coronary artery disease) Chronic stable hold aspirin given progressive anemia  Anemia Obtain anemia panel Hemoccult stool transfuse if patient is symptomatic or hemoglobin below 7 I expect hemoglobin to continue to drift down after IV fluid administration  Transaminitis Noted elevated LFTs elevated CK Question if dehydration related CT scan showed no evidence of hepatic involvement If does not improve may need father workup and GI consult Order hepatitis serologies for completion Given RUQ tenderness  Ordered RUQ [place]    Hypothyroidism Elevated TSH May need to bump up the dose if patient has been compliant current dose of Synthroid 65.5 mcg  Acute on chronic respiratory failure with hypoxia and hypercapnia (HCC) Plan was for patient to undergo bronchoscopy but she has progressive increasing oxygen requirement with known history of lung involvement.  Would let pulmonology note the patient is here   Mixed hyperlipidemia Hold Crestor given elevated LFTs  Pneumonia Suspect postobstructive pNAstart on zosyn  Would benefirt from pulmonology  consult in AM  Pleural effusion May benefit from thoracentesis  Pulmonology consult in AM Albumin administration   Mass of left kidney Urology is aware will see in AM   Hyperphosphatemia In the setting of AKI Patient technically does not meet criteria for tumor lysis syndrome. Ashby Dawes of her malignancy make it somewhat less likely 2.  Appreciate oncology see her in the morning.  Appreciate nephrology consult most likely because of hyperphosphatemia is renal failure   Hypoalbuminemia Will administer albumin to help with third spacing and AKI   Other plan as per orders.  DVT prophylaxis:  SCD    Code Status:  DNR/DNI   as per patient   I had personally discussed CODE STATUS with patien  ACP none   Family Communication:   Family not at  Bedside    Diet heart healthy and NPO post 5 am    Disposition Plan:       To home once workup is complete and patient is stable   Following barriers for discharge:                            Electrolytes corrected                               Anemia corrected                                                       Will need consultants to evaluate patient prior to discharge       Consult Orders  (From admission, onward)           Start     Ordered   09/02/2022 2134  Nutritional services consult  Once       Provider:  (Not yet assigned)  Question:  Reason for Consult?  Answer:  assess nutrtional status   08/30/2022 2134   08/27/2022 2136  PT eval and treat  Routine       Question:  Reason for PT?  Answer:  debility   09/06/2022 2135   09/01/2022 2136  OT eval and treat  Routine       Question:  Reason for OT?  Answer:  debility   09/17/2022 2135   08/25/2022 2023  Consult to hospitalist  Once       Provider:  (Not yet assigned)  Question Answer Comment  Place call to: [facility]   Reason for Consult Admit      09/11/2022 2022                               Would benefit from PT/OT eval prior to DC  Ordered                                         Consults called:    discussed with Urology Dr. Annabell Howells ER provider discussed with Nephrology Dr. Ellouise Newer will see in  AM  Oncology is aware discussed with Dr. Leonides Schanz   sent masg to Pulmonology to see in AM   Admission status:  ED Disposition     ED Disposition  Admit   Condition  --   Comment  Hospital Area: [facility] [[id number]]  Level of Care: Progressive [102]  Admit to Progressive based on following criteria: NEPHROLOGY stable condition requiring close monitoring for AKI, requiring Hemodialysis or Peritoneal Dialysis either from expected electrolyte imbalance, acidosis, or fluid overload that can be managed by NIPPV or high flow oxygen.  May admit patient to [facility] or [facility] if equivalent level of care is available:: No  Covid Evaluation: Asymptomatic - no recent exposure (last 10 days) testing not required  Diagnosis: AKI (acute kidney injury) [facility]) [[id number]]  Admitting Physician: Therisa Doyne [[id number]]  Attending Physician: Therisa Doyne [[id number]]  Certification:: I certify this patient will need inpatient services for at least 2 midnights  Estimated Length of Stay: 2           inpatient     I Expect 2 midnight stay secondary to severity of patient's current illness need for inpatient interventions justified by the following:  hemodynamic instability despite optimal treatment ( hypotension, hypoxia )  Severe lab/radiological/exam abnormalities including:    aki and extensive comorbidities including:   CAD   COPD/asthma    malignancy,    That are currently affecting medical management.   I expect  patient to be hospitalized for 2 midnights requiring inpatient medical care.  Patient is at high risk for adverse outcome (such as loss of life or disability) if not treated.  Indication for inpatient stay as follows:    Hemodynamic instability despite maximal medical therapy,    inability to maintain oral hydration     Need for operative/procedural  intervention New or worsening hypoxia   Need for IV antibiotics, IV fluids,      Level of care        progressive tele indefinitely please discontinue once patient no longer qualifies COVID-19 Labs    Dondi Aime 08/26/2022, 1:02 AM    [facility]     after 2 AM please page floor coverage PA If 7AM-7PM, please contact the day team taking care of the patient using [email or web address]

## 2022-08-26 NOTE — Progress Notes (Addendum)
Anesthesia Chart Review: Same day workup  Follows with oncology for hx of stage Ia non-small cell lung cancer, adenocarcinoma status post surgical resection in July 2012 as well as recurrent stage Ia non-small cell lung cancer, squamous cell carcinoma status post SBRT completed January 30, 2021. Also completed a course of concurrent chemoradiation. She has hx of COPD with chronic respiratory failure and is chronically on 4 liters of supplemental O2. Former smoker, quit 07/2021. Surveillance CT scan of the chest on 06/17/2022 showed increasing right hilar lymph node. PET scan  07/25/2022 showed evidence for interval disease progression with new tracer avid nodule within the right upper lobe worrisome for either metastatic disease or new primary bronchogenic carcinoma.  There was also tracer avid bilateral kidney lesions increasing in size and concerning for either renal metastasis versus bilateral primary renal neoplasm but giving the rapid interval increase in size this favored to represent metastatic disease.  There was also multifocal tracer avid left retroperitoneal lymph nodes compatible with nodal metastasis.  The patient also incidentally had tracer avid soft tissue nodule within the left side of the pelvis along the left vaginal cuff worrisome for malignancy and diffuse increased FDG uptake associated with the consolidated left lung that is nonspecific and may reflect underlying inflammatory/infectious process but underlying tumor cannot be excluded.  She was referred to pulmonology for bronchoscopy.  Follows with cardiology for hx of CAD status post CABG in 2012, ischemic cardiomyopathy (EF 35% in 2012, improved to 50 to 55% 10/2020), atrial fibrillation (not on AC due to bleeding risk). Cardiac clearance per telephone encounter 08/09/22 by Edd Fabian, NP, "Chart reviewed as part of pre-operative protocol coverage. Given past medical history and time since last visit, based on ACC/AHA guidelines, MACKYNZIE LEWERENZ would be at acceptable risk for the planned procedure without further cardiovascular testing."  Patient seen in follow-up by oncologist Dr. Arbutus Ped on 09/02/2022.  Repeat labs at that time were significantly abnormal.  Per note, "She is scheduled for bronchoscopy tomorrow to confirm her diagnosis.  I noticed that she also has elevation of the liver enzyme on the blood work performed today concerning for possible liver involvement.  Comprehensive metabolic panel also showed significant increase in her BUN and creatinine.  I would recommend for the patient to go to the emergency department for evaluation of her acute renal failure and also for the persistent anemia. She may be able to get her bronchoscopy done during her hospitalization."  Ability to proceed with surgery pending ED evaluation.  EKG 08/07/22: Unusual P axis, possible ectopic atrial rhythm. Rate 86. Low voltage QRS. Nonspecific ST abnormality  TTE 01/21/22:  1. Difficult study due to poor visualization of LV endocardium.   2. Left ventricular ejection fraction, by estimation, is 55 to 60%. The  left ventricle has normal function. Left ventricular endocardial border  not optimally defined to evaluate regional wall motion. Left ventricular  diastolic parameters are consistent  with Grade I diastolic dysfunction (impaired relaxation). The average left  ventricular global longitudinal strain is -18.1 %. The global longitudinal  strain is normal.   3. Right ventricular systolic function is mildly reduced. The right  ventricular size is normal. Tricuspid regurgitation signal is inadequate  for assessing PA pressure.   4. The mitral valve is grossly normal. Trivial mitral valve  regurgitation.   5. The aortic valve was not well visualized. Aortic valve regurgitation  is not visualized. No aortic stenosis is present.   6. The inferior vena cava is normal  in size with greater than 50%  respiratory variability, suggesting right  atrial pressure of 3 mmHg.   Comparison(s): Compared to prior TTE in 10/2020, the EF appears slightly  improved from 50-55% to 55%. Otherwise, there is no significant change.   Nuclear stress 12/28/20:   Findings are consistent with no prior ischemia. The study is low risk.   No ST deviation was noted.   Left ventricular function is abnormal. Global function is mildly reduced. End diastolic cavity size is mildly enlarged. End systolic cavity size is mildly enlarged.   Prior study available for comparison from 11/05/2016.   Abnormal, low risk stress nuclear study with septal and apical thinning versus small prior infarct.  No ischemia.  Gated ejection fraction 46% with global hypokinesis and mild left ventricular enlargement.    Zannie Cove [facility] Stay [facility]/Anesthesiology Phone [phone]  4:10 PM

## 2022-08-26 NOTE — Progress Notes (Signed)
[facility] [phone]   [phone]  OFFICE PROGRESS NOTE  Sandre Kitty, MD [address] [place] [address]  DIAGNOSIS: Likely metastatic non-small cell lung cancer initially diagnosed as stage IIIA (T3, N2, M0) non-small cell lung cancer, squamous cell carcinoma presented with left hilar mass with suspicious mediastinal lymphadenopathy on the last CT scan of the chest at [facility] health.  PRIOR THERAPY:  1) A course of concurrent chemoradiation with weekly carboplatin for AUC of 2 and paclitaxel 45 Mg/M2.  Status post 6 cycles. 2) Consolidation treatment with immunotherapy with Imfinzi 1500 Mg IV every 4 weeks.  First dose October 18, 2021.  Status post 4 cycles.  Last dose was given on January 14, 2022 discontinued secondary to suspicious immunotherapy mediated pneumonitis  CURRENT THERAPY: Observation.  INTERVAL HISTORY: [name] 77 y.o. female returns to the clinic today for follow-up visit accompanied by her husband.  The patient continues to complain of the baseline shortness of breath and she is currently on home oxygen up to 6 L/min nasal cannula.  She spent a week in [place] and her oxygen saturation was down there.  She has an appointment for bronchoscopy tomorrow but she was little bit hesitant about the procedure and concern about not coming out of the vent after intubation.  She denied having any current chest pain or hemoptysis.  She has no nausea, vomiting, diarrhea or constipation.  She has no headache or visual changes.  MEDICAL HISTORY: Past Medical History:  Diagnosis Date   CAD (coronary artery disease)    Cancer (HCC)    COPD (chronic obstructive pulmonary disease) (HCC)    per DM note, pt denies   Dyslipidemia    GERD (gastroesophageal reflux disease)    History of radiation therapy    Left lung- 01/16/21-01/30/21- Dr. Antony Blackbird   History of radiation therapy    Left lung- 08/02/21-09/18/21- Dr.  Antony Blackbird   HTN (hypertension)    Hypothyroidism    Myocardial infarct [facility])    Non-small cell lung cancer (HCC) 08/19/2010   Stage IA, status post left upper lobectomy July 2012   Tobacco abuse     ALLERGIES:  is allergic to amiodarone and codeine.  MEDICATIONS:  Current Outpatient Medications  Medication Sig Dispense Refill   acetaminophen (TYLENOL) 500 MG tablet Take 500 mg by mouth every 6 (six) hours as needed for headache or fever.     arformoterol (BROVANA) 15 MCG/2ML NEBU Take 2 mLs (15 mcg total) by nebulization 2 (two) times daily. (Patient taking differently: Take 15 mcg by nebulization daily.) 120 mL 11   aspirin 81 MG tablet Take 81 mg by mouth at bedtime.     budesonide (PULMICORT) 0.5 MG/2ML nebulizer solution Take 2 mLs (0.5 mg total) by nebulization 2 (two) times daily. 120 mL 11   Coenzyme Q10 (CO Q 10 PO) Take 1 capsule by mouth daily.     furosemide (LASIX) 20 MG tablet Take 1 tablet (20 mg total) by mouth daily. 90 tablet 1   levothyroxine (SYNTHROID) 25 MCG tablet Take 0.5 tablets (12.5 mcg total) by mouth daily. In addition to the 50 mcg. 45 tablet 1   levothyroxine (SYNTHROID) 50 MCG tablet TAKE 1 TABLET BY MOUTH DAILY ALONG WITH 12.5 MCG EXCEPT ON SUNDAY TAKE 50 MCG (Patient taking differently: Take 50 mcg by mouth daily before breakfast.) 90 tablet 1   lidocaine-prilocaine (EMLA) cream Apply 1 Application topically as needed. (  Patient taking differently: Apply 1 Application topically as needed (pain).) 30 g 2   metoprolol succinate (TOPROL-XL) 100 MG 24 hr tablet TAKE 1 TABLET BY MOUTH DAILY. TAKE WITH OR IMMEDIATELY FOLLOWING A MEAL. (Patient taking differently: Take 50 mg by mouth daily. TAKE WITH OR IMMEDIATELY FOLLOWING A MEAL.) 90 tablet 1   naproxen sodium (ALEVE) 220 MG tablet Take 220 mg by mouth 2 (two) times daily as needed (pain).     Omega-3 Fatty Acids (OMEGA-3 PO) Take 1,000 mg by mouth in the morning and at bedtime.     potassium chloride  (KLOR-CON M10) 10 MEQ tablet Take 1 tablet (10 mEq total) by mouth daily. (Patient taking differently: Take 10 mEq by mouth every Monday, Wednesday, and Friday.) 90 tablet 1   revefenacin (YUPELRI) 175 MCG/3ML nebulizer solution INHALE VIA NEBULIZER DAILY 90 mL 11   rosuvastatin (CRESTOR) 40 MG tablet Take 1 tablet (40 mg total) by mouth daily. 90 tablet 3   No current facility-administered medications for this visit.    SURGICAL HISTORY:  Past Surgical History:  Procedure Laterality Date   CARDIAC CATHETERIZATION  07/06/2010   see CABG report - pt sent to OR   CARDIOVASCULAR STRESS TEST  09/11/2010   R/S MV - normal perfusion in all regions, EF 46%, no scintigraphic evidence of inducible myocardial ischemia; global LV systolic function mildly reduced; no significant wall motion abnormalities noted; Exercise capacity 7 METS; EKG negative for ischemia; low risk study, no signifcant change from previous study 08/2003   CORONARY ARTERY BYPASS GRAFT  07/11/2010   LIMA to LAD; SVG to 2nd branch OM; SVG to posterior descending artery   IR IMAGING GUIDED PORT INSERTION  08/16/2021   LEFT VATS  09/17/2010   TEE WITHOUT CARDIOVERSION  07/06/2010   during emergent CABG surgery; 2-3+ mitral regurgitation   VIDEO BRONCHOSCOPY N/A 07/27/2021   Procedure: VIDEO BRONCHOSCOPY;  Surgeon: Loreli Slot, MD;  Location: [facility] OR;  Service: Thoracic;  Laterality: N/A;   VIDEO BRONCHOSCOPY WITH ENDOBRONCHIAL ULTRASOUND N/A 07/27/2021   Procedure: VIDEO BRONCHOSCOPY WITH ENDOBRONCHIAL ULTRASOUND;  Surgeon: Loreli Slot, MD;  Location: [facility] OR;  Service: Thoracic;  Laterality: N/A;    REVIEW OF SYSTEMS:  Constitutional: positive for fatigue and night sweats Eyes: negative Ears, nose, mouth, throat, and face: negative Respiratory: positive for cough and dyspnea on exertion Cardiovascular: negative Gastrointestinal: negative Genitourinary:negative Integument/breast: negative Hematologic/lymphatic:  negative Musculoskeletal:positive for muscle weakness Neurological: negative Behavioral/Psych: negative Endocrine: negative Allergic/Immunologic: negative   PHYSICAL EXAMINATION: General appearance: alert, cooperative, fatigued, and no distress Head: Normocephalic, without obvious abnormality, atraumatic Neck: no adenopathy, no JVD, supple, symmetrical, trachea midline, and thyroid not enlarged, symmetric, no tenderness/mass/nodules Lymph nodes: Cervical, supraclavicular, and axillary nodes normal. Resp: rales LUL Back: symmetric, no curvature. ROM normal. No CVA tenderness. Cardio: regular rate and rhythm, S1, S2 normal, no murmur, click, rub or gallop GI: soft, non-tender; bowel sounds normal; no masses,  no organomegaly Extremities: extremities normal, atraumatic, no cyanosis or edema Neurologic: Alert and oriented X 3, normal strength and tone. Normal symmetric reflexes. Normal coordination and gait  ECOG PERFORMANCE STATUS: 1 - Symptomatic but completely ambulatory  Blood pressure 114/60, pulse 75, temperature 97.6 F (36.4 C), temperature source Oral, resp. rate 18, height 5\' 4"  (1.626 m), SpO2 100 %.  LABORATORY DATA: Lab Results  Component Value Date   WBC 16.9 (H) 08/25/2022   HGB 7.7 (L) 08/27/2022   HCT 26.0 (L) 09/03/2022   MCV 81.8 09/18/2022  PLT 434 (H) 09/06/2022      Chemistry      Component Value Date/Time   NA 141 06/17/2022 1018   NA 145 (H) 04/19/2022 0944   K 3.7 06/17/2022 1018   CL 98 06/17/2022 1018   CO2 37 (H) 06/17/2022 1018   BUN 12 06/17/2022 1018   BUN 9 04/19/2022 0944   CREATININE 0.91 06/17/2022 1018   CREATININE 0.81 07/02/2016 1323      Component Value Date/Time   CALCIUM 9.9 06/17/2022 1018   ALKPHOS 69 06/17/2022 1018   AST 29 06/17/2022 1018   ALT 35 06/17/2022 1018   BILITOT 0.4 06/17/2022 1018       RADIOGRAPHIC STUDIES: No results found.  ASSESSMENT AND PLAN: This is a very pleasant 77 years old white female with  Stage IIb/IIIa (T3, N0/N2, M0) non-small cell lung cancer, squamous cell carcinoma presented with left hilar mass with suspicious mediastinal lymphadenopathy on the last CT scan of the chest at [facility] health. She also has a history of stage Ia non-small cell lung cancer, adenocarcinoma status post surgical resection in July 2012 as well as recurrent stage Ia non-small cell lung cancer, squamous cell carcinoma status post SBRT completed January 30, 2021. The patient completed a course of concurrent chemoradiation with weekly carboplatin for AUC of 2 and paclitaxel 45 Mg/M2 status post 6 cycles.   She underwent consolidation treatment with immunotherapy with Imfinzi 1500 Mg IV every 4 weeks.  Status post 4 cycle started on 8/31 2023. The patient has been tolerating this treatment well except for the recent admission to the hospital with worsening dyspnea that was suspicious to be secondary to either pulmonary edema versus infection versus drug-induced pneumonitis from amiodarone or Imfinzi. Her treatment with amiodarone was discontinued. The patient has been in observation since that time. Her CT scan of the chest on 06/17/2022 showed increasing right hilar lymph node.  There was increasing left-sided pleural effusion and consolidative lung opacity and additional 2 areas of interstitial septal thickening and groundglass. I did order a PET scan which was performed on 07/25/2022 and that showed evidence for interval disease progression with new tracer avid nodule within the right upper lobe worrisome for either metastatic disease or new primary bronchogenic carcinoma.  There was also tracer avid bilateral kidney lesions increasing in size and concerning for either renal metastasis versus bilateral primary renal neoplasm but giving the rapid interval increase in size this favored to represent metastatic disease.  There was also multifocal tracer avid left retroperitoneal lymph nodes compatible with nodal  metastasis.  The patient also incidentally had tracer avid soft tissue nodule within the left side of the pelvis along the left vaginal cuff worrisome for malignancy and diffuse increased FDG uptake associated with the consolidated left lung that is nonspecific and may reflect underlying inflammatory/infectious process but underlying tumor cannot be excluded. She is scheduled for bronchoscopy tomorrow to confirm her diagnosis.  I noticed that she also has elevation of the liver enzyme on the blood work performed today concerning for possible liver involvement.  Comprehensive metabolic panel also showed significant increase in her BUN and creatinine.  I would recommend for the patient to go to the emergency department for evaluation of her acute renal failure and also for the persistent anemia. She may be able to get her bronchoscopy done during her hospitalization. I will see her back for follow-up visit in around 10 days for evaluation and discussion of her treatment options based on the biopsy results.  The patient voices understanding of current disease status and treatment options and is in agreement with the current care plan.  All questions were answered. The patient knows to call the clinic with any problems, questions or concerns. We can certainly see the patient much sooner if necessary.  The total time spent in the appointment was 30 minutes.  Disclaimer: This note was dictated with voice recognition software. Similar sounding words can inadvertently be transcribed and may not be corrected upon review.

## 2022-08-26 NOTE — Progress Notes (Signed)
Attempted SDW call for bronchoscopy with Dr Delton Coombes on Tuesday 08/25/2022.  Spoke with patients husband and she is on her way to the Emergency room via EMS due to abnormal blood results.  Per her husband, patient is scheduled to be admitted.

## 2022-08-26 NOTE — Assessment & Plan Note (Signed)
Obtain urine electrolytes gently rehydrate Noted elevated CK may respond to IV fluids.  Discussed case with urology given bilateral renal masses Dr Annabell Howells:  "I think she does have some hydro on the left probably from the mass. It is particularly apparent in the upper pole. The lower pole is more compressed. Not sure why radiology didn't mention it. The right is draining well. with unilateral obstruction, I wouldn't expect that big a jump in Cr. On the PET scan from June 6th there was tracer in the right ureter all the way to the bladder but on the left there is not and the dilated collecting system on the left had no tracer uptake so this is not acute obstruction. With the elevated CK, it would be reasonable to treat with hydration initially but if she doesn't respond the placement of a left ureteral stent could be considered. Would you mind putting in a formal consult request with our hospital NP, Elmon Kirschner, in the morning."  Keep n.p.o. post breakfast in case may need to stenting

## 2022-08-26 NOTE — Consult Note (Signed)
Nephrology Consult   Assessment/Recommendations:   AKI -b/l Cr WNL -AKI likely related to ATN (+granular casts) from recent UTI/pyelo and renal involvement from metastatic disease.   -possible left sided obstruction. Urology to see patient and if does not respond to fluids then may need left ureteral stent. Likely not an acute obstruction -trial of fluids: LR 100cc/hr x 24 hrs. Would recommend having a low threshold on stopping fluids if volume status worsens -No acute indication for renal replacement therapy as of right now. Before considering renal replacement therapy, would recommend a palliative consultation first given presence of advanced malignancy -uric acid, LDH pending -Avoid nephrotoxic medications including NSAIDs and iodinated intravenous contrast exposure unless the latter is absolutely indicated.  Preferred narcotic agents for pain control are hydromorphone, fentanyl, and methadone. Morphine should not be used. Avoid Baclofen and avoid oral sodium phosphate and magnesium citrate based laxatives / bowel preps. Continue strict Input and Output monitoring. Will monitor the patient closely with you and intervene or adjust therapy as indicated by changes in clinical status/labs   UTI, leukocytosis -abx per primary service  Hyperkalemia -improved, monitor for now, if K rising then recommend lokelma  Non small cell lung ca -follows with Dr. Arbutus Ped. Concern for interval disease progression/metastasis  Chronic respiratory failure, h/o possible pneumonitis, COPD -on baseline 4L [place] at home  HTN -BP on the softer side, IVF as above. Monitor for now  Anemia -transfuse PRN for Hgb <7. FOBT pending. Avoid ESAs given concern for metastatic disease  Elevated LFTs -trend LFTs, work up per primary  Edema -watch carefully with fluids, possibly related to hypoalbuminemia  Hypoalbuminemia -push protein   Will follow along, can keep at [facility] for now.   Anthony Sar [facility] 09/05/2022 9:14 PM   _____________________________________________________________________________________   History of Present Illness: [name] is a/an 77 y.o. 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 She was planning on having a bronch done tomorrow given concern for disease progression. However, her labs today revealed a Cr of. 6.66 and a Hgb down to 7.7 therefore sent here. She apparently was in [place] and was recently diagnosed with a UTI and she started an antibiotic about 2 days ago. K initially was 5.2, down to 4.7 on repeat labs here. Hgb 7.3. Patient seen and examined in the ER. Patient reports that she was diagnosed with a UTI since her niece had a UTI and it was decided that she should be treated, was started on Keflex. She was asymptomatic at that time. She has been reporting some loss of appetite (ongoing issue) but does feel weaker than usual. She also has been having swelling in her legs but attributed to not moving around as much. She reports that she very seldom takes Aleve for migraines (maybe once every 3 weeks or so). She does report that her legs feel shaky when she tries to stand. In regards to SOB, she does notice worsening DOE. Denies any chest pain, intractable itchiness/hiccups, diminished urinary frequency, N/V/D, dysgeusia.   Medications:  No current facility-administered medications for this encounter.   Current Outpatient Medications  Medication Sig Dispense Refill   acetaminophen (TYLENOL) 500 MG tablet Take 500 mg by mouth every 6 (six) hours as needed for headache or fever.     arformoterol (BROVANA) 15 MCG/2ML NEBU Take 2 mLs (  15 mcg total) by nebulization 2 (two) times daily. (Patient taking differently: Take 15 mcg by nebulization daily.) 120 mL 11    aspirin 81 MG tablet Take 81 mg by mouth at bedtime.     budesonide (PULMICORT) 0.5 MG/2ML nebulizer solution Take 2 mLs (0.5 mg total) by nebulization 2 (two) times daily. 120 mL 11   cephALEXin (KEFLEX) 500 MG capsule Take 500 mg by mouth 2 (two) times daily.     Coenzyme Q10 (CO Q 10 PO) Take 1 capsule by mouth daily.     furosemide (LASIX) 20 MG tablet Take 1 tablet (20 mg total) by mouth daily. 90 tablet 1   levothyroxine (SYNTHROID) 25 MCG tablet Take 0.5 tablets (12.5 mcg total) by mouth daily. In addition to the 50 mcg. 45 tablet 1   levothyroxine (SYNTHROID) 50 MCG tablet TAKE 1 TABLET BY MOUTH DAILY ALONG WITH 12.5 MCG EXCEPT ON SUNDAY TAKE 50 MCG (Patient taking differently: Take 50 mcg by mouth daily before breakfast.) 90 tablet 1   lidocaine-prilocaine (EMLA) cream Apply 1 Application topically as needed. (Patient taking differently: Apply 1 Application topically as needed (pain).) 30 g 2   metoprolol succinate (TOPROL-XL) 100 MG 24 hr tablet TAKE 1 TABLET BY MOUTH DAILY. TAKE WITH OR IMMEDIATELY FOLLOWING A MEAL. (Patient taking differently: Take 50 mg by mouth daily. TAKE WITH OR IMMEDIATELY FOLLOWING A MEAL.) 90 tablet 1   naproxen sodium (ALEVE) 220 MG tablet Take 220 mg by mouth 2 (two) times daily as needed (pain).     Omega-3 Fatty Acids (OMEGA-3 PO) Take 1,000 mg by mouth in the morning and at bedtime.     potassium chloride (KLOR-CON M10) 10 MEQ tablet Take 1 tablet (10 mEq total) by mouth daily. (Patient taking differently: Take 10 mEq by mouth every Monday, Wednesday, and Friday.) 90 tablet 1   revefenacin (YUPELRI) 175 MCG/3ML nebulizer solution INHALE VIA NEBULIZER DAILY 90 mL 11   rosuvastatin (CRESTOR) 40 MG tablet Take 1 tablet (40 mg total) by mouth daily. 90 tablet 3     ALLERGIES Amiodarone and Codeine  MEDICAL HISTORY Past Medical History:  Diagnosis Date   CAD (coronary artery disease)    Cancer (HCC)    COPD (chronic obstructive pulmonary disease) (HCC)     per DM note, pt denies   Dyslipidemia    GERD (gastroesophageal reflux disease)    History of radiation therapy    Left lung- 01/16/21-01/30/21- Dr. Antony Blackbird   History of radiation therapy    Left lung- 08/02/21-09/18/21- Dr. Antony Blackbird   HTN (hypertension)    Hypothyroidism    Myocardial infarct [facility])    Non-small cell lung cancer (HCC) 08/19/2010   Stage IA, status post left upper lobectomy July 2012   Tobacco abuse      SOCIAL HISTORY Social History   Socioeconomic History   Marital status: Married    Spouse name: Not on file   Number of children: Not on file   Years of education: Not on file   Highest education level: Not on file  Occupational History   Not on file  Tobacco Use   Smoking status: Former    Packs/day: 1.00    Years: 58.00    Additional pack years: 0.00    Total pack years: 58.00    Types: Cigarettes    Quit date: 07/19/2021    Years since quitting: 1.1   Smokeless tobacco: Former   Tobacco comments:    Former smoker 02/19/22  Vaping Use   Vaping Use: Never used  Substance and Sexual Activity   Alcohol use: No    Alcohol/week: 0.0 standard drinks of alcohol   Drug use: No   Sexual activity: Never  Other Topics Concern   Not on file  Social History Narrative   Not on file   Social Determinants of Health   Financial Resource Strain: Low Risk  (02/12/2022)   Overall Financial Resource Strain (CARDIA)    Difficulty of Paying Living Expenses: Not very hard  Food Insecurity: No Food Insecurity (01/28/2022)   Hunger Vital Sign    Worried About Running Out of Food in the Last Year: Never true    Ran Out of Food in the Last Year: Never true  Transportation Needs: No Transportation Needs (02/12/2022)   PRAPARE - Administrator, Civil Service (Medical): No    Lack of Transportation (Non-Medical): No  Physical Activity: Inactive (01/28/2022)   Exercise Vital Sign    Days of Exercise per Week: 0 days    Minutes of Exercise per  Session: 0 min  Stress: No Stress Concern Present (01/28/2022)   [employer] of Occupational Health - Occupational Stress Questionnaire    Feeling of Stress : Not at all  Social Connections: Socially Isolated (01/28/2022)   Social Connection and Isolation Panel [NHANES]    Frequency of Communication with Friends and Family: Once a week    Frequency of Social Gatherings with Friends and Family: Once a week    Attends Religious Services: Never    Database administrator or Organizations: No    Attends Banker Meetings: Never    Marital Status: Married  Catering manager Violence: Not At Risk (01/28/2022)   Humiliation, Afraid, Rape, and Kick questionnaire    Fear of Current or Ex-Partner: No    Emotionally Abused: No    Physically Abused: No    Sexually Abused: No     FAMILY HISTORY Family History  Problem Relation Age of Onset   Hypothyroidism Father    Heart attack Father    Aplastic anemia Maternal Grandfather    Stroke Paternal Grandfather      Review of Systems: 12 systems reviewed Otherwise as per HPI, all other systems reviewed and negative  Physical Exam: Vitals:   08/22/2022 1737 09/14/2022 2000  BP: (!) 112/54 (!) 115/56  Pulse: 82 77  Resp: 16 18  Temp: 98.4 F (36.9 C)   SpO2: 95% 98%   No intake/output data recorded. No intake or output data in the 24 hours ending  2114 General: well-appearing, no acute distress HEENT: anicteric sclera, oropharynx clear without lesions CV: regular rate, normal rhythm, no murmurs, no gallops, no rubs Lungs: diminished air entry bibasilar, normal work of breathing Abd: soft, non-tender, non-distended Skin: no visible lesions or rashes Psych: alert, engaged, appropriate mood and affect Musculoskeletal: trace to 1+ pitting edema b/l LEs Neuro: normal speech, no gross focal deficits, tremulous but no asterixis or myoclonic jerking observed   Test Results Reviewed Lab Results  Component Value Date    NA 136 09/04/2022   K 4.7 09/15/2022   CL 103 08/19/2022   CO2 22 09/18/2022   BUN 115 (H) 09/03/2022   CREATININE 6.17 (H) 08/25/2022   GFR 41.80 (L) 03/20/2022   CALCIUM 8.0 (L) 09/16/2022   ALBUMIN 2.0 (L) 08/22/2022   PHOS 7.8 (H) 08/25/2022     I have reviewed all relevant outside healthcare records related to the patient's kidney injury.

## 2022-08-26 NOTE — Assessment & Plan Note (Signed)
Allow permissive hypertension 

## 2022-08-26 NOTE — Assessment & Plan Note (Signed)
Resume metoprolol if able to tolerate not on anticoagulation continue to monitor hold aspirin given progressive anemia

## 2022-08-26 NOTE — Progress Notes (Signed)
Pharmacy Antibiotic Note  [name] is a 77 y.o. female admitted on 08/25/2022 with  with medical history significant of lung cancer, CAD status post CABG, COPD on 4.5-5 L, HLD, GERD, hypothyroidism, hypertension, history of tobacco abuse . Pt presents with AKI.  Pharmacy has been consulted for zosyn dosing for HCAP.  Plan: Zosyn 2.25gm IV q6h Follow renal function and clinical course  Weight: 96 kg (211 lb 10.3 oz)  Temp (24hrs), Avg:98 F (36.7 C), Min:97.6 F (36.4 C), Max:98.4 F (36.9 C)  Recent Labs  Lab 08/21/2022 1356 08/25/2022 1849 09/13/2022 2121  WBC 16.9* 18.5* 18.3*  CREATININE 6.66* 6.17*  --     Estimated Creatinine Clearance: 8.9 mL/min (A) (by C-G formula based on SCr of 6.17 mg/dL (H)).    Allergies  Allergen Reactions   Amiodarone Shortness Of Breath    Contraindicated for patients on chemotherapy or immunotherapy    Codeine Other (See Comments)    Unknown reaction Patient avoids this medication     Thank you for allowing pharmacy to be a part of this patient's care.  Arley Phenix RPh 09/04/2022, 11:09 PM

## 2022-08-26 NOTE — Assessment & Plan Note (Signed)
Suspect postobstructive pNAstart on zosyn  Would benefirt from pulmonology consult in AM

## 2022-08-26 NOTE — Assessment & Plan Note (Addendum)
Noted elevated LFTs elevated CK Question if dehydration related CT scan showed no evidence of hepatic involvement If does not improve may need father workup and GI consult Order hepatitis serologies for completion Given RUQ tenderness  Ordered RUQ [place]

## 2022-08-26 NOTE — Assessment & Plan Note (Signed)
Rehydrate given AKI and continue to follow CK avoid over aggressive fluid resuscitation to avoid fluid overload

## 2022-08-26 NOTE — Subjective & Objective (Signed)
Patient with known non-small cell lung cancer status postresection 2012 and with recurrence history of COPD chronically on 6 L recent PET scan showed evidence of disease progression either metastatic disease or new primary bronchogenic carcinoma History of CAD status post CABG Patient was referred to pulmonology for bronchoscopy Patient was seen by Dr. Arbutus Ped and had preliminary tests done that showed significant abnormalities elevated LFTs elevation BUN and creatinine she was referred to emergency department for further care and evaluation with plan to try to have her bronchoscopy done during hospitalization

## 2022-08-26 NOTE — ED Notes (Signed)
Blood and flood delay due to lack of port kits. Patient refuses peripheral IV. Will access ports when kit arrives to the floor.

## 2022-08-26 NOTE — Assessment & Plan Note (Signed)
May benefit from thoracentesis  Pulmonology consult in AM Albumin administration

## 2022-08-26 NOTE — ED Triage Notes (Signed)
Patient presents from home post MD appointment. She went to see her MD because she was fatigued over there past week. Around 1600 today, her MD told her she was in kidney failure and her RBC's and hemoglobin was low. She reports no change in urine output.    HX Lung Cancer, 4 L O2 baseline  EMS vitals: 126/78 BP 24 RR 84 HR 93 % SPO2 on 4 L O2 nasal canula

## 2022-08-26 NOTE — Assessment & Plan Note (Signed)
Obtain anemia panel Hemoccult stool transfuse if patient is symptomatic or hemoglobin below 7 I expect hemoglobin to continue to drift down after IV fluid administration

## 2022-08-26 NOTE — Assessment & Plan Note (Signed)
Elevated TSH May need to bump up the dose if patient has been compliant current dose of Synthroid 65.5 mcg

## 2022-08-26 NOTE — Telephone Encounter (Signed)
CRITICAL VALUE STICKER  CRITICAL VALUE:AST=[id number]  RECEIVER (on-site recipient of call):Alexxa Sabet  DATE & TIME NOTIFIED: 09/13/2022 @ 1524  MESSENGER (representative from lab): Creola Corn , RN MD NOTIFIED: Arbutus Ped  TIME OF NOTIFICATION:1545  RESPONSE:  Send pt to ED . Also her creatinine is 6.66

## 2022-08-27 ENCOUNTER — Inpatient Hospital Stay (HOSPITAL_COMMUNITY): Payer: Medicare HMO

## 2022-08-27 ENCOUNTER — Ambulatory Visit (HOSPITAL_COMMUNITY): Admission: RE | Admit: 2022-08-27 | Payer: Medicare HMO | Source: Home / Self Care | Admitting: Emergency Medicine

## 2022-08-27 ENCOUNTER — Encounter (HOSPITAL_COMMUNITY): Payer: Self-pay | Admitting: Internal Medicine

## 2022-08-27 ENCOUNTER — Encounter (HOSPITAL_COMMUNITY): Admission: EM | Disposition: E | Payer: Self-pay | Source: Home / Self Care | Attending: Internal Medicine

## 2022-08-27 ENCOUNTER — Encounter: Payer: Self-pay | Admitting: Internal Medicine

## 2022-08-27 DIAGNOSIS — N179 Acute kidney failure, unspecified: Secondary | ICD-10-CM | POA: Diagnosis not present

## 2022-08-27 DIAGNOSIS — E8809 Other disorders of plasma-protein metabolism, not elsewhere classified: Secondary | ICD-10-CM | POA: Diagnosis present

## 2022-08-27 DIAGNOSIS — C3490 Malignant neoplasm of unspecified part of unspecified bronchus or lung: Secondary | ICD-10-CM | POA: Diagnosis not present

## 2022-08-27 DIAGNOSIS — R9389 Abnormal findings on diagnostic imaging of other specified body structures: Secondary | ICD-10-CM

## 2022-08-27 DIAGNOSIS — J9621 Acute and chronic respiratory failure with hypoxia: Secondary | ICD-10-CM | POA: Diagnosis not present

## 2022-08-27 LAB — CBC
HCT: 28.1 % — ABNORMAL LOW (ref 36.0–46.0)
HCT: 31.9 % — ABNORMAL LOW (ref 36.0–46.0)
Hemoglobin: 8 g/dL — ABNORMAL LOW (ref 12.0–15.0)
Hemoglobin: 9.3 g/dL — ABNORMAL LOW (ref 12.0–15.0)
MCH: 24.2 pg — ABNORMAL LOW (ref 26.0–34.0)
MCH: 24.9 pg — ABNORMAL LOW (ref 26.0–34.0)
MCHC: 28.5 g/dL — ABNORMAL LOW (ref 30.0–36.0)
MCHC: 29.2 g/dL — ABNORMAL LOW (ref 30.0–36.0)
MCV: 84.9 fL (ref 80.0–100.0)
MCV: 85.3 fL (ref 80.0–100.0)
Platelets: 389 10*3/uL (ref 150–400)
Platelets: 386 10*3/uL (ref 150–400)
RBC: 3.31 MIL/uL — ABNORMAL LOW (ref 3.87–5.11)
RBC: 3.74 MIL/uL — ABNORMAL LOW (ref 3.87–5.11)
RDW: 19 % — ABNORMAL HIGH (ref 11.5–15.5)
RDW: 19.1 % — ABNORMAL HIGH (ref 11.5–15.5)
WBC: 17.6 10*3/uL — ABNORMAL HIGH (ref 4.0–10.5)
WBC: 18.6 10*3/uL — ABNORMAL HIGH (ref 4.0–10.5)
nRBC: 0 % (ref 0.0–0.2)
nRBC: 0 % (ref 0.0–0.2)

## 2022-08-27 LAB — URINALYSIS, COMPLETE (UACMP) WITH MICROSCOPIC
Bilirubin Urine: NEGATIVE
Glucose, UA: NEGATIVE mg/dL
Ketones, ur: NEGATIVE mg/dL
Leukocytes,Ua: NEGATIVE
Nitrite: NEGATIVE
Protein, ur: 30 mg/dL — AB
Specific Gravity, Urine: 1.009 (ref 1.005–1.030)
pH: 5 (ref 5.0–8.0)

## 2022-08-27 LAB — MRSA NEXT GEN BY PCR, NASAL: MRSA by PCR Next Gen: NOT DETECTED

## 2022-08-27 LAB — COMPREHENSIVE METABOLIC PANEL
ALT: 192 U/L — ABNORMAL HIGH (ref 0–44)
AST: 202 U/L — ABNORMAL HIGH (ref 15–41)
Albumin: 2.2 g/dL — ABNORMAL LOW (ref 3.5–5.0)
Alkaline Phosphatase: 114 U/L (ref 38–126)
Anion gap: 14 (ref 5–15)
BUN: 107 mg/dL — ABNORMAL HIGH (ref 8–23)
CO2: 22 mmol/L (ref 22–32)
Calcium: 8.2 mg/dL — ABNORMAL LOW (ref 8.9–10.3)
Chloride: 102 mmol/L (ref 98–111)
Creatinine, Ser: 6.04 mg/dL — ABNORMAL HIGH (ref 0.44–1.00)
GFR, Estimated: 7 mL/min — ABNORMAL LOW (ref >60)
Glucose, Bld: 89 mg/dL (ref 70–99)
Potassium: 5.3 mmol/L — ABNORMAL HIGH (ref 3.5–5.1)
Sodium: 138 mmol/L (ref 135–145)
Total Bilirubin: 0.4 mg/dL (ref 0.3–1.2)
Total Protein: 6.2 g/dL — ABNORMAL LOW (ref 6.5–8.1)

## 2022-08-27 LAB — OSMOLALITY: Osmolality: 325 mOsm/kg (ref 275–295)

## 2022-08-27 LAB — OSMOLALITY, URINE: Osmolality, Ur: 277 mOsm/kg — ABNORMAL LOW (ref 300–900)

## 2022-08-27 LAB — STREP PNEUMONIAE URINARY ANTIGEN: Strep Pneumo Urinary Antigen: NEGATIVE

## 2022-08-27 LAB — TYPE AND SCREEN: Unit division: 0

## 2022-08-27 LAB — LACTIC ACID, PLASMA
Lactic Acid, Venous: 0.6 mmol/L (ref 0.5–1.9)
Lactic Acid, Venous: 0.7 mmol/L (ref 0.5–1.9)

## 2022-08-27 LAB — BPAM RBC
Blood Product Expiration Date: 202408062359
ISSUE DATE / TIME: 202407090612
Unit Type and Rh: 9500

## 2022-08-27 LAB — CK
Total CK: 1950 U/L — ABNORMAL HIGH (ref 38–234)
Total CK: 1222 U/L — ABNORMAL HIGH (ref 38–234)

## 2022-08-27 LAB — BLOOD GAS, VENOUS
Acid-base deficit: 6.5 mmol/L — ABNORMAL HIGH (ref 0.0–2.0)
Bicarbonate: 22.8 mmol/L (ref 20.0–28.0)
O2 Saturation: 81 %
Patient temperature: 37
pCO2, Ven: 61 mmHg — ABNORMAL HIGH (ref 44–60)
pH, Ven: 7.18 — CL (ref 7.25–7.43)
pO2, Ven: 55 mmHg — ABNORMAL HIGH (ref 32–45)

## 2022-08-27 LAB — CREATININE, URINE, RANDOM: Creatinine, Urine: 42 mg/dL

## 2022-08-27 LAB — HEPATITIS PANEL, ACUTE
HCV Ab: NONREACTIVE
Hep A IgM: NONREACTIVE
Hep B C IgM: NONREACTIVE
Hepatitis B Surface Ag: NONREACTIVE

## 2022-08-27 LAB — PREALBUMIN: Prealbumin: 5 mg/dL — ABNORMAL LOW (ref 18–38)

## 2022-08-27 LAB — SARS CORONAVIRUS 2 BY RT PCR: SARS Coronavirus 2 by RT PCR: NEGATIVE

## 2022-08-27 LAB — PREPARE RBC (CROSSMATCH)

## 2022-08-27 LAB — PHOSPHORUS: Phosphorus: 9.8 mg/dL — ABNORMAL HIGH (ref 2.5–4.6)

## 2022-08-27 LAB — SODIUM, URINE, RANDOM: Sodium, Ur: 34 mmol/L

## 2022-08-27 LAB — MAGNESIUM: Magnesium: 1.7 mg/dL (ref 1.7–2.4)

## 2022-08-27 LAB — AMMONIA: Ammonia: 18 umol/L (ref 9–35)

## 2022-08-27 SURGERY — CANCELLED PROCEDURE
Anesthesia: General

## 2022-08-27 MED ORDER — SODIUM CHLORIDE 0.9% FLUSH
10.0000 mL | Freq: Two times a day (BID) | INTRAVENOUS | Status: DC
  Administered 2022-08-28: 10 mL

## 2022-08-27 MED ORDER — CHLORHEXIDINE GLUCONATE CLOTH 2 % EX PADS
6.0000 | MEDICATED_PAD | Freq: Every day | CUTANEOUS | Status: DC
  Administered 2022-08-27 – 2022-08-28 (×2): 6 via TOPICAL

## 2022-08-27 MED ORDER — SODIUM CHLORIDE 0.9% FLUSH: 10.0000 mL | INTRAVENOUS | Status: DC | PRN

## 2022-08-27 NOTE — Progress Notes (Signed)
PT Cancellation Note  Patient Details Name: [name] MRN: [id number] DOB: February 08, 1946   Cancelled Treatment:    Reason Eval/Treat Not Completed: Other (comment), defer PT at this time-noted plan is possibly home with hospice-Palliative Care consult pending-await GOC   [facility] 09/05/2022, 3:07 PM

## 2022-08-27 NOTE — Consult Note (Signed)
NAME:  [name], MRN:  [id number], DOB:  04-19-1945, LOS: 1 ADMISSION DATE:  08/21/2022, CONSULTATION DATE:  7/9 REFERRING MD:  Jonathon Bellows, CHIEF COMPLAINT:  worsening hypoxia    History of Present Illness:  77 year old female w/ h/o stage IIIa NSLCA w/ prior resection of LUL back in 2012 and then disease reoccurrence in 23 s/p chemo/XRT who started to show radiographic evidence of disease progression back in June 2024 involving new nodules of the RUL, bilateral renal masses and Retroperitoneal disease. She was to under Bx at the request of Onc to evaluate the RUL disease but over the last few weeks has had increased exertional hypoxia requiring increased supplemental oxygen as high as 8 liters w/ exertion. She was seen by Onc on 7/8 and lab eval also showed new worsening liver fxn and Acute renal failure she was advised to come to the hospital.  In ER CT abd/pelvis was obtained that showed increased perinephric edema, progressive metastatic disease of left retroperitoneal adenopathy, RLL nodules and increase bilateral renal masses. Her serum creatinine was up to 6.66 from baseline of 0.9. abd US showed complex cyst w/in left lobe of liver. On CT chest imaging there was now almost complete opacification w/ mix of PNA and loculated effusion (Chronic) of the left hemithorax and multiple new right sided lung nodules Pulmonary was asked to see for her worsening hypoxia  (Incidentally she was scheduled for bronchoscopy by Dr Delton Coombes on 7/9) Pertinent  Medical History  Lung cancer initially diagnosed in 2012, for which underwent lung resection (left upper lobe) with recurrence in 2022.  Currently stage IIb/III 3A non-small cell lung cancer, squamous cell carcinoma.  Underwent concurrent chemoradiation therapy for 6 cycles and 4 cycles of immunotherapy back in 2023 Possible drug-induced pneumonitis from either amiodarone or Imfinzi PET scan demonstrating disease progression with right upper lobe nodule in June  2024 with possible metastasis to kidneys, also concern about retroperitoneal metastasis Prior pleural effusion Coronary artery disease Cancer Dyslipidemia GERD Hypothyroidism Prior MI  Significant Hospital Events: Including procedures, antibiotic start and stop dates in addition to other pertinent events   7/8 admitted w/ worsening LFTs, AKI , progressive hypoxia all in the context of worsening metastatic lung cancer   Interim History / Subjective:  No distress  Objective   Blood pressure (Abnormal) 106/55, pulse 67, temperature 97.8 F (36.6 C), temperature source Oral, resp. rate 19, weight 96 kg, SpO2 97 %.       No intake or output data in the 24 hours ending 08/23/2022 0806 Filed Weights   09/15/2022 2300  Weight: 96 kg    Examination: General: 77 year old female resting in bed. No distress HENT: NCAT no JVD MMM Lungs: dec on left> right. Currently 5 LPM.  Cardiovascular: RRR no MRG Abdomen: soft  Extremities: pitting edema pulses palp  Neuro: awake and alert GU: yellow   Resolved Hospital Problem list     Assessment & Plan:  NSCLCA stage IIIb w/ evidence of disease progression AKI likely ATN from recent UTI and also c/b metastatic disease Mild hydronephrosis of left kidney  Acute on chronic hypoxic respiratory failure 2/2 disease progression of lung cancer w/ associated worsening atelectasis  COPD HTN Anemia  New ALF Edema   Pulm prob list Acute on chronic hypoxic respiratory failure 2/2 disease progression of NSCLCA w/ progressive post-obstructive atelectasis & progressive disease on right. All superimposed on recurrent NSCLC. At this point her degree of hypoxia and new AKI would prevent her  from being a candidate for any sort of salvage therapy. Also given her degree of hypoxia she would be an exceedingly high risk for general anesthesia w/ primary concern that we would not be able to liberate her from vent. This is not a risk she would like to take. Only  treatable thing from our stand point is if there is some element of PNA on the left accounting for worsening Left hemithorax aeration.  Plan/rec Cont empiric abx Supplemental oxygen  Agree w/ patient she is high risk for vent dependence should she undergo general anesthesia and at this point feel best to recommend against it Cont BDs Needs hospice   AKI. Prob multifactorial. Recent UTI, metastatic disease progression. Has some degree of left hydro but urology doesn't seem convinced it accounts for degree of AKI Plan Cont IV hydration Defer possible per nephrostomy tube to urology however would advise against this if she were to require general anesthesia for this or really even heavy sedation   All other issues per IM service.  Again strongly recommend palliative care and consideration for home hospice at this point.   Best Practice (right click and "Reselect all SmartList Selections" daily)   Per primary   Labs   CBC: Recent Labs  Lab 09/13/2022 1356  1849 09/10/2022 2121 09/16/2022 0619  WBC 16.9* 18.5* 18.3* 17.6*  NEUTROABS 13.6* 15.1* 15.0*  --   HGB 7.7* 7.3* 7.3* 8.0*  HCT 26.0* 25.0* 25.0* 28.1*  MCV 81.8 81.4 82.2 84.9  PLT 434* 435* 424* 389    Basic Metabolic Panel: Recent Labs  Lab 08/30/2022 1356 09/03/2022 1849 08/26/2022 0619  NA 140 136 138  K 5.2* 4.7 5.3*  CL 102 103 102  CO2 25 22 22   GLUCOSE 79 106* 89  BUN 111* 115* 107*  CREATININE 6.66* 6.17* 6.04*  CALCIUM 8.8* 8.0* 8.2*  MG  --   --  1.7  PHOS  --  7.8* 9.8*   GFR: Estimated Creatinine Clearance: 9 mL/min (A) (by C-G formula based on SCr of 6.04 mg/dL (H)). Recent Labs  Lab 08/20/2022 0005 08/24/2022 1356 09/13/2022 1849 09/09/2022 2121 08/19/2022 0232 08/25/2022 0619  PROCALCITON  --   --  0.49  --   --   --   WBC  --  16.9* 18.5* 18.3*  --  17.6*  LATICACIDVEN 0.7  --   --   --  0.6  --     Liver Function Tests: Recent Labs  Lab 09/16/2022 1356  1849 08/24/2022 0619  AST 208*   --  202*  ALT 205*  --  192*  ALKPHOS 147*  --  114  BILITOT 0.3  --  0.4  PROT 6.8  --  6.2*  ALBUMIN 2.6* 2.0* 2.2*   No results for input(s): "LIPASE", "AMYLASE" in the last 168 hours. Recent Labs  Lab 09/14/2022 0004  AMMONIA 18    ABG    Component Value Date/Time   PHART 7.48 (H) 01/28/2022 1544   PCO2ART 54 (H) 01/28/2022 1544   PO2ART 122 (H) 01/28/2022 1544   HCO3 22.8 09/13/2022 0137   TCO2 33 (H) 01/28/2022 1624   ACIDBASEDEF 6.5 (H) 08/31/2022 0137   O2SAT 81 09/04/2022 0137     Coagulation Profile: Recent Labs  Lab 08/19/2022 1849  INR 1.3*    Cardiac Enzymes: Recent Labs  Lab 09/10/2022 1849 08/30/2022 2121  CKTOTAL 1,956* 1,950*    HbA1C: Hgb A1c MFr Bld  Date/Time Value Ref Range Status  04/19/2022  09:44 AM 5.8 (H) 4.8 - 5.6 % Final    Comment:             Prediabetes: 5.7 - 6.4          Diabetes: >6.4          Glycemic control for adults with diabetes: <7.0   02/02/2022 02:49 AM 6.4 (H) 4.8 - 5.6 % Final    Comment:    (NOTE)         Prediabetes: 5.7 - 6.4         Diabetes: >6.4         Glycemic control for adults with diabetes: <7.0     CBG: No results for input(s): "GLUCAP" in the last 168 hours.  Review of Systems:   Review of Systems  Constitutional:  Negative for fever.  HENT: Negative.    Eyes: Negative.   Respiratory:  Positive for shortness of breath.   Cardiovascular:  Positive for leg swelling. Negative for chest pain.  Gastrointestinal: Negative.   Genitourinary:  Positive for flank pain.  Skin: Negative.      Past Medical History:  She,  has a past medical history of CAD (coronary artery disease), Cancer (HCC), COPD (chronic obstructive pulmonary disease) (HCC), Dyslipidemia, GERD (gastroesophageal reflux disease), History of radiation therapy, History of radiation therapy, HTN (hypertension), Hypothyroidism, Myocardial infarct (HCC), Non-small cell lung cancer (HCC) (08/19/2010), and Tobacco abuse.   Surgical History:    Past Surgical History:  Procedure Laterality Date   CARDIAC CATHETERIZATION  07/06/2010   see CABG report - pt sent to OR   CARDIOVASCULAR STRESS TEST  09/11/2010   R/S MV - normal perfusion in all regions, EF 46%, no scintigraphic evidence of inducible myocardial ischemia; global LV systolic function mildly reduced; no significant wall motion abnormalities noted; Exercise capacity 7 METS; EKG negative for ischemia; low risk study, no signifcant change from previous study 08/2003   CORONARY ARTERY BYPASS GRAFT  07/11/2010   LIMA to LAD; SVG to 2nd branch OM; SVG to posterior descending artery   IR IMAGING GUIDED PORT INSERTION  08/16/2021   LEFT VATS  09/17/2010   TEE WITHOUT CARDIOVERSION  07/06/2010   during emergent CABG surgery; 2-3+ mitral regurgitation   VIDEO BRONCHOSCOPY N/A 07/27/2021   Procedure: VIDEO BRONCHOSCOPY;  Surgeon: Loreli Slot, MD;  Location[facility];  Service: Thoracic;  Laterality: N/A;   VIDEO BRONCHOSCOPY WITH ENDOBRONCHIAL ULTRASOUND N/A 07/27/2021   Procedure: VIDEO BRONCHOSCOPY WITH ENDOBRONCHIAL ULTRASOUND;  Surgeon: Loreli Slot, MD;  Location: [facility] OR;  Service: Thoracic;  Laterality: N/A;     Social History:   reports that she quit smoking about 13 months ago. Her smoking use included cigarettes. She has a 58.00 pack-year smoking history. She has quit using smokeless tobacco. She reports that she does not drink alcohol and does not use drugs.   Family History:  Her family history includes Aplastic anemia in her maternal grandfather; Heart attack in her father; Hypothyroidism in her father; Stroke in her paternal grandfather.   Allergies Allergies  Allergen Reactions   Amiodarone Shortness Of Breath    Contraindicated for patients on chemotherapy or immunotherapy    Codeine Other (See Comments)    Unknown reaction Patient avoids this medication     Home Medications  Prior to Admission medications   Medication Sig Start Date End Date Taking?  Authorizing Provider  acetaminophen (TYLENOL) 500 MG tablet Take 500 mg by mouth every 6 (six) hours as needed for  headache or fever.   Yes [provider]  arformoterol (BROVANA) 15 MCG/2ML NEBU Take 2 mLs (15 mcg total) by nebulization 2 (two) times daily. Patient taking differently: Take 15 mcg by nebulization daily. 03/13/22  Yes Nyoka Cowden, MD  aspirin 81 MG tablet Take 81 mg by mouth at bedtime.   Yes [provider]  budesonide (PULMICORT) 0.5 MG/2ML nebulizer solution Take 2 mLs (0.5 mg total) by nebulization 2 (two) times daily. 05/24/22  Yes Nyoka Cowden, MD  cephALEXin (KEFLEX) 500 MG capsule Take 500 mg by mouth 2 (two) times daily. 08/24/22  Yes [provider]  Coenzyme Q10 (CO Q 10 PO) Take 1 capsule by mouth daily.   Yes [provider]  furosemide (LASIX) 20 MG tablet Take 1 tablet (20 mg total) by mouth daily. 07/29/22  Yes Boscia, Kathlynn Grate, NP  levothyroxine (SYNTHROID) 25 MCG tablet Take 0.5 tablets (12.5 mcg total) by mouth daily. In addition to the 50 mcg. 07/29/22  Yes Boscia, Kathlynn Grate, NP  levothyroxine (SYNTHROID) 50 MCG tablet TAKE 1 TABLET BY MOUTH DAILY ALONG WITH 12.5 MCG EXCEPT ON SUNDAY TAKE 50 MCG Patient taking differently: Take 50 mcg by mouth as directed. 05/01/22  Yes [name], NP  lidocaine-prilocaine (EMLA) cream Apply 1 Application topically as needed. Patient taking differently: Apply 1 Application topically as needed (pain). 08/06/21  Yes Heilingoetter, Cassandra L, PA-C  metoprolol succinate (TOPROL-XL) 100 MG 24 hr tablet TAKE 1 TABLET BY MOUTH DAILY. TAKE WITH OR IMMEDIATELY FOLLOWING A MEAL. Patient taking differently: Take 50 mg by mouth daily. TAKE WITH OR IMMEDIATELY FOLLOWING A MEAL. 08/09/22  Yes Boscia, Heather E, NP  naproxen sodium (ALEVE) 220 MG tablet Take 220 mg by mouth 2 (two) times daily as needed (pain).   Yes [provider]  Omega-3 Fatty Acids (OMEGA-3 PO) Take 1,000 mg by mouth in  the morning and at bedtime.   Yes [provider]  potassium chloride (KLOR-CON M10) 10 MEQ tablet Take 1 tablet (10 mEq total) by mouth daily. Patient taking differently: Take 10 mEq by mouth every Monday, Wednesday, and Friday. 07/29/22  Yes [name], NP  revefenacin (YUPELRI) 175 MCG/3ML nebulizer solution INHALE VIA NEBULIZER DAILY 08/07/22  Yes Nyoka Cowden, MD  rosuvastatin (CRESTOR) 40 MG tablet Take 1 tablet (40 mg total) by mouth daily. 09/27/21  Yes [name], NP     Critical care time: NA   Simonne Martinet ACNP-BC [facility] Pulmonary/Critical Care Pager # [phone] OR # [phone] if no answer

## 2022-08-27 NOTE — ED Notes (Signed)
Pt awake and oriented. Pt refusing bipap. Md aware

## 2022-08-27 NOTE — Consult Note (Signed)
Urology Consult Note   Requesting Attending Physician:  Lanae Boast, MD Service Providing Consult: Urology  Consulting Attending: Dr. Marlou Porch   Reason for Consult:  AKI with possible obstructive uropathy  HPI: [name] is seen in consultation for reasons noted above at the request of Lanae Boast, MD   This is a 77 y.o. female with acute renal failure, hyperkalemia, azotemia, leukocytosis, and acute on chronic anemia.  PMH significant for CAD, COPD, HLD, GERD, HTN, atrial fibrillation, UTI, and lung cancer.  Patient is s/p lobe resection in 2012 and remained on chemotherapy until last year.  After starting amiodarone with new diagnosis of atrial fibrillation, she unfortunately was suffered significant fibrotic changes.  She has 4-5 L oxygen at baseline.  Recently it appears that she is experiencing recurrence of her lung cancer and was scheduled for bronchoscopy and biopsy today.  Lab work done during an office visit prompted direction to the emergency department.  While in the ED CT A/P showed bilateral renal masses metastases with interval increase in size.  [facility] was consulted to speak to abnormal CT findings in the context of acute renal failure.   Past Medical History: Past Medical History:  Diagnosis Date   CAD (coronary artery disease)    Cancer (HCC)    COPD (chronic obstructive pulmonary disease) (HCC)    per DM note, pt denies   Dyslipidemia    GERD (gastroesophageal reflux disease)    History of radiation therapy    Left lung- 01/16/21-01/30/21- Dr. Antony Blackbird   History of radiation therapy    Left lung- 08/02/21-09/18/21- Dr. Antony Blackbird   HTN (hypertension)    Hypothyroidism    Myocardial infarct [facility])    Non-small cell lung cancer (HCC) 08/19/2010   Stage IA, status post left upper lobectomy July 2012   Tobacco abuse     Past Surgical History:  Past Surgical History:  Procedure Laterality Date   CARDIAC CATHETERIZATION  07/06/2010   see CABG  report - pt sent to OR   CARDIOVASCULAR STRESS TEST  09/11/2010   R/S MV - normal perfusion in all regions, EF 46%, no scintigraphic evidence of inducible myocardial ischemia; global LV systolic function mildly reduced; no significant wall motion abnormalities noted; Exercise capacity 7 METS; EKG negative for ischemia; low risk study, no signifcant change from previous study 08/2003   CORONARY ARTERY BYPASS GRAFT  07/11/2010   LIMA to LAD; SVG to 2nd branch OM; SVG to posterior descending artery   IR IMAGING GUIDED PORT INSERTION  08/16/2021   LEFT VATS  09/17/2010   TEE WITHOUT CARDIOVERSION  07/06/2010   during emergent CABG surgery; 2-3+ mitral regurgitation   VIDEO BRONCHOSCOPY N/A 07/27/2021   Procedure: VIDEO BRONCHOSCOPY;  Surgeon: Loreli Slot, MD;  Location: [facility] OR;  Service: Thoracic;  Laterality: N/A;   VIDEO BRONCHOSCOPY WITH ENDOBRONCHIAL ULTRASOUND N/A 07/27/2021   Procedure: VIDEO BRONCHOSCOPY WITH ENDOBRONCHIAL ULTRASOUND;  Surgeon: Loreli Slot, MD;  Location: [facility] OR;  Service: Thoracic;  Laterality: N/A;    Medication: Current Facility-Administered Medications  Medication Dose Route Frequency Provider Last Rate Last Admin   acetaminophen (TYLENOL) tablet 650 mg  650 mg Oral Q6H PRN Doutova, Anastassia, MD       Or   acetaminophen (TYLENOL) suppository 650 mg  650 mg Rectal Q6H PRN Doutova, Anastassia, MD       albuterol (PROVENTIL) (2.5 MG/3ML) 0.083% nebulizer solution 2.5 mg  2.5 mg Nebulization Q2H PRN Therisa Doyne, MD  arformoterol (BROVANA) nebulizer solution 15 mcg  15 mcg Nebulization Daily Doutova, Anastassia, MD   15 mcg at 09/15/2022 0729   budesonide (PULMICORT) nebulizer solution 0.5 mg  0.5 mg Nebulization BID Doutova, Anastassia, MD   0.5 mg at 09/09/2022 0734   guaiFENesin (MUCINEX) 12 hr tablet 600 mg  600 mg Oral BID Therisa Doyne, MD   600 mg at 09/13/2022 2254   HYDROcodone-acetaminophen (NORCO/VICODIN) 5-325 MG per tablet 1-2 tablet   1-2 tablet Oral Q4H PRN Therisa Doyne, MD   1 tablet at 09/03/2022 2254   levothyroxine (SYNTHROID) tablet 75 mcg  75 mcg Oral Q0600 Therisa Doyne, MD   75 mcg at 09/02/2022 0531   ondansetron (ZOFRAN) tablet 4 mg  4 mg Oral Q6H PRN Therisa Doyne, MD       Or   ondansetron (ZOFRAN) injection 4 mg  4 mg Intravenous Q6H PRN Doutova, Anastassia, MD   4 mg at 09/05/2022 2256   piperacillin-tazobactam (ZOSYN) IVPB 2.25 g  2.25 g Intravenous Q6H [facility], [facility]   Stopped at  0203   Current Outpatient Medications  Medication Sig Dispense Refill   acetaminophen (TYLENOL) 500 MG tablet Take 500 mg by mouth every 6 (six) hours as needed for headache or fever.     arformoterol (BROVANA) 15 MCG/2ML NEBU Take 2 mLs (15 mcg total) by nebulization 2 (two) times daily. (Patient taking differently: Take 15 mcg by nebulization daily.) 120 mL 11   aspirin 81 MG tablet Take 81 mg by mouth at bedtime.     budesonide (PULMICORT) 0.5 MG/2ML nebulizer solution Take 2 mLs (0.5 mg total) by nebulization 2 (two) times daily. 120 mL 11   cephALEXin (KEFLEX) 500 MG capsule Take 500 mg by mouth 2 (two) times daily.     Coenzyme Q10 (CO Q 10 PO) Take 1 capsule by mouth daily.     furosemide (LASIX) 20 MG tablet Take 1 tablet (20 mg total) by mouth daily. 90 tablet 1   levothyroxine (SYNTHROID) 25 MCG tablet Take 0.5 tablets (12.5 mcg total) by mouth daily. In addition to the 50 mcg. 45 tablet 1   levothyroxine (SYNTHROID) 50 MCG tablet TAKE 1 TABLET BY MOUTH DAILY ALONG WITH 12.5 MCG EXCEPT ON SUNDAY TAKE 50 MCG (Patient taking differently: Take 50 mcg by mouth as directed.) 90 tablet 1   lidocaine-prilocaine (EMLA) cream Apply 1 Application topically as needed. (Patient taking differently: Apply 1 Application topically as needed (pain).) 30 g 2   metoprolol succinate (TOPROL-XL) 100 MG 24 hr tablet TAKE 1 TABLET BY MOUTH DAILY. TAKE WITH OR IMMEDIATELY FOLLOWING A MEAL. (Patient taking differently:  Take 50 mg by mouth daily. TAKE WITH OR IMMEDIATELY FOLLOWING A MEAL.) 90 tablet 1   naproxen sodium (ALEVE) 220 MG tablet Take 220 mg by mouth 2 (two) times daily as needed (pain).     Omega-3 Fatty Acids (OMEGA-3 PO) Take 1,000 mg by mouth in the morning and at bedtime.     potassium chloride (KLOR-CON M10) 10 MEQ tablet Take 1 tablet (10 mEq total) by mouth daily. (Patient taking differently: Take 10 mEq by mouth every Monday, Wednesday, and Friday.) 90 tablet 1   revefenacin (YUPELRI) 175 MCG/3ML nebulizer solution INHALE VIA NEBULIZER DAILY 90 mL 11   rosuvastatin (CRESTOR) 40 MG tablet Take 1 tablet (40 mg total) by mouth daily. 90 tablet 3    Allergies: Allergies  Allergen Reactions   Amiodarone Shortness Of Breath    Contraindicated for patients  on chemotherapy or immunotherapy    Codeine Other (See Comments)    Unknown reaction Patient avoids this medication    Social History: Social History   Tobacco Use   Smoking status: Former    Packs/day: 1.00    Years: 58.00    Additional pack years: 0.00    Total pack years: 58.00    Types: Cigarettes    Quit date: 07/19/2021    Years since quitting: 1.1   Smokeless tobacco: Former   Tobacco comments:    Former smoker 02/19/22  Vaping Use   Vaping Use: Never used  Substance Use Topics   Alcohol use: No    Alcohol/week: 0.0 standard drinks of alcohol   Drug use: No    Family History Family History  Problem Relation Age of Onset   Hypothyroidism Father    Heart attack Father    Aplastic anemia Maternal Grandfather    Stroke Paternal Grandfather     Review of Systems  Constitutional:  Positive for malaise/fatigue. Negative for chills and fever.  Respiratory:  Positive for shortness of breath.   Genitourinary:  Negative for dysuria, flank pain, frequency, hematuria and urgency.       Reduced urinary output     Objective   Vital signs in last 24 hours: BP (!) 106/55   Pulse 67   Temp 97.8 F (36.6 C) (Oral)    Resp 19   Wt 96 kg   SpO2 97%   BMI 36.33 kg/m   Physical Exam General: NAD, A&O, resting, appropriate HEENT: [place]/AT Pulmonary: Normal work of breathing Cardiovascular: RRR, no cyanosis Abdomen: Soft, NTTP, nondistended Neuro: Appropriate, no focal neurological deficits  Most Recent Labs: Lab Results  Component Value Date   WBC 17.6 (H) 09/17/2022   HGB 8.0 (L) 09/04/2022   HCT 28.1 (L) 09/10/2022   PLT 389 09/04/2022    Lab Results  Component Value Date   NA 138 08/21/2022   K 5.3 (H) 09/18/2022   CL 102 08/26/2022   CO2 22 08/25/2022   BUN 107 (H) 09/01/2022   CREATININE 6.04 (H) 08/30/2022   CALCIUM 8.2 (L) 08/22/2022   MG 1.7 09/08/2022   PHOS 9.8 (H) 08/26/2022    Lab Results  Component Value Date   INR 1.3 (H) 09/18/2022   APTT 28 07/25/2021     Urine Culture: @[id number] (laburin,org,[id number])@   IMAGING: US Abdomen Limited RUQ (LIVER/GB)  Result Date: 09/15/2022 CLINICAL DATA:  Elevated liver function test. EXAM: ULTRASOUND ABDOMEN LIMITED RIGHT UPPER QUADRANT COMPARISON:  None Available. FINDINGS: Gallbladder: Numerous shadowing echogenic gallstones are seen within the gallbladder lumen. The largest measures approximately 1.2 cm. The gallbladder wall measures 2.3 mm in thickness. No sonographic Murphy sign noted by sonographer. Common bile duct: Diameter: 5.8 mm Liver: 1.4 cm x 1.3 cm x 1.5 cm thinly septated cyst is seen within the left lobe of the liver. A similar appearing lesion is seen within this region on the prior study. Within normal limits in parenchymal echogenicity. Portal vein is patent on color Doppler imaging with normal direction of blood flow towards the liver. Other: None. IMPRESSION: 1. Cholelithiasis. 2. Predominantly stable, complex hepatic cyst within the left lobe. Electronically Signed   By: Aram Candela M.D.   On: 08/19/2022 00:19   CT CHEST WO CONTRAST  Result Date: 09/03/2022 CLINICAL DATA:  Known or suspected pleural  effusion. Non-small cell lung cancer patient undergoing treatment. EXAM: CT CHEST WITHOUT CONTRAST TECHNIQUE: Multidetector CT imaging of the chest was performed  following the standard protocol without IV contrast. RADIATION DOSE REDUCTION: This exam was performed according to the departmental dose-optimization program which includes automated exposure control, adjustment of the mA and/or kV according to patient size and/or use of iterative reconstruction technique. COMPARISON:  PET-CT 07/25/2022, chest CT with contrast 06/17/2022. FINDINGS: Cardiovascular: There is a right chest MediPort with access needle in place, IJ approach catheter again terminates just above the superior cavoatrial junction. There is mild cardiomegaly with CABG changes chronically seen, heavy native three-vessel coronary plaques and atherosclerosis in the aorta and great vessels without aneurysm. Pulmonary arteries and veins are normal caliber. Mediastinum/Nodes: Chronic left-sided volume loss and mediastinal shift. There is a mildly enlarged azygoesophageal recess lymph node measuring 1.6 x 1.2 cm, previously 1.6 x 0.9 cm. Shotty subcentimeter in short axis prevascular nodes are very slightly more prominent than previously, for example a lymph node anterior to the brachiocephalic arch to the right is 7.3 mm short axis, previously 5 mm. There is a lymph node in between the SVC and innominate artery origin which is 0.8 cm in short axis today and was previously 5 mm short axis. There is no appreciable bulky or encasing adenopathy although assessment of the hilar structures is limited without contrast. There is no axillary or supraclavicular adenopathy. Unremarkable thoracic esophagus. Unremarkable thyroid gland. Lungs/Pleura: There is centrilobular emphysema. As above there is left-sided volume loss possibly postsurgical. Extensive consolidation continues to be seen in left mid to lower lung field and moderate loculated pleural effusion was  apical, anterior and basilar components is not significantly changed. Scar-like opacities are again shown in the left upper lung field. There is a 1.6 cm right upper lobe solid nodule which was previously 1 cm now demonstrating partial cavitation. There are additional scattered new nodules, for example in the anterior right upper lobe base there are 3 new nodules measuring 0.9 cm on 4:58, 5 mm on 4:66, and 4 mm on 4:52. There is a new right middle lobe nodule measuring 6 mm on 4:74 with new right lower lobe nodules largest is 1.5 cm on 4:91, another in the anterior basal segment measures 1 cm on 4:88 and there are small subcentimeter right lower lobe nodules on 4:80 on 4:104. Findings are consistent with progressive metastatic disease. Background subpleural chronic reticulation is noted. No right pleural effusion. Upper Abdomen: Heterogeneous renal masses are larger than last month's PET-CT. Anterior interpolar right renal mass measures 4.2 cm on 2:61, previously 3.1 cm. Posterior left mid pole mass is 5.5 cm on 3:63, incompletely seen but previously no more than 3.1 cm. No liver mass is seen without contrast. There is cholelithiasis without wall thickening. Musculoskeletal: No destructive bone lesions. Osteopenia and degenerative change of the spine. Chronic healed median sternotomy. No appreciable chest wall mass. IMPRESSION: 1. Multiple new right-sided lung nodules consistent with progressive metastatic disease. 2. Chronic left-sided volume loss and consolidation in the left mid to lower lung field with moderate loculated left pleural effusion, not significantly changed. 3. Slight interval enlargement of azygoesophageal recess lymph node and small prevascular lymph nodes. 4. Enlarging bilateral renal masses. 5. Aortic and coronary artery atherosclerosis. 6. Emphysema. 7. Cholelithiasis. Aortic Atherosclerosis (ICD10-I70.0) and Emphysema (ICD10-J43.9). Electronically Signed   By: Almira Bar M.D.   On:  08/22/2022 23:59   DG Chest 2 View  Result Date:  CLINICAL DATA:  Hypoxia EXAM: CHEST - 2 VIEW COMPARISON:  02/20/2022, PET CT 02/23/2022 FINDINGS: Right-sided central venous port tip over the cavoatrial region. Post sternotomy changes.  Progressive airspace disease and pleural fluid on the left with essentially complete opacification of left thorax. Emphysema. No acute airspace disease on the right. IMPRESSION: Progressive airspace disease and pleural fluid on the left with essentially complete opacification of left thorax. Emphysema. Electronically Signed   By: Jasmine Pang M.D.   On: 09/06/2022 22:14   CT ABDOMEN PELVIS WO CONTRAST  Result Date: 09/07/2022 CLINICAL DATA:  Diffuse abdominal pain. Patient with history of lung cancer. EXAM: CT ABDOMEN AND PELVIS WITHOUT CONTRAST TECHNIQUE: Multidetector CT imaging of the abdomen and pelvis was performed following the standard protocol without IV contrast. RADIATION DOSE REDUCTION: This exam was performed according to the departmental dose-optimization program which includes automated exposure control, adjustment of the mA and/or kV according to patient size and/or use of iterative reconstruction technique. COMPARISON:  PET CT 07/25/2022, chest CT 06/17/2022 abdominal CT 04/25/2022 FINDINGS: Lower chest: Chronic left pleural effusion and basilar opacification. Multiple pulmonary nodules in the right lung base. Largest measures 14 mm series 5, image 20. 9 mm right lower lobe nodule, series 5, image 12. 6 mm pleural based right lower lobe nodule series 5, image 32. These nodules appear increased from prior exams. Baseline fibrotic changes. Hepatobiliary: The low-density liver lesions on prior contrast-enhanced exam are not well-defined in the absence of IV contrast. Unremarkable gallbladder. No biliary dilatation. Pancreas: No ductal dilatation or inflammation. Spleen: Normal in size without focal abnormality. Adrenals/Urinary Tract: No adrenal nodule.  Heterogeneous hypodense mass in the medial left kidney measures approximately 5.1 cm, FDG avid on prior PET. There is a 4.2 cm right renal mass in the anterior mid kidney that is increased in size from prior abdominal CT, series 2, image 31. Bilateral perinephric fat stranding. No hydronephrosis. Calcifications at the renal hila felt to be vascular. Unremarkable urinary bladder. Stomach/Bowel: Detailed bowel assessment is limited in the absence of contrast. The stomach is partially distended, normal for degree of distension. There is no bowel obstruction or inflammation. Normal appendix visualized small to moderate colonic stool burden. Mild sigmoid diverticulosis without diverticulitis. Vascular/Lymphatic: Aortic atherosclerosis. Increasing left retroperitoneal nodal tissue it is now contiguous with the left psoas muscle. This is poorly defined in the absence of IV contrast, however measures approximately 3.5 x 2.6 cm series 2, image 39. Additional left retroperitoneal nodes at the level of the left kidney measuring 11 and 12 mm. Reproductive: Soft tissue thickening in the left vaginal apex series 2, image 79 corresponding to FDG avid lesion on prior PET. This is increased, approximately 3.4 cm series 2, image 79, previously 2.5 cm. Other: Increasing bilateral perinephric stranding. No ascites. No abdominal wall hernia. Musculoskeletal: No suspicious bone lesion. No acute osseous findings. IMPRESSION: 1. Increasing perinephric edema is nonspecific. This may be progressing chronic change or infection. 2. Progressive metastatic disease with increasing left retroperitoneal adenopathy and enlarging right lower lobe pulmonary nodules. 3. Increasing size of bilateral renal masses, favored to be metastatic rather than primary renal malignancy. 4. Increasing soft tissue associated with the left vaginal apex, likely neoplastic. Aortic Atherosclerosis (ICD10-I70.0). Electronically Signed   By: Narda Rutherford M.D.   On:  09/02/2022 19:48    ------  Assessment:  77 y.o. female with ARF, bilateral renal masses- likely mets, and retroperitoneal lymphadenopathy   Recommendations: Possibly some hydro on the left side per Dr. Annabell Howells. Very mild and unlikely to be responsible for this degree of kidney failure. If legitimate obstruction is present from extrinsic compression of ureter, it could worsen once patient is volume  resuscitated. Agree with gentle rehydration and will collect renal US tomorrow. Will discuss ureteral stent placement at that time. There is a low likelihood of acute obstruction and in consideration of her significantly compromised respiratory status, there would have to be significant benefit over present risk to suggest anesthesia.  We also discussed possibility of PCNT if drainage was necessary and pt could not tolerate anesthesia.   Nephrology following  Will follow along with you and re-evaluate tomorrow. No surgical intervention scheduled for today. Will make her NPO at midnight.   Case and plan discussed with Dr. Sharmon Leyden, NP Pager: [phone]

## 2022-08-27 NOTE — Assessment & Plan Note (Signed)
In the setting of AKI Patient technically does not meet criteria for tumor lysis syndrome. Ashby Dawes of her malignancy make it somewhat less likely 2.  Appreciate oncology see her in the morning.  Appreciate nephrology consult most likely because of hyperphosphatemia is renal failure

## 2022-08-27 NOTE — Progress Notes (Signed)
PROGRESS NOTE [name]  ZOX:[id number] DOB: 06/24/1945 DOA: 09/16/2022 PCP: Sandre Kitty, MD  Brief Narrative/Hospital Course: 77 y.o. female with medical history significant of lung cancer, CAD status post CABG, COPD on 6l [place], HLD, GERD, hypothyroidism, hypertension, history of tobacco abuse who was referred to pulmonology for bronchoscopy, was seen by Dr. Diana Eves and had preliminary test done that showed significant abnormalities with elevated LFTs, renal failure and sent to the ED for further evaluation and to consider bronchoscopy.  In the ED: Afebrile BP stable in 110, not tachycardic or tachypneic Labs repeat shows potassium down from 5.2-4.7, creatinine 6.17 from 6.611 2.0, CK1 950 LDL 348 iron low at 18 saturation 7% lactic acid normal, leukocytosis 18.3 hemoglobin 7.3 g platelet 424, TSH 9.1 INR 1.3 UA WBC 0-5 large LE .  VBG showed> acidosis with pH 7.18 pCO2 61 pO2 55 CXR>progressive airspace disease and pleural fluid on the left with essentially complete opacification of the left thorax, emphysema CT abdomen/pelvis>: Increasing perinephric edema is nonspecific. This may be progressing chronic change or infection. Progressive metastatic disease with increasing left retroperitoneal adenopathy and enlarging right lower lobe pulmonary nodules. Increasing size of bilateral renal masses, favored to be metastatic rather than primary renal malignancy.Increasing soft tissue associated with the left vaginal apex, likely neoplastic. CT chest> Multiple new right-sided lung nodules consistent with progressive metastatic disease. Chronic left-sided volume loss and consolidation in the left mid to lower lung field with moderate loculated left pleural effusion, not significantly changed.Slight interval enlargement of azygoesophageal recess lymph node and small prevascular lymph nodes.Enlarging bilateral renal masses.Aortic and coronary artery atherosclerosis. Emphysema. Cholelithiasis. RUQ [place] no  cholecystitis  Nephrology, urology, PCCM was consulted and admitted for further management.     Subjective: Patient seen and examined this morning. Feels weak, Able to speak in full sentence providing detailed history Pulmonary team also arrived and we discussed at the bedside   Assessment and Plan: Principal Problem:   AKI (acute kidney injury) (HCC) Active Problems:   Paroxysmal atrial fibrillation with RVR (HCC)   Chronic congestive heart failure (HCC)   HTN (hypertension)   Acute on chronic respiratory failure with hypoxia and hypercapnia (HCC)   CAD (coronary artery disease)   Hypothyroidism   Mixed hyperlipidemia   Mass of left kidney   Anemia   Transaminitis   Pneumonia   Pleural effusion   Hyperphosphatemia   Hypoalbuminemia   Acute kidney injury: Likely related to ATN from recent UTI/pyelo and renal involvement from metastatic disease possible left-sided obstruction.  Urology has been consulted, nephrology following closely possibly may need left ureteral stent-but patient at risk of respiratory compromise given her metastatic lung cancer/respiratory failure-urology following and possibly PCN down the line but likely not an acute obstruction.  Doing trial of IV fluid hydration monitor intake output avoid nephrotoxic medications NSAIDs. Recent Labs  Lab 09/01/2022 1356 08/24/2022 1849 08/30/2022 0619  BUN 111* 115* 107*  CREATININE 6.66* 6.17* 6.04*    Acute on chronic respiratory failure with hypoxia and hypercapnia: At baseline 4 to 5 L nasal cannula, worsening of her respiratory status in the setting of metastatic lung cancer with disease progression.  At risk of anesthesia.  Continue supplemental oxygen  Metastatic lung cancer Mass of left kidney-likely metastatic disease Pleural effusion-likely malignant: She has chronic opacification of the left side, CT abdomen pelvis chest shows increased right lung nodules indicating progressive disease.  Per pulmonary high  risk for bronchoscopy and patient does not want the procedure either if  it carries risk of respiratory failure.  Per pulmonary oncology should be informed and she would be agreeable to home with hospice.  Palliative care consulted.  I relayed message to Dr. Arbutus Ped.  Leukocytosis Suspected UTI Suspected postobstructive pneumonia in the setting of metastatic cancer: Continue Zosyn empirically for now  CAD: Chronic stable hold aspirin given progressive anemia   Anemia: Likely due to malignancy transfusing and hb coming up-at 8 g, and still getting her lats unir of prbc.  Less likely she is candidate for invasive procedure.   Transaminitis: Suspect in the setting of patient's metastatic disease.  CT abdomen pelvis with right upper quadrant ultrasound done-> see result    Hypothyroidism Has elevated TSH, continue her current home dose of Synthroid.  Rechecking TSH as outpatient before adjusting given her acute illness   Mixed hyperlipidemiac: Cont to hold Crestor given elevated LFTs   Hyperphosphatemia Trend  Hypoalbuminemia Suspect malnutrition:RD consulted  Goals of care: DNR, we discussed extensively continue what is treatable and anticipating home with hospice, message relayed to Dr. Arbutus Ped and the palliative care has been consulted.  Patient remains high risk for decompensation, readmission.  Obesity:Patient's Body mass index is 36.33 kg/m. : Will benefit with PCP follow-up, weight loss  healthy lifestyle and outpatient sleep evaluation.   DVT prophylaxis: SCDs Start: 09/12/2022 2136 Code Status:   Code Status: DNR Family Communication: plan of care discussed with patient/PCCM at bedside. Patient status is: Inpatient because of respiratory failure Level of care: Progressive   Dispo: The patient is from: Home            Anticipated disposition: Likely home with hospice  Objective: Vitals last 24 hrs: Vitals:   09/05/2022 0642 09/03/2022 0700 09/09/2022 0729 09/12/2022 0935  BP:  (!) 109/53 (!) 106/55  108/60  Pulse: 68 67  73  Resp: 19 19  (!) 23  Temp: 97.8 F (36.6 C)   (!) 97.5 F (36.4 C)  TempSrc: Oral   Oral  SpO2: 98% 96% 97% 91%  Weight:       Weight change:   Physical Examination: General exam: alert awake, older than stated age HEENT:Oral mucosa moist, Ear/Nose WNL grossly Respiratory system: Decreased breath sounds on left clear on right Cardiovascular system: S1 & S2 +, No JVD. Gastrointestinal system: Abdomen soft,NT,ND, BS+ Nervous System:Alert, awake, moving extremities. Extremities: LE edema +,distal peripheral pulses palpable.  Skin: No rashes,no icterus. MSK: Normal muscle bulk,tone, power  Medications reviewed:  Scheduled Meds:  arformoterol  15 mcg Nebulization Daily   budesonide  0.5 mg Nebulization BID   guaiFENesin  600 mg Oral BID   levothyroxine  75 mcg Oral Q0600   Continuous Infusions:  piperacillin-tazobactam (ZOSYN)  IV Stopped (08/31/2022 1006)      Diet Order     None      Intake/Output Summary (Last 24 hours) at  1023 Last data filed at 09/17/2022 0915 Gross per 24 hour  Intake 315 ml  Output --  Net 315 ml   Net IO Since Admission: 315 mL [08/23/2022 1023]  Wt Readings from Last 3 Encounters:  09/02/2022 96 kg  08/09/22 93.4 kg  08/07/22 92.5 kg     Unresulted Labs (From admission, onward)     Start     Ordered   09/02/2022 1000  CBC  Now then every 6 hours,   R (with TIMED occurrences)     Question:  Release to patient  Answer:  Immediate   09/06/2022 0129   08/30/2022 0500  Prealbumin  Tomorrow morning,   R        09/07/2022 2056   09/01/2022 0500  Hepatitis panel, acute  Tomorrow morning,   R        08/20/2022 2129   09/03/2022 0000  CK  Now then every 8 hours,   R (with TIMED, URGENT occurrences)     Question:  Release to patient  Answer:  Immediate    2106   09/18/2022 2304  Expectorated Sputum Assessment w Gram Stain, Rflx to Resp Cult  Once,   R       Question:  Patient immune status   Answer:  Normal   09/02/2022 2304   08/30/2022 2304  Legionella Pneumophila Serogp 1 Ur Ag  Once,   R        08/31/2022 2304   09/01/2022 2057  CBC with Differential/Platelet  Add-on,   AD       Question:  Release to patient  Answer:  Immediate   09/04/2022 2056   08/22/2022 2057  Occult blood card to lab, stool  Once,   URGENT        09/04/2022 2056          Data Reviewed: I have personally reviewed following labs and imaging studies CBC: Recent Labs  Lab 09/01/2022 1356 09/10/2022 1849 08/30/2022 2121 09/11/2022 0619 09/09/2022 0930  WBC 16.9* 18.5* 18.3* 17.6* 18.6*  NEUTROABS 13.6* 15.1* 15.0*  --   --   HGB 7.7* 7.3* 7.3* 8.0* 9.3*  HCT 26.0* 25.0* 25.0* 28.1* 31.9*  MCV 81.8 81.4 82.2 84.9 85.3  PLT 434* 435* 424* 389 386   Basic Metabolic Panel: Recent Labs  Lab 08/23/2022 1356 09/11/2022 1849 09/05/2022 0619  NA 140 136 138  K 5.2* 4.7 5.3*  CL 102 103 102  CO2 25 22 22   GLUCOSE 79 106* 89  BUN 111* 115* 107*  CREATININE 6.66* 6.17* 6.04*  CALCIUM 8.8* 8.0* 8.2*  MG  --   --  1.7  PHOS  --  7.8* 9.8*   GFR: Estimated Creatinine Clearance: 9 mL/min (A) (by C-G formula based on SCr of 6.04 mg/dL (H)). Liver Function Tests: Recent Labs  Lab 09/05/2022 1356 08/31/2022 1849 09/07/2022 0619  AST 208*  --  202*  ALT 205*  --  192*  ALKPHOS 147*  --  114  BILITOT 0.3  --  0.4  PROT 6.8  --  6.2*  ALBUMIN 2.6* 2.0* 2.2*   No results for input(s): "LIPASE", "AMYLASE" in the last 168 hours. Recent Labs  Lab 09/01/2022 0004  AMMONIA 18   Coagulation Profile: Recent Labs  Lab 09/13/2022 1849  INR 1.3*   BNP (last 3 results) Recent Labs    03/20/22 1424  PROBNP 84.0   Recent Labs    08/24/2022 1849  TSH 9.165*   Sepsis Labs: Recent Labs  Lab 09/08/2022 0005 09/17/2022 1849 08/20/2022 0232  PROCALCITON  --  0.49  --   LATICACIDVEN 0.7  --  0.6    Recent Results (from the past 240 hour(s))  MRSA Next Gen by PCR, Nasal     Status: None   Collection Time: 08/21/2022  2:19 AM    Specimen: Anterior Nasal Swab  Result Value Ref Range Status   MRSA by PCR Next Gen NOT DETECTED NOT DETECTED Final    Comment: (NOTE) The GeneXpert MRSA Assay (FDA approved for NASAL specimens only), is one component of a comprehensive MRSA colonization surveillance program. It is not intended to  diagnose MRSA infection nor to guide or monitor treatment for MRSA infections. Test performance is not FDA approved in patients less than 33 years old. Performed at [facility], [address], [place], [place] [address]   SARS Coronavirus 2 by RT PCR (hospital order, performed in [facility] hospital lab) *cepheid single result test* Anterior Nasal Swab     Status: None   Collection Time: 09/09/2022  2:19 AM   Specimen: Anterior Nasal Swab  Result Value Ref Range Status   SARS Coronavirus 2 by RT PCR NEGATIVE NEGATIVE Final    Comment: (NOTE) SARS-CoV-2 target nucleic acids are NOT DETECTED.  The SARS-CoV-2 RNA is generally detectable in upper and lower respiratory specimens during the acute phase of infection. The lowest concentration of SARS-CoV-2 viral copies this assay can detect is 250 copies / mL. A negative result does not preclude SARS-CoV-2 infection and should not be used as the sole basis for treatment or other patient management decisions.  A negative result may occur with improper specimen collection / handling, submission of specimen other than nasopharyngeal swab, presence of viral mutation(s) within the areas targeted by this assay, and inadequate number of viral copies (<250 copies / mL). A negative result must be combined with clinical observations, patient history, and epidemiological information.  Fact Sheet for Patients:   RoadLapTop.co.za  Fact Sheet for Healthcare Providers: http://kim-miller.com/  This test is not yet approved or  cleared by the [place] FDA and has been authorized for  detection and/or diagnosis of SARS-CoV-2 by FDA under an Emergency Use Authorization (EUA).  This EUA will remain in effect (meaning this test can be used) for the duration of the COVID-19 declaration under Section 564(b)(1) of the Act, 21 U.S.C. section 360bbb-3(b)(1), unless the authorization is terminated or revoked sooner.  Performed at [facility], [address] [place], [place] [address]     Antimicrobials: Anti-infectives (From admission, onward)    Start     Dose/Rate Route Frequency Ordered Stop   08/24/2022 0000  piperacillin-tazobactam (ZOSYN) IVPB 2.25 g        2.25 g 100 mL/hr over 30 Minutes Intravenous Every 6 hours 09/11/2022 2307        Culture/Microbiology    Component Value Date/Time   SDES BLOOD BLOOD RIGHT WRIST 08/08/2021 2332   SPECREQUEST  08/08/2021 2332    BOTTLES DRAWN AEROBIC AND ANAEROBIC Blood Culture adequate volume   CULT  08/08/2021 2332    NO GROWTH 5 DAYS Performed at [facility], [address], [place], [place] [address]    REPTSTATUS 08/13/2021 FINAL 08/08/2021 2332    Radiology Studies: US Abdomen Limited RUQ (LIVER/GB)  Result Date: 09/15/2022 CLINICAL DATA:  Elevated liver function test. EXAM: ULTRASOUND ABDOMEN LIMITED RIGHT UPPER QUADRANT COMPARISON:  None Available. FINDINGS: Gallbladder: Numerous shadowing echogenic gallstones are seen within the gallbladder lumen. The largest measures approximately 1.2 cm. The gallbladder wall measures 2.3 mm in thickness. No sonographic Murphy sign noted by sonographer. Common bile duct: Diameter: 5.8 mm Liver: 1.4 cm x 1.3 cm x 1.5 cm thinly septated cyst is seen within the left lobe of the liver. A similar appearing lesion is seen within this region on the prior study. Within normal limits in parenchymal echogenicity. Portal vein is patent on color Doppler imaging with normal direction of blood flow towards the liver. Other: None. IMPRESSION: 1. Cholelithiasis. 2.  Predominantly stable, complex hepatic cyst within the left lobe. Electronically Signed   By:  Aram Candela M.D.   On: 08/31/2022 00:19   CT CHEST WO CONTRAST  Result Date: 08/23/2022 CLINICAL DATA:  Known or suspected pleural effusion. Non-small cell lung cancer patient undergoing treatment. EXAM: CT CHEST WITHOUT CONTRAST TECHNIQUE: Multidetector CT imaging of the chest was performed following the standard protocol without IV contrast. RADIATION DOSE REDUCTION: This exam was performed according to the departmental dose-optimization program which includes automated exposure control, adjustment of the mA and/or kV according to patient size and/or use of iterative reconstruction technique. COMPARISON:  PET-CT 07/25/2022, chest CT with contrast 06/17/2022. FINDINGS: Cardiovascular: There is a right chest MediPort with access needle in place, IJ approach catheter again terminates just above the superior cavoatrial junction. There is mild cardiomegaly with CABG changes chronically seen, heavy native three-vessel coronary plaques and atherosclerosis in the aorta and great vessels without aneurysm. Pulmonary arteries and veins are normal caliber. Mediastinum/Nodes: Chronic left-sided volume loss and mediastinal shift. There is a mildly enlarged azygoesophageal recess lymph node measuring 1.6 x 1.2 cm, previously 1.6 x 0.9 cm. Shotty subcentimeter in short axis prevascular nodes are very slightly more prominent than previously, for example a lymph node anterior to the brachiocephalic arch to the right is 7.3 mm short axis, previously 5 mm. There is a lymph node in between the SVC and innominate artery origin which is 0.8 cm in short axis today and was previously 5 mm short axis. There is no appreciable bulky or encasing adenopathy although assessment of the hilar structures is limited without contrast. There is no axillary or supraclavicular adenopathy. Unremarkable thoracic esophagus. Unremarkable thyroid gland.  Lungs/Pleura: There is centrilobular emphysema. As above there is left-sided volume loss possibly postsurgical. Extensive consolidation continues to be seen in left mid to lower lung field and moderate loculated pleural effusion was apical, anterior and basilar components is not significantly changed. Scar-like opacities are again shown in the left upper lung field. There is a 1.6 cm right upper lobe solid nodule which was previously 1 cm now demonstrating partial cavitation. There are additional scattered new nodules, for example in the anterior right upper lobe base there are 3 new nodules measuring 0.9 cm on 4:58, 5 mm on 4:66, and 4 mm on 4:52. There is a new right middle lobe nodule measuring 6 mm on 4:74 with new right lower lobe nodules largest is 1.5 cm on 4:91, another in the anterior basal segment measures 1 cm on 4:88 and there are small subcentimeter right lower lobe nodules on 4:80 on 4:104. Findings are consistent with progressive metastatic disease. Background subpleural chronic reticulation is noted. No right pleural effusion. Upper Abdomen: Heterogeneous renal masses are larger than last month's PET-CT. Anterior interpolar right renal mass measures 4.2 cm on 2:61, previously 3.1 cm. Posterior left mid pole mass is 5.5 cm on 3:63, incompletely seen but previously no more than 3.1 cm. No liver mass is seen without contrast. There is cholelithiasis without wall thickening. Musculoskeletal: No destructive bone lesions. Osteopenia and degenerative change of the spine. Chronic healed median sternotomy. No appreciable chest wall mass. IMPRESSION: 1. Multiple new right-sided lung nodules consistent with progressive metastatic disease. 2. Chronic left-sided volume loss and consolidation in the left mid to lower lung field with moderate loculated left pleural effusion, not significantly changed. 3. Slight interval enlargement of azygoesophageal recess lymph node and small prevascular lymph nodes. 4. Enlarging  bilateral renal masses. 5. Aortic and coronary artery atherosclerosis. 6. Emphysema. 7. Cholelithiasis. Aortic Atherosclerosis (ICD10-I70.0) and Emphysema (ICD10-J43.9). Electronically Signed  By: Almira Bar M.D.   On: 09/03/2022 23:59   DG Chest 2 View  Result Date: 09/15/2022 CLINICAL DATA:  Hypoxia EXAM: CHEST - 2 VIEW COMPARISON:  02/20/2022, PET CT 02/23/2022 FINDINGS: Right-sided central venous port tip over the cavoatrial region. Post sternotomy changes. Progressive airspace disease and pleural fluid on the left with essentially complete opacification of left thorax. Emphysema. No acute airspace disease on the right. IMPRESSION: Progressive airspace disease and pleural fluid on the left with essentially complete opacification of left thorax. Emphysema. Electronically Signed   By: Jasmine Pang M.D.   On: 08/25/2022 22:14   CT ABDOMEN PELVIS WO CONTRAST  Result Date: 08/20/2022 CLINICAL DATA:  Diffuse abdominal pain. Patient with history of lung cancer. EXAM: CT ABDOMEN AND PELVIS WITHOUT CONTRAST TECHNIQUE: Multidetector CT imaging of the abdomen and pelvis was performed following the standard protocol without IV contrast. RADIATION DOSE REDUCTION: This exam was performed according to the departmental dose-optimization program which includes automated exposure control, adjustment of the mA and/or kV according to patient size and/or use of iterative reconstruction technique. COMPARISON:  PET CT 07/25/2022, chest CT 06/17/2022 abdominal CT 04/25/2022 FINDINGS: Lower chest: Chronic left pleural effusion and basilar opacification. Multiple pulmonary nodules in the right lung base. Largest measures 14 mm series 5, image 20. 9 mm right lower lobe nodule, series 5, image 12. 6 mm pleural based right lower lobe nodule series 5, image 32. These nodules appear increased from prior exams. Baseline fibrotic changes. Hepatobiliary: The low-density liver lesions on prior contrast-enhanced exam are not  well-defined in the absence of IV contrast. Unremarkable gallbladder. No biliary dilatation. Pancreas: No ductal dilatation or inflammation. Spleen: Normal in size without focal abnormality. Adrenals/Urinary Tract: No adrenal nodule. Heterogeneous hypodense mass in the medial left kidney measures approximately 5.1 cm, FDG avid on prior PET. There is a 4.2 cm right renal mass in the anterior mid kidney that is increased in size from prior abdominal CT, series 2, image 31. Bilateral perinephric fat stranding. No hydronephrosis. Calcifications at the renal hila felt to be vascular. Unremarkable urinary bladder. Stomach/Bowel: Detailed bowel assessment is limited in the absence of contrast. The stomach is partially distended, normal for degree of distension. There is no bowel obstruction or inflammation. Normal appendix visualized small to moderate colonic stool burden. Mild sigmoid diverticulosis without diverticulitis. Vascular/Lymphatic: Aortic atherosclerosis. Increasing left retroperitoneal nodal tissue it is now contiguous with the left psoas muscle. This is poorly defined in the absence of IV contrast, however measures approximately 3.5 x 2.6 cm series 2, image 39. Additional left retroperitoneal nodes at the level of the left kidney measuring 11 and 12 mm. Reproductive: Soft tissue thickening in the left vaginal apex series 2, image 79 corresponding to FDG avid lesion on prior PET. This is increased, approximately 3.4 cm series 2, image 79, previously 2.5 cm. Other: Increasing bilateral perinephric stranding. No ascites. No abdominal wall hernia. Musculoskeletal: No suspicious bone lesion. No acute osseous findings. IMPRESSION: 1. Increasing perinephric edema is nonspecific. This may be progressing chronic change or infection. 2. Progressive metastatic disease with increasing left retroperitoneal adenopathy and enlarging right lower lobe pulmonary nodules. 3. Increasing size of bilateral renal masses, favored to  be metastatic rather than primary renal malignancy. 4. Increasing soft tissue associated with the left vaginal apex, likely neoplastic. Aortic Atherosclerosis (ICD10-I70.0). Electronically Signed   By: Narda Rutherford M.D.   On: 08/30/2022 19:48     LOS: 1 day   Lanae Boast, MD [facility]  09/14/2022, 10:23 AM

## 2022-08-27 NOTE — Progress Notes (Signed)
   09/12/2022 0212  BiPAP/CPAP/SIPAP  BiPAP/CPAP/SIPAP Pt Type Adult  BiPAP/CPAP/SIPAP V60  Reason BIPAP/CPAP not in use Other(comment)   RT made aware of BiPAP PRN order placed by ED/RN after VBG(Venous Blood Gas)results obtained from order placed earlier in shift on 08/25/2012, RT reviewed results and spoke with  NP and MD after pt. seen in ED along with RN and was currently on 3 lpm n/c, alert/orientated upon my arrival stating was not able to tolerate mask on her face from past attempt.PRN order remains, ED/staff to monitor.

## 2022-08-27 NOTE — Assessment & Plan Note (Signed)
Will administer albumin to help with third spacing and AKI

## 2022-08-27 NOTE — Hospital Course (Addendum)
77 y.o. female with medical history significant of lung cancer, CAD status post CABG, COPD on 6l [place], HLD, GERD, hypothyroidism, hypertension, history of tobacco abuse who was referred to pulmonology for bronchoscopy, was seen by Dr. Diana Eves and had preliminary test done that showed significant abnormalities with elevated LFTs, renal failure and sent to the ED for further evaluation and to consider bronchoscopy.  In the ED: Afebrile BP stable in 110, not tachycardic or tachypneic Labs repeat shows potassium down from 5.2-4.7, creatinine 6.17 from 6.611 2.0, CK1 950 LDL 348 iron low at 18 saturation 7% lactic acid normal, leukocytosis 18.3 hemoglobin 7.3 g platelet 424, TSH 9.1 INR 1.3 UA WBC 0-5 large LE .  VBG showed> acidosis with pH 7.18 pCO2 61 pO2 55 CXR>progressive airspace disease and pleural fluid on the left with essentially complete opacification of the left thorax, emphysema CT abdomen/pelvis>: Increasing perinephric edema is nonspecific. This may be progressing chronic change or infection. Progressive metastatic disease with increasing left retroperitoneal adenopathy and enlarging right lower lobe pulmonary nodules. Increasing size of bilateral renal masses, favored to be metastatic rather than primary renal malignancy.Increasing soft tissue associated with the left vaginal apex, likely neoplastic. CT chest> Multiple new right-sided lung nodules consistent with progressive metastatic disease. Chronic left-sided volume loss and consolidation in the left mid to lower lung field with moderate loculated left pleural effusion, not significantly changed.Slight interval enlargement of azygoesophageal recess lymph node and small prevascular lymph nodes.Enlarging bilateral renal masses.Aortic and coronary artery atherosclerosis. Emphysema. Cholelithiasis. RUQ [place] no cholecystitis  Nephrology, urology, PCCM was consulted and admitted for further management.

## 2022-08-27 NOTE — Consult Note (Signed)
Consultation Note Date: 09/05/2022   Patient Name: [name]  DOB: 05-Aug-1945  MRN: [id number]  Age / Sex: 77 y.o., female  PCP: Sandre Kitty, MD Referring Physician: Lanae Boast, MD  Reason for Consultation: Establishing goals of care  HPI/Patient Profile: 77 y.o. female  admitted on 09/05/2022   77 y.o. female with medical history significant of lung cancer, CAD status post CABG, COPD on 6l [place], HLD, GERD, hypothyroidism, hypertension, history of tobacco abuse who was referred to pulmonology for bronchoscopy, was seen by Dr. Diana Eves and had preliminary test done that showed significant abnormalities with elevated LFTs, renal failure and sent to the ED for further evaluation.  Clinical Assessment and Goals of Care: Patient currently in the emergency department, has been seen and evaluated by pulmonary as well as renal colleagues.  Admitted to hospital medicine.  Patient has acute kidney injury, acute on chronic hypoxic and hypercapnic respiratory failure, life limiting illness of metastatic lung cancer mass of left kidney like metastatic disease, pleural effusion likely malignant in etiology.  Additionally also with suspected urinary tract infection is suspected postobstructive pneumonia in the setting of metastatic cancer. Palliative medicine is specialized medical care for people living with serious illness. It focuses on providing relief from the symptoms and stress of a serious illness. The goal is to improve quality of life for both the patient and the family. Goals of care: Broad aims of medical therapy in relation to the patient's values and preferences. Our aim is to provide medical care aimed at enabling patients to achieve the goals that matter most to them, given the circumstances of their particular medical situation and their constraints.  Patient sitting in the stretcher in the emergency room.   Discussed with patient about her current condition and overall goals of care.  She is aware of the serious nature of her illness.  I spent some time with her today, the time of initial palliative consultation describing to her about hospice philosophy of care and also about what home with hospice care looks like.  She is in agreement to proceed.  NEXT OF KIN  Lives at home with spouse in [place].   SUMMARY OF RECOMMENDATIONS    Agree with DNR Recommend home with hospice, will request TOC input Continue current hospitalization until home hospice arrangements are made.   Code Status/Advance Care Planning: DNR   Symptom Management:     Palliative Prophylaxis:  Delirium Protocol    Psycho-social/Spiritual:  Desire for further Chaplaincy support:yes Additional Recommendations: Education on Hospice  Prognosis:  < 6 months  Discharge Planning: Home with Hospice      Primary Diagnoses: Present on Admission:  AKI (acute kidney injury) (HCC)  Paroxysmal atrial fibrillation with RVR (HCC)  HTN (hypertension)  CAD (coronary artery disease)  Anemia  Transaminitis  Hypothyroidism  Acute on chronic respiratory failure with hypoxia and hypercapnia (HCC)  Mixed hyperlipidemia  Pneumonia  Pleural effusion  Mass of left kidney  Hyperphosphatemia  Hypoalbuminemia   I have reviewed the medical  record, interviewed the patient and family, and examined the patient. The following aspects are pertinent.  Past Medical History:  Diagnosis Date   CAD (coronary artery disease)    Cancer (HCC)    COPD (chronic obstructive pulmonary disease) (HCC)    per DM note, pt denies   Dyslipidemia    GERD (gastroesophageal reflux disease)    History of radiation therapy    Left lung- 01/16/21-01/30/21- Dr. Antony Blackbird   History of radiation therapy    Left lung- 08/02/21-09/18/21- Dr. Antony Blackbird   HTN (hypertension)    Hypothyroidism    Myocardial infarct [facility])    Non-small cell  lung cancer (HCC) 08/19/2010   Stage IA, status post left upper lobectomy July 2012   Tobacco abuse    Social History   Socioeconomic History   Marital status: Married    Spouse name: Not on file   Number of children: Not on file   Years of education: Not on file   Highest education level: Not on file  Occupational History   Not on file  Tobacco Use   Smoking status: Former    Packs/day: 1.00    Years: 58.00    Additional pack years: 0.00    Total pack years: 58.00    Types: Cigarettes    Quit date: 07/19/2021    Years since quitting: 1.1   Smokeless tobacco: Former   Tobacco comments:    Former smoker 02/19/22  Vaping Use   Vaping Use: Never used  Substance and Sexual Activity   Alcohol use: No    Alcohol/week: 0.0 standard drinks of alcohol   Drug use: No   Sexual activity: Never  Other Topics Concern   Not on file  Social History Narrative   Not on file   Social Determinants of Health   Financial Resource Strain: Low Risk  (02/12/2022)   Overall Financial Resource Strain (CARDIA)    Difficulty of Paying Living Expenses: Not very hard  Food Insecurity: No Food Insecurity (01/28/2022)   Hunger Vital Sign    Worried About Running Out of Food in the Last Year: Never true    Ran Out of Food in the Last Year: Never true  Transportation Needs: No Transportation Needs (02/12/2022)   PRAPARE - Administrator, Civil Service (Medical): No    Lack of Transportation (Non-Medical): No  Physical Activity: Inactive (01/28/2022)   Exercise Vital Sign    Days of Exercise per Week: 0 days    Minutes of Exercise per Session: 0 min  Stress: No Stress Concern Present (01/28/2022)   [employer] of Occupational Health - Occupational Stress Questionnaire    Feeling of Stress : Not at all  Social Connections: Socially Isolated (01/28/2022)   Social Connection and Isolation Panel [NHANES]    Frequency of Communication with Friends and Family: Once a week     Frequency of Social Gatherings with Friends and Family: Once a week    Attends Religious Services: Never    Database administrator or Organizations: No    Attends Engineer, structural: Never    Marital Status: Married   Family History  Problem Relation Age of Onset   Hypothyroidism Father    Heart attack Father    Aplastic anemia Maternal Grandfather    Stroke Paternal Grandfather    Scheduled Meds:  arformoterol  15 mcg Nebulization Daily   budesonide  0.5 mg Nebulization BID   guaiFENesin  600 mg Oral BID  levothyroxine  75 mcg Oral Q0600   Continuous Infusions:  piperacillin-tazobactam (ZOSYN)  IV Stopped (08/21/2022 1540)   PRN Meds:.acetaminophen **OR** acetaminophen, albuterol, HYDROcodone-acetaminophen, ondansetron **OR** ondansetron (ZOFRAN) IV Medications Prior to Admission:  Prior to Admission medications   Medication Sig Start Date End Date Taking? Authorizing Provider  acetaminophen (TYLENOL) 500 MG tablet Take 500 mg by mouth every 6 (six) hours as needed for headache or fever.   Yes [provider]  arformoterol (BROVANA) 15 MCG/2ML NEBU Take 2 mLs (15 mcg total) by nebulization 2 (two) times daily. Patient taking differently: Take 15 mcg by nebulization daily. 03/13/22  Yes Nyoka Cowden, MD  aspirin 81 MG tablet Take 81 mg by mouth at bedtime.   Yes [provider]  budesonide (PULMICORT) 0.5 MG/2ML nebulizer solution Take 2 mLs (0.5 mg total) by nebulization 2 (two) times daily. 05/24/22  Yes Nyoka Cowden, MD  cephALEXin (KEFLEX) 500 MG capsule Take 500 mg by mouth 2 (two) times daily. 08/24/22  Yes [provider]  Coenzyme Q10 (CO Q 10 PO) Take 1 capsule by mouth daily.   Yes [provider]  furosemide (LASIX) 20 MG tablet Take 1 tablet (20 mg total) by mouth daily. 07/29/22  Yes Boscia, Kathlynn Grate, NP  levothyroxine (SYNTHROID) 25 MCG tablet Take 0.5 tablets (12.5 mcg total) by mouth daily. In addition to the 50 mcg.  07/29/22  Yes Boscia, Kathlynn Grate, NP  levothyroxine (SYNTHROID) 50 MCG tablet TAKE 1 TABLET BY MOUTH DAILY ALONG WITH 12.5 MCG EXCEPT ON SUNDAY TAKE 50 MCG Patient taking differently: Take 50 mcg by mouth as directed. 05/01/22  Yes [name], NP  lidocaine-prilocaine (EMLA) cream Apply 1 Application topically as needed. Patient taking differently: Apply 1 Application topically as needed (pain). 08/06/21  Yes Heilingoetter, Cassandra L, PA-C  metoprolol succinate (TOPROL-XL) 100 MG 24 hr tablet TAKE 1 TABLET BY MOUTH DAILY. TAKE WITH OR IMMEDIATELY FOLLOWING A MEAL. Patient taking differently: Take 50 mg by mouth daily. TAKE WITH OR IMMEDIATELY FOLLOWING A MEAL. 08/09/22  Yes Boscia, Heather E, NP  naproxen sodium (ALEVE) 220 MG tablet Take 220 mg by mouth 2 (two) times daily as needed (pain).   Yes [provider]  Omega-3 Fatty Acids (OMEGA-3 PO) Take 1,000 mg by mouth in the morning and at bedtime.   Yes [provider]  potassium chloride (KLOR-CON M10) 10 MEQ tablet Take 1 tablet (10 mEq total) by mouth daily. Patient taking differently: Take 10 mEq by mouth every Monday, Wednesday, and Friday. 07/29/22  Yes [name], NP  revefenacin (YUPELRI) 175 MCG/3ML nebulizer solution INHALE VIA NEBULIZER DAILY 08/07/22  Yes Nyoka Cowden, MD  rosuvastatin (CRESTOR) 40 MG tablet Take 1 tablet (40 mg total) by mouth daily. 09/27/21  Yes [name], NP   Allergies  Allergen Reactions   Amiodarone Shortness Of Breath    Contraindicated for patients on chemotherapy or immunotherapy    Codeine Other (See Comments)    Unknown reaction Patient avoids this medication   Review of Systems +weakness  Physical Exam Appears with generalized weakness Appears chronically ill Is on O2 [place] Awakens easily, however, appears fatigued Has some LE edema Abdomen not tender  Vital Signs: BP (!) 95/55   Pulse 75   Temp 97.8 F (36.6 C) (Oral)   Resp (!) 21   Wt 96 kg    SpO2 93%   BMI 36.33 kg/m  Pain Scale: 0-10   Pain Score:  Asleep   SpO2: SpO2: 93 % O2 Device:SpO2: 93 % O2 Flow Rate: .O2 Flow Rate (L/min): 4 L/min  IO: Intake/output summary:  Intake/Output Summary (Last 24 hours) at 08/22/2022 1650 Last data filed at 09/10/2022 0915 Gross per 24 hour  Intake 315 ml  Output --  Net 315 ml    LBM: Last BM Date : 09/09/2022 Baseline Weight: Weight: 96 kg Most recent weight: Weight: 96 kg     Palliative Assessment/Data:   PPS 40%  Time In:  1540 Time Out:  1640 Time Total:  60  Greater than 50%  of this time was spent counseling and coordinating care related to the above assessment and plan.  Signed by: Rosalin Hawking, MD   Please contact Palliative Medicine Team phone at [phone] for questions and concerns.  For individual provider: See Loretha Stapler

## 2022-08-27 NOTE — Progress Notes (Signed)
[facility] Progress Note  Subjective: pt seen in ED room, no c/o's at this time, creat 6.04   Exam: General: obese, no acute distress HEENT: anicteric sclera, oropharynx clear without  +JVD CV: RRR no rub Lungs: diminished air entry bibasilar, normal work of breathing Abd: soft, non-tender, non-distended Skin: no visible lesions or rashes Psych: alert, engaged, appropriate mood and affect Ext: 1-2+ bilat pretib pitting edema  Neuro: normal speech, no gross focal deficits, tremulous but no asterixis   AKI -b/l Cr WNL 1-2 months ago -AKI likely related to ATN (+granular casts) from recent UTI/pyelo and renal involvement from metastatic disease.   -possible left sided obstruction. Urology to see patient.  -trial of fluids: sp LR 100cc/hr x 24 hrs. Will avoid further IVF"s since +JVD and significant bilat LE edema - renal function is very poor and given the vol overload we have little extra we can do to try and improve function. Pt is tolerating AKI pretty well at this time. Pt is a poor candidate for dialysis given significant comorbidities. Have d/w patient, pmd. If renal function worsens would recommend transition to palliative/ hospice care.  - CCM and oncology consulting as well  UTI, leukocytosis -abx per primary service   Hyperkalemia -improved, monitor for now, if K rising then recommend lokelma   Non small cell lung ca -follows with Dr. Arbutus Ped.    Chronic respiratory failure, h/o possible pneumonitis, COPD -on baseline 4L [place] at home   HTN -BP on the softer side, sp IVF as above. Monitor for now   Anemia -transfuse PRN for Hgb <7. FOBT pending. Avoid ESAs given concern for metastatic disease   Elevated LFTs -trend LFTs, work up per primary   Edema -watch carefully with fluids, possibly related to hypoalbuminemia   Hypoalbuminemia -push protein    Vinson Moselle MD  [facility] 08/26/2022, 2:53 PM  Recent Labs  Lab 09/03/2022 1849 09/09/2022 2121  09/04/2022 0619 09/17/2022 0930  HGB 7.3*   < > 8.0* 9.3*  ALBUMIN 2.0*  --  2.2*  --   CALCIUM 8.0*  --  8.2*  --   PHOS 7.8*  --  9.8*  --   CREATININE 6.17*  --  6.04*  --   K 4.7  --  5.3*  --    < > = values in this interval not displayed.   Recent Labs  Lab 09/03/2022 1849  IRON 18*  TIBC 259  FERRITIN 402*   Inpatient medications:  arformoterol  15 mcg Nebulization Daily   budesonide  0.5 mg Nebulization BID   guaiFENesin  600 mg Oral BID   levothyroxine  75 mcg Oral Q0600    piperacillin-tazobactam (ZOSYN)  IV Stopped (09/14/2022 1006)   acetaminophen **OR** acetaminophen, albuterol, HYDROcodone-acetaminophen, ondansetron **OR** ondansetron (ZOFRAN) IV

## 2022-08-27 NOTE — Anesthesia Preprocedure Evaluation (Deleted)
Anesthesia Evaluation  Patient identified by MRN, date of birth, ID band Patient awake    Reviewed: Allergy & Precautions, H&P , NPO status , Patient's Chart, lab work & pertinent test results  Airway Mallampati: II  TM Distance: >3 FB Neck ROM: Full    Dental no notable dental hx.    Pulmonary COPD, Patient abstained from smoking., former smoker   Pulmonary exam normal breath sounds clear to auscultation       Cardiovascular hypertension, + CAD, + Past MI and + CABG  Normal cardiovascular exam Rhythm:Regular Rate:Normal  1. Difficult study due to poor visualization of LV endocardium.   2. Left ventricular ejection fraction, by estimation, is 55 to 60%. The  left ventricle has normal function. Left ventricular endocardial border  not optimally defined to evaluate regional wall motion. Left ventricular  diastolic parameters are consistent  with Grade I diastolic dysfunction (impaired relaxation). The average left  ventricular global longitudinal strain is -18.1 %. The global longitudinal  strain is normal.   3. Right ventricular systolic function is mildly reduced. The right  ventricular size is normal. Tricuspid regurgitation signal is inadequate  for assessing PA pressure.   4. The mitral valve is grossly normal. Trivial mitral valve  regurgitation.   5. The aortic valve was not well visualized. Aortic valve regurgitation  is not visualized. No aortic stenosis is present.   6. The inferior vena cava is normal in size with greater than 50%  respiratory variability, suggesting right atrial pressure of 3 mmHg.   Comparison(s): Compared to prior TTE in 10/2020, the EF appears slightly  improved from 50-55% to 55%. Otherwise, there is no significant change.     Neuro/Psych negative neurological ROS  negative psych ROS   GI/Hepatic negative GI ROS, Neg liver ROS,,,  Endo/Other  negative endocrine ROS    Renal/GU negative  Renal ROS  negative genitourinary   Musculoskeletal negative musculoskeletal ROS (+)    Abdominal   Peds negative pediatric ROS (+)  Hematology negative hematology ROS (+)   Anesthesia Other Findings   Reproductive/Obstetrics negative OB ROS                             Anesthesia Physical Anesthesia Plan  ASA: 3  Anesthesia Plan: General   Post-op Pain Management: Minimal or no pain anticipated   Induction: Intravenous  PONV Risk Score and Plan: 3 and Ondansetron, Dexamethasone and Treatment may vary due to age or medical condition  Airway Management Planned: Oral ETT  Additional Equipment:   Intra-op Plan:   Post-operative Plan: Extubation in OR  Informed Consent: I have reviewed the patients History and Physical, chart, labs and discussed the procedure including the risks, benefits and alternatives for the proposed anesthesia with the patient or authorized representative who has indicated his/her understanding and acceptance.     Dental advisory given  Plan Discussed with: CRNA and Surgeon  Anesthesia Plan Comments:        Anesthesia Quick Evaluation

## 2022-08-28 DIAGNOSIS — N179 Acute kidney failure, unspecified: Secondary | ICD-10-CM | POA: Diagnosis not present

## 2022-08-28 LAB — CBC
HCT: 31.1 % — ABNORMAL LOW (ref 36.0–46.0)
HCT: 31.8 % — ABNORMAL LOW (ref 36.0–46.0)
HCT: 31 % — ABNORMAL LOW (ref 36.0–46.0)
Hemoglobin: 9.2 g/dL — ABNORMAL LOW (ref 12.0–15.0)
Hemoglobin: 9.2 g/dL — ABNORMAL LOW (ref 12.0–15.0)
Hemoglobin: 9.1 g/dL — ABNORMAL LOW (ref 12.0–15.0)
MCH: 25.4 pg — ABNORMAL LOW (ref 26.0–34.0)
MCH: 24.9 pg — ABNORMAL LOW (ref 26.0–34.0)
MCH: 25.1 pg — ABNORMAL LOW (ref 26.0–34.0)
MCHC: 29.6 g/dL — ABNORMAL LOW (ref 30.0–36.0)
MCHC: 28.9 g/dL — ABNORMAL LOW (ref 30.0–36.0)
MCHC: 29.4 g/dL — ABNORMAL LOW (ref 30.0–36.0)
MCV: 85.9 fL (ref 80.0–100.0)
MCV: 86.2 fL (ref 80.0–100.0)
MCV: 85.6 fL (ref 80.0–100.0)
Platelets: 386 10*3/uL (ref 150–400)
Platelets: 400 10*3/uL (ref 150–400)
Platelets: 398 10*3/uL (ref 150–400)
RBC: 3.62 MIL/uL — ABNORMAL LOW (ref 3.87–5.11)
RBC: 3.69 MIL/uL — ABNORMAL LOW (ref 3.87–5.11)
RBC: 3.62 MIL/uL — ABNORMAL LOW (ref 3.87–5.11)
RDW: 19.2 % — ABNORMAL HIGH (ref 11.5–15.5)
RDW: 19.4 % — ABNORMAL HIGH (ref 11.5–15.5)
RDW: 19.5 % — ABNORMAL HIGH (ref 11.5–15.5)
WBC: 22.6 10*3/uL — ABNORMAL HIGH (ref 4.0–10.5)
WBC: 24.4 10*3/uL — ABNORMAL HIGH (ref 4.0–10.5)
WBC: 26.1 10*3/uL — ABNORMAL HIGH (ref 4.0–10.5)
nRBC: 0 % (ref 0.0–0.2)
nRBC: 0 % (ref 0.0–0.2)
nRBC: 0 % (ref 0.0–0.2)

## 2022-08-28 LAB — BPAM RBC
Blood Product Expiration Date: 202408132359
ISSUE DATE / TIME: 202407090213
Unit Type and Rh: 9500

## 2022-08-28 LAB — TYPE AND SCREEN
Antibody Screen: NEGATIVE
Unit division: 0

## 2022-08-28 LAB — CK
Total CK: 836 U/L — ABNORMAL HIGH (ref 38–234)
Total CK: 762 U/L — ABNORMAL HIGH (ref 38–234)

## 2022-08-28 MED ORDER — MORPHINE SULFATE (PF) 2 MG/ML IV SOLN
1.0000 mg | INTRAVENOUS | Status: DC | PRN
  Administered 2022-08-28 – 2022-08-29 (×2): 1 mg via INTRAVENOUS
  Filled 2022-08-28 (×2): qty 1

## 2022-08-28 MED ORDER — LORAZEPAM 2 MG/ML IJ SOLN
0.5000 mg | Freq: Four times a day (QID) | INTRAMUSCULAR | Status: DC | PRN
  Administered 2022-08-28: 0.5 mg via INTRAVENOUS
  Filled 2022-08-28: qty 1

## 2022-08-28 MED ORDER — GLYCOPYRROLATE 1 MG PO TABS: 1.0000 mg | ORAL_TABLET | ORAL | Status: DC | PRN

## 2022-08-28 MED ORDER — GLYCOPYRROLATE 0.2 MG/ML IJ SOLN: 0.2000 mg | INTRAMUSCULAR | Status: DC | PRN

## 2022-08-28 MED ORDER — LORAZEPAM 2 MG/ML IJ SOLN
1.0000 mg | INTRAMUSCULAR | Status: DC | PRN
  Administered 2022-08-28 (×2): 1 mg via INTRAVENOUS
  Filled 2022-08-28 (×2): qty 1

## 2022-08-28 MED ORDER — GLYCOPYRROLATE 0.2 MG/ML IJ SOLN
0.2000 mg | INTRAMUSCULAR | Status: DC | PRN
  Administered 2022-08-28: 0.2 mg via INTRAVENOUS
  Filled 2022-08-28 (×2): qty 1

## 2022-08-28 MED ORDER — LEVOTHYROXINE SODIUM 25 MCG PO TABS: 12.5000 ug | ORAL_TABLET | Freq: Every day | ORAL | Status: DC

## 2022-08-28 NOTE — Progress Notes (Signed)
OT Cancellation Note  Patient Details Name: [name] MRN: [id number] DOB: Jun 25, 1945   Cancelled Treatment:    Reason Eval/Treat Not Completed: Other (comment). Pt and her family have decided on comfort care. OT will sign off. Thank you.   Reuben Likes, OTR/L , 5:40 PM

## 2022-08-28 NOTE — Progress Notes (Signed)
Nutrition Brief Note  RD received consult for nutritional assessment. Chart reviewed. Pt now transitioning to comfort care.  No further nutrition interventions planned at this time.   Tilda Franco, MS, RD, LDN Inpatient Clinical Dietitian Contact information available via Amion

## 2022-08-28 NOTE — Progress Notes (Signed)
Civil engineer, contracting [facility]) Hospital Liaison Note  Referral received for patient/family interest in [facility].   Unfortunately, [facility] is unable to offer a bed today.   [facility] liaison will evaluate tomorrow.   Please call with any questions or concerns. Thank you  Dionicio Stall, [facility] Liasion [phone]

## 2022-08-28 NOTE — Progress Notes (Signed)
PT Cancellation Note  Patient Details Name: [name] MRN: [id number] DOB: May 16, 1945   Cancelled Treatment:    Reason Eval/Treat Not Completed: PT screened, no needs identified, will sign off. Per chart review, orders in for comfort care. Palliative following. TOC following for home vs residential hospice. Will sign off at this time.    Faye Ramsay, PT Acute Rehabilitation  Office: [phone]

## 2022-08-28 NOTE — Progress Notes (Signed)
Daily Progress Note   Patient Name: [name]       Date:  DOB: [birth year]  Age: 77 y.o. MRN#: [id number] Attending Physician: Lorin Glass, MD Primary Care Physician: Sandre Kitty, MD Admit Date: 08/19/2022  Reason for Consultation/Follow-up: Establishing goals of care  Subjective: More confused today.  Yelling out the name of her disease to mother, yelling out the name of her oldest son.  Husband at bedside.  Length of Stay: 2  Current Medications: Scheduled Meds:   arformoterol  15 mcg Nebulization Daily   budesonide  0.5 mg Nebulization BID   Chlorhexidine Gluconate Cloth  6 each Topical Daily   guaiFENesin  600 mg Oral BID   [START ON 08/24/2022] levothyroxine  12.5 mcg Oral Q0600   sodium chloride flush  10-40 mL Intracatheter Q12H    Continuous Infusions:  piperacillin-tazobactam (ZOSYN)  IV 2.25 g ( 0238)    PRN Meds: acetaminophen **OR** acetaminophen, albuterol, HYDROcodone-acetaminophen, LORazepam, ondansetron **OR** ondansetron (ZOFRAN) IV, sodium chloride flush  Physical Exam         More confused today Appears restless Does not follow commands Awake but not entirely alert Mildly increased work of breathing Abdomen not distended  Vital Signs: BP (!) 103/58 (BP Location: Right Arm)   Pulse 78   Temp 97.7 F (36.5 C) (Axillary)   Resp (!) 25   Ht 5\' 4"  (1.626 m)   Wt 96 kg   SpO2 96%   BMI 36.33 kg/m  SpO2: SpO2: 96 % O2 Device: O2 Device: Nasal Cannula O2 Flow Rate: O2 Flow Rate (L/min): 5 L/min  Intake/output summary:  Intake/Output Summary (Last 24 hours) at  0929 Last data filed at  0412 Gross per 24 hour  Intake 321.74 ml  Output 200 ml  Net 121.74 ml   LBM: Last BM Date : 08/30/2022 Baseline  Weight: Weight: 96 kg Most recent weight: Weight: 96 kg       Palliative Assessment/Data:      Patient Active Problem List   Diagnosis Date Noted   Hyperphosphatemia 09/04/2022   Hypoalbuminemia 09/02/2022   Anemia 08/30/2022   Transaminitis 09/18/2022   Pneumonia 08/27/2022   Pleural effusion 09/12/2022   Mass of left kidney 05/13/2022   Hypotension 03/20/2022   Drug-induced pneumonitis 02/20/2022   Hypercoagulable state due to paroxysmal  atrial fibrillation (HCC) 02/19/2022   Hx of CABG 01/29/2022   History of CAD (coronary artery disease) 01/29/2022   AKI (acute kidney injury) (HCC) 01/29/2022   Acute on chronic respiratory failure with hypoxia and hypercapnia (HCC) 01/28/2022   Encounter for antineoplastic immunotherapy 11/15/2021   Paroxysmal atrial fibrillation (HCC) 09/01/2021   Hypokalemia 09/01/2021   Port-A-Cath in place 08/20/2021   Encounter for antineoplastic chemotherapy 08/20/2021   Collapse of left lung 08/09/2021   Stage III squamous cell carcinoma of lung, left (HCC) 08/06/2021   Malignant pleural effusion 07/30/2021   Vasomotor rhinitis 03/04/2021   Body mass index (BMI) of 36.0-36.9 in adult 03/04/2021   Lower extremity edema 10/25/2020   Mixed hyperlipidemia 10/25/2020   Impaired fasting glucose 10/25/2020   Vitamin D deficiency 10/25/2020   Leukocytosis 10/25/2020   Cigarette smoker 09/11/2020   Chronic respiratory failure with hypoxia and hypercapnia (HCC) 07/02/2020   COPD GOLD III 07/02/2020   DOE (dyspnea on exertion) 06/29/2020   Hospital discharge follow-up 05/31/2020   Supplemental oxygen dependent 05/31/2020   History of lung cancer 05/31/2020   Chronic congestive heart failure (HCC) 05/31/2020   Paroxysmal atrial fibrillation with RVR (HCC) 05/31/2020   Iron deficiency anemia 05/31/2020   Dehydration 05/31/2020   Acute blood loss anemia 05/16/2020   Acute systolic congestive heart failure (HCC) 05/08/2020   Metabolic encephalopathy  05/05/2020   Influenza A 05/05/2020   Influenza vaccination declined 01/27/2019   Goals of care, counseling/discussion 01/27/2019   Morbidly obese (HCC) 05/15/2017   Glucose intolerance (impaired glucose tolerance) 05/15/2017   Myocardial infarct (HCC) 01/16/2017   Tobacco abuse counseling 01/16/2017   Hypothyroidism 01/16/2017   Family history of leukemia 01/16/2017   Palpitations 07/21/2012   Lung cancer, upper lobe (HCC) 07/09/2011   HTN (hypertension)    CAD (coronary artery disease)    Dyslipidemia    GERD (gastroesophageal reflux disease)     Palliative Care Assessment & Plan   Patient Profile:    Assessment:  Patient has acute kidney injury, acute on chronic hypoxic and hypercapnic respiratory failure, life limiting illness of metastatic lung cancer mass of left kidney like metastatic disease, pleural effusion likely malignant in etiology. Additionally also with suspected urinary tract infection is suspected postobstructive pneumonia in the setting of metastatic cancer.   Recommendations/Plan: Palliative care consulted for ongoing goals of care discussions.  Met with patient at bedside.  Discussed with him about patient's current condition.  Hospice philosophy of care introduced.  Differences between home with hospice versus residential hospice discussed.  Patient's husband wants to discuss with the rest of the family.  Also requesting medical update.  Will request TOC to follow-up later on today as well.  All of the patient's husband's concerns addressed to the best of my ability.    Code Status:    Code Status Orders  (From admission, onward)           Start     Ordered   09/13/2022 2306  Do not attempt resuscitation (DNR)  Continuous       Question Answer Comment  If patient has no pulse and is not breathing Do Not Attempt Resuscitation   If patient has a pulse and/or is breathing: Medical Treatment Goals LIMITED ADDITIONAL INTERVENTIONS: Use medication/IV fluids  and cardiac monitoring as indicated; Do not use intubation or mechanical ventilation (DNI), also provide comfort medications.  Transfer to Progressive/Stepdown as indicated, avoid Intensive Care.   Consent: Discussion documented in EHR or advanced directives reviewed  08/22/2022 2305           Code Status History     Date Active Date Inactive Code Status Order ID Comments User Context   08/31/2022 2134 09/01/2022 2305 Full Code [id number]  Therisa Doyne, MD ED   01/28/2022 2143 02/09/2022 0004 Full Code [id number]  Charlsie Quest, MD ED   08/09/2021 0030 08/11/2021 1822 Full Code [id number]  Hillary Bow, DO ED      Advance Directive Documentation    Flowsheet Row Most Recent Value  Type of Advance Directive Healthcare Power of Attorney  Pre-existing out of facility DNR order (yellow form or pink MOST form) --  "MOST" Form in Place? --       Prognosis:  Guarded, patient with ongoing marked decline, prognosis could be less than 2 weeks.   Discharge Planning: To Be Determined  Care plan was discussed with patient, husband.    Thank you for allowing the Palliative Medicine Team to assist in the care of this patient.  High MDM.      Greater than 50%  of this time was spent counseling and coordinating care related to the above assessment and plan.  Rosalin Hawking, MD  Please contact Palliative Medicine Team phone at [phone] for questions and concerns.

## 2022-08-28 NOTE — TOC Initial Note (Addendum)
Transition of Care [facility]) - Initial/Assessment Note    Patient Details  Name: [name] MRN: [id number] Date of Birth: Dec 27, 1945  [facility] of [facility]) CM/SW Contact:    Howell Rucks, RN Phone Number: , 9:34 AM  Clinical Narrative:  [name] received from Home [facility]osp[facility]. Palliative team had discussion with pt's spouse this morning with recommendations,  spouse to discuss Hospice with his family  home hospice vs inpatient hospice [facility]). Pt's spouse requesting to speak with attending. NCM will follow up later this afternoon per pt's spouse request.    -3:13pm Family has confirmed decision- inpatient hospice at [facility], [place] with [facility] notfied, per [place], no beds available today, family will like to [facility], MD aware. TOC will continue to follow.                      Patient Goals and CMS Choice            Expected Discharge Plan and Services                                              Prior Living Arrangements/Services                       Activities of Daily Living Home Assistive Devices/Equipment: Dan Humphreys (specify type) ADL Screening (condition at time of admission) Patient's cognitive ability adequate to safely complete daily activities?: Yes Is the patient deaf or have difficulty hearing?: No Does the patient have difficulty seeing, even when wearing glasses/contacts?: No Does the patient have difficulty concentrating, remembering, or making decisions?: No Patient able to express need for assistance with ADLs?: Yes Does the patient have difficulty dressing or bathing?: Yes Independently performs ADLs?: No Communication: Independent Dressing (OT): Needs assistance Is this a change from baseline?: Pre-admission baseline Grooming: Needs assistance Is this a change from baseline?: Pre-admission baseline Feeding: Independent Bathing: Needs assistance Is this a change from  baseline?: Pre-admission baseline Toileting: Needs assistance Is this a change from baseline?: Pre-admission baseline In/Out Bed: Needs assistance Is this a change from baseline?: Pre-admission baseline Walks in Home: Needs assistance Is this a change from baseline?: Pre-admission baseline Does the patient have difficulty walking or climbing stairs?: Yes Weakness of Legs: Both Weakness of Arms/Hands: None  Permission Sought/Granted                  Emotional Assessment              Admission diagnosis:  Azotemia [R79.89] Elevated CK [R74.8] AKI (acute kidney injury) (HCC) [N17.9] Low hemoglobin [D64.9] Acute renal failure, unspecified acute renal failure type [name]) [N17.9] Patient Active Problem List   Diagnosis Date Noted   Hyperphosphatemia 09/13/2022   Hypoalbuminemia 08/26/2022   Anemia 09/11/2022   Transaminitis 09/08/2022   Pneumonia 09/10/2022   Pleural effusion 09/08/2022   Mass of left kidney 05/13/2022   Hypotension 03/20/2022   Drug-induced pneumonitis 02/20/2022   Hypercoagulable state due to paroxysmal atrial fibrillation (HCC) 02/19/2022   Hx of CABG 01/29/2022   History of CAD (coronary artery disease) 01/29/2022   AKI (acute kidney injury) (HCC) 01/29/2022   Acute on chronic respiratory failure with hypoxia and hypercapnia (HCC) 01/28/2022   Encounter for antineoplastic immunotherapy 11/15/2021   Paroxysmal atrial fibrillation (HCC) 09/01/2021   Hypokalemia 09/01/2021  Port-A-Cath in place 08/20/2021   Encounter for antineoplastic chemotherapy 08/20/2021   Collapse of left lung 08/09/2021   Stage III squamous cell carcinoma of lung, left (HCC) 08/06/2021   Malignant pleural effusion 07/30/2021   Vasomotor rhinitis 03/04/2021   Body mass index (BMI) of 36.0-36.9 in adult 03/04/2021   Lower extremity edema 10/25/2020   Mixed hyperlipidemia 10/25/2020   Impaired fasting glucose 10/25/2020   Vitamin D deficiency 10/25/2020   Leukocytosis  10/25/2020   Cigarette smoker 09/11/2020   Chronic respiratory failure with hypoxia and hypercapnia (HCC) 07/02/2020   COPD GOLD III 07/02/2020   DOE (dyspnea on exertion) 06/29/2020   Hospital discharge follow-up 05/31/2020   Supplemental oxygen dependent 05/31/2020   History of lung cancer 05/31/2020   Chronic congestive heart failure (HCC) 05/31/2020   Paroxysmal atrial fibrillation with RVR (HCC) 05/31/2020   Iron deficiency anemia 05/31/2020   Dehydration 05/31/2020   Acute blood loss anemia 05/16/2020   Acute systolic congestive heart failure (HCC) 05/08/2020   Metabolic encephalopathy 05/05/2020   Influenza A 05/05/2020   Influenza vaccination declined 01/27/2019   Goals of care, counseling/discussion 01/27/2019   Morbidly obese (HCC) 05/15/2017   Glucose intolerance (impaired glucose tolerance) 05/15/2017   Myocardial infarct (HCC) 01/16/2017   Tobacco abuse counseling 01/16/2017   Hypothyroidism 01/16/2017   Family history of leukemia 01/16/2017   Palpitations 07/21/2012   Lung cancer, upper lobe (HCC) 07/09/2011   HTN (hypertension)    CAD (coronary artery disease)    Dyslipidemia    GERD (gastroesophageal reflux disease)    PCP:  Sandre Kitty, MD Pharmacy:   [facility] [phone] [place] Otto, [place] - [address] [place] [address] Phone: [phone] Fax: [phone]  [id number] - [place] - [address] [place] [address] Phone: [phone] Fax: [phone]     Social Determinants of Health (SDOH) Social History: SDOH Screenings   Food Insecurity: No Food Insecurity ()  Housing: Low Risk  (08/22/2022)  Transportation Needs: No Transportation Needs (09/05/2022)  Utilities: Not At Risk (09/16/2022)  Alcohol Screen: Low Risk  (01/28/2022)  Depression (PHQ2-9): Medium Risk (07/26/2022)  Financial Resource Strain: Low Risk  (02/12/2022)  Physical Activity: Inactive (01/28/2022)  Social  Connections: Socially Isolated (01/28/2022)  Stress: No Stress Concern Present (01/28/2022)  Tobacco Use: Medium Risk ()   SDOH Interventions:     Readmission Risk Interventions    01/30/2022    4:41 PM  Readmission Risk Prevention Plan  Transportation Screening Complete  PCP or Specialist Appt within 3-5 Days Complete  HRI or Home Care Consult Complete  Palliative Care Screening Not Applicable  Medication Review (RN Care Manager) Complete

## 2022-08-28 NOTE — Progress Notes (Signed)
PROGRESS NOTE  ALVENA KIERNAN  DOB: 07-Jul-1945  PCP: Sandre Kitty, MD OZH:[id number]  DOA: 09/08/2022  LOS: 2 days  Hospital Day: 3  Brief narrative: LOUGENIA MORRISSEY is a 77 y.o. female with PMH significant for HTN, HLD, CAD/CABG, chronic smoker, lung cancer, COPD on 6 L, GERD, hypothyroidism. Recently she was seen by pulmonology as an outpatient because of radiographic evidence of disease progression.  She was planned for further workup including bronchoscopy but was noted to have elevated LFTs, renal failure and hence sent to the ED on 7/8 for further evaluation.  Initial labs showed creatinine elevated to 6.17, hemoglobin low at 7.3, VBG with pH low at 7.18, pCO2 elevated to 61 CT chest abdomen pelvis were obtained which showed progressive metastatic disease with increasing left retroperitoneal adenopathy, pleural effusion with with opacification of the left hemithorax. Nephrology, urology, PCCM) acute consultations were obtained Per nephrology, patient is a poor candidate for dialysis. After discussion with palliative care, family made a decision to choose comfort care. Currently on comfort care status  Subjective: Patient was seen and examined this morning.  Patient was somnolent at the time of my evaluation, tried to open eyes and verbal command.  Earlier she was anxious, agitated and required a dose of Ativan. Multiple members including patient's husband was at bedside.  Assessment and plan: Comfort care measures End-of-life care Primarily admitted for AKI in the setting of metastatic cancer.  Deemed not a candidate for dialysis or urologic stenting.  Family chose comfort care Comfort care measures initiated. TOC consulted for residential hospice placement.  Other acute issues Acute kidney injury progressing to ESRD Not a candidate for dialysis  Acute metabolic encephalopathy Primarily due to uremia and hypercapnia.  Ativan as needed for agitation  Acute on chronic  respiratory failure with hypoxia and hypercapnia Metastatic lung cancer Mass of left kidney-likely metastatic disease Pleural effusion-likely malignant: CAD Anemia of chronic disease Hypothyroidism Mixed hyperlipidemi  Goals of care   Code Status: DNR     DVT prophylaxis:  SCDs Start: 09/16/2022 2136   Consultants: Palliative care Family Communication: Multiple family members at bedside  Status: Inpatient Level of care:  Progressive   Patient from: Home Anticipated d/c to: Residential hospice Needs to continue in-hospital care:  Pending residential hospice.    Diet:  Diet Order             Diet renal with fluid restriction Fluid restriction: 1500 mL Fluid; Room service appropriate? Yes; Fluid consistency: Thin  Diet effective now                   Scheduled Meds:  arformoterol  15 mcg Nebulization Daily   budesonide  0.5 mg Nebulization BID   Chlorhexidine Gluconate Cloth  6 each Topical Daily   guaiFENesin  600 mg Oral BID   sodium chloride flush  10-40 mL Intracatheter Q12H    PRN meds: acetaminophen **OR** acetaminophen, albuterol, HYDROcodone-acetaminophen, LORazepam, morphine injection, ondansetron **OR** ondansetron (ZOFRAN) IV, sodium chloride flush   Infusions:    Antimicrobials: Anti-infectives (From admission, onward)    Start     Dose/Rate Route Frequency Ordered Stop   09/06/2022 0000  piperacillin-tazobactam (ZOSYN) IVPB 2.25 g  Status:  Discontinued        2.25 g 100 mL/hr over 30 Minutes Intravenous Every 6 hours 09/13/2022 2307  1325       Nutritional status:  Body mass index is 36.33 kg/m.  Objective: Vitals:    0904  1302  BP: (!) 103/58 (!) 140/61  Pulse: 78 80  Resp: (!) 25   Temp: 97.7 F (36.5 C) 97.8 F (36.6 C)  SpO2: 96% 96%    Intake/Output Summary (Last 24 hours) at  1413 Last data filed at  1354 Gross per 24 hour  Intake 346.74 ml  Output 200 ml  Net  146.74 ml   Filed Weights   09/17/2022 2300 09/18/2022 1726  Weight: 96 kg 96 kg   Weight change: 0 kg Body mass index is 36.33 kg/m.   Physical Exam: General exam: Not in distress.  Somnolent Skin: No rashes, lesions or ulcers. HEENT: Atraumatic, normocephalic, no obvious bleeding Lungs: Diminished air entry in both bases CVS: Regular rate and rhythm, no murmur GI/Abd soft, nontender, nondistended, bowel sound present CNS: Somnolent at the time of my evaluation.  Tried to open eyes on verbal command. Psychiatry: Sad affect Extremities: No pedal edema, no purulence  Data Review: I have personally reviewed the laboratory data and studies available.  F/u labs  Unresulted Labs (From admission, onward)     Start     Ordered   08/27/2022 2304  Expectorated Sputum Assessment w Gram Stain, Rflx to Resp Cult  Once,   R       Question:  Patient immune status  Answer:  Normal   09/02/2022 2304    2304  Legionella Pneumophila Serogp 1 Ur Ag  Once,   R        09/11/2022 2304            Total time spent in review of labs and imaging, patient evaluation, formulation of plan, documentation and communication with family: 45 minutes  Signed, Lorin Glass, MD [facility]

## 2022-08-29 LAB — LEGIONELLA PNEUMOPHILA SEROGP 1 UR AG: L. pneumophila Serogp 1 Ur Ag: NEGATIVE

## 2022-08-30 ENCOUNTER — Ambulatory Visit (HOSPITAL_COMMUNITY): Payer: Medicare HMO

## 2022-08-30 ENCOUNTER — Encounter (HOSPITAL_COMMUNITY): Payer: Self-pay

## 2022-09-04 ENCOUNTER — Ambulatory Visit: Payer: Medicare HMO | Admitting: Acute Care

## 2022-09-19 NOTE — Progress Notes (Signed)
    Patient Name: [name]           DOB: 04/16/1945  MRN: [id number]       OVERNIGHT EVENT    Notified by RN that patient has expired at 0142.  2 RN verified. Patient was comfort care.   Family is at bedside.    Anthoney Harada, DNP, ACNPC- AG [facility]

## 2022-09-19 NOTE — Death Summary Note (Signed)
DEATH SUMMARY   Patient Details  Name: [name] MRN: [id number] DOB: 11-Sep-1945 [name], Megan Caras, MD Admission/Discharge Information   Admit Date:  09/11/2022  Date of Death: Date of Death: Sep 14, 2022  Time of Death: Time of Death: 0142  Length of Stay: 3   Hospital Diagnoses: Principal Problem:   AKI (acute kidney injury) (HCC) Active Problems:   Paroxysmal atrial fibrillation with RVR (HCC)   Chronic congestive heart failure (HCC)   HTN (hypertension)   Acute on chronic respiratory failure with hypoxia and hypercapnia (HCC)   CAD (coronary artery disease)   Hypothyroidism   Mixed hyperlipidemia   Mass of left kidney   Anemia   Transaminitis   Pneumonia   Pleural effusion   Hyperphosphatemia   Hypoalbuminemia   Hospital Course: SHAMAINE CINK is a 77 y.o. female with PMH significant for HTN, HLD, CAD/CABG, chronic smoker, lung cancer, COPD on 6 L, GERD, hypothyroidism. Recently she was seen by pulmonology as an outpatient because of radiographic evidence of disease progression.  She was planned for further workup including bronchoscopy but was noted to have elevated LFTs, renal failure and hence sent to the ED on 09/11/22 for further evaluation.   Initial labs showed creatinine elevated to 6.17, hemoglobin low at 7.3, VBG with pH low at 7.18, pCO2 elevated to 61 CT chest abdomen pelvis were obtained which showed progressive metastatic disease with increasing left retroperitoneal adenopathy, pleural effusion with with opacification of the left hemithorax. Nephrology, urology, PCCM and palliative care consultations were obtained Per nephrology, patient is a poor candidate for dialysis. After discussion with palliative care, family made a decision to choose comfort care.  Comfort care measures were started. Patient was planned for discharge to residential hospice.  While the preparation was in process, patient expired in the hospital on 09/14/2022 at 1:42  AM  Cause of death: End-stage renal disease due to AKI precipitated by progressive metastatic lung cancer       Procedures:   Consultations:   The results of significant diagnostics from this hospitalization (including imaging, microbiology, ancillary and laboratory) are listed below for reference.   Significant Diagnostic Studies: US RENAL  Result Date: 12-Sep-2022 CLINICAL DATA:  Hydronephrosis, history of metastatic lung cancer EXAM: RENAL / URINARY TRACT ULTRASOUND COMPLETE COMPARISON:  Abdominal ultrasound 09/11/2022, CT abdomen and pelvis without contrast 2022/09/11, PET-CT July 25, 2022 FINDINGS: Right Kidney: Renal measurements: 12.2 x 6.3 x 7.4 cm = volume: 297.6 mL. Echogenicity within normal limits. No hydronephrosis. There is a 4.1 x 3.6 x 3.7 cm solid mass within the superior pole of the right kidney, previously demonstrating FDG avidity. Left Kidney: Renal measurements: 11.9 x 7.6 x 7.4 cm = volume: 350.2 mL. Large exophytic solid mass originating from the superior pole, previously demonstrating FDG avidity. However, accurate size measurement is limited due to heterogeneous nature. There is moderate left hydronephrosis. Bladder: Appears normal for degree of bladder distention. Other: None. IMPRESSION: 1. Moderate left hydronephrosis.  No right hydronephrosis. 2. Bilateral renal masses, better characterized on recent CT and PET-CT. These masses demonstrated FDG avidity, concerning for metastatic disease or less likely primary renal malignancy. Electronically Signed   By: Jacob Moores M.D.   On: 09/12/22 10:24   US Abdomen Limited RUQ (LIVER/GB)  Result Date: Sep 12, 2022 CLINICAL DATA:  Elevated liver function test. EXAM: ULTRASOUND ABDOMEN LIMITED RIGHT UPPER QUADRANT COMPARISON:  None Available. FINDINGS: Gallbladder: Numerous shadowing echogenic gallstones are seen within the gallbladder lumen.  The largest measures approximately 1.2 cm. The gallbladder wall measures 2.3 mm in  thickness. No sonographic Murphy sign noted by sonographer. Common bile duct: Diameter: 5.8 mm Liver: 1.4 cm x 1.3 cm x 1.5 cm thinly septated cyst is seen within the left lobe of the liver. A similar appearing lesion is seen within this region on the prior study. Within normal limits in parenchymal echogenicity. Portal vein is patent on color Doppler imaging with normal direction of blood flow towards the liver. Other: None. IMPRESSION: 1. Cholelithiasis. 2. Predominantly stable, complex hepatic cyst within the left lobe. Electronically Signed   By: Aram Candela M.D.   On: 09/09/2022 00:19   CT CHEST WO CONTRAST  Result Date: 09/10/2022 CLINICAL DATA:  Known or suspected pleural effusion. Non-small cell lung cancer patient undergoing treatment. EXAM: CT CHEST WITHOUT CONTRAST TECHNIQUE: Multidetector CT imaging of the chest was performed following the standard protocol without IV contrast. RADIATION DOSE REDUCTION: This exam was performed according to the departmental dose-optimization program which includes automated exposure control, adjustment of the mA and/or kV according to patient size and/or use of iterative reconstruction technique. COMPARISON:  PET-CT 07/25/2022, chest CT with contrast 06/17/2022. FINDINGS: Cardiovascular: There is a right chest MediPort with access needle in place, IJ approach catheter again terminates just above the superior cavoatrial junction. There is mild cardiomegaly with CABG changes chronically seen, heavy native three-vessel coronary plaques and atherosclerosis in the aorta and great vessels without aneurysm. Pulmonary arteries and veins are normal caliber. Mediastinum/Nodes: Chronic left-sided volume loss and mediastinal shift. There is a mildly enlarged azygoesophageal recess lymph node measuring 1.6 x 1.2 cm, previously 1.6 x 0.9 cm. Shotty subcentimeter in short axis prevascular nodes are very slightly more prominent than previously, for example a lymph node anterior  to the brachiocephalic arch to the right is 7.3 mm short axis, previously 5 mm. There is a lymph node in between the SVC and innominate artery origin which is 0.8 cm in short axis today and was previously 5 mm short axis. There is no appreciable bulky or encasing adenopathy although assessment of the hilar structures is limited without contrast. There is no axillary or supraclavicular adenopathy. Unremarkable thoracic esophagus. Unremarkable thyroid gland. Lungs/Pleura: There is centrilobular emphysema. As above there is left-sided volume loss possibly postsurgical. Extensive consolidation continues to be seen in left mid to lower lung field and moderate loculated pleural effusion was apical, anterior and basilar components is not significantly changed. Scar-like opacities are again shown in the left upper lung field. There is a 1.6 cm right upper lobe solid nodule which was previously 1 cm now demonstrating partial cavitation. There are additional scattered new nodules, for example in the anterior right upper lobe base there are 3 new nodules measuring 0.9 cm on 4:58, 5 mm on 4:66, and 4 mm on 4:52. There is a new right middle lobe nodule measuring 6 mm on 4:74 with new right lower lobe nodules largest is 1.5 cm on 4:91, another in the anterior basal segment measures 1 cm on 4:88 and there are small subcentimeter right lower lobe nodules on 4:80 on 4:104. Findings are consistent with progressive metastatic disease. Background subpleural chronic reticulation is noted. No right pleural effusion. Upper Abdomen: Heterogeneous renal masses are larger than last month's PET-CT. Anterior interpolar right renal mass measures 4.2 cm on 2:61, previously 3.1 cm. Posterior left mid pole mass is 5.5 cm on 3:63, incompletely seen but previously no more than 3.1 cm. No liver mass is seen  without contrast. There is cholelithiasis without wall thickening. Musculoskeletal: No destructive bone lesions. Osteopenia and degenerative  change of the spine. Chronic healed median sternotomy. No appreciable chest wall mass. IMPRESSION: 1. Multiple new right-sided lung nodules consistent with progressive metastatic disease. 2. Chronic left-sided volume loss and consolidation in the left mid to lower lung field with moderate loculated left pleural effusion, not significantly changed. 3. Slight interval enlargement of azygoesophageal recess lymph node and small prevascular lymph nodes. 4. Enlarging bilateral renal masses. 5. Aortic and coronary artery atherosclerosis. 6. Emphysema. 7. Cholelithiasis. Aortic Atherosclerosis (ICD10-I70.0) and Emphysema (ICD10-J43.9). Electronically Signed   By: Almira Bar M.D.   On: 08/25/2022 23:59   DG Chest 2 View  Result Date: 08/21/2022 CLINICAL DATA:  Hypoxia EXAM: CHEST - 2 VIEW COMPARISON:  02/20/2022, PET CT 02/23/2022 FINDINGS: Right-sided central venous port tip over the cavoatrial region. Post sternotomy changes. Progressive airspace disease and pleural fluid on the left with essentially complete opacification of left thorax. Emphysema. No acute airspace disease on the right. IMPRESSION: Progressive airspace disease and pleural fluid on the left with essentially complete opacification of left thorax. Emphysema. Electronically Signed   By: Jasmine Pang M.D.   On: 09/11/2022 22:14   CT ABDOMEN PELVIS WO CONTRAST  Result Date: 09/15/2022 CLINICAL DATA:  Diffuse abdominal pain. Patient with history of lung cancer. EXAM: CT ABDOMEN AND PELVIS WITHOUT CONTRAST TECHNIQUE: Multidetector CT imaging of the abdomen and pelvis was performed following the standard protocol without IV contrast. RADIATION DOSE REDUCTION: This exam was performed according to the departmental dose-optimization program which includes automated exposure control, adjustment of the mA and/or kV according to patient size and/or use of iterative reconstruction technique. COMPARISON:  PET CT 07/25/2022, chest CT 06/17/2022 abdominal CT  04/25/2022 FINDINGS: Lower chest: Chronic left pleural effusion and basilar opacification. Multiple pulmonary nodules in the right lung base. Largest measures 14 mm series 5, image 20. 9 mm right lower lobe nodule, series 5, image 12. 6 mm pleural based right lower lobe nodule series 5, image 32. These nodules appear increased from prior exams. Baseline fibrotic changes. Hepatobiliary: The low-density liver lesions on prior contrast-enhanced exam are not well-defined in the absence of IV contrast. Unremarkable gallbladder. No biliary dilatation. Pancreas: No ductal dilatation or inflammation. Spleen: Normal in size without focal abnormality. Adrenals/Urinary Tract: No adrenal nodule. Heterogeneous hypodense mass in the medial left kidney measures approximately 5.1 cm, FDG avid on prior PET. There is a 4.2 cm right renal mass in the anterior mid kidney that is increased in size from prior abdominal CT, series 2, image 31. Bilateral perinephric fat stranding. No hydronephrosis. Calcifications at the renal hila felt to be vascular. Unremarkable urinary bladder. Stomach/Bowel: Detailed bowel assessment is limited in the absence of contrast. The stomach is partially distended, normal for degree of distension. There is no bowel obstruction or inflammation. Normal appendix visualized small to moderate colonic stool burden. Mild sigmoid diverticulosis without diverticulitis. Vascular/Lymphatic: Aortic atherosclerosis. Increasing left retroperitoneal nodal tissue it is now contiguous with the left psoas muscle. This is poorly defined in the absence of IV contrast, however measures approximately 3.5 x 2.6 cm series 2, image 39. Additional left retroperitoneal nodes at the level of the left kidney measuring 11 and 12 mm. Reproductive: Soft tissue thickening in the left vaginal apex series 2, image 79 corresponding to FDG avid lesion on prior PET. This is increased, approximately 3.4 cm series 2, image 79, previously 2.5 cm.  Other: Increasing bilateral perinephric stranding. No  ascites. No abdominal wall hernia. Musculoskeletal: No suspicious bone lesion. No acute osseous findings. IMPRESSION: 1. Increasing perinephric edema is nonspecific. This may be progressing chronic change or infection. 2. Progressive metastatic disease with increasing left retroperitoneal adenopathy and enlarging right lower lobe pulmonary nodules. 3. Increasing size of bilateral renal masses, favored to be metastatic rather than primary renal malignancy. 4. Increasing soft tissue associated with the left vaginal apex, likely neoplastic. Aortic Atherosclerosis (ICD10-I70.0). Electronically Signed   By: Narda Rutherford M.D.   On: 09/15/2022 19:48    Microbiology: Recent Results (from the past 240 hour(s))  MRSA Next Gen by PCR, Nasal     Status: None   Collection Time: 08/30/2022  2:19 AM   Specimen: Anterior Nasal Swab  Result Value Ref Range Status   MRSA by PCR Next Gen NOT DETECTED NOT DETECTED Final    Comment: (NOTE) The GeneXpert MRSA Assay (FDA approved for NASAL specimens only), is one component of a comprehensive MRSA colonization surveillance program. It is not intended to diagnose MRSA infection nor to guide or monitor treatment for MRSA infections. Test performance is not FDA approved in patients less than 65 years old. Performed at [facility], [address], [place], [place] [address]   SARS Coronavirus 2 by RT PCR (hospital order, performed in [facility] hospital lab) *cepheid single result test* Anterior Nasal Swab     Status: None   Collection Time: 09/06/2022  2:19 AM   Specimen: Anterior Nasal Swab  Result Value Ref Range Status   SARS Coronavirus 2 by RT PCR NEGATIVE NEGATIVE Final    Comment: (NOTE) SARS-CoV-2 target nucleic acids are NOT DETECTED.  The SARS-CoV-2 RNA is generally detectable in upper and lower respiratory specimens during the acute phase of infection. The lowest concentration of  SARS-CoV-2 viral copies this assay can detect is 250 copies / mL. A negative result does not preclude SARS-CoV-2 infection and should not be used as the sole basis for treatment or other patient management decisions.  A negative result may occur with improper specimen collection / handling, submission of specimen other than nasopharyngeal swab, presence of viral mutation(s) within the areas targeted by this assay, and inadequate number of viral copies (<250 copies / mL). A negative result must be combined with clinical observations, patient history, and epidemiological information.  Fact Sheet for Patients:   RoadLapTop.co.za  Fact Sheet for Healthcare Providers: http://kim-miller.com/  This test is not yet approved or  cleared by the [place] FDA and has been authorized for detection and/or diagnosis of SARS-CoV-2 by FDA under an Emergency Use Authorization (EUA).  This EUA will remain in effect (meaning this test can be used) for the duration of the COVID-19 declaration under Section 564(b)(1) of the Act, 21 U.S.C. section 360bbb-3(b)(1), unless the authorization is terminated or revoked sooner.  Performed at [facility], [address] [place], [place] [address]     Time spent: 45 minutes  Signed: Lorin Glass, MD 2022/09/12

## 2022-09-19 NOTE — Progress Notes (Signed)
Late entry for 0142  2 RN verification for cardiac time of death competed with [place] RN. No heart tones auscultated, no respirations visualized. Patient's telemetry ECG reading asystole. Patient's husband [name] at bedside, notified that patient expired.   Additional family called in. Funeral home arrangements shared and entered in post-mortem flow sheet.   No belongings at bedside. Patient's husband stated he took everything with him.   Comfort hospitality cart provided for family and visitors. Family and visitors expressed sincere gratitude for the cares provided to the patient during her hospitalization and to this RN for the care the patient received overnight.

## 2022-09-19 DEATH — deceased

## 2022-10-01 ENCOUNTER — Ambulatory Visit: Payer: Medicare HMO | Admitting: Internal Medicine

## 2022-10-28 ENCOUNTER — Ambulatory Visit: Payer: Medicare HMO | Admitting: Family Medicine

## 2023-10-30 IMAGING — PT NM PET TUM IMG RESTAG (PS) SKULL BASE T - THIGH
7 series · 25 of 25 positions shown · non-contrast
Comparison: 11/03/2020 chest CT.  Remote PET of 07/24/2010

CLINICAL DATA: Initial treatment strategy for history of
non-small-cell lung cancer, status post left-sided Vats. Recent
chest CT demonstrating a subpleural 9 mm left lung lesion.

EXAM:
NUCLEAR MEDICINE PET SKULL BASE TO THIGH
TECHNIQUE: 10.5 mCi F-18 FDG was injected intravenously. Full-ring PET imaging
was performed from the skull base to thigh after the radiotracer. CT
data was obtained and used for attenuation correction and anatomic
localization.
Fasting blood glucose: 102 mg/dl

[Series 3: pet sk_thigh ac · axial · 5.0mm · 4.07mm/px · z∈[-1546,-674]mm · 6 of 219 slices shown]
[im 1/219]
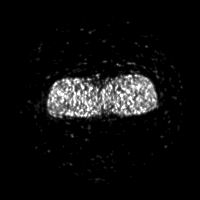
[im 44/219]
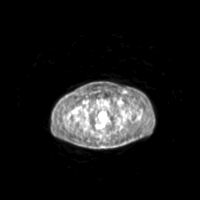
[im 88/219]
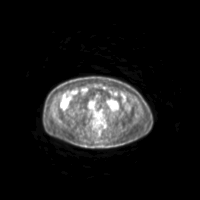
[im 131/219]
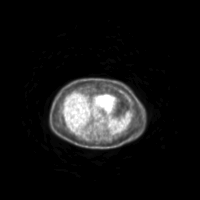
[im 175/219]
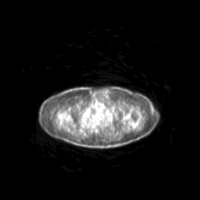
[im 219/219]
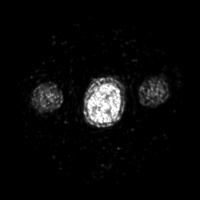

[Series 4: ct sk_thigh 5.0 bf37 · axial · 5.0mm · 0.98mm/px · z∈[-1546,-674]mm · 6 of 219 slices shown]
[im 1/219]
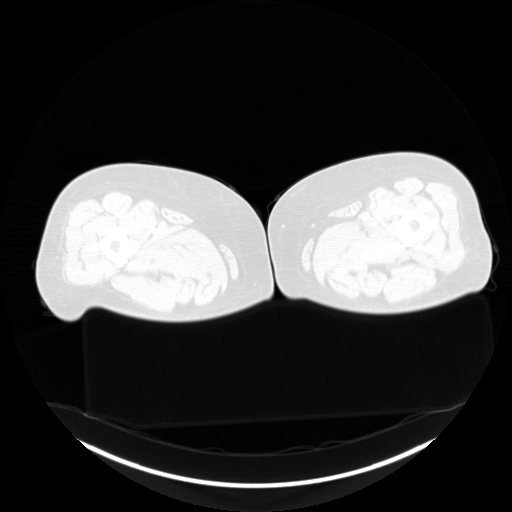
[im 44/219]
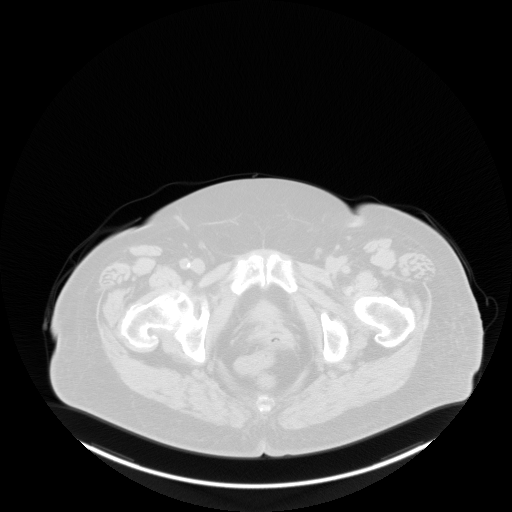
[im 88/219]
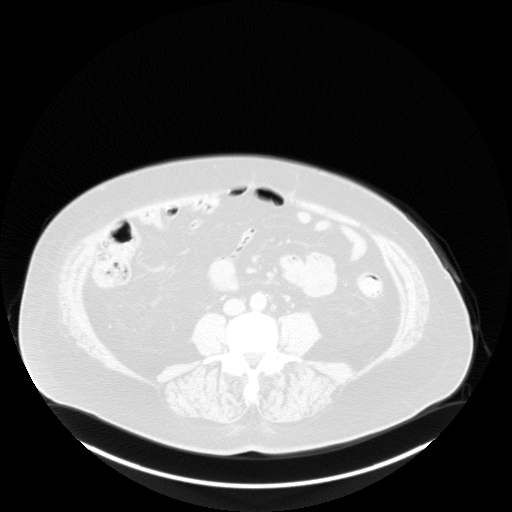
[im 131/219]
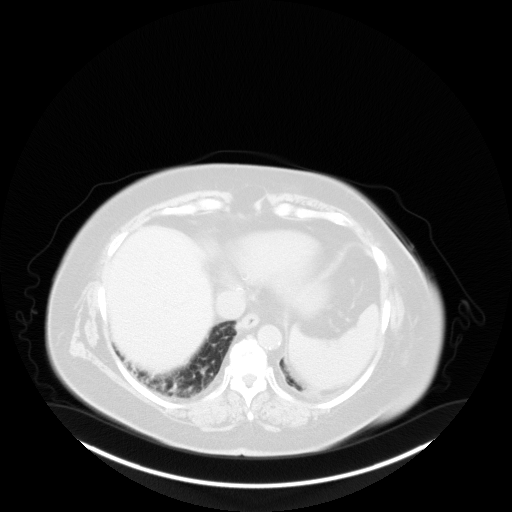
[im 175/219]
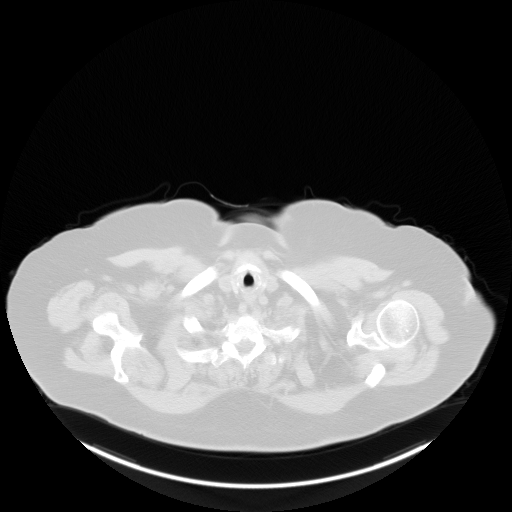
[im 219/219  brain]
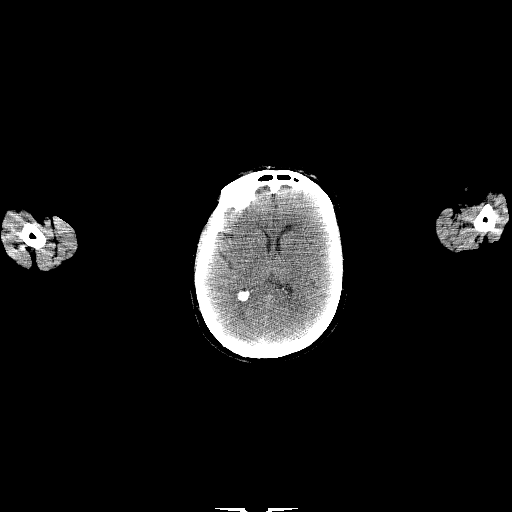

[Series 5: pet sk_thigh nac · axial · 5.0mm · 4.07mm/px · z∈[-1546,-674]mm · 5 of 219 slices shown]
[im 1/219]
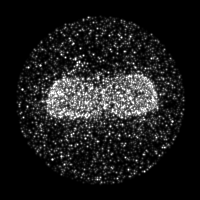
[im 55/219]
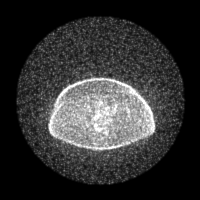
[im 110/219]
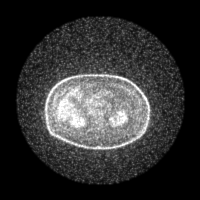
[im 164/219]
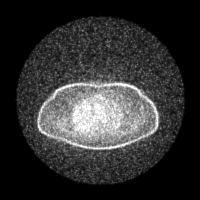
[im 219/219]
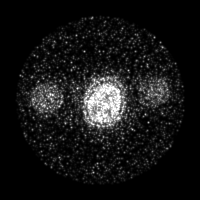

[Series 8: ct sk_thigh 5.0 br59 lung_bone · axial · 5.0mm · 0.65mm/px · 1 of 55 slices shown]
[im 1/55  brain]
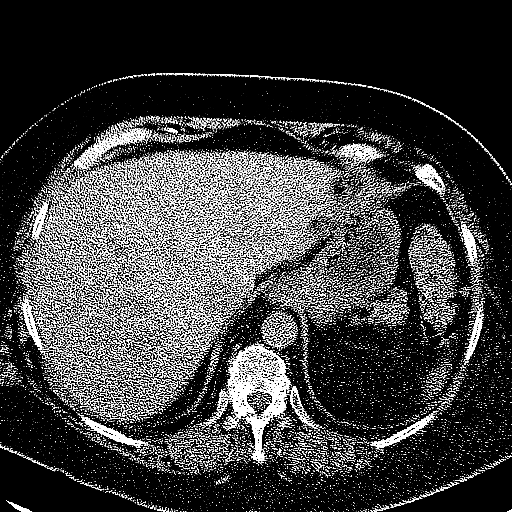

[Series 603: fused cor · 1 of 51 slices shown]
[im 1/51]
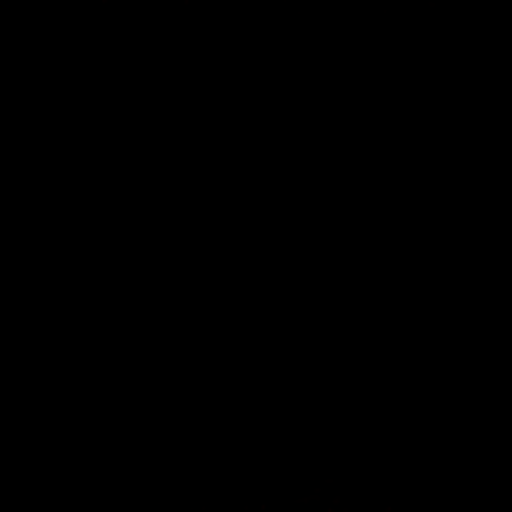

[Series 604: <mip collection> · coronal · 1.81mm/px · 1 of 32 slices shown]
[im 1/32]
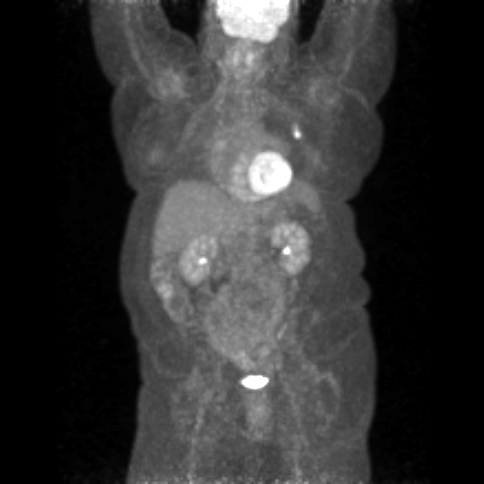

[Series 605: range-ct sk_thigh 5.0 bf37-tra-<alpha range> · 5 of 213 slices shown]
[im 1/213]
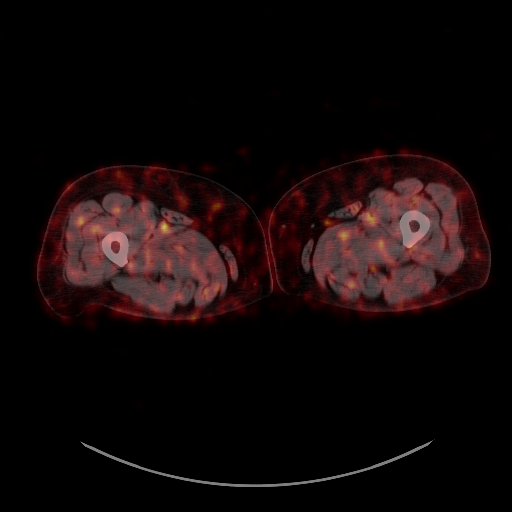
[im 54/213]
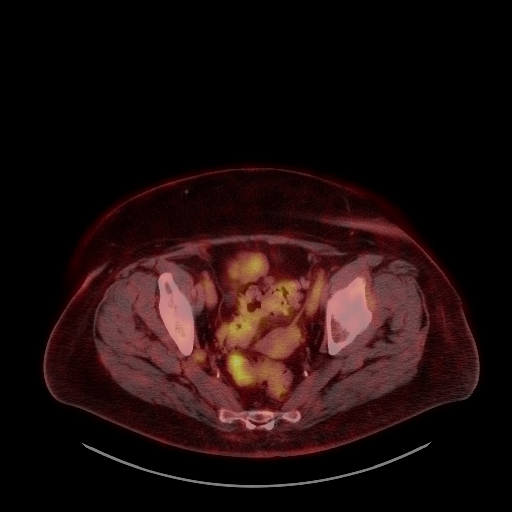
[im 107/213]
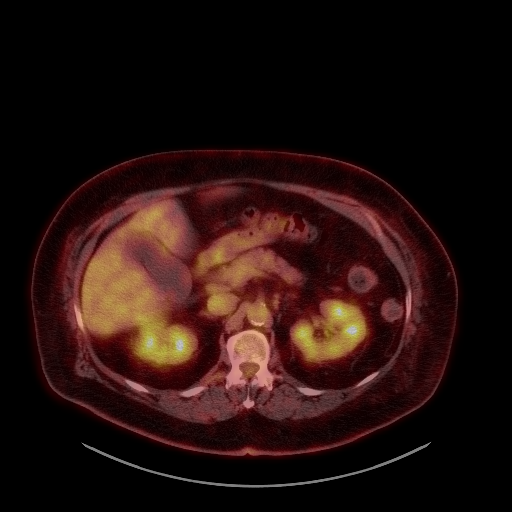
[im 160/213]
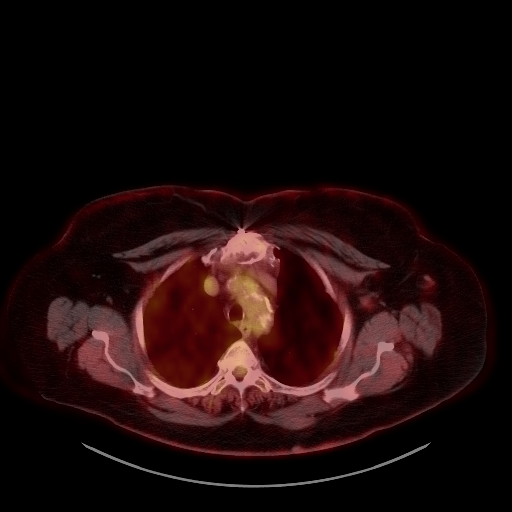
[im 213/213]
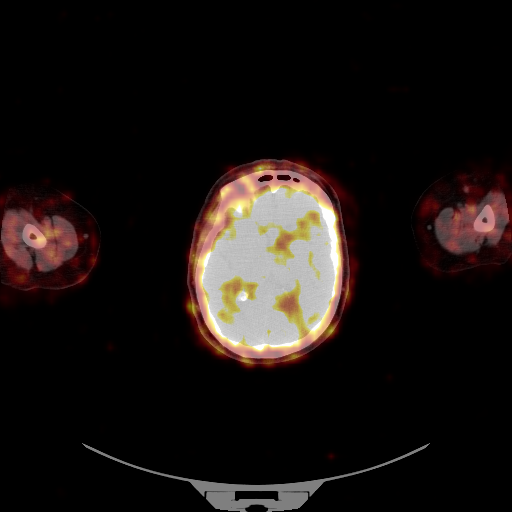

[25 of 25 positions shown; findings below may reference images not displayed]

FINDINGS: Mediastinal blood pool activity: SUV max

Liver activity: SUV max NA

NECK: No areas of abnormal hypermetabolism.

Incidental CT findings: Bilateral carotid atherosclerosis. No
cervical adenopathy.

CHEST: The pleural-based left lower lobe in this patient who is
status post at least partial left upper lobectomy, measures 12 mm
and a S.U.V. max of 11.3 on 62/4. Felt to be slightly enlarged
compared to 9 mm on 11/03/2020.

No thoracic nodal hypermetabolism.

Incidental CT findings: Moderate cardiomegaly. Median sternotomy for
CABG. Aortic atherosclerosis. Right pulmonary artery enlargement,
including 3.0 cm. Centrilobular emphysema.

ABDOMEN/PELVIS: No abdominopelvic parenchymal or nodal
hypermetabolism.

Incidental CT findings: Normal adrenal glands. Bilateral renal
vascular calcification. Left hepatic lobe 1.4 cm cyst. Scattered
colonic diverticula. Pelvic floor laxity.

SKELETON: No abnormal marrow activity.

Incidental CT findings: Left rib deformities are likely post
surgical.
IMPRESSION: 1. Hypermetabolism corresponding to the left lower lobe pulmonary
nodule, most consistent with metachronous primary bronchogenic
carcinoma.
2. No evidence of thoracic nodal or hypermetabolic extrathoracic
metastasis.
3. Incidental findings, including: Aortic atherosclerosis
([id number].F) and emphysema ([id number].I). Pulmonary artery
enlargement suggests pulmonary arterial hypertension.
